# Patient Record
Sex: Female | Born: 1945 | Race: Black or African American | Hispanic: No | Marital: Married | Smoking: Never smoker
Health system: Southern US, Community
[De-identification: ages and names within clinical notes are randomized; demographics above are authoritative.]

## PROBLEM LIST (undated history)

## (undated) DIAGNOSIS — B0229 Other postherpetic nervous system involvement: Secondary | ICD-10-CM

## (undated) DIAGNOSIS — E785 Hyperlipidemia, unspecified: Secondary | ICD-10-CM

## (undated) DIAGNOSIS — Z7902 Long term (current) use of antithrombotics/antiplatelets: Secondary | ICD-10-CM

## (undated) DIAGNOSIS — N183 Chronic kidney disease, stage 3 unspecified: Secondary | ICD-10-CM

## (undated) DIAGNOSIS — M1990 Unspecified osteoarthritis, unspecified site: Secondary | ICD-10-CM

## (undated) DIAGNOSIS — K449 Diaphragmatic hernia without obstruction or gangrene: Secondary | ICD-10-CM

## (undated) DIAGNOSIS — Z972 Presence of dental prosthetic device (complete) (partial): Secondary | ICD-10-CM

## (undated) DIAGNOSIS — I5030 Unspecified diastolic (congestive) heart failure: Secondary | ICD-10-CM

## (undated) DIAGNOSIS — E133399 Other specified diabetes mellitus with moderate nonproliferative diabetic retinopathy without macular edema, unspecified eye: Secondary | ICD-10-CM

## (undated) DIAGNOSIS — I429 Cardiomyopathy, unspecified: Secondary | ICD-10-CM

## (undated) DIAGNOSIS — K5792 Diverticulitis of intestine, part unspecified, without perforation or abscess without bleeding: Secondary | ICD-10-CM

## (undated) DIAGNOSIS — E1140 Type 2 diabetes mellitus with diabetic neuropathy, unspecified: Secondary | ICD-10-CM

## (undated) DIAGNOSIS — B029 Zoster without complications: Secondary | ICD-10-CM

## (undated) DIAGNOSIS — Z794 Long term (current) use of insulin: Secondary | ICD-10-CM

## (undated) DIAGNOSIS — D869 Sarcoidosis, unspecified: Secondary | ICD-10-CM

## (undated) DIAGNOSIS — K219 Gastro-esophageal reflux disease without esophagitis: Secondary | ICD-10-CM

## (undated) DIAGNOSIS — I10 Essential (primary) hypertension: Secondary | ICD-10-CM

## (undated) DIAGNOSIS — E119 Type 2 diabetes mellitus without complications: Secondary | ICD-10-CM

## (undated) DIAGNOSIS — D631 Anemia in chronic kidney disease: Secondary | ICD-10-CM

## (undated) DIAGNOSIS — N184 Chronic kidney disease, stage 4 (severe): Secondary | ICD-10-CM

## (undated) DIAGNOSIS — Z8719 Personal history of other diseases of the digestive system: Secondary | ICD-10-CM

## (undated) DIAGNOSIS — I639 Cerebral infarction, unspecified: Secondary | ICD-10-CM

## (undated) HISTORY — DX: Essential (primary) hypertension: I10

## (undated) HISTORY — DX: Chronic kidney disease, stage 3 (moderate): N18.3

## (undated) HISTORY — DX: Unspecified diastolic (congestive) heart failure: I50.30

## (undated) HISTORY — DX: Chronic kidney disease, stage 3 unspecified: N18.30

## (undated) HISTORY — DX: Cerebral infarction, unspecified: I63.9

## (undated) HISTORY — DX: Hyperlipidemia, unspecified: E78.5

## (undated) HISTORY — PX: EYE SURGERY: SHX253

## (undated) HISTORY — PX: TUBAL LIGATION: SHX77

## (undated) HISTORY — DX: Type 2 diabetes mellitus without complications: E11.9

---

## 2006-12-15 HISTORY — PX: COLONOSCOPY: SHX174

## 2011-05-21 ENCOUNTER — Ambulatory Visit: Payer: Self-pay | Admitting: Family Medicine

## 2011-05-28 ENCOUNTER — Ambulatory Visit: Payer: Self-pay | Admitting: Family Medicine

## 2011-12-24 ENCOUNTER — Ambulatory Visit: Payer: Self-pay | Admitting: Family Medicine

## 2012-01-19 ENCOUNTER — Ambulatory Visit: Payer: Self-pay | Admitting: Family Medicine

## 2012-10-31 ENCOUNTER — Ambulatory Visit: Payer: Self-pay | Admitting: Physician Assistant

## 2012-10-31 LAB — URINALYSIS, COMPLETE
Bilirubin,UR: NEGATIVE
Blood: NEGATIVE
Ketone: NEGATIVE
Leukocyte Esterase: NEGATIVE
Nitrite: NEGATIVE
Ph: 5
RBC,UR: NONE SEEN /HPF
Specific Gravity: 1.015

## 2012-11-01 LAB — URINE CULTURE

## 2014-03-13 ENCOUNTER — Ambulatory Visit: Payer: Self-pay

## 2014-03-13 DIAGNOSIS — R0602 Shortness of breath: Secondary | ICD-10-CM | POA: Diagnosis not present

## 2014-03-17 ENCOUNTER — Ambulatory Visit: Payer: Self-pay

## 2014-06-05 DIAGNOSIS — N183 Chronic kidney disease, stage 3 unspecified: Secondary | ICD-10-CM | POA: Diagnosis not present

## 2014-06-05 DIAGNOSIS — R319 Hematuria, unspecified: Secondary | ICD-10-CM | POA: Diagnosis not present

## 2014-06-05 DIAGNOSIS — E119 Type 2 diabetes mellitus without complications: Secondary | ICD-10-CM | POA: Diagnosis not present

## 2014-06-05 DIAGNOSIS — N039 Chronic nephritic syndrome with unspecified morphologic changes: Secondary | ICD-10-CM | POA: Diagnosis not present

## 2014-06-05 DIAGNOSIS — D631 Anemia in chronic kidney disease: Secondary | ICD-10-CM | POA: Diagnosis not present

## 2014-06-05 DIAGNOSIS — I10 Essential (primary) hypertension: Secondary | ICD-10-CM | POA: Diagnosis not present

## 2014-06-05 DIAGNOSIS — R809 Proteinuria, unspecified: Secondary | ICD-10-CM | POA: Diagnosis not present

## 2014-06-15 DIAGNOSIS — N183 Chronic kidney disease, stage 3 unspecified: Secondary | ICD-10-CM | POA: Diagnosis not present

## 2014-06-22 DIAGNOSIS — E1139 Type 2 diabetes mellitus with other diabetic ophthalmic complication: Secondary | ICD-10-CM | POA: Diagnosis not present

## 2014-07-20 DIAGNOSIS — IMO0001 Reserved for inherently not codable concepts without codable children: Secondary | ICD-10-CM | POA: Diagnosis not present

## 2014-07-20 DIAGNOSIS — N289 Disorder of kidney and ureter, unspecified: Secondary | ICD-10-CM | POA: Diagnosis not present

## 2014-08-04 DIAGNOSIS — N058 Unspecified nephritic syndrome with other morphologic changes: Secondary | ICD-10-CM | POA: Diagnosis not present

## 2014-08-04 DIAGNOSIS — E1129 Type 2 diabetes mellitus with other diabetic kidney complication: Secondary | ICD-10-CM | POA: Diagnosis not present

## 2014-08-04 DIAGNOSIS — R809 Proteinuria, unspecified: Secondary | ICD-10-CM | POA: Diagnosis not present

## 2014-08-04 DIAGNOSIS — I10 Essential (primary) hypertension: Secondary | ICD-10-CM | POA: Diagnosis not present

## 2014-08-04 DIAGNOSIS — E1165 Type 2 diabetes mellitus with hyperglycemia: Secondary | ICD-10-CM | POA: Diagnosis not present

## 2015-05-01 DIAGNOSIS — N184 Chronic kidney disease, stage 4 (severe): Secondary | ICD-10-CM | POA: Insufficient documentation

## 2015-05-01 DIAGNOSIS — E1169 Type 2 diabetes mellitus with other specified complication: Secondary | ICD-10-CM | POA: Insufficient documentation

## 2015-05-01 DIAGNOSIS — E785 Hyperlipidemia, unspecified: Secondary | ICD-10-CM

## 2015-05-01 DIAGNOSIS — E1122 Type 2 diabetes mellitus with diabetic chronic kidney disease: Secondary | ICD-10-CM | POA: Diagnosis not present

## 2015-05-01 DIAGNOSIS — Z794 Long term (current) use of insulin: Secondary | ICD-10-CM | POA: Insufficient documentation

## 2015-05-01 DIAGNOSIS — I129 Hypertensive chronic kidney disease with stage 1 through stage 4 chronic kidney disease, or unspecified chronic kidney disease: Secondary | ICD-10-CM | POA: Diagnosis not present

## 2015-05-01 DIAGNOSIS — E1165 Type 2 diabetes mellitus with hyperglycemia: Principal | ICD-10-CM

## 2015-05-01 DIAGNOSIS — E1140 Type 2 diabetes mellitus with diabetic neuropathy, unspecified: Secondary | ICD-10-CM

## 2015-05-07 DIAGNOSIS — E1122 Type 2 diabetes mellitus with diabetic chronic kidney disease: Secondary | ICD-10-CM | POA: Diagnosis not present

## 2015-05-07 DIAGNOSIS — R809 Proteinuria, unspecified: Secondary | ICD-10-CM | POA: Diagnosis not present

## 2015-05-07 DIAGNOSIS — I129 Hypertensive chronic kidney disease with stage 1 through stage 4 chronic kidney disease, or unspecified chronic kidney disease: Secondary | ICD-10-CM | POA: Diagnosis not present

## 2015-05-07 DIAGNOSIS — N183 Chronic kidney disease, stage 3 (moderate): Secondary | ICD-10-CM | POA: Diagnosis not present

## 2015-05-15 DIAGNOSIS — N183 Chronic kidney disease, stage 3 (moderate): Secondary | ICD-10-CM | POA: Diagnosis not present

## 2015-05-15 DIAGNOSIS — E1129 Type 2 diabetes mellitus with other diabetic kidney complication: Secondary | ICD-10-CM | POA: Diagnosis not present

## 2015-05-15 DIAGNOSIS — R809 Proteinuria, unspecified: Secondary | ICD-10-CM | POA: Diagnosis not present

## 2015-05-15 DIAGNOSIS — I10 Essential (primary) hypertension: Secondary | ICD-10-CM | POA: Diagnosis not present

## 2015-07-16 DIAGNOSIS — R809 Proteinuria, unspecified: Secondary | ICD-10-CM | POA: Diagnosis not present

## 2015-07-16 DIAGNOSIS — I129 Hypertensive chronic kidney disease with stage 1 through stage 4 chronic kidney disease, or unspecified chronic kidney disease: Secondary | ICD-10-CM | POA: Diagnosis not present

## 2015-07-16 DIAGNOSIS — N183 Chronic kidney disease, stage 3 (moderate): Secondary | ICD-10-CM | POA: Diagnosis not present

## 2015-08-07 DIAGNOSIS — D631 Anemia in chronic kidney disease: Secondary | ICD-10-CM | POA: Diagnosis not present

## 2015-08-07 DIAGNOSIS — R809 Proteinuria, unspecified: Secondary | ICD-10-CM | POA: Diagnosis not present

## 2015-08-07 DIAGNOSIS — N2581 Secondary hyperparathyroidism of renal origin: Secondary | ICD-10-CM | POA: Diagnosis not present

## 2015-08-07 DIAGNOSIS — I10 Essential (primary) hypertension: Secondary | ICD-10-CM | POA: Diagnosis not present

## 2015-08-07 DIAGNOSIS — N183 Chronic kidney disease, stage 3 (moderate): Secondary | ICD-10-CM | POA: Diagnosis not present

## 2015-08-07 DIAGNOSIS — E1122 Type 2 diabetes mellitus with diabetic chronic kidney disease: Secondary | ICD-10-CM | POA: Diagnosis not present

## 2015-08-15 DIAGNOSIS — Z9114 Patient's other noncompliance with medication regimen: Secondary | ICD-10-CM | POA: Diagnosis not present

## 2015-08-15 DIAGNOSIS — N183 Chronic kidney disease, stage 3 (moderate): Secondary | ICD-10-CM | POA: Diagnosis not present

## 2015-08-15 DIAGNOSIS — E1129 Type 2 diabetes mellitus with other diabetic kidney complication: Secondary | ICD-10-CM | POA: Diagnosis not present

## 2015-08-15 DIAGNOSIS — E1165 Type 2 diabetes mellitus with hyperglycemia: Secondary | ICD-10-CM | POA: Diagnosis not present

## 2015-08-21 ENCOUNTER — Encounter: Payer: Self-pay | Admitting: Unknown Physician Specialty

## 2015-08-21 ENCOUNTER — Ambulatory Visit (INDEPENDENT_AMBULATORY_CARE_PROVIDER_SITE_OTHER): Payer: Medicare Other | Admitting: Unknown Physician Specialty

## 2015-08-21 VITALS — BP 139/85 | HR 76 | Temp 98.1°F | Ht 65.5 in | Wt 153.4 lb

## 2015-08-21 DIAGNOSIS — N183 Chronic kidney disease, stage 3 (moderate): Secondary | ICD-10-CM

## 2015-08-21 DIAGNOSIS — N189 Chronic kidney disease, unspecified: Secondary | ICD-10-CM | POA: Diagnosis not present

## 2015-08-21 DIAGNOSIS — E1165 Type 2 diabetes mellitus with hyperglycemia: Secondary | ICD-10-CM

## 2015-08-21 DIAGNOSIS — N184 Chronic kidney disease, stage 4 (severe): Secondary | ICD-10-CM | POA: Diagnosis not present

## 2015-08-21 DIAGNOSIS — N185 Chronic kidney disease, stage 5: Secondary | ICD-10-CM

## 2015-08-21 DIAGNOSIS — F419 Anxiety disorder, unspecified: Principal | ICD-10-CM

## 2015-08-21 DIAGNOSIS — F418 Other specified anxiety disorders: Secondary | ICD-10-CM

## 2015-08-21 DIAGNOSIS — IMO0002 Reserved for concepts with insufficient information to code with codable children: Secondary | ICD-10-CM

## 2015-08-21 DIAGNOSIS — N181 Chronic kidney disease, stage 1: Secondary | ICD-10-CM | POA: Diagnosis not present

## 2015-08-21 DIAGNOSIS — M545 Low back pain, unspecified: Secondary | ICD-10-CM

## 2015-08-21 DIAGNOSIS — F329 Major depressive disorder, single episode, unspecified: Secondary | ICD-10-CM

## 2015-08-21 DIAGNOSIS — F32A Depression, unspecified: Secondary | ICD-10-CM | POA: Insufficient documentation

## 2015-08-21 DIAGNOSIS — N182 Chronic kidney disease, stage 2 (mild): Secondary | ICD-10-CM | POA: Diagnosis not present

## 2015-08-21 DIAGNOSIS — I129 Hypertensive chronic kidney disease with stage 1 through stage 4 chronic kidney disease, or unspecified chronic kidney disease: Secondary | ICD-10-CM | POA: Insufficient documentation

## 2015-08-21 NOTE — Assessment & Plan Note (Signed)
Has returned to endocrinology for management.

## 2015-08-21 NOTE — Assessment & Plan Note (Signed)
Stable at present.   

## 2015-08-21 NOTE — Assessment & Plan Note (Signed)
Followed by Nephrology. Postive proteinuria. Labs pending.

## 2015-08-21 NOTE — Progress Notes (Signed)
BP 139/85 mmHg  Pulse 76  Temp(Src) 98.1 F (36.7 C)  Ht 5' 5.5" (1.664 m)  Wt 153 lb 6.4 oz (69.582 kg)  BMI 25.13 kg/m2  SpO2 97%  LMP  (LMP Unknown)   Subjective:    Patient ID: Tonya [name], female    DOB: Jan 01, 1946, 69 y.o.   MRN: [id number]  HPI: Tonya [name] is a 69 y.o. female  Chief Complaint  Patient presents with  . Diabetes  . Hyperlipidemia  . Hypertension  . Chronic Kidney Disease   Diabetes: Patient has returned to her endocrinologist for management. She was out of insulin prior to seeing Endocrinology therefore a A1C was not drawn at that time. She returns to them in one month. She does not monitor blood sugars at home.   Hypertension/Hyperlipidemia: Blood pressure well controlled. Taking medication as prescribed. Does not monitor blood pressure at home. Denies chest pain, headaches or shortness of breath.  Chronic Kidney Disease: She was seen by nephrology about 3-4 weeks ago. Per patient labs are improved, GFR at 57.   Relevant past medical, surgical, family and social history reviewed and updated as indicated. Interim medical history since our last visit reviewed. Allergies and medications reviewed and updated.  Review of Systems  Constitutional: Negative.  Negative for activity change and appetite change.  HENT: Negative.   Respiratory: Negative.  Negative for cough, chest tightness, shortness of breath, wheezing and stridor.   Cardiovascular: Negative.  Negative for chest pain, palpitations and leg swelling.  Skin: Negative.  Negative for color change, pallor, rash and wound.  Psychiatric/Behavioral: Negative for confusion. The patient is not nervous/anxious.     Per HPI unless specifically indicated above     Objective:    BP 139/85 mmHg  Pulse 76  Temp(Src) 98.1 F (36.7 C)  Ht 5' 5.5" (1.664 m)  Wt 153 lb 6.4 oz (69.582 kg)  BMI 25.13 kg/m2  SpO2 97%  LMP  (LMP Unknown)  Wt Readings from Last 3 Encounters:  08/21/15 153 lb 6.4  oz (69.582 kg)  05/01/15 153 lb (69.4 kg)    Physical Exam  Constitutional: She is oriented to person, place, and time. She appears well-developed and well-nourished. No distress.  HENT:  Head: Normocephalic and atraumatic.  Neck: Normal range of motion.  Cardiovascular: Normal rate, regular rhythm and normal heart sounds.  Exam reveals no gallop and no friction rub.   No murmur heard. Pulmonary/Chest: Effort normal and breath sounds normal. No respiratory distress. She has no wheezes. She has no rales. She exhibits no tenderness.  Neurological: She is alert and oriented to person, place, and time.  Skin: Skin is warm and dry. No rash noted. She is not diaphoretic. No erythema. No pallor.  Psychiatric: She has a normal mood and affect. Her behavior is normal. Judgment and thought content normal.        Assessment & Plan:   Problem List Items Addressed This Visit      Unprioritized   Diabetes mellitus type 2, uncontrolled    Has returned to endocrinology for management.      Relevant Medications   valsartan (DIOVAN) 160 MG tablet   aspirin 81 MG tablet   pravastatin (PRAVACHOL) 40 MG tablet   insulin lispro (HUMALOG) 100 UNIT/ML KiwkPen   Insulin Glargine (TOUJEO SOLOSTAR) 300 UNIT/ML SOPN   Anxiety and depression - Primary    Stable at present.      Hypertensive CKD (chronic kidney disease)    Followed  by Nephrology. Postive proteinuria. Labs pending.       Other Visit Diagnoses    Left-sided low back pain without sciatica        Will send for x-ray and pain management    Relevant Medications    aspirin 81 MG tablet    Other Relevant Orders    DG Lumbar Spine Complete    Ambulatory referral to Pain Clinic        Follow up plan: Return in about 3 months (around 11/20/2015).

## 2015-08-23 ENCOUNTER — Ambulatory Visit
Admission: RE | Admit: 2015-08-23 | Discharge: 2015-08-23 | Disposition: A | Discharge status: Home / Self Care | Payer: Medicare Other | Source: Ambulatory Visit | Attending: Unknown Physician Specialty | Admitting: Unknown Physician Specialty

## 2015-08-23 DIAGNOSIS — M47816 Spondylosis without myelopathy or radiculopathy, lumbar region: Secondary | ICD-10-CM | POA: Diagnosis not present

## 2015-08-23 DIAGNOSIS — I700 Atherosclerosis of aorta: Secondary | ICD-10-CM | POA: Diagnosis not present

## 2015-08-23 DIAGNOSIS — M545 Low back pain, unspecified: Secondary | ICD-10-CM

## 2015-08-23 DIAGNOSIS — K5900 Constipation, unspecified: Secondary | ICD-10-CM | POA: Diagnosis not present

## 2015-08-24 ENCOUNTER — Encounter: Payer: Self-pay | Admitting: Unknown Physician Specialty

## 2015-09-18 ENCOUNTER — Encounter: Payer: Self-pay | Admitting: Unknown Physician Specialty

## 2015-09-18 ENCOUNTER — Ambulatory Visit (INDEPENDENT_AMBULATORY_CARE_PROVIDER_SITE_OTHER): Payer: Medicare Other | Admitting: Unknown Physician Specialty

## 2015-09-18 VITALS — BP 165/89 | HR 68 | Temp 98.2°F | Ht 65.2 in | Wt 150.6 lb

## 2015-09-18 DIAGNOSIS — Z23 Encounter for immunization: Secondary | ICD-10-CM | POA: Diagnosis not present

## 2015-09-18 DIAGNOSIS — M7072 Other bursitis of hip, left hip: Secondary | ICD-10-CM

## 2015-09-18 DIAGNOSIS — M7070 Other bursitis of hip, unspecified hip: Secondary | ICD-10-CM | POA: Insufficient documentation

## 2015-09-18 MED ORDER — LIDOCAINE 5 % EX OINT: 1.0000 "application " | TOPICAL_OINTMENT | CUTANEOUS | Status: DC | PRN

## 2015-09-18 NOTE — Patient Instructions (Addendum)
Hip Bursitis Bursitis is a puffiness (swelling) and soreness of a fluid-filled sac (bursa). This sac covers and protects the joint. HOME CARE  Put ice on the injured area.  Put ice in a plastic bag.  Place a towel between your skin and the bag.  Leave the ice on for 15-20 minutes, 03-04 times a day.  Rest the painful joint as much as possible. Move your joint at least 4 times a day. When pain lessens, start normal, slow movements and normal activities.  Only take medicine as told by your doctor.  Use crutches as told.  Raise (elevate) your painful joint. Use pillows for propping your legs and hips.  Get a massage to lessen pain. GET HELP RIGHT AWAY IF:  Your pain increases or does not improve during treatment.  You have a fever.  You feel heat coming from the affected area.  You see redness and puffiness around the affected area.  You have any questions or concerns. MAKE SURE YOU:  Understand these instructions.  Will watch your condition.  Will get help right away if you are not well or get worse. Document Released: 01/03/2011 Document Revised: 02/23/2012 Document Reviewed: 01/03/2011 [facility] Patient Information 2015 [place], [place]. This information is not intended to replace advice given to you by your health care provider. Make sure you discuss any questions you have with your health care provider.

## 2015-09-18 NOTE — Progress Notes (Signed)
BP 165/89 mmHg  Pulse 68  Temp(Src) 98.2 F (36.8 C)  Ht 5' 5.2" (1.656 m)  Wt 150 lb 9.6 oz (68.312 kg)  BMI 24.91 kg/m2  SpO2 100%  LMP  (LMP Unknown)   Subjective:    Patient ID: Tonya [name], female    DOB: 1945/12/17, 69 y.o.   MRN: [id number]  HPI: CIYA FRANCKOWIAK is a 69 y.o. female  Chief Complaint  Patient presents with  . Leg Pain    pt states she has had pain in left leg for about 3 weeks now, states pain is like a burning pain. states it is causing her to not be able to sleep at night.   Hip Bursitis: Pain began about three weeks ago to left hip and radiating down anterior thigh. She can not lay on left side at all. Has tried aspirin 81mg  about a week ago and has been using a topical over the counter pain relief cream with some relief. Pain is slightly improving but still significant discomfort is present. She has similar pain to the right side about one month ago and was sent for a lumbar xray which showed degenerative changes. The right side still hurts but is much better today.    Relevant past medical, surgical, family and social history reviewed and updated as indicated. Interim medical history since our last visit reviewed. Allergies and medications reviewed and updated.  Review of Systems  Constitutional: Negative.  Negative for fever, chills, activity change and appetite change.  HENT: Negative.  Negative for congestion, postnasal drip, rhinorrhea and sore throat.   Eyes: Negative.  Negative for discharge and redness.  Respiratory: Negative.  Negative for cough, chest tightness, shortness of breath and wheezing.   Cardiovascular: Negative.  Negative for chest pain, palpitations and leg swelling.  Gastrointestinal: Negative.  Negative for diarrhea and constipation.  Musculoskeletal: Positive for back pain and arthralgias. Negative for gait problem and neck pain.  Skin: Negative.  Negative for color change, pallor, rash and wound.  Neurological: Negative for  dizziness, seizures, weakness, light-headedness and headaches.  Psychiatric/Behavioral: Negative.  Negative for confusion, sleep disturbance and agitation. The patient is not nervous/anxious.     Per HPI unless specifically indicated above     Objective:    BP 165/89 mmHg  Pulse 68  Temp(Src) 98.2 F (36.8 C)  Ht 5' 5.2" (1.656 m)  Wt 150 lb 9.6 oz (68.312 kg)  BMI 24.91 kg/m2  SpO2 100%  LMP  (LMP Unknown)  Wt Readings from Last 3 Encounters:  09/18/15 150 lb 9.6 oz (68.312 kg)  08/21/15 153 lb 6.4 oz (69.582 kg)  05/01/15 153 lb (69.4 kg)    Physical Exam  Constitutional: She is oriented to person, place, and time. She appears well-developed and well-nourished. No distress.  HENT:  Head: Normocephalic and atraumatic.  Eyes: Conjunctivae are normal. Right eye exhibits no discharge. Left eye exhibits no discharge.  Neck: Normal range of motion.  Cardiovascular: Normal rate, regular rhythm and normal heart sounds.  Exam reveals no gallop and no friction rub.   No murmur heard. Pulmonary/Chest: Effort normal and breath sounds normal.  Musculoskeletal: Normal range of motion. She exhibits no edema or tenderness.  Neurological: She is alert and oriented to person, place, and time.  Skin: She is not diaphoretic.        Assessment & Plan:   Problem List Items Addressed This Visit      Unprioritized   Hip bursitis  Other Visit Diagnoses    Immunization due    -  Primary    Relevant Orders    Flu Vaccine QUAD 36+ mos PF IM (Fluarix & Fluzone Quad PF) (Completed)        Follow up plan: Return in about 4 weeks (around 10/16/2015) for Physical.

## 2015-10-26 ENCOUNTER — Encounter: Payer: Medicare Other | Admitting: Unknown Physician Specialty

## 2015-10-31 DIAGNOSIS — R809 Proteinuria, unspecified: Secondary | ICD-10-CM | POA: Diagnosis not present

## 2015-10-31 DIAGNOSIS — E1129 Type 2 diabetes mellitus with other diabetic kidney complication: Secondary | ICD-10-CM | POA: Diagnosis not present

## 2015-10-31 DIAGNOSIS — N183 Chronic kidney disease, stage 3 (moderate): Secondary | ICD-10-CM | POA: Diagnosis not present

## 2015-10-31 DIAGNOSIS — I10 Essential (primary) hypertension: Secondary | ICD-10-CM | POA: Diagnosis not present

## 2015-10-31 DIAGNOSIS — E1165 Type 2 diabetes mellitus with hyperglycemia: Secondary | ICD-10-CM | POA: Diagnosis not present

## 2015-11-06 DIAGNOSIS — I129 Hypertensive chronic kidney disease with stage 1 through stage 4 chronic kidney disease, or unspecified chronic kidney disease: Secondary | ICD-10-CM | POA: Diagnosis not present

## 2015-11-06 DIAGNOSIS — N183 Chronic kidney disease, stage 3 (moderate): Secondary | ICD-10-CM | POA: Diagnosis not present

## 2015-11-06 DIAGNOSIS — N2581 Secondary hyperparathyroidism of renal origin: Secondary | ICD-10-CM | POA: Diagnosis not present

## 2015-11-06 DIAGNOSIS — R809 Proteinuria, unspecified: Secondary | ICD-10-CM | POA: Diagnosis not present

## 2015-11-06 DIAGNOSIS — E1122 Type 2 diabetes mellitus with diabetic chronic kidney disease: Secondary | ICD-10-CM | POA: Diagnosis not present

## 2015-11-07 DIAGNOSIS — I10 Essential (primary) hypertension: Secondary | ICD-10-CM | POA: Diagnosis not present

## 2015-11-07 DIAGNOSIS — E1122 Type 2 diabetes mellitus with diabetic chronic kidney disease: Secondary | ICD-10-CM | POA: Diagnosis not present

## 2015-11-07 DIAGNOSIS — Z794 Long term (current) use of insulin: Secondary | ICD-10-CM | POA: Diagnosis not present

## 2015-11-07 DIAGNOSIS — E1165 Type 2 diabetes mellitus with hyperglycemia: Secondary | ICD-10-CM | POA: Diagnosis not present

## 2015-11-07 DIAGNOSIS — N183 Chronic kidney disease, stage 3 (moderate): Secondary | ICD-10-CM | POA: Diagnosis not present

## 2015-11-07 DIAGNOSIS — Z9114 Patient's other noncompliance with medication regimen: Secondary | ICD-10-CM | POA: Diagnosis not present

## 2015-11-21 ENCOUNTER — Ambulatory Visit (INDEPENDENT_AMBULATORY_CARE_PROVIDER_SITE_OTHER): Payer: Medicare Other | Admitting: Unknown Physician Specialty

## 2015-11-21 ENCOUNTER — Encounter: Payer: Self-pay | Admitting: Unknown Physician Specialty

## 2015-11-21 VITALS — BP 154/87 | HR 71 | Temp 97.5°F | Ht 66.2 in | Wt 151.4 lb

## 2015-11-21 DIAGNOSIS — M609 Myositis, unspecified: Secondary | ICD-10-CM | POA: Diagnosis not present

## 2015-11-21 DIAGNOSIS — M791 Myalgia: Secondary | ICD-10-CM

## 2015-11-21 DIAGNOSIS — Z794 Long term (current) use of insulin: Secondary | ICD-10-CM

## 2015-11-21 DIAGNOSIS — E1140 Type 2 diabetes mellitus with diabetic neuropathy, unspecified: Secondary | ICD-10-CM

## 2015-11-21 DIAGNOSIS — IMO0001 Reserved for inherently not codable concepts without codable children: Secondary | ICD-10-CM

## 2015-11-21 DIAGNOSIS — E1165 Type 2 diabetes mellitus with hyperglycemia: Secondary | ICD-10-CM | POA: Diagnosis not present

## 2015-11-21 DIAGNOSIS — I129 Hypertensive chronic kidney disease with stage 1 through stage 4 chronic kidney disease, or unspecified chronic kidney disease: Secondary | ICD-10-CM

## 2015-11-21 MED ORDER — LIDOCAINE 5 % EX OINT: 1.0000 "application " | TOPICAL_OINTMENT | CUTANEOUS | Status: DC | PRN

## 2015-11-21 MED ORDER — INSULIN PEN NEEDLE 30G X 8 MM MISC: 1.0000 | Freq: Three times a day (TID) | Status: DC

## 2015-11-21 NOTE — Progress Notes (Signed)
   BP 154/87 mmHg  Pulse 71  Temp(Src) 97.5 F (36.4 C)  Ht 5' 6.2" (1.681 m)  Wt 151 lb 6.4 oz (68.675 kg)  BMI 24.30 kg/m2  SpO2 95%  LMP  (LMP Unknown)   Subjective:    Patient ID: Tonya [name], female    DOB: 01/07/1946, 69 y.o.   MRN: [id number]  HPI: Tonya [name] is a 69 y.o. female  Chief Complaint  Patient presents with  . Diabetes  . Hyperlipidemia  . Hypertension  . Medication Refill    pt states she needs a refill on novofine needles and lidocaine ointment   Diabetes:  Seeing Endocrinology eye exam within last year  Hypertension:  Using medications without difficulty Average home BPs: Not checking   Using medication without problems or lightheadedness No chest pain with exertion or shortness of breath No Edema Seeing nephrology for associated CKD.  Plans on seeing them back for BP recheck.    Elevated Cholesterol: Using medications without problems: No Muscle aches:  Diet compliance: Exercise  Myalgias Persistant myalgias that she rubs Lidoderm ointment on.  She took one of her husband's pills which helped but unsure what it was.    Relevant past medical, surgical, family and social history reviewed and updated as indicated. Interim medical history since our last visit reviewed. Allergies and medications reviewed and updated.  Review of Systems  Per HPI unless specifically indicated above     Objective:    BP 154/87 mmHg  Pulse 71  Temp(Src) 97.5 F (36.4 C)  Ht 5' 6.2" (1.681 m)  Wt 151 lb 6.4 oz (68.675 kg)  BMI 24.30 kg/m2  SpO2 95%  LMP  (LMP Unknown)  Wt Readings from Last 3 Encounters:  11/21/15 151 lb 6.4 oz (68.675 kg)  09/18/15 150 lb 9.6 oz (68.312 kg)  08/21/15 153 lb 6.4 oz (69.582 kg)    Physical Exam  Constitutional: She is oriented to person, place, and time. She appears well-developed and well-nourished. No distress.  HENT:  Head: Normocephalic and atraumatic.  Eyes: Conjunctivae and lids are normal. Right eye  exhibits no discharge. Left eye exhibits no discharge. No scleral icterus.  Neck: Normal range of motion. Neck supple. No JVD present. Carotid bruit is not present.  Cardiovascular: Normal rate, regular rhythm and normal heart sounds.   Pulmonary/Chest: Effort normal and breath sounds normal.  Abdominal: Normal appearance. There is no splenomegaly or hepatomegaly.  Musculoskeletal: Normal range of motion.  Neurological: She is alert and oriented to person, place, and time.  Skin: Skin is warm, dry and intact. No rash noted. No pallor.  Psychiatric: She has a normal mood and affect. Her behavior is normal. Judgment and thought content normal.    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  Refill needles      Encounter for long-term (current) use of insulin ([place])    Per endocrine      Hypertensive CKD (chronic kidney disease)    Per nephrology      Myalgia and myositis    Pt with CKD.  Will continue with topical treatment          Follow up plan: Return in about 6 months (around 05/21/2016).

## 2015-11-21 NOTE — Assessment & Plan Note (Addendum)
Pt with CKD.  Will continue with topical treatment

## 2015-11-21 NOTE — Assessment & Plan Note (Signed)
Per endocrine.  Refill needles

## 2015-11-21 NOTE — Assessment & Plan Note (Signed)
Per endocrine  

## 2015-11-21 NOTE — Assessment & Plan Note (Signed)
Per nephrology 

## 2016-01-09 ENCOUNTER — Other Ambulatory Visit: Payer: Self-pay | Admitting: Unknown Physician Specialty

## 2016-02-08 DIAGNOSIS — Z794 Long term (current) use of insulin: Secondary | ICD-10-CM | POA: Diagnosis not present

## 2016-02-08 DIAGNOSIS — E1165 Type 2 diabetes mellitus with hyperglycemia: Secondary | ICD-10-CM | POA: Diagnosis not present

## 2016-02-11 DIAGNOSIS — E1165 Type 2 diabetes mellitus with hyperglycemia: Secondary | ICD-10-CM | POA: Diagnosis not present

## 2016-02-11 DIAGNOSIS — E782 Mixed hyperlipidemia: Secondary | ICD-10-CM | POA: Diagnosis not present

## 2016-02-11 DIAGNOSIS — Z794 Long term (current) use of insulin: Secondary | ICD-10-CM | POA: Diagnosis not present

## 2016-02-11 DIAGNOSIS — E1122 Type 2 diabetes mellitus with diabetic chronic kidney disease: Secondary | ICD-10-CM | POA: Diagnosis not present

## 2016-02-11 DIAGNOSIS — I10 Essential (primary) hypertension: Secondary | ICD-10-CM | POA: Diagnosis not present

## 2016-02-11 DIAGNOSIS — N183 Chronic kidney disease, stage 3 (moderate): Secondary | ICD-10-CM | POA: Diagnosis not present

## 2016-03-06 ENCOUNTER — Other Ambulatory Visit: Payer: Self-pay | Admitting: Unknown Physician Specialty

## 2016-03-13 DIAGNOSIS — L0231 Cutaneous abscess of buttock: Secondary | ICD-10-CM | POA: Diagnosis not present

## 2016-05-06 DIAGNOSIS — Z794 Long term (current) use of insulin: Secondary | ICD-10-CM | POA: Diagnosis not present

## 2016-05-06 DIAGNOSIS — E1165 Type 2 diabetes mellitus with hyperglycemia: Secondary | ICD-10-CM | POA: Diagnosis not present

## 2016-05-07 DIAGNOSIS — E113292 Type 2 diabetes mellitus with mild nonproliferative diabetic retinopathy without macular edema, left eye: Secondary | ICD-10-CM | POA: Diagnosis not present

## 2016-05-07 LAB — HM DIABETES EYE EXAM

## 2016-05-10 ENCOUNTER — Other Ambulatory Visit: Payer: Self-pay | Admitting: Unknown Physician Specialty

## 2016-05-13 DIAGNOSIS — Z9114 Patient's other noncompliance with medication regimen: Secondary | ICD-10-CM | POA: Diagnosis not present

## 2016-05-13 DIAGNOSIS — Z794 Long term (current) use of insulin: Secondary | ICD-10-CM | POA: Diagnosis not present

## 2016-05-13 DIAGNOSIS — I10 Essential (primary) hypertension: Secondary | ICD-10-CM | POA: Diagnosis not present

## 2016-05-13 DIAGNOSIS — E782 Mixed hyperlipidemia: Secondary | ICD-10-CM | POA: Diagnosis not present

## 2016-05-13 DIAGNOSIS — E1165 Type 2 diabetes mellitus with hyperglycemia: Secondary | ICD-10-CM | POA: Diagnosis not present

## 2016-05-21 ENCOUNTER — Ambulatory Visit: Payer: Medicare Other | Admitting: Unknown Physician Specialty

## 2016-07-02 ENCOUNTER — Other Ambulatory Visit: Payer: Self-pay | Admitting: Unknown Physician Specialty

## 2016-07-03 ENCOUNTER — Other Ambulatory Visit: Payer: Self-pay | Admitting: Unknown Physician Specialty

## 2016-08-06 DIAGNOSIS — I10 Essential (primary) hypertension: Secondary | ICD-10-CM | POA: Diagnosis not present

## 2016-08-06 DIAGNOSIS — E1165 Type 2 diabetes mellitus with hyperglycemia: Secondary | ICD-10-CM | POA: Diagnosis not present

## 2016-08-06 DIAGNOSIS — Z794 Long term (current) use of insulin: Secondary | ICD-10-CM | POA: Diagnosis not present

## 2016-08-06 LAB — HEMOGLOBIN A1C: HEMOGLOBIN A1C: 13.1

## 2016-08-19 ENCOUNTER — Telehealth: Payer: Self-pay | Admitting: Unknown Physician Specialty

## 2016-08-19 LAB — HM DIABETES EYE EXAM

## 2016-08-19 NOTE — Telephone Encounter (Signed)
Pt would like to know if she could get a sample for insulin lispro (HUMALOG) 100 UNIT/ML KiwkPen and Levemir

## 2016-08-19 NOTE — Telephone Encounter (Signed)
Called and spoke to patient because Levemir was not on current medication list. Patient stated that Dr. [name] put her on the levemir and that she was taking 20 units at bedtime. I told the patient that we did not have any samples of either of those medications. Patient was last seen in December of 2016 and was supposed to be seen in June 2017. So I got the patient an appointment scheduled for 09/10/16 and I told her that I would call her if we got any samples of the medications she was requesting. Will update patient's chart with levemir.

## 2016-08-20 DIAGNOSIS — E113311 Type 2 diabetes mellitus with moderate nonproliferative diabetic retinopathy with macular edema, right eye: Secondary | ICD-10-CM | POA: Diagnosis not present

## 2016-08-20 LAB — HM DIABETES EYE EXAM

## 2016-09-10 ENCOUNTER — Other Ambulatory Visit: Payer: Self-pay

## 2016-09-10 ENCOUNTER — Encounter: Payer: Self-pay | Admitting: Unknown Physician Specialty

## 2016-09-10 ENCOUNTER — Ambulatory Visit (INDEPENDENT_AMBULATORY_CARE_PROVIDER_SITE_OTHER): Payer: Medicare Other | Admitting: Unknown Physician Specialty

## 2016-09-10 VITALS — BP 189/95 | HR 65 | Temp 97.6°F | Ht 65.8 in | Wt 159.4 lb

## 2016-09-10 DIAGNOSIS — E1121 Type 2 diabetes mellitus with diabetic nephropathy: Secondary | ICD-10-CM | POA: Diagnosis not present

## 2016-09-10 DIAGNOSIS — Z794 Long term (current) use of insulin: Secondary | ICD-10-CM | POA: Diagnosis not present

## 2016-09-10 DIAGNOSIS — E1165 Type 2 diabetes mellitus with hyperglycemia: Secondary | ICD-10-CM | POA: Diagnosis not present

## 2016-09-10 DIAGNOSIS — N184 Chronic kidney disease, stage 4 (severe): Secondary | ICD-10-CM

## 2016-09-10 DIAGNOSIS — I129 Hypertensive chronic kidney disease with stage 1 through stage 4 chronic kidney disease, or unspecified chronic kidney disease: Secondary | ICD-10-CM

## 2016-09-10 DIAGNOSIS — Z23 Encounter for immunization: Secondary | ICD-10-CM

## 2016-09-10 DIAGNOSIS — E785 Hyperlipidemia, unspecified: Secondary | ICD-10-CM | POA: Diagnosis not present

## 2016-09-10 DIAGNOSIS — Z0000 Encounter for general adult medical examination without abnormal findings: Secondary | ICD-10-CM

## 2016-09-10 DIAGNOSIS — IMO0002 Reserved for concepts with insufficient information to code with codable children: Secondary | ICD-10-CM

## 2016-09-10 LAB — BAYER DCA HB A1C WAIVED: HB A1C: 13.3 % — AB (ref <7.0)

## 2016-09-10 LAB — HEMOGLOBIN A1C: Hemoglobin A1C: 13.3

## 2016-09-10 MED ORDER — VALSARTAN 160 MG PO TABS: 160.0000 mg | ORAL_TABLET | Freq: Every day | ORAL | 5 refills | Status: DC

## 2016-09-10 MED ORDER — PRAVASTATIN SODIUM 40 MG PO TABS: 40.0000 mg | ORAL_TABLET | Freq: Every day | ORAL | 5 refills | Status: DC

## 2016-09-10 MED ORDER — INSULIN DETEMIR 100 UNIT/ML FLEXPEN: 30.0000 [IU] | PEN_INJECTOR | Freq: Every day | SUBCUTANEOUS | 11 refills | Status: DC

## 2016-09-10 MED ORDER — OMEPRAZOLE 20 MG PO CPDR: 20.0000 mg | DELAYED_RELEASE_CAPSULE | Freq: Two times a day (BID) | ORAL | 5 refills | Status: DC

## 2016-09-10 MED ORDER — FUROSEMIDE 20 MG PO TABS: 20.0000 mg | ORAL_TABLET | Freq: Every day | ORAL | 5 refills | Status: DC

## 2016-09-10 NOTE — Progress Notes (Addendum)
BP (!) 189/95 (BP Location: Left Arm, Patient Position: Sitting, Cuff Size: Normal)   Pulse 65   Temp 97.6 F (36.4 C)   Ht 5' 5.8" (1.671 m)   Wt 159 lb 6.4 oz (72.3 kg)   LMP  (LMP Unknown)   SpO2 100%   BMI 25.88 kg/m    Subjective:    Patient ID: Tonya [name], female    DOB: 04-12-1946, 70 y.o.   MRN: [id number]  HPI: Tonya [name] is a 70 y.o. female  Chief Complaint  Patient presents with  . Diabetes    pt due for foot exam   . Hyperlipidemia  . Hypertension    pt states she has not been taking valsartan  . Gastroesophageal Reflux  . Neck Pain    pt states she has been having pain in her neck for a few weeks   . Labs Only    Hep C order entered    Diabetes Seeing Endocrine but missed last appointment.  Takes 20 u Toujeo or Levemir at night.  Takes 8-10 units of Novolog  Hypertension Not taking Valsartan.  Missed last appointment with Nephrology  GERD No complaints of symtoms  Neck pain States it used to crack and pop and was sore.  Improved with linament.     Relevant past medical, surgical, family and social history reviewed and updated as indicated. Interim medical history since our last visit reviewed. Allergies and medications reviewed and updated.  Review of Systems  Per HPI unless specifically indicated above     Objective:    BP (!) 189/95 (BP Location: Left Arm, Patient Position: Sitting, Cuff Size: Normal)   Pulse 65   Temp 97.6 F (36.4 C)   Ht 5' 5.8" (1.671 m)   Wt 159 lb 6.4 oz (72.3 kg)   LMP  (LMP Unknown)   SpO2 100%   BMI 25.88 kg/m   Wt Readings from Last 3 Encounters:  09/10/16 159 lb 6.4 oz (72.3 kg)  11/21/15 151 lb 6.4 oz (68.7 kg)  09/18/15 150 lb 9.6 oz (68.3 kg)    Physical Exam  Constitutional: She is oriented to person, place, and time. She appears well-developed and well-nourished. No distress.  HENT:  Head: Normocephalic and atraumatic.  Eyes: Conjunctivae and lids are normal. Right eye exhibits no  discharge. Left eye exhibits no discharge. No scleral icterus.  Neck: Normal range of motion. Neck supple. No JVD present. Carotid bruit is not present.  Cardiovascular: Normal rate, regular rhythm and normal heart sounds.   Pulmonary/Chest: Effort normal and breath sounds normal.  Abdominal: Normal appearance. There is no splenomegaly or hepatomegaly.  Musculoskeletal: Normal range of motion.  Neurological: She is alert and oriented to person, place, and time.  Skin: Skin is warm, dry and intact. No rash noted. No pallor.  Psychiatric: She has a normal mood and affect. Her behavior is normal. Judgment and thought content normal.    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  Will refill Toujeo and check Hgb A1C as lost to f/u      Relevant Medications   pravastatin (PRAVACHOL) 40 MG tablet   valsartan (DIOVAN) 160 MG tablet   Insulin Detemir (LEVEMIR) 100 UNIT/ML Pen   Other Relevant Orders   Comprehensive metabolic panel   Bayer DCA Hb A1c Waived   Ambulatory referral to Endocrinology   Hyperlipidemia   Relevant Medications   furosemide (LASIX) 20 MG tablet   pravastatin (PRAVACHOL) 40 MG tablet   valsartan (DIOVAN) 160 MG tablet   Other Relevant Orders   Lipid Panel w/o Chol/HDL Ratio   Hypertensive CKD (chronic kidney disease)    Very poor control.  Restart Valsartan.        Relevant Orders   Ambulatory referral to Nephrology    Other Visit Diagnoses    Health care maintenance    -   Primary   Relevant Orders   Hepatitis C antibody   Need for influenza vaccination       Relevant Orders   Flu vaccine HIGH DOSE PF (Completed)    Pt lost to f/u for Nephrology and Endocrine.  I will put in those referrals again  Follow up plan: Return in about 4 weeks (around 10/08/2016) for physical.

## 2016-09-10 NOTE — Assessment & Plan Note (Signed)
Very poor control.  Restart Valsartan.

## 2016-09-10 NOTE — Assessment & Plan Note (Addendum)
Encouraged close f/u with Endocrine.  Will refill Toujeo and check Hgb A1C as lost to f/u

## 2016-09-10 NOTE — Patient Instructions (Addendum)

## 2016-09-10 NOTE — Assessment & Plan Note (Signed)
Encouraged to f/u with Nephrology

## 2016-09-10 NOTE — Addendum Note (Signed)
Addended by: Kathrine Haddock on: 09/10/2016 11:26 AM   Modules accepted: Orders

## 2016-09-11 LAB — CBC WITH DIFFERENTIAL/PLATELET
BASOS ABS: 0 10*3/uL (ref 0.0–0.2)
Basos: 0 %
EOS (ABSOLUTE): 0.2 10*3/uL (ref 0.0–0.4)
Eos: 3 %
HEMOGLOBIN: 11.2 g/dL (ref 11.1–15.9)
Hematocrit: 33.3 % — ABNORMAL LOW (ref 34.0–46.6)
IMMATURE GRANULOCYTES: 0 %
Immature Grans (Abs): 0 10*3/uL (ref 0.0–0.1)
LYMPHS ABS: 3.1 10*3/uL (ref 0.7–3.1)
Lymphs: 34 %
MCH: 26.5 pg — ABNORMAL LOW (ref 26.6–33.0)
MCHC: 33.6 g/dL (ref 31.5–35.7)
MCV: 79 fL (ref 79–97)
MONOCYTES: 5 %
MONOS ABS: 0.5 10*3/uL (ref 0.1–0.9)
NEUTROS PCT: 58 %
Neutrophils Absolute: 5.2 10*3/uL (ref 1.4–7.0)
Platelets: 298 10*3/uL (ref 150–379)
RBC: 4.23 x10E6/uL (ref 3.77–5.28)
RDW: 15.1 % (ref 12.3–15.4)
WBC: 9 10*3/uL (ref 3.4–10.8)

## 2016-09-11 LAB — COMPREHENSIVE METABOLIC PANEL
ALK PHOS: 88 IU/L (ref 39–117)
ALT: 11 IU/L (ref 0–32)
AST: 32 IU/L (ref 0–40)
Albumin/Globulin Ratio: 1.4 (ref 1.2–2.2)
Albumin: 3.6 g/dL (ref 3.6–4.8)
BUN/Creatinine Ratio: 22 (ref 12–28)
BUN: 32 mg/dL — AB (ref 8–27)
Bilirubin Total: <0.2 mg/dL (ref 0.0–1.2)
CO2: 20 mmol/L (ref 18–29)
CREATININE: 1.47 mg/dL — AB (ref 0.57–1.00)
Calcium: 9.2 mg/dL (ref 8.7–10.3)
Chloride: 105 mmol/L (ref 96–106)
GFR calc Af Amer: 42 mL/min/{1.73_m2} — ABNORMAL LOW (ref >59)
GFR calc non Af Amer: 36 mL/min/{1.73_m2} — ABNORMAL LOW (ref >59)
GLUCOSE: 71 mg/dL (ref 65–99)
Globulin, Total: 2.6 g/dL (ref 1.5–4.5)
Potassium: 4.8 mmol/L (ref 3.5–5.2)
Sodium: 141 mmol/L (ref 134–144)
Total Protein: 6.2 g/dL (ref 6.0–8.5)

## 2016-09-11 LAB — LIPID PANEL W/O CHOL/HDL RATIO
CHOLESTEROL TOTAL: 239 mg/dL — AB (ref 100–199)
HDL: 58 mg/dL (ref >39)
LDL CALC: 134 mg/dL — AB (ref 0–99)
TRIGLYCERIDES: 236 mg/dL — AB (ref 0–149)
VLDL CHOLESTEROL CAL: 47 mg/dL — AB (ref 5–40)

## 2016-09-11 LAB — HEPATITIS C ANTIBODY

## 2016-09-12 ENCOUNTER — Encounter: Payer: Self-pay | Admitting: Unknown Physician Specialty

## 2016-10-10 ENCOUNTER — Encounter: Payer: Self-pay | Admitting: Unknown Physician Specialty

## 2016-10-10 ENCOUNTER — Ambulatory Visit (INDEPENDENT_AMBULATORY_CARE_PROVIDER_SITE_OTHER): Payer: Medicare Other | Admitting: Unknown Physician Specialty

## 2016-10-10 VITALS — BP 165/87 | HR 98 | Temp 97.7°F | Ht 65.0 in | Wt 162.2 lb

## 2016-10-10 DIAGNOSIS — B0229 Other postherpetic nervous system involvement: Secondary | ICD-10-CM | POA: Diagnosis not present

## 2016-10-10 DIAGNOSIS — Z1239 Encounter for other screening for malignant neoplasm of breast: Secondary | ICD-10-CM

## 2016-10-10 DIAGNOSIS — Z23 Encounter for immunization: Secondary | ICD-10-CM

## 2016-10-10 DIAGNOSIS — E1140 Type 2 diabetes mellitus with diabetic neuropathy, unspecified: Secondary | ICD-10-CM

## 2016-10-10 DIAGNOSIS — I129 Hypertensive chronic kidney disease with stage 1 through stage 4 chronic kidney disease, or unspecified chronic kidney disease: Secondary | ICD-10-CM | POA: Diagnosis not present

## 2016-10-10 DIAGNOSIS — E2839 Other primary ovarian failure: Secondary | ICD-10-CM

## 2016-10-10 DIAGNOSIS — E1165 Type 2 diabetes mellitus with hyperglycemia: Secondary | ICD-10-CM | POA: Diagnosis not present

## 2016-10-10 DIAGNOSIS — N184 Chronic kidney disease, stage 4 (severe): Secondary | ICD-10-CM

## 2016-10-10 MED ORDER — VALSARTAN 320 MG PO TABS: 320.0000 mg | ORAL_TABLET | Freq: Every day | ORAL | 3 refills | Status: DC

## 2016-10-10 NOTE — Assessment & Plan Note (Addendum)
Increase the Diovan.  BP is better

## 2016-10-10 NOTE — Assessment & Plan Note (Signed)
Appt with Dr. Abigail Butts.  Pt wants to start HCTZ and I willl defer that decision to him

## 2016-10-10 NOTE — Patient Instructions (Addendum)
Tdap Vaccine (Tetanus, Diphtheria and Pertussis): What You Need to Know 1. Why get vaccinated? Tetanus, diphtheria and pertussis are very serious diseases. Tdap vaccine can protect us from these diseases. And, Tdap vaccine given to pregnant women can protect newborn babies against pertussis. TETANUS (Lockjaw) is rare in the [place] today. It causes painful muscle tightening and stiffness, usually all over the body.  It can lead to tightening of muscles in the head and neck so you can't open your mouth, swallow, or sometimes even breathe. Tetanus kills about 1 out of 10 people who are infected even after receiving the best medical care. DIPHTHERIA is also rare in the [place] today. It can cause a thick coating to form in the back of the throat.  It can lead to breathing problems, heart failure, paralysis, and death. PERTUSSIS (Whooping Cough) causes severe coughing spells, which can cause difficulty breathing, vomiting and disturbed sleep.  It can also lead to weight loss, incontinence, and rib fractures. Up to 2 in 100 adolescents and 5 in 100 adults with pertussis are hospitalized or have complications, which could include pneumonia or death. These diseases are caused by bacteria. Diphtheria and pertussis are spread from person to person through secretions from coughing or sneezing. Tetanus enters the body through cuts, scratches, or wounds. Before vaccines, as many as 200,000 cases of diphtheria, 200,000 cases of pertussis, and hundreds of cases of tetanus, were reported in the [place] each year. Since vaccination began, reports of cases for tetanus and diphtheria have dropped by about 99% and for pertussis by about 80%. 2. Tdap vaccine Tdap vaccine can protect adolescents and adults from tetanus, diphtheria, and pertussis. One dose of Tdap is routinely given at age 11 or 12. People who did not get Tdap at that age should get it as soon as possible. Tdap is especially important  for healthcare professionals and anyone having close contact with a baby younger than 12 months. Pregnant women should get a dose of Tdap during every pregnancy, to protect the newborn from pertussis. Infants are most at risk for severe, life-threatening complications from pertussis. Another vaccine, called Td, protects against tetanus and diphtheria, but not pertussis. A Td booster should be given every 10 years. Tdap may be given as one of these boosters if you have never gotten Tdap before. Tdap may also be given after a severe cut or burn to prevent tetanus infection. Your doctor or the person giving you the vaccine can give you more information. Tdap may safely be given at the same time as other vaccines. 3. Some people should not get this vaccine  A person who has ever had a life-threatening allergic reaction after a previous dose of any diphtheria, tetanus or pertussis containing vaccine, OR has a severe allergy to any part of this vaccine, should not get Tdap vaccine. Tell the person giving the vaccine about any severe allergies.  Anyone who had coma or long repeated seizures within 7 days after a childhood dose of DTP or DTaP, or a previous dose of Tdap, should not get Tdap, unless a cause other than the vaccine was found. They can still get Td.  Talk to your doctor if you:  have seizures or another nervous system problem,  had severe pain or swelling after any vaccine containing diphtheria, tetanus or pertussis,  ever had a condition called Guillain-Barr Syndrome (GBS),  aren't feeling well on the day the shot is scheduled. 4. Risks With any medicine, including vaccines, there is   a chance of side effects. These are usually mild and go away on their own. Serious reactions are also possible but are rare. Most people who get Tdap vaccine do not have any problems with it. Mild problems following Tdap (Did not interfere with activities)  Pain where the shot was given (about 3 in 4  adolescents or 2 in 3 adults)  Redness or swelling where the shot was given (about 1 person in 5)  Mild fever of at least 100.4F (up to about 1 in 25 adolescents or 1 in 100 adults)  Headache (about 3 or 4 people in 10)  Tiredness (about 1 person in 3 or 4)  Nausea, vomiting, diarrhea, stomach ache (up to 1 in 4 adolescents or 1 in 10 adults)  Chills, sore joints (about 1 person in 10)  Body aches (about 1 person in 3 or 4)  Rash, swollen glands (uncommon) Moderate problems following Tdap (Interfered with activities, but did not require medical attention)  Pain where the shot was given (up to 1 in 5 or 6)  Redness or swelling where the shot was given (up to about 1 in 16 adolescents or 1 in 12 adults)  Fever over 102F (about 1 in 100 adolescents or 1 in 250 adults)  Headache (about 1 in 7 adolescents or 1 in 10 adults)  Nausea, vomiting, diarrhea, stomach ache (up to 1 or 3 people in 100)  Swelling of the entire arm where the shot was given (up to about 1 in 500). Severe problems following Tdap (Unable to perform usual activities; required medical attention)  Swelling, severe pain, bleeding and redness in the arm where the shot was given (rare). Problems that could happen after any vaccine:  People sometimes faint after a medical procedure, including vaccination. Sitting or lying down for about 15 minutes can help prevent fainting, and injuries caused by a fall. Tell your doctor if you feel dizzy, or have vision changes or ringing in the ears.  Some people get severe pain in the shoulder and have difficulty moving the arm where a shot was given. This happens very rarely.  Any medication can cause a severe allergic reaction. Such reactions from a vaccine are very rare, estimated at fewer than 1 in a million doses, and would happen within a few minutes to a few hours after the vaccination. As with any medicine, there is a very remote chance of a vaccine causing a serious  injury or death. The safety of vaccines is always being monitored. For more information, visit: www.cdc.gov/vaccinesafety/ 5. What if there is a serious problem? What should I look for?  Look for anything that concerns you, such as signs of a severe allergic reaction, very high fever, or unusual behavior.  Signs of a severe allergic reaction can include hives, swelling of the face and throat, difficulty breathing, a fast heartbeat, dizziness, and weakness. These would usually start a few minutes to a few hours after the vaccination. What should I do?  If you think it is a severe allergic reaction or other emergency that can't wait, call 9-1-1 or get the person to the nearest hospital. Otherwise, call your doctor.  Afterward, the reaction should be reported to the Vaccine Adverse Event Reporting System (VAERS). Your doctor might file this report, or you can do it yourself through the VAERS web site at www.vaers.hhs.gov, or by calling 1-800-822-7967. VAERS does not give medical advice.  6. The National Vaccine Injury Compensation Program The National Vaccine Injury Compensation Program (  VICP) is a federal program that was created to compensate people who may have been injured by certain vaccines. Persons who believe they may have been injured by a vaccine can learn about the program and about filing a claim by calling 289-746-6106 or visiting the [place] website at GoldCloset.com.ee. There is a time limit to file a claim for compensation. 7. How can I learn more?  Ask your doctor. He or she can give you the vaccine package insert or suggest other sources of information.  Call your local or state health department.  Contact the Centers for Disease Control and Prevention (CDC):  Call 763-165-8421 (1-800-CDC-INFO) or  Visit CDC's website at http://hunter.com/ CDC Tdap Vaccine VIS (02/07/14)   This information is not intended to replace advice given to you by your health care  provider. Make sure you discuss any questions you have with your health care provider.    Please do call to schedule your mammogram and bone density; the number to schedule one at either [facility] or [facility] is [phone]

## 2016-10-10 NOTE — Assessment & Plan Note (Signed)
Per Dr. [name]

## 2016-10-10 NOTE — Progress Notes (Signed)
BP (!) 165/87 (BP Location: Left Arm, Patient Position: Sitting, Cuff Size: Large)   Pulse 98   Temp 97.7 F (36.5 C)   Ht 5\' 5"  (1.651 m)   Wt 162 lb 3.2 oz (73.6 kg)   LMP  (LMP Unknown)   SpO2 98%   BMI 26.99 kg/m    Subjective:    Patient ID: Tonya [name], female    DOB: 11-22-1946, 70 y.o.   MRN: [id number]  HPI: Tonya [name] is a 70 y.o. female  Chief Complaint  Patient presents with  . Medicare Wellness   Functional Status Survey: Is the patient deaf or have difficulty hearing?: No Does the patient have difficulty seeing, even when wearing glasses/contacts?: Yes Does the patient have difficulty concentrating, remembering, or making decisions?: No Does the patient have difficulty walking or climbing stairs?: Yes Does the patient have difficulty dressing or bathing?: No Does the patient have difficulty doing errands alone such as visiting a doctor's office or shopping?: No  Depression screen [facility] 2/9 10/10/2016 09/10/2016  Decreased Interest 0 0  Down, Depressed, Hopeless 0 0  PHQ - 2 Score 0 0  Altered sleeping - 0  Tired, decreased energy - 1  Change in appetite - 0  Feeling bad or failure about yourself  - 1  Trouble concentrating - 0  Moving slowly or fidgety/restless - 0  Suicidal thoughts - 0  PHQ-9 Score - 2   Fall Risk  10/10/2016 09/10/2016  Falls in the past year? No No   Diabetes  Seeing Endocrine  Hypertension Not to goal.  Due to see Dr. Abigail Butts.  C/O ankle swelling since stopping HCTZ  Post herpetic neuralgia The itching is bothersome.  Comes and goes.  Lidoderm patches or cream did not help.  Neurontin caused inability to speak     Relevant past medical, surgical, family and social history reviewed and updated as indicated. Interim medical history since our last visit reviewed. Allergies and medications reviewed and updated.  Review of Systems  Per HPI unless specifically indicated above     Objective:    BP (!) 165/87 (BP  Location: Left Arm, Patient Position: Sitting, Cuff Size: Large)   Pulse 98   Temp 97.7 F (36.5 C)   Ht 5\' 5"  (1.651 m)   Wt 162 lb 3.2 oz (73.6 kg)   LMP  (LMP Unknown)   SpO2 98%   BMI 26.99 kg/m   Wt Readings from Last 3 Encounters:  10/10/16 162 lb 3.2 oz (73.6 kg)  09/10/16 159 lb 6.4 oz (72.3 kg)  11/21/15 151 lb 6.4 oz (68.7 kg)    Physical Exam  Constitutional: She is oriented to person, place, and time. She appears well-developed and well-nourished.  HENT:  Head: Normocephalic and atraumatic.  Eyes: Pupils are equal, round, and reactive to light. Right eye exhibits no discharge. Left eye exhibits no discharge. No scleral icterus.  Neck: Normal range of motion. Neck supple. Carotid bruit is not present. No thyromegaly present.  Cardiovascular: Normal rate, regular rhythm and normal heart sounds.  Exam reveals no gallop and no friction rub.   No murmur heard. Pulmonary/Chest: Effort normal and breath sounds normal. No respiratory distress. She has no wheezes. She has no rales.  Abdominal: Soft. Bowel sounds are normal. There is no tenderness. There is no rebound.  Genitourinary: No breast swelling, tenderness or discharge.  Musculoskeletal: Normal range of motion.  Lymphadenopathy:    She has no cervical adenopathy.  Neurological: She  is alert and oriented to person, place, and time.  Skin: Skin is warm, dry and intact. No rash noted.  Psychiatric: She has a normal mood and affect. Her speech is normal and behavior is normal. Judgment and thought content normal. Cognition and memory are normal.    Results for orders placed or performed in visit on 09/10/16  Hemoglobin A1c  Result Value Ref Range   Hemoglobin A1C 13.3%       Assessment & Plan:   Problem List Items Addressed This Visit      Unprioritized   Chronic kidney disease, stage 4, severely decreased GFR (HCC)    Appt with Dr. Abigail Butts.  Pt wants to start HCTZ and I willl defer that decision to him       Hypertensive CKD (chronic kidney disease)    Increase the Diovan.  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 [name]      Relevant Medications   valsartan (DIOVAN) 320 MG tablet   Post herpetic neuralgia    Lidoderm did not work.  Gabapentin caused a reaction.  Discussed with compounding pharmacist at [facility] and prescribed a topical cream of Gabapentin, Ketamine, clonidine, and Amitriptyline       Other Visit Diagnoses    Need for diphtheria-tetanus-pertussis (Tdap) vaccine, adult/adolescent    -  Primary   Relevant Orders   Tdap vaccine greater than or equal to 7yo IM (Completed)   Ovarian failure       Relevant Orders   DG Bone Density   Breast cancer screening       Relevant Orders   MM DIGITAL SCREENING BILATERAL       Follow up plan: Return in about 3 months (around 01/10/2017).

## 2016-10-10 NOTE — Assessment & Plan Note (Addendum)
Lidoderm did not work.  Gabapentin caused a reaction.  Discussed with compounding pharmacist at [facility] and prescribed a topical cream of Gabapentin, Ketamine, clonidine, and Amitriptyline

## 2016-10-13 DIAGNOSIS — R809 Proteinuria, unspecified: Secondary | ICD-10-CM | POA: Diagnosis not present

## 2016-10-13 DIAGNOSIS — I10 Essential (primary) hypertension: Secondary | ICD-10-CM | POA: Diagnosis not present

## 2016-10-13 DIAGNOSIS — N183 Chronic kidney disease, stage 3 (moderate): Secondary | ICD-10-CM | POA: Diagnosis not present

## 2016-10-13 DIAGNOSIS — E782 Mixed hyperlipidemia: Secondary | ICD-10-CM | POA: Diagnosis not present

## 2016-10-13 DIAGNOSIS — Z794 Long term (current) use of insulin: Secondary | ICD-10-CM | POA: Diagnosis not present

## 2016-10-13 DIAGNOSIS — E1165 Type 2 diabetes mellitus with hyperglycemia: Secondary | ICD-10-CM | POA: Diagnosis not present

## 2016-10-13 DIAGNOSIS — E1129 Type 2 diabetes mellitus with other diabetic kidney complication: Secondary | ICD-10-CM | POA: Diagnosis not present

## 2016-10-13 DIAGNOSIS — E1122 Type 2 diabetes mellitus with diabetic chronic kidney disease: Secondary | ICD-10-CM | POA: Diagnosis not present

## 2016-10-20 DIAGNOSIS — N2581 Secondary hyperparathyroidism of renal origin: Secondary | ICD-10-CM | POA: Diagnosis not present

## 2016-10-20 DIAGNOSIS — I129 Hypertensive chronic kidney disease with stage 1 through stage 4 chronic kidney disease, or unspecified chronic kidney disease: Secondary | ICD-10-CM | POA: Diagnosis not present

## 2016-10-20 DIAGNOSIS — N183 Chronic kidney disease, stage 3 (moderate): Secondary | ICD-10-CM | POA: Diagnosis not present

## 2016-10-20 DIAGNOSIS — E1122 Type 2 diabetes mellitus with diabetic chronic kidney disease: Secondary | ICD-10-CM | POA: Diagnosis not present

## 2016-10-20 DIAGNOSIS — R809 Proteinuria, unspecified: Secondary | ICD-10-CM | POA: Diagnosis not present

## 2016-10-28 DIAGNOSIS — E113211 Type 2 diabetes mellitus with mild nonproliferative diabetic retinopathy with macular edema, right eye: Secondary | ICD-10-CM | POA: Diagnosis not present

## 2016-10-31 ENCOUNTER — Telehealth: Payer: Self-pay | Admitting: Unknown Physician Specialty

## 2016-10-31 NOTE — Telephone Encounter (Signed)
RX faxed to pharmacy.

## 2016-10-31 NOTE — Telephone Encounter (Signed)
Pt called stated she needs a refill on her needles for her Humalog as she is using about 3 per day and the RX states one per day. Please correct RX and send to [facility] in [place]. Thanks.

## 2016-10-31 NOTE — Telephone Encounter (Signed)
RX form filled out. Will get Tonya [name] to sign and fax to pharmacy.

## 2016-12-12 DIAGNOSIS — I10 Essential (primary) hypertension: Secondary | ICD-10-CM | POA: Diagnosis not present

## 2016-12-12 DIAGNOSIS — Z794 Long term (current) use of insulin: Secondary | ICD-10-CM | POA: Diagnosis not present

## 2016-12-12 DIAGNOSIS — E1165 Type 2 diabetes mellitus with hyperglycemia: Secondary | ICD-10-CM | POA: Diagnosis not present

## 2016-12-12 DIAGNOSIS — E782 Mixed hyperlipidemia: Secondary | ICD-10-CM | POA: Diagnosis not present

## 2017-01-06 DIAGNOSIS — E113293 Type 2 diabetes mellitus with mild nonproliferative diabetic retinopathy without macular edema, bilateral: Secondary | ICD-10-CM | POA: Diagnosis not present

## 2017-01-06 LAB — HM DIABETES EYE EXAM

## 2017-01-09 ENCOUNTER — Encounter: Payer: Self-pay | Admitting: Unknown Physician Specialty

## 2017-01-09 ENCOUNTER — Ambulatory Visit (INDEPENDENT_AMBULATORY_CARE_PROVIDER_SITE_OTHER): Payer: Medicare Other | Admitting: Unknown Physician Specialty

## 2017-01-09 VITALS — BP 132/84 | HR 79 | Temp 97.9°F | Ht 66.0 in | Wt 161.6 lb

## 2017-01-09 DIAGNOSIS — E782 Mixed hyperlipidemia: Secondary | ICD-10-CM

## 2017-01-09 DIAGNOSIS — Z0000 Encounter for general adult medical examination without abnormal findings: Secondary | ICD-10-CM

## 2017-01-09 DIAGNOSIS — E1140 Type 2 diabetes mellitus with diabetic neuropathy, unspecified: Secondary | ICD-10-CM

## 2017-01-09 DIAGNOSIS — B0229 Other postherpetic nervous system involvement: Secondary | ICD-10-CM

## 2017-01-09 DIAGNOSIS — E1165 Type 2 diabetes mellitus with hyperglycemia: Secondary | ICD-10-CM | POA: Diagnosis not present

## 2017-01-09 DIAGNOSIS — Z1239 Encounter for other screening for malignant neoplasm of breast: Secondary | ICD-10-CM

## 2017-01-09 DIAGNOSIS — E2839 Other primary ovarian failure: Secondary | ICD-10-CM | POA: Diagnosis not present

## 2017-01-09 DIAGNOSIS — I129 Hypertensive chronic kidney disease with stage 1 through stage 4 chronic kidney disease, or unspecified chronic kidney disease: Secondary | ICD-10-CM

## 2017-01-09 DIAGNOSIS — Z1231 Encounter for screening mammogram for malignant neoplasm of breast: Secondary | ICD-10-CM | POA: Diagnosis not present

## 2017-01-09 MED ORDER — AMITRIPTYLINE HCL 25 MG PO TABS: 25.0000 mg | ORAL_TABLET | Freq: Every day | ORAL | 2 refills | Status: DC

## 2017-01-09 NOTE — Assessment & Plan Note (Signed)
F/U BP was better.  F/U with Dr. Abigail Butts

## 2017-01-09 NOTE — Patient Instructions (Signed)
Please do call to schedule your mammogram and bone density; the number to schedule one at either [facility] or [facility] is [phone]   

## 2017-01-09 NOTE — Assessment & Plan Note (Signed)
Intolerant to Gabapentin.  Try low dose Amitrytiline.

## 2017-01-09 NOTE — Assessment & Plan Note (Signed)
Per endocrine.  Has some trouble with affordibility

## 2017-01-09 NOTE — Progress Notes (Signed)
BP 132/84   Pulse 79   Temp 97.9 F (36.6 C)   Ht 5\' 6"  (1.676 m) Comment: pt had shoes on  Wt 161 lb 9.6 oz (73.3 kg) Comment: pt had shoes on  LMP  (LMP Unknown)   SpO2 98%   BMI 26.08 kg/m    Subjective:    Patient ID: Tonya [name], female    DOB: Jan 21, 1946, 71 y.o.   MRN: [id number]  HPI: Tonya [name] is a 71 y.o. female  Chief Complaint  Patient presents with  . Diabetes  . Hyperlipidemia  . Hypertension  . Leg Pain    pt states she has been having pain in right leg and back from shingles that she had 5 years ago    Diabetes: Through Endocrine at [facility].  Taking 16-20 units of Levimir and 8-10 Humalog with meals  Hypertension  States she is taking both Valsartan and Metoprolol daily Average home BPs Not checking  Using medication without problems or lightheadedness No chest pain with exertion or shortness of breath No swelling since starting on Lasix   Elevated Cholesterol Often does not take No Muscle aches  Diet/Exercise: None  Right leg pain For 2 weeks getting worse.  This is due to post herpetic neuralgia.  She does not tolerate Gabapentin.    Relevant past medical, surgical, family and social history reviewed and updated as indicated. Interim medical history since our last visit reviewed. Allergies and medications reviewed and updated.  Review of Systems  Per HPI unless specifically indicated above     Objective:    BP 132/84   Pulse 79   Temp 97.9 F (36.6 C)   Ht 5\' 6"  (1.676 m) Comment: pt had shoes on  Wt 161 lb 9.6 oz (73.3 kg) Comment: pt had shoes on  LMP  (LMP Unknown)   SpO2 98%   BMI 26.08 kg/m   Wt Readings from Last 3 Encounters:  01/09/17 161 lb 9.6 oz (73.3 kg)  10/10/16 162 lb 3.2 oz (73.6 kg)  09/10/16 159 lb 6.4 oz (72.3 kg)    Physical Exam  Constitutional: She is oriented to person, place, and time. She appears well-developed and well-nourished. No distress.  HENT:  Head: Normocephalic and atraumatic.    Eyes: Conjunctivae and lids are normal. Right eye exhibits no discharge. Left eye exhibits no discharge. No scleral icterus.  Neck: Normal range of motion. Neck supple. No JVD present. Carotid bruit is not present.  Cardiovascular: Normal rate, regular rhythm and normal heart sounds.   Pulmonary/Chest: Effort normal and breath sounds normal.  Abdominal: Normal appearance. There is no splenomegaly or hepatomegaly.  Musculoskeletal: Normal range of motion.  Neurological: She is alert and oriented to person, place, and time.  Skin: Skin is warm, dry and intact. No rash noted. No pallor.  Psychiatric: She has a normal mood and affect. Her behavior is normal. Judgment and thought content normal.    Results for orders placed or performed in visit on 09/10/16  Hemoglobin A1c  Result Value Ref Range   Hemoglobin A1C 13.3%       Assessment & Plan:   Problem List Items Addressed This Visit      Unprioritized   Hyperlipidemia    Encouraged compliance with statin      Hypertensive CKD (chronic kidney disease)    F/U BP was better.  F/U with Dr. Abigail Butts      Poorly controlled type 2 diabetes mellitus with neuropathy ([place])  Per endocrine.  Has some trouble with affordibility      Relevant Medications   Insulin Detemir (LEVEMIR FLEXPEN) 100 UNIT/ML Pen   insulin lispro (HUMALOG) 100 UNIT/ML KiwkPen   Post herpetic neuralgia    Intolerant to Gabapentin.  Try low dose Amitrytiline.        Relevant Medications   amitriptyline (ELAVIL) 25 MG tablet    Other Visit Diagnoses    Routine general medical examination at a health care facility    -  Primary   Relevant Orders   MM DIGITAL SCREENING BILATERAL   Ambulatory referral to Gastroenterology   Ovarian failure       Relevant Orders   DG Bone Density   Breast cancer screening       Relevant Orders   MM DIGITAL SCREENING BILATERAL       Follow up plan: Return in about 6 months (around 07/09/2017).

## 2017-01-09 NOTE — Assessment & Plan Note (Signed)
Encouraged compliance with statin. 

## 2017-01-14 DIAGNOSIS — E59 Dietary selenium deficiency: Secondary | ICD-10-CM | POA: Diagnosis not present

## 2017-01-14 DIAGNOSIS — E559 Vitamin D deficiency, unspecified: Secondary | ICD-10-CM | POA: Diagnosis not present

## 2017-01-14 DIAGNOSIS — E1122 Type 2 diabetes mellitus with diabetic chronic kidney disease: Secondary | ICD-10-CM | POA: Diagnosis not present

## 2017-01-14 DIAGNOSIS — I129 Hypertensive chronic kidney disease with stage 1 through stage 4 chronic kidney disease, or unspecified chronic kidney disease: Secondary | ICD-10-CM | POA: Diagnosis not present

## 2017-01-14 DIAGNOSIS — N2581 Secondary hyperparathyroidism of renal origin: Secondary | ICD-10-CM | POA: Diagnosis not present

## 2017-01-14 DIAGNOSIS — R809 Proteinuria, unspecified: Secondary | ICD-10-CM | POA: Diagnosis not present

## 2017-01-14 DIAGNOSIS — N183 Chronic kidney disease, stage 3 (moderate): Secondary | ICD-10-CM | POA: Diagnosis not present

## 2017-03-11 ENCOUNTER — Encounter: Payer: Self-pay | Admitting: Unknown Physician Specialty

## 2017-03-11 ENCOUNTER — Ambulatory Visit
Admission: RE | Admit: 2017-03-11 | Discharge: 2017-03-11 | Disposition: A | Discharge status: Home / Self Care | Payer: Medicare Other | Source: Ambulatory Visit | Attending: Unknown Physician Specialty | Admitting: Unknown Physician Specialty

## 2017-03-11 DIAGNOSIS — Z1239 Encounter for other screening for malignant neoplasm of breast: Secondary | ICD-10-CM

## 2017-03-11 DIAGNOSIS — E2839 Other primary ovarian failure: Secondary | ICD-10-CM

## 2017-03-11 DIAGNOSIS — Z780 Asymptomatic menopausal state: Secondary | ICD-10-CM | POA: Diagnosis not present

## 2017-03-11 DIAGNOSIS — Z0000 Encounter for general adult medical examination without abnormal findings: Secondary | ICD-10-CM

## 2017-03-11 DIAGNOSIS — Z1231 Encounter for screening mammogram for malignant neoplasm of breast: Secondary | ICD-10-CM | POA: Diagnosis not present

## 2017-03-19 DIAGNOSIS — Z794 Long term (current) use of insulin: Secondary | ICD-10-CM | POA: Diagnosis not present

## 2017-03-19 DIAGNOSIS — E782 Mixed hyperlipidemia: Secondary | ICD-10-CM | POA: Diagnosis not present

## 2017-03-19 DIAGNOSIS — I10 Essential (primary) hypertension: Secondary | ICD-10-CM | POA: Diagnosis not present

## 2017-03-19 DIAGNOSIS — E1165 Type 2 diabetes mellitus with hyperglycemia: Secondary | ICD-10-CM | POA: Diagnosis not present

## 2017-03-20 DIAGNOSIS — Z794 Long term (current) use of insulin: Secondary | ICD-10-CM | POA: Diagnosis not present

## 2017-03-20 DIAGNOSIS — N183 Chronic kidney disease, stage 3 (moderate): Secondary | ICD-10-CM | POA: Diagnosis not present

## 2017-03-20 DIAGNOSIS — E1165 Type 2 diabetes mellitus with hyperglycemia: Secondary | ICD-10-CM | POA: Diagnosis not present

## 2017-03-20 DIAGNOSIS — Z9114 Patient's other noncompliance with medication regimen: Secondary | ICD-10-CM | POA: Diagnosis not present

## 2017-03-20 DIAGNOSIS — I10 Essential (primary) hypertension: Secondary | ICD-10-CM | POA: Diagnosis not present

## 2017-03-20 DIAGNOSIS — E1122 Type 2 diabetes mellitus with diabetic chronic kidney disease: Secondary | ICD-10-CM | POA: Diagnosis not present

## 2017-03-25 ENCOUNTER — Ambulatory Visit (INDEPENDENT_AMBULATORY_CARE_PROVIDER_SITE_OTHER): Payer: Medicare Other | Admitting: Unknown Physician Specialty

## 2017-03-25 ENCOUNTER — Encounter: Payer: Self-pay | Admitting: Unknown Physician Specialty

## 2017-03-25 ENCOUNTER — Ambulatory Visit
Admission: RE | Admit: 2017-03-25 | Discharge: 2017-03-25 | Disposition: A | Discharge status: Home / Self Care | Payer: Medicare Other | Source: Ambulatory Visit | Attending: Unknown Physician Specialty | Admitting: Unknown Physician Specialty

## 2017-03-25 VITALS — BP 175/85 | HR 65 | Temp 97.5°F | Wt 168.2 lb

## 2017-03-25 DIAGNOSIS — R0602 Shortness of breath: Secondary | ICD-10-CM | POA: Diagnosis not present

## 2017-03-25 DIAGNOSIS — M25473 Effusion, unspecified ankle: Secondary | ICD-10-CM

## 2017-03-25 DIAGNOSIS — I517 Cardiomegaly: Secondary | ICD-10-CM | POA: Insufficient documentation

## 2017-03-25 DIAGNOSIS — I129 Hypertensive chronic kidney disease with stage 1 through stage 4 chronic kidney disease, or unspecified chronic kidney disease: Secondary | ICD-10-CM

## 2017-03-25 DIAGNOSIS — R0989 Other specified symptoms and signs involving the circulatory and respiratory systems: Secondary | ICD-10-CM | POA: Diagnosis not present

## 2017-03-25 MED ORDER — FUROSEMIDE 40 MG PO TABS: 40.0000 mg | ORAL_TABLET | Freq: Every day | ORAL | 0 refills | Status: DC

## 2017-03-25 NOTE — Assessment & Plan Note (Signed)
High and uncontrolled today.  Planning on going home to take BP meds as skipped this AM

## 2017-03-25 NOTE — Progress Notes (Signed)
BP (!) 175/85 (BP Location: Left Arm, Cuff Size: Normal)   Pulse 65   Temp 97.5 F (36.4 C)   Wt 168 lb 3.2 oz (76.3 kg)   LMP  (LMP Unknown)   SpO2 99%   BMI 27.15 kg/m    Subjective:    Patient ID: Tonya [name], female    DOB: 08-31-1946, 72 y.o.   MRN: [id number]  HPI: Tonya [name] is a 71 y.o. female  Chief Complaint  Patient presents with  . Joint Swelling    pt states that both of her ankles have been swelling since [name]   Ankle swelling Pt states she is having trouble with ankle swelling for the last 3 days.  This is ongoing since I took her off the HCTZ at her last visit. Reviewed last Nephrology note (10/2017) also discouraging use of HCTZ.  Pt states the swelling gets worse throughout the day but a little better in the AM.  States it still "hurts."  She does have some SOB with activity.   Due to see Nephrology soon. States she takes her BP meds every day but did not bring them in.   She does have a strong family history of kidney failure.  Last GFR in my office was 42.    Going to Endocrine for poorly controlled DM.  That note was reviewed and last Hgb A1C was 11.8.  Compliance seems to be an issue.    BP not under good control but did not take BP meds this AM and normally takes it by this time.  Does not check at home.    Family History  Problem Relation Age of Onset  . Diabetes Mother   . Stroke Mother   . Hyperlipidemia Father   . Hypertension Father   . Diabetes Sister   . Diabetes Brother   . Gout Son   . Diabetes Brother   . Kidney disease Son   . Breast cancer Neg Hx    Past Medical History:  Diagnosis Date  . Chronic kidney disease   . Diabetes mellitus without complication ([place])   . Hyperlipidemia   . Hypertension      Relevant past medical, surgical, family and social history reviewed and updated as indicated. Interim medical history since our last visit reviewed. Allergies and medications reviewed and updated.  Review of  Systems  Per HPI unless specifically indicated above     Objective:    BP (!) 175/85 (BP Location: Left Arm, Cuff Size: Normal)   Pulse 65   Temp 97.5 F (36.4 C)   Wt 168 lb 3.2 oz (76.3 kg)   LMP  (LMP Unknown)   SpO2 99%   BMI 27.15 kg/m   Wt Readings from Last 3 Encounters:  03/25/17 168 lb 3.2 oz (76.3 kg)  01/09/17 161 lb 9.6 oz (73.3 kg)  10/10/16 162 lb 3.2 oz (73.6 kg)    Physical Exam  Constitutional: She is oriented to person, place, and time. She appears well-developed and well-nourished. No distress.  HENT:  Head: Normocephalic and atraumatic.  Eyes: Conjunctivae and lids are normal. Right eye exhibits no discharge. Left eye exhibits no discharge. No scleral icterus.  Neck: Normal range of motion. Neck supple. No JVD present. Carotid bruit is not present.  Cardiovascular: Normal rate, regular rhythm and normal heart sounds.   Pulmonary/Chest: Effort normal and breath sounds normal.  Abdominal: Normal appearance. There is no splenomegaly or hepatomegaly.  Musculoskeletal: Normal range of motion.  Neurological: She is alert and oriented to person, place, and time.  Skin: Skin is warm, dry and intact. No rash noted. No pallor.  Psychiatric: She has a normal mood and affect. Her behavior is normal. Judgment and thought content normal.    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  Planning on going home to take BP meds as skipped this AM       Other Visit Diagnoses    Ankle edema    -  Primary   Labs done by Dr. [name] reviewed.  Electrolytes are normal with GFR 42.  Increase Lasix to 40 mg until f/u with Nephrology.  Chest x-ray for cardiac evaluatio   Relevant Orders   DG Chest  2 View   SOB (shortness of breath)       Evaluate for CHF on chest x-ray   Relevant Orders   DG Chest 2 View      No labs today as just done with Dr [name] and will be done next week at Nephrology visit.  Recommended compression stockings.    Greater than 50% of this  25 minute visit was spent in counseling/coordination of care regarding evaluation of multiple specialist notes.     Follow up plan: Return for with nephrology and chest x-ray results.   Addendum: Chest x-ray showing mild CHF.  Called pt and discussed increasing Furosemide is apppropriate

## 2017-04-01 DIAGNOSIS — R809 Proteinuria, unspecified: Secondary | ICD-10-CM | POA: Diagnosis not present

## 2017-04-01 DIAGNOSIS — N183 Chronic kidney disease, stage 3 (moderate): Secondary | ICD-10-CM | POA: Diagnosis not present

## 2017-04-01 DIAGNOSIS — E1122 Type 2 diabetes mellitus with diabetic chronic kidney disease: Secondary | ICD-10-CM | POA: Diagnosis not present

## 2017-04-01 DIAGNOSIS — R609 Edema, unspecified: Secondary | ICD-10-CM | POA: Diagnosis not present

## 2017-04-01 DIAGNOSIS — I129 Hypertensive chronic kidney disease with stage 1 through stage 4 chronic kidney disease, or unspecified chronic kidney disease: Secondary | ICD-10-CM | POA: Diagnosis not present

## 2017-04-07 DIAGNOSIS — E113393 Type 2 diabetes mellitus with moderate nonproliferative diabetic retinopathy without macular edema, bilateral: Secondary | ICD-10-CM | POA: Diagnosis not present

## 2017-04-13 ENCOUNTER — Ambulatory Visit (INDEPENDENT_AMBULATORY_CARE_PROVIDER_SITE_OTHER): Payer: Medicare Other | Admitting: Unknown Physician Specialty

## 2017-04-13 ENCOUNTER — Encounter: Payer: Self-pay | Admitting: Unknown Physician Specialty

## 2017-04-13 VITALS — BP 205/108 | HR 81 | Temp 98.1°F | Wt 163.4 lb

## 2017-04-13 DIAGNOSIS — R600 Localized edema: Secondary | ICD-10-CM | POA: Diagnosis not present

## 2017-04-13 DIAGNOSIS — H35033 Hypertensive retinopathy, bilateral: Secondary | ICD-10-CM | POA: Diagnosis not present

## 2017-04-13 DIAGNOSIS — I129 Hypertensive chronic kidney disease with stage 1 through stage 4 chronic kidney disease, or unspecified chronic kidney disease: Secondary | ICD-10-CM | POA: Diagnosis not present

## 2017-04-13 DIAGNOSIS — Z7189 Other specified counseling: Secondary | ICD-10-CM

## 2017-04-13 DIAGNOSIS — N183 Chronic kidney disease, stage 3 (moderate): Secondary | ICD-10-CM | POA: Diagnosis not present

## 2017-04-13 DIAGNOSIS — I509 Heart failure, unspecified: Secondary | ICD-10-CM

## 2017-04-13 DIAGNOSIS — I517 Cardiomegaly: Secondary | ICD-10-CM | POA: Diagnosis not present

## 2017-04-13 MED ORDER — AMLODIPINE BESYLATE 5 MG PO TABS: 5.0000 mg | ORAL_TABLET | Freq: Every day | ORAL | 3 refills | Status: DC

## 2017-04-13 NOTE — Assessment & Plan Note (Signed)
A voluntary discussion about advance care planning including the explanation and discussion of advance directives was extensively discussed  with the patient.  Explanation about the health care proxy and Living will was reviewed and packet with forms with explanation of how to fill them out was given.  During this discussion, the patient was able to identify a health care proxy as her daughter Glayds [place]  and plans/does not plan to fill out the paperwork required.  Patient was offered a separate [place] visit for further assistance with forms.

## 2017-04-13 NOTE — Progress Notes (Signed)
BP (!) 205/108 (BP Location: Left Arm, Cuff Size: Normal)   Pulse 81   Temp 98.1 F (36.7 C)   Wt 163 lb 6.4 oz (74.1 kg)   LMP  (LMP Unknown)   SpO2 97%   BMI 26.37 kg/m    Subjective:    Patient ID: Tonya [name], female    DOB: [birth year], 71 y.o.   MRN: [id number]  HPI: Tonya [name] is a 71 y.o. female  Chief Complaint  Patient presents with  . Hypertension   Pt is here with her daughter who gives part of the history.  She was sent to [place] from opthamology with a BP of 200/110.  I rechecked it at 180/90.    Her daughter is particularly concerned with her swelling that has been going on for months.  This is worse in the PM and pretty much resolved in the AM when she gets up.    States she took her medicine this AM.  Named all her BP meds that she took Not checking her BP at home but on chart review, 140s at Nephrology Lightheaded when she looks at the sunlight. No chest pain with exertion or shortness of breath   Reviewed last chest x-ray.  Cardiomegally with mild CHF.  Started Furosemide last visit at 20 mg and inreased to 40 mg with Dr. Abigail Butts.     Relevant past medical, surgical, family and social history reviewed and updated as indicated. Interim medical history since our last visit reviewed. Allergies and medications reviewed and updated.  Review of Systems  Per HPI unless specifically indicated above     Objective:    BP (!) 205/108 (BP Location: Left Arm, Cuff Size: Normal)   Pulse 81   Temp 98.1 F (36.7 C)   Wt 163 lb 6.4 oz (74.1 kg)   LMP  (LMP Unknown)   SpO2 97%   BMI 26.37 kg/m   Wt Readings from Last 3 Encounters:  04/13/17 163 lb 6.4 oz (74.1 kg)  03/25/17 168 lb 3.2 oz (76.3 kg)  01/09/17 161 lb 9.6 oz (73.3 kg)    Physical Exam  Constitutional: She is oriented to person, place, and time. She appears well-developed and well-nourished. No distress.  HENT:  Head: Normocephalic and atraumatic.  Eyes: Conjunctivae and lids are normal.  Right eye exhibits no discharge. Left eye exhibits no discharge. No scleral icterus.  Neck: Normal range of motion. Neck supple. No JVD present. Carotid bruit is not present.  Cardiovascular: Normal rate, regular rhythm and normal heart sounds.   Pulmonary/Chest: Effort normal and breath sounds normal.  Abdominal: Normal appearance. There is no splenomegaly or hepatomegaly.  Musculoskeletal: Normal range of motion.  Neurological: She is alert and oriented to person, place, and time.  Skin: Skin is warm, dry and intact. No rash noted. No pallor.  Psychiatric: She has a normal mood and affect. Her behavior is normal. Judgment and thought content normal.    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  Explanation about the health care proxy and Living  will was reviewed and packet with forms with explanation of how to fill them out was given.  During this discussion, the patient was able to identify a health care proxy as her daughter Tonya [name]  and plans/does not plan to fill out the paperwork required.  Patient was offered a separate [facility] visit for further assistance with forms.          Bilateral leg edema   Relevant Orders   Ambulatory referral to Vascular Surgery   Hypertensive CKD (chronic kidney disease)    Discussed pt with Dr. Wynetta Emery.  I will start Amlodipine.  I do know that she was stopped this previously due to leg swelling but has swelling despite this so will restart this       Other Visit Diagnoses    Cardiomegaly    -  Primary   Relevant  Medications   amLODipine (NORVASC) 5 MG tablet   Other Relevant Orders   Ambulatory referral to Cardiology   Chronic heart failure, unspecified heart failure type ([place])       Relevant Medications   amLODipine (NORVASC) 5 MG tablet   Other Relevant Orders   Ambulatory referral to Cardiology      Due to see Nephrology this week.   Refer to cardiology ASAP.    Follow up plan: Return in about 2 days (around 04/15/2017) for in the afternoon.

## 2017-04-13 NOTE — Assessment & Plan Note (Addendum)
Discussed pt with Dr. Wynetta Emery.  I will start Amlodipine.  I do know that she was stopped this previously due to leg swelling but has swelling despite this so will restart this

## 2017-04-14 DIAGNOSIS — E1122 Type 2 diabetes mellitus with diabetic chronic kidney disease: Secondary | ICD-10-CM | POA: Diagnosis not present

## 2017-04-14 DIAGNOSIS — N183 Chronic kidney disease, stage 3 (moderate): Secondary | ICD-10-CM | POA: Diagnosis not present

## 2017-04-14 DIAGNOSIS — D631 Anemia in chronic kidney disease: Secondary | ICD-10-CM | POA: Diagnosis not present

## 2017-04-14 DIAGNOSIS — R809 Proteinuria, unspecified: Secondary | ICD-10-CM | POA: Diagnosis not present

## 2017-04-14 DIAGNOSIS — N2581 Secondary hyperparathyroidism of renal origin: Secondary | ICD-10-CM | POA: Diagnosis not present

## 2017-04-14 DIAGNOSIS — I10 Essential (primary) hypertension: Secondary | ICD-10-CM | POA: Diagnosis not present

## 2017-04-15 ENCOUNTER — Encounter: Payer: Self-pay | Admitting: Unknown Physician Specialty

## 2017-04-15 ENCOUNTER — Ambulatory Visit (INDEPENDENT_AMBULATORY_CARE_PROVIDER_SITE_OTHER): Payer: Medicare Other | Admitting: Unknown Physician Specialty

## 2017-04-15 DIAGNOSIS — R600 Localized edema: Secondary | ICD-10-CM | POA: Diagnosis not present

## 2017-04-15 DIAGNOSIS — I129 Hypertensive chronic kidney disease with stage 1 through stage 4 chronic kidney disease, or unspecified chronic kidney disease: Secondary | ICD-10-CM | POA: Diagnosis not present

## 2017-04-15 DIAGNOSIS — N183 Chronic kidney disease, stage 3 (moderate): Secondary | ICD-10-CM | POA: Diagnosis not present

## 2017-04-15 NOTE — Progress Notes (Signed)
   BP (!) 152/70   Pulse 73   Temp 98.2 F (36.8 C)   Wt 161 lb 9.6 oz (73.3 kg)   LMP  (LMP Unknown)   SpO2 97%   BMI 26.08 kg/m    Subjective:    Patient ID: Tonya [name], female    DOB: [birth year], 71 y.o.   MRN: [id number]  HPI: Tonya [name] is a 71 y.o. female  Chief Complaint  Patient presents with  . Hypertension    2 day f/up   Hypertension Added Amlodipine last visit.  Using medications without difficulty Average home BP noted SBP of 123.  150 at the Nephrologist  No problems or lightheadedness No chest pain with exertion or shortness of breath Swelling is improved as she is wearing support hose.    Relevant past medical, surgical, family and social history reviewed and updated as indicated. Interim medical history since our last visit reviewed. Allergies and medications reviewed and updated.  Review of Systems  Per HPI unless specifically indicated above     Objective:    BP (!) 152/70   Pulse 73   Temp 98.2 F (36.8 C)   Wt 161 lb 9.6 oz (73.3 kg)   LMP  (LMP Unknown)   SpO2 97%   BMI 26.08 kg/m   Wt Readings from Last 3 Encounters:  04/15/17 161 lb 9.6 oz (73.3 kg)  04/13/17 163 lb 6.4 oz (74.1 kg)  03/25/17 168 lb 3.2 oz (76.3 kg)    Physical Exam  Constitutional: She is oriented to person, place, and time. She appears well-developed and well-nourished. No distress.  HENT:  Head: Normocephalic and atraumatic.  Eyes: Conjunctivae and lids are normal. Right eye exhibits no discharge. Left eye exhibits no discharge. No scleral icterus.  Neck: Normal range of motion. Neck supple. No JVD present. Carotid bruit is not present.  Cardiovascular: Normal rate, regular rhythm and normal heart sounds.   Pulmonary/Chest: Effort normal and breath sounds normal.  Abdominal: Normal appearance. There is no splenomegaly or hepatomegaly.  Musculoskeletal: Normal range of motion.  Neurological: She is alert and oriented to person, place, and time.  Skin:  Skin is warm, dry and intact. No rash noted. No pallor.  Psychiatric: She has a normal mood and affect. Her behavior is normal. Judgment and thought content normal.    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        Hypertensive CKD (chronic kidney disease)    BP improved.  152/72 on recheck.  Even better numbers at home and SBP in the 120;s         [facility] unable to get hold of patient.  Gave card to daughter.    Follow up plan: Return in about 3 months (around 07/16/2017).

## 2017-04-15 NOTE — Assessment & Plan Note (Signed)
Improved with support hose.

## 2017-04-15 NOTE — Assessment & Plan Note (Addendum)
BP improved.  152/72 on recheck.  Even better numbers at home and SBP in the 120;s

## 2017-04-21 ENCOUNTER — Other Ambulatory Visit: Payer: Self-pay | Admitting: Unknown Physician Specialty

## 2017-04-27 DIAGNOSIS — H35033 Hypertensive retinopathy, bilateral: Secondary | ICD-10-CM | POA: Diagnosis not present

## 2017-04-27 DIAGNOSIS — E113493 Type 2 diabetes mellitus with severe nonproliferative diabetic retinopathy without macular edema, bilateral: Secondary | ICD-10-CM | POA: Diagnosis not present

## 2017-05-02 NOTE — Progress Notes (Signed)
Cardiology Office Note  Date:  05/04/2017   ID:  Tonya [name], DOB 24-Nov-1946, MRN [id number]  PCP:  Kathrine Haddock, NP   Chief Complaint  Patient presents with  . other    Ref Dr. Kathrine Haddock for evaluation of CAD; pt. has a history of uncontrolled diabetes, HTN & hyperlipidemia. Pt. c/o shortness of breath with little exertion. Meds reviewed by the pt. verbally.     HPI:  71 yo woman with past medical hx of  DM, HBA1C 13 down to 11.8 Chronic Leg edema HTN Hyperlipidemia Chronic kidney disease, followed by nephrology chest x-ray suggesting  Cardiomegally with mild CHF, on lasix Presenting by referral from Kathrine Haddock for consultation of her cardiomyopathy, leg swelling  She denies any significant shortness of breath on exertion Reports having chronic leg swelling Was sent for chest x-ray by primary care, this showed cardiomegaly possible urinary edema Consistently taking  Her Lasix 40 mg daily Possible worsening of her leg swelling on amlodipine  denies any chest pain concerning for angina    she is trying to watch her diet Recent increase in metformin, diabetes managed by endocrinology  Previous total cholesterol 239, LDL 134 with hemoglobin A1c at that time of 13 Creatinine 1.47 with BUN 32  EKG personally reviewed by myself on todays visit Shows normal sinus rhythm with rate 73 bpm T wave abnormality V3 through V6,  1 and aVL, left axis deviation. No previous EKGs available for review    PMH:   has a past medical history of Chronic kidney disease; Diabetes mellitus without complication ([place]); Hyperlipidemia; and Hypertension.  PSH:    Past Surgical History:  Procedure Laterality Date  . COLONOSCOPY  12/15/2006  . TUBAL LIGATION      Current Outpatient Prescriptions  Medication Sig Dispense Refill  . amitriptyline (ELAVIL) 25 MG tablet Take 1 tablet (25 mg total) by mouth at bedtime. 30 tablet 2  . amLODipine (NORVASC) 5 MG tablet Take 1 tablet (5 mg total)  by mouth daily as needed. 30 tablet 3  . aspirin 81 MG tablet Take 81 mg by mouth daily.    . furosemide (LASIX) 40 MG tablet Take 1 tablet (40 mg total) by mouth daily. 30 tablet 0  . Insulin Detemir (LEVEMIR FLEXPEN) 100 UNIT/ML Pen Inject 15-20 Units into the skin Nightly.    . Insulin Pen Needle (NOVOFINE) 30G X 8 MM MISC Inject 10 each into the skin 3 (three) times daily. E11.40 100 each 12  . lidocaine (XYLOCAINE) 5 % ointment APPLY EXTERNALLY TO THE AFFECTED AREA AS NEEDED 30 g 0  . metFORMIN (GLUCOPHAGE-XR) 500 MG 24 hr tablet Take 1,000 mg by mouth daily.    . metoprolol succinate (TOPROL-XL) 50 MG 24 hr tablet TAKE 1 TABLET BY MOUTH EVERY DAY. 30 tablet 0  . omeprazole (PRILOSEC) 20 MG capsule Take 1 capsule (20 mg total) by mouth 2 (two) times daily. 60 capsule 5  . ONE TOUCH ULTRA TEST test strip USE TO TEST BLOOD SUGAR THREE TIMES DAILY. 100 each 12  . pravastatin (PRAVACHOL) 40 MG tablet Take 1 tablet (40 mg total) by mouth daily. 30 tablet 5  . valsartan (DIOVAN) 320 MG tablet TAKE 1 TABLET(320 MG) BY MOUTH DAILY 30 tablet 0  . cloNIDine (CATAPRES) 0.1 MG tablet Take 1 tablet (0.1 mg total) by mouth 2 (two) times daily. 60 tablet 11   No current facility-administered medications for this visit.      Allergies:   Trulicity [dulaglutide]  Social History:  The patient  reports that she has never smoked. She has never used smokeless tobacco. She reports that she does not drink alcohol or use drugs.   Family History:   family history includes Diabetes in her brother, brother, mother, and sister; Gout in her son; Hyperlipidemia in her father; Hypertension in her father; Kidney disease in her son; Stroke in her mother.    Review of Systems: Review of Systems  Constitutional: Negative.   Respiratory: Negative.   Cardiovascular: Positive for leg swelling.  Gastrointestinal: Negative.   Musculoskeletal: Negative.   Neurological: Negative.   Psychiatric/Behavioral: Negative.    All other systems reviewed and are negative.    PHYSICAL EXAM: VS:  BP (!) 158/88 (BP Location: Left Arm, Patient Position: Sitting, Cuff Size: Normal)   Pulse 74   Ht 5\' 7"  (1.702 m)   Wt 164 lb (74.4 kg)   LMP  (LMP Unknown)   BMI 25.69 kg/m  , BMI Body mass index is 25.69 kg/m. GEN: Well nourished, well developed, in no acute distress  HEENT: normal  Neck: no JVD, carotid bruits, or masses Cardiac: RRR; no murmurs, rubs, or gallops,1+ pitting edema to the mid shins edema  Unable to appreciate lower extremity pulses through her edema Respiratory:  clear to auscultation bilaterally, normal work of breathing GI: soft, nontender, nondistended, + BS MS: no deformity or atrophy  Skin: warm and dry, no rash Neuro:  Strength and sensation are intact Psych: euthymic mood, full affect    Recent Labs: 09/10/2016: ALT 11; BUN 32; Creatinine, Ser 1.47; Platelets 298; Potassium 4.8; Sodium 141    Lipid Panel Lab Results  Component Value Date   CHOL 239 (H) 09/10/2016   HDL 58 09/10/2016   LDLCALC 134 (H) 09/10/2016   TRIG 236 (H) 09/10/2016      Wt Readings from Last 3 Encounters:  05/04/17 164 lb (74.4 kg)  04/15/17 161 lb 9.6 oz (73.3 kg)  04/13/17 163 lb 6.4 oz (74.1 kg)       ASSESSMENT AND PLAN:  Cardiomegaly - Plan: EKG 12-Lead, ECHOCARDIOGRAM COMPLETE Seen on chest x-ray Given her lower extremity edema and abnormal EKG we have ordered echocardiogram  Leg swelling - Plan: ECHOCARDIOGRAM COMPLETE Etiology unclear, component of pitting edema concerning for CHF though unable to exclude component of venous insufficiency/dependent edema Recommended echocardiogram to estimate right heart pressures, ejection fraction    Poorly controlled type 2 diabetes mellitus with neuropathy (HCC)  dietary guide provided  We have stressed importance of low carbohydrate diet   Chronic kidney disease, stage 4, severely decreased GFR (HCC)  kidney disease likely secondary to  long-standing diabetes, hypertension  Already on ARB   Mixed hyperlipidemia  currently on a statin. This may need to be strengthened in follow-up visits to achieve goal LDL less than 70  Hypertension Possibly worsening leg swelling on amlodipine Discussed with her in detail, perhaps we could try alternatives We will start clonidine 0.1 mg twice a day Other options include isosorbide, hydralazine, Cardura   Total encounter time more than 45 minutes  Greater than 50% was spent in counseling and coordination of care with the patient   Disposition:   F/U  1 month   Orders Placed This Encounter  Procedures  . EKG 12-Lead  . ECHOCARDIOGRAM COMPLETE     Signed, Esmond Plants, M.D., Ph.D. 05/04/2017  [place], [place]

## 2017-05-04 ENCOUNTER — Ambulatory Visit (INDEPENDENT_AMBULATORY_CARE_PROVIDER_SITE_OTHER): Payer: Medicare Other | Admitting: Cardiovascular Disease

## 2017-05-04 ENCOUNTER — Encounter: Payer: Self-pay | Admitting: Cardiovascular Disease

## 2017-05-04 VITALS — BP 158/88 | HR 74 | Ht 67.0 in | Wt 164.0 lb

## 2017-05-04 DIAGNOSIS — E113393 Type 2 diabetes mellitus with moderate nonproliferative diabetic retinopathy without macular edema, bilateral: Secondary | ICD-10-CM | POA: Diagnosis not present

## 2017-05-04 DIAGNOSIS — I10 Essential (primary) hypertension: Secondary | ICD-10-CM | POA: Diagnosis not present

## 2017-05-04 DIAGNOSIS — I517 Cardiomegaly: Secondary | ICD-10-CM

## 2017-05-04 DIAGNOSIS — M7989 Other specified soft tissue disorders: Secondary | ICD-10-CM

## 2017-05-04 DIAGNOSIS — E782 Mixed hyperlipidemia: Secondary | ICD-10-CM

## 2017-05-04 DIAGNOSIS — E1165 Type 2 diabetes mellitus with hyperglycemia: Secondary | ICD-10-CM

## 2017-05-04 DIAGNOSIS — N184 Chronic kidney disease, stage 4 (severe): Secondary | ICD-10-CM | POA: Diagnosis not present

## 2017-05-04 DIAGNOSIS — E1140 Type 2 diabetes mellitus with diabetic neuropathy, unspecified: Secondary | ICD-10-CM

## 2017-05-04 DIAGNOSIS — E1122 Type 2 diabetes mellitus with diabetic chronic kidney disease: Secondary | ICD-10-CM | POA: Diagnosis not present

## 2017-05-04 DIAGNOSIS — N183 Chronic kidney disease, stage 3 (moderate): Secondary | ICD-10-CM | POA: Diagnosis not present

## 2017-05-04 DIAGNOSIS — Z794 Long term (current) use of insulin: Secondary | ICD-10-CM | POA: Diagnosis not present

## 2017-05-04 MED ORDER — CLONIDINE HCL 0.1 MG PO TABS: 0.1000 mg | ORAL_TABLET | Freq: Two times a day (BID) | ORAL | 11 refills | Status: DC

## 2017-05-04 NOTE — Patient Instructions (Addendum)
Medication Instructions:   Please stop the amlodipine  Start clonidine one pill twice a day   Labwork:  No new labs needed  Testing/Procedures:  We will order an echocardiogram for leg swelling, cardiomegaly Echocardiography is a painless test that uses sound waves to create images of your heart. It provides your doctor with information about the size and shape of your heart and how well your heart's chambers and valves are working. This procedure takes approximately one hour. There are no restrictions for this procedure.  Follow-Up: 1 month  If you need a refill on your cardiac medications before your next appointment, please call your pharmacy.    Echocardiogram An echocardiogram, or echocardiography, uses sound waves (ultrasound) to produce an image of your heart. The echocardiogram is simple, painless, obtained within a short period of time, and offers valuable information to your health care provider. The images from an echocardiogram can provide information such as:  Evidence of coronary artery disease (CAD).  Heart size.  Heart muscle function.  Heart valve function.  Aneurysm detection.  Evidence of a past heart attack.  Fluid buildup around the heart.  Heart muscle thickening.  Assess heart valve function. Tell a health care provider about:  Any allergies you have.  All medicines you are taking, including vitamins, herbs, eye drops, creams, and over-the-counter medicines.  Any problems you or family members have had with anesthetic medicines.  Any blood disorders you have.  Any surgeries you have had.  Any medical conditions you have.  Whether you are pregnant or may be pregnant. What happens before the procedure? No special preparation is needed. Eat and drink normally. What happens during the procedure?  In order to produce an image of your heart, gel will be applied to your chest and a wand-like tool (transducer) will be moved over your chest.  The gel will help transmit the sound waves from the transducer. The sound waves will harmlessly bounce off your heart to allow the heart images to be captured in real-time motion. These images will then be recorded.  You may need an IV to receive a medicine that improves the quality of the pictures. What happens after the procedure? You may return to your normal schedule including diet, activities, and medicines, unless your health care provider tells you otherwise. This information is not intended to replace advice given to you by your health care provider. Make sure you discuss any questions you have with your health care provider. Document Released: 11/28/2000 Document Revised: 07/19/2016 Document Reviewed: 08/08/2013 Elsevier Interactive Patient Education  2017 [employer].

## 2017-05-15 ENCOUNTER — Encounter (INDEPENDENT_AMBULATORY_CARE_PROVIDER_SITE_OTHER): Payer: Medicare Other | Admitting: Vascular Surgery

## 2017-05-29 ENCOUNTER — Ambulatory Visit (INDEPENDENT_AMBULATORY_CARE_PROVIDER_SITE_OTHER): Payer: Medicare Other

## 2017-05-29 ENCOUNTER — Other Ambulatory Visit: Payer: Self-pay

## 2017-05-29 ENCOUNTER — Telehealth: Payer: Self-pay | Admitting: Unknown Physician Specialty

## 2017-05-29 DIAGNOSIS — I517 Cardiomegaly: Secondary | ICD-10-CM | POA: Diagnosis not present

## 2017-05-29 DIAGNOSIS — M7989 Other specified soft tissue disorders: Secondary | ICD-10-CM | POA: Diagnosis not present

## 2017-05-29 NOTE — Telephone Encounter (Signed)
Called pt to schedule Annual Wellness Visit with [facility]  - knb  °

## 2017-06-15 ENCOUNTER — Other Ambulatory Visit: Payer: Self-pay | Admitting: Unknown Physician Specialty

## 2017-06-16 ENCOUNTER — Encounter (INDEPENDENT_AMBULATORY_CARE_PROVIDER_SITE_OTHER): Payer: Self-pay | Admitting: Vascular Surgery

## 2017-06-16 ENCOUNTER — Ambulatory Visit (INDEPENDENT_AMBULATORY_CARE_PROVIDER_SITE_OTHER): Payer: Medicare Other | Admitting: Vascular Surgery

## 2017-06-16 VITALS — BP 159/91 | HR 87 | Resp 15 | Ht 67.0 in | Wt 160.0 lb

## 2017-06-16 DIAGNOSIS — I10 Essential (primary) hypertension: Secondary | ICD-10-CM | POA: Diagnosis not present

## 2017-06-16 DIAGNOSIS — E1140 Type 2 diabetes mellitus with diabetic neuropathy, unspecified: Secondary | ICD-10-CM

## 2017-06-16 DIAGNOSIS — E1165 Type 2 diabetes mellitus with hyperglycemia: Secondary | ICD-10-CM | POA: Diagnosis not present

## 2017-06-16 DIAGNOSIS — R600 Localized edema: Secondary | ICD-10-CM | POA: Diagnosis not present

## 2017-06-16 DIAGNOSIS — N184 Chronic kidney disease, stage 4 (severe): Secondary | ICD-10-CM | POA: Diagnosis not present

## 2017-06-16 NOTE — Patient Instructions (Signed)

## 2017-06-16 NOTE — Assessment & Plan Note (Signed)
This could certainly contribute to her lower extremity swelling

## 2017-06-16 NOTE — Assessment & Plan Note (Signed)
The patient has lower extremity swelling that is likely multifactorial in its cause. She has pretty significant kidney disease as well as what sounds like heart issues. She does have some prominent varicosities and venous insufficiency could certainly be contributing

## 2017-06-16 NOTE — Assessment & Plan Note (Signed)
blood glucose control important in reducing the progression of atherosclerotic disease. Also, involved in wound healing. On appropriate medications.  

## 2017-06-16 NOTE — Assessment & Plan Note (Signed)
blood pressure control important in reducing the progression of atherosclerotic disease. On appropriate oral medications.  

## 2017-06-16 NOTE — Progress Notes (Signed)
Patient ID: Tonya [name], female   DOB: Apr 02, 1946, 71 y.o.   MRN: [id number]  Chief Complaint  Patient presents with  . New Evaluation    Bilateral leg swelling    HPI Tonya NUSSER is a 71 y.o. female.  I am asked to see the patient by Kathrine Haddock for evaluation of lower extremity swelling. The patient reports a long standing history of varicosities and they have become painful over time. There was no clear inciting event or causative factor that started the symptoms.  The right leg is more severly affected generally, but this has improved with diuretics and the left leg is actually worse today. The patient elevates the legs for relief. The pain is described as heaviness and aching. The symptoms are generally most severe in the evening, particularly when they have been on their feet for long periods of time. Diuretics and stockings have been used to try to improve the symptoms with limited success. The patient has no previous history of deep venous thrombosis or superficial thrombophlebitis to their knowledge.     Past Medical History:  Diagnosis Date  . Chronic kidney disease   . Diabetes mellitus without complication ([place])   . Hyperlipidemia   . Hypertension     Past Surgical History:  Procedure Laterality Date  . COLONOSCOPY  12/15/2006  . TUBAL LIGATION      Family History  Problem Relation Age of Onset  . Diabetes Mother   . Stroke Mother   . Hyperlipidemia Father   . Hypertension Father   . Diabetes Sister   . Diabetes Brother   . Gout Son   . Diabetes Brother   . Kidney disease Son   . Breast cancer Neg Hx     Social History Social History  Substance Use Topics  . Smoking status: Never Smoker  . Smokeless tobacco: Never Used  . Alcohol use No  No IVDU  Allergies  Allergen Reactions  . Trulicity [Dulaglutide] Hives    Current Outpatient Prescriptions  Medication Sig Dispense Refill  . amitriptyline (ELAVIL) 25 MG tablet Take 1 tablet (25 mg  total) by mouth at bedtime. 30 tablet 2  . amLODipine (NORVASC) 5 MG tablet Take 1 tablet (5 mg total) by mouth daily as needed. 30 tablet 3  . aspirin 81 MG tablet Take 81 mg by mouth daily.    . cloNIDine (CATAPRES) 0.1 MG tablet Take 1 tablet (0.1 mg total) by mouth 2 (two) times daily. 60 tablet 11  . furosemide (LASIX) 40 MG tablet Take 1 tablet (40 mg total) by mouth daily. 30 tablet 0  . Insulin Detemir (LEVEMIR FLEXPEN) 100 UNIT/ML Pen Inject 15-20 Units into the skin Nightly.    . Insulin Pen Needle (NOVOFINE) 30G X 8 MM MISC Inject 10 each into the skin 3 (three) times daily. E11.40 100 each 12  . lidocaine (XYLOCAINE) 5 % ointment APPLY EXTERNALLY TO THE AFFECTED AREA AS NEEDED 30 g 0  . metFORMIN (GLUCOPHAGE-XR) 500 MG 24 hr tablet Take 1,000 mg by mouth daily.    . metoprolol succinate (TOPROL-XL) 50 MG 24 hr tablet TAKE 1 TABLET BY MOUTH EVERY DAY. 30 tablet 0  . omeprazole (PRILOSEC) 20 MG capsule TAKE 1 CAPSULE(20 MG) BY MOUTH TWICE DAILY 60 capsule 0  . ONE TOUCH ULTRA TEST test strip USE TO TEST BLOOD SUGAR THREE TIMES DAILY. 100 each 12  . pravastatin (PRAVACHOL) 40 MG tablet Take 1 tablet (40 mg total) by mouth  daily. 30 tablet 5  . valsartan (DIOVAN) 320 MG tablet TAKE 1 TABLET(320 MG) BY MOUTH DAILY 30 tablet 0   No current facility-administered medications for this visit.       REVIEW OF SYSTEMS (Negative unless checked)  Constitutional: [] Weight loss  [] Fever  [] Chills Cardiac: [] Chest pain   [] Chest pressure   [] Palpitations   [] Shortness of breath when laying flat   [] Shortness of breath at rest   [] Shortness of breath with exertion. Vascular:  [] Pain in legs with walking   [] Pain in legs at rest   [] Pain in legs when laying flat   [] Claudication   [] Pain in feet when walking  [] Pain in feet at rest  [] Pain in feet when laying flat   [] History of DVT   [] Phlebitis   [x] Swelling in legs   [x] Varicose veins   [] Non-healing ulcers Pulmonary:   [] Uses home oxygen    [] Productive cough   [] Hemoptysis   [] Wheeze  [] COPD   [] Asthma Neurologic:  [] Dizziness  [] Blackouts   [] Seizures   [] History of stroke   [] History of TIA  [] Aphasia   [] Temporary blindness   [] Dysphagia   [] Weakness or numbness in arms   [] Weakness or numbness in legs Musculoskeletal:  [x] Arthritis   [] Joint swelling   [] Joint pain   [] Low back pain Hematologic:  [] Easy bruising  [] Easy bleeding   [] Hypercoagulable state   [] Anemic  [] Hepatitis Gastrointestinal:  [] Blood in stool   [] Vomiting blood  [] Gastroesophageal reflux/heartburn   [] Abdominal pain Genitourinary:  [x] Chronic kidney disease   [] Difficult urination  [] Frequent urination  [] Burning with urination   [] Hematuria Skin:  [] Rashes   [] Ulcers   [] Wounds Psychological:  [] History of anxiety   []  History of major depression.    Physical Exam BP (!) 159/91 (BP Location: Right Arm)   Pulse 87   Resp 15   Ht 5\' 7"  (1.702 m)   Wt 72.6 kg (160 lb)   LMP  (LMP Unknown)   BMI 25.06 kg/m  Gen:  WD/WN, NAD Head: [place]/AT, No temporalis wasting.  Ear/Nose/Throat: Hearing grossly intact, dentition good Eyes: Sclera non-icteric. Conjunctiva clear Neck: Supple, no nuchal rigidity. Trachea midline Pulmonary:  Good air movement, no use of accessory muscles, respirations not labored.  Cardiac: RRR, No JVD Vascular: Varicosities scattered and measuring up to 1-2 mm in the right lower extremity        Varicosities diffuse and measuring up to 2-3 mm in the left lower extremity Vessel Right Left  Radial Palpable Palpable  Ulnar Palpable Palpable  Brachial Palpable Palpable  Carotid Palpable, without bruit Palpable, without bruit  Aorta Not palpable N/A  Femoral Palpable Palpable  Popliteal Palpable Palpable  PT 1+ Palpable 1+ Palpable  DP Palpable Palpable   Gastrointestinal: soft, non-tender/non-distended. Musculoskeletal: M/S 5/5 throughout.   Trace RLE edema.  1 + LLE edema Neurologic: Sensation grossly intact in extremities.   Symmetrical.  Speech is fluent.  Psychiatric: Judgment intact, Mood & affect appropriate for pt's clinical situation. Dermatologic: No rashes or ulcers noted.  No cellulitis or open wounds.    Radiology No results found.  Labs No results found for this or any previous visit (from the past 2160 hour(s)).  Assessment/Plan:  Essential hypertension, benign blood pressure control important in reducing the progression of atherosclerotic disease. On appropriate oral medications.   Poorly controlled type 2 diabetes mellitus with neuropathy blood glucose control important in reducing the progression of atherosclerotic disease. Also, involved in wound healing. On appropriate medications.  Chronic kidney disease, stage 4, severely decreased GFR This could certainly contribute to her lower extremity swelling  Bilateral leg edema The patient has lower extremity swelling that is likely multifactorial in its cause. She has pretty significant kidney disease as well as what sounds like heart issues. She does have some prominent varicosities and venous insufficiency could certainly be contributing    The patient has symptoms consistent with chronic venous insufficiency. We discussed the natural history and treatment options for venous disease. I recommended the regular use of 20 - 30 mm Hg compression stockings, and prescribed these today. I recommended leg elevation and anti-inflammatories as needed for pain. I have also recommended a complete venous duplex to assess the venous system for reflux or thrombotic issues. This can be done at the patient's convenience. I will see the patient back in 3 months to assess the response to conservative management, and determine further treatment options.     Leotis Pain 06/16/2017, 10:43 AM   This note was created with Dragon medical transcription system.  Any errors from dictation are unintentional.

## 2017-06-18 ENCOUNTER — Ambulatory Visit: Payer: No Typology Code available for payment source | Admitting: Cardiovascular Disease

## 2017-06-24 ENCOUNTER — Ambulatory Visit (INDEPENDENT_AMBULATORY_CARE_PROVIDER_SITE_OTHER): Payer: Medicare Other

## 2017-06-24 VITALS — BP 128/74 | HR 76 | Temp 97.8°F | Resp 16 | Ht 67.0 in | Wt 163.8 lb

## 2017-06-24 DIAGNOSIS — Z0000 Encounter for general adult medical examination without abnormal findings: Secondary | ICD-10-CM | POA: Diagnosis not present

## 2017-06-24 DIAGNOSIS — Z1211 Encounter for screening for malignant neoplasm of colon: Secondary | ICD-10-CM

## 2017-06-24 NOTE — Progress Notes (Signed)
Subjective:   Tonya [name] is a 71 y.o. female who presents for Medicare Annual (Subsequent) preventive examination.  Review of Systems:   Cardiac Risk Factors include: advanced age (>57men, >63 women);diabetes mellitus;hypertension;dyslipidemia     Objective:     Vitals: BP 128/74 (BP Location: Left Arm, Patient Position: Sitting)   Pulse 76   Temp 97.8 F (36.6 C)   Resp 16   Ht 5\' 7"  (1.702 m)   Wt 163 lb 12.8 oz (74.3 kg)   LMP  (LMP Unknown)   BMI 25.65 kg/m   Body mass index is 25.65 kg/m.   Tobacco History  Smoking Status  . Never Smoker  Smokeless Tobacco  . Never Used     Counseling given: Not Answered   Past Medical History:  Diagnosis Date  . Chronic kidney disease   . Diabetes mellitus without complication ([place])   . Hyperlipidemia   . Hypertension    Past Surgical History:  Procedure Laterality Date  . COLONOSCOPY  12/15/2006  . TUBAL LIGATION     Family History  Problem Relation Age of Onset  . Diabetes Mother   . Stroke Mother   . Hyperlipidemia Father   . Hypertension Father   . Diabetes Sister   . Diabetes Brother   . Gout Son   . Diabetes Brother   . Kidney disease Son   . Breast cancer Neg Hx    History  Sexual Activity  . Sexual activity: Yes    Outpatient Encounter Prescriptions as of 06/24/2017  Medication Sig  . amLODipine (NORVASC) 5 MG tablet Take 1 tablet (5 mg total) by mouth daily as needed.  [name] aspirin 81 MG tablet Take 81 mg by mouth daily.  . cloNIDine (CATAPRES) 0.1 MG tablet Take 1 tablet (0.1 mg total) by mouth 2 (two) times daily.  . furosemide (LASIX) 40 MG tablet Take 1 tablet (40 mg total) by mouth daily.  . Insulin Detemir (LEVEMIR FLEXPEN) 100 UNIT/ML Pen Inject 15-20 Units into the skin Nightly.  . Insulin Pen Needle (NOVOFINE) 30G X 8 MM MISC Inject 10 each into the skin 3 (three) times daily. E11.40  . lidocaine (XYLOCAINE) 5 % ointment APPLY EXTERNALLY TO THE AFFECTED AREA AS NEEDED  . metFORMIN  (GLUCOPHAGE-XR) 500 MG 24 hr tablet Take 1,000 mg by mouth daily.  . metoprolol succinate (TOPROL-XL) 50 MG 24 hr tablet TAKE 1 TABLET BY MOUTH EVERY DAY.  [name] omeprazole (PRILOSEC) 20 MG capsule TAKE 1 CAPSULE(20 MG) BY MOUTH TWICE DAILY  . ONE TOUCH ULTRA TEST test strip USE TO TEST BLOOD SUGAR THREE TIMES DAILY.  . pravastatin (PRAVACHOL) 40 MG tablet Take 1 tablet (40 mg total) by mouth daily.  . valsartan (DIOVAN) 320 MG tablet TAKE 1 TABLET(320 MG) BY MOUTH DAILY  . amitriptyline (ELAVIL) 25 MG tablet Take 1 tablet (25 mg total) by mouth at bedtime. (Patient not taking: Reported on 06/24/2017)   No facility-administered encounter medications on file as of 06/24/2017.     Activities of Daily Living In your present state of health, do you have any difficulty performing the following activities: 06/24/2017 10/10/2016  Hearing? N N  Vision? Y Y  Difficulty concentrating or making decisions? Y N  Walking or climbing stairs? Y Y  Dressing or bathing? N N  Doing errands, shopping? N N  Preparing Food and eating ? N -  Using the Toilet? N -  In the past six months, have you accidently leaked urine? N -  Do you have problems with loss of bowel control? N -  Managing your Medications? N -  Managing your Finances? N -  Housekeeping or managing your Housekeeping? N -  Some recent data might be hidden    Patient Care Team: Kathrine Haddock, NP as PCP - General (Nurse Practitioner) Judi Cong, MD as Physician Assistant (Endocrinology) Lavonia Dana, MD as Consulting Physician (Nephrology) [name], MD as Consulting Physician (Cardiology)    Assessment:     Exercise Activities and Dietary recommendations Current Exercise Habits: The patient does not participate in regular exercise at present, Exercise limited by: None identified  Goals    . Increase water intake          Recommend drinking at least 4-5 glasses of water a day      Fall Risk Fall Risk  06/24/2017  10/10/2016 09/10/2016  Falls in the past year? No No No   Depression Screen PHQ 2/9 Scores 06/24/2017 10/10/2016 09/10/2016  PHQ - 2 Score 0 0 0  PHQ- 9 Score - - 2     Cognitive Function        Immunization History  Administered Date(s) Administered  . Influenza, High Dose Seasonal PF 09/10/2016  . Influenza,inj,Quad PF,36+ Mos 09/18/2015  . PPD Test 07/15/2016, 07/29/2016  . Pneumococcal Conjugate-13 06/07/2014  . Pneumococcal Polysaccharide-23 09/07/2013  . Td 06/12/2005  . Tdap 10/10/2016  . Zoster 09/13/2013   Screening Tests Health Maintenance  Topic Date Due  . COLONOSCOPY  12/15/2016  . HEMOGLOBIN A1C  03/10/2017  . INFLUENZA VACCINE  07/15/2017  . FOOT EXAM  09/10/2017  . OPHTHALMOLOGY EXAM  01/06/2018  . MAMMOGRAM  03/12/2019  . TETANUS/TDAP  10/10/2026  . DEXA SCAN  Completed  . Hepatitis C Screening  Completed  . PNA vac Low Risk Adult  Completed      Plan:    I have personally reviewed and addressed the Medicare Annual Wellness questionnaire and have noted the following in the patient's chart:  A. Medical and social history B. Use of alcohol, tobacco or illicit drugs  C. Current medications and supplements D. Functional ability and status E.  Nutritional status F.  Physical activity G. Advance directives H. List of other physicians I.  Hospitalizations, surgeries, and ER visits in previous 12 months J.  [place] such as hearing and vision if needed, cognitive and depression L. Referrals and appointments  In addition, I have reviewed and discussed with patient certain preventive protocols, quality metrics, and best practice recommendations. A written personalized care plan for preventive services as well as general preventive health recommendations were provided to patient.   Signed,  Tyler Aas, LPN Nurse Health Advisor   MD Recommendations: none

## 2017-06-24 NOTE — Patient Instructions (Addendum)
Tonya [name] , Thank you for taking time to come for your Medicare Wellness Visit. I appreciate your ongoing commitment to your health goals. Please review the following plan we discussed and let me know if I can assist you in the future.   Screening recommendations/referrals: Colonoscopy: completed 12/15/2006, cologuard ordered today Mammogram: completed 03/11/2017 Bone Density: completed 03/11/2017 Recommended yearly ophthalmology/optometry visit for glaucoma screening and checkup Recommended yearly dental visit for hygiene and checkup  Vaccinations: Influenza vaccine: up to date, due 08/2017 Pneumococcal vaccine: up to date Tdap vaccine: up to date Shingles vaccine: up to date  Advanced directives: Advance directive discussed with you today. I have provided a copy for you to complete at home and have notarized. Once this is complete please bring a copy in to our office so we can scan it into your chart.  Conditions/risks identified: Recommend drinking at least 4-5 glasses of water a day  Next appointment: Follow up on 07/14/2017 at 10:00am with Regino Schultze. Follow up in one year for your annual wellness exam.   Preventive Care 65 Years and Older, Female Preventive care refers to lifestyle choices and visits with your health care provider that can promote health and wellness. What does preventive care include?  A yearly physical exam. This is also called an annual well check.  Dental exams once or twice a year.  Routine eye exams. Ask your health care provider how often you should have your eyes checked.  Personal lifestyle choices, including:  Daily care of your teeth and gums.  Regular physical activity.  Eating a healthy diet.  Avoiding tobacco and drug use.  Limiting alcohol use.  Practicing safe sex.  Taking low-dose aspirin every day.  Taking vitamin and mineral supplements as recommended by your health care provider. What happens during an annual well  check? The services and screenings done by your health care provider during your annual well check will depend on your age, overall health, lifestyle risk factors, and family history of disease. Counseling  Your health care provider may ask you questions about your:  Alcohol use.  Tobacco use.  Drug use.  Emotional well-being.  Home and relationship well-being.  Sexual activity.  Eating habits.  History of falls.  Memory and ability to understand (cognition).  Work and work Statistician.  Reproductive health. Screening  You may have the following tests or measurements:  Height, weight, and BMI.  Blood pressure.  Lipid and cholesterol levels. These may be checked every 5 years, or more frequently if you are over 56 years old.  Skin check.  Lung cancer screening. You may have this screening every year starting at age 69 if you have a 30-pack-year history of smoking and currently smoke or have quit within the past 15 years.  Fecal occult blood test (FOBT) of the stool. You may have this test every year starting at age 44.  Flexible sigmoidoscopy or colonoscopy. You may have a sigmoidoscopy every 5 years or a colonoscopy every 10 years starting at age 24.  Hepatitis C blood test.  Hepatitis B blood test.  Sexually transmitted disease (STD) testing.  Diabetes screening. This is done by checking your blood sugar (glucose) after you have not eaten for a while (fasting). You may have this done every 1-3 years.  Bone density scan. This is done to screen for osteoporosis. You may have this done starting at age 1.  Mammogram. This may be done every 1-2 years. Talk to your health care provider about how often  you should have regular mammograms. Talk with your health care provider about your test results, treatment options, and if necessary, the need for more tests. Vaccines  Your health care provider may recommend certain vaccines, such as:  Influenza vaccine. This is  recommended every year.  Tetanus, diphtheria, and acellular pertussis (Tdap, Td) vaccine. You may need a Td booster every 10 years.  Zoster vaccine. You may need this after age 61.  Pneumococcal 13-valent conjugate (PCV13) vaccine. One dose is recommended after age 62.  Pneumococcal polysaccharide (PPSV23) vaccine. One dose is recommended after age 23. Talk to your health care provider about which screenings and vaccines you need and how often you need them. This information is not intended to replace advice given to you by your health care provider. Make sure you discuss any questions you have with your health care provider. Document Released: 12/28/2015 Document Revised: 08/20/2016 Document Reviewed: 10/02/2015 Elsevier Interactive Patient Education  2017 [place] Prevention in the Home Falls can cause injuries. They can happen to people of all ages. There are many things you can do to make your home safe and to help prevent falls. What can I do on the outside of my home?  Regularly fix the edges of walkways and driveways and fix any cracks.  Remove anything that might make you trip as you walk through a door, such as a raised step or threshold.  Trim any bushes or trees on the path to your home.  Use bright outdoor lighting.  Clear any walking paths of anything that might make someone trip, such as rocks or tools.  Regularly check to see if handrails are loose or broken. Make sure that both sides of any steps have handrails.  Any raised decks and porches should have guardrails on the edges.  Have any leaves, snow, or ice cleared regularly.  Use sand or salt on walking paths during winter.  Clean up any spills in your garage right away. This includes oil or grease spills. What can I do in the bathroom?  Use night lights.  Install grab bars by the toilet and in the tub and shower. Do not use towel bars as grab bars.  Use non-skid mats or decals in the tub or  shower.  If you need to sit down in the shower, use a plastic, non-slip stool.  Keep the floor dry. Clean up any water that spills on the floor as soon as it happens.  Remove soap buildup in the tub or shower regularly.  Attach bath mats securely with double-sided non-slip rug tape.  Do not have throw rugs and other things on the floor that can make you trip. What can I do in the bedroom?  Use night lights.  Make sure that you have a light by your bed that is easy to reach.  Do not use any sheets or blankets that are too big for your bed. They should not hang down onto the floor.  Have a firm chair that has side arms. You can use this for support while you get dressed.  Do not have throw rugs and other things on the floor that can make you trip. What can I do in the kitchen?  Clean up any spills right away.  Avoid walking on wet floors.  Keep items that you use a lot in easy-to-reach places.  If you need to reach something above you, use a strong step stool that has a grab bar.  Keep electrical cords  out of the way.  Do not use floor polish or wax that makes floors slippery. If you must use wax, use non-skid floor wax.  Do not have throw rugs and other things on the floor that can make you trip. What can I do with my stairs?  Do not leave any items on the stairs.  Make sure that there are handrails on both sides of the stairs and use them. Fix handrails that are broken or loose. Make sure that handrails are as long as the stairways.  Check any carpeting to make sure that it is firmly attached to the stairs. Fix any carpet that is loose or worn.  Avoid having throw rugs at the top or bottom of the stairs. If you do have throw rugs, attach them to the floor with carpet tape.  Make sure that you have a light switch at the top of the stairs and the bottom of the stairs. If you do not have them, ask someone to add them for you. What else can I do to help prevent  falls?  Wear shoes that:  Do not have high heels.  Have rubber bottoms.  Are comfortable and fit you well.  Are closed at the toe. Do not wear sandals.  If you use a stepladder:  Make sure that it is fully opened. Do not climb a closed stepladder.  Make sure that both sides of the stepladder are locked into place.  Ask someone to hold it for you, if possible.  Clearly mark and make sure that you can see:  Any grab bars or handrails.  First and last steps.  Where the edge of each step is.  Use tools that help you move around (mobility aids) if they are needed. These include:  Canes.  Walkers.  Scooters.  Crutches.  Turn on the lights when you go into a dark area. Replace any light bulbs as soon as they burn out.  Set up your furniture so you have a clear path. Avoid moving your furniture around.  If any of your floors are uneven, fix them.  If there are any pets around you, be aware of where they are.  Review your medicines with your doctor. Some medicines can make you feel dizzy. This can increase your chance of falling. Ask your doctor what other things that you can do to help prevent falls. This information is not intended to replace advice given to you by your health care provider. Make sure you discuss any questions you have with your health care provider. Document Released: 09/27/2009 Document Revised: 05/08/2016 Document Reviewed: 01/05/2015 Elsevier Interactive Patient Education  2017 [employer].

## 2017-07-08 DIAGNOSIS — E1165 Type 2 diabetes mellitus with hyperglycemia: Secondary | ICD-10-CM | POA: Diagnosis not present

## 2017-07-08 DIAGNOSIS — Z794 Long term (current) use of insulin: Secondary | ICD-10-CM | POA: Diagnosis not present

## 2017-07-14 ENCOUNTER — Ambulatory Visit (INDEPENDENT_AMBULATORY_CARE_PROVIDER_SITE_OTHER): Payer: Medicare Other | Admitting: Unknown Physician Specialty

## 2017-07-14 ENCOUNTER — Other Ambulatory Visit: Payer: Self-pay

## 2017-07-14 ENCOUNTER — Encounter: Payer: Self-pay | Admitting: Unknown Physician Specialty

## 2017-07-14 VITALS — BP 158/80 | HR 94 | Temp 97.3°F | Ht 67.0 in | Wt 160.6 lb

## 2017-07-14 DIAGNOSIS — E782 Mixed hyperlipidemia: Secondary | ICD-10-CM | POA: Diagnosis not present

## 2017-07-14 DIAGNOSIS — E1140 Type 2 diabetes mellitus with diabetic neuropathy, unspecified: Secondary | ICD-10-CM

## 2017-07-14 DIAGNOSIS — I129 Hypertensive chronic kidney disease with stage 1 through stage 4 chronic kidney disease, or unspecified chronic kidney disease: Secondary | ICD-10-CM

## 2017-07-14 DIAGNOSIS — R5383 Other fatigue: Secondary | ICD-10-CM

## 2017-07-14 DIAGNOSIS — E559 Vitamin D deficiency, unspecified: Secondary | ICD-10-CM

## 2017-07-14 DIAGNOSIS — Z0000 Encounter for general adult medical examination without abnormal findings: Secondary | ICD-10-CM

## 2017-07-14 DIAGNOSIS — E1165 Type 2 diabetes mellitus with hyperglycemia: Secondary | ICD-10-CM

## 2017-07-14 MED ORDER — ATORVASTATIN CALCIUM 20 MG PO TABS: 20.0000 mg | ORAL_TABLET | Freq: Every day | ORAL | 3 refills | Status: DC

## 2017-07-14 NOTE — Progress Notes (Signed)
BP (!) 158/80   Pulse 94   Temp (!) 97.3 F (36.3 C)   Ht 5\' 7"  (1.702 m)   Wt 160 lb 9.6 oz (72.8 kg)   LMP  (LMP Unknown)   SpO2 97%   BMI 25.15 kg/m    Subjective:    Patient ID: Tonya [name], female    DOB: September 22, 1946, 71 y.o.   MRN: [id number]  HPI: Tonya [name] is a 71 y.o. female  Chief Complaint  Patient presents with  . Annual Exam    pt had Wellness visit with [facility] 06/24/17   Diabetes: Per endocrine.  Taking 20 units of Levimir.  Last Hgb A1C was 10.9  CKD GFR is 34.  Seeing nephrology  Hypertension  Using medications without difficulty Average home BPs took it there other day and SBP was 127   Using medication without problems or lightheadedness No chest pain with exertion or shortness of breath No Edema  Elevated Cholesterol Not taking Pravastatin as she states it gives her heartburn.   No Muscle aches  Diet: Exercise:  States she feels weak and tired all the time.  Concerned as both brother and sister had Lymphoma  Family History  Problem Relation Age of Onset  . Diabetes Mother   . Stroke Mother   . Hyperlipidemia Father   . Hypertension Father   . Diabetes Sister   . Diabetes Brother   . Gout Son   . Diabetes Brother   . Kidney disease Son   . Breast cancer Neg Hx    Social History   Social History  . Marital status: Single    Spouse name: N/A  . Number of children: N/A  . Years of education: N/A   Occupational History  . Not on file.   Social History Main Topics  . Smoking status: Never Smoker  . Smokeless tobacco: Never Used  . Alcohol use No  . Drug use: No  . Sexual activity: Yes   Other Topics Concern  . Not on file   Social History Narrative  . No narrative on file   Past Medical History:  Diagnosis Date  . Chronic kidney disease   . Diabetes mellitus without complication ([place])   . Hyperlipidemia   . Hypertension    Past Surgical History:  Procedure Laterality Date  . COLONOSCOPY  12/15/2006  . TUBAL  LIGATION      Relevant past medical, surgical, family and social history reviewed and updated as indicated. Interim medical history since our last visit reviewed. Allergies and medications reviewed and updated.  Review of Systems  Genitourinary: Positive for frequency.    Per HPI unless specifically indicated above     Objective:    BP (!) 158/80   Pulse 94   Temp (!) 97.3 F (36.3 C)   Ht 5\' 7"  (1.702 m)   Wt 160 lb 9.6 oz (72.8 kg)   LMP  (LMP Unknown)   SpO2 97%   BMI 25.15 kg/m   Wt Readings from Last 3 Encounters:  07/14/17 160 lb 9.6 oz (72.8 kg)  06/24/17 163 lb 12.8 oz (74.3 kg)  06/16/17 160 lb (72.6 kg)    Physical Exam  Constitutional: She is oriented to person, place, and time. She appears well-developed and well-nourished.  HENT:  Head: Normocephalic and atraumatic.  Eyes: Pupils are equal, round, and reactive to light. Right eye exhibits no discharge. Left eye exhibits no discharge. No scleral icterus.  Neck: Normal range of motion.  Neck supple. Carotid bruit is not present. No thyromegaly present.  Cardiovascular: Normal rate, regular rhythm and normal heart sounds.  Exam reveals no gallop and no friction rub.   No murmur heard. Pulmonary/Chest: Effort normal and breath sounds normal. No respiratory distress. She has no wheezes. She has no rales.  Abdominal: Soft. Bowel sounds are normal. There is no tenderness. There is no rebound.  Genitourinary: No breast swelling, tenderness or discharge.  Musculoskeletal: Normal range of motion.  Lymphadenopathy:    She has no cervical adenopathy.  Neurological: She is alert and oriented to person, place, and time.  Skin: Skin is warm, dry and intact. No rash noted.  Psychiatric: She has a normal mood and affect. Her speech is normal and behavior is normal. Judgment and thought content normal. Cognition and memory are normal.    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  Improved BP of 157/70 on recheck.  Stressed compliance.  Also follow with Nephrology      Mixed hyperlipidemia    Reviewed labs from [facility] and noted LDL was 160.  Not taking statin as it gives her heartburn.  Change to Atorvastatin.        Relevant Medications   atorvastatin (LIPITOR) 20 MG tablet   Poorly controlled type 2 diabetes mellitus with neuropathy (HCC)    Poor control with review of notes.  Pt seems confused with insulin dosing and on 20 units though not working.  She will discuss with Dr. [name]      Relevant Medications   atorvastatin (LIPITOR) 20 MG tablet    Other Visit Diagnoses    Fatigue, unspecified type    -  Primary   Relevant Orders   TSH   CBC with Differential/Platelet   Annual physical exam       Vitamin D deficiency           Follow up plan: Return in about 3 months (around 10/14/2017).

## 2017-07-14 NOTE — Assessment & Plan Note (Signed)
Seeing nephrology.  Improved BP of 157/70 on recheck.  Stressed compliance.  Also follow with Nephrology

## 2017-07-14 NOTE — Assessment & Plan Note (Signed)
Reviewed labs from [facility] and noted LDL was 160.  Not taking statin as it gives her heartburn.  Change to Atorvastatin.

## 2017-07-14 NOTE — Assessment & Plan Note (Signed)
Poor control with review of notes.  Pt seems confused with insulin dosing and on 20 units though not working.  She will discuss with Dr. [name]

## 2017-07-15 ENCOUNTER — Encounter: Payer: Self-pay | Admitting: Unknown Physician Specialty

## 2017-07-15 DIAGNOSIS — E1122 Type 2 diabetes mellitus with diabetic chronic kidney disease: Secondary | ICD-10-CM | POA: Diagnosis not present

## 2017-07-15 DIAGNOSIS — E1121 Type 2 diabetes mellitus with diabetic nephropathy: Secondary | ICD-10-CM | POA: Diagnosis not present

## 2017-07-15 DIAGNOSIS — I10 Essential (primary) hypertension: Secondary | ICD-10-CM | POA: Diagnosis not present

## 2017-07-15 DIAGNOSIS — E1165 Type 2 diabetes mellitus with hyperglycemia: Secondary | ICD-10-CM | POA: Diagnosis not present

## 2017-07-15 DIAGNOSIS — E113393 Type 2 diabetes mellitus with moderate nonproliferative diabetic retinopathy without macular edema, bilateral: Secondary | ICD-10-CM | POA: Diagnosis not present

## 2017-07-15 DIAGNOSIS — N183 Chronic kidney disease, stage 3 (moderate): Secondary | ICD-10-CM | POA: Diagnosis not present

## 2017-07-15 DIAGNOSIS — Z794 Long term (current) use of insulin: Secondary | ICD-10-CM | POA: Diagnosis not present

## 2017-07-15 LAB — CBC WITH DIFFERENTIAL/PLATELET
Basophils Absolute: 0 10*3/uL (ref 0.0–0.2)
Basos: 0 %
EOS (ABSOLUTE): 0.1 10*3/uL (ref 0.0–0.4)
Eos: 1 %
HEMATOCRIT: 34.4 % (ref 34.0–46.6)
Hemoglobin: 10.4 g/dL — ABNORMAL LOW (ref 11.1–15.9)
IMMATURE GRANULOCYTES: 0 %
Immature Grans (Abs): 0 10*3/uL (ref 0.0–0.1)
LYMPHS ABS: 2 10*3/uL (ref 0.7–3.1)
Lymphs: 24 %
MCH: 26.3 pg — AB (ref 26.6–33.0)
MCHC: 30.2 g/dL — AB (ref 31.5–35.7)
MCV: 87 fL (ref 79–97)
MONOS ABS: 0.4 10*3/uL (ref 0.1–0.9)
Monocytes: 5 %
NEUTROS PCT: 70 %
Neutrophils Absolute: 5.7 10*3/uL (ref 1.4–7.0)
Platelets: 307 10*3/uL (ref 150–379)
RBC: 3.95 x10E6/uL (ref 3.77–5.28)
RDW: 14.9 % (ref 12.3–15.4)
WBC: 8.2 10*3/uL (ref 3.4–10.8)

## 2017-07-15 LAB — TSH: TSH: 2.18 u[IU]/mL (ref 0.450–4.500)

## 2017-07-16 NOTE — Progress Notes (Deleted)
Cardiology Office Note  Date:  07/16/2017   ID:  Tonya [name], DOB 27-Nov-1946, MRN [id number]  PCP:  Kathrine Haddock, NP   No chief complaint on file.   HPI:  71 yo woman with past medical hx of  DM, HBA1C 13 down to 11.8 Chronic Leg edema HTN Hyperlipidemia Chronic kidney disease, followed by nephrology chest x-ray suggesting   mild CHF, on lasix Presenting for f/u of her  leg swelling  She denies any significant shortness of breath on exertion Reports having chronic leg swelling Was sent for chest x-ray by primary care, this showed cardiomegaly possible urinary edema Consistently taking  Her Lasix 40 mg daily Possible worsening of her leg swelling on amlodipine  denies any chest pain concerning for angina    she is trying to watch her diet Recent increase in metformin, diabetes managed by endocrinology  Previous total cholesterol 239, LDL 134 with hemoglobin A1c at that time of 13 Creatinine 1.47 with BUN 32  EKG personally reviewed by myself on todays visit Shows normal sinus rhythm with rate 73 bpm T wave abnormality V3 through V6,  1 and aVL, left axis deviation. No previous EKGs available for review    PMH:   has a past medical history of Chronic kidney disease; Diabetes mellitus without complication ([place]); Hyperlipidemia; and Hypertension.  PSH:    Past Surgical History:  Procedure Laterality Date  . COLONOSCOPY  12/15/2006  . TUBAL LIGATION      Current Outpatient Prescriptions  Medication Sig Dispense Refill  . amLODipine (NORVASC) 5 MG tablet Take 1 tablet (5 mg total) by mouth daily as needed. 30 tablet 3  . aspirin 81 MG tablet Take 81 mg by mouth daily.    [name] atorvastatin (LIPITOR) 20 MG tablet Take 1 tablet (20 mg total) by mouth daily. 90 tablet 3  . cloNIDine (CATAPRES) 0.1 MG tablet Take 1 tablet (0.1 mg total) by mouth 2 (two) times daily. 60 tablet 11  . furosemide (LASIX) 40 MG tablet Take 1 tablet (40 mg total) by mouth daily. 30 tablet 0  .  Insulin Detemir (LEVEMIR FLEXPEN) 100 UNIT/ML Pen Inject 15-20 Units into the skin Nightly.    . Insulin Pen Needle (NOVOFINE) 30G X 8 MM MISC Inject 10 each into the skin 3 (three) times daily. E11.40 100 each 12  . lidocaine (XYLOCAINE) 5 % ointment APPLY EXTERNALLY TO THE AFFECTED AREA AS NEEDED 30 g 0  . metFORMIN (GLUCOPHAGE-XR) 500 MG 24 hr tablet Take 1,000 mg by mouth daily.    . metoprolol succinate (TOPROL-XL) 50 MG 24 hr tablet TAKE 1 TABLET BY MOUTH EVERY DAY. 30 tablet 0  . NOVOFINE 32G X 6 MM MISC     . omeprazole (PRILOSEC) 20 MG capsule TAKE 1 CAPSULE(20 MG) BY MOUTH TWICE DAILY 60 capsule 0  . ONE TOUCH ULTRA TEST test strip USE TO TEST BLOOD SUGAR THREE TIMES DAILY. 100 each 12  . valsartan (DIOVAN) 320 MG tablet TAKE 1 TABLET(320 MG) BY MOUTH DAILY 30 tablet 0   No current facility-administered medications for this visit.      Allergies:   Trulicity [dulaglutide]   Social History:  The patient  reports that she has never smoked. She has never used smokeless tobacco. She reports that she does not drink alcohol or use drugs.   Family History:   family history includes Diabetes in her brother, brother, mother, and sister; Gout in her son; Hyperlipidemia in her father; Hypertension in her father;  Kidney disease in her son; Stroke in her mother.    Review of Systems: Review of Systems  Constitutional: Negative.   Respiratory: Negative.   Cardiovascular: Positive for leg swelling.  Gastrointestinal: Negative.   Musculoskeletal: Negative.   Neurological: Negative.   Psychiatric/Behavioral: Negative.   All other systems reviewed and are negative.    PHYSICAL EXAM: VS:  LMP  (LMP Unknown)  , BMI There is no height or weight on file to calculate BMI. GEN: Well nourished, well developed, in no acute distress  HEENT: normal  Neck: no JVD, carotid bruits, or masses Cardiac: RRR; no murmurs, rubs, or gallops,1+ pitting edema to the mid shins edema  Unable to appreciate  lower extremity pulses through her edema Respiratory:  clear to auscultation bilaterally, normal work of breathing GI: soft, nontender, nondistended, + BS MS: no deformity or atrophy  Skin: warm and dry, no rash Neuro:  Strength and sensation are intact Psych: euthymic mood, full affect    Recent Labs: 09/10/2016: ALT 11; BUN 32; Creatinine, Ser 1.47; Potassium 4.8; Sodium 141 07/14/2017: Hemoglobin 10.4; Platelets 307; TSH 2.180    Lipid Panel Lab Results  Component Value Date   CHOL 239 (H) 09/10/2016   HDL 58 09/10/2016   LDLCALC 134 (H) 09/10/2016   TRIG 236 (H) 09/10/2016      Wt Readings from Last 3 Encounters:  07/14/17 160 lb 9.6 oz (72.8 kg)  06/24/17 163 lb 12.8 oz (74.3 kg)  06/16/17 160 lb (72.6 kg)       ASSESSMENT AND PLAN:  Cardiomegaly - Plan: EKG 12-Lead, ECHOCARDIOGRAM COMPLETE Seen on chest x-ray Given her lower extremity edema and abnormal EKG we have ordered echocardiogram  Leg swelling - Plan: ECHOCARDIOGRAM COMPLETE Etiology unclear, component of pitting edema concerning for CHF though unable to exclude component of venous insufficiency/dependent edema Recommended echocardiogram to estimate right heart pressures, ejection fraction    Poorly controlled type 2 diabetes mellitus with neuropathy (HCC)  dietary guide provided  We have stressed importance of low carbohydrate diet   Chronic kidney disease, stage 4, severely decreased GFR (HCC)  kidney disease likely secondary to long-standing diabetes, hypertension  Already on ARB   Mixed hyperlipidemia  currently on a statin. This may need to be strengthened in follow-up visits to achieve goal LDL less than 70  Hypertension Possibly worsening leg swelling on amlodipine Discussed with her in detail, perhaps we could try alternatives We will start clonidine 0.1 mg twice a day Other options include isosorbide, hydralazine, Cardura   Total encounter time more than 45 minutes  Greater than 50%  was spent in counseling and coordination of care with the patient   Disposition:   F/U  1 month   No orders of the defined types were placed in this encounter.    Signed, Esmond Plants, M.D., Ph.D. 07/16/2017  [place], [place]

## 2017-07-17 ENCOUNTER — Ambulatory Visit: Payer: No Typology Code available for payment source | Admitting: Cardiovascular Disease

## 2017-07-20 ENCOUNTER — Other Ambulatory Visit: Payer: Self-pay | Admitting: Unknown Physician Specialty

## 2017-07-27 DIAGNOSIS — E113493 Type 2 diabetes mellitus with severe nonproliferative diabetic retinopathy without macular edema, bilateral: Secondary | ICD-10-CM | POA: Diagnosis not present

## 2017-07-27 DIAGNOSIS — H35033 Hypertensive retinopathy, bilateral: Secondary | ICD-10-CM | POA: Diagnosis not present

## 2017-07-28 ENCOUNTER — Telehealth: Payer: Self-pay

## 2017-07-28 ENCOUNTER — Telehealth: Payer: Self-pay | Admitting: Unknown Physician Specialty

## 2017-07-28 MED ORDER — OLMESARTAN MEDOXOMIL 40 MG PO TABS: 40.0000 mg | ORAL_TABLET | Freq: Every day | ORAL | 1 refills | Status: DC

## 2017-07-28 NOTE — Telephone Encounter (Signed)
Received a fax from [place] regarding the valsartan recall. Can we please send in a different prescription for the patient?

## 2017-07-28 NOTE — Telephone Encounter (Signed)
Patient called due to receiving a message about it being time for a colonoscopy. Patient was informed in message to call [facility] office. Patient has color guard at home but has not used it and is unsure of what she would be coming in for if appointment is made at office..   Please Advise.  Thank you

## 2017-07-28 NOTE — Telephone Encounter (Signed)
Tonya [name], the patient does not need to come in for anything correct? She should just complete her cologuard correct?

## 2017-07-28 NOTE — Telephone Encounter (Signed)
Yes

## 2017-07-29 NOTE — Telephone Encounter (Signed)
Called and let the patient know that she did not have to see [place] for anything and that she should just complete her cologuard. Patient verbalized understanding and I asked for her to give [place] a call if she had any questions.

## 2017-07-29 NOTE — Telephone Encounter (Signed)
Called and let the patient know about the medication change.

## 2017-08-13 ENCOUNTER — Other Ambulatory Visit: Payer: Self-pay | Admitting: Unknown Physician Specialty

## 2017-08-15 DIAGNOSIS — I639 Cerebral infarction, unspecified: Secondary | ICD-10-CM

## 2017-08-15 HISTORY — DX: Cerebral infarction, unspecified: I63.9

## 2017-08-20 ENCOUNTER — Ambulatory Visit: Payer: No Typology Code available for payment source | Admitting: Cardiovascular Disease

## 2017-09-03 ENCOUNTER — Ambulatory Visit (INDEPENDENT_AMBULATORY_CARE_PROVIDER_SITE_OTHER): Payer: Medicare Other | Admitting: Nurse Practitioner

## 2017-09-03 ENCOUNTER — Encounter: Payer: Self-pay | Admitting: Nurse Practitioner

## 2017-09-03 VITALS — BP 158/90 | HR 70 | Ht 67.0 in | Wt 160.2 lb

## 2017-09-03 DIAGNOSIS — E782 Mixed hyperlipidemia: Secondary | ICD-10-CM

## 2017-09-03 DIAGNOSIS — I129 Hypertensive chronic kidney disease with stage 1 through stage 4 chronic kidney disease, or unspecified chronic kidney disease: Secondary | ICD-10-CM | POA: Diagnosis not present

## 2017-09-03 DIAGNOSIS — N183 Chronic kidney disease, stage 3 (moderate): Secondary | ICD-10-CM | POA: Diagnosis not present

## 2017-09-03 DIAGNOSIS — Z794 Long term (current) use of insulin: Secondary | ICD-10-CM | POA: Diagnosis not present

## 2017-09-03 DIAGNOSIS — I10 Essential (primary) hypertension: Secondary | ICD-10-CM

## 2017-09-03 DIAGNOSIS — E1122 Type 2 diabetes mellitus with diabetic chronic kidney disease: Secondary | ICD-10-CM

## 2017-09-03 MED ORDER — CLONIDINE HCL 0.2 MG PO TABS: 0.2000 mg | ORAL_TABLET | Freq: Two times a day (BID) | ORAL | 5 refills | Status: DC

## 2017-09-03 MED ORDER — ROSUVASTATIN CALCIUM 5 MG PO TABS: 5.0000 mg | ORAL_TABLET | Freq: Every day | ORAL | 3 refills | Status: DC

## 2017-09-03 NOTE — Patient Instructions (Addendum)
Medication Instructions:  Your physician has recommended you make the following change in your medication:  STOP taking atorvastatin One week after stopping atorvastatin, start taking rosuvastatin 5mg  once daily INCREASE clonidine to 0.2mg  twice a day   Labwork: none  Testing/Procedures: none  Follow-Up: Your physician wants you to follow-up in: 6 months with Dr. Cathie Hoops will receive a reminder letter in the mail two months in advance. If you don't receive a letter, please call our office to schedule the follow-up appointment.   Any Other Special Instructions Will Be Listed Below (If Applicable). Please check your blood pressure daily and call our office in one week with the daily readings.      If you need a refill on your cardiac medications before your next appointment, please call your pharmacy.  Rosuvastatin Tablets What is this medicine? ROSUVASTATIN (roe SOO va sta tin) is known as a HMG-CoA reductase inhibitor or 'statin'. It lowers cholesterol and triglycerides in the blood. This drug may also reduce the risk of heart attack, stroke, or other health problems in patients with risk factors for heart disease. Diet and lifestyle changes are often used with this drug. This medicine may be used for other purposes; ask your health care provider or pharmacist if you have questions. COMMON BRAND NAME(S): Crestor What should I tell my health care provider before I take this medicine? They need to know if you have any of these conditions: -frequently drink alcoholic beverages -kidney disease -liver disease -muscle aches or weakness -other medical condition -an unusual or allergic reaction to rosuvastatin, other medicines, foods, dyes, or preservatives -pregnant or trying to get pregnant -breast-feeding How should I use this medicine? Take this medicine by mouth with a glass of water. Follow the directions on the prescription label. Do not cut, crush or chew this medicine.  You can take this medicine with or without food. Take your doses at regular intervals. Do not take your medicine more often than directed. Talk to your pediatrician regarding the use of this medicine in children. While this drug may be prescribed for children as young as 55 years old for selected conditions, precautions do apply. Overdosage: If you think you have taken too much of this medicine contact a poison control center or emergency room at once. NOTE: This medicine is only for you. Do not share this medicine with others. What if I miss a dose? If you miss a dose, take it as soon as you can. Do not take 2 doses within 12 hours of each other. If there are less than 12 hours until your next dose, take only that dose. Do not take double or extra doses. What may interact with this medicine? Do not take this medicine with any of the following medications: -herbal medicines like red yeast rice This medicine may also interact with the following medications: -alcohol -antacids containing aluminum hydroxide or magnesium hydroxide -cyclosporine -other medicines for high cholesterol -some medicines for HIV infection -warfarin This list may not describe all possible interactions. Give your health care provider a list of all the medicines, herbs, non-prescription drugs, or dietary supplements you use. Also tell them if you smoke, drink alcohol, or use illegal drugs. Some items may interact with your medicine. What should I watch for while using this medicine? Visit your doctor or health care professional for regular check-ups. You may need regular tests to make sure your liver is working properly. Tell your doctor or health care professional right away if you get any  unexplained muscle pain, tenderness, or weakness, especially if you also have a fever and tiredness. Your doctor or health care professional may tell you to stop taking this medicine if you develop muscle problems. If your muscle problems do  not go away after stopping this medicine, contact your health care professional. This medicine may affect blood sugar levels. If you have diabetes, check with your doctor or health care professional before you change your diet or the dose of your diabetic medicine. Avoid taking antacids containing aluminum, calcium or magnesium within 2 hours of taking this medicine. This drug is only part of a total heart-health program. Your doctor or a dietician can suggest a low-cholesterol and low-fat diet to help. Avoid alcohol and smoking, and keep a proper exercise schedule. Do not use this drug if you are pregnant or breast-feeding. Serious side effects to an unborn child or to an infant are possible. Talk to your doctor or pharmacist for more information. What side effects may I notice from receiving this medicine? Side effects that you should report to your doctor or health care professional as soon as possible: -allergic reactions like skin rash, itching or hives, swelling of the face, lips, or tongue -dark urine -fever -joint pain -muscle cramps, pain -redness, blistering, peeling or loosening of the skin, including inside the mouth -trouble passing urine or change in the amount of urine -unusually weak or tired -yellowing of the eyes or skin Side effects that usually do not require medical attention (report to your doctor or health care professional if they continue or are bothersome): -constipation -heartburn -nausea -stomach gas, pain, upset This list may not describe all possible side effects. Call your doctor for medical advice about side effects. You may report side effects to FDA at 1-800-FDA-1088. Where should I keep my medicine? Keep out of the reach of children. Store at room temperature between 20 and 25 degrees C (68 and 77 degrees F). Keep container tightly closed (protect from moisture). Throw away any unused medicine after the expiration date. NOTE: This sheet is a summary. It may  not cover all possible information. If you have questions about this medicine, talk to your doctor, pharmacist, or health care provider.  2018 Elsevier/Gold Standard (2015-05-17 13:33:08)

## 2017-09-03 NOTE — Progress Notes (Signed)
Office Visit    Patient Name: Tonya [name] Date of Encounter: 09/03/2017  Primary Care Provider:  Kathrine Haddock, NP Primary Cardiologist:  Johnny Bridge, MD   Chief Complaint    71 year old female with a history of diabetes, stage 3-4 chronic kidney disease, diastolic dysfunction, hypertension, and hyperlipidemia, who presents for follow-up.  Past Medical History    Past Medical History:  Diagnosis Date  . CKD (chronic kidney disease), stage III   . Diabetes mellitus without complication ([place])   . Diastolic dysfunction    a. 05/2017 Echo: EF 55-60%, Gr1 DD, mildly dil LA.  [name] Hyperlipidemia   . Hypertension    Past Surgical History:  Procedure Laterality Date  . COLONOSCOPY  12/15/2006  . TUBAL LIGATION      Allergies  Allergies  Allergen Reactions  . Atorvastatin     Myalgias   . Pravastatin     Myalgias   . Trulicity [Dulaglutide] Hives    History of Present Illness    71 year old female with a history of diabetes, stage 3-4 chronic kidney disease, hypertension, hyperlipidemia, and diastolic dysfunction, who presents for follow-up. She was last seen in clinic in May of this year, when she was evaluated as a new patient for history of hypertension and cardiomegaly. Clonidine was added that day in the setting of elevated pressures. An echo was performed which showed normal LV function and grade 1 diastolic dysfunction. Over the past few months, she has done reasonably well. Blood pressures tend to run high and are 158/90 today. She denies chest pain, palpitations, PND, dyspnea, orthopnea, dizziness, syncope, edema, or early satiety. She is on atorvastatin therapy and has recently noted that she is having myalgias. She did not realize that atorvastatin is the generic for Lipitor, which caused myalgias in the past.  Home Medications    Prior to Admission medications   Medication Sig Start Date End Date Taking? Authorizing Provider  amLODipine (NORVASC) 5 MG tablet  Take 1 tablet (5 mg total) by mouth daily as needed. 05/04/17  Yes [name], MD  aspirin 81 MG tablet Take 81 mg by mouth daily.   Yes [provider]  furosemide (LASIX) 40 MG tablet Take 1 tablet (40 mg total) by mouth daily. 03/25/17  Yes Kathrine Haddock, NP  Insulin Detemir (LEVEMIR FLEXPEN) 100 UNIT/ML Pen Inject 15-20 Units into the skin Nightly. 12/12/16 12/12/17 Yes [provider]  Insulin Pen Needle (NOVOFINE) 30G X 8 MM MISC Inject 10 each into the skin 3 (three) times daily. E11.40 11/21/15  Yes Kathrine Haddock, NP  lidocaine (XYLOCAINE) 5 % ointment APPLY EXTERNALLY TO THE AFFECTED AREA AS NEEDED 07/03/16  Yes Kathrine Haddock, NP  metFORMIN (GLUCOPHAGE-XR) 500 MG 24 hr tablet Take 1,000 mg by mouth daily. 03/20/17 03/20/18 Yes [provider]  metoprolol succinate (TOPROL-XL) 50 MG 24 hr tablet TAKE 1 TABLET BY MOUTH EVERY DAY. 07/03/16  Yes Kathrine Haddock, NP  NOVOFINE 32G X 6 MM MISC  06/16/17  Yes [provider]  olmesartan (BENICAR) 40 MG tablet Take 1 tablet (40 mg total) by mouth daily. 07/28/17  Yes Kathrine Haddock, NP  omeprazole (PRILOSEC) 20 MG capsule TAKE 1 CAPSULE(20 MG) BY MOUTH TWICE DAILY 06/15/17  Yes Johnson, Megan P, DO  ONE TOUCH ULTRA TEST test strip USE TO TEST BLOOD SUGAR THREE TIMES DAILY 07/21/17  Yes Kathrine Haddock, NP  cloNIDine (CATAPRES) 0.2 MG tablet Take 1 tablet (0.2 mg total) by mouth 2 (two) times daily.  09/03/17   Rogelia Mire, NP  rosuvastatin (CRESTOR) 5 MG tablet Take 1 tablet (5 mg total) by mouth daily. 09/03/17 12/02/17  Rogelia Mire, NP    Review of Systems    Myalgias affecting her thighs, which she attributes to Lipitor therapy. She denies chest pain, palpitations, dyspnea, PND, orthopnea, dizziness, syncope, edema, or early satiety.  All other systems reviewed and are otherwise negative except as noted above.  Physical Exam    VS:  BP (!) 158/90 (BP Location: Left Arm, Patient Position: Sitting,  Cuff Size: Normal)   Pulse 70   Ht 5\' 7"  (1.702 m)   Wt 160 lb 4 oz (72.7 kg)   LMP  (LMP Unknown)   BMI 25.10 kg/m  , BMI Body mass index is 25.1 kg/m. GEN: Well nourished, well developed, in no acute distress.  HEENT: normal.  Neck: Supple, no JVD, carotid bruits, or masses. Cardiac: RRR, no murmurs, rubs, or gallops. No clubbing, cyanosis, edema.  Radials/DP/PT 2+ and equal bilaterally.  Respiratory:  Respirations regular and unlabored, clear to auscultation bilaterally. GI: Soft, nontender, nondistended, BS + x 4. MS: no deformity or atrophy. Skin: warm and dry, no rash. Neuro:  Strength and sensation are intact. Psych: Normal affect.  Accessory Clinical Findings    Echocardiogram from June 2018 was reviewed with patient and details added to the past medical history above.  Assessment & Plan    1.  Essential hypertension: Blood pressure continues to run high at home and is 150/90 today. I'm going to increase her clonidine to 0.2 mg twice a day. She otherwise remains on metoprolol, Benicar, and amlodipine. We've been reluctant to titrate amlodipine secondary to concerns for development of possible lower extremity swelling.  2. Hyperlipidemia: Patient is currently on atorvastatin. She has been experiencing thigh aching and she had similar symptoms when on Lipitor in the past. She did not realize that atorvastatin was the generic for Lipitor. I asked her to take a Lipitor holiday over the next week and then I will add rosuvastatin 5 mg daily to start in 1 week.    3. Type 2 diabetes mellitus: This is followed closely by primary care and she does use insulin. Changing statin as outlined above in the setting of myalgias. Continue ARB.  4. Stage 3-4 chronic kidney disease: Followed by nephrology. She remains on ARB therapy.  5. Disposition: Patient will check blood pressures daily over the next week and contact us within the next week with results so that we can consider further  adjustments of medications if necessary. Otherwise, plan to follow-up in 6 months or sooner if necessary.  Murray Hodgkins, NP 09/03/2017, 5:23 PM

## 2017-09-05 DIAGNOSIS — R4781 Slurred speech: Secondary | ICD-10-CM | POA: Diagnosis not present

## 2017-09-05 DIAGNOSIS — E785 Hyperlipidemia, unspecified: Secondary | ICD-10-CM | POA: Diagnosis not present

## 2017-09-05 DIAGNOSIS — I7090 Unspecified atherosclerosis: Secondary | ICD-10-CM | POA: Diagnosis not present

## 2017-09-05 DIAGNOSIS — Z4682 Encounter for fitting and adjustment of non-vascular catheter: Secondary | ICD-10-CM | POA: Diagnosis not present

## 2017-09-05 DIAGNOSIS — E119 Type 2 diabetes mellitus without complications: Secondary | ICD-10-CM | POA: Diagnosis not present

## 2017-09-05 DIAGNOSIS — R4701 Aphasia: Secondary | ICD-10-CM | POA: Diagnosis not present

## 2017-09-05 DIAGNOSIS — G8194 Hemiplegia, unspecified affecting left nondominant side: Secondary | ICD-10-CM | POA: Diagnosis not present

## 2017-09-05 DIAGNOSIS — N179 Acute kidney failure, unspecified: Secondary | ICD-10-CM | POA: Diagnosis present

## 2017-09-05 DIAGNOSIS — E7800 Pure hypercholesterolemia, unspecified: Secondary | ICD-10-CM | POA: Diagnosis present

## 2017-09-05 DIAGNOSIS — I639 Cerebral infarction, unspecified: Secondary | ICD-10-CM | POA: Diagnosis not present

## 2017-09-05 DIAGNOSIS — I10 Essential (primary) hypertension: Secondary | ICD-10-CM | POA: Diagnosis not present

## 2017-09-05 DIAGNOSIS — I6523 Occlusion and stenosis of bilateral carotid arteries: Secondary | ICD-10-CM | POA: Diagnosis not present

## 2017-09-05 DIAGNOSIS — R471 Dysarthria and anarthria: Secondary | ICD-10-CM | POA: Diagnosis present

## 2017-09-05 DIAGNOSIS — B0229 Other postherpetic nervous system involvement: Secondary | ICD-10-CM | POA: Diagnosis not present

## 2017-09-05 DIAGNOSIS — R29810 Facial weakness: Secondary | ICD-10-CM | POA: Diagnosis not present

## 2017-09-05 DIAGNOSIS — R29706 NIHSS score 6: Secondary | ICD-10-CM | POA: Diagnosis present

## 2017-09-07 ENCOUNTER — Telehealth: Payer: Self-pay | Admitting: Unknown Physician Specialty

## 2017-09-07 NOTE — Telephone Encounter (Signed)
Patients daughter wanted to let Malachy Mood know patient is in [place] with a mild stroke.  I scheduled her for hospital fu on 10/5 @1   Thanks

## 2017-09-07 NOTE — Telephone Encounter (Signed)
Routing to provider, FYI.  

## 2017-09-09 ENCOUNTER — Other Ambulatory Visit: Payer: Self-pay | Admitting: Family Medicine

## 2017-09-09 NOTE — Telephone Encounter (Signed)
Your patient 

## 2017-09-10 DIAGNOSIS — E113393 Type 2 diabetes mellitus with moderate nonproliferative diabetic retinopathy without macular edema, bilateral: Secondary | ICD-10-CM | POA: Diagnosis not present

## 2017-09-10 DIAGNOSIS — Z9289 Personal history of other medical treatment: Secondary | ICD-10-CM | POA: Diagnosis not present

## 2017-09-10 DIAGNOSIS — E1165 Type 2 diabetes mellitus with hyperglycemia: Secondary | ICD-10-CM | POA: Diagnosis not present

## 2017-09-10 DIAGNOSIS — E1122 Type 2 diabetes mellitus with diabetic chronic kidney disease: Secondary | ICD-10-CM | POA: Diagnosis not present

## 2017-09-10 DIAGNOSIS — N183 Chronic kidney disease, stage 3 (moderate): Secondary | ICD-10-CM | POA: Diagnosis not present

## 2017-09-10 DIAGNOSIS — E1121 Type 2 diabetes mellitus with diabetic nephropathy: Secondary | ICD-10-CM | POA: Diagnosis not present

## 2017-09-10 DIAGNOSIS — Z794 Long term (current) use of insulin: Secondary | ICD-10-CM | POA: Diagnosis not present

## 2017-09-10 DIAGNOSIS — Z79899 Other long term (current) drug therapy: Secondary | ICD-10-CM | POA: Diagnosis not present

## 2017-09-10 DIAGNOSIS — I10 Essential (primary) hypertension: Secondary | ICD-10-CM | POA: Diagnosis not present

## 2017-09-12 DIAGNOSIS — I69392 Facial weakness following cerebral infarction: Secondary | ICD-10-CM | POA: Diagnosis not present

## 2017-09-12 DIAGNOSIS — B0229 Other postherpetic nervous system involvement: Secondary | ICD-10-CM | POA: Diagnosis not present

## 2017-09-12 DIAGNOSIS — I129 Hypertensive chronic kidney disease with stage 1 through stage 4 chronic kidney disease, or unspecified chronic kidney disease: Secondary | ICD-10-CM | POA: Diagnosis not present

## 2017-09-12 DIAGNOSIS — Z7982 Long term (current) use of aspirin: Secondary | ICD-10-CM | POA: Diagnosis not present

## 2017-09-12 DIAGNOSIS — N189 Chronic kidney disease, unspecified: Secondary | ICD-10-CM | POA: Diagnosis not present

## 2017-09-12 DIAGNOSIS — E1122 Type 2 diabetes mellitus with diabetic chronic kidney disease: Secondary | ICD-10-CM | POA: Diagnosis not present

## 2017-09-12 DIAGNOSIS — Z7902 Long term (current) use of antithrombotics/antiplatelets: Secondary | ICD-10-CM | POA: Diagnosis not present

## 2017-09-12 DIAGNOSIS — I69322 Dysarthria following cerebral infarction: Secondary | ICD-10-CM | POA: Diagnosis not present

## 2017-09-12 DIAGNOSIS — Z7984 Long term (current) use of oral hypoglycemic drugs: Secondary | ICD-10-CM | POA: Diagnosis not present

## 2017-09-12 DIAGNOSIS — E785 Hyperlipidemia, unspecified: Secondary | ICD-10-CM | POA: Diagnosis not present

## 2017-09-12 DIAGNOSIS — I69354 Hemiplegia and hemiparesis following cerebral infarction affecting left non-dominant side: Secondary | ICD-10-CM | POA: Diagnosis not present

## 2017-09-14 ENCOUNTER — Telehealth: Payer: Self-pay | Admitting: Unknown Physician Specialty

## 2017-09-14 DIAGNOSIS — I69392 Facial weakness following cerebral infarction: Secondary | ICD-10-CM | POA: Diagnosis not present

## 2017-09-14 DIAGNOSIS — I129 Hypertensive chronic kidney disease with stage 1 through stage 4 chronic kidney disease, or unspecified chronic kidney disease: Secondary | ICD-10-CM | POA: Diagnosis not present

## 2017-09-14 DIAGNOSIS — I69354 Hemiplegia and hemiparesis following cerebral infarction affecting left non-dominant side: Secondary | ICD-10-CM | POA: Diagnosis not present

## 2017-09-14 DIAGNOSIS — N189 Chronic kidney disease, unspecified: Secondary | ICD-10-CM | POA: Diagnosis not present

## 2017-09-14 DIAGNOSIS — E1122 Type 2 diabetes mellitus with diabetic chronic kidney disease: Secondary | ICD-10-CM | POA: Diagnosis not present

## 2017-09-14 DIAGNOSIS — I69322 Dysarthria following cerebral infarction: Secondary | ICD-10-CM | POA: Diagnosis not present

## 2017-09-14 NOTE — Telephone Encounter (Signed)
Routing to provider  

## 2017-09-14 NOTE — Telephone Encounter (Signed)
Colletta Maryland, Warden/ranger, calling to get verbal orders to give patient OT in her home two times a week for three weeks.   Please Advise.  Thank you

## 2017-09-14 NOTE — Telephone Encounter (Signed)
OK for OT

## 2017-09-15 ENCOUNTER — Telehealth: Payer: Self-pay | Admitting: Unknown Physician Specialty

## 2017-09-15 NOTE — Telephone Encounter (Addendum)
Monica from [facility] home[facility] called requesting a verbal order for patient on the following  Speech, voice and swallowing  1st week 2 xs week 2nd week 4 xs week  Thank [employer] [phone]

## 2017-09-15 NOTE — Telephone Encounter (Signed)
Approval given

## 2017-09-15 NOTE — Telephone Encounter (Signed)
Routing to provider  

## 2017-09-15 NOTE — Telephone Encounter (Signed)
Called and let Tonya [name] know that [place] gave the OK for verbal orders.

## 2017-09-15 NOTE — Telephone Encounter (Signed)
Called and let Colletta Maryland know that [place] gave the verbal OK for orders.

## 2017-09-15 NOTE — Telephone Encounter (Signed)
Called and left [place] a VM asking for her to please return my call.

## 2017-09-16 DIAGNOSIS — I69392 Facial weakness following cerebral infarction: Secondary | ICD-10-CM | POA: Diagnosis not present

## 2017-09-16 DIAGNOSIS — N189 Chronic kidney disease, unspecified: Secondary | ICD-10-CM | POA: Diagnosis not present

## 2017-09-16 DIAGNOSIS — I69354 Hemiplegia and hemiparesis following cerebral infarction affecting left non-dominant side: Secondary | ICD-10-CM | POA: Diagnosis not present

## 2017-09-16 DIAGNOSIS — E1122 Type 2 diabetes mellitus with diabetic chronic kidney disease: Secondary | ICD-10-CM | POA: Diagnosis not present

## 2017-09-16 DIAGNOSIS — I69322 Dysarthria following cerebral infarction: Secondary | ICD-10-CM | POA: Diagnosis not present

## 2017-09-16 DIAGNOSIS — I129 Hypertensive chronic kidney disease with stage 1 through stage 4 chronic kidney disease, or unspecified chronic kidney disease: Secondary | ICD-10-CM | POA: Diagnosis not present

## 2017-09-17 DIAGNOSIS — N189 Chronic kidney disease, unspecified: Secondary | ICD-10-CM | POA: Diagnosis not present

## 2017-09-17 DIAGNOSIS — I69392 Facial weakness following cerebral infarction: Secondary | ICD-10-CM | POA: Diagnosis not present

## 2017-09-17 DIAGNOSIS — I69322 Dysarthria following cerebral infarction: Secondary | ICD-10-CM | POA: Diagnosis not present

## 2017-09-17 DIAGNOSIS — I129 Hypertensive chronic kidney disease with stage 1 through stage 4 chronic kidney disease, or unspecified chronic kidney disease: Secondary | ICD-10-CM | POA: Diagnosis not present

## 2017-09-17 DIAGNOSIS — I69354 Hemiplegia and hemiparesis following cerebral infarction affecting left non-dominant side: Secondary | ICD-10-CM | POA: Diagnosis not present

## 2017-09-17 DIAGNOSIS — E1122 Type 2 diabetes mellitus with diabetic chronic kidney disease: Secondary | ICD-10-CM | POA: Diagnosis not present

## 2017-09-18 ENCOUNTER — Inpatient Hospital Stay: Payer: Medicare Other | Admitting: Unknown Physician Specialty

## 2017-09-18 DIAGNOSIS — I129 Hypertensive chronic kidney disease with stage 1 through stage 4 chronic kidney disease, or unspecified chronic kidney disease: Secondary | ICD-10-CM | POA: Diagnosis not present

## 2017-09-18 DIAGNOSIS — I69322 Dysarthria following cerebral infarction: Secondary | ICD-10-CM | POA: Diagnosis not present

## 2017-09-18 DIAGNOSIS — I69392 Facial weakness following cerebral infarction: Secondary | ICD-10-CM | POA: Diagnosis not present

## 2017-09-18 DIAGNOSIS — E1122 Type 2 diabetes mellitus with diabetic chronic kidney disease: Secondary | ICD-10-CM | POA: Diagnosis not present

## 2017-09-18 DIAGNOSIS — N189 Chronic kidney disease, unspecified: Secondary | ICD-10-CM | POA: Diagnosis not present

## 2017-09-18 DIAGNOSIS — I69354 Hemiplegia and hemiparesis following cerebral infarction affecting left non-dominant side: Secondary | ICD-10-CM | POA: Diagnosis not present

## 2017-09-21 ENCOUNTER — Ambulatory Visit (INDEPENDENT_AMBULATORY_CARE_PROVIDER_SITE_OTHER): Payer: Medicare Other | Admitting: Unknown Physician Specialty

## 2017-09-21 ENCOUNTER — Encounter: Payer: Self-pay | Admitting: Unknown Physician Specialty

## 2017-09-21 ENCOUNTER — Other Ambulatory Visit: Payer: Self-pay | Admitting: Unknown Physician Specialty

## 2017-09-21 VITALS — BP 131/77 | HR 82 | Temp 98.6°F | Wt 155.6 lb

## 2017-09-21 DIAGNOSIS — N184 Chronic kidney disease, stage 4 (severe): Secondary | ICD-10-CM | POA: Diagnosis not present

## 2017-09-21 DIAGNOSIS — Z09 Encounter for follow-up examination after completed treatment for conditions other than malignant neoplasm: Secondary | ICD-10-CM | POA: Diagnosis not present

## 2017-09-21 DIAGNOSIS — E1140 Type 2 diabetes mellitus with diabetic neuropathy, unspecified: Secondary | ICD-10-CM

## 2017-09-21 DIAGNOSIS — I129 Hypertensive chronic kidney disease with stage 1 through stage 4 chronic kidney disease, or unspecified chronic kidney disease: Secondary | ICD-10-CM | POA: Diagnosis not present

## 2017-09-21 DIAGNOSIS — M25562 Pain in left knee: Secondary | ICD-10-CM | POA: Diagnosis not present

## 2017-09-21 DIAGNOSIS — G8929 Other chronic pain: Secondary | ICD-10-CM | POA: Insufficient documentation

## 2017-09-21 DIAGNOSIS — E1165 Type 2 diabetes mellitus with hyperglycemia: Secondary | ICD-10-CM | POA: Diagnosis not present

## 2017-09-21 NOTE — Assessment & Plan Note (Signed)
Left knee pain.  Consider cortisone shot.  Get knee x-ray

## 2017-09-21 NOTE — Progress Notes (Signed)
BP 131/77   Pulse 82   Temp 98.6 F (37 C)   Wt 155 lb 9.6 oz (70.6 kg)   LMP  (LMP Unknown)   SpO2 97%   BMI 24.37 kg/m    Subjective:    Patient ID: Tonya [name], female    DOB: [birth year], 71 y.o.   MRN: [id number]  HPI: Tonya [name] is a 71 y.o. female  Chief Complaint  Patient presents with  . Hospitalization Follow-up    pt states she was at [facility] for a small stroke    Pt is here with her husband and daughter Pt was at [facility] due to a small stroke.  She is having trouble with her voice and her eyes.  She is getting close f/u with Dr. [name] for her Diabetes. Her BS is good in the AM.  Taking 20 units of long acting insulin.  Reviewed Dr. Joycie Peek note and much of that time was spent reconciling medications which is appreciated.  Duplicate medications were discontinued.  She is now monitored closely by her family.    Hypertension Using medications without difficulty Average home BPs Monitored by husband and are below 140/80   No problems or lightheadedness No chest pain with exertion or shortness of breath No Edema  Hyperlipidemia Using medications without problems: No Muscle aches  Diet compliance: Husband is cooking for her.  Family is keeping her away from fast food.  Exercise: working with physical therapy.    Relevant past medical, surgical, family and social history reviewed and updated as indicated. Interim medical history since our last visit reviewed. Allergies and medications reviewed and updated.  Review of Systems  Per HPI unless specifically indicated above     Objective:    BP 131/77   Pulse 82   Temp 98.6 F (37 C)   Wt 155 lb 9.6 oz (70.6 kg)   LMP  (LMP Unknown)   SpO2 97%   BMI 24.37 kg/m   Wt Readings from Last 3 Encounters:  09/21/17 155 lb 9.6 oz (70.6 kg)  09/03/17 160 lb 4 oz (72.7 kg)  07/14/17 160 lb 9.6 oz (72.8 kg)    Physical Exam  Constitutional: She is oriented to person, place, and time. She appears well-developed and  well-nourished. No distress.  HENT:  Head: Normocephalic and atraumatic.  Eyes: Conjunctivae and lids are normal. Right eye exhibits no discharge. Left eye exhibits no discharge. No scleral icterus.  Neck: Normal range of motion. Neck supple. No JVD present. Carotid bruit is not present.  Cardiovascular: Normal rate, regular rhythm and normal heart sounds.   Pulmonary/Chest: Effort normal and breath sounds normal.  Abdominal: Normal appearance. There is no splenomegaly or hepatomegaly.  Musculoskeletal: Normal range of motion.  Neurological: She is alert and oriented to person, place, and time.  Skin: Skin is warm, dry and intact. No rash noted. No pallor.  Psychiatric: She has a normal mood and affect. Her behavior is normal. Judgment and thought content normal.    Results for orders placed or performed in visit on 07/14/17  TSH  Result Value Ref Range   TSH 2.180 0.450 - 4.500 uIU/mL  CBC with Differential/Platelet  Result Value Ref Range   WBC 8.2 3.4 - 10.8 x10E3/uL   RBC 3.95 3.77 - 5.28 x10E6/uL   Hemoglobin 10.4 (L) 11.1 - 15.9 g/dL   Hematocrit 34.4 34.0 - 46.6 %   MCV 87 79 - 97 fL   MCH 26.3 (L) 26.6 - 33.0  pg   MCHC 30.2 (L) 31.5 - 35.7 g/dL   RDW 14.9 12.3 - 15.4 %   Platelets 307 150 - 379 x10E3/uL   Neutrophils 70 Not Estab. %   Lymphs 24 Not Estab. %   Monocytes 5 Not Estab. %   Eos 1 Not Estab. %   Basos 0 Not Estab. %   Neutrophils Absolute 5.7 1.4 - 7.0 x10E3/uL   Lymphocytes Absolute 2.0 0.7 - 3.1 x10E3/uL   Monocytes Absolute 0.4 0.1 - 0.9 x10E3/uL   EOS (ABSOLUTE) 0.1 0.0 - 0.4 x10E3/uL   Basophils Absolute 0.0 0.0 - 0.2 x10E3/uL   Immature Granulocytes 0 Not Estab. %   Immature Grans (Abs) 0.0 0.0 - 0.1 x10E3/uL      Assessment & Plan:   Problem List Items Addressed This Visit      Unprioritized   Chronic kidney disease, stage 4, severely decreased GFR ([facility])    Seeing Nephrology      Chronic pain of left knee - Primary    Left knee pain.   Consider cortisone shot.  Get knee x-ray      Relevant Orders   DG Knee Complete 4 Views Left   Hypertensive chronic kidney disease with stage 1 through stage 4 chronic kidney disease, or unspecified chronic kidney disease    This is good today.  Compliance is improved under the guidance of family      Poorly controlled type 2 diabetes mellitus with neuropathy ([place])    Per Dr. [name].  Note reviewed      Relevant Medications   Insulin Detemir (LEVEMIR FLEXPEN) 100 UNIT/ML Pen   pioglitazone (ACTOS) 15 MG tablet    Other Visit Diagnoses    Hospital discharge follow-up       Recovering well with home PT, OT and speech therapy.         Follow up plan: Return in about 2 weeks (around 10/05/2017).

## 2017-09-21 NOTE — Assessment & Plan Note (Signed)
Seeing Nephrology

## 2017-09-21 NOTE — Assessment & Plan Note (Signed)
Per Dr. [name].  Note reviewed

## 2017-09-21 NOTE — Assessment & Plan Note (Signed)
This is good today.  Compliance is improved under the guidance of family

## 2017-09-22 ENCOUNTER — Encounter (INDEPENDENT_AMBULATORY_CARE_PROVIDER_SITE_OTHER): Payer: Self-pay | Admitting: Vascular Surgery

## 2017-09-22 ENCOUNTER — Ambulatory Visit
Admission: RE | Admit: 2017-09-22 | Discharge: 2017-09-22 | Disposition: A | Discharge status: Home / Self Care | Payer: Medicare Other | Source: Ambulatory Visit | Attending: Unknown Physician Specialty | Admitting: Unknown Physician Specialty

## 2017-09-22 ENCOUNTER — Ambulatory Visit (INDEPENDENT_AMBULATORY_CARE_PROVIDER_SITE_OTHER): Payer: Medicare Other | Admitting: Vascular Surgery

## 2017-09-22 ENCOUNTER — Ambulatory Visit (INDEPENDENT_AMBULATORY_CARE_PROVIDER_SITE_OTHER): Payer: Medicare Other

## 2017-09-22 VITALS — BP 130/84 | HR 78 | Resp 17 | Ht 67.0 in | Wt 154.0 lb

## 2017-09-22 DIAGNOSIS — R600 Localized edema: Secondary | ICD-10-CM | POA: Diagnosis not present

## 2017-09-22 DIAGNOSIS — M25562 Pain in left knee: Secondary | ICD-10-CM

## 2017-09-22 DIAGNOSIS — E1165 Type 2 diabetes mellitus with hyperglycemia: Secondary | ICD-10-CM

## 2017-09-22 DIAGNOSIS — I129 Hypertensive chronic kidney disease with stage 1 through stage 4 chronic kidney disease, or unspecified chronic kidney disease: Secondary | ICD-10-CM | POA: Diagnosis not present

## 2017-09-22 DIAGNOSIS — M1712 Unilateral primary osteoarthritis, left knee: Secondary | ICD-10-CM | POA: Diagnosis not present

## 2017-09-22 DIAGNOSIS — I130 Hypertensive heart and chronic kidney disease with heart failure and stage 1 through stage 4 chronic kidney disease, or unspecified chronic kidney disease: Secondary | ICD-10-CM | POA: Insufficient documentation

## 2017-09-22 DIAGNOSIS — N189 Chronic kidney disease, unspecified: Secondary | ICD-10-CM | POA: Diagnosis not present

## 2017-09-22 DIAGNOSIS — M8580 Other specified disorders of bone density and structure, unspecified site: Secondary | ICD-10-CM | POA: Diagnosis not present

## 2017-09-22 DIAGNOSIS — I10 Essential (primary) hypertension: Secondary | ICD-10-CM

## 2017-09-22 DIAGNOSIS — I69322 Dysarthria following cerebral infarction: Secondary | ICD-10-CM | POA: Diagnosis not present

## 2017-09-22 DIAGNOSIS — I69354 Hemiplegia and hemiparesis following cerebral infarction affecting left non-dominant side: Secondary | ICD-10-CM | POA: Diagnosis not present

## 2017-09-22 DIAGNOSIS — I69392 Facial weakness following cerebral infarction: Secondary | ICD-10-CM | POA: Diagnosis not present

## 2017-09-22 DIAGNOSIS — E1122 Type 2 diabetes mellitus with diabetic chronic kidney disease: Secondary | ICD-10-CM | POA: Diagnosis not present

## 2017-09-22 DIAGNOSIS — G8929 Other chronic pain: Secondary | ICD-10-CM | POA: Diagnosis present

## 2017-09-22 DIAGNOSIS — M7989 Other specified soft tissue disorders: Secondary | ICD-10-CM | POA: Diagnosis not present

## 2017-09-22 DIAGNOSIS — I1310 Hypertensive heart and chronic kidney disease without heart failure, with stage 1 through stage 4 chronic kidney disease, or unspecified chronic kidney disease: Secondary | ICD-10-CM | POA: Insufficient documentation

## 2017-09-22 DIAGNOSIS — N184 Chronic kidney disease, stage 4 (severe): Secondary | ICD-10-CM | POA: Diagnosis not present

## 2017-09-22 DIAGNOSIS — E1140 Type 2 diabetes mellitus with diabetic neuropathy, unspecified: Secondary | ICD-10-CM

## 2017-09-22 DIAGNOSIS — M179 Osteoarthritis of knee, unspecified: Secondary | ICD-10-CM | POA: Diagnosis not present

## 2017-09-22 NOTE — Progress Notes (Signed)
Patient ID: Tonya [name], female   DOB: 03/03/1946, 71 y.o.   MRN: [id number]  Chief Complaint  Patient presents with  . Follow-up    57month ble ven reflux    HPI Tonya [name] is a 71 y.o. female.  Patient returns in follow up of their leg swelling.  Her symptoms have been about the same as they were at her initial visit. Stockings, elevation, and increasing activity really have not made much difference. No new ulceration or infection. No fevers or chills. She does think the addition of Lasix may have created a mild improvement. Her venous reflux study today demonstrates no evidence of deep venous thrombosis or superficial thrombophlebitis. The patient has only a short segment of venous reflux in the common femoral vein with no other significant reflux identified in either lower extremity.         Past Medical History:  Diagnosis Date  . Chronic kidney disease   . Diabetes mellitus without complication ([place])   . Hyperlipidemia   . Hypertension          Past Surgical History:  Procedure Laterality Date  . COLONOSCOPY  12/15/2006  . TUBAL LIGATION           Family History  Problem Relation Age of Onset  . Diabetes Mother   . Stroke Mother   . Hyperlipidemia Father   . Hypertension Father   . Diabetes Sister   . Diabetes Brother   . Gout Son   . Diabetes Brother   . Kidney disease Son   . Breast cancer Neg Hx     Social History     Social History  Substance Use Topics  . Smoking status: Never Smoker  . Smokeless tobacco: Never Used  . Alcohol use No  No IVDU      Allergies  Allergen Reactions  . Trulicity [Dulaglutide] Hives          Current Outpatient Prescriptions  Medication Sig Dispense Refill  . amitriptyline (ELAVIL) 25 MG tablet Take 1 tablet (25 mg total) by mouth at bedtime. 30 tablet 2  . amLODipine (NORVASC) 5 MG tablet Take 1 tablet (5 mg total) by mouth daily as needed. 30 tablet 3  . aspirin 81 MG tablet  Take 81 mg by mouth daily.    . cloNIDine (CATAPRES) 0.1 MG tablet Take 1 tablet (0.1 mg total) by mouth 2 (two) times daily. 60 tablet 11  . furosemide (LASIX) 40 MG tablet Take 1 tablet (40 mg total) by mouth daily. 30 tablet 0  . Insulin Detemir (LEVEMIR FLEXPEN) 100 UNIT/ML Pen Inject 15-20 Units into the skin Nightly.    . Insulin Pen Needle (NOVOFINE) 30G X 8 MM MISC Inject 10 each into the skin 3 (three) times daily. E11.40 100 each 12  . lidocaine (XYLOCAINE) 5 % ointment APPLY EXTERNALLY TO THE AFFECTED AREA AS NEEDED 30 g 0  . metFORMIN (GLUCOPHAGE-XR) 500 MG 24 hr tablet Take 1,000 mg by mouth daily.    . metoprolol succinate (TOPROL-XL) 50 MG 24 hr tablet TAKE 1 TABLET BY MOUTH EVERY DAY. 30 tablet 0  . omeprazole (PRILOSEC) 20 MG capsule TAKE 1 CAPSULE(20 MG) BY MOUTH TWICE DAILY 60 capsule 0  . ONE TOUCH ULTRA TEST test strip USE TO TEST BLOOD SUGAR THREE TIMES DAILY. 100 each 12  . pravastatin (PRAVACHOL) 40 MG tablet Take 1 tablet (40 mg total) by mouth daily. 30 tablet 5  . valsartan (DIOVAN) 320 MG tablet  TAKE 1 TABLET(320 MG) BY MOUTH DAILY 30 tablet 0   No current facility-administered medications for this visit.       REVIEW OF SYSTEMS (Negative unless checked)  Constitutional: [] Weight loss  [] Fever  [] Chills Cardiac: [] Chest pain   [] Chest pressure   [] Palpitations   [] Shortness of breath when laying flat   [] Shortness of breath at rest   [] Shortness of breath with exertion. Vascular:  [] Pain in legs with walking   [] Pain in legs at rest   [] Pain in legs when laying flat   [] Claudication   [] Pain in feet when walking  [] Pain in feet at rest  [] Pain in feet when laying flat   [] History of DVT   [] Phlebitis   [x] Swelling in legs   [x] Varicose veins   [] Non-healing ulcers Pulmonary:   [] Uses home oxygen   [] Productive cough   [] Hemoptysis   [] Wheeze  [] COPD   [] Asthma Neurologic:  [] Dizziness  [] Blackouts   [] Seizures   [] History of stroke   [] History of TIA   [] Aphasia   [] Temporary blindness   [] Dysphagia   [] Weakness or numbness in arms   [] Weakness or numbness in legs Musculoskeletal:  [x] Arthritis   [] Joint swelling   [] Joint pain   [] Low back pain Hematologic:  [] Easy bruising  [] Easy bleeding   [] Hypercoagulable state   [] Anemic  [] Hepatitis Gastrointestinal:  [] Blood in stool   [] Vomiting blood  [] Gastroesophageal reflux/heartburn   [] Abdominal pain Genitourinary:  [x] Chronic kidney disease   [] Difficult urination  [] Frequent urination  [] Burning with urination   [] Hematuria Skin:  [] Rashes   [] Ulcers   [] Wounds Psychological:  [] History of anxiety   []  History of major depression.     Physical Exam BP 130/84 (BP Location: Right Arm)   Pulse 78   Resp 17   Ht 5\' 7"  (1.702 m)   Wt 69.9 kg (154 lb)   LMP  (LMP Unknown)   BMI 24.12 kg/m  Gen:  WD/WN, NAD Head: [place]/AT, No temporalis wasting.  Ear/Nose/Throat: Hearing grossly intact, dentition Good Eyes: Sclera non-icteric. Conjunctiva clear Neck: Supple. Trachea midline Pulmonary:  Good air movement, no use of accessory muscles, respirations not labored.  Cardiac: RRR, No JVD Vascular: Varicosities scattered and measuring up to 2 mm in the right lower extremity        Varicosities scattered and measuring up to 2 mm in the left lower extremity Vessel Right Left  Radial Palpable Palpable                          PT Palpable Palpable  DP Palpable Palpable    Musculoskeletal: M/S 5/5 throughout.  Trace bilateral LE edema Neurologic: Sensation grossly intact in extremities.  Symmetrical.  Speech is fluent.  Psychiatric: Judgment intact, Mood & affect appropriate for pt's clinical situation. Dermatologic: No rashes or ulcers noted.  No cellulitis or open wounds.    Radiology No results found.  Labs Recent Results (from the past 2160 hour(s))  TSH     Status: None   Collection Time: 07/14/17 11:13 AM  Result Value Ref Range   TSH 2.180 0.450 - 4.500 uIU/mL  CBC with  Differential/Platelet     Status: Abnormal   Collection Time: 07/14/17 11:13 AM  Result Value Ref Range   WBC 8.2 3.4 - 10.8 x10E3/uL   RBC 3.95 3.77 - 5.28 x10E6/uL   Hemoglobin 10.4 (L) 11.1 - 15.9 g/dL   Hematocrit 34.4 34.0 - 46.6 %  MCV 87 79 - 97 fL   MCH 26.3 (L) 26.6 - 33.0 pg   MCHC 30.2 (L) 31.5 - 35.7 g/dL   RDW 14.9 12.3 - 15.4 %   Platelets 307 150 - 379 x10E3/uL   Neutrophils 70 Not Estab. %   Lymphs 24 Not Estab. %   Monocytes 5 Not Estab. %   Eos 1 Not Estab. %   Basos 0 Not Estab. %   Neutrophils Absolute 5.7 1.4 - 7.0 x10E3/uL   Lymphocytes Absolute 2.0 0.7 - 3.1 x10E3/uL   Monocytes Absolute 0.4 0.1 - 0.9 x10E3/uL   EOS (ABSOLUTE) 0.1 0.0 - 0.4 x10E3/uL   Basophils Absolute 0.0 0.0 - 0.2 x10E3/uL   Immature Granulocytes 0 Not Estab. %   Immature Grans (Abs) 0.0 0.0 - 0.1 x10E3/uL    Assessment/Plan: Essential hypertension, benign blood pressure control important in reducing the progression of atherosclerotic disease. On appropriate oral medications.   Poorly controlled type 2 diabetes mellitus with neuropathy blood glucose control important in reducing the progression of atherosclerotic disease. Also, involved in wound healing. On appropriate medications.   Chronic kidney disease, stage 4, severely decreased GFR This could certainly contribute to her lower extremity swelling  Swelling of limb Her venous reflux study today demonstrates no evidence of deep venous thrombosis or superficial thrombophlebitis. The patient has only a short segment of venous reflux in the common femoral vein with no other significant reflux identified in either lower extremity.  Given these findings, I think swelling is likely largely due to medical issues such as renal disease. No intervention would be of benefit. Compression stockings and elevation as tolerated. If her symptoms worsen in the future, consideration for lymphedema pump would be given. Otherwise, return on as  needed basis     Leotis Pain 09/22/2017, 2:40 PM

## 2017-09-22 NOTE — Assessment & Plan Note (Signed)
Her venous reflux study today demonstrates no evidence of deep venous thrombosis or superficial thrombophlebitis. The patient has only a short segment of venous reflux in the common femoral vein with no other significant reflux identified in either lower extremity.  Given these findings, I think swelling is likely largely due to medical issues such as renal disease. No intervention would be of benefit. Compression stockings and elevation as tolerated. If her symptoms worsen in the future, consideration for lymphedema pump would be given. Otherwise, return on as needed basis

## 2017-09-22 NOTE — Patient Instructions (Signed)
Edema Edema is when you have too much fluid in your body or under your skin. Edema may make your legs, feet, and ankles swell up. Swelling is also common in looser tissues, like around your eyes. This is a common condition. It gets more common as you get older. There are many possible causes of edema. Eating too much salt (sodium) and being on your feet or sitting for a long time can cause edema in your legs, feet, and ankles. Hot weather may make edema worse. Edema is usually painless. Your skin may look swollen or shiny. Follow these instructions at home:  Keep the swollen body part raised (elevated) above the level of your heart when you are sitting or lying down.  Do not sit still or stand for a long time.  Do not wear tight clothes. Do not wear garters on your upper legs.  Exercise your legs. This can help the swelling go down.  Wear elastic bandages or support stockings as told by your doctor.  Eat a low-salt (low-sodium) diet to reduce fluid as told by your doctor.  Depending on the cause of your swelling, you may need to limit how much fluid you drink (fluid restriction).  Take over-the-counter and prescription medicines only as told by your doctor. Contact a doctor if:  Treatment is not working.  You have heart, liver, or kidney disease and have symptoms of edema.  You have sudden and unexplained weight gain. Get help right away if:  You have shortness of breath or chest pain.  You cannot breathe when you lie down.  You have pain, redness, or warmth in the swollen areas.  You have heart, liver, or kidney disease and get edema all of a sudden.  You have a fever and your symptoms get worse all of a sudden. Summary  Edema is when you have too much fluid in your body or under your skin.  Edema may make your legs, feet, and ankles swell up. Swelling is also common in looser tissues, like around your eyes.  Raise (elevate) the swollen body part above the level of your  heart when you are sitting or lying down.  Follow your doctor's instructions about diet and how much fluid you can drink (fluid restriction). This information is not intended to replace advice given to you by your health care provider. Make sure you discuss any questions you have with your health care provider. Document Released: 05/19/2008 Document Revised: 12/19/2016 Document Reviewed: 12/19/2016 Elsevier Interactive Patient Education  2017 [employer]  

## 2017-09-23 DIAGNOSIS — I129 Hypertensive chronic kidney disease with stage 1 through stage 4 chronic kidney disease, or unspecified chronic kidney disease: Secondary | ICD-10-CM | POA: Diagnosis not present

## 2017-09-23 DIAGNOSIS — I69322 Dysarthria following cerebral infarction: Secondary | ICD-10-CM | POA: Diagnosis not present

## 2017-09-23 DIAGNOSIS — I69392 Facial weakness following cerebral infarction: Secondary | ICD-10-CM | POA: Diagnosis not present

## 2017-09-23 DIAGNOSIS — I69354 Hemiplegia and hemiparesis following cerebral infarction affecting left non-dominant side: Secondary | ICD-10-CM | POA: Diagnosis not present

## 2017-09-23 DIAGNOSIS — N189 Chronic kidney disease, unspecified: Secondary | ICD-10-CM | POA: Diagnosis not present

## 2017-09-23 DIAGNOSIS — E1122 Type 2 diabetes mellitus with diabetic chronic kidney disease: Secondary | ICD-10-CM | POA: Diagnosis not present

## 2017-09-24 DIAGNOSIS — I69322 Dysarthria following cerebral infarction: Secondary | ICD-10-CM | POA: Diagnosis not present

## 2017-09-24 DIAGNOSIS — N189 Chronic kidney disease, unspecified: Secondary | ICD-10-CM | POA: Diagnosis not present

## 2017-09-24 DIAGNOSIS — I69354 Hemiplegia and hemiparesis following cerebral infarction affecting left non-dominant side: Secondary | ICD-10-CM | POA: Diagnosis not present

## 2017-09-24 DIAGNOSIS — I69392 Facial weakness following cerebral infarction: Secondary | ICD-10-CM | POA: Diagnosis not present

## 2017-09-24 DIAGNOSIS — E1122 Type 2 diabetes mellitus with diabetic chronic kidney disease: Secondary | ICD-10-CM | POA: Diagnosis not present

## 2017-09-24 DIAGNOSIS — I129 Hypertensive chronic kidney disease with stage 1 through stage 4 chronic kidney disease, or unspecified chronic kidney disease: Secondary | ICD-10-CM | POA: Diagnosis not present

## 2017-09-25 DIAGNOSIS — E1122 Type 2 diabetes mellitus with diabetic chronic kidney disease: Secondary | ICD-10-CM | POA: Diagnosis not present

## 2017-09-25 DIAGNOSIS — I69322 Dysarthria following cerebral infarction: Secondary | ICD-10-CM | POA: Diagnosis not present

## 2017-09-25 DIAGNOSIS — I69354 Hemiplegia and hemiparesis following cerebral infarction affecting left non-dominant side: Secondary | ICD-10-CM | POA: Diagnosis not present

## 2017-09-25 DIAGNOSIS — N189 Chronic kidney disease, unspecified: Secondary | ICD-10-CM | POA: Diagnosis not present

## 2017-09-25 DIAGNOSIS — I69392 Facial weakness following cerebral infarction: Secondary | ICD-10-CM | POA: Diagnosis not present

## 2017-09-25 DIAGNOSIS — I129 Hypertensive chronic kidney disease with stage 1 through stage 4 chronic kidney disease, or unspecified chronic kidney disease: Secondary | ICD-10-CM | POA: Diagnosis not present

## 2017-09-26 DIAGNOSIS — E1122 Type 2 diabetes mellitus with diabetic chronic kidney disease: Secondary | ICD-10-CM | POA: Diagnosis not present

## 2017-09-26 DIAGNOSIS — I129 Hypertensive chronic kidney disease with stage 1 through stage 4 chronic kidney disease, or unspecified chronic kidney disease: Secondary | ICD-10-CM | POA: Diagnosis not present

## 2017-09-26 DIAGNOSIS — I69354 Hemiplegia and hemiparesis following cerebral infarction affecting left non-dominant side: Secondary | ICD-10-CM | POA: Diagnosis not present

## 2017-09-26 DIAGNOSIS — I69322 Dysarthria following cerebral infarction: Secondary | ICD-10-CM | POA: Diagnosis not present

## 2017-09-26 DIAGNOSIS — N189 Chronic kidney disease, unspecified: Secondary | ICD-10-CM | POA: Diagnosis not present

## 2017-09-26 DIAGNOSIS — I69392 Facial weakness following cerebral infarction: Secondary | ICD-10-CM | POA: Diagnosis not present

## 2017-09-28 DIAGNOSIS — N189 Chronic kidney disease, unspecified: Secondary | ICD-10-CM | POA: Diagnosis not present

## 2017-09-28 DIAGNOSIS — I129 Hypertensive chronic kidney disease with stage 1 through stage 4 chronic kidney disease, or unspecified chronic kidney disease: Secondary | ICD-10-CM | POA: Diagnosis not present

## 2017-09-28 DIAGNOSIS — I69354 Hemiplegia and hemiparesis following cerebral infarction affecting left non-dominant side: Secondary | ICD-10-CM | POA: Diagnosis not present

## 2017-09-28 DIAGNOSIS — I69392 Facial weakness following cerebral infarction: Secondary | ICD-10-CM | POA: Diagnosis not present

## 2017-09-28 DIAGNOSIS — I69322 Dysarthria following cerebral infarction: Secondary | ICD-10-CM | POA: Diagnosis not present

## 2017-09-28 DIAGNOSIS — E1122 Type 2 diabetes mellitus with diabetic chronic kidney disease: Secondary | ICD-10-CM | POA: Diagnosis not present

## 2017-09-29 DIAGNOSIS — I69392 Facial weakness following cerebral infarction: Secondary | ICD-10-CM | POA: Diagnosis not present

## 2017-09-29 DIAGNOSIS — E1122 Type 2 diabetes mellitus with diabetic chronic kidney disease: Secondary | ICD-10-CM | POA: Diagnosis not present

## 2017-09-29 DIAGNOSIS — I69354 Hemiplegia and hemiparesis following cerebral infarction affecting left non-dominant side: Secondary | ICD-10-CM | POA: Diagnosis not present

## 2017-09-29 DIAGNOSIS — I69322 Dysarthria following cerebral infarction: Secondary | ICD-10-CM | POA: Diagnosis not present

## 2017-09-29 DIAGNOSIS — I129 Hypertensive chronic kidney disease with stage 1 through stage 4 chronic kidney disease, or unspecified chronic kidney disease: Secondary | ICD-10-CM | POA: Diagnosis not present

## 2017-09-29 DIAGNOSIS — N189 Chronic kidney disease, unspecified: Secondary | ICD-10-CM | POA: Diagnosis not present

## 2017-09-30 DIAGNOSIS — E1122 Type 2 diabetes mellitus with diabetic chronic kidney disease: Secondary | ICD-10-CM | POA: Diagnosis not present

## 2017-09-30 DIAGNOSIS — I129 Hypertensive chronic kidney disease with stage 1 through stage 4 chronic kidney disease, or unspecified chronic kidney disease: Secondary | ICD-10-CM | POA: Diagnosis not present

## 2017-09-30 DIAGNOSIS — I69322 Dysarthria following cerebral infarction: Secondary | ICD-10-CM | POA: Diagnosis not present

## 2017-09-30 DIAGNOSIS — I69392 Facial weakness following cerebral infarction: Secondary | ICD-10-CM | POA: Diagnosis not present

## 2017-09-30 DIAGNOSIS — N189 Chronic kidney disease, unspecified: Secondary | ICD-10-CM | POA: Diagnosis not present

## 2017-09-30 DIAGNOSIS — I69354 Hemiplegia and hemiparesis following cerebral infarction affecting left non-dominant side: Secondary | ICD-10-CM | POA: Diagnosis not present

## 2017-10-02 DIAGNOSIS — E1122 Type 2 diabetes mellitus with diabetic chronic kidney disease: Secondary | ICD-10-CM | POA: Diagnosis not present

## 2017-10-02 DIAGNOSIS — I69392 Facial weakness following cerebral infarction: Secondary | ICD-10-CM | POA: Diagnosis not present

## 2017-10-02 DIAGNOSIS — I69354 Hemiplegia and hemiparesis following cerebral infarction affecting left non-dominant side: Secondary | ICD-10-CM | POA: Diagnosis not present

## 2017-10-02 DIAGNOSIS — I69322 Dysarthria following cerebral infarction: Secondary | ICD-10-CM | POA: Diagnosis not present

## 2017-10-02 DIAGNOSIS — N189 Chronic kidney disease, unspecified: Secondary | ICD-10-CM | POA: Diagnosis not present

## 2017-10-02 DIAGNOSIS — I129 Hypertensive chronic kidney disease with stage 1 through stage 4 chronic kidney disease, or unspecified chronic kidney disease: Secondary | ICD-10-CM | POA: Diagnosis not present

## 2017-10-04 DIAGNOSIS — N189 Chronic kidney disease, unspecified: Secondary | ICD-10-CM | POA: Diagnosis not present

## 2017-10-04 DIAGNOSIS — E1122 Type 2 diabetes mellitus with diabetic chronic kidney disease: Secondary | ICD-10-CM | POA: Diagnosis not present

## 2017-10-04 DIAGNOSIS — I69354 Hemiplegia and hemiparesis following cerebral infarction affecting left non-dominant side: Secondary | ICD-10-CM | POA: Diagnosis not present

## 2017-10-04 DIAGNOSIS — I69392 Facial weakness following cerebral infarction: Secondary | ICD-10-CM | POA: Diagnosis not present

## 2017-10-04 DIAGNOSIS — I129 Hypertensive chronic kidney disease with stage 1 through stage 4 chronic kidney disease, or unspecified chronic kidney disease: Secondary | ICD-10-CM | POA: Diagnosis not present

## 2017-10-04 DIAGNOSIS — I69322 Dysarthria following cerebral infarction: Secondary | ICD-10-CM | POA: Diagnosis not present

## 2017-10-05 DIAGNOSIS — N183 Chronic kidney disease, stage 3 (moderate): Secondary | ICD-10-CM | POA: Diagnosis not present

## 2017-10-05 DIAGNOSIS — D631 Anemia in chronic kidney disease: Secondary | ICD-10-CM | POA: Diagnosis not present

## 2017-10-05 DIAGNOSIS — N2581 Secondary hyperparathyroidism of renal origin: Secondary | ICD-10-CM | POA: Diagnosis not present

## 2017-10-05 DIAGNOSIS — R809 Proteinuria, unspecified: Secondary | ICD-10-CM | POA: Diagnosis not present

## 2017-10-05 DIAGNOSIS — E1122 Type 2 diabetes mellitus with diabetic chronic kidney disease: Secondary | ICD-10-CM | POA: Diagnosis not present

## 2017-10-05 DIAGNOSIS — I129 Hypertensive chronic kidney disease with stage 1 through stage 4 chronic kidney disease, or unspecified chronic kidney disease: Secondary | ICD-10-CM | POA: Diagnosis not present

## 2017-10-06 ENCOUNTER — Other Ambulatory Visit: Payer: Self-pay | Admitting: Nephrology

## 2017-10-06 DIAGNOSIS — R1032 Left lower quadrant pain: Secondary | ICD-10-CM

## 2017-10-07 ENCOUNTER — Other Ambulatory Visit: Payer: Self-pay | Admitting: Nephrology

## 2017-10-07 ENCOUNTER — Ambulatory Visit
Admission: RE | Admit: 2017-10-07 | Discharge: 2017-10-07 | Disposition: A | Discharge status: Home / Self Care | Payer: Medicare Other | Source: Ambulatory Visit | Attending: Nephrology | Admitting: Nephrology

## 2017-10-07 DIAGNOSIS — N281 Cyst of kidney, acquired: Secondary | ICD-10-CM | POA: Diagnosis not present

## 2017-10-07 DIAGNOSIS — D259 Leiomyoma of uterus, unspecified: Secondary | ICD-10-CM | POA: Insufficient documentation

## 2017-10-07 DIAGNOSIS — R1032 Left lower quadrant pain: Secondary | ICD-10-CM | POA: Diagnosis not present

## 2017-10-07 DIAGNOSIS — N83291 Other ovarian cyst, right side: Secondary | ICD-10-CM | POA: Diagnosis not present

## 2017-10-09 ENCOUNTER — Other Ambulatory Visit: Payer: Self-pay | Admitting: Unknown Physician Specialty

## 2017-10-09 MED ORDER — CARVEDILOL 6.25 MG PO TABS: 6.2500 mg | ORAL_TABLET | Freq: Two times a day (BID) | ORAL | 11 refills | Status: DC

## 2017-10-09 MED ORDER — CLOPIDOGREL BISULFATE 75 MG PO TABS: 75.0000 mg | ORAL_TABLET | Freq: Every day | ORAL | 11 refills | Status: DC

## 2017-10-09 NOTE — Telephone Encounter (Signed)
Routing to provider for refill

## 2017-10-09 NOTE — Telephone Encounter (Signed)
Prescriptions sent in by Malachy Mood, patient's daughter notified.

## 2017-10-09 NOTE — Telephone Encounter (Signed)
Daughter called again, asking about prescription. Tonya [name] is out and needs it. Please call daughter Tonya [name] regarding this.

## 2017-10-09 NOTE — Telephone Encounter (Signed)
Pt originally prescribed medication by Dr. Perrin Smack. Pt states she is out of medication. [facility] triage unsure if pt is able to get medication refilled at this location.

## 2017-10-09 NOTE — Telephone Encounter (Signed)
Copied from [place] #[id number]. Topic: Quick Communication - See Telephone Encounter >> Oct 09, 2017  3:54 PM Neva Seat wrote: Reason for CRM:   Called the pharmacy Tues. To get the medicine refilled  Pharmacy said that Dr. Julian Hy needs to approve the refill  Dr. Perrin Smack prescribed the meds below.  Pt out of Clopidogrel and Carvedilol  She has had a stroke and the medications help pt to not have a stroke

## 2017-10-12 DIAGNOSIS — E1165 Type 2 diabetes mellitus with hyperglycemia: Secondary | ICD-10-CM | POA: Diagnosis not present

## 2017-10-12 DIAGNOSIS — Z794 Long term (current) use of insulin: Secondary | ICD-10-CM | POA: Diagnosis not present

## 2017-10-13 DIAGNOSIS — I69322 Dysarthria following cerebral infarction: Secondary | ICD-10-CM | POA: Diagnosis not present

## 2017-10-13 DIAGNOSIS — N189 Chronic kidney disease, unspecified: Secondary | ICD-10-CM | POA: Diagnosis not present

## 2017-10-13 DIAGNOSIS — I69354 Hemiplegia and hemiparesis following cerebral infarction affecting left non-dominant side: Secondary | ICD-10-CM | POA: Diagnosis not present

## 2017-10-13 DIAGNOSIS — I129 Hypertensive chronic kidney disease with stage 1 through stage 4 chronic kidney disease, or unspecified chronic kidney disease: Secondary | ICD-10-CM | POA: Diagnosis not present

## 2017-10-13 DIAGNOSIS — I69392 Facial weakness following cerebral infarction: Secondary | ICD-10-CM | POA: Diagnosis not present

## 2017-10-13 DIAGNOSIS — E1122 Type 2 diabetes mellitus with diabetic chronic kidney disease: Secondary | ICD-10-CM | POA: Diagnosis not present

## 2017-10-14 ENCOUNTER — Encounter: Payer: Self-pay | Admitting: Unknown Physician Specialty

## 2017-10-14 ENCOUNTER — Ambulatory Visit (INDEPENDENT_AMBULATORY_CARE_PROVIDER_SITE_OTHER): Payer: Medicare Other | Admitting: Unknown Physician Specialty

## 2017-10-14 ENCOUNTER — Ambulatory Visit: Payer: Medicare Other | Admitting: Unknown Physician Specialty

## 2017-10-14 DIAGNOSIS — M25562 Pain in left knee: Secondary | ICD-10-CM

## 2017-10-14 DIAGNOSIS — G8929 Other chronic pain: Secondary | ICD-10-CM

## 2017-10-14 NOTE — Assessment & Plan Note (Addendum)
Reviewed benefits and risks of cortisone.  Discussed that cortisone does not slow down or cure arthritis.  It may increase her blood sugar.  Encouraged to wait for a steroid injection until pain limits activity.  Encouraged to be as active as possible

## 2017-10-14 NOTE — Progress Notes (Signed)
BP (!) 150/84   Pulse 77   Temp 98.2 F (36.8 C)   Wt 160 lb (72.6 kg)   LMP  (LMP Unknown)   SpO2 98%   BMI 25.06 kg/m    Subjective:    Patient ID: Tonya [name], female    DOB: February 17, 1946, 71 y.o.   MRN: [id number]  HPI: Tonya [name] is a 71 y.o. female  Chief Complaint  Patient presents with  . Knee Pain    knee injection, left knee   Pt is here for right knee injection based on complaints of left knee cracking, popping and pain from last visit.  However, today pt states problem is more the cracking and popping and not so much pain.    Relevant past medical, surgical, family and social history reviewed and updated as indicated. Interim medical history since our last visit reviewed. Allergies and medications reviewed and updated.  Review of Systems  Per HPI unless specifically indicated above     Objective:    BP (!) 150/84   Pulse 77   Temp 98.2 F (36.8 C)   Wt 160 lb (72.6 kg)   LMP  (LMP Unknown)   SpO2 98%   BMI 25.06 kg/m   Wt Readings from Last 3 Encounters:  10/14/17 160 lb (72.6 kg)  09/22/17 154 lb (69.9 kg)  09/21/17 155 lb 9.6 oz (70.6 kg)    Physical Exam  Constitutional: She is oriented to person, place, and time. She appears well-developed and well-nourished. No distress.  HENT:  Head: Normocephalic and atraumatic.  Eyes: Conjunctivae and lids are normal. Right eye exhibits no discharge. Left eye exhibits no discharge. No scleral icterus.  Cardiovascular: Normal rate.   Pulmonary/Chest: Effort normal.  Abdominal: Normal appearance. There is no splenomegaly or hepatomegaly.  Musculoskeletal: Normal range of motion.       Left knee: She exhibits normal range of motion and no swelling.  Neurological: She is alert and oriented to person, place, and time.  Skin: Skin is intact. No rash noted. No pallor.  Psychiatric: She has a normal mood and affect. Her behavior is normal. Judgment and thought content normal.    Results for orders  placed or performed in visit on 07/14/17  TSH  Result Value Ref Range   TSH 2.180 0.450 - 4.500 uIU/mL  CBC with Differential/Platelet  Result Value Ref Range   WBC 8.2 3.4 - 10.8 x10E3/uL   RBC 3.95 3.77 - 5.28 x10E6/uL   Hemoglobin 10.4 (L) 11.1 - 15.9 g/dL   Hematocrit 34.4 34.0 - 46.6 %   MCV 87 79 - 97 fL   MCH 26.3 (L) 26.6 - 33.0 pg   MCHC 30.2 (L) 31.5 - 35.7 g/dL   RDW 14.9 12.3 - 15.4 %   Platelets 307 150 - 379 x10E3/uL   Neutrophils 70 Not Estab. %   Lymphs 24 Not Estab. %   Monocytes 5 Not Estab. %   Eos 1 Not Estab. %   Basos 0 Not Estab. %   Neutrophils Absolute 5.7 1.4 - 7.0 x10E3/uL   Lymphocytes Absolute 2.0 0.7 - 3.1 x10E3/uL   Monocytes Absolute 0.4 0.1 - 0.9 x10E3/uL   EOS (ABSOLUTE) 0.1 0.0 - 0.4 x10E3/uL   Basophils Absolute 0.0 0.0 - 0.2 x10E3/uL   Immature Granulocytes 0 Not Estab. %   Immature Grans (Abs) 0.0 0.0 - 0.1 x10E3/uL      Assessment & Plan:   Problem List Items Addressed This Visit  Unprioritized   Chronic pain of left knee    Reviewed benefits and risks of cortisone.  Discussed that cortisone does not slow down or cure arthritis.  It may increase her blood sugar.  Encouraged to wait for a steroid injection until pain limits activity.  Encouraged to be as active as possible                Follow up plan: Return in about 3 months (around 01/14/2018).

## 2017-10-16 DIAGNOSIS — I10 Essential (primary) hypertension: Secondary | ICD-10-CM | POA: Diagnosis not present

## 2017-10-16 DIAGNOSIS — E1165 Type 2 diabetes mellitus with hyperglycemia: Secondary | ICD-10-CM | POA: Diagnosis not present

## 2017-10-16 DIAGNOSIS — E1122 Type 2 diabetes mellitus with diabetic chronic kidney disease: Secondary | ICD-10-CM | POA: Diagnosis not present

## 2017-10-16 DIAGNOSIS — E1121 Type 2 diabetes mellitus with diabetic nephropathy: Secondary | ICD-10-CM | POA: Diagnosis not present

## 2017-10-16 DIAGNOSIS — N183 Chronic kidney disease, stage 3 (moderate): Secondary | ICD-10-CM | POA: Diagnosis not present

## 2017-10-16 DIAGNOSIS — E113393 Type 2 diabetes mellitus with moderate nonproliferative diabetic retinopathy without macular edema, bilateral: Secondary | ICD-10-CM | POA: Diagnosis not present

## 2017-10-16 DIAGNOSIS — Z794 Long term (current) use of insulin: Secondary | ICD-10-CM | POA: Diagnosis not present

## 2017-10-22 DIAGNOSIS — I69354 Hemiplegia and hemiparesis following cerebral infarction affecting left non-dominant side: Secondary | ICD-10-CM | POA: Diagnosis not present

## 2017-10-22 DIAGNOSIS — I69322 Dysarthria following cerebral infarction: Secondary | ICD-10-CM | POA: Diagnosis not present

## 2017-10-22 DIAGNOSIS — E1122 Type 2 diabetes mellitus with diabetic chronic kidney disease: Secondary | ICD-10-CM | POA: Diagnosis not present

## 2017-10-22 DIAGNOSIS — I129 Hypertensive chronic kidney disease with stage 1 through stage 4 chronic kidney disease, or unspecified chronic kidney disease: Secondary | ICD-10-CM | POA: Diagnosis not present

## 2017-10-22 DIAGNOSIS — N189 Chronic kidney disease, unspecified: Secondary | ICD-10-CM | POA: Diagnosis not present

## 2017-10-22 DIAGNOSIS — I69392 Facial weakness following cerebral infarction: Secondary | ICD-10-CM | POA: Diagnosis not present

## 2017-10-23 ENCOUNTER — Telehealth: Payer: Self-pay

## 2017-10-23 NOTE — Telephone Encounter (Signed)
I don't prescribe these medications.  Endocrine does

## 2017-10-23 NOTE — Telephone Encounter (Signed)
Called and let Tonya [name] know what Tonya [name] said. Tonya [name] asked for the endocrinologist phone number and I provided it to her.

## 2017-10-23 NOTE — Telephone Encounter (Signed)
Copied from [place] [id number]. Topic: Inquiry >> Oct 23, 2017 10:46 AM Pricilla Handler wrote: Reason for CRM: Cyril Mourning from [facility] # [phone]) called. Cyril Mourning stated that they are seeing the patient because her blood pressure medication is interacting with her insulin. Cyril Mourning needs a call today.  >> Oct 23, 2017  2:42 PM Georgina Peer, CMA wrote: Called and left [place] a VM asking for her to please return my call. Left my direct line number on her VM. >> Oct 23, 2017  4:36 PM Georgina Peer, [place] wrote: Called and left another VM asking for a returned call on my direct line number.    Cyril Mourning returned my call. She states that there was a interaction listed on the patient's chart for actos and levemir and the patient was having some low readings. She states that the patient's fasting BS yesterday was 90.

## 2017-11-03 DIAGNOSIS — I69322 Dysarthria following cerebral infarction: Secondary | ICD-10-CM | POA: Diagnosis not present

## 2017-11-03 DIAGNOSIS — E1122 Type 2 diabetes mellitus with diabetic chronic kidney disease: Secondary | ICD-10-CM | POA: Diagnosis not present

## 2017-11-03 DIAGNOSIS — I69354 Hemiplegia and hemiparesis following cerebral infarction affecting left non-dominant side: Secondary | ICD-10-CM | POA: Diagnosis not present

## 2017-11-03 DIAGNOSIS — N189 Chronic kidney disease, unspecified: Secondary | ICD-10-CM | POA: Diagnosis not present

## 2017-11-03 DIAGNOSIS — I129 Hypertensive chronic kidney disease with stage 1 through stage 4 chronic kidney disease, or unspecified chronic kidney disease: Secondary | ICD-10-CM | POA: Diagnosis not present

## 2017-11-03 DIAGNOSIS — I69392 Facial weakness following cerebral infarction: Secondary | ICD-10-CM | POA: Diagnosis not present

## 2017-11-09 DIAGNOSIS — I69354 Hemiplegia and hemiparesis following cerebral infarction affecting left non-dominant side: Secondary | ICD-10-CM | POA: Diagnosis not present

## 2017-11-09 DIAGNOSIS — E1122 Type 2 diabetes mellitus with diabetic chronic kidney disease: Secondary | ICD-10-CM | POA: Diagnosis not present

## 2017-11-09 DIAGNOSIS — I69392 Facial weakness following cerebral infarction: Secondary | ICD-10-CM | POA: Diagnosis not present

## 2017-11-09 DIAGNOSIS — I69322 Dysarthria following cerebral infarction: Secondary | ICD-10-CM | POA: Diagnosis not present

## 2017-11-09 DIAGNOSIS — N189 Chronic kidney disease, unspecified: Secondary | ICD-10-CM | POA: Diagnosis not present

## 2017-11-09 DIAGNOSIS — I129 Hypertensive chronic kidney disease with stage 1 through stage 4 chronic kidney disease, or unspecified chronic kidney disease: Secondary | ICD-10-CM | POA: Diagnosis not present

## 2017-11-10 DIAGNOSIS — E1165 Type 2 diabetes mellitus with hyperglycemia: Secondary | ICD-10-CM | POA: Diagnosis not present

## 2017-11-10 DIAGNOSIS — E1121 Type 2 diabetes mellitus with diabetic nephropathy: Secondary | ICD-10-CM | POA: Diagnosis not present

## 2017-11-10 DIAGNOSIS — Z794 Long term (current) use of insulin: Secondary | ICD-10-CM | POA: Diagnosis not present

## 2017-11-10 DIAGNOSIS — E1122 Type 2 diabetes mellitus with diabetic chronic kidney disease: Secondary | ICD-10-CM | POA: Diagnosis not present

## 2017-11-10 DIAGNOSIS — E113393 Type 2 diabetes mellitus with moderate nonproliferative diabetic retinopathy without macular edema, bilateral: Secondary | ICD-10-CM | POA: Diagnosis not present

## 2017-11-10 DIAGNOSIS — N183 Chronic kidney disease, stage 3 (moderate): Secondary | ICD-10-CM | POA: Diagnosis not present

## 2017-11-15 ENCOUNTER — Other Ambulatory Visit: Payer: Self-pay | Admitting: Unknown Physician Specialty

## 2017-11-30 ENCOUNTER — Other Ambulatory Visit: Payer: Self-pay | Admitting: Unknown Physician Specialty

## 2017-12-10 DIAGNOSIS — I63511 Cerebral infarction due to unspecified occlusion or stenosis of right middle cerebral artery: Secondary | ICD-10-CM | POA: Diagnosis not present

## 2017-12-18 ENCOUNTER — Ambulatory Visit (INDEPENDENT_AMBULATORY_CARE_PROVIDER_SITE_OTHER): Payer: Medicare Other | Admitting: Family Medicine

## 2017-12-18 ENCOUNTER — Encounter: Payer: Self-pay | Admitting: Family Medicine

## 2017-12-18 VITALS — BP 98/65 | HR 88 | Temp 98.2°F | Wt 162.2 lb

## 2017-12-18 DIAGNOSIS — J069 Acute upper respiratory infection, unspecified: Secondary | ICD-10-CM

## 2017-12-18 MED ORDER — AZITHROMYCIN 250 MG PO TABS: ORAL_TABLET | ORAL | 0 refills | Status: DC

## 2017-12-18 MED ORDER — BENZONATATE 200 MG PO CAPS: 200.0000 mg | ORAL_CAPSULE | Freq: Two times a day (BID) | ORAL | 0 refills | Status: DC | PRN

## 2017-12-18 MED ORDER — ALBUTEROL SULFATE HFA 108 (90 BASE) MCG/ACT IN AERS: 2.0000 | INHALATION_SPRAY | Freq: Four times a day (QID) | RESPIRATORY_TRACT | 0 refills | Status: DC | PRN

## 2017-12-18 MED ORDER — PREDNISONE 10 MG PO TABS: ORAL_TABLET | ORAL | 0 refills | Status: DC

## 2017-12-18 NOTE — Progress Notes (Signed)
BP 98/65 (BP Location: Right Arm, Patient Position: Sitting, Cuff Size: Normal)   Pulse 88   Temp 98.2 F (36.8 C) (Temporal)   Wt 162 lb 3.2 oz (73.6 kg)   LMP  (LMP Unknown)   SpO2 95%   BMI 25.40 kg/m    Subjective:    Patient ID: Tonya [name], female    DOB: 05-06-1946, 72 y.o.   MRN: [id number]  HPI: Tonya [name] is a 72 y.o. female  Chief Complaint  Patient presents with  . Cough    x's 1 week. Severe.   Significant productive cough, chest tightness, SOB, fatigue as she's coughing through the night x 1 week. Trying honey and hot water as well as OTC cough medication with no relief. Denies fever, chills, body aches, ear pain, sinus pain and pressure. Several sick contacts over the holidays.   Relevant past medical, surgical, family and social history reviewed and updated as indicated. Interim medical history since our last visit reviewed. Allergies and medications reviewed and updated.  Review of Systems  Constitutional: Positive for fatigue.  HENT: Negative.   Eyes: Negative.   Respiratory: Positive for cough and chest tightness.   Cardiovascular: Negative.   Gastrointestinal: Negative.   Skin: Negative.   Neurological: Negative.   Psychiatric/Behavioral: Negative.    Per HPI unless specifically indicated above     Objective:    BP 98/65 (BP Location: Right Arm, Patient Position: Sitting, Cuff Size: Normal)   Pulse 88   Temp 98.2 F (36.8 C) (Temporal)   Wt 162 lb 3.2 oz (73.6 kg)   LMP  (LMP Unknown)   SpO2 95%   BMI 25.40 kg/m   Wt Readings from Last 3 Encounters:  12/18/17 162 lb 3.2 oz (73.6 kg)  10/14/17 160 lb (72.6 kg)  09/22/17 154 lb (69.9 kg)    Physical Exam  Constitutional: She is oriented to person, place, and time. She appears well-developed and well-nourished. No distress.  HENT:  Head: Atraumatic.  Oropharynx injected and edematous Nasal mucosa injected  Eyes: Conjunctivae are normal. Pupils are equal, round, and reactive to  light. No scleral icterus.  Neck: Normal range of motion. Neck supple.  Cardiovascular: Normal rate, regular rhythm and normal heart sounds.  Pulmonary/Chest: Effort normal. No respiratory distress. She has wheezes. She has no rales.  Hacking cough with even a mildly deep breath, difficult to assess full extent of wheezing and constriction  Musculoskeletal: Normal range of motion.  Lymphadenopathy:    She has no cervical adenopathy.  Neurological: She is alert and oriented to person, place, and time.  Skin: Skin is warm and dry.  Psychiatric: She has a normal mood and affect. Her behavior is normal.  Nursing note and vitals reviewed.     Assessment & Plan:   Problem List Items Addressed This Visit    None    Visit Diagnoses    Upper respiratory tract infection, unspecified type    -  Primary   Relevant Medications   azithromycin (ZITHROMAX) 250 MG tablet    Mild improvement with in office nebulizer tx. Discussed CXR as breath sounds were difficult to examine given extent of coughing. Pt declines at this time, wanting to try medication first. Will tx with azithromycin, prednisone taper, tessalon, albuterol, and tussionex. Risks and side effects reviewed, sedation precautions discussed. F/u if no better over the next few days.   Follow up plan: Return if symptoms worsen or fail to improve.

## 2017-12-18 NOTE — Patient Instructions (Signed)
Start the azithromycin (antibiotic) when you get home tonight as directed  Start the prednisone taper pack tomorrow morning with your breakfast Take the tessalon perles and inhaler as needed

## 2017-12-28 ENCOUNTER — Other Ambulatory Visit: Payer: Self-pay | Admitting: Unknown Physician Specialty

## 2018-01-07 DIAGNOSIS — D631 Anemia in chronic kidney disease: Secondary | ICD-10-CM | POA: Diagnosis not present

## 2018-01-07 DIAGNOSIS — N2581 Secondary hyperparathyroidism of renal origin: Secondary | ICD-10-CM | POA: Diagnosis not present

## 2018-01-07 DIAGNOSIS — R809 Proteinuria, unspecified: Secondary | ICD-10-CM | POA: Diagnosis not present

## 2018-01-07 DIAGNOSIS — E1122 Type 2 diabetes mellitus with diabetic chronic kidney disease: Secondary | ICD-10-CM | POA: Diagnosis not present

## 2018-01-07 DIAGNOSIS — N183 Chronic kidney disease, stage 3 (moderate): Secondary | ICD-10-CM | POA: Diagnosis not present

## 2018-01-07 DIAGNOSIS — I129 Hypertensive chronic kidney disease with stage 1 through stage 4 chronic kidney disease, or unspecified chronic kidney disease: Secondary | ICD-10-CM | POA: Diagnosis not present

## 2018-01-15 ENCOUNTER — Ambulatory Visit (INDEPENDENT_AMBULATORY_CARE_PROVIDER_SITE_OTHER): Payer: Medicare Other | Admitting: Unknown Physician Specialty

## 2018-01-15 ENCOUNTER — Encounter: Payer: Self-pay | Admitting: Unknown Physician Specialty

## 2018-01-15 DIAGNOSIS — E1140 Type 2 diabetes mellitus with diabetic neuropathy, unspecified: Secondary | ICD-10-CM

## 2018-01-15 DIAGNOSIS — M542 Cervicalgia: Secondary | ICD-10-CM | POA: Insufficient documentation

## 2018-01-15 DIAGNOSIS — M67911 Unspecified disorder of synovium and tendon, right shoulder: Secondary | ICD-10-CM

## 2018-01-15 DIAGNOSIS — E1165 Type 2 diabetes mellitus with hyperglycemia: Secondary | ICD-10-CM

## 2018-01-15 DIAGNOSIS — I10 Essential (primary) hypertension: Secondary | ICD-10-CM | POA: Diagnosis not present

## 2018-01-15 DIAGNOSIS — N184 Chronic kidney disease, stage 4 (severe): Secondary | ICD-10-CM

## 2018-01-15 DIAGNOSIS — M67912 Unspecified disorder of synovium and tendon, left shoulder: Secondary | ICD-10-CM

## 2018-01-15 DIAGNOSIS — M67919 Unspecified disorder of synovium and tendon, unspecified shoulder: Secondary | ICD-10-CM | POA: Insufficient documentation

## 2018-01-15 MED ORDER — TRAMADOL HCL 50 MG PO TABS: 50.0000 mg | ORAL_TABLET | Freq: Three times a day (TID) | ORAL | 0 refills | Status: DC | PRN

## 2018-01-15 NOTE — Assessment & Plan Note (Signed)
With neck pain.  Dr. [name] doing labs soon.

## 2018-01-15 NOTE — Assessment & Plan Note (Signed)
Stable.  Stage 3 last visit with Dr. Abigail Butts

## 2018-01-15 NOTE — Assessment & Plan Note (Signed)
With Dr. [name].  Last visit poor control.  Suspect contributing to muscle pain

## 2018-01-15 NOTE — Assessment & Plan Note (Addendum)
Tender around neck area.  Discussed heat and gentle stretching.  ? Related to high blood sugar with recent prednisone

## 2018-01-15 NOTE — Assessment & Plan Note (Addendum)
With neck myocitis.  Unable to prescribe Prednisone due to sugar or NSAIDs due kidney function.  Will rx Tramadol 50 mg TID prn.  Take with Tylenol.  Consider steroid injection

## 2018-01-15 NOTE — Assessment & Plan Note (Signed)
Stable, continue present medications.   

## 2018-01-15 NOTE — Progress Notes (Signed)
BP 121/82   Pulse 96   Temp 97.9 F (36.6 C) (Oral)   Wt 168 lb 3.2 oz (76.3 kg)   LMP  (LMP Unknown)   SpO2 100%   BMI 26.34 kg/m    Subjective:    Patient ID: Tonya [name], female    DOB: Jul 06, 1946, 72 y.o.   MRN: [id number]  HPI: Tonya [name] is a 72 y.o. female  Chief Complaint  Patient presents with  . Diabetes    pt states last eye exam in chart is correct  . Hypertension  . Hypothyroidism   Hypertension Using medications without difficulty Average home BPs are 120's/70's               No problems or lightheadedness No chest pain with exertion or shortness of breath No Edema  Hyperlipidemia Using medications without problems: Diet compliance: Decreasing fast food Exercise: Just leg lifts.    Diabetes Through Dr. [name].  Last Hgb A1C with 10.1.  Due to recheck this month.  Taking 15 u of Levemir and 12-18 u of Humalog.  Blood sugar in the morning is "better" and sometimes hypolycemic  CKD Seeing [facility].  Note reviewed and doing well.  No changes made   Arm pain Bilateral arm pain for 2 weeks, left arm more than right.  Hurts to raise arms.  Not doing PT which she was getting post CVA.  Was given Prednisone last visit due to significant RAD  Relevant past medical, surgical, family and social history reviewed and updated as indicated. Interim medical history since our last visit reviewed. Allergies and medications reviewed and updated.  Review of Systems  Constitutional: Positive for fatigue.  HENT: Negative.   Respiratory: Negative.   Cardiovascular: Negative.   Psychiatric/Behavioral: Negative.     Per HPI unless specifically indicated above     Objective:    BP 121/82   Pulse 96   Temp 97.9 F (36.6 C) (Oral)   Wt 168 lb 3.2 oz (76.3 kg)   LMP  (LMP Unknown)   SpO2 100%   BMI 26.34 kg/m   Wt Readings from Last 3 Encounters:  01/15/18 168 lb 3.2 oz (76.3 kg)  12/18/17 162 lb 3.2 oz (73.6 kg)  10/14/17 160 lb (72.6 kg)    Physical Exam  Constitutional: She is oriented to person, place, and time. She appears well-developed and well-nourished. No distress.  HENT:  Head: Normocephalic and atraumatic.  Eyes: Conjunctivae and lids are normal. Right eye exhibits no discharge. Left eye exhibits no discharge. No scleral icterus.  Neck: Normal range of motion. Neck supple. No JVD present. Carotid bruit is not present.  Cardiovascular: Normal rate, regular rhythm and normal heart sounds.  Pulmonary/Chest: Effort normal and breath sounds normal.  Abdominal: Normal appearance. There is no splenomegaly or hepatomegaly.  Musculoskeletal:       Right shoulder: She exhibits decreased range of motion, tenderness and decreased strength. She exhibits no swelling.       Left shoulder: She exhibits decreased range of motion, tenderness and decreased strength. She exhibits no bony tenderness.  Weakness.  Decreased ROM.  Tender myofacial area  Neurological: She is alert and oriented to person, place, and time.  Skin: Skin is warm, dry and intact. No rash noted. No pallor.  Psychiatric: She has a normal mood and affect. Her behavior is normal. Judgment and thought content normal.    Results for orders placed or performed in visit on 07/14/17  TSH  Result  Value Ref Range   TSH 2.180 0.450 - 4.500 uIU/mL  CBC with Differential/Platelet  Result Value Ref Range   WBC 8.2 3.4 - 10.8 x10E3/uL   RBC 3.95 3.77 - 5.28 x10E6/uL   Hemoglobin 10.4 (L) 11.1 - 15.9 g/dL   Hematocrit 34.4 34.0 - 46.6 %   MCV 87 79 - 97 fL   MCH 26.3 (L) 26.6 - 33.0 pg   MCHC 30.2 (L) 31.5 - 35.7 g/dL   RDW 14.9 12.3 - 15.4 %   Platelets 307 150 - 379 x10E3/uL   Neutrophils 70 Not Estab. %   Lymphs 24 Not Estab. %   Monocytes 5 Not Estab. %   Eos 1 Not Estab. %   Basos 0 Not Estab. %   Neutrophils Absolute 5.7 1.4 - 7.0 x10E3/uL   Lymphocytes Absolute 2.0 0.7 - 3.1 x10E3/uL   Monocytes Absolute 0.4 0.1 - 0.9 x10E3/uL   EOS (ABSOLUTE) 0.1 0.0 - 0.4  x10E3/uL   Basophils Absolute 0.0 0.0 - 0.2 x10E3/uL   Immature Granulocytes 0 Not Estab. %   Immature Grans (Abs) 0.0 0.0 - 0.1 x10E3/uL      Assessment & Plan:   Problem List Items Addressed This Visit      Unprioritized   Chronic kidney disease, stage 4, severely decreased GFR (HCC)    Stable.  Stage 3 last visit with Dr. Abigail Butts      Hypertension    Stable, continue present medications.        Musculoskeletal neck pain    Tender around neck area.  Discussed heat and gentle stretching.  ? 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 [name].  Last visit poor control.  Suspect contributing to muscle pain      Rotator cuff disorder    With neck myocitis.  Unable to prescribe Prednisone due to sugar or NSAIDs due kidney function.  Will rx Tramadol 50 mg TID prn.  Take with Tylenol.  Consider steroid injection          Follow up plan: Return in about 3 months (around 04/14/2018) for or for rotator cuff inj prn.

## 2018-01-16 LAB — CK: Total CK: 58 U/L (ref 24–173)

## 2018-02-04 DIAGNOSIS — Z794 Long term (current) use of insulin: Secondary | ICD-10-CM | POA: Diagnosis not present

## 2018-02-04 DIAGNOSIS — E1165 Type 2 diabetes mellitus with hyperglycemia: Secondary | ICD-10-CM | POA: Diagnosis not present

## 2018-02-09 ENCOUNTER — Other Ambulatory Visit: Payer: Self-pay | Admitting: Unknown Physician Specialty

## 2018-02-10 DIAGNOSIS — E1122 Type 2 diabetes mellitus with diabetic chronic kidney disease: Secondary | ICD-10-CM | POA: Diagnosis not present

## 2018-02-10 DIAGNOSIS — E1165 Type 2 diabetes mellitus with hyperglycemia: Secondary | ICD-10-CM | POA: Diagnosis not present

## 2018-02-10 DIAGNOSIS — N183 Chronic kidney disease, stage 3 (moderate): Secondary | ICD-10-CM | POA: Diagnosis not present

## 2018-02-10 DIAGNOSIS — E113393 Type 2 diabetes mellitus with moderate nonproliferative diabetic retinopathy without macular edema, bilateral: Secondary | ICD-10-CM | POA: Diagnosis not present

## 2018-02-10 DIAGNOSIS — Z794 Long term (current) use of insulin: Secondary | ICD-10-CM | POA: Diagnosis not present

## 2018-02-10 DIAGNOSIS — E1121 Type 2 diabetes mellitus with diabetic nephropathy: Secondary | ICD-10-CM | POA: Diagnosis not present

## 2018-03-08 ENCOUNTER — Other Ambulatory Visit: Payer: Self-pay | Admitting: Unknown Physician Specialty

## 2018-04-14 ENCOUNTER — Encounter: Payer: Self-pay | Admitting: Unknown Physician Specialty

## 2018-04-14 ENCOUNTER — Ambulatory Visit (INDEPENDENT_AMBULATORY_CARE_PROVIDER_SITE_OTHER): Payer: Medicare Other | Admitting: Unknown Physician Specialty

## 2018-04-14 DIAGNOSIS — I10 Essential (primary) hypertension: Secondary | ICD-10-CM | POA: Diagnosis not present

## 2018-04-14 DIAGNOSIS — R42 Dizziness and giddiness: Secondary | ICD-10-CM | POA: Insufficient documentation

## 2018-04-14 DIAGNOSIS — M791 Myalgia, unspecified site: Secondary | ICD-10-CM | POA: Insufficient documentation

## 2018-04-14 DIAGNOSIS — E1140 Type 2 diabetes mellitus with diabetic neuropathy, unspecified: Secondary | ICD-10-CM

## 2018-04-14 DIAGNOSIS — R21 Rash and other nonspecific skin eruption: Secondary | ICD-10-CM | POA: Diagnosis not present

## 2018-04-14 DIAGNOSIS — E1165 Type 2 diabetes mellitus with hyperglycemia: Secondary | ICD-10-CM

## 2018-04-14 DIAGNOSIS — R55 Syncope and collapse: Secondary | ICD-10-CM

## 2018-04-14 LAB — CBC WITH DIFFERENTIAL/PLATELET
HEMATOCRIT: 31.2 % — AB (ref 34.0–46.6)
HEMOGLOBIN: 10.4 g/dL — AB (ref 11.1–15.9)
LYMPHS ABS: 1.4 10*3/uL (ref 0.7–3.1)
Lymphs: 21 %
MCH: 28 pg (ref 26.6–33.0)
MCHC: 33.3 g/dL (ref 31.5–35.7)
MCV: 84 fL (ref 79–97)
MID (Absolute): 0.4 10*3/uL (ref 0.1–1.6)
MID: 6 %
NEUTROS PCT: 73 %
Neutrophils Absolute: 4.9 10*3/uL (ref 1.4–7.0)
Platelets: 258 10*3/uL (ref 150–379)
RBC: 3.71 x10E6/uL — ABNORMAL LOW (ref 3.77–5.28)
RDW: 13.7 % (ref 12.3–15.4)
WBC: 6.7 10*3/uL (ref 3.4–10.8)

## 2018-04-14 NOTE — Assessment & Plan Note (Signed)
Last Hgb A1C is 8.2% showing some improvement.  Continue f/u with Dr. [name]

## 2018-04-14 NOTE — Progress Notes (Signed)
BP 120/75   Pulse 98   Temp 97.9 F (36.6 C) (Oral)   Ht 5' 7"  (1.702 m)   Wt 166 lb 12.8 oz (75.7 kg)   LMP  (LMP Unknown)   SpO2 98%   BMI 26.12 kg/m    Subjective:    Patient ID: Tonya [name], female    DOB: [birth year], 72 y.o.   MRN: [id number]  HPI: Tonya [name] is a 72 y.o. female  Chief Complaint  Patient presents with  . Diabetes    pt states she has not had an eye exam this year  . Hyperlipidemia  . Hypertension  . Rash    pt states she has been noticing that she gets a bump on her somehwere and then a little rash comes up, states she has noticied this for the past 2 weeks and happens in various places   Diabetes Reviewed notes from Dr. [name].  Hgb A1C is improved to 8.2.  Takes Levimir 20 units and Humalog prn.    CKD Follows with Dr. Abigail Butts. Not reviewed.  Last labs done in October  Shoulder pain Pain radiates down from neck to shoulders.    Pre-syncope Often when in the kitchen cooking she feels like "I'm going to fall out."  Denies SOB or palpitations.  Skin changes Pt with various areas on her body, comes and goes, of small bums that transform into dry and scaley patches.  No itching  Relevant past medical, surgical, family and social history reviewed and updated as indicated. Interim medical history since our last visit reviewed. Allergies and medications reviewed and updated.  Review of Systems  Per HPI unless specifically indicated above     Objective:    BP 120/75   Pulse 98   Temp 97.9 F (36.6 C) (Oral)   Ht 5' 7"  (1.702 m)   Wt 166 lb 12.8 oz (75.7 kg)   LMP  (LMP Unknown)   SpO2 98%   BMI 26.12 kg/m   Wt Readings from Last 3 Encounters:  04/14/18 166 lb 12.8 oz (75.7 kg)  01/15/18 168 lb 3.2 oz (76.3 kg)  12/18/17 162 lb 3.2 oz (73.6 kg)    Note that SBP 88 upon standing  Physical Exam  Constitutional: She is oriented to person, place, and time. She appears well-developed and well-nourished. No distress.  HENT:    Head: Normocephalic and atraumatic.  Eyes: Conjunctivae and lids are normal. Right eye exhibits no discharge. Left eye exhibits no discharge. No scleral icterus.  Neck: Normal range of motion. Neck supple. No JVD present. Carotid bruit is not present.  Cardiovascular: Normal rate, regular rhythm and normal heart sounds.  Pulmonary/Chest: Effort normal and breath sounds normal.  Abdominal: Normal appearance. There is no splenomegaly or hepatomegaly.  Musculoskeletal: Normal range of motion.  Significant myofacial tenderness neck and shoulders.    Neurological: She is alert and oriented to person, place, and time.  Skin: Skin is warm, dry and intact. No rash noted. No pallor.  Psychiatric: She has a normal mood and affect. Her behavior is normal. Judgment and thought content normal.    Results for orders placed or performed in visit on 01/15/18  CK (Creatine Kinase)  Result Value Ref Range   Total CK 58 24 - 173 U/L      Assessment & Plan:   Problem List Items Addressed This Visit      Unprioritized   Hypertension    Stop Amlodipine due to orthostatic changes.  Recheck 1 month      Myalgia    Significant tenderness neck and shoulders.  Getting worse.  Check CRP, ANA, Rheumatoid panel..        Relevant Orders   Comprehensive metabolic panel   Lipid Panel w/o Chol/HDL Ratio   Rheumatoid Arthritis Profile   ANA w/Reflex   C-reactive protein   Sed Rate (ESR)   Poorly controlled type 2 diabetes mellitus with neuropathy (HCC)    Last Hgb A1C is 8.2% showing some improvement.  Continue f/u with Dr. [name]      Postural dizziness with presyncope    Suspect due to hypotension upon standing.  DC Amlodipine      Relevant Orders   Comprehensive metabolic panel   Lipid Panel w/o Chol/HDL Ratio   CBC With Differential/Platelet   Rash    Non-specific eczemous changes.  Will continue to monitor           Follow up plan: Return in about 1 month (around 05/12/2018).

## 2018-04-14 NOTE — Assessment & Plan Note (Signed)
Suspect due to hypotension upon standing.  DC Amlodipine

## 2018-04-14 NOTE — Assessment & Plan Note (Signed)
Stop Amlodipine due to orthostatic changes.  Recheck 1 month

## 2018-04-14 NOTE — Patient Instructions (Signed)
Discontinue Amlodipine.

## 2018-04-14 NOTE — Assessment & Plan Note (Signed)
Non-specific eczemous changes.  Will continue to monitor

## 2018-04-14 NOTE — Assessment & Plan Note (Signed)
Significant tenderness neck and shoulders.  Getting worse.  Check CRP, ANA, Rheumatoid panel.[name]

## 2018-04-16 LAB — COMPREHENSIVE METABOLIC PANEL WITH GFR
ALT: 11 IU/L (ref 0–32)
AST: 17 IU/L (ref 0–40)
Albumin/Globulin Ratio: 1.7 (ref 1.2–2.2)
Albumin: 3.8 g/dL (ref 3.5–4.8)
Alkaline Phosphatase: 100 IU/L (ref 39–117)
BUN/Creatinine Ratio: 19 (ref 12–28)
BUN: 45 mg/dL — ABNORMAL HIGH (ref 8–27)
Bilirubin Total: <0.2 mg/dL (ref 0.0–1.2)
CO2: 23 mmol/L (ref 20–29)
Calcium: 9.1 mg/dL (ref 8.7–10.3)
Chloride: 104 mmol/L (ref 96–106)
Creatinine, Ser: 2.35 mg/dL — ABNORMAL HIGH (ref 0.57–1.00)
GFR calc Af Amer: 23 mL/min/1.73 — ABNORMAL LOW (ref >59)
GFR calc non Af Amer: 20 mL/min/1.73 — ABNORMAL LOW (ref >59)
Globulin, Total: 2.2 g/dL (ref 1.5–4.5)
Glucose: 306 mg/dL — ABNORMAL HIGH (ref 65–99)
Potassium: 4.9 mmol/L (ref 3.5–5.2)
Sodium: 142 mmol/L (ref 134–144)
Total Protein: 6 g/dL (ref 6.0–8.5)

## 2018-04-16 LAB — C-REACTIVE PROTEIN: CRP: 1.1 mg/L (ref 0.0–4.9)

## 2018-04-16 LAB — ANA W/REFLEX: Anti Nuclear Antibody(ANA): NEGATIVE

## 2018-04-16 LAB — RHEUMATOID ARTHRITIS PROFILE: Cyclic Citrullin Peptide Ab: 5 units (ref 0–19)

## 2018-04-16 LAB — SEDIMENTATION RATE: Sed Rate: 14 mm/h (ref 0–40)

## 2018-05-03 DIAGNOSIS — R809 Proteinuria, unspecified: Secondary | ICD-10-CM | POA: Diagnosis not present

## 2018-05-03 DIAGNOSIS — I129 Hypertensive chronic kidney disease with stage 1 through stage 4 chronic kidney disease, or unspecified chronic kidney disease: Secondary | ICD-10-CM | POA: Diagnosis not present

## 2018-05-03 DIAGNOSIS — E1122 Type 2 diabetes mellitus with diabetic chronic kidney disease: Secondary | ICD-10-CM | POA: Diagnosis not present

## 2018-05-03 DIAGNOSIS — N184 Chronic kidney disease, stage 4 (severe): Secondary | ICD-10-CM | POA: Diagnosis not present

## 2018-05-10 ENCOUNTER — Emergency Department: Payer: Medicare Other

## 2018-05-10 ENCOUNTER — Emergency Department
Admission: EM | Admit: 2018-05-10 | Discharge: 2018-05-10 | Disposition: A | Discharge status: Home / Self Care | Payer: Medicare Other | Attending: Emergency Medicine | Admitting: Emergency Medicine

## 2018-05-10 ENCOUNTER — Other Ambulatory Visit: Payer: Self-pay

## 2018-05-10 DIAGNOSIS — Z794 Long term (current) use of insulin: Secondary | ICD-10-CM | POA: Diagnosis not present

## 2018-05-10 DIAGNOSIS — I129 Hypertensive chronic kidney disease with stage 1 through stage 4 chronic kidney disease, or unspecified chronic kidney disease: Secondary | ICD-10-CM | POA: Diagnosis not present

## 2018-05-10 DIAGNOSIS — R4781 Slurred speech: Secondary | ICD-10-CM | POA: Insufficient documentation

## 2018-05-10 DIAGNOSIS — N183 Chronic kidney disease, stage 3 (moderate): Secondary | ICD-10-CM | POA: Diagnosis not present

## 2018-05-10 DIAGNOSIS — F419 Anxiety disorder, unspecified: Secondary | ICD-10-CM | POA: Diagnosis not present

## 2018-05-10 DIAGNOSIS — M25551 Pain in right hip: Secondary | ICD-10-CM | POA: Diagnosis not present

## 2018-05-10 DIAGNOSIS — F329 Major depressive disorder, single episode, unspecified: Secondary | ICD-10-CM | POA: Insufficient documentation

## 2018-05-10 DIAGNOSIS — Z7982 Long term (current) use of aspirin: Secondary | ICD-10-CM | POA: Diagnosis not present

## 2018-05-10 DIAGNOSIS — Z79899 Other long term (current) drug therapy: Secondary | ICD-10-CM | POA: Insufficient documentation

## 2018-05-10 DIAGNOSIS — E1122 Type 2 diabetes mellitus with diabetic chronic kidney disease: Secondary | ICD-10-CM | POA: Diagnosis not present

## 2018-05-10 MED ORDER — KETOROLAC TROMETHAMINE 30 MG/ML IJ SOLN
INTRAMUSCULAR | Status: AC
  Filled 2018-05-10: qty 1

## 2018-05-10 MED ORDER — PREDNISONE 20 MG PO TABS
20.0000 mg | ORAL_TABLET | Freq: Once | ORAL | Status: AC
  Administered 2018-05-10: 20 mg via ORAL
  Filled 2018-05-10: qty 1

## 2018-05-10 MED ORDER — KETOROLAC TROMETHAMINE 30 MG/ML IJ SOLN
30.0000 mg | Freq: Once | INTRAMUSCULAR | Status: AC
  Administered 2018-05-10: 30 mg via INTRAMUSCULAR

## 2018-05-10 MED ORDER — PREDNISONE 10 MG PO TABS: 10.0000 mg | ORAL_TABLET | Freq: Every day | ORAL | 0 refills | Status: AC

## 2018-05-10 NOTE — ED Notes (Signed)
ED Provider at bedside. 

## 2018-05-10 NOTE — ED Provider Notes (Signed)
[facility] Emergency Department Provider Note   ____________________________________________    I have reviewed the triage vital signs and the nursing notes.   HISTORY  Chief Complaint Hip Pain     HPI Tonya [name] is a 72 y.o. female with a history of diabetes, CVA in the past who presents with complaints of right hip pain.  Patient reports over the last 2 days she has had moderate to severe sharp burning pain in her right hip, occasionally it seems to radiate into her right leg.  Today it is somewhat better.  The pain is in a similar location to where she had shingles in the past although this was many years ago.  Denies fevers or chills.  No falls or trauma.  Daughter is concerned because the patient is slurring her speech although she admits that she has done this intermittently since having her stroke in September   Past Medical History:  Diagnosis Date  . CKD (chronic kidney disease), stage III ([place])   . Diabetes mellitus without complication ([place])   . Diastolic dysfunction    a. 05/2017 Echo: EF 55-60%, Gr1 DD, mildly dil LA.  [name] Hyperlipidemia   . Hypertension     Patient Active Problem List   Diagnosis Date Noted  . Myalgia 04/14/2018  . Postural dizziness with presyncope 04/14/2018  . Rash 04/14/2018  . Rotator cuff disorder 01/15/2018  . Musculoskeletal neck pain 01/15/2018  . Hypertension 09/22/2017  . Swelling of limb 09/22/2017  . Chronic pain of left knee 09/21/2017  . Advanced care planning/counseling discussion 04/13/2017  . Post herpetic neuralgia 10/10/2016  . Hip bursitis 09/18/2015  . Anxiety and depression 08/21/2015  . Hypertensive chronic kidney disease with stage 1 through stage 4 chronic kidney disease, or unspecified chronic kidney disease 08/21/2015  . Poorly controlled type 2 diabetes mellitus with neuropathy ([place]) 05/01/2015  . Mixed hyperlipidemia 05/01/2015  . Chronic kidney disease, stage 4, severely  decreased GFR ([place]) 05/01/2015  . Encounter for long-term (current) use of insulin ([place]) 05/01/2015    Past Surgical History:  Procedure Laterality Date  . COLONOSCOPY  12/15/2006  . TUBAL LIGATION      Prior to Admission medications   Medication Sig Start Date End Date Taking? Authorizing Provider  albuterol (PROVENTIL HFA;VENTOLIN HFA) 108 (90 Base) MCG/ACT inhaler Inhale 2 puffs into the lungs every 6 (six) hours as needed for wheezing or shortness of breath. 12/18/17   Volney American, PA-C  aspirin 81 MG tablet Take 81 mg by mouth daily.    [provider]  carvedilol (COREG) 6.25 MG tablet Take 1 tablet (6.25 mg total) by mouth 2 (two) times daily. 10/09/17 10/09/18  Kathrine Haddock, NP  furosemide (LASIX) 40 MG tablet Take 1 tablet (40 mg total) by mouth daily. 03/25/17   Kathrine Haddock, NP  Insulin Detemir (LEVEMIR FLEXPEN) 100 UNIT/ML Pen Inject 20 Units into the skin at bedtime. 09/10/17 09/10/18  [provider]  NOVOFINE 32G X 6 MM MISC  06/16/17   [provider]  olmesartan (BENICAR) 40 MG tablet TAKE 1 TABLET(40 MG) BY MOUTH DAILY 02/10/18   Kathrine Haddock, NP  omeprazole (PRILOSEC) 20 MG capsule TAKE 1 CAPSULE(20 MG) BY MOUTH TWICE DAILY 12/29/17   Kathrine Haddock, NP  ONE TOUCH ULTRA TEST test strip USE TO TEST BLOOD SUGAR THREE TIMES DAILY 07/21/17   Kathrine Haddock, NP  pioglitazone (ACTOS) 15 MG tablet Take 15 mg by mouth daily. 08/25/17  [provider]  predniSONE (DELTASONE) 10 MG tablet Take 1 tablet (10 mg total) by mouth daily for 5 days. 05/10/18 05/15/18  Lavonia Drafts, MD  rosuvastatin (CRESTOR) 5 MG tablet Take 1 tablet (5 mg total) by mouth daily. 09/03/17 12/02/17  Theora Gianotti, NP     Allergies Atorvastatin; Pravastatin; and Trulicity [dulaglutide]  Family History  Problem Relation Age of Onset  . Diabetes Mother   . Stroke Mother   . Hyperlipidemia Father   . Hypertension Father   . Diabetes Sister   . Diabetes  Brother   . Gout Son   . Diabetes Brother   . Kidney disease Son   . Breast cancer Neg Hx     Social History Social History   Tobacco Use  . Smoking status: Never Smoker  . Smokeless tobacco: Never Used  Substance Use Topics  . Alcohol use: No    Alcohol/week: 0.0 oz  . Drug use: No    Review of Systems  Constitutional: No fever/chills Eyes: No visual changes.  ENT: No neck pain Cardiovascular: Denies chest pain. Respiratory: Denies shortness of breath. Gastrointestinal: No abdominal pain.  No nausea, no vomiting.   Genitourinary: Negative for dysuria. Musculoskeletal: As above Skin: Negative for rash. Neurological: Negative for headaches   ____________________________________________   PHYSICAL EXAM:  VITAL SIGNS: ED Triage Vitals  Enc Vitals Group     BP 05/10/18 1831 122/77     Pulse Rate 05/10/18 1831 100     Resp 05/10/18 1831 18     Temp 05/10/18 1831 98.2 F (36.8 C)     Temp src --      SpO2 05/10/18 1831 99 %     Weight 05/10/18 1832 75.8 kg (167 lb)     Height 05/10/18 1832 1.702 m (5\' 7" )     Head Circumference --      Peak Flow --      Pain Score 05/10/18 1832 4     Pain Loc --      Pain Edu? --      Excl. in [place]? --     Constitutional: Alert and oriented. No acute distress. Pleasant and interactive Eyes: Conjunctivae are normal.  PERRLA, EOMI  Nose: No congestion/rhinnorhea. Mouth/Throat: Mucous membranes are moist.    Cardiovascular: Normal rate, regular rhythm. Grossly normal heart sounds.  Good peripheral circulation. Respiratory: Normal respiratory effort.  No retractions. Lungs CTAB. Gastrointestinal: Soft and nontender. No distention.  Genitourinary: deferred Musculoskeletal: Hip exam is normal, no pain with axial load, no rash, no swelling or erythema Neurologic:  Normal speech and language. No gross focal neurologic deficits are appreciated.  Skin:  Skin is warm, dry and intact.  Psychiatric: Mood and affect are normal. Speech  and behavior are normal.  ____________________________________________   LABS (all labs ordered are listed, but only abnormal results are displayed)  Labs Reviewed - No data to display ____________________________________________  EKG  None ____________________________________________  RADIOLOGY  X-ray negative for fracture CT head no acute stroke ____________________________________________   PROCEDURES  Procedure(s) performed: No  Procedures   Critical Care performed: No ____________________________________________   INITIAL IMPRESSION / ASSESSMENT AND PLAN / ED COURSE  Pertinent labs & imaging results that were available during my care of the patient were reviewed by me and considered in my medical decision making (see chart for details).  Overall well-appearing in no acute distress.  Right hip pain, possibly sciatica versus postherpetic neuralgia.  Will treat with analgesics, short course of low-dose  steroids.  We will have the patient follow-up with PCP for further evaluation    ____________________________________________   FINAL CLINICAL IMPRESSION(S) / ED DIAGNOSES  Final diagnoses:  Right hip pain        Note:  This document was prepared using Dragon voice recognition software and may include unintentional dictation errors.    Lavonia Drafts, MD 05/10/18 2001

## 2018-05-10 NOTE — ED Triage Notes (Signed)
Family also states they feel like the patient's speech is slurred today. Pt A&O x4. Answers all questions appropriately. Equal grips and strengths.

## 2018-05-10 NOTE — ED Notes (Signed)
Pt is out of room in ct

## 2018-05-10 NOTE — ED Triage Notes (Signed)
Pt c/o right hip pain since Saturday. Denies any injury.

## 2018-05-11 ENCOUNTER — Other Ambulatory Visit: Payer: Self-pay | Admitting: Unknown Physician Specialty

## 2018-05-12 ENCOUNTER — Encounter: Payer: Self-pay | Admitting: Unknown Physician Specialty

## 2018-05-12 ENCOUNTER — Ambulatory Visit (INDEPENDENT_AMBULATORY_CARE_PROVIDER_SITE_OTHER): Payer: Medicare Other | Admitting: Unknown Physician Specialty

## 2018-05-12 DIAGNOSIS — R42 Dizziness and giddiness: Secondary | ICD-10-CM

## 2018-05-12 DIAGNOSIS — R55 Syncope and collapse: Secondary | ICD-10-CM

## 2018-05-12 DIAGNOSIS — M545 Low back pain, unspecified: Secondary | ICD-10-CM | POA: Insufficient documentation

## 2018-05-12 DIAGNOSIS — I10 Essential (primary) hypertension: Secondary | ICD-10-CM | POA: Diagnosis not present

## 2018-05-12 DIAGNOSIS — G8929 Other chronic pain: Secondary | ICD-10-CM

## 2018-05-12 MED ORDER — BACLOFEN 10 MG PO TABS: 10.0000 mg | ORAL_TABLET | Freq: Three times a day (TID) | ORAL | 1 refills | Status: DC | PRN

## 2018-05-12 NOTE — Progress Notes (Signed)
BP 121/78   Pulse (!) 102   Temp 98.3 F (36.8 C) (Oral)   Ht 5' 7" (1.702 m)   Wt 169 lb 3.2 oz (76.7 kg)   LMP  (LMP Unknown)   SpO2 97%   BMI 26.50 kg/m    Subjective:    Patient ID: Tonya [name], female    DOB: Aug 25, 1946, 72 y.o.   MRN: [id number]  HPI: Tonya [name] is a 72 y.o. female  Chief Complaint  Patient presents with  . Hypertension    1 month f/up- amlodipine D/C at last visit   Hypertension Stopped Amlodipine last visit due to orthostatic changes.  She feels better.   Average home BPs Not checking  No problems or lightheadedness No chest pain with exertion or shortness of breath No Edema  Hip/back pain Presented to the ER for hip pain on 5/27.  It was determined ti was related to sciatica vs post-herpetic neuralgia.  Note reviewed.  X-rays reviewed and normal.  Today she states it is better and able to move.  She was given a shot and prednisone.  No leg pain  Relevant past medical, surgical, family and social history reviewed and updated as indicated. Interim medical history since our last visit reviewed. Allergies and medications reviewed and updated.  Review of Systems  Per HPI unless specifically indicated above     Objective:    BP 121/78   Pulse (!) 102   Temp 98.3 F (36.8 C) (Oral)   Ht 5' 7" (1.702 m)   Wt 169 lb 3.2 oz (76.7 kg)   LMP  (LMP Unknown)   SpO2 97%   BMI 26.50 kg/m   Wt Readings from Last 3 Encounters:  05/12/18 169 lb 3.2 oz (76.7 kg)  05/10/18 167 lb (75.8 kg)  04/14/18 166 lb 12.8 oz (75.7 kg)    Physical Exam  Constitutional: She is oriented to person, place, and time. She appears well-developed and well-nourished. No distress.  HENT:  Head: Normocephalic and atraumatic.  Eyes: Conjunctivae and lids are normal. Right eye exhibits no discharge. Left eye exhibits no discharge. No scleral icterus.  Neck: Normal range of motion. Neck supple. No JVD present. Carotid bruit is not present.  Cardiovascular: Normal  rate, regular rhythm and normal heart sounds.  Pulmonary/Chest: Effort normal and breath sounds normal.  Abdominal: Normal appearance. There is no splenomegaly or hepatomegaly.  Musculoskeletal:       Lumbar back: She exhibits decreased range of motion, swelling, edema and pain. She exhibits no deformity, no spasm and normal pulse.  Neurological: She is alert and oriented to person, place, and time.  Skin: Skin is warm, dry and intact. No rash noted. No pallor.  Psychiatric: She has a normal mood and affect. Her behavior is normal. Judgment and thought content normal.    Results for orders placed or performed in visit on 04/14/18  Comprehensive metabolic panel  Result Value Ref Range   Glucose 306 (H) 65 - 99 mg/dL   BUN 45 (H) 8 - 27 mg/dL   Creatinine, Ser 2.35 (H) 0.57 - 1.00 mg/dL   GFR calc non Af Amer 20 (L) >59 mL/min/1.73   GFR calc Af Amer 23 (L) >59 mL/min/1.73   BUN/Creatinine Ratio 19 12 - 28   Sodium 142 134 - 144 mmol/L   Potassium 4.9 3.5 - 5.2 mmol/L   Chloride 104 96 - 106 mmol/L   CO2 23 20 - 29 mmol/L   Calcium 9.1 8.7 -  10.3 mg/dL   Total Protein 6.0 6.0 - 8.5 g/dL   Albumin 3.8 3.5 - 4.8 g/dL   Globulin, Total 2.2 1.5 - 4.5 g/dL   Albumin/Globulin Ratio 1.7 1.2 - 2.2   Bilirubin Total <0.2 0.0 - 1.2 mg/dL   Alkaline Phosphatase 100 39 - 117 IU/L   AST 17 0 - 40 IU/L   ALT 11 0 - 32 IU/L  Rheumatoid Arthritis Profile  Result Value Ref Range   Rhuematoid fact SerPl-aCnc <10.0 0.0 - 48.2 IU/mL   Cyclic Citrullin Peptide Ab 5 0 - 19 units  ANA w/Reflex  Result Value Ref Range   Anit Nuclear Antibody(ANA) Negative Negative  C-reactive protein  Result Value Ref Range   CRP 1.1 0.0 - 4.9 mg/L  Sed Rate (ESR)  Result Value Ref Range   Sed Rate 14 0 - 40 mm/hr  CBC With Differential/Platelet  Result Value Ref Range   WBC 6.7 3.4 - 10.8 x10E3/uL   RBC 3.71 (L) 3.77 - 5.28 x10E6/uL   Hemoglobin 10.4 (L) 11.1 - 15.9 g/dL   Hematocrit 31.2 (L) 34.0 - 46.6 %     MCV 84 79 - 97 fL   MCH 28.0 26.6 - 33.0 pg   MCHC 33.3 31.5 - 35.7 g/dL   RDW 13.7 12.3 - 15.4 %   Platelets 258 150 - 379 x10E3/uL   Neutrophils 73 Not Estab. %   Lymphs 21 Not Estab. %   MID 6 Not Estab. %   Neutrophils Absolute 4.9 1.4 - 7.0 x10E3/uL   Lymphocytes Absolute 1.4 0.7 - 3.1 x10E3/uL   MID (Absolute) 0.4 0.1 - 1.6 X10E3/uL      Assessment & Plan:   Problem List Items Addressed This Visit      Unprioritized   Hypertension    Stable.  Hypotension resolved      Low back pain    Low back pain with radiation to right buttocks.  Recommended PT but pt not interested.  Will give exercises to help.  Rx for Baclofen to take prn      Relevant Medications   baclofen (LIORESAL) 10 MG tablet   RESOLVED: Postural dizziness with presyncope    Resolved with stopping Amlodipine         Seeing nephrology and Dr. [name].  Recheck in 6 months  Follow up plan: Return in about 6 months (around 11/12/2018).

## 2018-05-12 NOTE — Patient Instructions (Signed)

## 2018-05-12 NOTE — Assessment & Plan Note (Signed)
Stable.  Hypotension resolved

## 2018-05-12 NOTE — Assessment & Plan Note (Signed)
Resolved with stopping Amlodipine

## 2018-05-12 NOTE — Assessment & Plan Note (Addendum)
Low back pain with radiation to right buttocks.  Recommended PT but pt not interested.  Will give exercises to help.  Rx for Baclofen to take prn

## 2018-05-13 DIAGNOSIS — E1165 Type 2 diabetes mellitus with hyperglycemia: Secondary | ICD-10-CM | POA: Diagnosis not present

## 2018-05-13 DIAGNOSIS — Z794 Long term (current) use of insulin: Secondary | ICD-10-CM | POA: Diagnosis not present

## 2018-05-13 LAB — HEMOGLOBIN A1C: Hemoglobin A1C: 11

## 2018-05-13 NOTE — Telephone Encounter (Signed)
LOV  05/12/18 Kathrine Haddock Last refill 06/16/17 No provider  On medication

## 2018-05-20 DIAGNOSIS — E1122 Type 2 diabetes mellitus with diabetic chronic kidney disease: Secondary | ICD-10-CM | POA: Diagnosis not present

## 2018-05-20 DIAGNOSIS — E1121 Type 2 diabetes mellitus with diabetic nephropathy: Secondary | ICD-10-CM | POA: Diagnosis not present

## 2018-05-20 DIAGNOSIS — N183 Chronic kidney disease, stage 3 (moderate): Secondary | ICD-10-CM | POA: Diagnosis not present

## 2018-05-20 DIAGNOSIS — I10 Essential (primary) hypertension: Secondary | ICD-10-CM | POA: Diagnosis not present

## 2018-05-20 DIAGNOSIS — Z9114 Patient's other noncompliance with medication regimen: Secondary | ICD-10-CM | POA: Diagnosis not present

## 2018-05-20 DIAGNOSIS — E1165 Type 2 diabetes mellitus with hyperglycemia: Secondary | ICD-10-CM | POA: Diagnosis not present

## 2018-05-20 DIAGNOSIS — Z794 Long term (current) use of insulin: Secondary | ICD-10-CM | POA: Diagnosis not present

## 2018-05-20 DIAGNOSIS — E113393 Type 2 diabetes mellitus with moderate nonproliferative diabetic retinopathy without macular edema, bilateral: Secondary | ICD-10-CM | POA: Diagnosis not present

## 2018-05-23 ENCOUNTER — Other Ambulatory Visit: Payer: Self-pay | Admitting: Unknown Physician Specialty

## 2018-05-25 ENCOUNTER — Other Ambulatory Visit: Payer: Self-pay | Admitting: Unknown Physician Specialty

## 2018-05-31 ENCOUNTER — Ambulatory Visit: Payer: Medicare Other | Admitting: Dietician

## 2018-06-30 ENCOUNTER — Telehealth: Payer: Self-pay

## 2018-06-30 ENCOUNTER — Ambulatory Visit (INDEPENDENT_AMBULATORY_CARE_PROVIDER_SITE_OTHER): Payer: Medicare Other

## 2018-06-30 VITALS — BP 120/76 | HR 86 | Temp 98.4°F | Resp 16 | Ht 67.0 in | Wt 171.2 lb

## 2018-06-30 DIAGNOSIS — Z0000 Encounter for general adult medical examination without abnormal findings: Secondary | ICD-10-CM | POA: Diagnosis not present

## 2018-06-30 NOTE — Telephone Encounter (Signed)
Called patient to inform her she needed a follow up appt with cheryl wicker, np to discuss her swelling. Unable to leave message on either phone number provided

## 2018-06-30 NOTE — Telephone Encounter (Signed)
-----   Message from Kathrine Haddock, NP sent at 06/30/2018 12:37 PM EDT ----- Regarding: RE: swelling/ lasix refill? Yes, I think we need an appt to discuss.  Thanks ----- Message ----- From: Bevelyn Ngo, LPN Sent: 03/31/5300  10:54 AM To: Kathrine Haddock, NP, Lesle Chris, CMA Subject: swelling/ lasix refill?                        Saw patient for her AWV today, patient complained of swelling in bilateral ankles for the last month,  states she is not taking lasix currently- was told to stop it at last visit. She does not currently have this at home. She is not scheduled to follow up until December. Do you want patient to come in for follow up or refill lasix?

## 2018-06-30 NOTE — Patient Instructions (Addendum)
Tonya [name] , Thank you for taking time to come for your Medicare Wellness Visit. I appreciate your ongoing commitment to your health goals. Please review the following plan we discussed and let me know if I can assist you in the future.   Screening recommendations/referrals: Colonoscopy: cologuard due - declined  Mammogram: completed 03/11/2017  Bone Density: completed 03/11/2017 Recommended yearly ophthalmology/optometry visit for glaucoma screening and checkup Recommended yearly dental visit for hygiene and checkup  Vaccinations: Influenza vaccine: due 08/2018 Pneumococcal vaccine: completed  Tdap vaccine: up to date Shingles vaccine: shingrix eligible, check with your insurance company for coverage     Advanced directives: Advance directive discussed with you today. I have provided a copy for you to complete at home and have notarized. Once this is complete please bring a copy in to our office so we can scan it into your chart.  Conditions/risks identified: Recommend drinking at least 6-8 glasses of water a day   Next appointment: Follow up in one year for your annual wellness exam.    Preventive Care 70 Years and Older, Female Preventive care refers to lifestyle choices and visits with your health care provider that can promote health and wellness. What does preventive care include?  A yearly physical exam. This is also called an annual well check.  Dental exams once or twice a year.  Routine eye exams. Ask your health care provider how often you should have your eyes checked.  Personal lifestyle choices, including:  Daily care of your teeth and gums.  Regular physical activity.  Eating a healthy diet.  Avoiding tobacco and drug use.  Limiting alcohol use.  Practicing safe sex.  Taking low-dose aspirin every day.  Taking vitamin and mineral supplements as recommended by your health care provider. What happens during an annual well check? The services and screenings  done by your health care provider during your annual well check will depend on your age, overall health, lifestyle risk factors, and family history of disease. Counseling  Your health care provider may ask you questions about your:  Alcohol use.  Tobacco use.  Drug use.  Emotional well-being.  Home and relationship well-being.  Sexual activity.  Eating habits.  History of falls.  Memory and ability to understand (cognition).  Work and work Statistician.  Reproductive health. Screening  You may have the following tests or measurements:  Height, weight, and BMI.  Blood pressure.  Lipid and cholesterol levels. These may be checked every 5 years, or more frequently if you are over 70 years old.  Skin check.  Lung cancer screening. You may have this screening every year starting at age 70 if you have a 30-pack-year history of smoking and currently smoke or have quit within the past 15 years.  Fecal occult blood test (FOBT) of the stool. You may have this test every year starting at age 70.  Flexible sigmoidoscopy or colonoscopy. You may have a sigmoidoscopy every 5 years or a colonoscopy every 10 years starting at age 70.  Hepatitis C blood test.  Hepatitis B blood test.  Sexually transmitted disease (STD) testing.  Diabetes screening. This is done by checking your blood sugar (glucose) after you have not eaten for a while (fasting). You may have this done every 1-3 years.  Bone density scan. This is done to screen for osteoporosis. You may have this done starting at age 70.  Mammogram. This may be done every 1-2 years. Talk to your health care provider about how often you  should have regular mammograms. Talk with your health care provider about your test results, treatment options, and if necessary, the need for more tests. Vaccines  Your health care provider may recommend certain vaccines, such as:  Influenza vaccine. This is recommended every year.  Tetanus,  diphtheria, and acellular pertussis (Tdap, Td) vaccine. You may need a Td booster every 10 years.  Zoster vaccine. You may need this after age 70.  Pneumococcal 13-valent conjugate (PCV13) vaccine. One dose is recommended after age 70.  Pneumococcal polysaccharide (PPSV23) vaccine. One dose is recommended after age 65. Talk to your health care provider about which screenings and vaccines you need and how often you need them. This information is not intended to replace advice given to you by your health care provider. Make sure you discuss any questions you have with your health care provider. Document Released: 12/28/2015 Document Revised: 08/20/2016 Document Reviewed: 10/02/2015 Elsevier Interactive Patient Education  2017 [place] Prevention in the Home Falls can cause injuries. They can happen to people of all ages. There are many things you can do to make your home safe and to help prevent falls. What can I do on the outside of my home?  Regularly fix the edges of walkways and driveways and fix any cracks.  Remove anything that might make you trip as you walk through a door, such as a raised step or threshold.  Trim any bushes or trees on the path to your home.  Use bright outdoor lighting.  Clear any walking paths of anything that might make someone trip, such as rocks or tools.  Regularly check to see if handrails are loose or broken. Make sure that both sides of any steps have handrails.  Any raised decks and porches should have guardrails on the edges.  Have any leaves, snow, or ice cleared regularly.  Use sand or salt on walking paths during winter.  Clean up any spills in your garage right away. This includes oil or grease spills. What can I do in the bathroom?  Use night lights.  Install grab bars by the toilet and in the tub and shower. Do not use towel bars as grab bars.  Use non-skid mats or decals in the tub or shower.  If you need to sit down in  the shower, use a plastic, non-slip stool.  Keep the floor dry. Clean up any water that spills on the floor as soon as it happens.  Remove soap buildup in the tub or shower regularly.  Attach bath mats securely with double-sided non-slip rug tape.  Do not have throw rugs and other things on the floor that can make you trip. What can I do in the bedroom?  Use night lights.  Make sure that you have a light by your bed that is easy to reach.  Do not use any sheets or blankets that are too big for your bed. They should not hang down onto the floor.  Have a firm chair that has side arms. You can use this for support while you get dressed.  Do not have throw rugs and other things on the floor that can make you trip. What can I do in the kitchen?  Clean up any spills right away.  Avoid walking on wet floors.  Keep items that you use a lot in easy-to-reach places.  If you need to reach something above you, use a strong step stool that has a grab bar.  Keep electrical cords out  of the way.  Do not use floor polish or wax that makes floors slippery. If you must use wax, use non-skid floor wax.  Do not have throw rugs and other things on the floor that can make you trip. What can I do with my stairs?  Do not leave any items on the stairs.  Make sure that there are handrails on both sides of the stairs and use them. Fix handrails that are broken or loose. Make sure that handrails are as long as the stairways.  Check any carpeting to make sure that it is firmly attached to the stairs. Fix any carpet that is loose or worn.  Avoid having throw rugs at the top or bottom of the stairs. If you do have throw rugs, attach them to the floor with carpet tape.  Make sure that you have a light switch at the top of the stairs and the bottom of the stairs. If you do not have them, ask someone to add them for you. What else can I do to help prevent falls?  Wear shoes that:  Do not have high  heels.  Have rubber bottoms.  Are comfortable and fit you well.  Are closed at the toe. Do not wear sandals.  If you use a stepladder:  Make sure that it is fully opened. Do not climb a closed stepladder.  Make sure that both sides of the stepladder are locked into place.  Ask someone to hold it for you, if possible.  Clearly mark and make sure that you can see:  Any grab bars or handrails.  First and last steps.  Where the edge of each step is.  Use tools that help you move around (mobility aids) if they are needed. These include:  Canes.  Walkers.  Scooters.  Crutches.  Turn on the lights when you go into a dark area. Replace any light bulbs as soon as they burn out.  Set up your furniture so you have a clear path. Avoid moving your furniture around.  If any of your floors are uneven, fix them.  If there are any pets around you, be aware of where they are.  Review your medicines with your doctor. Some medicines can make you feel dizzy. This can increase your chance of falling. Ask your doctor what other things that you can do to help prevent falls. This information is not intended to replace advice given to you by your health care provider. Make sure you discuss any questions you have with your health care provider. Document Released: 09/27/2009 Document Revised: 05/08/2016 Document Reviewed: 01/05/2015 Elsevier Interactive Patient Education  2017 [employer].

## 2018-06-30 NOTE — Progress Notes (Signed)
Subjective:   Tonya [name] is a 72 y.o. female who presents for Medicare Annual (Subsequent) preventive examination.  Review of Systems:   Cardiac Risk Factors include: advanced age (>41men, >51 women);diabetes mellitus;dyslipidemia;hypertension     Objective:     Vitals: BP 120/76 (BP Location: Left Arm, Patient Position: Sitting)   Pulse 86   Temp 98.4 F (36.9 C) (Temporal)   Resp 16   Ht 5\' 7"  (1.702 m)   Wt 171 lb 3.2 oz (77.7 kg)   LMP  (LMP Unknown)   BMI 26.81 kg/m   Body mass index is 26.81 kg/m.  Advanced Directives 06/30/2018 09/22/2017 06/24/2017 06/16/2017 10/10/2016  Does Patient Have a Medical Advance Directive? No No No Yes No  Would patient like information on creating a medical advance directive? Yes (MAU/Ambulatory/Procedural Areas - Information given) - Yes (MAU/Ambulatory/Procedural Areas - Information given) - -    Tobacco Social History   Tobacco Use  Smoking Status Never Smoker  Smokeless Tobacco Never Used     Counseling given: Not Answered   Clinical Intake:  Pre-visit preparation completed: Yes  Pain : No/denies pain     Nutritional Status: BMI 25 -29 Overweight Nutritional Risks: None Diabetes: Yes CBG done?: No Did pt. bring in CBG monitor from home?: No  How often do you need to have someone help you when you read instructions, pamphlets, or other written materials from your doctor or pharmacy?: 1 - Never What is the last grade level you completed in school?: GED   Interpreter Needed?: No  Information entered by :: Tiffany HIll,LPN   Past Medical History:  Diagnosis Date  . CKD (chronic kidney disease), stage III ([place])   . Diabetes mellitus without complication ([place])   . Diastolic dysfunction    a. 05/2017 Echo: EF 55-60%, Gr1 DD, mildly dil LA.  [name] Hyperlipidemia   . Hypertension    Past Surgical History:  Procedure Laterality Date  . COLONOSCOPY  12/15/2006  . TUBAL LIGATION     Family History  Problem Relation  Age of Onset  . Diabetes Mother   . Stroke Mother   . Hyperlipidemia Father   . Hypertension Father   . Diabetes Sister   . Diabetes Brother   . Gout Son   . Diabetes Brother   . Kidney disease Son   . Breast cancer Neg Hx    Social History   Socioeconomic History  . Marital status: Married    Spouse name: Not on file  . Number of children: Not on file  . Years of education: Not on file  . Highest education level: GED or equivalent  Occupational History  . Not on file  Social Needs  . Financial resource strain: Not hard at all  . Food insecurity:    Worry: Never true    Inability: Never true  . Transportation needs:    Medical: No    Non-medical: No  Tobacco Use  . Smoking status: Never Smoker  . Smokeless tobacco: Never Used  Substance and Sexual Activity  . Alcohol use: No    Alcohol/week: 0.0 oz  . Drug use: No  . Sexual activity: Yes  Lifestyle  . Physical activity:    Days per week: 0 days    Minutes per session: 0 min  . Stress: Not at all  Relationships  . Social connections:    Talks on phone: More than three times a week    Gets together: More than three times a  week    Attends religious service: More than 4 times per year    Active member of club or organization: Yes    Attends meetings of clubs or organizations: More than 4 times per year    Relationship status: Married  Other Topics Concern  . Not on file  Social History Narrative  . Not on file    Outpatient Encounter Medications as of 06/30/2018  Medication Sig  . aspirin 81 MG tablet Take 81 mg by mouth daily.  . carvedilol (COREG) 6.25 MG tablet Take 1 tablet (6.25 mg total) by mouth 2 (two) times daily.  . Insulin Detemir (LEVEMIR FLEXPEN) 100 UNIT/ML Pen Inject 15 Units into the skin at bedtime.   [name] NOVOFINE 32G X 6 MM MISC USE AS DIRECTED THREE TIMES DAILY WITH HUMALOG  . olmesartan (BENICAR) 40 MG tablet TAKE 1 TABLET(40 MG) BY MOUTH DAILY  . omeprazole (PRILOSEC) 20 MG capsule TAKE 1  CAPSULE(20 MG) BY MOUTH TWICE DAILY  . ONE TOUCH ULTRA TEST test strip USE TO TEST BLOOD SUGAR THREE TIMES DAILY  . pioglitazone (ACTOS) 15 MG tablet Take 15 mg by mouth daily.  . rosuvastatin (CRESTOR) 5 MG tablet Take 1 tablet (5 mg total) by mouth daily.  [name] albuterol (PROVENTIL HFA;VENTOLIN HFA) 108 (90 Base) MCG/ACT inhaler Inhale 2 puffs into the lungs every 6 (six) hours as needed for wheezing or shortness of breath. (Patient not taking: Reported on 06/30/2018)  . baclofen (LIORESAL) 10 MG tablet Take 1 tablet (10 mg total) by mouth 3 (three) times daily as needed for muscle spasms. (Patient not taking: Reported on 06/30/2018)  . clopidogrel (PLAVIX) 75 MG tablet TK 1 T PO D  . furosemide (LASIX) 40 MG tablet Take 1 tablet (40 mg total) by mouth daily. (Patient not taking: Reported on 06/30/2018)  . HUMALOG KWIKPEN 100 UNIT/ML KiwkPen TK 12 UNITS BEFORE LUNCH AND 8 UNITS BEFORE SUPPER.   No facility-administered encounter medications on file as of 06/30/2018.     Activities of Daily Living In your present state of health, do you have any difficulty performing the following activities: 06/30/2018  Hearing? N  Vision? Y  Difficulty concentrating or making decisions? Y  Walking or climbing stairs? Y  Dressing or bathing? N  Doing errands, shopping? N  Preparing Food and eating ? N  Using the Toilet? N  In the past six months, have you accidently leaked urine? N  Do you have problems with loss of bowel control? N  Managing your Medications? N  Managing your Finances? N  Housekeeping or managing your Housekeeping? N  Some recent data might be hidden    Patient Care Team: Kathrine Haddock, NP as PCP - General (Nurse Practitioner) [name] Betsey Holiday, MD as Physician Assistant (Endocrinology) Lavonia Dana, MD as Consulting Physician (Nephrology) [name], MD as Consulting Physician (Cardiology)    Assessment:   This is a routine wellness examination for [place].  Exercise  Activities and Dietary recommendations Current Exercise Habits: The patient does not participate in regular exercise at present, Exercise limited by: None identified  Goals    . DIET - INCREASE WATER INTAKE     Recommend drinking at least 6-8 glasses of water a day        Fall Risk Fall Risk  06/30/2018 06/24/2017 10/10/2016 09/10/2016  Falls in the past year? No No No No   Is the patient's home free of loose throw rugs in walkways, pet beds, electrical cords, etc?  yes      Grab bars in the bathroom? yes      Handrails on the stairs?   no      Adequate lighting?   yes  Timed Get Up and Go performed: Completed in 8 seconds with no use of assistive devices, steady gait. No intervention needed at this time.   Depression Screen PHQ 2/9 Scores 06/30/2018 06/24/2017 10/10/2016 09/10/2016  PHQ - 2 Score 0 0 0 0  PHQ- 9 Score - - - 2     Cognitive Function     6CIT Screen 06/30/2018 06/24/2017  What Year? 0 points 0 points  What month? 0 points 0 points  What time? 0 points 0 points  Count back from 20 0 points 0 points  Months in reverse 0 points 0 points  Repeat phrase 2 points 0 points  Total Score 2 0    Immunization History  Administered Date(s) Administered  . Influenza, High Dose Seasonal PF 09/10/2016  . Influenza,inj,Quad PF,6+ Mos 09/18/2015  . Influenza-Unspecified 09/07/2017  . PPD Test 07/15/2016, 07/29/2016  . Pneumococcal Conjugate-13 06/07/2014  . Pneumococcal Polysaccharide-23 09/07/2013  . Td 06/12/2005  . Tdap 10/10/2016  . Zoster 09/13/2013    Qualifies for Shingles Vaccine? Yes, discussed shingrix vaccine   Screening Tests Health Maintenance  Topic Date Due  . Fecal DNA (Cologuard)  02/09/1998  . HEMOGLOBIN A1C  03/10/2017  . FOOT EXAM  09/10/2017  . OPHTHALMOLOGY EXAM  01/06/2018  . INFLUENZA VACCINE  07/15/2018  . MAMMOGRAM  03/12/2019  . TETANUS/TDAP  10/10/2026  . DEXA SCAN  Completed  . Hepatitis C Screening  Completed  . PNA vac Low Risk  Adult  Completed    Cancer Screenings: Lung: Low Dose CT Chest recommended if Age 61-80 years, 30 pack-year currently smoking OR have quit w/in 15years. Patient does not qualify. Breast:  Up to date on Mammogram? Yes  03/11/2017 Up to date of Bone Density/Dexa? Yes 03/11/2017 Colorectal: cologuard, declined   Additional Screenings:  Hepatitis C Screening: not indicated      Plan:    I have personally reviewed and addressed the Medicare Annual Wellness questionnaire and have noted the following in the patient's chart:  A. Medical and social history B. Use of alcohol, tobacco or illicit drugs  C. Current medications and supplements D. Functional ability and status E.  Nutritional status F.  Physical activity G. Advance directives H. List of other physicians I.  Hospitalizations, surgeries, and ER visits in previous 12 months J.  [place] such as hearing and vision if needed, cognitive and depression L. Referrals and appointments   In addition, I have reviewed and discussed with patient certain preventive protocols, quality metrics, and best practice recommendations. A written personalized care plan for preventive services as well as general preventive health recommendations were provided to patient.   Signed,  Tyler Aas, LPN Nurse Health Advisor   Nurse Notes: swelling in bilateral ankles for the last month,  states she is not taking lasix currently- was told to stop it at last visit.. She does not currently have a follow up until December 2019.   Discussed diabetic eye exam- she will make an appt with [facility] and have results faxed when completed   Due for diabetic foot exam- appt in December for 6 mo follow up

## 2018-08-19 DIAGNOSIS — N183 Chronic kidney disease, stage 3 (moderate): Secondary | ICD-10-CM | POA: Diagnosis not present

## 2018-08-19 DIAGNOSIS — E1122 Type 2 diabetes mellitus with diabetic chronic kidney disease: Secondary | ICD-10-CM | POA: Diagnosis not present

## 2018-08-19 DIAGNOSIS — Z794 Long term (current) use of insulin: Secondary | ICD-10-CM | POA: Diagnosis not present

## 2018-08-24 DIAGNOSIS — I10 Essential (primary) hypertension: Secondary | ICD-10-CM | POA: Diagnosis not present

## 2018-08-24 DIAGNOSIS — Z9114 Patient's other noncompliance with medication regimen: Secondary | ICD-10-CM | POA: Diagnosis not present

## 2018-08-24 DIAGNOSIS — E1122 Type 2 diabetes mellitus with diabetic chronic kidney disease: Secondary | ICD-10-CM | POA: Diagnosis not present

## 2018-08-24 DIAGNOSIS — E1121 Type 2 diabetes mellitus with diabetic nephropathy: Secondary | ICD-10-CM | POA: Diagnosis not present

## 2018-08-24 DIAGNOSIS — Z794 Long term (current) use of insulin: Secondary | ICD-10-CM | POA: Diagnosis not present

## 2018-08-24 DIAGNOSIS — N183 Chronic kidney disease, stage 3 (moderate): Secondary | ICD-10-CM | POA: Diagnosis not present

## 2018-08-24 DIAGNOSIS — E113393 Type 2 diabetes mellitus with moderate nonproliferative diabetic retinopathy without macular edema, bilateral: Secondary | ICD-10-CM | POA: Diagnosis not present

## 2018-08-24 DIAGNOSIS — E1165 Type 2 diabetes mellitus with hyperglycemia: Secondary | ICD-10-CM | POA: Diagnosis not present

## 2018-08-25 DIAGNOSIS — D631 Anemia in chronic kidney disease: Secondary | ICD-10-CM | POA: Diagnosis not present

## 2018-08-25 DIAGNOSIS — R809 Proteinuria, unspecified: Secondary | ICD-10-CM | POA: Diagnosis not present

## 2018-08-25 DIAGNOSIS — R609 Edema, unspecified: Secondary | ICD-10-CM | POA: Diagnosis not present

## 2018-08-25 DIAGNOSIS — N184 Chronic kidney disease, stage 4 (severe): Secondary | ICD-10-CM | POA: Diagnosis not present

## 2018-08-25 DIAGNOSIS — E1122 Type 2 diabetes mellitus with diabetic chronic kidney disease: Secondary | ICD-10-CM | POA: Diagnosis not present

## 2018-09-01 DIAGNOSIS — E119 Type 2 diabetes mellitus without complications: Secondary | ICD-10-CM | POA: Diagnosis not present

## 2018-09-01 LAB — HM DIABETES EYE EXAM

## 2018-09-07 DIAGNOSIS — E1165 Type 2 diabetes mellitus with hyperglycemia: Secondary | ICD-10-CM | POA: Diagnosis not present

## 2018-09-07 DIAGNOSIS — E1122 Type 2 diabetes mellitus with diabetic chronic kidney disease: Secondary | ICD-10-CM | POA: Diagnosis not present

## 2018-09-07 DIAGNOSIS — N183 Chronic kidney disease, stage 3 (moderate): Secondary | ICD-10-CM | POA: Diagnosis not present

## 2018-09-07 DIAGNOSIS — Z794 Long term (current) use of insulin: Secondary | ICD-10-CM | POA: Diagnosis not present

## 2018-09-07 DIAGNOSIS — E113393 Type 2 diabetes mellitus with moderate nonproliferative diabetic retinopathy without macular edema, bilateral: Secondary | ICD-10-CM | POA: Diagnosis not present

## 2018-09-07 DIAGNOSIS — E1121 Type 2 diabetes mellitus with diabetic nephropathy: Secondary | ICD-10-CM | POA: Diagnosis not present

## 2018-09-08 ENCOUNTER — Other Ambulatory Visit: Payer: Self-pay | Admitting: Unknown Physician Specialty

## 2018-09-25 ENCOUNTER — Other Ambulatory Visit: Payer: Self-pay | Admitting: Unknown Physician Specialty

## 2018-09-27 NOTE — Telephone Encounter (Signed)
Requested Prescriptions  Pending Prescriptions Disp Refills  . 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

## 2018-10-08 ENCOUNTER — Other Ambulatory Visit: Payer: Self-pay | Admitting: Cardiovascular Disease

## 2018-10-08 ENCOUNTER — Other Ambulatory Visit: Payer: Self-pay | Admitting: Unknown Physician Specialty

## 2018-10-08 ENCOUNTER — Other Ambulatory Visit: Payer: Self-pay | Admitting: Family Medicine

## 2018-10-08 NOTE — Telephone Encounter (Signed)
90 courtesy refill given, 0 refills Requested Prescriptions  Pending Prescriptions Disp Refills  . amitriptyline (ELAVIL) 25 MG tablet [Pharmacy Med Name: AMITRIPTYLINE 25MG  TABLETS] 90 tablet 0    Sig: TAKE 1 TABLET(25 MG) BY MOUTH AT BEDTIME     Psychiatry:  Antidepressants - Heterocyclics (TCAs) Passed - 10/08/2018  3:11 AM      Passed - Completed PHQ-2 or PHQ-9 in the last 360 days.      Passed - Valid encounter within last 6 months    Recent Outpatient Visits          4 months ago Postural dizziness with presyncope   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

## 2018-10-08 NOTE — Telephone Encounter (Signed)
2 month courtesy refill given until appt with Cannady.  0 refills Requested Prescriptions  Pending Prescriptions Disp Refills  . 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

## 2018-10-13 ENCOUNTER — Other Ambulatory Visit: Payer: Self-pay | Admitting: Unknown Physician Specialty

## 2018-10-13 NOTE — Telephone Encounter (Signed)
Requested medication (s) are due for refill today: Yes  Requested medication (s) are on the active medication list: Yes  Last refill:  10/09/17  Future visit scheduled: No  Notes to clinic:  Rx has expired and Emeterio Reeve is not listed on med list.    Requested Prescriptions  Pending Prescriptions Disp Refills   clopidogrel (PLAVIX) 75 MG tablet [Pharmacy Med Name: CLOPIDOGREL 75MG  TABLETS] 30 tablet 0    Sig: TAKE 1 TABLET BY MOUTH DAILY     Hematology: Antiplatelets - clopidogrel Failed - 10/13/2018  3:12 AM      Failed - Evaluate AST, ALT within 2 months of therapy initiation.      Failed - HCT in normal range and within 180 days    Hematocrit  Date Value Ref Range Status  04/14/2018 31.2 (L) 34.0 - 46.6 % Final         Failed - HGB in normal range and within 180 days    Hemoglobin  Date Value Ref Range Status  04/14/2018 10.4 (L) 11.1 - 15.9 g/dL Final         Failed - PLT in normal range and within 180 days    Platelets  Date Value Ref Range Status  04/14/2018 258 150 - 379 x10E3/uL Final         Passed - ALT in normal range and within 360 days    ALT  Date Value Ref Range Status  04/14/2018 11 0 - 32 IU/L Final         Passed - AST in normal range and within 360 days    AST  Date Value Ref Range Status  04/14/2018 17 0 - 40 IU/L Final         Passed - Valid encounter within last 6 months    Recent Outpatient Visits          5 months ago Postural dizziness with presyncope   [facility] Kathrine Haddock, NP   6 months ago [place] Kathrine Haddock, NP   9 months ago 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

## 2018-10-13 NOTE — Telephone Encounter (Signed)
Refill request approved at this time.

## 2018-10-14 ENCOUNTER — Telehealth: Payer: Self-pay | Admitting: Cardiovascular Disease

## 2018-10-14 ENCOUNTER — Other Ambulatory Visit: Payer: Self-pay | Admitting: *Deleted

## 2018-10-14 NOTE — Telephone Encounter (Signed)
Patient is scheduled for 11/05/18

## 2018-10-14 NOTE — Telephone Encounter (Signed)
-----   Message from [name], [place] sent at 10/11/2018  2:47 PM EDT ----- Please contact patient for a follow up appointment.  The patient was to follow up March 2019. Patient requesting refills.  Thank you, Tonya [name]

## 2018-10-19 IMAGING — DX DG CHEST 2V
2 series · 2 of 2 positions shown · non-contrast
Comparison: 03/13/2014.

CLINICAL DATA: Ankle edema.  Shortness of breath.

EXAM:
CHEST  2 VIEW

[chest lat]
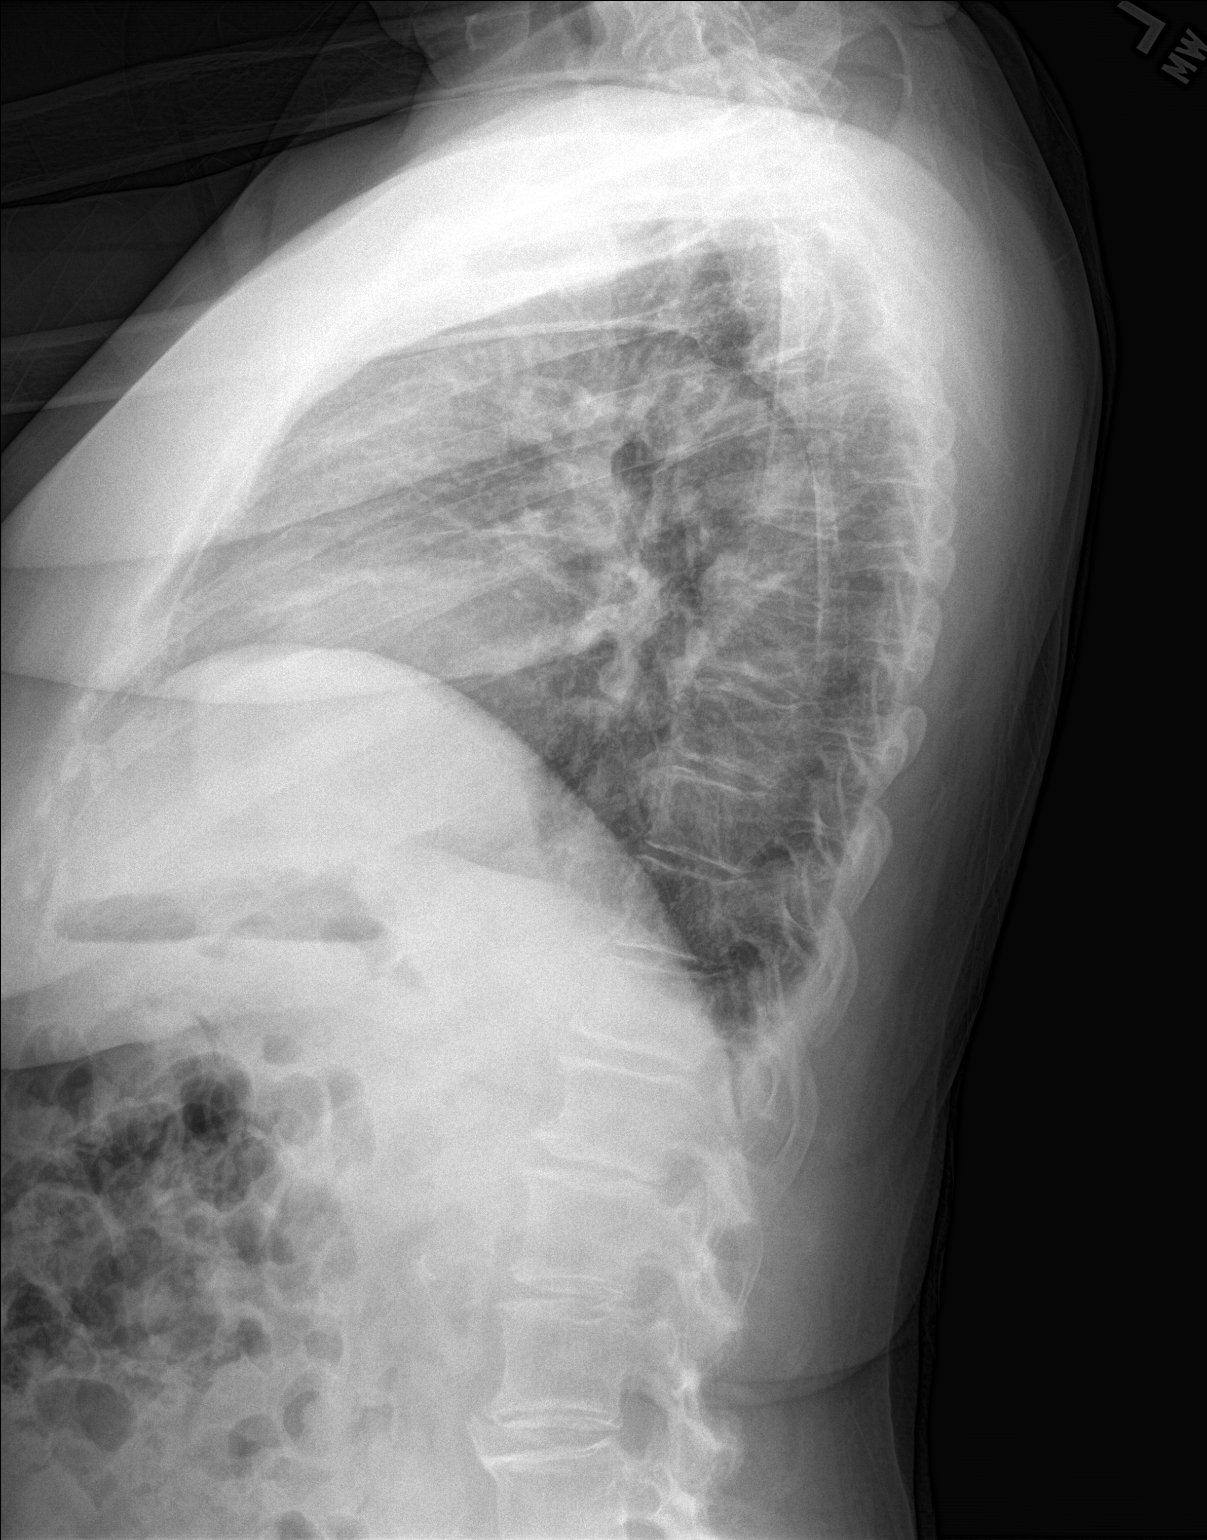

[chest pa]
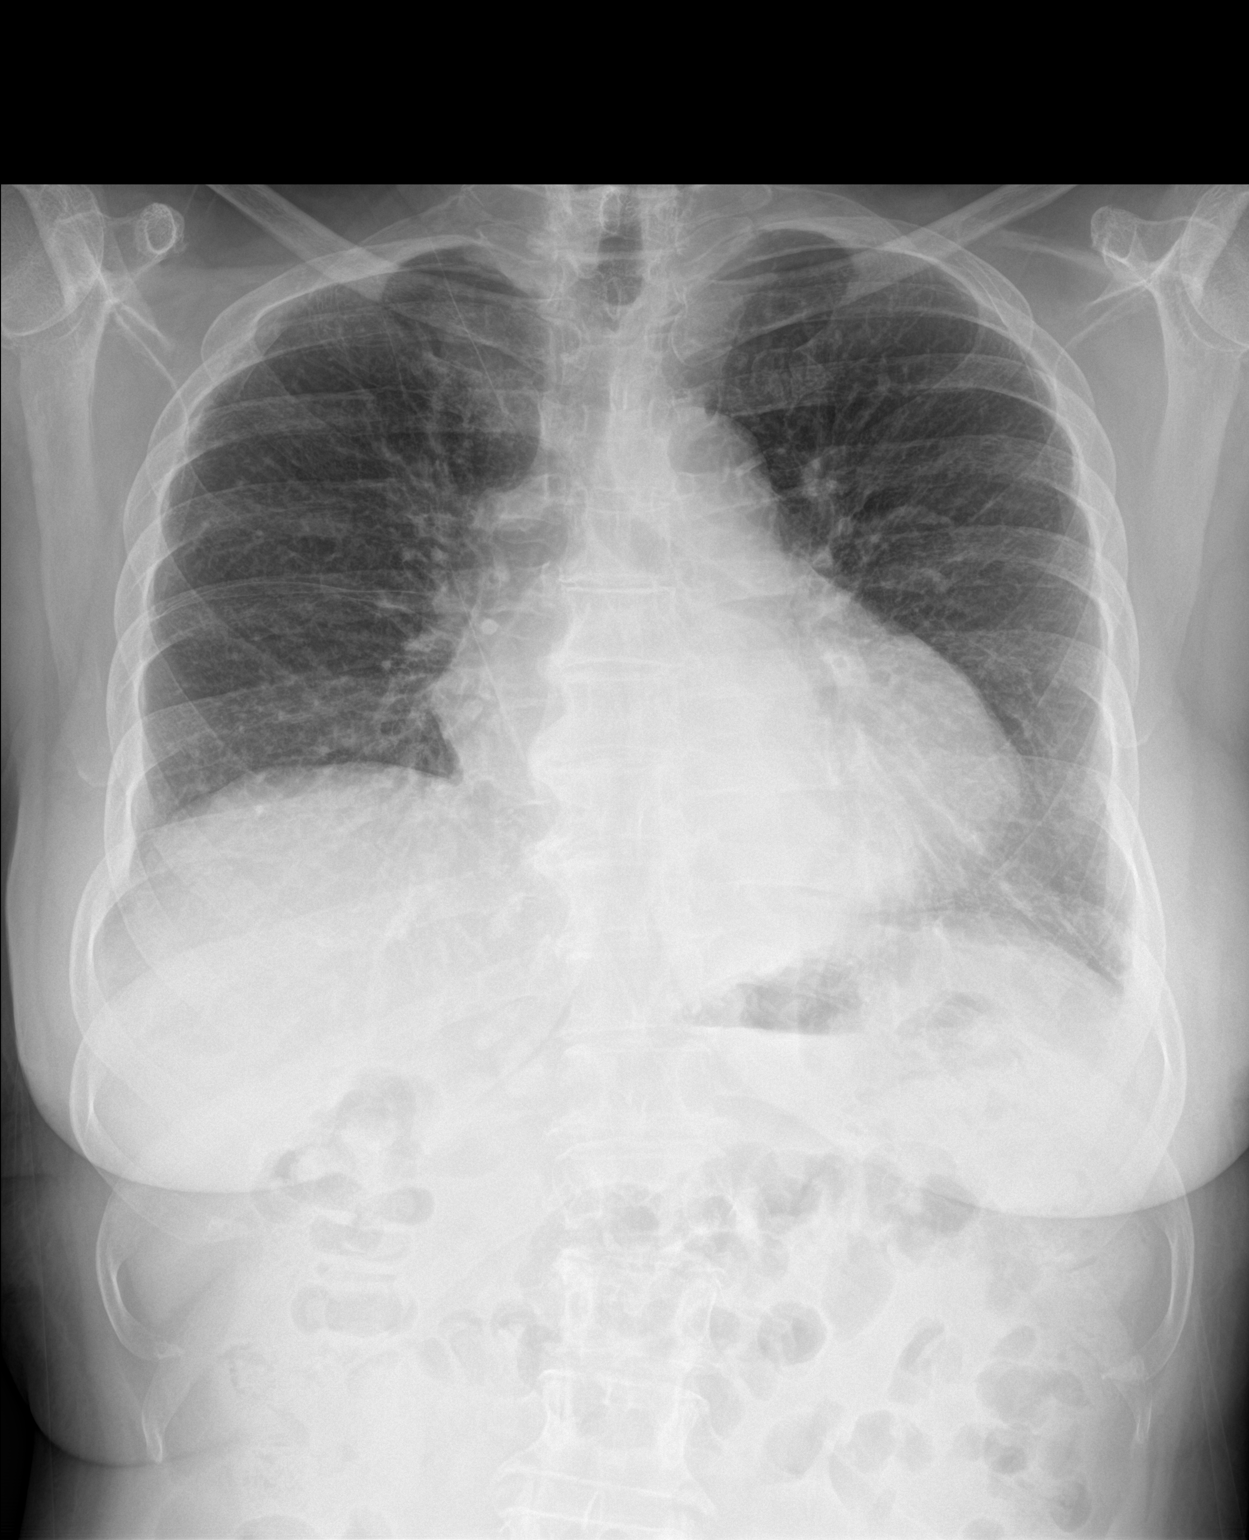

[2 of 2 positions shown; findings below may reference images not displayed]

FINDINGS: Cardiomegaly. Mild vascular congestion. No overt infiltrates or
failure. Chronic streaky LEFT lower lobe densities are stable. Trace
BILATERAL pleural effusions without pneumothorax. No osseous
findings.
IMPRESSION: Cardiomegaly. Mild vascular congestion. No overt infiltrates or
failure. Worsening aeration from priors.

## 2018-10-28 ENCOUNTER — Other Ambulatory Visit: Payer: Self-pay

## 2018-10-28 MED ORDER — ROSUVASTATIN CALCIUM 5 MG PO TABS: 5.0000 mg | ORAL_TABLET | Freq: Every day | ORAL | 0 refills | Status: DC

## 2018-11-05 ENCOUNTER — Ambulatory Visit (INDEPENDENT_AMBULATORY_CARE_PROVIDER_SITE_OTHER): Payer: Medicare Other | Admitting: Nurse Practitioner

## 2018-11-05 ENCOUNTER — Encounter: Payer: Self-pay | Admitting: Nurse Practitioner

## 2018-11-05 VITALS — BP 158/90 | HR 102 | Ht 67.0 in | Wt 169.0 lb

## 2018-11-05 DIAGNOSIS — E782 Mixed hyperlipidemia: Secondary | ICD-10-CM | POA: Diagnosis not present

## 2018-11-05 DIAGNOSIS — I10 Essential (primary) hypertension: Secondary | ICD-10-CM | POA: Diagnosis not present

## 2018-11-05 DIAGNOSIS — R0602 Shortness of breath: Secondary | ICD-10-CM | POA: Diagnosis not present

## 2018-11-05 MED ORDER — CARVEDILOL 3.125 MG PO TABS: 3.1250 mg | ORAL_TABLET | Freq: Two times a day (BID) | ORAL | 6 refills | Status: DC

## 2018-11-05 NOTE — Patient Instructions (Signed)
Medication Instructions:  - Your physician has recommended you make the following change in your medication:   1) START coreg (carvedilol) 3.125 mg- take 1 tablet by mouth twice daily  If you need a refill on your cardiac medications before your next appointment, please call your pharmacy.   Lab work: - none ordered  If you have labs (blood work) drawn today and your tests are completely normal, you will receive your results only by: [name] MyChart Message (if you have MyChart) OR . A paper copy in the mail If you have any lab test that is abnormal or we need to change your treatment, we will call you to review the results.  Testing/Procedures: - Your physician has requested that you have an echocardiogram. Echocardiography is a painless test that uses sound waves to create images of your heart. It provides your doctor with information about the size and shape of your heart and how well your heart's chambers and valves are working. This procedure takes approximately one hour. There are no restrictions for this procedure.  Follow-Up: At [facility], you and your health needs are our priority.  As part of our continuing mission to provide you with exceptional heart care, we have created designated Provider Care Teams.  These Care Teams include your primary Cardiologist (physician) and Advanced Practice Providers (APPs -  Physician Assistants and Nurse Practitioners) who all work together to provide you with the care you need, when you need it. . 2 months with Dr. [name] Situ  Any Other Special Instructions Will Be Listed Below (If Applicable). - N/A  Echocardiogram An echocardiogram, or echocardiography, uses sound waves (ultrasound) to produce an image of your heart. The echocardiogram is simple, painless, obtained within a short period of time, and offers valuable information to your health care provider. The images from an echocardiogram can provide information such as:  Evidence of coronary  artery disease (CAD).  Heart size.  Heart muscle function.  Heart valve function.  Aneurysm detection.  Evidence of a past heart attack.  Fluid buildup around the heart.  Heart muscle thickening.  Assess heart valve function.  Tell a health care provider about:  Any allergies you have.  All medicines you are taking, including vitamins, herbs, eye drops, creams, and over-the-counter medicines.  Any problems you or family members have had with anesthetic medicines.  Any blood disorders you have.  Any surgeries you have had.  Any medical conditions you have.  Whether you are pregnant or may be pregnant. What happens before the procedure? No special preparation is needed. Eat and drink normally. What happens during the procedure?  In order to produce an image of your heart, gel will be applied to your chest and a wand-like tool (transducer) will be moved over your chest. The gel will help transmit the sound waves from the transducer. The sound waves will harmlessly bounce off your heart to allow the heart images to be captured in real-time motion. These images will then be recorded.  You may need an IV to receive a medicine that improves the quality of the pictures. What happens after the procedure? You may return to your normal schedule including diet, activities, and medicines, unless your health care provider tells you otherwise. This information is not intended to replace advice given to you by your health care provider. Make sure you discuss any questions you have with your health care provider. Document Released: 11/28/2000 Document Revised: 07/19/2016 Document Reviewed: 08/08/2013 Elsevier Interactive Patient Education  2017 [employer]

## 2018-11-05 NOTE — Progress Notes (Signed)
Office Visit    Patient Name: Tonya [name] Date of Encounter: 11/05/2018  Primary Care Provider:  Kathrine Haddock, NP Primary Cardiologist:  Ida Rogue, MD  Chief Complaint    72 year old female with a history of diabetes, stage 3-4 chronic kidney disease, stroke, diastolic dysfunction, hypertension, and hyperlipidemia, who presents for follow-up of HFpEF.   Past Medical History    Past Medical History:  Diagnosis Date  . (HFpEF) heart failure with preserved ejection fraction ([place])    a. 05/2017 Echo: EF 55-60%, Gr1 DD, mildly dil LA.  . CKD (chronic kidney disease), stage III ([place])   . Diabetes mellitus without complication ([place])   . Hyperlipidemia   . Hypertension   . Stroke [facility])    a. 08/2017.   Past Surgical History:  Procedure Laterality Date  . COLONOSCOPY  12/15/2006  . TUBAL LIGATION      Allergies  Allergies  Allergen Reactions  . Atorvastatin     Myalgias   . Gabapentin Other (See Comments)    Speech impairment   . Pravastatin     Myalgias   . Trulicity [Dulaglutide] Hives    History of Present Illness    72 year old female with a history of diabetes, stage 3-4 chronic kidney disease, diastolic dysfunction, hypertension, and hyperlipidemia. She was last evaluated in our office in September of 2018 for HTN and cardiomegaly. At that time she was doing fairly well, but was having problems with hypertension. At that time her Clonidine was increased to 0.2mg  BID and she remained on Benicar and amlodipine. Her statin was also changed from atorvastatin to rosuvastatin due to myalgia in her thighs. She was instructed to follow up in 6 months but has not been evaluated in our office since. Unfortunately, 2 days after that visit, she was admitted to [facility] w/ slurred speech and CVA.  She says that she has not had any residual deficit but has had DOE ever since.  She was recently seen by her PCP in May, who stopped her Amlodipine due to orthostatic hypotension.  Per PCP notes orthostatic changes had resolved at follow up and BP remained stable off CCB.   Since out last visit she reports doing well but has noticed an increase in SOB and decreased activity tolerance. She is unsure of when it began to get worse but states it was not an issue last fall. She reports feeling SOB even with daily activities such as cooking or making her bed. She estimates that she can walk about 20-30 feet at a time before having to stop and catch her breath. She denies any noticeable increase in BLE edema or abdominal fullness. She does try to moderate her salt intake and primarily cooks at home. She doe admit to going out to eat about twice a week. She does not weigh her self daily but is compliant with her daily furosemide. On exam she is also noted to be tachycardic and hypertensive. She reports that her BP at home usually runs in the 417'E systolic. She could not recollect if she had been taking her Coreg and believed that she had refills waiting at the pharmacy for her. She did bring her medication bottles from home that did include her Benicar and furosemide. She states that her daughter helps her to organize her pill container each week.     Home Medications    Prior to Admission medications   Medication Sig Start Date End Date Taking? Authorizing Provider  albuterol (PROVENTIL HFA;VENTOLIN HFA)  108 (90 Base) MCG/ACT inhaler Inhale 2 puffs into the lungs every 6 (six) hours as needed for wheezing or shortness of breath. Patient not taking: Reported on 06/30/2018 12/18/17   Volney American, PA-C  amitriptyline (ELAVIL) 25 MG tablet TAKE 1 TABLET(25 MG) BY MOUTH AT BEDTIME 10/08/18   Kathrine Haddock, NP  aspirin 81 MG tablet Take 81 mg by mouth daily.    [provider]  baclofen (LIORESAL) 10 MG tablet Take 1 tablet (10 mg total) by mouth 3 (three) times daily as needed for muscle spasms. Patient not taking: Reported on 06/30/2018 05/12/18   Kathrine Haddock, NP    carvedilol (COREG) 6.25 MG tablet Take 1 tablet (6.25 mg total) by mouth 2 (two) times daily. 10/09/17 10/09/18  Kathrine Haddock, NP  clopidogrel (PLAVIX) 75 MG tablet TAKE 1 TABLET BY MOUTH DAILY 10/13/18   Cannady, Henrine Screws T, NP  furosemide (LASIX) 40 MG tablet Take 1 tablet (40 mg total) by mouth daily. Patient not taking: Reported on 06/30/2018 03/25/17   Kathrine Haddock, NP  HUMALOG KWIKPEN 100 UNIT/ML KiwkPen TK 12 UNITS BEFORE LUNCH AND 8 UNITS BEFORE SUPPER. 05/20/18   [provider]  NOVOFINE 32G X 6 MM MISC USE AS DIRECTED THREE TIMES DAILY WITH HUMALOG 09/08/18   Kathrine Haddock, NP  olmesartan (BENICAR) 40 MG tablet TAKE 1 TABLET(40 MG) BY MOUTH DAILY 09/08/18   Kathrine Haddock, NP  omeprazole (PRILOSEC) 20 MG capsule TAKE 1 CAPSULE(20 MG) BY MOUTH TWICE DAILY 09/27/18   Kathrine Haddock, NP  omeprazole (PRILOSEC) 20 MG capsule TAKE 1 CAPSULE(20 MG) BY MOUTH TWICE DAILY 10/08/18   [place], Megan P, DO  ONE TOUCH ULTRA TEST test strip USE TO TEST BLOOD SUGAR THREE TIMES DAILY 07/21/17   Kathrine Haddock, NP  pioglitazone (ACTOS) 15 MG tablet Take 15 mg by mouth daily. 08/25/17   [provider]  rosuvastatin (CRESTOR) 5 MG tablet Take 1 tablet (5 mg total) by mouth daily. 10/28/18 01/26/19  Theora Gianotti, NP    Review of Systems    She denies chest pain, palpitations, pnd, orthopnea, n, v, dizziness, syncope, edema, weight gain, or early satiety.  All other systems reviewed and are otherwise negative except as noted above.  Physical Exam    VS:  BP (!) 158/90 (BP Location: Left Arm, Patient Position: Sitting, Cuff Size: Normal)   Pulse (!) 102   Ht 5\' 7"  (1.702 m)   Wt 169 lb (76.7 kg)   LMP  (LMP Unknown)   BMI 26.47 kg/m  , BMI Body mass index is 26.47 kg/m. GEN: Well nourished, well developed, in no acute distress. HEENT: normal. Neck: Supple, no JVD, carotid bruits, or masses. Cardiac: RRR, no murmurs, rubs, or gallops. No clubbing, cyanosis. Trace BLE  edema.  Radials/PT 2+ and equal bilaterally.  Respiratory:  Respirations regular and unlabored, clear to auscultation bilaterally. GI: Soft, nontender, nondistended, BS + x 4. MS: no deformity or atrophy. Skin: warm and dry, no rash. Neuro:  Strength and sensation are intact. Psych: Normal affect.  Accessory Clinical Findings    ECG personally reviewed by me today - Sinus tachycardia, 101, LAD, possible LAE, lat TWI - no acute changes.  Assessment & Plan    1.  HFpEF/Dyspnea: Presents with new complaint of SOB on exertion. She has noticed a decrease in activity tolerance over last several months. She was unclear of when symptoms started, however in review of her records no mention of SOB can be found in recent  notes and was not an issue at her last OV here. She continues to take furosemide 40mg  daily. Has had intermittent edema in her BLE, and trace edema present on exam today but wt has been steady over the past 6 mos.  Last Echo in 05/2017 showed an EF 55-60%, Gr1 DD, mildly dil LA. Given new DOE will order repeat echo.  May need to consider ischemic eval, though no h/o chest pain.  2. Hypertension: BP elevated in office today along with tachycardia.  Per patient, BP at home normally runs in the 785'Y systolic. She says that she has a refill on her BP medication waiting for her at the pharmacy.  We called her pharmacy and she has not filled her coreg since last December.  The meds waiting for her @ the pharmacy are crestor and actos.  New orders were place and patient instructed to resume Coreg 3.125 mg BID. Cont ARB.  I feel that poor BP and HR control could also be contributing to her SOB.   3. Hyperlipidemia: Last LDL record was from 08/2017, LDL at that time was 146. Currently on Crestor 5mg . Was intolerant of Lipitor due to myalgias. Records indicated rx expired last week, confirmed with her pharmacy that she does have refills waiting to be picked up. Will check lipid panel at next Ov.    Follow up in 2 mos. LFT's in May 2019 wnl.   4. Diabetes II: Followed by endocrine at [facility]. Currently on Actos and insulin. Remains on ARB. Last A1c was 10.8.   5. CKD III: Remains on ARB. Diabetes followed by endocrine as above. Will continue BB and ARB for BP control. Per PCP patient is followed by Dr. Abigail Butts. BUN and Creatinine were 37/2.0 in September 2019.   6. Dispo: Will schedule echo and follow up in two mos. Will recheck Lipids at that time as well.    Murray Hodgkins, NP 11/05/2018, 4:50 PM

## 2018-11-16 ENCOUNTER — Ambulatory Visit: Payer: Medicare Other | Admitting: Unknown Physician Specialty

## 2018-11-16 ENCOUNTER — Ambulatory Visit (INDEPENDENT_AMBULATORY_CARE_PROVIDER_SITE_OTHER): Payer: Medicare Other | Admitting: Nurse Practitioner

## 2018-11-16 ENCOUNTER — Encounter: Payer: Self-pay | Admitting: Nurse Practitioner

## 2018-11-16 ENCOUNTER — Other Ambulatory Visit: Payer: Self-pay

## 2018-11-16 VITALS — BP 129/80 | HR 93 | Temp 97.4°F | Ht 67.0 in | Wt 169.0 lb

## 2018-11-16 DIAGNOSIS — E1165 Type 2 diabetes mellitus with hyperglycemia: Secondary | ICD-10-CM | POA: Diagnosis not present

## 2018-11-16 DIAGNOSIS — B0229 Other postherpetic nervous system involvement: Secondary | ICD-10-CM

## 2018-11-16 DIAGNOSIS — I10 Essential (primary) hypertension: Secondary | ICD-10-CM

## 2018-11-16 DIAGNOSIS — E1140 Type 2 diabetes mellitus with diabetic neuropathy, unspecified: Secondary | ICD-10-CM | POA: Diagnosis not present

## 2018-11-16 DIAGNOSIS — Z23 Encounter for immunization: Secondary | ICD-10-CM

## 2018-11-16 NOTE — Assessment & Plan Note (Signed)
Chronic, ongoing.  Continue current regimen at home.

## 2018-11-16 NOTE — Assessment & Plan Note (Signed)
BP 129/80 today.  Improved.  Continue to monitor and continue current med regimen.  Collaborate with [facility].

## 2018-11-16 NOTE — Assessment & Plan Note (Addendum)
Chronic, ongoing.  Most recent A1C 10.8.  Continue to collaborate with Dr. [name].  Encouraged her to carry snacks or glucose tablets with her when she is running errands.

## 2018-11-16 NOTE — Patient Instructions (Signed)
Hypoglycemia Hypoglycemia is when the sugar (glucose) level in the blood is too low. Symptoms of low blood sugar may include:  Feeling: ? Hungry. ? Worried or nervous (anxious). ? Sweaty and clammy. ? Confused. ? Dizzy. ? Sleepy. ? Sick to your stomach (nauseous).  Having: ? A fast heartbeat. ? A headache. ? A change in your vision. ? Jerky movements that you cannot control (seizure). ? Nightmares. ? Tingling or no feeling (numbness) around the mouth, lips, or tongue.  Having trouble with: ? Talking. ? Paying attention (concentrating). ? Moving (coordination). ? Sleeping.  Shaking.  Passing out (fainting).  Getting upset easily (irritability).  Low blood sugar can happen to people who have diabetes and people who do not have diabetes. Low blood sugar can happen quickly, and it can be an emergency. Treating Low Blood Sugar Low blood sugar is often treated by eating or drinking something sugary right away. If you can think clearly and swallow safely, follow the 15:15 rule:  Take 15 grams of a fast-acting carb (carbohydrate). Some fast-acting carbs are: ? 1 tube of glucose gel. ? 3 sugar tablets (glucose pills). ? 6-8 pieces of hard candy. ? 4 oz (120 mL) of fruit juice. ? 4 oz (120 mL) of regular (not diet) soda.  Check your blood sugar 15 minutes after you take the carb.  If your blood sugar is still at or below 70 mg/dL (3.9 mmol/L), take 15 grams of a carb again.  If your blood sugar does not go above 70 mg/dL (3.9 mmol/L) after 3 tries, get help right away.  After your blood sugar goes back to normal, eat a meal or a snack within 1 hour.  Treating Very Low Blood Sugar If your blood sugar is at or below 54 mg/dL (3 mmol/L), you have very low blood sugar (severe hypoglycemia). This is an emergency. Do not wait to see if the symptoms will go away. Get medical help right away. Call your local emergency services (911 in the [place] Do not drive yourself to the  hospital. If you have very low blood sugar and you cannot eat or drink, you may need a glucagon shot (injection). A family member or friend should learn how to check your blood sugar and how to give you a glucagon shot. Ask your doctor if you need to have a glucagon shot kit at home. Follow these instructions at home: General instructions  Avoid any diets that cause you to not eat enough food. Talk with your doctor before you start any new diet.  Take over-the-counter and prescription medicines only as told by your doctor.  Limit alcohol to no more than 1 drink per day for nonpregnant women and 2 drinks per day for men. One drink equals 12 oz of beer, 5 oz of wine, or 1 oz of hard liquor.  Keep all follow-up visits as told by your doctor. This is important. If You Have Diabetes:   Make sure you know the symptoms of low blood sugar.  Always keep a source of sugar with you, such as: ? Sugar. ? Sugar tablets. ? Glucose gel. ? Fruit juice. ? Regular soda (not diet soda). ? Milk. ? Hard candy. ? Honey.  Take your medicines as told.  Follow your exercise and meal plan. ? Eat on time. Do not skip meals. ? Follow your sick day plan when you cannot eat or drink normally. Make this plan ahead of time with your doctor.  Check your blood sugar as often   as told by your doctor. Always check before and after exercise.  Share your diabetes care plan with: ? Your work or school. ? People you live with.  Check your pee (urine) for ketones: ? When you are sick. ? As told by your doctor.  Carry a card or wear jewelry that says you have diabetes. If You Have Low Blood Sugar From Other Causes:   Check your blood sugar as often as told by your doctor.  Follow instructions from your doctor about what you cannot eat or drink. Contact a doctor if:  You have trouble keeping your blood sugar in your target range.  You have low blood sugar often. Get help right away if:  You still have  symptoms after you eat or drink something sugary.  Your blood sugar is at or below 54 mg/dL (3 mmol/L).  You have jerky movements that you cannot control.  You pass out. These symptoms may be an emergency. Do not wait to see if the symptoms will go away. Get medical help right away. Call your local emergency services (911 in the [place] Do not drive yourself to the hospital. This information is not intended to replace advice given to you by your health care provider. Make sure you discuss any questions you have with your health care provider. Document Released: 02/25/2010 Document Revised: 05/08/2016 Document Reviewed: 01/04/2016 Elsevier Interactive Patient Education  [employer].

## 2018-11-16 NOTE — Progress Notes (Signed)
BP 129/80   Pulse 93   Temp (!) 97.4 F (36.3 C) (Oral)   Ht 5\' 7"  (1.702 m)   Wt 169 lb (76.7 kg)   LMP  (LMP Unknown)   SpO2 100%   BMI 26.47 kg/m    Subjective:    Patient ID: Tonya [name], female    DOB: 10-13-1946, 72 y.o.   MRN: [id number]  HPI: Tonya [name] is a 72 y.o. female presents for 6 months follow-up  Chief Complaint  Patient presents with  . Hypertension    f/u  . Back Pain   HYPERTENSION She reports she picked up Coreg from pharmacy and has been taking.  Was concern at recent [facility] visit she had not been taking Coreg, which she states she had not been doing.  During visit she pulled medications out of purse and Coreg was in purse, she reports she has been taking.  Has h/o stroke in September 2018 and is followed by [facility] since this time.  They are performing a repeat echo on the 16th of this month.  Sees Dr. Abigail Butts this month for CKD follow-up.   Hypertension status: controlled  Satisfied with current treatment? yes Duration of hypertension: months BP monitoring frequency:  a few times a week BP range: 124/80 to 140/80 varies throughout day BP medication side effects:  no Medication compliance: fair compliance Aspirin: yes Recurrent headaches: no Visual changes: no Palpitations: no Dyspnea: no Chest pain: no Lower extremity edema: no Dizzy/lightheaded: no   DIABETES Sees Dr. [name], last seen 09/07/18.  She has upcoming appointment this December.  Recent A1C at the visit was 10.8.  Taking Levemir 12 units and Humalog 8 units before meals.  Hypoglycemic episodes: yes, occasional episodes at home reported where she requires snacks.  Encouraged her to carry snacks with her when she is out running errands as well. Polydipsia/polyuria: no Visual disturbance: no Chest pain: no Paresthesias: no Glucose Monitoring: yes  Accucheck frequency: Daily  Fasting glucose: 139 (yesterday morning was 244) -- it varies  Post  prandial:  Evening:  Before meals: Taking Insulin?: yes  Long acting insulin: Levemir  Short acting insulin: Humalog Blood Pressure Monitoring: a few times a week Retinal Examination: Up to Date Foot Exam: Up to Date Pneumovax: Up to Date Influenza: Up to Date Aspirin: yes   BACK PAIN:  Has been present for years for post herpetic neuralgia.  Currently reports she takes medication at night and has no issue with pain.    Relevant past medical, surgical, family and social history reviewed and updated as indicated. Interim medical history since our last visit reviewed. Allergies and medications reviewed and updated.  Review of Systems  Constitutional: Negative for activity change, appetite change, fatigue, fever and unexpected weight change.  Respiratory: Negative for cough, chest tightness and shortness of breath.   Cardiovascular: Negative for chest pain, palpitations and leg swelling.  Gastrointestinal: Negative for abdominal distention, abdominal pain, constipation, diarrhea, nausea and vomiting.  Endocrine: Negative.   Musculoskeletal: Negative.   Skin: Negative.   Neurological: Negative for dizziness, numbness and headaches.  Psychiatric/Behavioral: Negative.     Per HPI unless specifically indicated above     Objective:    BP 129/80   Pulse 93   Temp (!) 97.4 F (36.3 C) (Oral)   Ht 5\' 7"  (1.702 m)   Wt 169 lb (76.7 kg)   LMP  (LMP Unknown)   SpO2 100%   BMI 26.47 kg/m   Wt Readings  from Last 3 Encounters:  11/16/18 169 lb (76.7 kg)  11/05/18 169 lb (76.7 kg)  06/30/18 171 lb 3.2 oz (77.7 kg)    Physical Exam  Constitutional: She is oriented to person, place, and time. She appears well-developed and well-nourished.  HENT:  Head: Normocephalic.  Eyes: Pupils are equal, round, and reactive to light. Conjunctivae and EOM are normal. Right eye exhibits no discharge. Left eye exhibits no discharge.  Neck: Normal range of motion. Neck supple. No JVD present.  Carotid bruit is not present. No thyromegaly present.  Cardiovascular: Normal rate, regular rhythm and normal heart sounds.  Pulmonary/Chest: Effort normal and breath sounds normal.  Abdominal: Soft. Bowel sounds are normal.  Musculoskeletal: She exhibits edema.  Trace edema BLE  Lymphadenopathy:    She has no cervical adenopathy.  Neurological: She is alert and oriented to person, place, and time.  Skin: Skin is warm and dry.  Psychiatric: She has a normal mood and affect. Her behavior is normal. Judgment and thought content normal.  Nursing note and vitals reviewed.  Diabetic Foot Exam - Simple   Simple Foot Form Visual Inspection No deformities, no ulcerations, no other skin breakdown bilaterally:  Yes Sensation Testing See comments:  Yes Pulse Check See comments:  Yes Comments 1+ pulses bilateral feet.  Slight decreased sensation both feet R 5/10 and L 6/10     Results for orders placed or performed in visit on 07/16/18  Hemoglobin A1c  Result Value Ref Range   Hemoglobin A1C 11.0       Assessment & Plan:   Problem List Items Addressed This Visit      Cardiovascular and Mediastinum   Hypertension    BP 129/80 today.  Improved.  Continue to monitor and continue current med regimen.  Collaborate with [facility].      Relevant Medications   rosuvastatin (CRESTOR) 5 MG tablet     Endocrine   Poorly controlled type 2 diabetes mellitus with neuropathy (HCC)    Chronic, ongoing.  Most recent A1C 10.8.  Continue to collaborate with Dr. [name].  Encouraged her to carry snacks or glucose tablets with her when she is running errands.      Relevant Medications   insulin detemir (LEVEMIR) 100 UNIT/ML injection   rosuvastatin (CRESTOR) 5 MG tablet     Nervous and Auditory   Post herpetic neuralgia    Chronic, ongoing.  Continue current regimen at home.       Other Visit Diagnoses    Flu vaccine need    -  Primary   Relevant Orders   Flu vaccine HIGH DOSE PF  (Completed)       Follow up plan: Return in about 3 months (around 02/15/2019) for HTN, HLD, T2DM.

## 2018-11-19 ENCOUNTER — Other Ambulatory Visit: Payer: Self-pay | Admitting: Nurse Practitioner

## 2018-11-19 NOTE — Telephone Encounter (Signed)
Pt has f/u appointment 02/15/19 filled for 90 days

## 2018-11-24 DIAGNOSIS — N2581 Secondary hyperparathyroidism of renal origin: Secondary | ICD-10-CM | POA: Diagnosis not present

## 2018-11-24 DIAGNOSIS — R809 Proteinuria, unspecified: Secondary | ICD-10-CM | POA: Diagnosis not present

## 2018-11-24 DIAGNOSIS — E1122 Type 2 diabetes mellitus with diabetic chronic kidney disease: Secondary | ICD-10-CM | POA: Diagnosis not present

## 2018-11-24 DIAGNOSIS — D631 Anemia in chronic kidney disease: Secondary | ICD-10-CM | POA: Diagnosis not present

## 2018-11-24 DIAGNOSIS — N183 Chronic kidney disease, stage 3 (moderate): Secondary | ICD-10-CM | POA: Diagnosis not present

## 2018-11-24 DIAGNOSIS — I10 Essential (primary) hypertension: Secondary | ICD-10-CM | POA: Diagnosis not present

## 2018-11-30 ENCOUNTER — Ambulatory Visit (INDEPENDENT_AMBULATORY_CARE_PROVIDER_SITE_OTHER): Payer: Medicare Other

## 2018-11-30 ENCOUNTER — Other Ambulatory Visit: Payer: Self-pay

## 2018-11-30 DIAGNOSIS — R0602 Shortness of breath: Secondary | ICD-10-CM

## 2018-12-01 ENCOUNTER — Telehealth: Payer: Self-pay

## 2018-12-01 NOTE — Telephone Encounter (Signed)
-----   Message from Theora Gianotti, NP sent at 12/01/2018  8:41 AM EST ----- Normal heart squeezing fxn.  Heart mildly stiff - likely 2/2 HTN, so good BP control remains important.  No significant valvular dzs.  She should be weighing daily and watching NaCl intake closely.  F/u w/ Dr. [name] Situ in Jan as planned.

## 2018-12-01 NOTE — Telephone Encounter (Signed)
Attempted to call patient. LMTCB 12/18

## 2018-12-02 ENCOUNTER — Other Ambulatory Visit: Payer: Self-pay | Admitting: Unknown Physician Specialty

## 2018-12-02 ENCOUNTER — Other Ambulatory Visit: Payer: Self-pay | Admitting: *Deleted

## 2018-12-02 MED ORDER — ROSUVASTATIN CALCIUM 5 MG PO TABS: 5.0000 mg | ORAL_TABLET | Freq: Every day | ORAL | 2 refills | Status: DC

## 2018-12-02 NOTE — Telephone Encounter (Signed)
Results called to pt. Pt verbalized understanding of results and plan of care.  

## 2018-12-02 NOTE — Telephone Encounter (Signed)
Refill sent.

## 2018-12-02 NOTE — Telephone Encounter (Signed)
*  STAT* If patient is at the pharmacy, call can be transferred to refill team.   1. Which medications need to be refilled? (please list name of each medication and dose if known) Rosuvastatin 5 mg, Olmesartan 40 mg  2. Which pharmacy/location (including street and city if local pharmacy) is medication to be sent to?[facility]  3. Do they need a 30 day or 90 day supply? 90 day supply

## 2018-12-10 DIAGNOSIS — I10 Essential (primary) hypertension: Secondary | ICD-10-CM | POA: Diagnosis not present

## 2018-12-10 DIAGNOSIS — E1121 Type 2 diabetes mellitus with diabetic nephropathy: Secondary | ICD-10-CM | POA: Diagnosis not present

## 2018-12-10 DIAGNOSIS — N183 Chronic kidney disease, stage 3 (moderate): Secondary | ICD-10-CM | POA: Diagnosis not present

## 2018-12-10 DIAGNOSIS — E1169 Type 2 diabetes mellitus with other specified complication: Secondary | ICD-10-CM | POA: Diagnosis not present

## 2018-12-10 DIAGNOSIS — Z794 Long term (current) use of insulin: Secondary | ICD-10-CM | POA: Diagnosis not present

## 2018-12-10 DIAGNOSIS — E785 Hyperlipidemia, unspecified: Secondary | ICD-10-CM | POA: Diagnosis not present

## 2018-12-10 DIAGNOSIS — E1165 Type 2 diabetes mellitus with hyperglycemia: Secondary | ICD-10-CM | POA: Diagnosis not present

## 2018-12-10 DIAGNOSIS — E113393 Type 2 diabetes mellitus with moderate nonproliferative diabetic retinopathy without macular edema, bilateral: Secondary | ICD-10-CM | POA: Diagnosis not present

## 2018-12-10 DIAGNOSIS — E1122 Type 2 diabetes mellitus with diabetic chronic kidney disease: Secondary | ICD-10-CM | POA: Diagnosis not present

## 2018-12-13 ENCOUNTER — Other Ambulatory Visit: Payer: Self-pay | Admitting: Nurse Practitioner

## 2018-12-13 NOTE — Telephone Encounter (Signed)
Copied from [place] [id number]. Topic: General - Other >> Dec 13, 2018  1:56 PM Lennox Solders wrote: Reason for CRM: pt needs new rxs send to  [facility] mail order. Pt needs pioglitazone, furosemide, rosuvastatin, clopidogrel , omeprazole and carvedilol #90 w/refills send to [facility] mail order pharm

## 2018-12-14 MED ORDER — PIOGLITAZONE HCL 15 MG PO TABS: 15.0000 mg | ORAL_TABLET | Freq: Every day | ORAL | 11 refills | Status: DC

## 2018-12-14 MED ORDER — CLOPIDOGREL BISULFATE 75 MG PO TABS: 75.0000 mg | ORAL_TABLET | Freq: Every day | ORAL | 0 refills | Status: DC

## 2018-12-14 MED ORDER — FUROSEMIDE 40 MG PO TABS: 40.0000 mg | ORAL_TABLET | Freq: Every day | ORAL | 0 refills | Status: DC

## 2018-12-14 MED ORDER — OMEPRAZOLE 20 MG PO CPDR: DELAYED_RELEASE_CAPSULE | ORAL | 0 refills | Status: DC

## 2018-12-14 NOTE — Telephone Encounter (Signed)
Patient called and advised to call the cardiology office who prescribed Carvedilol and Rosuvastatin to have them send to [facility], she verbalized understanding.

## 2018-12-14 NOTE — Telephone Encounter (Signed)
Requested medication (s) are due for refill today: Yes  Requested medication (s) are on the active medication list: Yes  Last refill:  Furosemide 03/25/17; Actos 08/25/17 historical provider  Future visit scheduled: Yes  Notes to clinic:  Expired, unable to refill per protocol     Requested Prescriptions  Pending Prescriptions Disp Refills   pioglitazone (ACTOS) 15 MG tablet 30 tablet 11    Sig: Take 1 tablet (15 mg total) by mouth daily.     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     Cardiovascular:  Diuretics - Loop Failed - 12/14/2018  9:43 AM      Failed - Cr in normal range and within 360 days    Creatinine, Ser  Date Value Ref Range Status  04/14/2018 2.35 (H) 0.57 - 1.00 mg/dL Final         Passed - K in normal range and within 360 days    Potassium  Date Value Ref Range Status  04/14/2018 4.9 3.5 - 5.2 mmol/L Final         Passed - Ca in normal range and within 360 days   Calcium  Date Value Ref Range Status  04/14/2018 9.1 8.7 - 10.3 mg/dL Final         Passed - Na in normal range and within 360 days    Sodium  Date Value Ref Range Status  04/14/2018 142 134 - 144 mmol/L Final         Passed - Last BP in normal range    BP Readings from Last 1 Encounters:  11/16/18 129/80         Passed - Valid encounter within last 6 months    Recent Outpatient Visits          4 weeks ago Flu vaccine need   [facility] [place], [place] T, NP   7 months ago Postural dizziness with presyncope   [facility] Kathrine Haddock, NP   8 months ago [place], NP   11 months ago 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     Hematology: Antiplatelets - clopidogrel Failed - 12/14/2018  9:43 AM      Failed - Evaluate AST, ALT within 2 months of therapy initiation.      Failed - HCT in normal range and within 180 days    Hematocrit  Date Value Ref Range Status  04/14/2018 31.2 (L) 34.0 - 46.6 % Final         Failed - HGB in normal range and within 180 days    Hemoglobin  Date Value Ref Range Status  04/14/2018 10.4 (L) 11.1 - 15.9 g/dL Final         Failed - PLT in normal range and within 180 days    Platelets  Date Value Ref Range Status  04/14/2018 258 150 - 379 x10E3/uL Final         Passed - ALT in normal range and within 360 days    ALT  Date Value Ref Range Status  04/14/2018 11 0 - 32 IU/L Final         Passed - AST in  normal range and within 360 days    AST  Date Value Ref Range Status  04/14/2018 17 0 - 40 IU/L Final         Passed - Valid encounter within last 6 months    Recent Outpatient Visits          4 weeks ago Flu vaccine need   [employer], [name] T, NP   7 months ago Postural dizziness with presyncope   [facility] Kathrine Haddock, NP   8 months ago [place] Kathrine Haddock, NP   11 months ago 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   [facility] Kathrine Haddock, NP   12 months ago Upper respiratory tract infection, unspecified type   [facility], Lilia Argue, PA-C      Future Appointments            In 2 months Cannady, Henrine Screws T, NP [employer]   In 6 months  [place], [facility]          omeprazole (PRILOSEC) 20 MG capsule 180 capsule 0    Sig: TAKE 1 CAPSULE(20 MG) BY MOUTH TWICE DAILY     Gastroenterology: Proton Pump Inhibitors Passed - 12/14/2018  9:43 AM      Passed - Valid encounter within last 12 months    Recent Outpatient Visits          4 weeks ago Flu vaccine need   [employer], [name] T, NP   7 months ago Postural dizziness with presyncope   [facility] Kathrine Haddock, NP   8 months ago [place], Cheryl, NP   11 months ago 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

## 2018-12-26 NOTE — Progress Notes (Signed)
Cardiology Office Note  Date:  12/27/2018   ID:  Tonya [name], DOB 28-Aug-1946, MRN [id number]  PCP:  [name], NP   Chief Complaint  Patient presents with  . other    2 month follow up and discuss Echo results. Meds reviewed by the pt. verbally. Pt. c/o shortness of breath at times.     HPI:  73 yo woman with past medical hx of  DM, HBA1C 13 down to 11.8 Chronic Leg edema HTN Hyperlipidemia Chronic kidney disease, followed by nephrology Presenting for f/u of her cardiomyopathy, leg swelling  Most recent lab work reviewed HBA1C 11.7 Followed by endocrinology  Total cholesterol 2 years ago 254 with LDL 160 Reports that she is compliant with her Crestor 5 mg  Increased SOB on last visit Had echocardiogram that showed normal LV function  Echo  12./17/2019 results reviewed with her The cavity size was normal. Wall thickness was   increased in a pattern of mild LVH. Systolic function was normal.   The estimated ejection fraction was in the range of 55% to 60%.   Doppler parameters are consistent with abnormal left ventricular   relaxation (grade 1 diastolic dysfunction).  Reports that she is tired  EKG personally reviewed by myself on todays visit Shows normal sinus rhythm with rate 73 bpm T wave abnormality 1 and aVL Previously noted T waves anterolateral leads have resolved   PMH:   has a past medical history of (HFpEF) heart failure with preserved ejection fraction ([place]), CKD (chronic kidney disease), stage III ([place]), Diabetes mellitus without complication ([place]), Hyperlipidemia, Hypertension, and Stroke ([place]).  PSH:    Past Surgical History:  Procedure Laterality Date  . COLONOSCOPY  12/15/2006  . TUBAL LIGATION      Current Outpatient Medications  Medication Sig Dispense Refill  . aspirin 81 MG tablet Take 81 mg by mouth daily.    . carvedilol (COREG) 3.125 MG tablet Take 1 tablet (3.125 mg total) by mouth 2 (two) times daily with a meal. 60 tablet 6   . clopidogrel (PLAVIX) 75 MG tablet Take 1 tablet (75 mg total) by mouth daily. 90 tablet 0  . furosemide (LASIX) 40 MG tablet Take 1 tablet (40 mg total) by mouth daily. 30 tablet 0  . HUMALOG KWIKPEN 100 UNIT/ML KiwkPen TK 12 UNITS BEFORE LUNCH AND 8 UNITS BEFORE SUPPER.  11  . insulin detemir (LEVEMIR) 100 UNIT/ML injection Inject into the skin daily.    [name] NOVOFINE 32G X 6 MM MISC USE AS DIRECTED THREE TIMES DAILY WITH HUMALOG 100 each 0  . olmesartan (BENICAR) 40 MG tablet TAKE 1 TABLET(40 MG) BY MOUTH DAILY 90 tablet 1  . omeprazole (PRILOSEC) 20 MG capsule TAKE 1 CAPSULE(20 MG) BY MOUTH TWICE DAILY 180 capsule 0  . ONE TOUCH ULTRA TEST test strip USE TO TEST BLOOD SUGAR THREE TIMES DAILY 100 each 12  . pioglitazone (ACTOS) 15 MG tablet Take 1 tablet (15 mg total) by mouth daily. 30 tablet 11  . rosuvastatin (CRESTOR) 5 MG tablet Take 1 tablet (5 mg total) by mouth daily. 90 tablet 2   No current facility-administered medications for this visit.      Allergies:   Atorvastatin; Gabapentin; Pravastatin; and Trulicity [dulaglutide]   Social History:  The patient  reports that she has never smoked. She has never used smokeless tobacco. She reports that she does not drink alcohol or use drugs.   Family History:   family history includes Diabetes in her  brother, brother, mother, and sister; Gout in her son; Hyperlipidemia in her father; Hypertension in her father; Kidney disease in her son; Stroke in her mother.    Review of Systems: Review of Systems  Constitutional: Positive for malaise/fatigue.  Respiratory: Negative.   Cardiovascular: Negative.   Gastrointestinal: Negative.   Musculoskeletal: Negative.   Neurological: Negative.   Psychiatric/Behavioral: Negative.   All other systems reviewed and are negative.    PHYSICAL EXAM: VS:  BP 140/88 (BP Location: Left Arm, Patient Position: Sitting, Cuff Size: Normal)   Pulse 79   Ht 5\' 7"  (1.702 m)   Wt 174 lb 8 oz (79.2 kg)    LMP  (LMP Unknown)   BMI 27.33 kg/m  , BMI Body mass index is 27.33 kg/m. Constitutional:  oriented to person, place, and time. No distress.  HENT:  Head: Grossly normal Eyes:  no discharge. No scleral icterus.  Neck: No JVD, no carotid bruits  Cardiovascular: Regular rate and rhythm, no murmurs appreciated Pulmonary/Chest: Clear to auscultation bilaterally, no wheezes or rails Abdominal: Soft.  no distension.  no tenderness.  Musculoskeletal: Normal range of motion Neurological:  normal muscle tone. Coordination normal. No atrophy Skin: Skin warm and dry Psychiatric: normal affect, pleasant   Recent Labs: 04/14/2018: ALT 11; BUN 45; Creatinine, Ser 2.35; Hemoglobin 10.4; Platelets 258; Potassium 4.9; Sodium 142    Lipid Panel Lab Results  Component Value Date   CHOL 239 (H) 09/10/2016   HDL 58 09/10/2016   LDLCALC 134 (H) 09/10/2016   TRIG 236 (H) 09/10/2016      Wt Readings from Last 3 Encounters:  12/27/18 174 lb 8 oz (79.2 kg)  11/16/18 169 lb (76.7 kg)  11/05/18 169 lb (76.7 kg)     ASSESSMENT AND PLAN:  Fatigue Likely a benign finding, Discussed anginal symptoms with her She otherwise has relatively good exercise tolerance Recommended regular walking program If symptoms get worse we would order stress test.  Details of stress test discussed with her  Shortness of breath She reported having shortness of breath on last clinic visit, echocardiogram reviewed this was normal We did discuss stress testing if symptoms get worse, she has declined at this time    Poorly controlled type 2 diabetes mellitus with neuropathy ([place]) Discussed diet with her, medication compliance A1c markedly elevated placing her at higher risk also with high cholesterol Recommended close follow-up with endocrinology  Chronic kidney disease, stage 4, severely decreased GFR (HCC)  kidney disease likely secondary to long-standing diabetes, hypertension  Rising creatinine over the past year  now 2.0  Mixed hyperlipidemia  On low-dose Crestor no recent lipid panel available Order placed for liver and lipids She prefers to have this done in the hospital  Hypertension Blood pressure borderline elevated, recommend she monitor pressures at home   Total encounter time more than 25 minutes  Greater than 50% was spent in counseling and coordination of care with the patient   Disposition:   F/U 12 mo   Orders Placed This Encounter  Procedures  . EKG 12-Lead     Signed, Esmond Plants, M.D., Ph.D. 12/27/2018  [place], [place]

## 2018-12-27 ENCOUNTER — Ambulatory Visit (INDEPENDENT_AMBULATORY_CARE_PROVIDER_SITE_OTHER): Payer: Medicare Other | Admitting: Cardiovascular Disease

## 2018-12-27 ENCOUNTER — Encounter: Payer: Self-pay | Admitting: Cardiovascular Disease

## 2018-12-27 VITALS — BP 140/88 | HR 79 | Ht 67.0 in | Wt 174.5 lb

## 2018-12-27 DIAGNOSIS — E782 Mixed hyperlipidemia: Secondary | ICD-10-CM

## 2018-12-27 DIAGNOSIS — I129 Hypertensive chronic kidney disease with stage 1 through stage 4 chronic kidney disease, or unspecified chronic kidney disease: Secondary | ICD-10-CM

## 2018-12-27 DIAGNOSIS — Z794 Long term (current) use of insulin: Secondary | ICD-10-CM | POA: Diagnosis not present

## 2018-12-27 DIAGNOSIS — M7989 Other specified soft tissue disorders: Secondary | ICD-10-CM

## 2018-12-27 DIAGNOSIS — N184 Chronic kidney disease, stage 4 (severe): Secondary | ICD-10-CM | POA: Diagnosis not present

## 2018-12-27 DIAGNOSIS — R0602 Shortness of breath: Secondary | ICD-10-CM

## 2018-12-27 DIAGNOSIS — E1122 Type 2 diabetes mellitus with diabetic chronic kidney disease: Secondary | ICD-10-CM | POA: Diagnosis not present

## 2018-12-27 DIAGNOSIS — N183 Chronic kidney disease, stage 3 (moderate): Secondary | ICD-10-CM | POA: Diagnosis not present

## 2018-12-27 NOTE — Patient Instructions (Signed)
Medication Instructions:  No changes  If you need a refill on your cardiac medications before your next appointment, please call your pharmacy.    Lab work: We will check lipids, LFTS, HBA1C today   If you have labs (blood work) drawn today and your tests are completely normal, you will receive your results only by: [name] MyChart Message (if you have MyChart) OR . A paper copy in the mail If you have any lab test that is abnormal or we need to change your treatment, we will call you to review the results.   Testing/Procedures: No new testing needed   Follow-Up: At [facility], you and your health needs are our priority.  As part of our continuing mission to provide you with exceptional heart care, we have created designated Provider Care Teams.  These Care Teams include your primary Cardiologist (physician) and Advanced Practice Providers (APPs -  Physician Assistants and Nurse Practitioners) who all work together to provide you with the care you need, when you need it.  . You will need a follow up appointment in 12 months .   Please call our office 2 months in advance to schedule this appointment.    . Providers on your designated Care Team:   . Murray Hodgkins, NP . Christell Faith, PA-C . Marrianne Mood, PA-C  Any Other Special Instructions Will Be Listed Below (If Applicable).  For educational health videos Log in to : [email or web address] Or : [email or web address], password : [email or web address]

## 2018-12-30 ENCOUNTER — Other Ambulatory Visit
Admission: RE | Admit: 2018-12-30 | Discharge: 2018-12-30 | Disposition: A | Discharge status: Home / Self Care | Payer: Medicare Other | Source: Ambulatory Visit | Attending: Cardiovascular Disease | Admitting: Cardiovascular Disease

## 2018-12-30 DIAGNOSIS — E782 Mixed hyperlipidemia: Secondary | ICD-10-CM | POA: Insufficient documentation

## 2018-12-30 LAB — HEPATIC FUNCTION PANEL
ALBUMIN: 3.3 g/dL — AB (ref 3.5–5.0)
ALT: 14 U/L (ref 0–44)
AST: 22 U/L (ref 15–41)
Alkaline Phosphatase: 93 U/L (ref 38–126)
Bilirubin, Direct: <0.1 mg/dL (ref 0.0–0.2)
Total Bilirubin: 0.7 mg/dL (ref 0.3–1.2)
Total Protein: 6.2 g/dL — ABNORMAL LOW (ref 6.5–8.1)

## 2018-12-30 LAB — LIPID PANEL
CHOL/HDL RATIO: 2 ratio
Cholesterol: 159 mg/dL (ref 0–200)
HDL: 79 mg/dL (ref >40)
LDL Cholesterol: 68 mg/dL (ref 0–99)
Triglycerides: 62 mg/dL (ref <150)
VLDL: 12 mg/dL (ref 0–40)

## 2018-12-31 ENCOUNTER — Telehealth: Payer: Self-pay

## 2018-12-31 ENCOUNTER — Other Ambulatory Visit: Payer: Self-pay | Admitting: Nurse Practitioner

## 2018-12-31 LAB — LDL CHOLESTEROL, DIRECT: Direct LDL: 67 mg/dL (ref 0–99)

## 2018-12-31 MED ORDER — FUROSEMIDE 40 MG PO TABS: 40.0000 mg | ORAL_TABLET | Freq: Every day | ORAL | 3 refills | Status: DC

## 2018-12-31 NOTE — Telephone Encounter (Signed)
furosemide

## 2019-01-03 ENCOUNTER — Other Ambulatory Visit: Payer: Self-pay | Admitting: Unknown Physician Specialty

## 2019-01-03 ENCOUNTER — Telehealth: Payer: Self-pay

## 2019-01-03 NOTE — Telephone Encounter (Signed)
Call to patient to give lab results. Pt verbalized understanding and has no further questions at this time.  Advised pt to call for any further questions or concerns

## 2019-01-03 NOTE — Telephone Encounter (Signed)
-----   Message from [name], MD sent at 01/02/2019 12:58 PM EST ----- cholesterol excellent Continue current meds

## 2019-01-12 ENCOUNTER — Other Ambulatory Visit: Payer: Self-pay | Admitting: Unknown Physician Specialty

## 2019-01-12 NOTE — Telephone Encounter (Signed)
Requested medication (s) are due for refill today -yes  Requested medication (s) are on the active medication list -yes  Future visit scheduled -yes  Last refill: 09/08/18  Notes to clinic: Patient is requesting a refill of supplies listed in [facility] name. Please refill in new PCP name  Requested Prescriptions  Pending Prescriptions Disp Refills   NOVOFINE 32G X 6 MM MISC [Pharmacy Med Name: NOVOFINE 32GX6MM PEN NEEDLES 100'S] 100 each 0    Sig: USE AS DIRECTED THREE TIMES DAILY WITH HUMALOG     Endocrinology: Diabetes - Testing Supplies Passed - 01/12/2019  2:06 PM      Passed - Valid encounter within last 12 months    Recent Outpatient Visits          1 month ago Flu vaccine need   [facility] [place], [place] T, NP   8 months ago Postural dizziness with presyncope   [facility] Kathrine Haddock, NP   9 months ago [place] Kathrine Haddock, NP   12 months ago 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   [facility] Kathrine Haddock, NP   1 year ago Upper respiratory tract infection, unspecified type   [facility], Lilia Argue, [place]      Future Appointments            In 1 month Cannady, [name], NP [employer], [place]   In 5 months  [employer], [place]            Requested Prescriptions  Pending Prescriptions Disp Refills   NOVOFINE 32G X 6 MM [place] [Pharmacy Med Name: NOVOFINE 32GX6MM PEN NEEDLES 100'S] 100 each 0    Sig: USE AS DIRECTED [place]     Endocrinology: Diabetes - Testing Supplies Passed - 01/12/2019  2:06 PM      Passed - Valid encounter within last 12 months    Recent Outpatient Visits          1 month ago Flu vaccine need   [facility] [place], [place] T, NP   8 months ago Postural dizziness with presyncope   [facility] Kathrine Haddock, NP   9 months ago [place] Kathrine Haddock, NP   12 months ago 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

## 2019-01-18 ENCOUNTER — Other Ambulatory Visit: Payer: Self-pay | Admitting: Unknown Physician Specialty

## 2019-02-10 ENCOUNTER — Encounter: Payer: Self-pay | Admitting: Nurse Practitioner

## 2019-02-15 ENCOUNTER — Ambulatory Visit (INDEPENDENT_AMBULATORY_CARE_PROVIDER_SITE_OTHER): Payer: Medicare Other | Admitting: Nurse Practitioner

## 2019-02-15 ENCOUNTER — Encounter: Payer: Self-pay | Admitting: Nurse Practitioner

## 2019-02-15 VITALS — BP 138/78 | HR 77 | Temp 97.5°F | Ht 67.0 in | Wt 171.4 lb

## 2019-02-15 DIAGNOSIS — E785 Hyperlipidemia, unspecified: Secondary | ICD-10-CM

## 2019-02-15 DIAGNOSIS — E1140 Type 2 diabetes mellitus with diabetic neuropathy, unspecified: Secondary | ICD-10-CM

## 2019-02-15 DIAGNOSIS — E1165 Type 2 diabetes mellitus with hyperglycemia: Secondary | ICD-10-CM

## 2019-02-15 DIAGNOSIS — I10 Essential (primary) hypertension: Secondary | ICD-10-CM

## 2019-02-15 DIAGNOSIS — E1169 Type 2 diabetes mellitus with other specified complication: Secondary | ICD-10-CM | POA: Diagnosis not present

## 2019-02-15 NOTE — Assessment & Plan Note (Addendum)
Chronic, stable. Continue on current medication regimen. CBC and anemia panel today d/t increase fatigue/SOB. Continue to collaborate with [facility].

## 2019-02-15 NOTE — Progress Notes (Addendum)
BP 138/78 (BP Location: Left Arm, Patient Position: Sitting, Cuff Size: Normal)   Pulse 77   Temp (!) 97.5 F (36.4 C)   Ht 5\' 7"  (1.702 m)   Wt 171 lb 6 oz (77.7 kg)   LMP  (LMP Unknown)   SpO2 99%   BMI 26.84 kg/m    Subjective:    Patient ID: Tonya [name], female    DOB: 23-Jun-1946, 73 y.o.   MRN: [id number]  HPI: Tonya [name] is a 73 y.o. female  Chief Complaint  Patient presents with  . Hyperlipidemia  . Hypertension  . Diabetes   DIABETES Reports taking Humalog 8u BID before meals (breakfast and dinner), insulin detemir (Levemir) 12u qHS "most nights" & pioglitazone (Actos) daily. Reports occasionally taking a third dose of Humalog midday if CBG is elevated, however reports "just snacking" with no full meal at this time. Followed by Dr. [name] with endocrinology- most recent note reviewed. A1C 11.7% (12/10/2018). Endorses episodes of hypoglycemia nearly every day. Reports a fall (without injury) last week during an episode of hypoglycemia. She did not take her blood sugar at that time, but reports eating resolved her symptoms. Encouraged to eat small frequent meals throughout the day and to eat a full meal when taking Humalog. Chronic care team involved in care.  Hypoglycemic episodes:Yes-  Polydipsia/polyuria: no Visual disturbance: no Chest pain: no Paresthesias: yes- intermittently in her feet bilaterally Glucose Monitoring: yes  Accucheck frequency: "mostly twice a day in the morning and when I feel I need to check it"  Fasting glucose: with no levemir HS, 180's-200 upon awakening; with levemir HS, 70's-80's upon awakening.  Taking Insulin?: yes  Long acting insulin: Levemir - 12u qHS  Short acting insulin: Humalog 8u BID with breakfast and supper.  Retinal Examination: Up to Date Foot Exam: done today   HYPERTENSION / HYPERLIPIDEMIA Pt currently taking carvedilol (Coreg) and olmesartan (Benicar) for HTN and rosuvastatin (Crestor) for HLD. Followed by Dr.  [name] Situ with cardiology- most recent note reviewed.  Satisfied with current treatment? yes Duration of hypertension: chronic BP monitoring frequency: weekly BP range: 120's-140/70-80's BP medication side effects: no Past BP meds:unknown Duration of hyperlipidemia: chronic Cholesterol medication side effects: no Cholesterol supplements: none Past cholesterol medications:unknown Medication compliance: good compliance Aspirin: yes Recent stressors: no Recurrent headaches: no Visual changes: no Palpitations: yes - pt reports increased fatigue, SOB and palpitations with limited activity (ie: making the bed). She denies worsening of symptoms Dyspnea: yes Chest pain: no Lower extremity edema: yes- minimal in the evening Dizzy/lightheaded: yes- associated with low blood sugar.   Relevant past medical, surgical, family and social history reviewed and updated as indicated. Interim medical history since our last visit reviewed. Allergies and medications reviewed and updated.  Review of Systems  Per HPI unless specifically indicated above     Objective:    BP 138/78 (BP Location: Left Arm, Patient Position: Sitting, Cuff Size: Normal)   Pulse 77   Temp (!) 97.5 F (36.4 C)   Ht 5\' 7"  (1.702 m)   Wt 171 lb 6 oz (77.7 kg)   LMP  (LMP Unknown)   SpO2 99%   BMI 26.84 kg/m   Wt Readings from Last 3 Encounters:  02/15/19 171 lb 6 oz (77.7 kg)  12/27/18 174 lb 8 oz (79.2 kg)  11/16/18 169 lb (76.7 kg)    Physical Exam Vitals signs and nursing note reviewed.  Constitutional:      Appearance: She is well-developed.  HENT:     Head: Normocephalic.  Eyes:     General:        Right eye: No discharge.        Left eye: No discharge.     Conjunctiva/sclera: Conjunctivae normal.     Pupils: Pupils are equal, round, and reactive to light.  Neck:     Musculoskeletal: Normal range of motion and neck supple.     Thyroid: No thyromegaly.     Vascular: No carotid bruit or JVD.    Cardiovascular:     Rate and Rhythm: Normal rate and regular rhythm.     Heart sounds: Normal heart sounds. No murmur. No gallop.   Pulmonary:     Effort: Pulmonary effort is normal.     Breath sounds: Normal breath sounds.  Abdominal:     General: Bowel sounds are normal.     Palpations: Abdomen is soft.  Musculoskeletal:     Right lower leg: No edema.     Left lower leg: No edema.  Lymphadenopathy:     Cervical: No cervical adenopathy.  Skin:    General: Skin is warm and dry.  Neurological:     Mental Status: She is alert and oriented to person, place, and time.  Psychiatric:        Mood and Affect: Mood normal.        Behavior: Behavior normal.        Thought Content: Thought content normal.        Judgment: Judgment normal.    Diabetic Foot Exam - Simple   Simple Foot Form Diabetic Foot exam was performed with the following findings:  Yes 02/15/2019 11:31 AM  Visual Inspection No deformities, no ulcerations, no other skin breakdown bilaterally:  Yes Sensation Testing See comments:  Yes Pulse Check Posterior Tibialis and Dorsalis pulse intact bilaterally:  Yes Comments Right foot sensation intact. Left foot sensation intact with the exception of the lateral surface of the ball of the foot.      Results for orders placed or performed during the hospital encounter of 12/30/18  Lipid panel  Result Value Ref Range   Cholesterol 159 0 - 200 mg/dL   Triglycerides 62 <150 mg/dL   HDL 79 >40 mg/dL   Total CHOL/HDL Ratio 2.0 RATIO   VLDL 12 0 - 40 mg/dL   LDL Cholesterol 68 0 - 99 mg/dL  Hepatic function panel  Result Value Ref Range   Total Protein 6.2 (L) 6.5 - 8.1 g/dL   Albumin 3.3 (L) 3.5 - 5.0 g/dL   AST 22 15 - 41 U/L   ALT 14 0 - 44 U/L   Alkaline Phosphatase 93 38 - 126 U/L   Total Bilirubin 0.7 0.3 - 1.2 mg/dL   Bilirubin, Direct <0.1 0.0 - 0.2 mg/dL   Indirect Bilirubin NOT CALCULATED 0.3 - 0.9 mg/dL  LDL cholesterol, direct  Result Value Ref Range    Direct LDL 67 0 - 99 mg/dL      Assessment & Plan:   Problem List Items Addressed This Visit      Cardiovascular and Mediastinum   Hypertension    Chronic, stable. Continue on current medication regimen. CBC and anemia panel today d/t increase fatigue/SOB. Continue to collaborate with [facility].       Relevant Orders   CBC with Differential/Platelet   Anemia panel     Endocrine   Poorly controlled type 2 diabetes mellitus with neuropathy ([place]) - Primary    Chronic,  ongoing. Most recent A1C 11.7%. Referral to chronic care management and alerted pharmacist to patient needs. Collaborate with Dr. [name] to prevent further episodes of hypoglycemia. Recommended avoiding missed meals or frequent snacks throughout the day. CMP today.        Relevant Orders   Comprehensive metabolic panel   Ambulatory referral to Chronic Care Management Services   Hyperlipidemia associated with type 2 diabetes mellitus ([place])    Labs reviewed from 12/30/2018. LDL 68. Continue rosuvastatin. [facility] to [facility] with [facility].            Follow up plan: Return in about 3 months (around 05/18/2019) for DM/HTN/HLD.   NOTE WRITTEN BY [employer] DNP STUDENT.  ASSESSMENT AND PLAN OF CARE REVIEWED WITH STUDENT, AGREE WITH ABOVE FINDINGS AND PLAN.

## 2019-02-15 NOTE — Assessment & Plan Note (Signed)
Labs reviewed from 12/30/2018. LDL 68. Continue rosuvastatin. Continue to collaborate with [facility].

## 2019-02-15 NOTE — Patient Instructions (Addendum)
Hypoglycemia Hypoglycemia is when the sugar (glucose) level in your blood is too low. Signs of low blood sugar may include:  Feeling: ? Hungry. ? Worried or nervous (anxious). ? Sweaty and clammy. ? Confused. ? Dizzy. ? Sleepy. ? Sick to your stomach (nauseous).  Having: ? A fast heartbeat. ? A headache. ? A change in your vision. ? Tingling or no feeling (numbness) around your mouth, lips, or tongue. ? Jerky movements that you cannot control (seizure).  Having trouble with: ? Moving (coordination). ? Sleeping. ? Passing out (fainting). ? Getting upset easily (irritability). Low blood sugar can happen to people who have diabetes and people who do not have diabetes. Low blood sugar can happen quickly, and it can be an emergency. Treating low blood sugar Low blood sugar is often treated by eating or drinking something sugary right away, such as:  Fruit juice, 4-6 oz (120-150 mL).  Regular soda (not diet soda), 4-6 oz (120-150 mL).  Low-fat milk, 4 oz (120 mL).  Several pieces of hard candy.  Sugar or honey, 1 Tbsp (15 mL). Treating low blood sugar if you have diabetes If you can think clearly and swallow safely, follow the 15:15 rule:  Take 15 grams of a fast-acting carb (carbohydrate). Talk with your doctor about how much you should take.  Always keep a source of fast-acting carb with you, such as: ? Sugar tablets (glucose pills). Take 3-4 pills. ? 6-8 pieces of hard candy. ? 4-6 oz (120-150 mL) of fruit juice. ? 4-6 oz (120-150 mL) of regular (not diet) soda. ? 1 Tbsp (15 mL) honey or sugar.  Check your blood sugar 15 minutes after you take the carb.  If your blood sugar is still at or below 70 mg/dL (3.9 mmol/L), take 15 grams of a carb again.  If your blood sugar does not go above 70 mg/dL (3.9 mmol/L) after 3 tries, get help right away.  After your blood sugar goes back to normal, eat a meal or a snack within 1 hour.  Treating very low blood sugar If your  blood sugar is at or below 54 mg/dL (3 mmol/L), you have very low blood sugar (severe hypoglycemia). This may also cause:  Passing out.  Jerky movements you cannot control (seizure).  Losing consciousness (coma). This is an emergency. Do not wait to see if the symptoms will go away. Get medical help right away. Call your local emergency services (911 in the [place] Do not drive yourself to the hospital. If you have very low blood sugar and you cannot eat or drink, you may need a glucagon shot (injection). A family member or friend should learn how to check your blood sugar and how to give you a glucagon shot. Ask your doctor if you need to have a glucagon shot kit at home. Follow these instructions at home: General instructions  Take over-the-counter and prescription medicines only as told by your doctor.  Stay aware of your blood sugar as told by your doctor.  Limit alcohol intake to no more than 1 drink a day for nonpregnant women and 2 drinks a day for men. One drink equals 12 oz of beer (355 mL), 5 oz of wine (148 mL), or 1 oz of hard liquor (44 mL).  Keep all follow-up visits as told by your doctor. This is important. If you have diabetes:   Follow your diabetes care plan as told by your doctor. Make sure you: ? Know the signs of low blood sugar. ?  Take your medicines as told. ? Follow your exercise and meal plan. ? Eat on time. Do not skip meals. ? Check your blood sugar as often as told by your doctor. Always check it before and after exercise. ? Follow your sick day plan when you cannot eat or drink normally. Make this plan ahead of time with your doctor.  Share your diabetes care plan with: ? Your work or school. ? People you live with.  Check your pee (urine) for ketones: ? When you are sick. ? As told by your doctor.  Carry a card or wear jewelry that says you have diabetes. Contact a doctor if:  You have trouble keeping your blood sugar in your target  range.  You have low blood sugar often. Get help right away if:  You still have symptoms after you eat or drink something sugary.  Your blood sugar is at or below 54 mg/dL (3 mmol/L).  You have jerky movements that you cannot control.  You pass out. These symptoms may be an emergency. Do not wait to see if the symptoms will go away. Get medical help right away. Call your local emergency services (911 in the [place] Do not drive yourself to the hospital. Summary  Hypoglycemia happens when the level of sugar (glucose) in your blood is too low.  Low blood sugar can happen to people who have diabetes and people who do not have diabetes. Low blood sugar can happen quickly, and it can be an emergency.  Make sure you know the signs of low blood sugar and know how to treat it.  Always keep a source of sugar (fast-acting carb) with you to treat low blood sugar. This information is not intended to replace advice given to you by your health care provider. Make sure you discuss any questions you have with your health care provider. Document Released: 02/25/2010 Document Revised: 05/25/2018 Document Reviewed: 01/04/2016 Elsevier Interactive Patient Education  2019 [place] DASH stands for "Dietary Approaches to Stop Hypertension." The DASH eating plan is a healthy eating plan that has been shown to reduce high blood pressure (hypertension). It may also reduce your risk for type 2 diabetes, heart disease, and stroke. The DASH eating plan may also help with weight loss. What are tips for following this plan?  General guidelines  Avoid eating more than 2,300 mg (milligrams) of salt (sodium) a day. If you have hypertension, you may need to reduce your sodium intake to 1,500 mg a day.  Limit alcohol intake to no more than 1 drink a day for nonpregnant women and 2 drinks a day for men. One drink equals 12 oz of beer, 5 oz of wine, or 1 oz of hard liquor.  Work with your  health care provider to maintain a healthy body weight or to lose weight. Ask what an ideal weight is for you.  Get at least 30 minutes of exercise that causes your heart to beat faster (aerobic exercise) most days of the week. Activities may include walking, swimming, or biking.  Work with your health care provider or diet and nutrition specialist (dietitian) to adjust your eating plan to your individual calorie needs. Reading food labels   Check food labels for the amount of sodium per serving. Choose foods with less than 5 percent of the Daily Value of sodium. Generally, foods with less than 300 mg of sodium per serving fit into this eating plan.  To find whole grains, look  for the word "whole" as the first word in the ingredient list. Shopping  Buy products labeled as "low-sodium" or "no salt added."  Buy fresh foods. Avoid canned foods and premade or frozen meals. Cooking  Avoid adding salt when cooking. Use salt-free seasonings or herbs instead of table salt or sea salt. Check with your health care provider or pharmacist before using salt substitutes.  Do not fry foods. Cook foods using healthy methods such as baking, boiling, grilling, and broiling instead.  Cook with heart-healthy oils, such as olive, canola, soybean, or sunflower oil. Meal planning  Eat a balanced diet that includes: ? 5 or more servings of fruits and vegetables each day. At each meal, try to fill half of your plate with fruits and vegetables. ? Up to 6-8 servings of whole grains each day. ? Less than 6 oz of lean meat, poultry, or fish each day. A 3-oz serving of meat is about the same size as a deck of cards. One egg equals 1 oz. ? 2 servings of low-fat dairy each day. ? A serving of nuts, seeds, or beans 5 times each week. ? Heart-healthy fats. Healthy fats called Omega-3 fatty acids are found in foods such as flaxseeds and coldwater fish, like sardines, salmon, and mackerel.  Limit how much you eat of  the following: ? Canned or prepackaged foods. ? Food that is high in trans fat, such as fried foods. ? Food that is high in saturated fat, such as fatty meat. ? Sweets, desserts, sugary drinks, and other foods with added sugar. ? Full-fat dairy products.  Do not salt foods before eating.  Try to eat at least 2 vegetarian meals each week.  Eat more home-cooked food and less restaurant, buffet, and fast food.  When eating at a restaurant, ask that your food be prepared with less salt or no salt, if possible. What foods are recommended? The items listed may not be a complete list. Talk with your dietitian about what dietary choices are best for you. Grains Whole-grain or whole-wheat bread. Whole-grain or whole-wheat pasta. Brown rice. [place]. Bulgur. Whole-grain and low-sodium cereals. Pita bread. Low-fat, low-sodium crackers. Whole-wheat flour tortillas. Vegetables Fresh or frozen vegetables (raw, steamed, roasted, or grilled). Low-sodium or reduced-sodium tomato and vegetable juice. Low-sodium or reduced-sodium tomato sauce and tomato paste. Low-sodium or reduced-sodium canned vegetables. Fruits All fresh, dried, or frozen fruit. Canned fruit in natural juice (without added sugar). Meat and other protein foods Skinless chicken or [place]. Ground chicken or [place]. Pork with fat trimmed off. Fish and seafood. Egg whites. Dried beans, peas, or lentils. Unsalted nuts, nut butters, and seeds. Unsalted canned beans. Lean cuts of beef with fat trimmed off. Low-sodium, lean deli meat. Dairy Low-fat (1%) or fat-free (skim) milk. Fat-free, low-fat, or reduced-fat cheeses. Nonfat, low-sodium ricotta or cottage cheese. Low-fat or nonfat yogurt. Low-fat, low-sodium cheese. Fats and oils Soft margarine without trans fats. Vegetable oil. Low-fat, reduced-fat, or light mayonnaise and salad dressings (reduced-sodium). Canola, safflower, olive, soybean, and sunflower oils. Avocado. Seasoning and  other foods Herbs. Spices. Seasoning mixes without salt. Unsalted popcorn and pretzels. Fat-free sweets. What foods are not recommended? The items listed may not be a complete list. Talk with your dietitian about what dietary choices are best for you. Grains Baked goods made with fat, such as croissants, muffins, or some breads. Dry pasta or rice meal packs. Vegetables Creamed or fried vegetables. Vegetables in a cheese sauce. Regular canned vegetables (not low-sodium or reduced-sodium). Regular canned  tomato sauce and paste (not low-sodium or reduced-sodium). Regular tomato and vegetable juice (not low-sodium or reduced-sodium). Pickles. Olives. Fruits Canned fruit in a light or heavy syrup. Fried fruit. Fruit in cream or butter sauce. Meat and other protein foods Fatty cuts of meat. Ribs. Fried meat. Bacon. Sausage. Bologna and other processed lunch meats. Salami. Fatback. Hotdogs. Bratwurst. Salted nuts and seeds. Canned beans with added salt. Canned or smoked fish. Whole eggs or egg yolks. Chicken or turkey with skin. Dairy Whole or 2% milk, cream, and half-and-half. Whole or full-fat cream cheese. Whole-fat or sweetened yogurt. Full-fat cheese. Nondairy creamers. Whipped toppings. Processed cheese and cheese spreads. Fats and oils Butter. Stick margarine. Lard. Shortening. Ghee. Bacon fat. Tropical oils, such as coconut, palm kernel, or palm oil. Seasoning and other foods Salted popcorn and pretzels. Onion salt, garlic salt, seasoned salt, table salt, and sea salt. Worcestershire sauce. Tartar sauce. Barbecue sauce. Teriyaki sauce. Soy sauce, including reduced-sodium. Steak sauce. Canned and packaged gravies. Fish sauce. Oyster sauce. Cocktail sauce. Horseradish that you find on the shelf. Ketchup. Mustard. Meat flavorings and tenderizers. Bouillon cubes. Hot sauce and Tabasco sauce. Premade or packaged marinades. Premade or packaged taco seasonings. Relishes. Regular salad dressings. Where to  find more information:  National Heart, Lung, and Blood Institute: www.nhlbi.nih.gov  American Heart Association: www.heart.org Summary  The DASH eating plan is a healthy eating plan that has been shown to reduce high blood pressure (hypertension). It may also reduce your risk for type 2 diabetes, heart disease, and stroke.  With the DASH eating plan, you should limit salt (sodium) intake to 2,300 mg a day. If you have hypertension, you may need to reduce your sodium intake to 1,500 mg a day.  When on the DASH eating plan, aim to eat more fresh fruits and vegetables, whole grains, lean proteins, low-fat dairy, and heart-healthy fats.  Work with your health care provider or diet and nutrition specialist (dietitian) to adjust your eating plan to your individual calorie needs. This information is not intended to replace advice given to you by your health care provider. Make sure you discuss any questions you have with your health care provider. Document Released: 11/20/2011 Document Revised: 11/24/2016 Document Reviewed: 11/24/2016 Elsevier Interactive Patient Education  2019 [employer]  

## 2019-02-15 NOTE — Assessment & Plan Note (Addendum)
Chronic, ongoing. Most recent A1C 11.7%. Referral to chronic care management and alerted pharmacist to patient needs. Collaborate with Dr. [name] to prevent further episodes of hypoglycemia. Recommended avoiding missed meals or frequent snacks throughout the day. CMP today.

## 2019-02-16 ENCOUNTER — Telehealth: Payer: Self-pay | Admitting: Nurse Practitioner

## 2019-02-16 ENCOUNTER — Telehealth: Payer: Self-pay

## 2019-02-16 DIAGNOSIS — N184 Chronic kidney disease, stage 4 (severe): Secondary | ICD-10-CM

## 2019-02-16 DIAGNOSIS — D638 Anemia in other chronic diseases classified elsewhere: Secondary | ICD-10-CM

## 2019-02-16 LAB — CBC WITH DIFFERENTIAL/PLATELET
BASOS: 0 %
Basophils Absolute: 0 10*3/uL (ref 0.0–0.2)
EOS (ABSOLUTE): 0.1 10*3/uL (ref 0.0–0.4)
Eos: 2 %
Hemoglobin: 8.9 g/dL — ABNORMAL LOW (ref 11.1–15.9)
IMMATURE GRANS (ABS): 0 10*3/uL (ref 0.0–0.1)
Immature Granulocytes: 1 %
Lymphocytes Absolute: 1.6 10*3/uL (ref 0.7–3.1)
Lymphs: 28 %
MCH: 26.3 pg — ABNORMAL LOW (ref 26.6–33.0)
MCHC: 32 g/dL (ref 31.5–35.7)
MCV: 82 fL (ref 79–97)
Monocytes Absolute: 0.4 10*3/uL (ref 0.1–0.9)
Monocytes: 6 %
NEUTROS PCT: 63 %
Neutrophils Absolute: 3.7 10*3/uL (ref 1.4–7.0)
Platelets: 294 10*3/uL (ref 150–450)
RBC: 3.38 x10E6/uL — ABNORMAL LOW (ref 3.77–5.28)
RDW: 14 % (ref 11.7–15.4)
WBC: 5.8 10*3/uL (ref 3.4–10.8)

## 2019-02-16 LAB — COMPREHENSIVE METABOLIC PANEL
ALT: 11 IU/L (ref 0–32)
AST: 14 IU/L (ref 0–40)
Albumin/Globulin Ratio: 1.5 (ref 1.2–2.2)
Albumin: 3.7 g/dL (ref 3.7–4.7)
Alkaline Phosphatase: 92 IU/L (ref 39–117)
BUN/Creatinine Ratio: 19 (ref 12–28)
BUN: 43 mg/dL — ABNORMAL HIGH (ref 8–27)
Bilirubin Total: <0.2 mg/dL (ref 0.0–1.2)
CO2: 21 mmol/L (ref 20–29)
CREATININE: 2.23 mg/dL — AB (ref 0.57–1.00)
Calcium: 9.4 mg/dL (ref 8.7–10.3)
Chloride: 108 mmol/L — ABNORMAL HIGH (ref 96–106)
GFR calc Af Amer: 25 mL/min/{1.73_m2} — ABNORMAL LOW (ref >59)
GFR calc non Af Amer: 22 mL/min/{1.73_m2} — ABNORMAL LOW (ref >59)
GLOBULIN, TOTAL: 2.4 g/dL (ref 1.5–4.5)
Glucose: 233 mg/dL — ABNORMAL HIGH (ref 65–99)
Potassium: 5.3 mmol/L — ABNORMAL HIGH (ref 3.5–5.2)
Sodium: 143 mmol/L (ref 134–144)
Total Protein: 6.1 g/dL (ref 6.0–8.5)

## 2019-02-16 LAB — ANEMIA PANEL
FOLATE, HEMOLYSATE: 350 ng/mL
Ferritin: 15 ng/mL (ref 15–150)
Folate, RBC: 1259 ng/mL (ref >498)
Hematocrit: 27.8 % — ABNORMAL LOW (ref 34.0–46.6)
Iron Saturation: 11 % — ABNORMAL LOW (ref 15–55)
Iron: 48 ug/dL (ref 27–139)
Retic Ct Pct: 1.4 % (ref 0.6–2.6)
Total Iron Binding Capacity: 438 ug/dL (ref 250–450)
UIBC: 390 ug/dL — ABNORMAL HIGH (ref 118–369)
Vitamin B-12: 305 pg/mL (ref 232–1245)

## 2019-02-16 NOTE — Telephone Encounter (Signed)
Spoke to Tonya [name] via telephone about recent labs.  Discussed with her that kidney function is remaining at baseline, but H/H decreased from previous labs available in chart.  H/H 8.9/27.8, MCV 82, MCH 26.3, RBC 3.38, PLT 294, iron 48.  Discussed case via secure chat with Dr. Mackey Birchwood, her nephrologist.  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  She denies blood in stool or abdominal pain, heart burn.  Did report yesterday slight increase in SOB, fatigue, and cold intolerance.  Discussed this with patient, she agrees with plan of care.  Will call her daughter to update her at end of day, she is a Pharmacist, hospital and in class at this time.

## 2019-02-16 NOTE — Telephone Encounter (Signed)
-----   Message from [name], NP sent at 02/16/2019  8:10 AM EST ----- Regarding: Nephrology Can we find out who patients nephrology provider is and contact info?  I need to chat with them today about her labs from yesterday.  Thanks.

## 2019-02-16 NOTE — Telephone Encounter (Signed)
Called and spoke to patient. Patient stated her nephrology dr is Lavonia Dana, MD phone # [phone]

## 2019-02-16 NOTE — Telephone Encounter (Signed)
Spoke to patient's daughter via telephone and updated her + provided education.  She agrees with plan of care.

## 2019-02-22 ENCOUNTER — Inpatient Hospital Stay: Payer: Medicare Other | Attending: Oncology | Admitting: Oncology

## 2019-02-22 ENCOUNTER — Inpatient Hospital Stay: Payer: Medicare Other

## 2019-02-22 ENCOUNTER — Other Ambulatory Visit: Payer: Self-pay

## 2019-02-22 ENCOUNTER — Encounter: Payer: Self-pay | Admitting: Oncology

## 2019-02-22 VITALS — BP 135/85 | HR 90 | Temp 96.9°F | Ht 66.0 in | Wt 169.9 lb

## 2019-02-22 DIAGNOSIS — N184 Chronic kidney disease, stage 4 (severe): Secondary | ICD-10-CM

## 2019-02-22 DIAGNOSIS — D509 Iron deficiency anemia, unspecified: Secondary | ICD-10-CM | POA: Diagnosis not present

## 2019-02-22 DIAGNOSIS — Z807 Family history of other malignant neoplasms of lymphoid, hematopoietic and related tissues: Secondary | ICD-10-CM | POA: Diagnosis not present

## 2019-02-22 DIAGNOSIS — D631 Anemia in chronic kidney disease: Secondary | ICD-10-CM | POA: Insufficient documentation

## 2019-02-22 DIAGNOSIS — I129 Hypertensive chronic kidney disease with stage 1 through stage 4 chronic kidney disease, or unspecified chronic kidney disease: Secondary | ICD-10-CM

## 2019-02-22 NOTE — Progress Notes (Signed)
Hematology/Oncology Consult note [facility] Telephone:([phone] [phone]   Patient Care Team: [name], NP as PCP - General (Nurse Practitioner) [name], MD as PCP - Cardiology (Cardiology) Solum, Betsey Holiday, MD as Physician Assistant (Endocrinology) Lavonia Dana, MD as Consulting Physician (Nephrology) [name], MD as Consulting Physician (Cardiology)  REFERRING PROVIDER: Dr.Cannady CHIEF COMPLAINTS/REASON FOR VISIT:  Evaluation of anemia  HISTORY OF PRESENTING ILLNESS:  Tonya [name] is a  73 y.o.  female with PMH listed below who was referred to me for evaluation of anemia Reviewed patient's recent labs that was done at [facility] office. Labs revealed anemia with hemoglobin of 8.9, MCV 82, normal platelet and wbc  Reviewed patient's previous labs ordered by primary care physician's office, anemia is chronic onset , duration is since 2017 No aggravating or improving factors.  Associated signs and symptoms: Patient reports fatigue.  SOB with exertion.  Denies weight loss, easy bruising, hematochezia, hemoptysis, hematuria. Context: History of GI bleeding: denies               History of Chronic kidney disease: CKD, follows up with Dr.Kolluru.                History of autoimmune disease: denies               History of hemolytic anemia. denies               Last colonoscopy: never had colonoscopy done.     Review of Systems  Constitutional: Positive for fatigue. Negative for appetite change, chills and fever.  HENT:   Negative for hearing loss and voice change.   Eyes: Negative for eye problems.  Respiratory: Negative for chest tightness and cough.   Cardiovascular: Negative for chest pain.  Gastrointestinal: Negative for abdominal distention, abdominal pain and blood in stool.  Endocrine: Negative for hot flashes.  Genitourinary: Negative for difficulty urinating and frequency.   Musculoskeletal: Negative for  arthralgias.  Skin: Negative for itching and rash.  Neurological: Negative for extremity weakness.  Hematological: Negative for adenopathy.  Psychiatric/Behavioral: Negative for confusion.    MEDICAL HISTORY:  Past Medical History:  Diagnosis Date  . (HFpEF) heart failure with preserved ejection fraction ([place])    a. 05/2017 Echo: EF 55-60%, Gr1 DD, mildly dil LA.  . CKD (chronic kidney disease), stage III ([place])   . Diabetes mellitus without complication ([place])   . Hyperlipidemia   . Hypertension   . Stroke [facility])    a. 08/2017.    SURGICAL HISTORY: Past Surgical History:  Procedure Laterality Date  . COLONOSCOPY  12/15/2006  . TUBAL LIGATION      SOCIAL HISTORY: Social History   Socioeconomic History  . Marital status: Married    Spouse name: Not on file  . Number of children: Not on file  . Years of education: Not on file  . Highest education level: GED or equivalent  Occupational History  . Occupation: retired  Scientific laboratory technician  . Financial resource strain: Not hard at all  . Food insecurity:    Worry: Never true    Inability: Never true  . Transportation needs:    Medical: No    Non-medical: No  Tobacco Use  . Smoking status: Never Smoker  . Smokeless tobacco: Never Used  Substance and Sexual Activity  . Alcohol use: No    Alcohol/week: 0.0 standard drinks  . Drug use: No  . Sexual activity: Yes  Lifestyle  .  Physical activity:    Days per week: 0 days    Minutes per session: 0 min  . Stress: Not at all  Relationships  . Social connections:    Talks on phone: More than three times a week    Gets together: More than three times a week    Attends religious service: More than 4 times per year    Active member of club or organization: Yes    Attends meetings of clubs or organizations: More than 4 times per year    Relationship status: Married  . Intimate partner violence:    Fear of current or ex partner: No    Emotionally abused: No    Physically abused: No     Forced sexual activity: No  Other Topics Concern  . Not on file  Social History Narrative  . Not on file    FAMILY HISTORY: Family History  Problem Relation Age of Onset  . Diabetes Mother   . Stroke Mother   . Hyperlipidemia Father   . Hypertension Father   . Diabetes Sister   . Diabetes Brother   . Gout Son   . Diabetes Brother   . Kidney disease Son   . Lymphoma Sister   . Heart attack Sister   . Breast cancer Neg Hx     ALLERGIES:  is allergic to atorvastatin; gabapentin; pravastatin; and trulicity [dulaglutide].  MEDICATIONS:  Current Outpatient Medications  Medication Sig Dispense Refill  . aspirin 81 MG tablet Take 81 mg by mouth daily.    . carvedilol (COREG) 3.125 MG tablet Take 1 tablet (3.125 mg total) by mouth 2 (two) times daily with a meal. 60 tablet 6  . Cholecalciferol (VITAMIN D-3 PO) Take 125 mcg by mouth 2 (two) times a week.    . clopidogrel (PLAVIX) 75 MG tablet Take 1 tablet (75 mg total) by mouth daily. 90 tablet 0  . furosemide (LASIX) 40 MG tablet Take 1 tablet (40 mg total) by mouth daily. 90 tablet 3  . HUMALOG KWIKPEN 100 UNIT/ML KiwkPen TK 12 UNITS BEFORE LUNCH AND 8 UNITS BEFORE SUPPER.  11  . insulin detemir (LEVEMIR) 100 UNIT/ML injection Inject 12 Units into the skin at bedtime.     [name] NOVOFINE 32G X 6 MM MISC USE AS DIRECTED THREE TIMES DAILY WITH HUMALOG 100 each 0  . olmesartan (BENICAR) 40 MG tablet TAKE 1 TABLET(40 MG) BY MOUTH DAILY 90 tablet 1  . omeprazole (PRILOSEC) 20 MG capsule TAKE 1 CAPSULE(20 MG) BY MOUTH TWICE DAILY 180 capsule 0  . ONE TOUCH ULTRA TEST test strip USE TO TEST BLOOD SUGAR THREE TIMES DAILY 100 each 12  . pioglitazone (ACTOS) 15 MG tablet Take 1 tablet (15 mg total) by mouth daily. 30 tablet 11  . rosuvastatin (CRESTOR) 5 MG tablet Take 1 tablet (5 mg total) by mouth daily. 90 tablet 2   No current facility-administered medications for this visit.      PHYSICAL EXAMINATION: ECOG PERFORMANCE STATUS: 1 -  Symptomatic but completely ambulatory Vitals:   02/22/19 1104  BP: 135/85  Pulse: 90  Temp: (!) 96.9 F (36.1 C)   Filed Weights   02/22/19 1104  Weight: 169 lb 14.4 oz (77.1 kg)    Physical Exam Constitutional:      General: She is not in acute distress. HENT:     Head: Normocephalic and atraumatic.  Eyes:     General: No scleral icterus.    Pupils: Pupils are equal, round,  and reactive to light.  Neck:     Musculoskeletal: Normal range of motion and neck supple.  Cardiovascular:     Rate and Rhythm: Normal rate and regular rhythm.     Heart sounds: Normal heart sounds.  Pulmonary:     Effort: Pulmonary effort is normal. No respiratory distress.     Breath sounds: No wheezing.  Abdominal:     General: Bowel sounds are normal. There is no distension.     Palpations: Abdomen is soft. There is no mass.     Tenderness: There is no abdominal tenderness.  Musculoskeletal: Normal range of motion.        General: No deformity.  Skin:    General: Skin is warm and dry.     Findings: No erythema or rash.  Neurological:     Mental Status: She is alert and oriented to person, place, and time.     Cranial Nerves: No cranial nerve deficit.     Coordination: Coordination normal.  Psychiatric:        Behavior: Behavior normal.        Thought Content: Thought content normal.      LABORATORY DATA:  I have reviewed the data as listed Lab Results  Component Value Date   WBC 5.8 02/15/2019   HGB 8.9 (L) 02/15/2019   HCT 27.8 (L) 02/15/2019   MCV 82 02/15/2019   PLT 294 02/15/2019   Recent Labs    04/14/18 1129 12/30/18 1012 02/15/19 1405  NA 142  --  143  K 4.9  --  5.3*  CL 104  --  108*  CO2 23  --  21  GLUCOSE 306*  --  233*  BUN 45*  --  43*  CREATININE 2.35*  --  2.23*  CALCIUM 9.1  --  9.4  GFRNONAA 20*  --  22*  GFRAA 23*  --  25*  PROT 6.0 6.2* 6.1  ALBUMIN 3.8 3.3* 3.7  AST 17 22 14   ALT 11 14 11   ALKPHOS 100 93 92  BILITOT <0.2 0.7 <0.2  BILIDIR   --  <0.1  --   IBILI  --  NOT CALCULATED  --    Iron/TIBC/Ferritin/ %Sat    Component Value Date/Time   IRON 48 02/15/2019 1405   TIBC 438 02/15/2019 1405   FERRITIN 15 02/15/2019 1405   IRONPCTSAT 11 (L) 02/15/2019 1405        ASSESSMENT & PLAN:  1. Chronic kidney disease, stage 4, severely decreased GFR (HCC)   2. Hypertensive chronic kidney disease with stage 1 through stage 4 chronic kidney disease, or unspecified chronic kidney disease   3. Iron deficiency anemia, unspecified iron deficiency anemia type     Anemia: multifactorial with possible causes iron deficiency anemia as well anemia of CKD.  Recommend checking multiple myeloma panel, light chain ratio.   Iron deficiency anemia, Plan IV iron with Venofer 256m weekly x 4 doses. Allergy reactions/infusion reaction including anaphylactic reaction discussed with patient. Other side effects include but not limited to high blood pressure, skin rash, weight gain, leg swelling, etc. Patient voices understanding and willing to proceed.  Will see patient in 8 weeks with repeat cbc, iron, ferritin, TIBC, +/- Venofer. Once she has adquate iron store, will consider Erythropoietin level.   Refer to gastroenterology for work up of [place]   Orders Placed This Encounter  Procedures  . Multiple Myeloma Panel (SPEP&IFE w/QIG)    Standing Status:   Future  Number of Occurrences:   1    Standing Expiration Date:   02/22/2020  . Kappa/lambda light chains    Standing Status:   Future    Number of Occurrences:   1    Standing Expiration Date:   02/22/2020  . Ambulatory referral to Gastroenterology    Referral Priority:   Routine    Referral Type:   Consultation    Referral Reason:   Specialty Services Required    Referred to Provider:   [name], MD    Number of Visits Requested:   1    All questions were answered. The patient knows to call the clinic with any problems questions or concerns.  Return of visit: 8 weeks. Thank you  for this kind referral and the opportunity to participate in the care of this patient. A copy of today's note is routed to referring provider  Total face to face encounter time for this patient visit was 31mn. >50% of the time was  spent in counseling and coordination of care.    ZEarlie Server MD, PhD Hematology Oncology [facility] Pain And SpinePager- [phone]/09/2019

## 2019-02-22 NOTE — Progress Notes (Signed)
Patient here for follow up. Pt states she gets tired easily.

## 2019-02-23 ENCOUNTER — Ambulatory Visit: Payer: Medicare Other | Admitting: Pharmacist

## 2019-02-23 DIAGNOSIS — I10 Essential (primary) hypertension: Secondary | ICD-10-CM | POA: Diagnosis not present

## 2019-02-23 DIAGNOSIS — E1122 Type 2 diabetes mellitus with diabetic chronic kidney disease: Secondary | ICD-10-CM | POA: Diagnosis not present

## 2019-02-23 DIAGNOSIS — D631 Anemia in chronic kidney disease: Secondary | ICD-10-CM | POA: Diagnosis not present

## 2019-02-23 DIAGNOSIS — N184 Chronic kidney disease, stage 4 (severe): Secondary | ICD-10-CM | POA: Diagnosis not present

## 2019-02-23 DIAGNOSIS — R809 Proteinuria, unspecified: Secondary | ICD-10-CM | POA: Diagnosis not present

## 2019-02-23 LAB — KAPPA/LAMBDA LIGHT CHAINS
KAPPA, LAMDA LIGHT CHAIN RATIO: 1.39 (ref 0.26–1.65)
Kappa free light chain: 36.8 mg/L — ABNORMAL HIGH (ref 3.3–19.4)
LAMDA FREE LIGHT CHAINS: 26.4 mg/L — AB (ref 5.7–26.3)

## 2019-02-23 LAB — MULTIPLE MYELOMA PANEL, SERUM
Albumin SerPl Elph-Mcnc: 3.3 g/dL (ref 2.9–4.4)
Albumin/Glob SerPl: 1.3 (ref 0.7–1.7)
Alpha 1: 0.3 g/dL (ref 0.0–0.4)
Alpha2 Glob SerPl Elph-Mcnc: 0.8 g/dL (ref 0.4–1.0)
B-Globulin SerPl Elph-Mcnc: 1.1 g/dL (ref 0.7–1.3)
Gamma Glob SerPl Elph-Mcnc: 0.5 g/dL (ref 0.4–1.8)
Globulin, Total: 2.7 g/dL (ref 2.2–3.9)
IgA: 189 mg/dL (ref 64–422)
IgG (Immunoglobin G), Serum: 664 mg/dL — ABNORMAL LOW (ref 700–1600)
IgM (Immunoglobulin M), Srm: 6 mg/dL — ABNORMAL LOW (ref 26–217)
Total Protein ELP: 6 g/dL (ref 6.0–8.5)

## 2019-02-23 NOTE — Chronic Care Management (AMB) (Signed)
Chronic Care Management   Note  02/23/2019 Name: Tonya [name] MRN: [id number] DOB: 12/19/1945   Subjective:  Patient is a 73 year old female followed by [name], NP, for primary care services. Referred for Chronic Care Management services related to diabetes, CKD, HTN.   Contacted Ms. Fountain telephonically today  Ms. Charlie was given information about Chronic Care Management services today including:  1. CCM service includes personalized support from designated clinical staff supervised by her physician, including individualized plan of care and coordination with other care providers 2. 24/7 contact phone numbers for assistance for urgent and routine care needs. 3. Service will only be billed when office clinical staff spend 20 minutes or more in a month to coordinate care. 4. Only one practitioner may furnish and bill the service in a calendar month. 5. The patient may stop CCM services at any time (effective at the end of the month) by phone call to the office staff. 6. The patient will be responsible for cost sharing (co-pay) of up to 20% of the service fee (after annual deductible is met).  Patient agreed to services and verbal consent obtained.   Review of patient status, including review of consultants reports, laboratory and other test data, was performed as part of comprehensive evaluation and provision of chronic care management services.   Objective: Lab Results  Component Value Date   CREATININE 2.23 (H) 02/15/2019   CREATININE 2.35 (H) 04/14/2018   CREATININE 1.47 (H) 09/10/2016    Lab Results  Component Value Date   HGBA1C 11.0 05/13/2018    Lipid Panel     Component Value Date/Time   CHOL 159 12/30/2018 1012   CHOL 239 (H) 09/10/2016 1112   TRIG 62 12/30/2018 1012   HDL 79 12/30/2018 1012   HDL 58 09/10/2016 1112   CHOLHDL 2.0 12/30/2018 1012   VLDL 12 12/30/2018 1012   LDLCALC 68 12/30/2018 1012   LDLCALC 134 (H) 09/10/2016 1112   LDLDIRECT 67  12/30/2018 1012    BP Readings from Last 3 Encounters:  02/22/19 135/85  02/15/19 138/78  12/27/18 140/88    Allergies  Allergen Reactions  . Atorvastatin     Myalgias   . Gabapentin Other (See Comments)    Speech impairment   . Pravastatin     Myalgias   . Trulicity [Dulaglutide] Hives    Medications Reviewed Today    Reviewed by Evelina Dun, RN (Registered Nurse) on 02/22/19 at 1059  Med List Status: <None>  Medication Order Taking? Sig Documenting Provider Last Dose Status Informant  aspirin 81 MG tablet [id number]  Take 81 mg by mouth daily. [provider]  Active   carvedilol (COREG) 3.125 MG tablet [id number]  Take 1 tablet (3.125 mg total) by mouth 2 (two) times daily with a meal. Theora Gianotti, NP  Active   Cholecalciferol (VITAMIN D-3 PO) [id number]  Take 125 mcg by mouth 2 (two) times a week. [provider]  Active   clopidogrel (PLAVIX) 75 MG tablet [id number]  Take 1 tablet (75 mg total) by mouth daily. [name] T, NP  Active   furosemide (LASIX) 40 MG tablet [id number]  Take 1 tablet (40 mg total) by mouth daily. [name] T, NP  Active   HUMALOG KWIKPEN 100 UNIT/ML KiwkPen [id number]  TK 12 UNITS BEFORE LUNCH AND 8 UNITS BEFORE SUPPER. [provider]  Active   insulin detemir (LEVEMIR) 100 UNIT/ML injection [id number]  Inject into the skin daily. [provider]  Active   NOVOFINE 32G X 6 MM MISC [id number]  USE AS DIRECTED THREE TIMES DAILY WITH HUMALOG Cannady, [name] T, NP  Active   olmesartan (BENICAR) 40 MG tablet [id number]  TAKE 1 TABLET(40 MG) BY MOUTH DAILY Cannady, [name] T, NP  Active   omeprazole (PRILOSEC) 20 MG capsule [id number]  TAKE 1 CAPSULE(20 MG) BY MOUTH TWICE DAILY Cannady, [name] T, NP  Active   ONE TOUCH ULTRA TEST test strip [id number]  USE TO TEST BLOOD SUGAR THREE TIMES DAILY Kathrine Haddock, NP  Active   pioglitazone (ACTOS) 15 MG tablet [id number]  Take 1 tablet (15 mg total) by  mouth daily. [name] T, NP  Active   rosuvastatin (CRESTOR) 5 MG tablet [id number]  Take 1 tablet (5 mg total) by mouth daily. [name], MD  Active            Assessment:    Goals Addressed            This Visit's Progress     Patient Stated   . "I need help managing my blood sugars" (pt-stated)       Current Barriers:  [name] Knowledge Deficits related to management of diabetes  . Hypoglycemia with current regimen . Pioglitazone therapy with concurrent HFpEF  Pharmacist Clinical Goal(s):  [name] Over the next 30 days, patient will demonstrate improved understanding of prescribed medications and rationale for usage as evidenced by conversations with PharmD  Interventions: . Comprehensive medication review performed. . Collaboration with provider re: medication management- contacted Dr. Joycie Peek office regarding previous patient reports of hypoglycemia, use of pioglitazone in heart failure; Dr. [name] plans to discuss at upcoming visit 03/15/2019  Patient Self Care Activities:  . Self administers medications as prescribed  Plan: . Patient will Continue to check blood sugars and report to Dr. [name] at upcoming visit . PharmD willoutreach patient after endocrinology visit to discuss any medication regimen changes  Initial goal documentation        Pharmacist telephone follow up scheduled 03/22/2019.    [name], PharmD Clinical Pharmacist [place] [phone]

## 2019-02-23 NOTE — Patient Instructions (Signed)
Visit Information  Goals Addressed            This Visit's Progress     Patient Stated   . "I need help managing my blood sugars" (pt-stated)       Current Barriers:  . Knowledge Deficits related to management of diabetes  . Hypoglycemia with current regimen . Pioglitazone therapy with concurrent HFpEF  Pharmacist Clinical Goal(s):  . Over the next 30 days, patient will demonstrate improved understanding of prescribed medications and rationale for usage as evidenced by conversations with PharmD  Interventions: . Comprehensive medication review performed. . Collaboration with provider re: medication management- contacted Dr. Solum's office regarding previous patient reports of hypoglycemia, use of pioglitazone in heart failure; Dr. Solum plans to discuss at upcoming visit 03/15/2019  Patient Self Care Activities:  . Self administers medications as prescribed  Plan: . Patient will Continue to check blood sugars and report to Dr. Solum at upcoming visit . PharmD willoutreach patient after endocrinology visit to discuss any medication regimen changes  Initial goal documentation        Ms. Blatter was given information about Chronic Care Management services today including:  1. CCM service includes personalized support from designated clinical staff supervised by her physician, including individualized plan of care and coordination with other care providers 2. 24/7 contact phone numbers for assistance for urgent and routine care needs. 3. Service will only be billed when office clinical staff spend 20 minutes or more in a month to coordinate care. 4. Only one practitioner may furnish and bill the service in a calendar month. 5. The patient may stop CCM services at any time (effective at the end of the month) by phone call to the office staff. 6. The patient will be responsible for cost sharing (co-pay) of up to 20% of the service fee (after annual deductible is met).  Patient  agreed to services and verbal consent obtained.   Print copy of patient instructions provided.     Pharmacist telephone follow up scheduled 03/22/2019.  Catie Travis, PharmD Clinical Pharmacist [facility] [phone]   

## 2019-03-01 ENCOUNTER — Other Ambulatory Visit: Payer: Self-pay | Admitting: Nurse Practitioner

## 2019-03-01 NOTE — Telephone Encounter (Signed)
Requested Prescriptions  Pending Prescriptions Disp Refills  . clopidogrel (PLAVIX) 75 MG tablet [Pharmacy Med Name: CLOPIDOGREL 75MG  TABLETS] 90 tablet 0    Sig: TAKE 1 TABLET BY MOUTH DAILY     Hematology: Antiplatelets - clopidogrel Failed - 03/01/2019  3:10 AM      Failed - Evaluate AST, ALT within 2 months of therapy initiation.      Failed - HCT in normal range and within 180 days    Hematocrit  Date Value Ref Range Status  02/15/2019 27.8 (L) 34.0 - 46.6 % Final         Failed - HGB in normal range and within 180 days    Hemoglobin  Date Value Ref Range Status  02/15/2019 8.9 (L) 11.1 - 15.9 g/dL Final         Passed - ALT in normal range and within 360 days    ALT  Date Value Ref Range Status  02/15/2019 11 0 - 32 IU/L Final         Passed - AST in normal range and within 360 days    AST  Date Value Ref Range Status  02/15/2019 14 0 - 40 IU/L Final         Passed - PLT in normal range and within 180 days    Platelets  Date Value Ref Range Status  02/15/2019 294 150 - 450 x10E3/uL Final         Passed - Valid encounter within last 6 months    Recent Outpatient Visits          2 weeks ago Poorly controlled type 2 diabetes mellitus with neuropathy ([place])   [place], [name], NP   3 months ago Flu vaccine need   [employer], [place] T, NP   9 months ago Postural dizziness with presyncope   [facility] Kathrine Haddock, NP   10 months ago [place] Kathrine Haddock, NP   1 year ago 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   [facility] Kathrine Haddock, NP      Future Appointments            In 2 months Cannady, [name], NP [employer]   In 4 months  [employer] [facility]

## 2019-03-03 ENCOUNTER — Other Ambulatory Visit: Payer: Self-pay

## 2019-03-04 ENCOUNTER — Other Ambulatory Visit: Payer: Self-pay

## 2019-03-04 ENCOUNTER — Inpatient Hospital Stay: Payer: Medicare Other | Attending: Oncology

## 2019-03-04 VITALS — BP 155/88 | HR 88 | Temp 96.9°F | Resp 18

## 2019-03-04 DIAGNOSIS — D509 Iron deficiency anemia, unspecified: Secondary | ICD-10-CM | POA: Insufficient documentation

## 2019-03-04 DIAGNOSIS — D500 Iron deficiency anemia secondary to blood loss (chronic): Secondary | ICD-10-CM

## 2019-03-04 MED ORDER — IRON SUCROSE 20 MG/ML IV SOLN
200.0000 mg | Freq: Once | INTRAVENOUS | Status: AC
  Administered 2019-03-04: 200 mg via INTRAVENOUS
  Filled 2019-03-04: qty 10

## 2019-03-04 MED ORDER — SODIUM CHLORIDE 0.9 % IV SOLN
Freq: Once | INTRAVENOUS | Status: AC
  Administered 2019-03-04: 14:00:00 via INTRAVENOUS
  Filled 2019-03-04: qty 250

## 2019-03-09 DIAGNOSIS — R809 Proteinuria, unspecified: Secondary | ICD-10-CM | POA: Diagnosis not present

## 2019-03-09 DIAGNOSIS — N2581 Secondary hyperparathyroidism of renal origin: Secondary | ICD-10-CM | POA: Diagnosis not present

## 2019-03-09 DIAGNOSIS — D631 Anemia in chronic kidney disease: Secondary | ICD-10-CM | POA: Diagnosis not present

## 2019-03-09 DIAGNOSIS — N184 Chronic kidney disease, stage 4 (severe): Secondary | ICD-10-CM | POA: Diagnosis not present

## 2019-03-09 DIAGNOSIS — E1122 Type 2 diabetes mellitus with diabetic chronic kidney disease: Secondary | ICD-10-CM | POA: Diagnosis not present

## 2019-03-09 DIAGNOSIS — I10 Essential (primary) hypertension: Secondary | ICD-10-CM | POA: Diagnosis not present

## 2019-03-10 ENCOUNTER — Other Ambulatory Visit: Payer: Self-pay

## 2019-03-11 ENCOUNTER — Inpatient Hospital Stay: Payer: Medicare Other

## 2019-03-11 ENCOUNTER — Other Ambulatory Visit: Payer: Self-pay

## 2019-03-11 VITALS — BP 116/77 | HR 97 | Temp 98.7°F | Resp 20

## 2019-03-11 DIAGNOSIS — Z807 Family history of other malignant neoplasms of lymphoid, hematopoietic and related tissues: Secondary | ICD-10-CM | POA: Diagnosis not present

## 2019-03-11 DIAGNOSIS — D509 Iron deficiency anemia, unspecified: Secondary | ICD-10-CM | POA: Diagnosis not present

## 2019-03-11 DIAGNOSIS — I129 Hypertensive chronic kidney disease with stage 1 through stage 4 chronic kidney disease, or unspecified chronic kidney disease: Secondary | ICD-10-CM | POA: Diagnosis not present

## 2019-03-11 DIAGNOSIS — N184 Chronic kidney disease, stage 4 (severe): Secondary | ICD-10-CM | POA: Diagnosis not present

## 2019-03-11 DIAGNOSIS — D500 Iron deficiency anemia secondary to blood loss (chronic): Secondary | ICD-10-CM

## 2019-03-11 MED ORDER — SODIUM CHLORIDE 0.9 % IV SOLN
Freq: Once | INTRAVENOUS | Status: AC
  Administered 2019-03-11: 14:00:00 via INTRAVENOUS
  Filled 2019-03-11: qty 250

## 2019-03-11 MED ORDER — IRON SUCROSE 20 MG/ML IV SOLN
200.0000 mg | Freq: Once | INTRAVENOUS | Status: AC
  Administered 2019-03-11: 200 mg via INTRAVENOUS
  Filled 2019-03-11: qty 10

## 2019-03-15 ENCOUNTER — Ambulatory Visit (INDEPENDENT_AMBULATORY_CARE_PROVIDER_SITE_OTHER): Payer: Medicare Other | Admitting: Pharmacist

## 2019-03-15 DIAGNOSIS — Z9114 Patient's other noncompliance with medication regimen: Secondary | ICD-10-CM | POA: Insufficient documentation

## 2019-03-15 DIAGNOSIS — E1122 Type 2 diabetes mellitus with diabetic chronic kidney disease: Secondary | ICD-10-CM | POA: Diagnosis not present

## 2019-03-15 DIAGNOSIS — Z794 Long term (current) use of insulin: Secondary | ICD-10-CM | POA: Diagnosis not present

## 2019-03-15 DIAGNOSIS — I10 Essential (primary) hypertension: Secondary | ICD-10-CM | POA: Insufficient documentation

## 2019-03-15 DIAGNOSIS — E1165 Type 2 diabetes mellitus with hyperglycemia: Secondary | ICD-10-CM | POA: Insufficient documentation

## 2019-03-15 DIAGNOSIS — E1140 Type 2 diabetes mellitus with diabetic neuropathy, unspecified: Secondary | ICD-10-CM

## 2019-03-15 DIAGNOSIS — E113393 Type 2 diabetes mellitus with moderate nonproliferative diabetic retinopathy without macular edema, bilateral: Secondary | ICD-10-CM | POA: Diagnosis not present

## 2019-03-15 DIAGNOSIS — E1121 Type 2 diabetes mellitus with diabetic nephropathy: Secondary | ICD-10-CM | POA: Diagnosis not present

## 2019-03-15 NOTE — Patient Instructions (Signed)
Visit Information  Goals Addressed            This Visit's Progress     Patient Stated   . "I need help managing my blood sugars" (pt-stated)       Current Barriers:  [name] Knowledge Deficits related to management of diabetes  . Hypoglycemia with current regimen o Patient reports she saw endocrinology today - they recommended she change Levemir administration to QAM to prevent overnight/early morning hypoglycemia . Pioglitazone therapy with concurrent HFpEF o Patient notes that pioglitazone therapy with heart failure was not discussed at her visit  Pharmacist Clinical Goal(s):  [name] Over the next 30 days, patient will demonstrate improved understanding of prescribed medications and rationale for usage as evidenced by conversations with PharmD  Interventions: . [facility] - spoke with Janett Billow, RN for Dr. [name]. Explained my concern for pioglitazone with concurrent HF, asked if [facility] had patient's HFpEF diagnosis on file and she stated that they didn't. She asked that we have cardiology records faxed over to the office.  . I contacted Dr Donivan Scull office; asked that his most recent visit note discussing HFpEF and the results of her recent ECHO be faxed to Dr. Joycie Peek office   Patient Self Care Activities:  . Self administers medications as prescribed   Please see past updates related to this goal by clicking on the "Past Updates" button in the selected goal         The patient verbalized understanding of instructions provided today and declined a print copy of patient instruction materials.   Plan: - Will f/u with Dr. Joycie Peek office later this week to ensure they received the visit notes from cardiology.   [name], PharmD Clinical Pharmacist [place] [phone]

## 2019-03-15 NOTE — Chronic Care Management (AMB) (Signed)
  Chronic Care Management   Follow Up Note   03/15/2019 Name: Tonya [name] MRN: [id number] DOB: 14-Aug-1946  Referred by: Tonya Lick, NP Reason for referral : Chronic Care Management (Diabetes)   Tonya [name] is a 73 y.o. year old female who is a primary care patient of Cannady, Tonya Faster, NP. The CCM team was consulted for assistance with chronic disease management and care coordination needs.    Contacted patient to f/u on results/changed at today's visit with her endocrinologist, Tonya. Gabriel [name].   Review of patient status, including review of consultants reports, relevant laboratory and other test results, and collaboration with appropriate care team members and the patient's provider was performed as part of comprehensive patient evaluation and provision of chronic care management services.    Goals Addressed            This Visit's Progress     Patient Stated   . "I need help managing my blood sugars" (pt-stated)       Current Barriers:  Tonya [name] Knowledge Deficits related to management of diabetes  . Hypoglycemia with current regimen o Patient reports she saw endocrinology today - they recommended she change Levemir administration to QAM to prevent overnight/early morning hypoglycemia . Pioglitazone therapy with concurrent HFpEF o Patient notes that pioglitazone therapy with heart failure was not discussed at her visit  Pharmacist Clinical Goal(s):  Tonya [name] Over the next 30 days, patient will demonstrate improved understanding of prescribed medications and rationale for usage as evidenced by conversations with PharmD  Interventions: . [facility] End[facility] - spoke with Tonya Billow, RN for Tonya. Gabriel [name]. Explained my concern for pioglitazone with concurrent HF, asked if [facility] had patient's HFpEF diagnosis on file and she stated that they didn't. She asked that we have cardiology records faxed over to the office.  . I contacted Tonya [name] office; asked that his  most recent visit note discussing HFpEF and the results of her recent ECHO be faxed to Tonya. Joycie [name] office   Patient Self Care Activities:  . Self administers medications as prescribed   Please see past updates related to this goal by clicking on the "Past Updates" button in the selected goal         Plan: - Will f/u with Tonya. Joycie [name] office later this week to ensure they received the visit notes from cardiology.   Tonya [name], PharmD Clinical Pharmacist Tonya [name] [phone]

## 2019-03-17 ENCOUNTER — Other Ambulatory Visit: Payer: Self-pay

## 2019-03-18 ENCOUNTER — Telehealth: Payer: Self-pay

## 2019-03-18 ENCOUNTER — Inpatient Hospital Stay: Payer: Medicare Other | Attending: Oncology

## 2019-03-18 ENCOUNTER — Other Ambulatory Visit: Payer: Self-pay

## 2019-03-18 VITALS — BP 125/81 | HR 89 | Temp 97.2°F | Resp 18

## 2019-03-18 DIAGNOSIS — D500 Iron deficiency anemia secondary to blood loss (chronic): Secondary | ICD-10-CM

## 2019-03-18 DIAGNOSIS — D509 Iron deficiency anemia, unspecified: Secondary | ICD-10-CM | POA: Diagnosis not present

## 2019-03-18 MED ORDER — SODIUM CHLORIDE 0.9 % IV SOLN
Freq: Once | INTRAVENOUS | Status: AC
  Administered 2019-03-18: 14:00:00 via INTRAVENOUS
  Filled 2019-03-18: qty 250

## 2019-03-18 MED ORDER — IRON SUCROSE 20 MG/ML IV SOLN
200.0000 mg | Freq: Once | INTRAVENOUS | Status: AC
  Administered 2019-03-18: 200 mg via INTRAVENOUS

## 2019-03-21 ENCOUNTER — Ambulatory Visit: Payer: Self-pay | Admitting: Pharmacist

## 2019-03-21 DIAGNOSIS — E1140 Type 2 diabetes mellitus with diabetic neuropathy, unspecified: Secondary | ICD-10-CM

## 2019-03-21 DIAGNOSIS — E1165 Type 2 diabetes mellitus with hyperglycemia: Principal | ICD-10-CM

## 2019-03-21 NOTE — Chronic Care Management (AMB) (Signed)
  Chronic Care Management   Follow Up Note   03/21/2019 Name: Tonya [name] MRN: [id number] DOB: 03/25/1946  Referred by: [name], NP Reason for referral : Chronic Care Management (Medication Management)   Tonya [name] is a 73 y.o. year old female who is a primary care patient of Cannady, [name], NP. The CCM team was consulted for assistance with chronic disease management and care coordination needs.    Review of patient status, including review of consultants reports, relevant laboratory and other test results, and collaboration with appropriate care team members and the patient's provider was performed as part of comprehensive patient evaluation and provision of chronic care management services.    Goals Addressed            This Visit's Progress     Patient Stated   . "I need help managing my blood sugars" (pt-stated)       Current Barriers:  [name] Knowledge Deficits related to management of diabetes  . Hypoglycemia with current regimen . Pioglitazone therapy with concurrent HFpEF  Pharmacist Clinical Goal(s):  [name] Over the next 30 days, patient will demonstrate improved understanding of prescribed medications and rationale for usage as evidenced by conversations with PharmD  Interventions: . Received call back from [place], [place] with Dr. [name]. She notes that Dr. [name] received the copy of the visit note from cardiology Dr. [name] Situ, noting that patient has HFpEF; Dr. [name] acknowledges this and recommended that the patient continue pioglitazone therapy  Patient Self Care Activities:  . Self administers medications as prescribed   Please see past updates related to this goal by clicking on the "Past Updates" button in the selected goal          Plan:  - Will communicate this with PCP [name], NP - PharmD will follow up with patient in 3 weeks as previously scheduled   [name], PharmD Clinical Pharmacist [place] [phone]

## 2019-03-21 NOTE — Patient Instructions (Signed)
Visit Information  Goals Addressed            This Visit's Progress     Patient Stated   . "I need help managing my blood sugars" (pt-stated)       Current Barriers:  [name] Knowledge Deficits related to management of diabetes  . Hypoglycemia with current regimen . Pioglitazone therapy with concurrent HFpEF  Pharmacist Clinical Goal(s):  [name] Over the next 30 days, patient will demonstrate improved understanding of prescribed medications and rationale for usage as evidenced by conversations with PharmD  Interventions: . Received call back from [place], [place] with Dr. [name]. She notes that Dr. [name] received the copy of the visit note from cardiology Dr. [name] Situ, noting that patient has HFpEF; Dr. [name] acknowledges this and recommended that the patient continue pioglitazone therapy  Patient Self Care Activities:  . Self administers medications as prescribed   Please see past updates related to this goal by clicking on the "Past Updates" button in the selected goal         The patient verbalized understanding of instructions provided today and declined a print copy of patient instruction materials.   Plan:  - Will communicate this with PCP [name], NP - PharmD will follow up with patient in 3 weeks as previously scheduled  [name], PharmD Clinical Pharmacist [place] [phone]

## 2019-03-22 ENCOUNTER — Telehealth: Payer: Self-pay

## 2019-03-24 ENCOUNTER — Other Ambulatory Visit: Payer: Self-pay

## 2019-03-25 ENCOUNTER — Other Ambulatory Visit: Payer: Self-pay

## 2019-03-25 ENCOUNTER — Inpatient Hospital Stay: Payer: Medicare Other

## 2019-03-25 VITALS — BP 136/85 | HR 86 | Resp 18

## 2019-03-25 DIAGNOSIS — D509 Iron deficiency anemia, unspecified: Secondary | ICD-10-CM | POA: Diagnosis not present

## 2019-03-25 DIAGNOSIS — D500 Iron deficiency anemia secondary to blood loss (chronic): Secondary | ICD-10-CM

## 2019-03-25 MED ORDER — IRON SUCROSE 20 MG/ML IV SOLN
200.0000 mg | Freq: Once | INTRAVENOUS | Status: AC
  Administered 2019-03-25: 200 mg via INTRAVENOUS
  Filled 2019-03-25: qty 10

## 2019-03-25 MED ORDER — SODIUM CHLORIDE 0.9 % IV SOLN
Freq: Once | INTRAVENOUS | Status: AC
  Administered 2019-03-25: 14:00:00 via INTRAVENOUS
  Filled 2019-03-25: qty 250

## 2019-03-29 ENCOUNTER — Other Ambulatory Visit: Payer: Self-pay

## 2019-03-29 MED ORDER — ROSUVASTATIN CALCIUM 5 MG PO TABS: 5.0000 mg | ORAL_TABLET | Freq: Every day | ORAL | 3 refills | Status: DC

## 2019-03-29 MED ORDER — CARVEDILOL 3.125 MG PO TABS: 3.1250 mg | ORAL_TABLET | Freq: Two times a day (BID) | ORAL | 3 refills | Status: DC

## 2019-03-30 ENCOUNTER — Ambulatory Visit (INDEPENDENT_AMBULATORY_CARE_PROVIDER_SITE_OTHER): Payer: Medicare Other | Admitting: Gastroenterology

## 2019-03-30 ENCOUNTER — Other Ambulatory Visit: Payer: Self-pay

## 2019-03-30 DIAGNOSIS — D509 Iron deficiency anemia, unspecified: Secondary | ICD-10-CM

## 2019-03-30 MED ORDER — OMEPRAZOLE 20 MG PO CPDR: DELAYED_RELEASE_CAPSULE | ORAL | 0 refills | Status: DC

## 2019-03-30 NOTE — Telephone Encounter (Signed)
Patient last seen 02/15/19, has appt 05/18/19.

## 2019-03-30 NOTE — Progress Notes (Signed)
No show

## 2019-04-02 ENCOUNTER — Other Ambulatory Visit: Payer: Self-pay | Admitting: Nurse Practitioner

## 2019-04-05 ENCOUNTER — Ambulatory Visit (INDEPENDENT_AMBULATORY_CARE_PROVIDER_SITE_OTHER): Payer: Medicare Other | Admitting: Pharmacist

## 2019-04-05 DIAGNOSIS — E1140 Type 2 diabetes mellitus with diabetic neuropathy, unspecified: Secondary | ICD-10-CM

## 2019-04-05 DIAGNOSIS — E1165 Type 2 diabetes mellitus with hyperglycemia: Secondary | ICD-10-CM

## 2019-04-05 NOTE — Chronic Care Management (AMB) (Signed)
  Chronic Care Management   Follow Up Note   04/05/2019 Name: Tonya [name] MRN: [id number] DOB: January 13, 1946  Referred by: [name], NP Reason for referral : Chronic Care Management (Diabetes)   Tonya [name] is a 73 y.o. year old female who is a primary care patient of Cannady, [name], NP. The CCM team was consulted for assistance with chronic disease management and care coordination needs.    Review of patient status, including review of consultants reports, relevant laboratory and other test results, and collaboration with appropriate care team members and the patient's provider was performed as part of comprehensive patient evaluation and provision of chronic care management services.    Goals Addressed            This Visit's Progress     Patient Stated   . "I need help managing my blood sugars" (pt-stated)       Current Barriers:  [name] Knowledge Deficits related to management of diabetes and appropriate goals o Reports fasting BG 69-130; pre lunch 180-200s, and pre supper 200-300s. She notes that these are "doing really well", but cannot verbalize goal BG or A1c o Taking Levemir 12 units QAM and Humalog 8-12 units before breakfast and before supper, as she typically just eats "snacks" throughout the day, including fruits (peach cups, apples, bananas); reports breakfast as 2 pieces bacon, 1 poached egg, 1 slice toast and coffee; supper often baked chicken, vegetables (rinses if canned, but often frozen) . Reports continued LEE; notes that she takes furosemide daily.   Pharmacist Clinical Goal(s):  [name] Over the next 30 days, patient will work with PharmD and PCP to improve monitoring of blood glucose and understanding of appropriate dietary choices  Interventions: . Reviewed blood sugar results. Re-educated on goal A1c definitely <8%, possibly <7% if can be achieved without hypoglycemia. Reviewed goal FBG <130 and goal 2 hour PPG <180. [name] Explained to patient that higher  doses of mealtime insulin may be required, as fasting sugars are at goal, but later readings increase throughout the day. Encouraged to occasionally check 2 hour post prandial to evaluate impact of current prandial insulin dosing on blood sugar control . Encouraged patient to continue to evaluate carbohydrate content of meals   Patient Self Care Activities:  . Self administers medications as prescribed   Please see past updates related to this goal by clicking on the "Past Updates" button in the selected goal          Plan: - PharmD will outreach patient in 3-4 weeks to follow up on blood sugars, LEE, and general health management  [name], PharmD Clinical Pharmacist [place] [phone]

## 2019-04-05 NOTE — Patient Instructions (Signed)
Visit Information  Goals Addressed            This Visit's Progress     Patient Stated   . "I need help managing my blood sugars" (pt-stated)       Current Barriers:  [name] Knowledge Deficits related to management of diabetes and appropriate goals o Reports fasting BG 69-130; pre lunch 180-200s, and pre supper 200-300s. She notes that these are "doing really well", but cannot verbalize goal BG or A1c o Taking Levemir 12 units QAM and Humalog 8-12 units before breakfast and before supper, as she typically just eats "snacks" throughout the day, including fruits (peach cups, apples, bananas); reports breakfast as 2 pieces bacon, 1 poached egg, 1 slice toast and coffee; supper often baked chicken, vegetables (rinses if canned, but often frozen) . Reports continued LEE; notes that she takes furosemide daily.   Pharmacist Clinical Goal(s):  [name] Over the next 30 days, patient will work with PharmD and PCP to improve monitoring of blood glucose and understanding of appropriate dietary choices  Interventions: . Reviewed blood sugar results. Re-educated on goal A1c definitely <8%, possibly <7% if can be achieved without hypoglycemia. Reviewed goal FBG <130 and goal 2 hour PPG <180. [name] Explained to patient that higher doses of mealtime insulin may be required, as fasting sugars are at goal, but later readings increase throughout the day. Encouraged to occasionally check 2 hour post prandial to evaluate impact of current prandial insulin dosing on blood sugar control . Encouraged patient to continue to evaluate carbohydrate content of meals   Patient Self Care Activities:  . Self administers medications as prescribed   Please see past updates related to this goal by clicking on the "Past Updates" button in the selected goal         The patient verbalized understanding of instructions provided today and declined a print copy of patient instruction materials.   Plan: - PharmD will outreach patient  in 3-4 weeks to follow up on blood sugars, LEE, and general health management  [name], PharmD Clinical Pharmacist [place] [phone]

## 2019-04-15 ENCOUNTER — Other Ambulatory Visit: Payer: Self-pay

## 2019-04-15 ENCOUNTER — Telehealth: Payer: Self-pay | Admitting: Oncology

## 2019-04-15 ENCOUNTER — Other Ambulatory Visit: Payer: Self-pay | Admitting: *Deleted

## 2019-04-15 ENCOUNTER — Inpatient Hospital Stay: Payer: Medicare Other | Attending: Oncology

## 2019-04-15 DIAGNOSIS — D500 Iron deficiency anemia secondary to blood loss (chronic): Secondary | ICD-10-CM

## 2019-04-15 LAB — IRON AND TIBC
Iron: 66 ug/dL (ref 28–170)
Saturation Ratios: 18 % (ref 10.4–31.8)
TIBC: 362 ug/dL (ref 250–450)
UIBC: 296 ug/dL

## 2019-04-15 LAB — CBC WITH DIFFERENTIAL/PLATELET
Abs Immature Granulocytes: 0.04 10*3/uL (ref 0.00–0.07)
Basophils Absolute: 0 10*3/uL (ref 0.0–0.1)
Basophils Relative: 1 %
Eosinophils Absolute: 0.2 10*3/uL (ref 0.0–0.5)
Eosinophils Relative: 3 %
HCT: 33.7 % — ABNORMAL LOW (ref 36.0–46.0)
Hemoglobin: 10.4 g/dL — ABNORMAL LOW (ref 12.0–15.0)
Immature Granulocytes: 1 %
Lymphocytes Relative: 24 %
Lymphs Abs: 1.5 10*3/uL (ref 0.7–4.0)
MCH: 26.4 pg (ref 26.0–34.0)
MCHC: 30.9 g/dL (ref 30.0–36.0)
MCV: 85.5 fL (ref 80.0–100.0)
Monocytes Absolute: 0.4 10*3/uL (ref 0.1–1.0)
Monocytes Relative: 6 %
Neutro Abs: 4.1 10*3/uL (ref 1.7–7.7)
Neutrophils Relative %: 65 %
Platelets: 260 10*3/uL (ref 150–400)
RBC: 3.94 MIL/uL (ref 3.87–5.11)
RDW: 16.4 % — ABNORMAL HIGH (ref 11.5–15.5)
WBC: 6.2 10*3/uL (ref 4.0–10.5)
nRBC: 0 % (ref 0.0–0.2)

## 2019-04-15 LAB — FERRITIN: Ferritin: 144 ng/mL (ref 11–307)

## 2019-04-17 ENCOUNTER — Other Ambulatory Visit: Payer: Self-pay

## 2019-04-18 ENCOUNTER — Encounter: Payer: Self-pay | Admitting: Oncology

## 2019-04-18 ENCOUNTER — Inpatient Hospital Stay (HOSPITAL_BASED_OUTPATIENT_CLINIC_OR_DEPARTMENT_OTHER): Payer: Medicare Other | Admitting: Oncology

## 2019-04-18 ENCOUNTER — Inpatient Hospital Stay: Payer: Medicare Other

## 2019-04-18 DIAGNOSIS — Z7982 Long term (current) use of aspirin: Secondary | ICD-10-CM | POA: Diagnosis not present

## 2019-04-18 DIAGNOSIS — E119 Type 2 diabetes mellitus without complications: Secondary | ICD-10-CM

## 2019-04-18 DIAGNOSIS — D631 Anemia in chronic kidney disease: Secondary | ICD-10-CM | POA: Diagnosis not present

## 2019-04-18 DIAGNOSIS — Z79899 Other long term (current) drug therapy: Secondary | ICD-10-CM

## 2019-04-18 DIAGNOSIS — D500 Iron deficiency anemia secondary to blood loss (chronic): Secondary | ICD-10-CM

## 2019-04-18 DIAGNOSIS — Z794 Long term (current) use of insulin: Secondary | ICD-10-CM

## 2019-04-18 DIAGNOSIS — I10 Essential (primary) hypertension: Secondary | ICD-10-CM

## 2019-04-18 DIAGNOSIS — N184 Chronic kidney disease, stage 4 (severe): Secondary | ICD-10-CM

## 2019-04-18 MED ORDER — FERROUS SULFATE 325 (65 FE) MG PO TBEC: 325.0000 mg | DELAYED_RELEASE_TABLET | Freq: Three times a day (TID) | ORAL | 1 refills | Status: DC

## 2019-04-18 MED ORDER — DOCUSATE SODIUM 100 MG PO CAPS: 100.0000 mg | ORAL_CAPSULE | Freq: Every day | ORAL | 0 refills | Status: DC

## 2019-04-18 NOTE — Progress Notes (Signed)
HEMATOLOGY-ONCOLOGY TeleHEALTH VISIT PROGRESS NOTE  I connected with Tonya [name] on 04/18/19 at 10:00 AM EDT by video enabled telemedicine visit and verified that I am speaking with the correct person using two identifiers.  Patient was Video visit using Doximity. However,  We had tried the video enabled face-to-face encounter via Doximety but due to patient's poor Internet connection and limitation of her cell phone, I cannot proceed with video enabled face-to-face encounter and has proceed with telephone visit. I discussed the limitations, risks, security and privacy concerns of performing an evaluation and management service by telemedicine and the availability of in-person appointments. I also discussed with the patient that there may be a patient responsible charge related to this service. The patient expressed understanding and agreed to proceed.   Other persons participating in the visit and their role in the encounter:  Janeann Merl, RN, check in patient.   Patient's location: Home  Provider's location: Work  Risk analyst Complaint: Follow-up for management of anemia   INTERVAL HISTORY Tonya [name] is a 73 y.o. female who has above history reviewed by me today presents for follow up visit for management of iron deficiency anemia and anemia of chronic kidney disease.    Problems and complaints are listed below:  Patient was last seen by me on 02/22/2019.  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  Patient received IV Venofer 200 mg weekly x4.  She was also referred to gastroenterology for further evaluation of iron deficiency anemia. Due to COVID-19, her appointment with Dr. Vicente Males was rescheduled to June.  Patient reports feeling chronic fatigue level is a lot better after IV iron infusion. She had chronic swelling of both legs, decreased at night. No calf tenderness. Review of Systems  Constitutional:  Positive for fatigue. Negative for appetite change, chills and fever.  HENT:   Negative for hearing loss and voice change.   Eyes: Negative for eye problems.  Respiratory: Negative for chest tightness and cough.   Cardiovascular: Positive for leg swelling. Negative for chest pain.  Gastrointestinal: Negative for abdominal distention, abdominal pain and blood in stool.  Endocrine: Negative for hot flashes.  Genitourinary: Negative for difficulty urinating and frequency.   Musculoskeletal: Negative for arthralgias.  Skin: Negative for itching and rash.  Neurological: Negative for extremity weakness.  Hematological: Negative for adenopathy.  Psychiatric/Behavioral: Negative for confusion.    Past Medical History:  Diagnosis Date  . (HFpEF) heart failure with preserved ejection fraction ([place])    a. 05/2017 Echo: EF 55-60%, Gr1 DD, mildly dil LA.  . CKD (chronic kidney disease), stage III ([place])   . Diabetes mellitus without complication ([place])   . Hyperlipidemia   . Hypertension   . Stroke [facility])    a. 08/2017.   Past Surgical History:  Procedure Laterality Date  . COLONOSCOPY  12/15/2006  . TUBAL LIGATION      Family History  Problem Relation Age of Onset  . Diabetes Mother   . Stroke Mother   . Hyperlipidemia Father   . Hypertension Father   . Diabetes Sister   . Diabetes Brother   . Gout Son   . Diabetes Brother   . Kidney disease Son   . Lymphoma Sister   . Heart attack Sister   . Breast cancer Neg Hx     Social History   Socioeconomic History  . Marital status: Married    Spouse name: Not on  file  . Number of children: Not on file  . Years of education: Not on file  . Highest education level: GED or equivalent  Occupational History  . Occupation: retired  Scientific laboratory technician  . Financial resource strain: Not hard at all  . Food insecurity:    Worry: Never true    Inability: Never true  . Transportation needs:    Medical: No    Non-medical: No  Tobacco Use  . Smoking  status: Never Smoker  . Smokeless tobacco: Never Used  Substance and Sexual Activity  . Alcohol use: No    Alcohol/week: 0.0 standard drinks  . Drug use: No  . Sexual activity: Yes  Lifestyle  . Physical activity:    Days per week: 0 days    Minutes per session: 0 min  . Stress: Not at all  Relationships  . Social connections:    Talks on phone: More than three times a week    Gets together: More than three times a week    Attends religious service: More than 4 times per year    Active member of club or organization: Yes    Attends meetings of clubs or organizations: More than 4 times per year    Relationship status: Married  . Intimate partner violence:    Fear of current or ex partner: No    Emotionally abused: No    Physically abused: No    Forced sexual activity: No  Other Topics Concern  . Not on file  Social History Narrative  . Not on file    Current Outpatient Medications on File Prior to Visit  Medication Sig Dispense Refill  . aspirin 81 MG tablet Take 81 mg by mouth daily.    . carvedilol (COREG) 3.125 MG tablet Take 1 tablet (3.125 mg total) by mouth 2 (two) times daily with a meal. 180 tablet 3  . Cholecalciferol (VITAMIN D-3 PO) Take 125 mcg by mouth once a week.     . clopidogrel (PLAVIX) 75 MG tablet TAKE 1 TABLET BY MOUTH DAILY 90 tablet 0  . furosemide (LASIX) 40 MG tablet Take 1 tablet (40 mg total) by mouth daily. 90 tablet 3  . HUMALOG KWIKPEN 100 UNIT/ML KiwkPen TK 12 UNITS BEFORE LUNCH AND 8 UNITS BEFORE SUPPER.  11  . insulin detemir (LEVEMIR) 100 UNIT/ML injection Inject 12 Units into the skin every morning.     [name] NOVOFINE 32G X 6 MM MISC USE AS DIRECTED THREE TIMES DAILY WITH HUMALOG. 100 each 0  . olmesartan (BENICAR) 40 MG tablet TAKE 1 TABLET(40 MG) BY MOUTH DAILY 90 tablet 1  . omeprazole (PRILOSEC) 20 MG capsule TAKE 1 CAPSULE(20 MG) BY MOUTH TWICE DAILY 180 capsule 0  . ONE TOUCH ULTRA TEST test strip USE TO TEST BLOOD SUGAR THREE TIMES DAILY  100 each 12  . pioglitazone (ACTOS) 15 MG tablet Take 1 tablet (15 mg total) by mouth daily. 30 tablet 11  . rosuvastatin (CRESTOR) 5 MG tablet Take 1 tablet (5 mg total) by mouth daily. 90 tablet 3   No current facility-administered medications on file prior to visit.     Allergies  Allergen Reactions  . Atorvastatin     Myalgias   . Gabapentin Other (See Comments)    Speech impairment   . Pravastatin     Myalgias   . Trulicity [Dulaglutide] Hives       Observations/Objective: There were no vitals filed for this visit. There is no height or  weight on file to calculate BMI.  Pain level 0 Physical Exam  Constitutional: She is oriented to person, place, and time. She appears distressed.  HENT:  Head: Normocephalic and atraumatic.  Eyes: Pupils are equal, round, and reactive to light.  Pulmonary/Chest: Effort normal.  Neurological: She is alert and oriented to person, place, and time.  Psychiatric: Affect normal.    CBC    Component Value Date/Time   WBC 6.2 04/15/2019 1129   RBC 3.94 04/15/2019 1129   HGB 10.4 (L) 04/15/2019 1129   HGB 8.9 (L) 02/15/2019 1405   HCT 33.7 (L) 04/15/2019 1129   HCT 27.8 (L) 02/15/2019 1405   PLT 260 04/15/2019 1129   PLT 294 02/15/2019 1405   MCV 85.5 04/15/2019 1129   MCV 82 02/15/2019 1405   MCH 26.4 04/15/2019 1129   MCHC 30.9 04/15/2019 1129   RDW 16.4 (H) 04/15/2019 1129   RDW 14.0 02/15/2019 1405   LYMPHSABS 1.5 04/15/2019 1129   LYMPHSABS 1.6 02/15/2019 1405   MONOABS 0.4 04/15/2019 1129   EOSABS 0.2 04/15/2019 1129   EOSABS 0.1 02/15/2019 1405   BASOSABS 0.0 04/15/2019 1129   BASOSABS 0.0 02/15/2019 1405    CMP     Component Value Date/Time   NA 143 02/15/2019 1405   K 5.3 (H) 02/15/2019 1405   CL 108 (H) 02/15/2019 1405   CO2 21 02/15/2019 1405   GLUCOSE 233 (H) 02/15/2019 1405   BUN 43 (H) 02/15/2019 1405   CREATININE 2.23 (H) 02/15/2019 1405   CALCIUM 9.4 02/15/2019 1405   PROT 6.1 02/15/2019 1405   ALBUMIN  3.7 02/15/2019 1405   AST 14 02/15/2019 1405   ALT 11 02/15/2019 1405   ALKPHOS 92 02/15/2019 1405   BILITOT <0.2 02/15/2019 1405   GFRNONAA 22 (L) 02/15/2019 1405   GFRAA 25 (L) 02/15/2019 1405     Assessment and Plan: 1. Iron deficiency anemia due to chronic blood loss   2. Chronic kidney disease, stage 4, severely decreased GFR (HCC)   3. Anemia due to stage 4 chronic kidney disease (HCC)    #Iron deficiency anemia Labs are reviewed and discussed with patient. Her hemoglobin has improved to 10.4 after IV Venofer infusions.  Ferritin has improved to 144.  Would like to further improve ferritin level to be above 250-300 in the setting of chronic kidney disease. Given the COVID 19 pandemic, I will hold additional IV Venofer at this point. Advised patient to start taking ferrous sulfate 325 mg twice daily with stool softener Colace 100 mg daily for maintenance treatment. Patient will follow-up with gastroenterology in June 2020 for further evaluation.  Anemia in CKD stage 4, currently her hemoglobin is above 10 and erythropoietin replacement treatment is not indicated. Recommend patient to follow-up in the clinic in 10 weeks for lab and MD assessment.  Follow Up Instructions: Lab MD 10 weeks.  Labs prior. +/- Venofer   I discussed the assessment and treatment plan with the patient. The patient was provided an opportunity to ask questions and all were answered. The patient agreed with the plan and demonstrated an understanding of the instructions.  The patient was advised to call back or seek an in-person evaluation if the symptoms worsen or if the condition fails to improve as anticipated.   Earlie Server, MD 04/18/2019 10:58 PM

## 2019-04-18 NOTE — Progress Notes (Signed)
Patient contacted for virtual visit. Pt complains of swelling to both legs, that decreases at night.

## 2019-04-26 ENCOUNTER — Telehealth: Payer: Self-pay

## 2019-04-29 ENCOUNTER — Telehealth: Payer: Self-pay

## 2019-04-29 ENCOUNTER — Ambulatory Visit: Payer: Self-pay | Admitting: Pharmacist

## 2019-04-29 NOTE — Chronic Care Management (AMB) (Signed)
  Chronic Care Management   Note  04/29/2019 Name: SIGNE TACKITT MRN: [id number] DOB: 02/25/1946  Patient is a 73 year old female followed by [name], NP for primary care, referred to chronic care management team for support in managing DM, HF, CKD, chronic pain.   Attempted to contact patient to discuss diabetic self-monitoring, medication management. Was unable to leave a voicemail on either listed number.    Follow up plan: - Will follow up in the next 3-4 weeks for continued chronic care management support  [name], PharmD Clinical Pharmacist [place] [phone]

## 2019-05-18 ENCOUNTER — Ambulatory Visit: Payer: Self-pay | Admitting: Pharmacist

## 2019-05-18 ENCOUNTER — Ambulatory Visit (INDEPENDENT_AMBULATORY_CARE_PROVIDER_SITE_OTHER): Payer: Medicare Other | Admitting: Nurse Practitioner

## 2019-05-18 ENCOUNTER — Other Ambulatory Visit: Payer: Self-pay

## 2019-05-18 ENCOUNTER — Encounter: Payer: Self-pay | Admitting: Nurse Practitioner

## 2019-05-18 VITALS — BP 140/88 | HR 87 | Temp 98.2°F | Wt 172.6 lb

## 2019-05-18 DIAGNOSIS — E559 Vitamin D deficiency, unspecified: Secondary | ICD-10-CM | POA: Diagnosis not present

## 2019-05-18 DIAGNOSIS — Z1239 Encounter for other screening for malignant neoplasm of breast: Secondary | ICD-10-CM

## 2019-05-18 DIAGNOSIS — E1140 Type 2 diabetes mellitus with diabetic neuropathy, unspecified: Secondary | ICD-10-CM

## 2019-05-18 DIAGNOSIS — E785 Hyperlipidemia, unspecified: Secondary | ICD-10-CM

## 2019-05-18 DIAGNOSIS — E1165 Type 2 diabetes mellitus with hyperglycemia: Secondary | ICD-10-CM

## 2019-05-18 DIAGNOSIS — I1310 Hypertensive heart and chronic kidney disease without heart failure, with stage 1 through stage 4 chronic kidney disease, or unspecified chronic kidney disease: Secondary | ICD-10-CM | POA: Diagnosis not present

## 2019-05-18 DIAGNOSIS — N184 Chronic kidney disease, stage 4 (severe): Secondary | ICD-10-CM | POA: Diagnosis not present

## 2019-05-18 DIAGNOSIS — B0229 Other postherpetic nervous system involvement: Secondary | ICD-10-CM

## 2019-05-18 DIAGNOSIS — Z1211 Encounter for screening for malignant neoplasm of colon: Secondary | ICD-10-CM

## 2019-05-18 DIAGNOSIS — E1169 Type 2 diabetes mellitus with other specified complication: Secondary | ICD-10-CM

## 2019-05-18 LAB — MICROALBUMIN, URINE WAIVED
Creatinine, Urine Waived: 100 mg/dL (ref 10–300)
Microalb, Ur Waived: 150 mg/L — ABNORMAL HIGH (ref 0–19)
Microalb/Creat Ratio: >300 mg/g — ABNORMAL HIGH (ref <30)

## 2019-05-18 LAB — LIPID PANEL PICCOLO, WAIVED
Chol/HDL Ratio Piccolo,Waive: 2.1 mg/dL
Cholesterol Piccolo, Waived: 165 mg/dL (ref <200)
HDL Chol Piccolo, Waived: 77 mg/dL (ref >59)
LDL Chol Calc Piccolo Waived: 73 mg/dL (ref <100)
Triglycerides Piccolo,Waived: 78 mg/dL (ref <150)
VLDL Chol Calc Piccolo,Waive: 16 mg/dL (ref <30)

## 2019-05-18 LAB — BAYER DCA HB A1C WAIVED: HB A1C (BAYER DCA - WAIVED): 7.4 % — ABNORMAL HIGH (ref <7.0)

## 2019-05-18 MED ORDER — PREGABALIN 25 MG PO CAPS: 25.0000 mg | ORAL_CAPSULE | Freq: Every day | ORAL | 2 refills | Status: DC

## 2019-05-18 MED ORDER — OLMESARTAN MEDOXOMIL 40 MG PO TABS: ORAL_TABLET | ORAL | 1 refills | Status: DC

## 2019-05-18 NOTE — Patient Instructions (Addendum)
It was great to see you today!  Use your new pill box to help organize your medications:   Morning: carvedilol, aspirin*, clopidogrel*, furosemide, olmesartan*, omeprazole, ferrous sulfate (iron) pioglitazone*, rosuvastatin* Evening: carvedilol, ferrous sulfate (iron)  Remember to take the third dose of the ferrous sulfate.   *means you can take this in the morning OR the evening, so if you rather put some of these in the evening box so that you aren't taking so many medications in the morning, you can do that!   Please check your blood pressure daily for the next week, at different times of the day, and write these results down for me.    I'll call next Wednesday to check in on you!  Visit Information  Goals Addressed            This Visit's Progress     Patient Stated   . "I need help managing my blood sugars" (pt-stated)       Current Barriers:  [name] Knowledge Deficits related to management of diabetes and appropriate goals . T2DM, uncontrolled but improved, A1c checked today in clinic of 7.4%; managed on Levemir 12 units QAM and Novolog 8-12 units with meals (typically twice daily); pioglitazone 15 mg daily o 1+ pitting edema today, reports LEE  . Patient reports increased focus on medication adherence and dietary interventions   Pharmacist Clinical Goal(s):  [name] Over the next 30 days, patient will work with PharmD and PCP to improve monitoring of blood glucose and understanding of appropriate dietary choices  Interventions: . Congratulated patient on improved A1c. Encouraged continued medication adherence and focus on diet . Follow up scheduled with endocrinology on 06/15/2019. Will plan to call endocrinology prior to then to see if they would evaluate d/c pioglitazone at that appointment, given significant improvement in A1c and LEE . Encouraged patient to continue to evaluate carbohydrate content of meals   Patient Self Care Activities:  . Self administers medications as  prescribed   Please see past updates related to this goal by clicking on the "Past Updates" button in the selected goal      . "I need to watch my blood pressure" (pt-stated)       Current Barriers:  . HTN w/ CKD and HFpEF; followed by cardiology Dr. [name] Situ . Upon medication review, it was noted that patient did not have olmesartan in her bag, and has only been taking carvedilol once daily. Per Epic Refill History, olmesartan has not been filled since 11/2018 o Patient reports that her daughter fills a weekly, once daily pill box for her . Patient reports checking BP regularly and recording results; reports SBP typically 140s, but reports reading of 170 the other afternoon.  Pharmacist Clinical Goal(s):  [name] Over the next 30 days, patient will work with CCM team and cardiology to address needs related to optimized cardiovascular medication management  Interventions: . Comprehensive medication review performed.  . Collaborated with [name], NP to have refill of olmesartan sent to local pharmacy. I plan to call patient next week to review medication management and BP results . Provided patient with a BID weekly pill box.  . Provided patient with a list of which medications to put in AM slot and which to put in PM slot  Patient Self Care Activities:  . Patient has not been administering all medications as prescribed, but will utilize the twice daily pill box to improve adherence . Patient will check BP daily x1 week and record results  Initial  goal documentation        Print copy of patient instructions provided.   Plan:  - Will follow up with patient next week regarding medication changes made today  [name], PharmD Clinical Pharmacist [place] [phone]

## 2019-05-18 NOTE — Assessment & Plan Note (Signed)
Chronic, ongoing.  Continue current medication regimen.  Lipid panel today and CMP.

## 2019-05-18 NOTE — Assessment & Plan Note (Signed)
Chronic, ongoing.  Will trial low dose, 25 MG of Lyrica.  Return in 2 weeks for f/u.

## 2019-05-18 NOTE — Chronic Care Management (AMB) (Signed)
Chronic Care Management   Follow Up Note   05/18/2019 Name: Tonya [name] MRN: [id number] DOB: [birth year]  Referred by: [name], NP Reason for referral : Chronic Care Management   Tonya [name] is a 73 y.o. year old female who is a primary care patient of Cannady, [name], NP. The CCM team was consulted for assistance with chronic disease management and care coordination needs.    Met with patient face to face during clinic appointment with [name], NP  Review of patient status, including review of consultants reports, relevant laboratory and other test results, and collaboration with appropriate care team members and the patient's provider was performed as part of comprehensive patient evaluation and provision of chronic care management services.    Goals Addressed            This Visit's Progress     Patient Stated   . "I need help managing my blood sugars" (pt-stated)       Current Barriers:  [name] Knowledge Deficits related to management of diabetes and appropriate goals . T2DM, uncontrolled but improved, A1c checked today in clinic of 7.4%; managed on Levemir 12 units QAM and Novolog 8-12 units with meals (typically twice daily); pioglitazone 15 mg daily o 1+ pitting edema today, reports LEE  . Patient reports increased focus on medication adherence and dietary interventions   Pharmacist Clinical Goal(s):  [name] Over the next 30 days, patient will work with PharmD and PCP to improve monitoring of blood glucose and understanding of appropriate dietary choices  Interventions: . Congratulated patient on improved A1c. Encouraged continued medication adherence and focus on diet . Follow up scheduled with endocrinology on 06/15/2019. Will plan to call endocrinology prior to then to see if they would evaluate d/c pioglitazone at that appointment, given significant improvement in A1c and LEE . Encouraged patient to continue to evaluate carbohydrate content of meals    Patient Self Care Activities:  . Self administers medications as prescribed   Please see past updates related to this goal by clicking on the "Past Updates" button in the selected goal      . "I need to watch my blood pressure" (pt-stated)       Current Barriers:  . HTN w/ CKD and HFpEF; followed by cardiology Dr. [name] Situ . Upon medication review, it was noted that patient did not have olmesartan in her bag, and has only been taking carvedilol once daily. Per Epic Refill History, olmesartan has not been filled since 11/2018 o Patient reports that her daughter fills a weekly, once daily pill box for her . Patient reports checking BP regularly and recording results; reports SBP typically 140s, but reports reading of 170 the other afternoon.  Pharmacist Clinical Goal(s):  [name] Over the next 30 days, patient will work with CCM team and cardiology to address needs related to optimized cardiovascular medication management  Interventions: . Comprehensive medication review performed.  . Collaborated with [name], NP to have refill of olmesartan sent to local pharmacy. I plan to call patient next week to review medication management and BP results . Provided patient with a BID weekly pill box.  . Provided patient with a list of which medications to put in AM slot and which to put in PM slot  Patient Self Care Activities:  . Patient has not been administering all medications as prescribed, but will utilize the twice daily pill box to improve adherence . Patient will check BP daily x1 week and record  results  Initial goal documentation         Plan:  - PharmD will follow up with patient in 1 week regarding today's medication changes  [name], PharmD Clinical Pharmacist [place] [phone]

## 2019-05-18 NOTE — Patient Instructions (Signed)
Pregabalin capsules What is this medicine? PREGABALIN (pre GAB a lin) is used to treat nerve pain from diabetes, shingles, spinal cord injury, and fibromyalgia. It is also used to control seizures in epilepsy. This medicine may be used for other purposes; ask your health care provider or pharmacist if you have questions. COMMON BRAND NAME(S): Lyrica What should I tell my health care provider before I take this medicine? They need to know if you have any of these conditions: -bleeding problems -heart disease, including heart failure -history of alcohol or drug abuse -kidney disease -suicidal thoughts, plans, or attempt; a previous suicide attempt by you or a family member -an unusual or allergic reaction to pregabalin, gabapentin, other medicines, foods, dyes, or preservatives -pregnant or trying to get pregnant or trying to conceive with your partner -breast-feeding How should I use this medicine? Take this medicine by mouth with a glass of water. Follow the directions on the prescription label. You can take it with or without food. If it upsets your stomach, take it with food. Take your medicine at regular intervals. Do not take it more often than directed. Do not stop taking except on your doctor's advice. A special MedGuide will be given to you by the pharmacist with each prescription and refill. Be sure to read this information carefully each time. Talk to your pediatrician regarding the use of this medicine in children. While this drug may be prescribed for children as young as 1 month for selected conditions, precautions do apply. Overdosage: If you think you have taken too much of this medicine contact a poison control center or emergency room at once. NOTE: This medicine is only for you. Do not share this medicine with others. What if I miss a dose? If you miss a dose, take it as soon as you can. If it is almost time for your next dose, take only that dose. Do not take double or extra  doses. What may interact with this medicine? -alcohol -certain medicines for blood pressure like captopril, enalapril, or lisinopril -certain medicines for diabetes, like pioglitazone or rosiglitazone -certain medicines for anxiety or sleep -narcotic medicines for pain This list may not describe all possible interactions. Give your health care provider a list of all the medicines, herbs, non-prescription drugs, or dietary supplements you use. Also tell them if you smoke, drink alcohol, or use illegal drugs. Some items may interact with your medicine. What should I watch for while using this medicine? Tell your doctor or healthcare professional if your symptoms do not start to get better or if they get worse. Visit your doctor or health care professional for regular checks on your progress. Do not stop taking except on your doctor's advice. You may develop a severe reaction. Your doctor will tell you how much medicine to take. Wear a medical identification bracelet or chain if you are taking this medicine for seizures, and carry a card that describes your disease and details of your medicine and dosage times. You may get drowsy or dizzy. Do not drive, use machinery, or do anything that needs mental alertness until you know how this medicine affects you. Do not stand or sit up quickly, especially if you are an older patient. This reduces the risk of dizzy or fainting spells. Alcohol may interfere with the effect of this medicine. Avoid alcoholic drinks. If you have a heart condition, like congestive heart failure, and notice that you are retaining water and have swelling in your hands or feet, contact your   health care provider immediately. The use of this medicine may increase the chance of suicidal thoughts or actions. Pay special attention to how you are responding while on this medicine. Any worsening of mood, or thoughts of suicide or dying should be reported to your health care professional right  away. This medicine has caused reduced sperm counts in some men. This may interfere with the ability to father a child. You should talk to your doctor or health care professional if you are concerned about your fertility. Women who become pregnant while using this medicine for seizures may enroll in the [place] Antiepileptic Drug Pregnancy Registry by calling 1-888-233-2334. This registry collects information about the safety of antiepileptic drug use during pregnancy. What side effects may I notice from receiving this medicine? Side effects that you should report to your doctor or health care professional as soon as possible: -allergic reactions like skin rash, itching or hives, swelling of the face, lips, or tongue -breathing problems -changes in vision -chest pain -confusion -jerking or unusual movements of any part of your body -loss of memory -muscle pain, tenderness, or weakness -suicidal thoughts or other mood changes -swelling of the ankles, feet, hands -unusual bruising or bleeding Side effects that usually do not require medical attention (report to your doctor or health care professional if they continue or are bothersome): -dizziness -drowsiness -dry mouth -headache -nausea -tremors -trouble sleeping -weight gain This list may not describe all possible side effects. Call your doctor for medical advice about side effects. You may report side effects to FDA at 1-800-FDA-1088. Where should I keep my medicine? Keep out of the reach of children. This medicine can be abused. Keep your medicine in a safe place to protect it from theft. Do not share this medicine with anyone. Selling or giving away this medicine is dangerous and against the law. This medicine may cause accidental overdose and death if it taken by other adults, children, or pets. Mix any unused medicine with a substance like cat litter or coffee grounds. Then throw the medicine away in a sealed container like a  sealed bag or a coffee can with a lid. Do not use the medicine after the expiration date. Store at room temperature between 15 and 30 degrees C (59 and 86 degrees F). NOTE: This sheet is a summary. It may not cover all possible information. If you have questions about this medicine, talk to your doctor, pharmacist, or health care provider.  2019 Elsevier/Gold Standard (2018-05-12 10:23:36)  

## 2019-05-18 NOTE — Progress Notes (Signed)
BP 140/88   Pulse 87   Temp 98.2 F (36.8 C) (Oral)   Wt 172 lb 9.6 oz (78.3 kg)   LMP  (LMP Unknown)   SpO2 100%   BMI 27.86 kg/m    Subjective:    Patient ID: Tonya [name], female    DOB: Nov 20, 1946, 73 y.o.   MRN: [id number]  HPI: Tonya [name] is a 73 y.o. female  Chief Complaint  Patient presents with  . Follow-up  . Hypertension  . Diabetes  . Pain    Post shingle pain. Would like to see neurology   DIABETES Last saw Dr. [name] 03/15/2019, at this time wishes to keep patient on Actos 15 MG daily. Last A1C in March 2020 was 10.2 and today is 7.4%.  Does endorse she has been eating differently and focused more on diet. Hypoglycemic episodes:she states not lately, but has in past Polydipsia/polyuria: no Visual disturbance: no Chest pain: no Paresthesias: no Glucose Monitoring: yes  Accucheck frequency: TID  Fasting glucose: this morning 136, she reports on average since March she has been lower than 136 in morning  Post prandial:  Evening: last night 80, often is in 210-220 range  Before meals: often in the 200's, sometimes a little lower in 80 range Taking Insulin?: yes  Long acting insulin: Levemir 12 units in morning  Short acting insulin: Humalog 8 units Blood Pressure Monitoring: a few times a week Retinal Examination: Up to Date Foot Exam: Up to Date Pneumovax: Up to Date Influenza: Up to Date Aspirin: yes   HYPERTENSION / HYPERLIPIDEMIA Followed by cardiology, Dr. [name] Situ.  Last saw him in January 2020.  Reviewed medications along with CCM pharmacist.  Patient has not filled Olemsartan since December and no pill bottle in medication bag she brought with her. Will refill today, discussed with patient.  She also has been taking Carvedilol once a day, was provided with morning/evening pill box at this appointment and educated on it.   Satisfied with current treatment? yes Duration of hypertension: chronic BP monitoring frequency: a few times a week BP  range: 130-140/80's on average and she reports occasional SBP 160 BP medication side effects: no Duration of hyperlipidemia: chronic Cholesterol medication side effects: no Cholesterol supplements: none Medication compliance: good compliance Aspirin: yes Recent stressors: no Recurrent headaches: no Visual changes: no Palpitations: no Dyspnea: no Chest pain: no Lower extremity edema: no Dizzy/lightheaded: no   CHRONIC KIDNEY DISEASE Sees nephrology, Dr. [name], in March last. CKD status: stable Medications renally dose: yes Previous renal evaluation: yes Pneumovax:  Up to Date Influenza Vaccine:  Up to Date   CRCL based on March labs was 28.50  ANEMIA Followed by Dr. [name] at [facility] and last seen 04/18/2019.  Currently on oral supplement, but plan on review of note is to continue IV iron once Covid restrictions lifted.  Has only been taking iron supplement once a day, did not realize she was to take it 3 times a day.  Does endorse some fatigue and SOB on occasion with activity, educated on taking supplement three times a day until IV iron transfusions resume.   Anemia status: stable Etiology of anemia: Duration of anemia treatment:  Compliance with treatment: good compliance Iron supplementation side effects: no Severity of anemia: mild Fatigue: no Decreased exercise tolerance: no  Dyspnea on exertion: no Palpitations: no Bleeding: no Pica: no   POST HERPETIC NEURALGIA: With current CrCl can do Gabapentin 600 mg/day in 1 to 2 divided doses.  Had shingles about 6 years ago, states it was in the abdominal area and back.  Has had ongoing back pain since this time and when things touch it there is burning and irritation feeling . Feels discomfort when walking too  On the right lower back.  Reports the pain as constant, ranges from 2/10 while sitting but with walking 7-8/10. Has tried amitriptyline, gabapentin, and lidocaine patches without success.  Gabapentin caused slurred  speech and fatigue.  States the pain can make it difficult to function. Itching:  no Burning:  yes Oozing:  no Blisters:  no Fevers:  no Alleviating factors:  nothing Status: fluctuating Treatments attempted: Amitriptyline, Gabapentin, and Lidocaine patches  Relevant past medical, surgical, family and social history reviewed and updated as indicated. Interim medical history since our last visit reviewed. Allergies and medications reviewed and updated.  Review of Systems  Constitutional: Negative for activity change, appetite change, diaphoresis, fatigue and fever.  Respiratory: Negative for cough, chest tightness and shortness of breath.   Cardiovascular: Negative for chest pain, palpitations and leg swelling.  Gastrointestinal: Negative for abdominal distention, abdominal pain, constipation, diarrhea, nausea and vomiting.  Endocrine: Negative for cold intolerance, heat intolerance, polydipsia, polyphagia and polyuria.  Musculoskeletal: Positive for back pain (neuralgia).  Neurological: Negative for dizziness, syncope, weakness, light-headedness, numbness and headaches.  Psychiatric/Behavioral: Negative.     Per HPI unless specifically indicated above     Objective:    BP 140/88   Pulse 87   Temp 98.2 F (36.8 C) (Oral)   Wt 172 lb 9.6 oz (78.3 kg)   LMP  (LMP Unknown)   SpO2 100%   BMI 27.86 kg/m   Wt Readings from Last 3 Encounters:  05/18/19 172 lb 9.6 oz (78.3 kg)  02/22/19 169 lb 14.4 oz (77.1 kg)  02/15/19 171 lb 6 oz (77.7 kg)    Physical Exam Vitals signs and nursing note reviewed.  Constitutional:      General: She is awake.     Appearance: She is well-developed.  HENT:     Head: Normocephalic.     Right Ear: Hearing normal.     Left Ear: Hearing normal.     Nose: Nose normal.     Mouth/Throat:     Mouth: Mucous membranes are moist.  Eyes:     General: Lids are normal.        Right eye: No discharge.        Left eye: No discharge.      Conjunctiva/sclera: Conjunctivae normal.     Pupils: Pupils are equal, round, and reactive to light.  Neck:     Musculoskeletal: Normal range of motion and neck supple.     Thyroid: No thyromegaly.     Vascular: No carotid bruit or JVD.  Cardiovascular:     Rate and Rhythm: Normal rate and regular rhythm.     Heart sounds: Normal heart sounds. No murmur. No gallop.   Pulmonary:     Effort: Pulmonary effort is normal.     Breath sounds: Normal breath sounds.  Abdominal:     General: Bowel sounds are normal.     Palpations: Abdomen is soft. There is no hepatomegaly or splenomegaly.  Musculoskeletal:     Lumbar back: She exhibits normal range of motion, no tenderness, no edema and no pain.     Right lower leg: Edema (1+) present.     Left lower leg: Edema (1+) present.  Lymphadenopathy:     Cervical: No cervical adenopathy.  Skin:    General: Skin is warm and dry.     Comments: No rash noted to back or abdomen.  Neurological:     Mental Status: She is alert and oriented to person, place, and time.  Psychiatric:        Attention and Perception: Attention normal.        Mood and Affect: Mood normal.        Behavior: Behavior normal. Behavior is cooperative.        Thought Content: Thought content normal.        Judgment: Judgment normal.     Results for orders placed or performed in visit on 04/15/19  Iron and TIBC  Result Value Ref Range   Iron 66 28 - 170 ug/dL   TIBC 362 250 - 450 ug/dL   Saturation Ratios 18 10.4 - 31.8 %   UIBC 296 ug/dL  Ferritin  Result Value Ref Range   Ferritin 144 11 - 307 ng/mL  CBC with Differential/Platelet  Result Value Ref Range   WBC 6.2 4.0 - 10.5 K/uL   RBC 3.94 3.87 - 5.11 MIL/uL   Hemoglobin 10.4 (L) 12.0 - 15.0 g/dL   HCT 33.7 (L) 36.0 - 46.0 %   MCV 85.5 80.0 - 100.0 fL   MCH 26.4 26.0 - 34.0 pg   MCHC 30.9 30.0 - 36.0 g/dL   RDW 16.4 (H) 11.5 - 15.5 %   Platelets 260 150 - 400 K/uL   nRBC 0.0 0.0 - 0.2 %   Neutrophils  Relative % 65 %   Neutro Abs 4.1 1.7 - 7.7 K/uL   Lymphocytes Relative 24 %   Lymphs Abs 1.5 0.7 - 4.0 K/uL   Monocytes Relative 6 %   Monocytes Absolute 0.4 0.1 - 1.0 K/uL   Eosinophils Relative 3 %   Eosinophils Absolute 0.2 0.0 - 0.5 K/uL   Basophils Relative 1 %   Basophils Absolute 0.0 0.0 - 0.1 K/uL   Immature Granulocytes 1 %   Abs Immature Granulocytes 0.04 0.00 - 0.07 K/uL      Assessment & Plan:   Problem List Items Addressed This Visit      Cardiovascular and Mediastinum   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  BP slightly above goal today, but has not been taking Olmesartan.  Refill sent and education provided.  Continue current medication regimen.  CMP today.  Continue to collaborate with nephrology and cardiology.      Relevant Medications   olmesartan (BENICAR) 40 MG tablet     Endocrine   Poorly controlled type 2 diabetes mellitus with neuropathy ([place]) - Primary    Chronic, ongoing.  A1C today 7.4%.  Praised for success with diet changes.  Continue current medication regimen and consider reduction if ongoing success.  Continue to collaborate with Dr. [name] and CCM team.  Urine ALB 150 and A:C >300.        Relevant Medications   olmesartan (BENICAR) 40 MG tablet   Other Relevant Orders   Bayer DCA Hb A1c Waived   Microalbumin, Urine Waived   Hyperlipidemia associated with type 2 diabetes mellitus (HCC)    Chronic, ongoing.  Continue current medication regimen.  Lipid panel today and CMP.      Relevant Medications   olmesartan (BENICAR) 40 MG tablet   Other Relevant Orders   Comprehensive metabolic panel   Lipid Panel Piccolo, Waived  Nervous and Auditory   Post herpetic neuralgia    Chronic, ongoing.  Will trial low dose, 25 MG of Lyrica.  Return in 2 weeks for f/u.         Genitourinary   Chronic kidney disease, stage 4, severely decreased GFR (HCC)    Chronic, ongoing.  Continue  current medication regimen and collaboration with nephrology.  CMP today. Urine ALB 150 and A:C > 300.         Other Visit Diagnoses    Vitamin D deficiency       Check Vitamin D level today.   Relevant Orders   VITAMIN D 25 Hydroxy (Vit-D Deficiency, Fractures)   Breast cancer screening       Relevant Orders   MM DIGITAL SCREENING BILATERAL   Colon cancer screening       Relevant Orders   Ambulatory referral to Gastroenterology       Follow up plan: Return in about 2 weeks (around 06/01/2019) for Pain follow-up.

## 2019-05-18 NOTE — Assessment & Plan Note (Addendum)
Chronic, ongoing.  A1C today 7.4%.  Praised for success with diet changes.  Continue current medication regimen and consider reduction if ongoing success.  Continue to collaborate with Dr. [name] and CCM team.  Urine ALB 150 and A:C >300.

## 2019-05-18 NOTE — Assessment & Plan Note (Addendum)
Chronic, ongoing.  BP slightly above goal today, but has not been taking Olmesartan.  Refill sent and education provided.  Continue current medication regimen.  CMP today.  Continue to collaborate with nephrology and cardiology.

## 2019-05-18 NOTE — Assessment & Plan Note (Signed)
Chronic, ongoing.  Continue current medication regimen and collaboration with nephrology.  CMP today. Urine ALB 150 and A:C > 300.

## 2019-05-19 LAB — COMPREHENSIVE METABOLIC PANEL
ALT: 20 IU/L (ref 0–32)
AST: 21 IU/L (ref 0–40)
Albumin/Globulin Ratio: 1.8 (ref 1.2–2.2)
Albumin: 3.6 g/dL — ABNORMAL LOW (ref 3.7–4.7)
Alkaline Phosphatase: 114 IU/L (ref 39–117)
BUN/Creatinine Ratio: 19 (ref 12–28)
BUN: 37 mg/dL — ABNORMAL HIGH (ref 8–27)
Bilirubin Total: <0.2 mg/dL (ref 0.0–1.2)
CO2: 21 mmol/L (ref 20–29)
Calcium: 8.9 mg/dL (ref 8.7–10.3)
Chloride: 109 mmol/L — ABNORMAL HIGH (ref 96–106)
Creatinine, Ser: 1.99 mg/dL — ABNORMAL HIGH (ref 0.57–1.00)
GFR calc Af Amer: 29 mL/min/{1.73_m2} — ABNORMAL LOW (ref >59)
GFR calc non Af Amer: 25 mL/min/{1.73_m2} — ABNORMAL LOW (ref >59)
Globulin, Total: 2 g/dL (ref 1.5–4.5)
Glucose: 180 mg/dL — ABNORMAL HIGH (ref 65–99)
Potassium: 4.8 mmol/L (ref 3.5–5.2)
Sodium: 145 mmol/L — ABNORMAL HIGH (ref 134–144)
Total Protein: 5.6 g/dL — ABNORMAL LOW (ref 6.0–8.5)

## 2019-05-19 LAB — VITAMIN D 25 HYDROXY (VIT D DEFICIENCY, FRACTURES): Vit D, 25-Hydroxy: 26.9 ng/mL — ABNORMAL LOW (ref 30.0–100.0)

## 2019-05-24 ENCOUNTER — Telehealth: Payer: Self-pay

## 2019-05-24 ENCOUNTER — Other Ambulatory Visit: Payer: Self-pay

## 2019-05-24 DIAGNOSIS — Z1211 Encounter for screening for malignant neoplasm of colon: Secondary | ICD-10-CM

## 2019-05-24 NOTE — Telephone Encounter (Signed)
Gastroenterology Pre-Procedure Review  Request Date: 06/06/19 Requesting Physician: Dr. Vicente Males  PATIENT REVIEW QUESTIONS: The patient responded to the following health history questions as indicated:    1. Are you having any GI issues? no 2. Do you have a personal history of Polyps? no 3. Do you have a family history of Colon Cancer or Polyps? no 4. Diabetes Mellitus? yes (type 2) 5. Joint replacements in the past 12 months?no 6. Major health problems in the past 3 months?no 7. Any artificial heart valves, MVP, or defibrillator?no    MEDICATIONS & ALLERGIES:    Patient reports the following regarding taking any anticoagulation/antiplatelet therapy:   Plavix, Coumadin, Eliquis, Xarelto, Lovenox, Pradaxa, Brilinta, or Effient? yes (Plavix Blood Thinner Request Sent to [name] Ca nnady) Aspirin? yes (81 mg)  Patient confirms/reports the following medications:  Current Outpatient Medications  Medication Sig Dispense Refill  . aspirin 81 MG tablet Take 81 mg by mouth daily.    . carvedilol (COREG) 3.125 MG tablet Take 1 tablet (3.125 mg total) by mouth 2 (two) times daily with a meal. 180 tablet 3  . Cholecalciferol (VITAMIN D-3 PO) Take 125 mcg by mouth once a week.     . clopidogrel (PLAVIX) 75 MG tablet TAKE 1 TABLET BY MOUTH DAILY 90 tablet 0  . docusate sodium (COLACE) 100 MG capsule Take 1 capsule (100 mg total) by mouth daily. Do not take if you have loose stools 90 capsule 0  . ferrous sulfate 325 (65 FE) MG EC tablet Take 1 tablet (325 mg total) by mouth 3 (three) times daily with meals. 120 tablet 1  . furosemide (LASIX) 40 MG tablet Take 1 tablet (40 mg total) by mouth daily. 90 tablet 3  . HUMALOG KWIKPEN 100 UNIT/ML KiwkPen TK 12 UNITS BEFORE LUNCH AND 8 UNITS BEFORE SUPPER.  11  . insulin detemir (LEVEMIR) 100 UNIT/ML injection Inject 12 Units into the skin every morning.     [name] NOVOFINE 32G X 6 MM MISC USE AS DIRECTED THREE TIMES DAILY WITH HUMALOG. 100 each 0  . olmesartan  (BENICAR) 40 MG tablet TAKE 1 TABLET(40 MG) BY MOUTH DAILY 90 tablet 1  . omeprazole (PRILOSEC) 20 MG capsule TAKE 1 CAPSULE(20 MG) BY MOUTH TWICE DAILY 180 capsule 0  . ONE TOUCH ULTRA TEST test strip USE TO TEST BLOOD SUGAR THREE TIMES DAILY 100 each 12  . pioglitazone (ACTOS) 15 MG tablet Take 1 tablet (15 mg total) by mouth daily. 30 tablet 11  . pregabalin (LYRICA) 25 MG capsule Take 1 capsule (25 mg total) by mouth daily. 30 capsule 2  . rosuvastatin (CRESTOR) 5 MG tablet Take 1 tablet (5 mg total) by mouth daily. 90 tablet 3   No current facility-administered medications for this visit.     Patient confirms/reports the following allergies:  Allergies  Allergen Reactions  . Atorvastatin     Myalgias   . Gabapentin Other (See Comments)    Speech impairment   . Pravastatin     Myalgias   . Trulicity [Dulaglutide] Hives    No orders of the defined types were placed in this encounter.   AUTHORIZATION INFORMATION Primary Insurance: 1D#: Group #:  Secondary Insurance: 1D#: Group #:  SCHEDULE INFORMATION: Date: 06/22/20Time: Location:[facility]

## 2019-05-25 ENCOUNTER — Ambulatory Visit (INDEPENDENT_AMBULATORY_CARE_PROVIDER_SITE_OTHER): Payer: Medicare Other | Admitting: Pharmacist

## 2019-05-25 ENCOUNTER — Telehealth: Payer: Self-pay

## 2019-05-25 DIAGNOSIS — N184 Chronic kidney disease, stage 4 (severe): Secondary | ICD-10-CM | POA: Diagnosis not present

## 2019-05-25 DIAGNOSIS — I1310 Hypertensive heart and chronic kidney disease without heart failure, with stage 1 through stage 4 chronic kidney disease, or unspecified chronic kidney disease: Secondary | ICD-10-CM | POA: Diagnosis not present

## 2019-05-25 NOTE — Chronic Care Management (AMB) (Signed)
  Chronic Care Management   Follow Up Note   05/25/2019 Name: Tonya [name] MRN: [id number] DOB: 1946/10/19  Referred by: [name], NP Reason for referral : Chronic Care Management (Medication Management)   Tonya [name] is a 73 y.o. year old female who is a primary care patient of Cannady, [name], NP. The CCM team was consulted for assistance with chronic disease management and care coordination needs.    Contacted patient to f/u on picking up olmesartan and pregabalin. She did pick up both medications. Denies any tolerability concerns with pregabalin at this time.   She also asks if we can fax her most recent lab results to Dr. Marcy Salvo office, to avoid having labs drawn again tomorrow.   Review of patient status, including review of consultants reports, relevant laboratory and other test results, and collaboration with appropriate care team members and the patient's provider was performed as part of comprehensive patient evaluation and provision of chronic care management services.    Goals Addressed            This Visit's Progress     Patient Stated   . "I need to watch my blood pressure" (pt-stated)       Current Barriers:  . HTN w/ CKD and HFpEF; followed by cardiology Dr. [name] Situ . Restarted olmesartan 40 mg daily at last office visit, as patient had accidentally stopped taking it. She was also taking carvedilol once daily instead of BID . Patient confirms she started olmesartan QAM and started taking the evening dose of carvedilol . Reports BP readings 105/72, 133/81 156/88, 132/76 . Denies s/sx hypotension - dizziness, shakiness, lightheadedness  Pharmacist Clinical Goal(s):  [name] Over the next 30 days, patient will work with CCM team and cardiology to address needs related to optimized cardiovascular medication management  Interventions: . Comprehensive medication review performed.  . Encouraged to start using BID pill box to help organize medications .  Contacted [facility] pharmacy; asked to transfer olmesartan from [facility] to their pharmacy. Will hold off on transferring Lyrica until after f/u with [name] Cannady and dose is stabilized   Patient Self Care Activities:  . Patient has not been administering all medications as prescribed, but will utilize the twice daily pill box to improve adherence . Patient will continue to check blood pressure periodically to evaluate   Please see past updates related to this goal by clicking on the "Past Updates" button in the selected goal          Plan:  - Will plan to outreach patient in 2-3 weeks to follow up on medication management - Will collaborate with clinical staff to investigate process for faxing labs to Dr. Marcy Salvo.   [name], PharmD Clinical Pharmacist [place] [phone]

## 2019-05-25 NOTE — Patient Instructions (Signed)
Visit Information  Goals Addressed            This Visit's Progress     Patient Stated   . "I need to watch my blood pressure" (pt-stated)       Current Barriers:  . HTN w/ CKD and HFpEF; followed by cardiology Dr. [name] Situ . Restarted olmesartan 40 mg daily at last office visit, as patient had accidentally stopped taking it. She was also taking carvedilol once daily instead of BID . Patient confirms she started olmesartan QAM and started taking the evening dose of carvedilol . Reports BP readings 105/72, 133/81 156/88, 132/76 . Denies s/sx hypotension - dizziness, shakiness, lightheadedness  Pharmacist Clinical Goal(s):  [name] Over the next 30 days, patient will work with CCM team and cardiology to address needs related to optimized cardiovascular medication management  Interventions: . Comprehensive medication review performed.  . Encouraged to start using BID pill box to help organize medications . Contacted [facility] pharmacy; asked to transfer ol[facility]artan from [facility] to [facility] pharmacy. Will hold off on transferring Lyrica until after f/u with [name] Cannady and dose is stabilized   Patient Self Care Activities:  . Patient has not been administering all medications as prescribed, but will utilize the twice daily pill box to improve adherence . Patient will continue to check blood pressure periodically to evaluate   Please see past updates related to this goal by clicking on the "Past Updates" button in the selected goal         The patient verbalized understanding of instructions provided today and declined a print copy of patient instruction materials.   Plan:  - Will plan to outreach patient in 2-3 weeks to follow up on medication management - Will collaborate with clinical staff to investigate process for faxing labs to Dr. Marcy Salvo.   [name], PharmD Clinical Pharmacist [place] [phone]

## 2019-05-26 ENCOUNTER — Telehealth: Payer: Self-pay

## 2019-05-26 NOTE — Telephone Encounter (Signed)
Patient has been advised per Dr. Suezanne Jacquet blood thinner request received to stop Plavix 5 days prior to her 06/06/19 Colonoscopy and to restart 2 days after her procedure.  A copy of this fax has been mailed to her as a reminder.  Thanks [employer]

## 2019-05-30 ENCOUNTER — Other Ambulatory Visit: Payer: Self-pay | Admitting: Nurse Practitioner

## 2019-05-31 ENCOUNTER — Other Ambulatory Visit: Payer: Self-pay | Admitting: Nurse Practitioner

## 2019-06-01 ENCOUNTER — Ambulatory Visit (INDEPENDENT_AMBULATORY_CARE_PROVIDER_SITE_OTHER): Payer: Medicare Other | Admitting: Nurse Practitioner

## 2019-06-01 ENCOUNTER — Encounter: Payer: Self-pay | Admitting: Nurse Practitioner

## 2019-06-01 ENCOUNTER — Other Ambulatory Visit: Payer: Self-pay

## 2019-06-01 DIAGNOSIS — N2581 Secondary hyperparathyroidism of renal origin: Secondary | ICD-10-CM | POA: Diagnosis not present

## 2019-06-01 DIAGNOSIS — D631 Anemia in chronic kidney disease: Secondary | ICD-10-CM | POA: Diagnosis not present

## 2019-06-01 DIAGNOSIS — R8290 Unspecified abnormal findings in urine: Secondary | ICD-10-CM | POA: Diagnosis not present

## 2019-06-01 DIAGNOSIS — B0229 Other postherpetic nervous system involvement: Secondary | ICD-10-CM | POA: Diagnosis not present

## 2019-06-01 DIAGNOSIS — N184 Chronic kidney disease, stage 4 (severe): Secondary | ICD-10-CM | POA: Diagnosis not present

## 2019-06-01 DIAGNOSIS — E1122 Type 2 diabetes mellitus with diabetic chronic kidney disease: Secondary | ICD-10-CM | POA: Diagnosis not present

## 2019-06-01 DIAGNOSIS — I129 Hypertensive chronic kidney disease with stage 1 through stage 4 chronic kidney disease, or unspecified chronic kidney disease: Secondary | ICD-10-CM | POA: Diagnosis not present

## 2019-06-01 NOTE — Patient Instructions (Signed)
Carbohydrate Counting for Diabetes Mellitus, Adult  Carbohydrate counting is a method of keeping track of how many carbohydrates you eat. Eating carbohydrates naturally increases the amount of sugar (glucose) in the blood. Counting how many carbohydrates you eat helps keep your blood glucose within normal limits, which helps you manage your diabetes (diabetes mellitus). It is important to know how many carbohydrates you can safely have in each meal. This is different for every person. A diet and nutrition specialist (registered dietitian) can help you make a meal plan and calculate how many carbohydrates you should have at each meal and snack. Carbohydrates are found in the following foods:  Grains, such as breads and cereals.  Dried beans and soy products.  Starchy vegetables, such as potatoes, peas, and corn.  Fruit and fruit juices.  Milk and yogurt.  Sweets and snack foods, such as cake, cookies, candy, chips, and soft drinks. How do I count carbohydrates? There are two ways to count carbohydrates in food. You can use either of the methods or a combination of both. Reading "Nutrition Facts" on packaged food The "Nutrition Facts" list is included on the labels of almost all packaged foods and beverages in the [place] It includes:  The serving size.  Information about nutrients in each serving, including the grams (g) of carbohydrate per serving. To use the "Nutrition Facts":  Decide how many servings you will have.  Multiply the number of servings by the number of carbohydrates per serving.  The resulting number is the total amount of carbohydrates that you will be having. Learning standard serving sizes of other foods When you eat carbohydrate foods that are not packaged or do not include "Nutrition Facts" on the label, you need to measure the servings in order to count the amount of carbohydrates:  Measure the foods that you will eat with a food scale or measuring cup, if needed.   Decide how many standard-size servings you will eat.  Multiply the number of servings by 15. Most carbohydrate-rich foods have about 15 g of carbohydrates per serving. ? For example, if you eat 8 oz (170 g) of strawberries, you will have eaten 2 servings and 30 g of carbohydrates (2 servings x 15 g = 30 g).  For foods that have more than one food mixed, such as soups and casseroles, you must count the carbohydrates in each food that is included. The following list contains standard serving sizes of common carbohydrate-rich foods. Each of these servings has about 15 g of carbohydrates:   hamburger bun or  English muffin.   oz (15 mL) syrup.   oz (14 g) jelly.  1 slice of bread.  1 six-inch tortilla.  3 oz (85 g) cooked rice or pasta.  4 oz (113 g) cooked dried beans.  4 oz (113 g) starchy vegetable, such as peas, corn, or potatoes.  4 oz (113 g) hot cereal.  4 oz (113 g) mashed potatoes or  of a large baked potato.  4 oz (113 g) canned or frozen fruit.  4 oz (120 mL) fruit juice.  4-6 crackers.  6 chicken nuggets.  6 oz (170 g) unsweetened dry cereal.  6 oz (170 g) plain fat-free yogurt or yogurt sweetened with artificial sweeteners.  8 oz (240 mL) milk.  8 oz (170 g) fresh fruit or one small piece of fruit.  24 oz (680 g) popped popcorn. Example of carbohydrate counting Sample meal  3 oz (85 g) chicken breast.  6 oz (170 g)   brown rice.  4 oz (113 g) corn.  8 oz (240 mL) milk.  8 oz (170 g) strawberries with sugar-free whipped topping. Carbohydrate calculation 1. Identify the foods that contain carbohydrates: ? Rice. ? Corn. ? Milk. ? Strawberries. 2. Calculate how many servings you have of each food: ? 2 servings rice. ? 1 serving corn. ? 1 serving milk. ? 1 serving strawberries. 3. Multiply each number of servings by 15 g: ? 2 servings rice x 15 g = 30 g. ? 1 serving corn x 15 g = 15 g. ? 1 serving milk x 15 g = 15 g. ? 1 serving  strawberries x 15 g = 15 g. 4. Add together all of the amounts to find the total grams of carbohydrates eaten: ? 30 g + 15 g + 15 g + 15 g = 75 g of carbohydrates total. Summary  Carbohydrate counting is a method of keeping track of how many carbohydrates you eat.  Eating carbohydrates naturally increases the amount of sugar (glucose) in the blood.  Counting how many carbohydrates you eat helps keep your blood glucose within normal limits, which helps you manage your diabetes.  A diet and nutrition specialist (registered dietitian) can help you make a meal plan and calculate how many carbohydrates you should have at each meal and snack. This information is not intended to replace advice given to you by your health care provider. Make sure you discuss any questions you have with your health care provider. Document Released: 12/01/2005 Document Revised: 06/10/2017 Document Reviewed: 05/14/2016 Elsevier Interactive Patient Education  2019 Elsevier Inc.  

## 2019-06-01 NOTE — Assessment & Plan Note (Signed)
Chronic, improved with low dose Pregabalin.  Will continue this at night.  Return in 3 months for chronic illness visit or sooner if worsening symptoms.

## 2019-06-01 NOTE — Progress Notes (Signed)
BP (!) 160/99   Pulse 84   Temp 98.4 F (36.9 C) (Oral)   LMP  (LMP Unknown)   SpO2 99%    Subjective:    Patient ID: Tonya [name], female    DOB: 12-Jan-1946, 73 y.o.   MRN: [id number]  HPI: Tonya [name] is a 73 y.o. female  Chief Complaint  Patient presents with  . Pain    2 week f/up   POSTHERPETIC NEURALGIA: Started on Pregabalin 25 MG and she reports she feels she walks "a little better" and sleeps better at night.  Her CrCl recently 28.50.  She sees nephrology.  States her husband stated to her "wow, you are walking a whole lot better with this medication".  Overall she states a decrease in neuralgia pain.  Does report medication makes her tired, so she is taking at night.  Had shingles years ago and since that time has had ongoing issues with pain to lower right back.  She is intolerant to Gabapentin.  Relevant past medical, surgical, family and social history reviewed and updated as indicated. Interim medical history since our last visit reviewed. Allergies and medications reviewed and updated.  Review of Systems  Constitutional: Negative for activity change, appetite change, diaphoresis, fatigue and fever.  Respiratory: Negative for cough, chest tightness and shortness of breath.   Cardiovascular: Negative for chest pain, palpitations and leg swelling.  Gastrointestinal: Negative for abdominal distention, abdominal pain, constipation, diarrhea, nausea and vomiting.  Neurological: Negative for dizziness, syncope, weakness, light-headedness, numbness and headaches.  Psychiatric/Behavioral: Negative.     Per HPI unless specifically indicated above     Objective:    BP (!) 160/99   Pulse 84   Temp 98.4 F (36.9 C) (Oral)   LMP  (LMP Unknown)   SpO2 99%   Wt Readings from Last 3 Encounters:  05/18/19 172 lb 9.6 oz (78.3 kg)  02/22/19 169 lb 14.4 oz (77.1 kg)  02/15/19 171 lb 6 oz (77.7 kg)    Physical Exam Vitals signs and nursing note reviewed.   Constitutional:      General: She is awake. She is not in acute distress.    Appearance: She is well-developed. She is not ill-appearing.  HENT:     Head: Normocephalic.     Right Ear: Hearing normal.     Left Ear: Hearing normal.     Nose: Nose normal.     Mouth/Throat:     Mouth: Mucous membranes are moist.  Eyes:     General: Lids are normal.        Right eye: No discharge.        Left eye: No discharge.     Conjunctiva/sclera: Conjunctivae normal.     Pupils: Pupils are equal, round, and reactive to light.  Neck:     Musculoskeletal: Normal range of motion and neck supple.  Cardiovascular:     Rate and Rhythm: Normal rate and regular rhythm.     Heart sounds: Normal heart sounds. No murmur. No gallop.   Pulmonary:     Effort: Pulmonary effort is normal. No accessory muscle usage or respiratory distress.     Breath sounds: Normal breath sounds.  Abdominal:     General: Bowel sounds are normal.     Palpations: Abdomen is soft. There is no hepatomegaly or splenomegaly.  Musculoskeletal:     Right lower leg: No edema.     Left lower leg: No edema.  Skin:    General: Skin  is warm and dry.     Findings: No rash.  Neurological:     Mental Status: She is alert and oriented to person, place, and time.  Psychiatric:        Attention and Perception: Attention normal.        Mood and Affect: Mood normal.        Behavior: Behavior normal. Behavior is cooperative.        Thought Content: Thought content normal.        Judgment: Judgment normal.     Results for orders placed or performed in visit on 05/18/19  Bayer DCA Hb A1c Waived  Result Value Ref Range   HB A1C (BAYER DCA - WAIVED) 7.4 (H) <7.0 %  Comprehensive metabolic panel  Result Value Ref Range   Glucose 180 (H) 65 - 99 mg/dL   BUN 37 (H) 8 - 27 mg/dL   Creatinine, Ser 1.99 (H) 0.57 - 1.00 mg/dL   GFR calc non Af Amer 25 (L) >59 mL/min/1.73   GFR calc Af Amer 29 (L) >59 mL/min/1.73   BUN/Creatinine Ratio 19 12 -  28   Sodium 145 (H) 134 - 144 mmol/L   Potassium 4.8 3.5 - 5.2 mmol/L   Chloride 109 (H) 96 - 106 mmol/L   CO2 21 20 - 29 mmol/L   Calcium 8.9 8.7 - 10.3 mg/dL   Total Protein 5.6 (L) 6.0 - 8.5 g/dL   Albumin 3.6 (L) 3.7 - 4.7 g/dL   Globulin, Total 2.0 1.5 - 4.5 g/dL   Albumin/Globulin Ratio 1.8 1.2 - 2.2   Bilirubin Total <0.2 0.0 - 1.2 mg/dL   Alkaline Phosphatase 114 39 - 117 IU/L   AST 21 0 - 40 IU/L   ALT 20 0 - 32 IU/L  Lipid Panel Piccolo, Waived  Result Value Ref Range   Cholesterol Piccolo, Waived 165 <200 mg/dL   HDL Chol Piccolo, Waived 77 >59 mg/dL   Triglycerides Piccolo,Waived 78 <150 mg/dL   Chol/HDL Ratio Piccolo,Waive 2.1 mg/dL   LDL Chol Calc Piccolo Waived 73 <100 mg/dL   VLDL Chol Calc Piccolo,Waive 16 <30 mg/dL  Microalbumin, Urine Waived  Result Value Ref Range   Microalb, Ur Waived 150 (H) 0 - 19 mg/L   Creatinine, Urine Waived 100 10 - 300 mg/dL   Microalb/Creat Ratio >300 (H) <30 mg/g  VITAMIN D 25 Hydroxy (Vit-D Deficiency, Fractures)  Result Value Ref Range   Vit D, 25-Hydroxy 26.9 (L) 30.0 - 100.0 ng/mL      Assessment & Plan:   Problem List Items Addressed This Visit      Nervous and Auditory   Post herpetic neuralgia    Chronic, improved with low dose Pregabalin.  Will continue this at night.  Return in 3 months for chronic illness visit or sooner if worsening symptoms.          Follow up plan: Return in about 3 months (around 09/01/2019) for T2DM, HTN/HLD.

## 2019-06-02 ENCOUNTER — Other Ambulatory Visit: Payer: Self-pay

## 2019-06-02 ENCOUNTER — Other Ambulatory Visit
Admission: RE | Admit: 2019-06-02 | Discharge: 2019-06-02 | Disposition: A | Discharge status: Home / Self Care | Payer: Medicare Other | Source: Ambulatory Visit | Attending: Gastroenterology | Admitting: Gastroenterology

## 2019-06-02 ENCOUNTER — Other Ambulatory Visit: Payer: Self-pay | Admitting: Nurse Practitioner

## 2019-06-02 DIAGNOSIS — Z1159 Encounter for screening for other viral diseases: Secondary | ICD-10-CM | POA: Insufficient documentation

## 2019-06-03 ENCOUNTER — Encounter: Payer: Self-pay | Admitting: *Deleted

## 2019-06-03 LAB — NOVEL CORONAVIRUS, NAA (HOSP ORDER, SEND-OUT TO REF LAB; TAT 18-24 HRS): SARS-CoV-2, NAA: NOT DETECTED

## 2019-06-04 ENCOUNTER — Encounter: Payer: Self-pay | Admitting: Anesthesiology

## 2019-06-06 ENCOUNTER — Ambulatory Visit: Payer: Medicare Other | Admitting: Anesthesiology

## 2019-06-06 ENCOUNTER — Ambulatory Visit
Admission: RE | Admit: 2019-06-06 | Discharge: 2019-06-06 | Disposition: A | Discharge status: Home / Self Care | Payer: Medicare Other | Attending: Gastroenterology | Admitting: Gastroenterology

## 2019-06-06 ENCOUNTER — Encounter: Payer: Self-pay | Admitting: *Deleted

## 2019-06-06 ENCOUNTER — Other Ambulatory Visit: Payer: Self-pay

## 2019-06-06 ENCOUNTER — Encounter
Admission: RE | Disposition: A | Discharge status: Home / Self Care | Payer: Self-pay | Source: Home / Self Care | Attending: Gastroenterology

## 2019-06-06 DIAGNOSIS — N184 Chronic kidney disease, stage 4 (severe): Secondary | ICD-10-CM | POA: Diagnosis not present

## 2019-06-06 DIAGNOSIS — Z7902 Long term (current) use of antithrombotics/antiplatelets: Secondary | ICD-10-CM | POA: Diagnosis not present

## 2019-06-06 DIAGNOSIS — Z79899 Other long term (current) drug therapy: Secondary | ICD-10-CM | POA: Insufficient documentation

## 2019-06-06 DIAGNOSIS — I129 Hypertensive chronic kidney disease with stage 1 through stage 4 chronic kidney disease, or unspecified chronic kidney disease: Secondary | ICD-10-CM | POA: Diagnosis not present

## 2019-06-06 DIAGNOSIS — Z7982 Long term (current) use of aspirin: Secondary | ICD-10-CM | POA: Insufficient documentation

## 2019-06-06 DIAGNOSIS — E1122 Type 2 diabetes mellitus with diabetic chronic kidney disease: Secondary | ICD-10-CM | POA: Diagnosis not present

## 2019-06-06 DIAGNOSIS — I130 Hypertensive heart and chronic kidney disease with heart failure and stage 1 through stage 4 chronic kidney disease, or unspecified chronic kidney disease: Secondary | ICD-10-CM | POA: Insufficient documentation

## 2019-06-06 DIAGNOSIS — Z794 Long term (current) use of insulin: Secondary | ICD-10-CM | POA: Insufficient documentation

## 2019-06-06 DIAGNOSIS — Z1211 Encounter for screening for malignant neoplasm of colon: Secondary | ICD-10-CM

## 2019-06-06 DIAGNOSIS — Z8673 Personal history of transient ischemic attack (TIA), and cerebral infarction without residual deficits: Secondary | ICD-10-CM | POA: Diagnosis not present

## 2019-06-06 DIAGNOSIS — I5032 Chronic diastolic (congestive) heart failure: Secondary | ICD-10-CM | POA: Diagnosis not present

## 2019-06-06 DIAGNOSIS — E785 Hyperlipidemia, unspecified: Secondary | ICD-10-CM | POA: Diagnosis not present

## 2019-06-06 DIAGNOSIS — N183 Chronic kidney disease, stage 3 (moderate): Secondary | ICD-10-CM | POA: Insufficient documentation

## 2019-06-06 HISTORY — PX: COLONOSCOPY WITH PROPOFOL: SHX5780

## 2019-06-06 LAB — GLUCOSE, CAPILLARY: Glucose-Capillary: 112 mg/dL — ABNORMAL HIGH (ref 70–99)

## 2019-06-06 SURGERY — COLONOSCOPY WITH PROPOFOL
Anesthesia: General

## 2019-06-06 MED ORDER — SODIUM CHLORIDE 0.9 % IV SOLN
INTRAVENOUS | Status: DC
  Administered 2019-06-06: 08:00:00 via INTRAVENOUS

## 2019-06-06 MED ORDER — LIDOCAINE HCL (CARDIAC) PF 100 MG/5ML IV SOSY
PREFILLED_SYRINGE | INTRAVENOUS | Status: DC | PRN
  Administered 2019-06-06: 60 mg via INTRAVENOUS

## 2019-06-06 MED ORDER — PROPOFOL 10 MG/ML IV BOLUS
INTRAVENOUS | Status: DC | PRN
  Administered 2019-06-06: 60 mg via INTRAVENOUS

## 2019-06-06 MED ORDER — PROPOFOL 500 MG/50ML IV EMUL
INTRAVENOUS | Status: DC | PRN
  Administered 2019-06-06: 120 ug/kg/min via INTRAVENOUS

## 2019-06-06 NOTE — H&P (Signed)
[name], MD [address], [place], [place], [place], [address] [address], [place], [place], [place], [address] Phone: [phone]  Fax: [phone]  Primary Care Physician:  [name], NP   Pre-Procedure History & Physical: HPI:  Tonya [name] is a 73 y.o. female is here for an colonoscopy.   Past Medical History:  Diagnosis Date  . (HFpEF) heart failure with preserved ejection fraction ([place])    a. 05/2017 Echo: EF 55-60%, Gr1 DD, mildly dil LA.  . CKD (chronic kidney disease), stage III ([place])   . Diabetes mellitus without complication ([place])   . Hyperlipidemia   . Hypertension   . Stroke [facility])    a. 08/2017.    Past Surgical History:  Procedure Laterality Date  . COLONOSCOPY  12/15/2006  . TUBAL LIGATION      Prior to Admission medications   Medication Sig Start Date End Date Taking? Authorizing Provider  carvedilol (COREG) 3.125 MG tablet Take 1 tablet (3.125 mg total) by mouth 2 (two) times daily with a meal. 03/29/19  Yes Gollan, Kathlene November, MD  furosemide (LASIX) 40 MG tablet Take 1 tablet (40 mg total) by mouth daily. 12/31/18  Yes Cannady, [name] T, NP  insulin detemir (LEVEMIR) 100 UNIT/ML injection Inject 12 Units into the skin every morning.    Yes [provider]  omeprazole (PRILOSEC) 20 MG capsule TAKE 1 CAPSULE(20 MG) BY MOUTH TWICE DAILY 03/30/19  Yes Cannady, [name] T, NP  pregabalin (LYRICA) 25 MG capsule Take 1 capsule (25 mg total) by mouth daily. 05/18/19  Yes Cannady, [name] T, NP  rosuvastatin (CRESTOR) 5 MG tablet Take 1 tablet (5 mg total) by mouth daily. 03/29/19  Yes [name], MD  aspirin 81 MG tablet Take 81 mg by mouth daily.    [provider]  Cholecalciferol (VITAMIN D-3 PO) Take 125 mcg by mouth once a week.     [provider]  clopidogrel (PLAVIX) 75 MG tablet TAKE 1 TABLET BY MOUTH  DAILY 05/30/19   Cannady, Henrine Screws T, NP  docusate sodium (COLACE) 100 MG capsule Take 1 capsule (100 mg total)  by mouth daily. Do not take if you have loose stools 04/18/19   Earlie Server, MD  ferrous sulfate 325 (65 FE) MG EC tablet Take 1 tablet (325 mg total) by mouth 3 (three) times daily with meals. Patient not taking: Reported on 06/06/2019 04/18/19   Earlie Server, MD  HUMALOG KWIKPEN 100 UNIT/ML KiwkPen TK 12 UNITS BEFORE LUNCH AND 8 UNITS BEFORE SUPPER. 05/20/18   [provider]  NOVOFINE 32G X 6 MM MISC USE AS DIRECTED THREE TIMES DAILY WITH HUMALOG 06/02/19   Cannady, [name] T, NP  olmesartan (BENICAR) 40 MG tablet TAKE 1 TABLET(40 MG) BY MOUTH DAILY 05/18/19   Cannady, Henrine Screws T, NP  ONE TOUCH ULTRA TEST test strip USE TO TEST BLOOD SUGAR THREE TIMES DAILY 07/21/17   Kathrine Haddock, NP  pioglitazone (ACTOS) 15 MG tablet Take 1 tablet (15 mg total) by mouth daily. Patient not taking: Reported on 06/06/2019 12/14/18   [name] T, NP    Allergies as of 05/24/2019 - Review Complete 05/18/2019  Allergen Reaction Noted  . Atorvastatin  09/03/2017  . Gabapentin Other (See Comments) 06/30/2018  . Pravastatin  09/03/2017  . Trulicity [dulaglutide] Hives 05/01/2015    Family History  Problem Relation Age of Onset  . Diabetes Mother   . Stroke Mother   . Hyperlipidemia Father   .  Hypertension Father   . Diabetes Sister   . Diabetes Brother   . Gout Son   . Diabetes Brother   . Kidney disease Son   . Lymphoma Sister   . Heart attack Sister   . Breast cancer Neg Hx     Social History   Socioeconomic History  . Marital status: Married    Spouse name: Not on file  . Number of children: Not on file  . Years of education: Not on file  . Highest education level: GED or equivalent  Occupational History  . Occupation: retired  Scientific laboratory technician  . Financial resource strain: Not hard at all  . Food insecurity    Worry: Never true    Inability: Never true  . Transportation needs    Medical: No    Non-medical: No  Tobacco Use  . Smoking status: Never Smoker  . Smokeless tobacco: Never Used   Substance and Sexual Activity  . Alcohol use: No    Alcohol/week: 0.0 standard drinks  . Drug use: No  . Sexual activity: Yes  Lifestyle  . Physical activity    Days per week: 0 days    Minutes per session: 0 min  . Stress: Not at all  Relationships  . Social connections    Talks on phone: More than three times a week    Gets together: More than three times a week    Attends religious service: More than 4 times per year    Active member of club or organization: Yes    Attends meetings of clubs or organizations: More than 4 times per year    Relationship status: Married  . Intimate partner violence    Fear of current or ex partner: No    Emotionally abused: No    Physically abused: No    Forced sexual activity: No  Other Topics Concern  . Not on file  Social History Narrative  . Not on file    Review of Systems: See HPI, otherwise negative ROS  Physical Exam: BP (!) 149/79   Pulse 73   Temp (!) 96 F (35.6 C) (Tympanic)   Resp 16   Ht 5\' 7"  (1.702 m)   Wt 78.9 kg   LMP  (LMP Unknown)   SpO2 100%   BMI 27.25 kg/m  General:   Alert,  pleasant and cooperative in NAD Head:  Normocephalic and atraumatic. Neck:  Supple; no masses or thyromegaly. Lungs:  Clear throughout to auscultation, normal respiratory effort.    Heart:  +S1, +S2, Regular rate and rhythm, No edema. Abdomen:  Soft, nontender and nondistended. Normal bowel sounds, without guarding, and without rebound.   Neurologic:  Alert and  oriented x4;  grossly normal neurologically.  Impression/Plan: Tonya [name] is here for an colonoscopy to be performed for Screening colonoscopy average risk   Risks, benefits, limitations, and alternatives regarding  colonoscopy have been reviewed with the patient.  Questions have been answered.  All parties agreeable.   [name], MD  06/06/2019, 8:37 AM

## 2019-06-06 NOTE — Anesthesia Post-op Follow-up Note (Signed)
Anesthesia QCDR form completed.        

## 2019-06-06 NOTE — Anesthesia Postprocedure Evaluation (Signed)
Anesthesia Post Note  Patient: Tonya [name]  Procedure(s) Performed: COLONOSCOPY WITH PROPOFOL (N/A )  Patient location during evaluation: Endoscopy Anesthesia Type: General Level of consciousness: awake and alert Pain management: pain level controlled Vital Signs Assessment: post-procedure vital signs reviewed and stable Respiratory status: spontaneous breathing, nonlabored ventilation, respiratory function stable and patient connected to nasal cannula oxygen Cardiovascular status: blood pressure returned to baseline and stable Postop Assessment: no apparent nausea or vomiting Anesthetic complications: no     Last Vitals:  Vitals:   06/06/19 0854 06/06/19 0855  BP: (!) 121/57 (!) 121/57  Pulse:  64  Resp:  14  Temp: (!) 36.3 C (!) 36.3 C  SpO2:  100%    Last Pain:  Vitals:   06/06/19 0904  TempSrc:   PainSc: 0-No pain                 Laqueshia Cihlar S

## 2019-06-06 NOTE — Anesthesia Postprocedure Evaluation (Signed)
Anesthesia Post Note  Patient: Tonya [name]  Procedure(s) Performed: COLONOSCOPY WITH PROPOFOL (N/A )  Patient location during evaluation: Endoscopy Anesthesia Type: General Level of consciousness: awake and alert Pain management: pain level controlled Vital Signs Assessment: post-procedure vital signs reviewed and stable Respiratory status: spontaneous breathing, nonlabored ventilation, respiratory function stable and patient connected to nasal cannula oxygen Cardiovascular status: blood pressure returned to baseline and stable Postop Assessment: no apparent nausea or vomiting Anesthetic complications: no     Last Vitals:  Vitals:   06/06/19 0854 06/06/19 0855  BP: (!) 121/57 (!) 121/57  Pulse:  64  Resp:  14  Temp: (!) 36.3 C (!) 36.3 C  SpO2:  100%    Last Pain:  Vitals:   06/06/19 0904  TempSrc:   PainSc: 0-No pain                 Elizzie Westergard S

## 2019-06-06 NOTE — Progress Notes (Signed)
Cancelled due to poor prep.

## 2019-06-06 NOTE — Anesthesia Preprocedure Evaluation (Addendum)
Anesthesia Evaluation  Patient identified by MRN, date of birth, ID band Patient awake    Reviewed: Allergy & Precautions, NPO status , Patient's Chart, lab work & pertinent test results, reviewed documented beta blocker date and time   Airway Mallampati: III  TM Distance: >3 FB     Dental  (+) Chipped   Pulmonary           Cardiovascular hypertension, Pt. on medications and Pt. on home beta blockers      Neuro/Psych PSYCHIATRIC DISORDERS Anxiety Depression CVA    GI/Hepatic   Endo/Other  diabetes, Type 2  Renal/GU Renal disease     Musculoskeletal   Abdominal   Peds  Hematology  (+) anemia ,   Anesthesia Other Findings Echo shows 55-60 2 yr ago.  Reproductive/Obstetrics                             Anesthesia Physical Anesthesia Plan  ASA: III  Anesthesia Plan: General   Post-op Pain Management:    Induction: Intravenous  PONV Risk Score and Plan:   Airway Management Planned:   Additional Equipment:   Intra-op Plan:   Post-operative Plan:   Informed Consent: I have reviewed the patients History and Physical, chart, labs and discussed the procedure including the risks, benefits and alternatives for the proposed anesthesia with the patient or authorized representative who has indicated his/her understanding and acceptance.       Plan Discussed with: CRNA  Anesthesia Plan Comments:         Anesthesia Quick Evaluation

## 2019-06-06 NOTE — Transfer of Care (Signed)
Immediate Anesthesia Transfer of Care Note  Patient: [name]  Procedure(s) Performed: COLONOSCOPY WITH PROPOFOL (N/A )  Patient Location: PACU  Anesthesia Type:General  Level of Consciousness: sedated  Airway & Oxygen Therapy: Patient Spontanous Breathing and Patient connected to nasal cannula oxygen  Post-op Assessment: Report given to RN and Post -op Vital signs reviewed and stable  Post vital signs: Reviewed and stable  Last Vitals:  Vitals Value Taken Time  BP 121/57 06/06/19 0855  Temp 36.3 C 06/06/19 0855  Pulse 64 06/06/19 0855  Resp 14 06/06/19 0855  SpO2 100 % 06/06/19 0855    Last Pain:  Vitals:   06/06/19 0854  TempSrc: Tympanic         Complications: No apparent anesthesia complications

## 2019-06-06 NOTE — Op Note (Signed)
[facility] Gastroenterology Patient Name: Tonya [name] Procedure Date: 06/06/2019 8:42 AM MRN: [id number] Account #: [id number] Date of Birth: 11-11-1946 Admit Type: Outpatient Age: 73 Room: [facility] ENDO ROOM 4 Gender: Female Note Status: Finalized Procedure:            Colonoscopy Indications:          Screening for colorectal malignant neoplasm Providers:            [name] MD, MD Medicines:            Monitored Anesthesia Care Complications:        No immediate complications. Procedure:            Pre-Anesthesia Assessment:                       - Prior to the procedure, a History and Physical was                        performed, and patient medications, allergies and                        sensitivities were reviewed. The patient's tolerance of                        previous anesthesia was reviewed.                       - The risks and benefits of the procedure and the                        sedation options and risks were discussed with the                        patient. All questions were answered and informed                        consent was obtained.                       - The risks and benefits of the procedure and the                        sedation options and risks were discussed with the                        patient. All questions were answered and informed                        consent was obtained.                       - ASA Grade Assessment: II - A patient with mild                        systemic disease.                       After obtaining informed consent, the colonoscope was                        passed under direct vision. Throughout the procedure,  the patient's blood pressure, pulse, and oxygen                        saturations were monitored continuously. The                        Colonoscope was introduced through the anus with the                        intention of advancing to the cecum. The  scope was                        advanced to the sigmoid colon before the procedure was                        aborted. Medications were given. The colonoscopy was                        performed without difficulty. The patient tolerated the                        procedure well. The quality of the bowel preparation                        was inadequate. Findings:      The perianal and digital rectal examinations were normal.      A moderate amount of semi-solid stool was found in the rectum and in the       sigmoid colon, interfering with visualization. Impression:           - Preparation of the colon was inadequate.                       - Stool in the rectum and in the sigmoid colon.                       - No specimens collected. Recommendation:       - Discharge patient to home (with escort).                       - Resume previous diet.                       - Continue present medications.                       - Repeat colonoscopy in 2 weeks because the bowel                        preparation was suboptimal. Procedure Code(s):    --- Professional ---                       [phone], 53, Colonoscopy, flexible; diagnostic, including                        collection of specimen(s) by brushing or washing, when                        performed (separate procedure) Diagnosis Code(s):    --- Professional ---  Z12.11, Encounter for screening for malignant neoplasm                        of colon CPT copyright 2019 American Medical Association. All rights reserved. The codes documented in this report are preliminary and upon coder review may  be revised to meet current compliance requirements. [name], MD [name] MD, MD 06/06/2019 8:51:31 AM This report has been signed electronically. Number of Addenda: 0 Note Initiated On: 06/06/2019 8:42 AM Total Procedure Duration: 0 hours 1 minute 51 seconds  Estimated Blood Loss: Estimated blood loss: none.      [facility]

## 2019-06-07 ENCOUNTER — Encounter: Payer: Self-pay | Admitting: Gastroenterology

## 2019-06-08 ENCOUNTER — Ambulatory Visit: Payer: Self-pay | Admitting: Pharmacist

## 2019-06-08 ENCOUNTER — Telehealth: Payer: Self-pay | Admitting: Gastroenterology

## 2019-06-08 ENCOUNTER — Telehealth: Payer: Self-pay

## 2019-06-08 ENCOUNTER — Ambulatory Visit: Payer: Medicare Other | Admitting: Gastroenterology

## 2019-06-08 DIAGNOSIS — E1165 Type 2 diabetes mellitus with hyperglycemia: Secondary | ICD-10-CM | POA: Diagnosis not present

## 2019-06-08 DIAGNOSIS — Z794 Long term (current) use of insulin: Secondary | ICD-10-CM | POA: Diagnosis not present

## 2019-06-08 NOTE — Telephone Encounter (Signed)
Patient called & needs another colonoscopy. Please call & schedule.

## 2019-06-08 NOTE — Chronic Care Management (AMB) (Signed)
  Chronic Care Management   Note  06/08/2019 Name: CLYDEAN POSAS MRN: [id number] DOB: 05-Feb-1946  SANAIA JASSO is a 73 y.o. year old female who is a primary care patient of Cannady, [name], NP. The CCM team was consulted for assistance with chronic disease management and care coordination needs.    Attempted to contact patient to discuss blood sugar control, medication management prior to endocrinology appointment on 06/15/2019. Was unable to leave a message on either listed phone.   Follow up plan: -If I do not hear back from patient, will follow up in the next 3-4 weeks to discuss/support any medication management changes made at endocrinologist  [name], PharmD Clinical Pharmacist [place] [phone]

## 2019-06-13 NOTE — Telephone Encounter (Signed)
Spoke with pt to schedule her repeat colonoscopy. Pt has now decided to hold off on scheduling until she speaks with her insurance company about the coverage of a second colonoscopy. Pt plans to contact our office with her decision.

## 2019-06-15 DIAGNOSIS — N183 Chronic kidney disease, stage 3 (moderate): Secondary | ICD-10-CM | POA: Diagnosis not present

## 2019-06-15 DIAGNOSIS — E1121 Type 2 diabetes mellitus with diabetic nephropathy: Secondary | ICD-10-CM | POA: Diagnosis not present

## 2019-06-15 DIAGNOSIS — I10 Essential (primary) hypertension: Secondary | ICD-10-CM | POA: Diagnosis not present

## 2019-06-15 DIAGNOSIS — E113393 Type 2 diabetes mellitus with moderate nonproliferative diabetic retinopathy without macular edema, bilateral: Secondary | ICD-10-CM | POA: Diagnosis not present

## 2019-06-15 DIAGNOSIS — E1122 Type 2 diabetes mellitus with diabetic chronic kidney disease: Secondary | ICD-10-CM | POA: Diagnosis not present

## 2019-06-24 ENCOUNTER — Inpatient Hospital Stay: Payer: Medicare Other | Attending: Oncology

## 2019-06-24 ENCOUNTER — Other Ambulatory Visit: Payer: Self-pay

## 2019-06-24 DIAGNOSIS — M25559 Pain in unspecified hip: Secondary | ICD-10-CM | POA: Diagnosis not present

## 2019-06-24 DIAGNOSIS — R0609 Other forms of dyspnea: Secondary | ICD-10-CM | POA: Diagnosis not present

## 2019-06-24 DIAGNOSIS — D631 Anemia in chronic kidney disease: Secondary | ICD-10-CM | POA: Diagnosis not present

## 2019-06-24 DIAGNOSIS — N184 Chronic kidney disease, stage 4 (severe): Secondary | ICD-10-CM | POA: Diagnosis not present

## 2019-06-24 DIAGNOSIS — R5383 Other fatigue: Secondary | ICD-10-CM | POA: Insufficient documentation

## 2019-06-24 DIAGNOSIS — D500 Iron deficiency anemia secondary to blood loss (chronic): Secondary | ICD-10-CM

## 2019-06-24 DIAGNOSIS — D509 Iron deficiency anemia, unspecified: Secondary | ICD-10-CM | POA: Diagnosis not present

## 2019-06-24 LAB — CBC WITH DIFFERENTIAL/PLATELET
Abs Immature Granulocytes: 0.03 10*3/uL (ref 0.00–0.07)
Basophils Absolute: 0 10*3/uL (ref 0.0–0.1)
Basophils Relative: 1 %
Eosinophils Absolute: 0.2 10*3/uL (ref 0.0–0.5)
Eosinophils Relative: 3 %
HCT: 31.3 % — ABNORMAL LOW (ref 36.0–46.0)
Hemoglobin: 9.7 g/dL — ABNORMAL LOW (ref 12.0–15.0)
Immature Granulocytes: 1 %
Lymphocytes Relative: 27 %
Lymphs Abs: 1.7 10*3/uL (ref 0.7–4.0)
MCH: 27 pg (ref 26.0–34.0)
MCHC: 31 g/dL (ref 30.0–36.0)
MCV: 87.2 fL (ref 80.0–100.0)
Monocytes Absolute: 0.4 10*3/uL (ref 0.1–1.0)
Monocytes Relative: 6 %
Neutro Abs: 3.9 10*3/uL (ref 1.7–7.7)
Neutrophils Relative %: 62 %
Platelets: 275 10*3/uL (ref 150–400)
RBC: 3.59 MIL/uL — ABNORMAL LOW (ref 3.87–5.11)
RDW: 15.7 % — ABNORMAL HIGH (ref 11.5–15.5)
WBC: 6.2 10*3/uL (ref 4.0–10.5)
nRBC: 0 % (ref 0.0–0.2)

## 2019-06-24 LAB — COMPREHENSIVE METABOLIC PANEL
ALT: 12 U/L (ref 0–44)
AST: 16 U/L (ref 15–41)
Albumin: 3.4 g/dL — ABNORMAL LOW (ref 3.5–5.0)
Alkaline Phosphatase: 96 U/L (ref 38–126)
Anion gap: 7 (ref 5–15)
BUN: 51 mg/dL — ABNORMAL HIGH (ref 8–23)
CO2: 23 mmol/L (ref 22–32)
Calcium: 8.8 mg/dL — ABNORMAL LOW (ref 8.9–10.3)
Chloride: 112 mmol/L — ABNORMAL HIGH (ref 98–111)
Creatinine, Ser: 2.24 mg/dL — ABNORMAL HIGH (ref 0.44–1.00)
GFR calc Af Amer: 25 mL/min — ABNORMAL LOW (ref >60)
GFR calc non Af Amer: 21 mL/min — ABNORMAL LOW (ref >60)
Glucose, Bld: 306 mg/dL — ABNORMAL HIGH (ref 70–99)
Potassium: 4.3 mmol/L (ref 3.5–5.1)
Sodium: 142 mmol/L (ref 135–145)
Total Bilirubin: 0.4 mg/dL (ref 0.3–1.2)
Total Protein: 6.3 g/dL — ABNORMAL LOW (ref 6.5–8.1)

## 2019-06-24 LAB — IRON AND TIBC
Iron: 52 ug/dL (ref 28–170)
Saturation Ratios: 17 % (ref 10.4–31.8)
TIBC: 312 ug/dL (ref 250–450)
UIBC: 260 ug/dL

## 2019-06-24 LAB — FERRITIN: Ferritin: 93 ng/mL (ref 11–307)

## 2019-06-27 ENCOUNTER — Inpatient Hospital Stay (HOSPITAL_BASED_OUTPATIENT_CLINIC_OR_DEPARTMENT_OTHER): Payer: Medicare Other | Admitting: Oncology

## 2019-06-27 ENCOUNTER — Other Ambulatory Visit: Payer: Self-pay

## 2019-06-27 ENCOUNTER — Encounter: Payer: Self-pay | Admitting: Oncology

## 2019-06-27 ENCOUNTER — Inpatient Hospital Stay: Payer: Medicare Other

## 2019-06-27 VITALS — BP 138/85 | HR 95 | Temp 97.2°F | Wt 174.2 lb

## 2019-06-27 DIAGNOSIS — R0609 Other forms of dyspnea: Secondary | ICD-10-CM

## 2019-06-27 DIAGNOSIS — D509 Iron deficiency anemia, unspecified: Secondary | ICD-10-CM

## 2019-06-27 DIAGNOSIS — R5383 Other fatigue: Secondary | ICD-10-CM

## 2019-06-27 DIAGNOSIS — M25559 Pain in unspecified hip: Secondary | ICD-10-CM | POA: Diagnosis not present

## 2019-06-27 DIAGNOSIS — N184 Chronic kidney disease, stage 4 (severe): Secondary | ICD-10-CM

## 2019-06-27 DIAGNOSIS — D500 Iron deficiency anemia secondary to blood loss (chronic): Secondary | ICD-10-CM

## 2019-06-27 DIAGNOSIS — D631 Anemia in chronic kidney disease: Secondary | ICD-10-CM | POA: Diagnosis not present

## 2019-06-27 NOTE — Progress Notes (Signed)
Hematology/Oncology Consult note [facility] Telephone:([phone] [phone]   Patient Care Team: [name], NP as PCP - General (Nurse Practitioner) [name], MD as PCP - Cardiology (Cardiology) [name] Betsey Holiday, MD as Physician Assistant (Endocrinology) Lavonia Dana, MD as Consulting Physician (Nephrology) [name], MD as Consulting Physician (Cardiology) De Hollingshead, [facility] as Pharmacist  REFERRING PROVIDER: Arlice Colt CHIEF COMPLAINTS/REASON FOR VISIT:  Follow-up for anemia  HISTORY OF PRESENTING ILLNESS:  Tonya [name] is a  73 y.o.  female with PMH listed below who was referred to me for evaluation of anemia Reviewed patient's recent labs that was done at [facility] office. Labs revealed anemia with hemoglobin of 8.9, MCV 82, normal platelet and wbc  Reviewed patient's previous labs ordered by primary care physician's office, anemia is chronic onset , duration is since 2017 No aggravating or improving factors.  Associated signs and symptoms: Patient reports fatigue.  SOB with exertion.  Denies weight loss, easy bruising, hematochezia, hemoptysis, hematuria. Context: History of GI bleeding: denies               History of Chronic kidney disease: CKD, follows up with Dr.Kolluru.                History of autoimmune disease: denies               History of hemolytic anemia. denies               Last colonoscopy: never had colonoscopy done.   INTERVAL HISTORY Tonya [name] is a 73 y.o. female who has above history reviewed by me today presents for follow up visit for management of anemia Problems and complaints are listed below: During the interval, patient has establish care with gastroenterology. He underwent colonoscopy on 06/06/2019.  Due to poor preparation, patient was scheduled to repeat colonoscopy due to suboptimal bowel preparation.  Patient told me that she is waiting for insurance approval for repeat  procedure.  Overall she feels well at baseline.  Fatigue is at baseline. She feels that her hip pain is her biggest issue for now.   Review of Systems  Constitutional: Positive for fatigue. Negative for appetite change, chills and fever.  HENT:   Negative for hearing loss and voice change.   Eyes: Negative for eye problems.  Respiratory: Negative for chest tightness and cough.   Cardiovascular: Negative for chest pain.  Gastrointestinal: Negative for abdominal distention, abdominal pain and blood in stool.  Endocrine: Negative for hot flashes.  Genitourinary: Negative for difficulty urinating and frequency.   Musculoskeletal: Negative for arthralgias.  Skin: Negative for itching and rash.  Neurological: Negative for extremity weakness.  Hematological: Negative for adenopathy.  Psychiatric/Behavioral: Negative for confusion.    MEDICAL HISTORY:  Past Medical History:  Diagnosis Date   (HFpEF) heart failure with preserved ejection fraction ([place])    a. 05/2017 Echo: EF 55-60%, Gr1 DD, mildly dil LA.   CKD (chronic kidney disease), stage III ([place])    Diabetes mellitus without complication ([place])    Hyperlipidemia    Hypertension    Stroke ([place])    a. 08/2017.    SURGICAL HISTORY: Past Surgical History:  Procedure Laterality Date   COLONOSCOPY  12/15/2006   COLONOSCOPY WITH PROPOFOL N/A 06/06/2019   Procedure: COLONOSCOPY WITH PROPOFOL;  Surgeon: [name], MD;  Location: [facility] ENDOSCOPY;  Service: Gastroenterology;  Laterality: N/A;   TUBAL LIGATION      SOCIAL HISTORY: Social History  Socioeconomic History   Marital status: Married    Spouse name: Not on file   Number of children: Not on file   Years of education: Not on file   Highest education level: GED or equivalent  Occupational History   Occupation: retired  Scientist, product/process development strain: Not hard at [employer]    Worry: Never true    Inability: Never true    Transportation needs    Medical: No    Non-medical: No  Tobacco Use   Smoking status: Never Smoker   Smokeless tobacco: Never Used  Substance and Sexual Activity   Alcohol use: No    Alcohol/week: 0.0 standard drinks   Drug use: No   Sexual activity: Yes  Lifestyle   Physical activity    Days per week: 0 days    Minutes per session: 0 min   Stress: Not at all  Relationships   Social connections    Talks on phone: More than three times a week    Gets together: More than three times a week    Attends religious service: More than 4 times per year    Active member of club or organization: Yes    Attends meetings of clubs or organizations: More than 4 times per year    Relationship status: Married   Intimate partner violence    Fear of current or ex partner: No    Emotionally abused: No    Physically abused: No    Forced sexual activity: No  Other Topics Concern   Not on file  Social History Narrative   Not on file    FAMILY HISTORY: Family History  Problem Relation Age of Onset   Diabetes Mother    Stroke Mother    Hyperlipidemia Father    Hypertension Father    Diabetes Sister    Diabetes Brother    Gout Son    Diabetes Brother    Kidney disease Son    Lymphoma Sister    Heart attack Sister    Breast cancer Neg Hx     ALLERGIES:  is allergic to atorvastatin; gabapentin; pravastatin; and trulicity [dulaglutide].  MEDICATIONS:  Current Outpatient Medications  Medication Sig Dispense Refill   aspirin 81 MG tablet Take 81 mg by mouth daily.     carvedilol (COREG) 3.125 MG tablet Take 1 tablet (3.125 mg total) by mouth 2 (two) times daily with a meal. 180 tablet 3   Cholecalciferol (VITAMIN D-3 PO) Take 125 mcg by mouth once a week.      clopidogrel (PLAVIX) 75 MG tablet TAKE 1 TABLET BY MOUTH  DAILY 90 tablet 0   docusate sodium (COLACE) 100 MG capsule Take 1 capsule (100 mg total) by mouth daily. Do not take if you have loose stools  90 capsule 0   ferrous sulfate 325 (65 FE) MG EC tablet Take 1 tablet (325 mg total) by mouth 3 (three) times daily with meals. 120 tablet 1   furosemide (LASIX) 40 MG tablet Take 1 tablet (40 mg total) by mouth daily. 90 tablet 3   HUMALOG KWIKPEN 100 UNIT/ML KiwkPen TK 12 UNITS BEFORE LUNCH AND 8 UNITS BEFORE SUPPER.  11   insulin detemir (LEVEMIR) 100 UNIT/ML injection Inject 12 Units into the skin every morning.      NOVOFINE 32G X 6 MM MISC USE AS DIRECTED THREE TIMES DAILY WITH HUMALOG 100 each 3   olmesartan (BENICAR) 40 MG tablet TAKE 1 TABLET(40  MG) BY MOUTH DAILY 90 tablet 1   omeprazole (PRILOSEC) 20 MG capsule TAKE 1 CAPSULE(20 MG) BY MOUTH TWICE DAILY 180 capsule 0   ONE TOUCH ULTRA TEST test strip USE TO TEST BLOOD SUGAR THREE TIMES DAILY 100 each 12   pioglitazone (ACTOS) 15 MG tablet Take 1 tablet (15 mg total) by mouth daily. 30 tablet 11   pregabalin (LYRICA) 25 MG capsule Take 1 capsule (25 mg total) by mouth daily. 30 capsule 2   rosuvastatin (CRESTOR) 5 MG tablet Take 1 tablet (5 mg total) by mouth daily. 90 tablet 3   No current facility-administered medications for this visit.      PHYSICAL EXAMINATION: ECOG PERFORMANCE STATUS: 1 - Symptomatic but completely ambulatory Vitals:   06/27/19 1350  BP: 138/85  Pulse: 95  Temp: (!) 97.2 F (36.2 C)   Filed Weights   06/27/19 1350  Weight: 174 lb 4 oz (79 kg)    Physical Exam Constitutional:      General: She is not in acute distress. HENT:     Head: Normocephalic and atraumatic.  Eyes:     General: No scleral icterus.    Pupils: Pupils are equal, round, and reactive to light.  Neck:     Musculoskeletal: Normal range of motion and neck supple.  Cardiovascular:     Rate and Rhythm: Normal rate and regular rhythm.     Heart sounds: Normal heart sounds.  Pulmonary:     Effort: Pulmonary effort is normal. No respiratory distress.     Breath sounds: No wheezing.  Abdominal:     General: Bowel  sounds are normal. There is no distension.     Palpations: Abdomen is soft. There is no mass.     Tenderness: There is no abdominal tenderness.  Musculoskeletal: Normal range of motion.        General: No deformity.  Skin:    General: Skin is warm and dry.     Findings: No erythema or rash.  Neurological:     Mental Status: She is alert and oriented to person, place, and time.     Cranial Nerves: No cranial nerve deficit.     Coordination: Coordination normal.  Psychiatric:        Behavior: Behavior normal.        Thought Content: Thought content normal.      LABORATORY DATA:  I have reviewed the data as listed Lab Results  Component Value Date   WBC 6.2 06/24/2019   HGB 9.7 (L) 06/24/2019   HCT 31.3 (L) 06/24/2019   MCV 87.2 06/24/2019   PLT 275 06/24/2019   Recent Labs    12/30/18 1012 02/15/19 1405 05/18/19 1105 06/24/19 1331  NA  --  143 145* 142  K  --  5.3* 4.8 4.3  CL  --  108* 109* 112*  CO2  --  21 21 23   GLUCOSE  --  233* 180* 306*  BUN  --  43* 37* 51*  CREATININE  --  2.23* 1.99* 2.24*  CALCIUM  --  9.4 8.9 8.8*  GFRNONAA  --  22* 25* 21*  GFRAA  --  25* 29* 25*  PROT 6.2* 6.1 5.6* 6.3*  ALBUMIN 3.3* 3.7 3.6* 3.4*  AST 22 14 21 16   ALT 14 11 20 12   ALKPHOS 93 92 114 96  BILITOT 0.7 <0.2 <0.2 0.4  BILIDIR <0.1  --   --   --   IBILI NOT CALCULATED  --   --   --  Iron/TIBC/Ferritin/ %Sat    Component Value Date/Time   IRON 52 06/24/2019 1331   IRON 48 02/15/2019 1405   TIBC 312 06/24/2019 1331   TIBC 438 02/15/2019 1405   FERRITIN 93 06/24/2019 1331   FERRITIN 15 02/15/2019 1405   IRONPCTSAT 17 06/24/2019 1331   IRONPCTSAT 11 (L) 02/15/2019 1405        ASSESSMENT & PLAN:  1. Iron deficiency anemia due to chronic blood loss   2. Anemia due to stage 4 chronic kidney disease ([place])   3. Chronic kidney disease, stage 4, severely decreased GFR ([facility])    #Labs are reviewed and discussed with patient. Iron deficiency anemia, hemoglobin  9.7, MCV 87 Iron panel shows ferritin of 93 slightly decreased compared to 8-month ago.  Iron saturation borderline at 17. We discussed about additional IV iron treatments. Patient is worried about financial toxicities. She declines IV iron treatment for now. She agrees to continue oral iron supplementation ferrous sulfate 325 mg twice daily. She has some constipation secondary to iron tablets. Recommend patient to use Colace daily for constipation  .#Anemia secondary to CKD. CKD also contributes to her anemia.  I explained to patient that I recommend her iron store to be further optimized before starting erythropoietin treatments.  Patient is not interested in starting IV iron treatments.  We will continue oral iron supplementation twice daily.  Plan starting Retacrit at next visit. Discussed with patient about the rationale of erythropoietin therapy and side effects including not limited to thrombosis events, stroke, heart attack, progression of underlying cancer, etc. Patient understands and willing to proceed.  I would wait till she has colonoscopy to rule out any colon mass prior to starting erythropoietin replacement. Patient was advised to call and update [place] after she has her schedule for colonoscopy.   Orders Placed This Encounter  Procedures   CBC    Standing Status:   Future    Standing Expiration Date:   06/26/2020   Retic Panel    Standing Status:   Future    Standing Expiration Date:   06/26/2020   Iron and TIBC    Standing Status:   Future    Standing Expiration Date:   06/26/2020   Ferritin    Standing Status:   Future    Standing Expiration Date:   06/26/2020    All questions were answered. The patient knows to call the clinic with any problems questions or concerns.  Return of visit: 3 months    Earlie Server, MD, PhD Hematology Oncology [facility] Pager- [phone] 06/27/2019

## 2019-07-01 ENCOUNTER — Other Ambulatory Visit: Payer: Self-pay

## 2019-07-01 NOTE — Patient Outreach (Signed)
[facility]) Care Management  07/01/2019  Tonya [name] [birth year] [id number]   Medication Adherence call to Tonya [name] [place] Identifiers Verify spoke with patient she is past due on Rosuvastatin 5 mg patient explain she takes 1 tablet daily her daughter set her pill box, patient has medication at this time. Tonya [name] is showing past due under [place].   [place] Management Direct Dial [phone]  Fax [phone] [email or web address]

## 2019-07-06 ENCOUNTER — Ambulatory Visit: Payer: Self-pay | Admitting: Pharmacist

## 2019-07-06 ENCOUNTER — Ambulatory Visit (INDEPENDENT_AMBULATORY_CARE_PROVIDER_SITE_OTHER): Payer: Medicare Other

## 2019-07-06 ENCOUNTER — Telehealth: Payer: Self-pay

## 2019-07-06 VITALS — BP 147/97 | Ht 67.0 in | Wt 174.0 lb

## 2019-07-06 DIAGNOSIS — Z0000 Encounter for general adult medical examination without abnormal findings: Secondary | ICD-10-CM

## 2019-07-06 NOTE — Progress Notes (Signed)
Subjective:   Tonya [name] is a 73 y.o. female who presents for Medicare Annual (Subsequent) preventive examination.  This visit is being conducted via phone call  - after an attmept to do on video chat - due to the COVID-19 pandemic. This patient has given me verbal consent via phone to conduct this visit, patient states they are participating from their home address. Some vital signs may be absent or patient reported.   Patient identification: identified by name, DOB, and current address.     Review of Systems:   Cardiac Risk Factors include: advanced age (>33men, >70 women);hypertension;diabetes mellitus;dyslipidemia     Objective:     Vitals: BP (!) 147/97 Comment: patient reported  Ht 5\' 7"  (1.702 m) Comment: patient reported  Wt 174 lb (78.9 kg) Comment: patient reported  LMP  (LMP Unknown)   BMI 27.25 kg/m   Body mass index is 27.25 kg/m.  Advanced Directives 07/06/2019 06/27/2019 06/06/2019 04/18/2019 02/22/2019 06/30/2018 09/22/2017  Does Patient Have a Medical Advance Directive? No No No No No No No  Would patient like information on creating a medical advance directive? - - No - Patient declined No - Patient declined No - Patient declined Yes (MAU/Ambulatory/Procedural Areas - Information given) -    Tobacco Social History   Tobacco Use  Smoking Status Never Smoker  Smokeless Tobacco Never Used     Counseling given: Not Answered   Clinical Intake:  Pre-visit preparation completed: Yes  Pain : No/denies pain Pain Score: 2  Pain Type: Chronic pain Pain Location: Back Pain Orientation: Lower, Mid Pain Descriptors / Indicators: Aching Pain Onset: More than a month ago Pain Frequency: Constant     Nutritional Status: BMI 25 -29 Overweight Nutritional Risks: None Diabetes: No  How often do you need to have someone help you when you read instructions, pamphlets, or other written materials from your doctor or pharmacy?: 1 - Never  Nutrition Risk  Assessment:  Has the patient had any N/V/D within the last 2 months?  No  Does the patient have any non-healing wounds?  No  Has the patient had any unintentional weight loss or weight gain?  No   Diabetes:  Is the patient diabetic?  Yes  If diabetic, was a CBG obtained today?  Yes  checked at home - 234 (forgot to take insulin last night) Did the patient bring in their glucometer from home?  No  How often do you monitor your CBG's? daily  Financial Strains and Diabetes Management:  Are you having any financial strains with the device, your supplies or your medication? No .  Does the patient want to be seen by Chronic Care Management for management of their diabetes?  No  Would the patient like to be referred to a Nutritionist or for Diabetic Management?  No   Diabetic Exams:  Diabetic Eye Exam: Completed 09/01/2018.   Diabetic Foot Exam: Completed 02/15/2019   Interpreter Needed?: No  Information entered by :: Tiffany Hill,LPN  Past Medical History:  Diagnosis Date  . (HFpEF) heart failure with preserved ejection fraction ([place])    a. 05/2017 Echo: EF 55-60%, Gr1 DD, mildly dil LA.  . CKD (chronic kidney disease), stage III ([place])   . Diabetes mellitus without complication ([place])   . Hyperlipidemia   . Hypertension   . Stroke [facility])    a. 08/2017.   Past Surgical History:  Procedure Laterality Date  . COLONOSCOPY  12/15/2006  . COLONOSCOPY WITH PROPOFOL N/A 06/06/2019  Procedure: COLONOSCOPY WITH PROPOFOL;  Surgeon: [name], MD;  Location: [facility] ENDOSCOPY;  Service: Gastroenterology;  Laterality: N/A;  . TUBAL LIGATION     Family History  Problem Relation Age of Onset  . Diabetes Mother   . Stroke Mother   . Hyperlipidemia Father   . Hypertension Father   . Diabetes Sister   . Diabetes Brother   . Gout Son   . Diabetes Brother   . Kidney disease Son   . Lymphoma Sister   . Heart attack Sister   . Breast cancer Neg Hx    Social History   Socioeconomic History   . Marital status: Married    Spouse name: Not on file  . Number of children: Not on file  . Years of education: Not on file  . Highest education level: GED or equivalent  Occupational History  . Occupation: retired  Scientific laboratory technician  . Financial resource strain: Not hard at all  . Food insecurity    Worry: Never true    Inability: Never true  . Transportation needs    Medical: No    Non-medical: No  Tobacco Use  . Smoking status: Never Smoker  . Smokeless tobacco: Never Used  Substance and Sexual Activity  . Alcohol use: No    Alcohol/week: 0.0 standard drinks  . Drug use: No  . Sexual activity: Yes  Lifestyle  . Physical activity    Days per week: 0 days    Minutes per session: 0 min  . Stress: Not at all  Relationships  . Social connections    Talks on phone: More than three times a week    Gets together: More than three times a week    Attends religious service: More than 4 times per year    Active member of club or organization: Yes    Attends meetings of clubs or organizations: More than 4 times per year    Relationship status: Married  Other Topics Concern  . Not on file  Social History Narrative  . Not on file    Outpatient Encounter Medications as of 07/06/2019  Medication Sig  . aspirin 81 MG tablet Take 81 mg by mouth daily.  . carvedilol (COREG) 3.125 MG tablet Take 1 tablet (3.125 mg total) by mouth 2 (two) times daily with a meal.  . Cholecalciferol (VITAMIN D-3 PO) Take 125 mcg by mouth once a week.   . clopidogrel (PLAVIX) 75 MG tablet TAKE 1 TABLET BY MOUTH  DAILY  . docusate sodium (COLACE) 100 MG capsule Take 1 capsule (100 mg total) by mouth daily. Do not take if you have loose stools  . ferrous sulfate 325 (65 FE) MG EC tablet Take 1 tablet (325 mg total) by mouth 3 (three) times daily with meals.  . furosemide (LASIX) 40 MG tablet Take 1 tablet (40 mg total) by mouth daily.  [name] HUMALOG KWIKPEN 100 UNIT/ML KiwkPen TK 12 UNITS BEFORE LUNCH AND 8 UNITS  BEFORE SUPPER.  [name] insulin detemir (LEVEMIR) 100 UNIT/ML injection Inject 12 Units into the skin every morning.   [name] NOVOFINE 32G X 6 MM MISC USE AS DIRECTED THREE TIMES DAILY WITH HUMALOG  . olmesartan (BENICAR) 40 MG tablet TAKE 1 TABLET(40 MG) BY MOUTH DAILY  . omeprazole (PRILOSEC) 20 MG capsule TAKE 1 CAPSULE(20 MG) BY MOUTH TWICE DAILY  . ONE TOUCH ULTRA TEST test strip USE TO TEST BLOOD SUGAR THREE TIMES DAILY  . pioglitazone (ACTOS) 15 MG tablet Take 1 tablet (  15 mg total) by mouth daily.  . pregabalin (LYRICA) 25 MG capsule Take 1 capsule (25 mg total) by mouth daily.  . rosuvastatin (CRESTOR) 5 MG tablet Take 1 tablet (5 mg total) by mouth daily.   No facility-administered encounter medications on file as of 07/06/2019.     Activities of Daily Living In your present state of health, do you have any difficulty performing the following activities: 07/06/2019 02/15/2019  Hearing? N N  Comment no hearing aids -  Vision? N Y  Comment eyeglasses -  Difficulty concentrating or making decisions? N Y  Walking or climbing stairs? N Y  Comment gets fatigued , no SOB -  Dressing or bathing? N N  Doing errands, shopping? N N  Preparing Food and eating ? N -  Using the Toilet? N -  In the past six months, have you accidently leaked urine? N -  Do you have problems with loss of bowel control? N -  Managing your Medications? N -  Managing your Finances? N -  Housekeeping or managing your Housekeeping? N -  Some recent data might be hidden    Patient Care Team: [name], NP as PCP - General (Nurse Practitioner) [name], MD as PCP - Cardiology (Cardiology) Solum, Betsey Holiday, MD as Physician Assistant (Endocrinology) Lavonia Dana, MD as Consulting Physician (Nephrology) [name] Situ Kathlene November, MD as Consulting Physician (Cardiology) De Hollingshead, [facility] as Pharmacist    Assessment:   This is a routine wellness examination for [place].  Exercise Activities and Dietary  recommendations Current Exercise Habits: The patient does not participate in regular exercise at present, Exercise limited by: None identified  Goals    . "I need help managing my blood sugars" (pt-stated)     Current Barriers:  [name] Knowledge Deficits related to management of diabetes and appropriate goals . T2DM, uncontrolled but improved, A1c checked today in clinic of 7.4%; managed on Levemir 12 units QAM and Novolog 8-12 units with meals (typically twice daily); pioglitazone 15 mg daily o 1+ pitting edema today, reports LEE  . Patient reports increased focus on medication adherence and dietary interventions   Pharmacist Clinical Goal(s):  [name] Over the next 30 days, patient will work with PharmD and PCP to improve monitoring of blood glucose and understanding of appropriate dietary choices  Interventions: . Congratulated patient on improved A1c. Encouraged continued medication adherence and focus on diet . Follow up scheduled with endocrinology on 06/15/2019. Will plan to call endocrinology prior to then to see if they would evaluate d/c pioglitazone at that appointment, given significant improvement in A1c and LEE . Encouraged patient to continue to evaluate carbohydrate content of meals   Patient Self Care Activities:  . Self administers medications as prescribed   Please see past updates related to this goal by clicking on the "Past Updates" button in the selected goal      . "I need to watch my blood pressure" (pt-stated)     Current Barriers:  . HTN w/ CKD and HFpEF; followed by cardiology Dr. [name] Situ . Restarted olmesartan 40 mg daily at last office visit, as patient had accidentally stopped taking it. She was also taking carvedilol once daily instead of BID . Patient confirms she started olmesartan QAM and started taking the evening dose of carvedilol . Reports BP readings 105/72, 133/81 156/88, 132/76 . Denies s/sx hypotension - dizziness, shakiness, lightheadedness  Pharmacist  Clinical Goal(s):  [name] Over the next 30 days, patient will work with  CCM team and cardiology to address needs related to optimized cardiovascular medication management  Interventions: . Comprehensive medication review performed.  . Encouraged to start using BID pill box to help organize medications . Contacted [facility] pharmacy; asked to transfer olmesartan from [facility] to their pharmacy. Will hold off on transferring Lyrica until after f/u with [name] Cannady and dose is stabilized   Patient Self Care Activities:  . Patient has not been administering all medications as prescribed, but will utilize the twice daily pill box to improve adherence . Patient will continue to check blood pressure periodically to evaluate   Please see past updates related to this goal by clicking on the "Past Updates" button in the selected goal      . DIET - INCREASE WATER INTAKE     Recommend drinking at least 6-8 glasses of water a day     . Increase water intake     Recommend drinking at least 4-5 glasses of water a day       Fall Risk: Fall Risk  07/06/2019 05/18/2019 02/15/2019 06/30/2018 06/24/2017  Falls in the past year? 0 0 1 No No  Number falls in past yr: - - 1 - -  Injury with Fall? - - 0 - -  Follow up - Falls evaluation completed - - -    FALL RISK PREVENTION PERTAINING TO THE HOME:  Any stairs in or around the home? No  If so, are there any without handrails? No   Home free of loose throw rugs in walkways, pet beds, electrical cords, etc? Yes  Adequate lighting in your home to reduce risk of falls? Yes   ASSISTIVE DEVICES UTILIZED TO PREVENT FALLS:  Life alert? No  Use of a cane, walker or w/c? No  Grab bars in the bathroom? Yes  Shower chair or bench in shower? No  Elevated toilet seat or a handicapped toilet? No   TIMED UP AND GO:  Unable to perform   Depression Screen PHQ 2/9 Scores 07/06/2019 02/15/2019 06/30/2018 06/24/2017  PHQ - 2 Score 0 0 0 0  PHQ- 9 Score - - - -      Cognitive Function     6CIT Screen 07/06/2019 06/30/2018 06/24/2017  What Year? 0 points 0 points 0 points  What month? 0 points 0 points 0 points  What time? 0 points 0 points 0 points  Count back from 20 0 points 0 points 0 points  Months in reverse 0 points 0 points 0 points  Repeat phrase 0 points 2 points 0 points  Total Score 0 2 0    Immunization History  Administered Date(s) Administered  . Influenza, High Dose Seasonal PF 09/10/2016, 11/16/2018  . Influenza,inj,Quad PF,6+ Mos 09/18/2015  . Influenza-Unspecified 09/18/2015, 09/08/2017  . PPD Test 07/15/2016, 07/29/2016  . Pneumococcal Conjugate-13 06/07/2014  . Pneumococcal Polysaccharide-23 09/07/2013  . Td 06/12/2005  . Tdap 10/10/2016  . Zoster 09/13/2013    Qualifies for Shingles Vaccine? Yes  Zostavax completed 09/13/2013. Due for Shingrix. Education has been provided regarding the importance of this vaccine. Pt has been advised to call insurance company to determine out of pocket expense. Advised may also receive vaccine at local pharmacy or Health Dept. Verbalized acceptance and understanding.  Tdap: up to date   Flu Vaccine: up to date   Pneumococcal Vaccine: up to date   Screening Tests Health Maintenance  Topic Date Due  . Fecal DNA (Cologuard)  02/09/1998  . MAMMOGRAM  03/12/2019  . INFLUENZA VACCINE  07/16/2019  . OPHTHALMOLOGY EXAM  09/02/2019  . HEMOGLOBIN A1C  11/17/2019  . FOOT EXAM  02/15/2020  . TETANUS/TDAP  10/10/2026  . DEXA SCAN  Completed  . Hepatitis C Screening  Completed  . PNA vac Low Risk Adult  Completed    Cancer Screenings:  Colorectal Screening: has to be rescheduled - waiting on insurance   Mammogram: ordered   Bone Density: 03/11/2017  Lung Cancer Screening: (Low Dose CT Chest recommended if Age 20-80 years, 30 pack-year currently smoking OR have quit w/in 15years.) does not qualify.   Additional Screening:  Hepatitis C Screening: does qualify; Completed 09/10/2016   Dental Screening: Recommended annual dental exams for proper oral hygiene   Community Resource Referral:  CRR required this visit?  No       Plan:  I have personally reviewed and addressed the Medicare Annual Wellness questionnaire and have noted the following in the patient's chart:  A. Medical and social history B. Use of alcohol, tobacco or illicit drugs  C. Current medications and supplements D. Functional ability and status E.  Nutritional status F.  Physical activity G. Advance directives H. List of other physicians I.  Hospitalizations, surgeries, and ER visits in previous 12 months J.  [place] such as hearing and vision if needed, cognitive and depression L. Referrals and appointments   In addition, I have reviewed and discussed with patient certain preventive protocols, quality metrics, and best practice recommendations. A written personalized care plan for preventive services as well as general preventive health recommendations were provided to patient.   Signed,    Bevelyn Ngo, LPN  [id number] Nurse Health Advisor   Nurse Notes: none

## 2019-07-06 NOTE — Chronic Care Management (AMB) (Signed)
  Chronic Care Management   Note  07/06/2019 Name: Tonya [name] MRN: [id number] DOB: [birth year]  Tonya [name] is a 73 y.o. year old female who is a primary care patient of Cannady, [name], NP. The CCM team was consulted for assistance with chronic disease management and care coordination needs.    Attempted to contact patient to discuss medication management, including blood sugars, blood pressures, and statin adherence. Unable to leave a message on either listed number.   Follow up plan: - Will outreach again in 3-5 weeks for continued medication management support  [name], PharmD Clinical Pharmacist [place] [phone]

## 2019-07-06 NOTE — Patient Instructions (Signed)
Tonya [name] , Thank you for taking time to come for your Medicare Wellness Visit. I appreciate your ongoing commitment to your health goals. Please review the following plan we discussed and let me know if I can assist you in the future.   Screening recommendations/referrals: Colonoscopy: waiting in insurance for rescreening  Mammogram: Please call [phone] to schedule your mammogram.  Bone Density: completed 03/11/2017 Recommended yearly ophthalmology/optometry visit for glaucoma screening and checkup Recommended yearly dental visit for hygiene and checkup  Vaccinations: Influenza vaccine: up to date Pneumococcal vaccine: up to date Tdap vaccine: up to date Shingles vaccine: shingrix eligible, check with your insurance company for coverage     Advanced directives: please let us know if you have any questions or concerns regarding this paperwork.   Conditions/risks identified: informed of chronic care management team. Please let us know if you are interested in speaking with one of our team members regarding your diabetes   Next appointment: Follow up in one year for your annual wellness visit.    Preventive Care 17 Years and Older, Female Preventive care refers to lifestyle choices and visits with your health care provider that can promote health and wellness. What does preventive care include?  A yearly physical exam. This is also called an annual well check.  Dental exams once or twice a year.  Routine eye exams. Ask your health care provider how often you should have your eyes checked.  Personal lifestyle choices, including:  Daily care of your teeth and gums.  Regular physical activity.  Eating a healthy diet.  Avoiding tobacco and drug use.  Limiting alcohol use.  Practicing safe sex.  Taking low-dose aspirin every day.  Taking vitamin and mineral supplements as recommended by your health care provider. What happens during an annual well check? The services  and screenings done by your health care provider during your annual well check will depend on your age, overall health, lifestyle risk factors, and family history of disease. Counseling  Your health care provider may ask you questions about your:  Alcohol use.  Tobacco use.  Drug use.  Emotional well-being.  Home and relationship well-being.  Sexual activity.  Eating habits.  History of falls.  Memory and ability to understand (cognition).  Work and work Statistician.  Reproductive health. Screening  You may have the following tests or measurements:  Height, weight, and BMI.  Blood pressure.  Lipid and cholesterol levels. These may be checked every 5 years, or more frequently if you are over 9 years old.  Skin check.  Lung cancer screening. You may have this screening every year starting at age 56 if you have a 30-pack-year history of smoking and currently smoke or have quit within the past 15 years.  Fecal occult blood test (FOBT) of the stool. You may have this test every year starting at age 9.  Flexible sigmoidoscopy or colonoscopy. You may have a sigmoidoscopy every 5 years or a colonoscopy every 10 years starting at age 53.  Hepatitis C blood test.  Hepatitis B blood test.  Sexually transmitted disease (STD) testing.  Diabetes screening. This is done by checking your blood sugar (glucose) after you have not eaten for a while (fasting). You may have this done every 1-3 years.  Bone density scan. This is done to screen for osteoporosis. You may have this done starting at age 3.  Mammogram. This may be done every 1-2 years. Talk to your health care provider about how often you should have  regular mammograms. Talk with your health care provider about your test results, treatment options, and if necessary, the need for more tests. Vaccines  Your health care provider may recommend certain vaccines, such as:  Influenza vaccine. This is recommended every year.   Tetanus, diphtheria, and acellular pertussis (Tdap, Td) vaccine. You may need a Td booster every 10 years.  Zoster vaccine. You may need this after age 30.  Pneumococcal 13-valent conjugate (PCV13) vaccine. One dose is recommended after age 45.  Pneumococcal polysaccharide (PPSV23) vaccine. One dose is recommended after age 80. Talk to your health care provider about which screenings and vaccines you need and how often you need them. This information is not intended to replace advice given to you by your health care provider. Make sure you discuss any questions you have with your health care provider. Document Released: 12/28/2015 Document Revised: 08/20/2016 Document Reviewed: 10/02/2015 Elsevier Interactive Patient Education  2017 [employer] Prevention in the Home Falls can cause injuries. They can happen to people of all ages. There are many things you can do to make your home safe and to help prevent falls. What can I do on the outside of my home?  Regularly fix the edges of walkways and driveways and fix any cracks.  Remove anything that might make you trip as you walk through a door, such as a raised step or threshold.  Trim any bushes or trees on the path to your home.  Use bright outdoor lighting.  Clear any walking paths of anything that might make someone trip, such as rocks or tools.  Regularly check to see if handrails are loose or broken. Make sure that both sides of any steps have handrails.  Any raised decks and porches should have guardrails on the edges.  Have any leaves, snow, or ice cleared regularly.  Use sand or salt on walking paths during winter.  Clean up any spills in your garage right away. This includes oil or grease spills. What can I do in the bathroom?  Use night lights.  Install grab bars by the toilet and in the tub and shower. Do not use towel bars as grab bars.  Use non-skid mats or decals in the tub or shower.  If you need to  sit down in the shower, use a plastic, non-slip stool.  Keep the floor dry. Clean up any water that spills on the floor as soon as it happens.  Remove soap buildup in the tub or shower regularly.  Attach bath mats securely with double-sided non-slip rug tape.  Do not have throw rugs and other things on the floor that can make you trip. What can I do in the bedroom?  Use night lights.  Make sure that you have a light by your bed that is easy to reach.  Do not use any sheets or blankets that are too big for your bed. They should not hang down onto the floor.  Have a firm chair that has side arms. You can use this for support while you get dressed.  Do not have throw rugs and other things on the floor that can make you trip. What can I do in the kitchen?  Clean up any spills right away.  Avoid walking on wet floors.  Keep items that you use a lot in easy-to-reach places.  If you need to reach something above you, use a strong step stool that has a grab bar.  Keep electrical cords out of the  way.  Do not use floor polish or wax that makes floors slippery. If you must use wax, use non-skid floor wax.  Do not have throw rugs and other things on the floor that can make you trip. What can I do with my stairs?  Do not leave any items on the stairs.  Make sure that there are handrails on both sides of the stairs and use them. Fix handrails that are broken or loose. Make sure that handrails are as long as the stairways.  Check any carpeting to make sure that it is firmly attached to the stairs. Fix any carpet that is loose or worn.  Avoid having throw rugs at the top or bottom of the stairs. If you do have throw rugs, attach them to the floor with carpet tape.  Make sure that you have a light switch at the top of the stairs and the bottom of the stairs. If you do not have them, ask someone to add them for you. What else can I do to help prevent falls?  Wear shoes that:  Do not  have high heels.  Have rubber bottoms.  Are comfortable and fit you well.  Are closed at the toe. Do not wear sandals.  If you use a stepladder:  Make sure that it is fully opened. Do not climb a closed stepladder.  Make sure that both sides of the stepladder are locked into place.  Ask someone to hold it for you, if possible.  Clearly mark and make sure that you can see:  Any grab bars or handrails.  First and last steps.  Where the edge of each step is.  Use tools that help you move around (mobility aids) if they are needed. These include:  Canes.  Walkers.  Scooters.  Crutches.  Turn on the lights when you go into a dark area. Replace any light bulbs as soon as they burn out.  Set up your furniture so you have a clear path. Avoid moving your furniture around.  If any of your floors are uneven, fix them.  If there are any pets around you, be aware of where they are.  Review your medicines with your doctor. Some medicines can make you feel dizzy. This can increase your chance of falling. Ask your doctor what other things that you can do to help prevent falls. This information is not intended to replace advice given to you by your health care provider. Make sure you discuss any questions you have with your health care provider. Document Released: 09/27/2009 Document Revised: 05/08/2016 Document Reviewed: 01/05/2015 Elsevier Interactive Patient Education  2017 [employer].

## 2019-07-15 ENCOUNTER — Telehealth: Payer: Self-pay

## 2019-07-18 ENCOUNTER — Telehealth: Payer: Self-pay | Admitting: Nurse Practitioner

## 2019-07-18 NOTE — Telephone Encounter (Signed)
Spoke with pt and let her know that it is being placed in the mail.

## 2019-07-18 NOTE — Telephone Encounter (Signed)
Pt want to know the status of her handicapp form she brought in,

## 2019-07-19 ENCOUNTER — Other Ambulatory Visit: Payer: Self-pay

## 2019-07-19 NOTE — Patient Outreach (Signed)
[facility]) Care Management  07/19/2019  Tonya [name] Feb 13, 1946 [id number]  Medication Adherence call to Tonya [name] [facility] Identifiers Verify spoke with patient she is past due on Rosuvastatin 5 mg and Pioglitazone 15 mg patient explain she is still taking both medication and has a pill box fill up for 7 more days her daughter will place and order thru [facility] in a couple of days. Tonya [name] is showing past due under Tonya [name].   [facility] Management Direct Dial [phone]  Fax [phone] [name]

## 2019-07-25 ENCOUNTER — Other Ambulatory Visit: Payer: Self-pay | Admitting: Oncology

## 2019-07-29 ENCOUNTER — Telehealth: Payer: Self-pay

## 2019-08-02 ENCOUNTER — Ambulatory Visit (INDEPENDENT_AMBULATORY_CARE_PROVIDER_SITE_OTHER): Payer: Medicare Other | Admitting: Pharmacist

## 2019-08-02 DIAGNOSIS — E1165 Type 2 diabetes mellitus with hyperglycemia: Secondary | ICD-10-CM

## 2019-08-02 DIAGNOSIS — B0229 Other postherpetic nervous system involvement: Secondary | ICD-10-CM

## 2019-08-02 DIAGNOSIS — E1140 Type 2 diabetes mellitus with diabetic neuropathy, unspecified: Secondary | ICD-10-CM | POA: Diagnosis not present

## 2019-08-02 NOTE — Patient Instructions (Signed)
Visit Information  Goals Addressed            This Visit's Progress     Patient Stated   . "I need help managing my blood sugars" (pt-stated)       Current Barriers:  . Diabetes: uncontrolled but improved, most recent A1c 7.4%  o Follows with Dr. [name] at [facility], last visit 06/15/2019, A1c checked on 06/06/2019 was 6.8% on their lab o Refill history for multiple medications reflects non-adherence; review pill bottles with patient today.  . Current antihyperglycemic regimen: pioglitazone 15 mg daily, Levemir 12 units daily, Humalog 12 units with lunch, 8 units with supper  . Denies episodes of hypoglycemia; notes one day with BG 91 that she felt "funny". . Current blood glucose readings: Fasting generally in 130s . Cardiovascular risk reduction: o Current hypertensive regimen: olmesartan 40 mg daily, carvedilol 3.125 mg BID;  o Current hyperlipidemia regimen: rosuvastatin 5 mg daily; last LDL 73   Pharmacist Clinical Goal(s):  [name] Over the next 90 days, patient with work with PharmD and primary care provider to address optimized medication management  Interventions: . Comprehensive medication review performed, medication list updated in electronic medical record . Refill history for multiple medications suggests nonadherence. Reviewed medications; patient has all of her medications and notes that her daughter fills a pill box weekly. Will continue to review with her.   Patient Self Care Activities:  . Patient will check blood glucose daily, document, and provide at future appointments . Patient will focus on medication adherence by using her pill box . Patient will contact provider with any episodes of hypoglycemia . Patient will report any questions or concerns to provider   Please see past updates related to this goal by clicking on the "Past Updates" button in the selected goal         The patient verbalized understanding of instructions provided today and declined a  print copy of patient instruction materials.   Plan:  - Will alert [name] Cannady to patient's medication concerns above - Will outreach patient in 4-5 weeks for continued medication management  [name], PharmD Clinical Pharmacist [place] [phone]

## 2019-08-02 NOTE — Chronic Care Management (AMB) (Signed)
Chronic Care Management   Follow Up Note   08/02/2019 Name: Tonya [name] MRN: [id number] DOB: November 07, 1946  Referred by: Tonya Lick, NP Reason for referral : Chronic Care Management (Medication Management)   Tonya [name] is a 73 y.o. year old female who is a primary care patient of [name], Tonya Faster, NP. The CCM team was consulted for assistance with chronic disease management and care coordination needs.    Contacted patient for medication management today. 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 She wonders if the pregabalin dose can be increased. She has follow up scheduled with Tonya [name] on 09/05/2019.   Review of patient status, including review of consultants reports, relevant laboratory and other test results, and collaboration with appropriate care team members and the patient's provider was performed as part of comprehensive patient evaluation and provision of chronic care management services.    Outpatient Encounter Medications as of 08/02/2019  Medication Sig Note  . aspirin 81 MG tablet Take 81 mg by mouth daily.   . carvedilol (COREG) 3.125 MG tablet Take 1 tablet (3.125 mg total) by mouth 2 (two) times daily with a meal.   . clopidogrel (PLAVIX) 75 MG tablet TAKE 1 TABLET BY MOUTH  DAILY   . DOK 100 MG capsule TAKE ONE CAPSULE BY MOUTH EVERY DAY. DO NOT TAKE IF YOU HAVE LOOSE STOOLS   . ferrous sulfate 325 (65 FE) MG EC tablet Take 1 tablet (325 mg total) by mouth 3 (three) times daily with meals. 08/02/2019: Taking BID  . furosemide (LASIX) 40 MG tablet Take 1 tablet (40 mg total) by mouth daily.   [name] HUMALOG KWIKPEN 100 UNIT/ML KiwkPen TK 12 UNITS BEFORE LUNCH AND 8 UNITS BEFORE SUPPER.   [name] insulin detemir (LEVEMIR) 100 UNIT/ML injection Inject 12 Units into the skin every morning.    [name] NOVOFINE 32G X 6 MM MISC USE AS DIRECTED THREE TIMES DAILY WITH HUMALOG   . olmesartan (BENICAR) 40 MG tablet TAKE 1 TABLET(40 MG) BY  MOUTH DAILY   . omeprazole (PRILOSEC) 20 MG capsule TAKE 1 CAPSULE(20 MG) BY MOUTH TWICE DAILY   . ONE TOUCH ULTRA TEST test strip USE TO TEST BLOOD SUGAR THREE TIMES DAILY   . pioglitazone (ACTOS) 15 MG tablet Take 1 tablet (15 mg total) by mouth daily.   . pregabalin (LYRICA) 25 MG capsule Take 1 capsule (25 mg total) by mouth daily.   . rosuvastatin (CRESTOR) 5 MG tablet Take 1 tablet (5 mg total) by mouth daily.   . [DISCONTINUED] Cholecalciferol (VITAMIN D-3 PO) Take 125 mcg by mouth once a week.     No facility-administered encounter medications on file as of 08/02/2019.      Goals Addressed            This Visit's Progress     Patient Stated   . "I need help managing my blood sugars" (pt-stated)       Current Barriers:  . Diabetes: uncontrolled but improved, most recent A1c 7.4%  o Follows with Dr. Gabriel [name] at [facility], last visit 06/15/2019, A1c checked on 06/06/2019 was 6.8% on their lab o Refill history for multiple medications reflects non-adherence; review pill bottles with patient today.  . Current antihyperglycemic regimen: pioglitazone 15 mg daily, Levemir 12 units daily, Humalog 12 units with lunch, 8 units with supper  . Denies episodes of hypoglycemia; notes one day with BG 91 that she felt "funny". . Current blood glucose readings:  Fasting generally in 130s . Cardiovascular risk reduction: o Current hypertensive regimen: olmesartan 40 mg daily, carvedilol 3.125 mg BID;  o Current hyperlipidemia regimen: rosuvastatin 5 mg daily; last LDL 73   Pharmacist Clinical Goal(s):  [name] Over the next 90 days, patient with work with PharmD and primary care provider to address optimized medication management  Interventions: . Comprehensive medication review performed, medication list updated in electronic medical record . Refill history for multiple medications suggests nonadherence. Reviewed medications; patient has all of her medications and notes that her daughter fills a  pill box weekly. Will continue to review with her.   Patient Self Care Activities:  . Patient will check blood glucose daily, document, and provide at future appointments . Patient will focus on medication adherence by using her pill box . Patient will contact provider with any episodes of hypoglycemia . Patient will report any questions or concerns to provider   Please see past updates related to this goal by clicking on the "Past Updates" button in the selected goal         Plan:  - Will alert [name] [name] to patient's medication concerns above - Will outreach patient in 4-5 weeks for continued medication management  [name], PharmD Clinical Pharmacist [place] [phone]

## 2019-08-09 ENCOUNTER — Other Ambulatory Visit: Payer: Self-pay | Admitting: Nurse Practitioner

## 2019-08-09 NOTE — Telephone Encounter (Signed)
Requested medication (s) are due for refill today: yes  Requested medication (s) are on the active medication list: yes  Last refill:  03/29/2019  Future visit scheduled: yes  Notes to clinic: Requesting 1 year supply   Requested Prescriptions  Pending Prescriptions Disp Refills   omeprazole (PRILOSEC) 20 MG capsule [Pharmacy Med Name: OMEPRAZOLE  20MG   CAP] 180 capsule 3    Sig: TAKE 1 CAPSULE BY MOUTH  TWICE DAILY     Gastroenterology: Proton Pump Inhibitors Passed - 08/09/2019 12:54 PM      Passed - Valid encounter within last 12 months    Recent Outpatient Visits          2 months ago Post herpetic neuralgia   [place], [name] T, NP   2 months ago Poorly controlled type 2 diabetes mellitus with neuropathy ([place])   [place], [name] T, NP   5 months ago Poorly controlled type 2 diabetes mellitus with neuropathy ([place])   [place] Cannady, [name], NP   8 months ago Flu vaccine need   [employer], Henrine Screws T, NP   1 year ago Postural dizziness with presyncope   [facility] Kathrine Haddock, NP      Future Appointments            In 3 weeks Cannady, [name], NP [employer]            clopidogrel (PLAVIX) 75 MG tablet [Pharmacy Med Name: CLOPIDOGREL  75MG   TAB] 90 tablet 3    Sig: TAKE 1 TABLET BY MOUTH  DAILY     Hematology: Antiplatelets - clopidogrel Failed - 08/09/2019 12:54 PM      Failed - Evaluate AST, ALT within 2 months of therapy initiation.      Failed - HCT in normal range and within 180 days    HCT  Date Value Ref Range Status  06/24/2019 31.3 (L) 36.0 - 46.0 % Final   Hematocrit  Date Value Ref Range Status  02/15/2019 27.8 (L) 34.0 - 46.6 % Final         Failed - HGB in normal range and within 180 days    Hemoglobin  Date Value Ref Range Status  06/24/2019 9.7 (L) 12.0 - 15.0 g/dL Final  02/15/2019 8.9 (L) 11.1 - 15.9 g/dL Final         Passed - ALT in normal range and within 360 days    ALT  Date Value Ref Range Status  06/24/2019 12 0 - 44 U/L Final         Passed - AST in normal range and within 360 days    AST  Date Value Ref Range Status  06/24/2019 16 15 - 41 U/L Final         Passed - PLT in normal range and within 180 days    Platelets  Date Value Ref Range Status  06/24/2019 275 150 - 400 K/uL Final  02/15/2019 294 150 - 450 x10E3/uL Final         Passed - Valid encounter within last 6 months    Recent Outpatient Visits          2 months ago Post herpetic neuralgia   [place], [place] T, NP   2 months ago Poorly controlled type 2 diabetes mellitus with neuropathy ([place])   [place], [name] T, NP   5 months ago Poorly controlled type  2 diabetes mellitus with neuropathy ([place])   [place] Cannady, [name], NP   8 months ago Flu vaccine need   [place], [place] T, NP   1 year ago Postural dizziness with presyncope   [facility] Kathrine Haddock, NP      Future Appointments            In 3 weeks Cannady, [name], NP [employer]

## 2019-08-18 ENCOUNTER — Ambulatory Visit: Payer: Medicare Other | Admitting: Nurse Practitioner

## 2019-08-31 DIAGNOSIS — D631 Anemia in chronic kidney disease: Secondary | ICD-10-CM | POA: Diagnosis not present

## 2019-08-31 DIAGNOSIS — I129 Hypertensive chronic kidney disease with stage 1 through stage 4 chronic kidney disease, or unspecified chronic kidney disease: Secondary | ICD-10-CM | POA: Diagnosis not present

## 2019-08-31 DIAGNOSIS — N2581 Secondary hyperparathyroidism of renal origin: Secondary | ICD-10-CM | POA: Diagnosis not present

## 2019-08-31 DIAGNOSIS — N189 Chronic kidney disease, unspecified: Secondary | ICD-10-CM | POA: Insufficient documentation

## 2019-08-31 DIAGNOSIS — N184 Chronic kidney disease, stage 4 (severe): Secondary | ICD-10-CM | POA: Diagnosis not present

## 2019-08-31 DIAGNOSIS — E1129 Type 2 diabetes mellitus with other diabetic kidney complication: Secondary | ICD-10-CM | POA: Diagnosis not present

## 2019-09-05 ENCOUNTER — Ambulatory Visit: Payer: Medicare Other | Admitting: Nurse Practitioner

## 2019-09-06 ENCOUNTER — Ambulatory Visit (INDEPENDENT_AMBULATORY_CARE_PROVIDER_SITE_OTHER): Payer: Medicare Other | Admitting: Pharmacist

## 2019-09-06 DIAGNOSIS — E1165 Type 2 diabetes mellitus with hyperglycemia: Secondary | ICD-10-CM | POA: Diagnosis not present

## 2019-09-06 DIAGNOSIS — E1140 Type 2 diabetes mellitus with diabetic neuropathy, unspecified: Secondary | ICD-10-CM | POA: Diagnosis not present

## 2019-09-06 NOTE — Chronic Care Management (AMB) (Signed)
Chronic Care Management   Follow Up Note   09/06/2019 Name: Tonya [name] MRN: [id number] DOB: [birth year]  Referred by: [name], NP Reason for referral : Chronic Care Management (Medication Management)   Tonya [name] is a 73 y.o. year old female who is a primary care patient of Cannady, [name], NP. The CCM team was consulted for assistance with chronic disease management and care coordination needs.    Contacted patient telephonically for medication management review.  Review of patient status, including review of consultants reports, relevant laboratory and other test results, and collaboration with appropriate care team members and the patient's provider was performed as part of comprehensive patient evaluation and provision of chronic care management services.    SDOH (Social Determinants of Health) screening performed today: None. See Care Plan for related entries.   Advanced Directives Status: N See Care Plan and Vynca application for related entries.  Outpatient Encounter Medications as of 09/06/2019  Medication Sig Note  . aspirin 81 MG tablet Take 81 mg by mouth daily.   . carvedilol (COREG) 3.125 MG tablet Take 1 tablet (3.125 mg total) by mouth 2 (two) times daily with a meal.   . clopidogrel (PLAVIX) 75 MG tablet TAKE 1 TABLET BY MOUTH  DAILY   . DOK 100 MG capsule TAKE ONE CAPSULE BY MOUTH EVERY DAY. DO NOT TAKE IF YOU HAVE LOOSE STOOLS   . ferrous sulfate 325 (65 FE) MG EC tablet Take 1 tablet (325 mg total) by mouth 3 (three) times daily with meals. 08/02/2019: Taking BID  . furosemide (LASIX) 40 MG tablet Take 1 tablet (40 mg total) by mouth daily.   [name] HUMALOG KWIKPEN 100 UNIT/ML KiwkPen TK 12 UNITS BEFORE LUNCH AND 8 UNITS BEFORE SUPPER.   [name] insulin detemir (LEVEMIR) 100 UNIT/ML injection Inject 12 Units into the skin every morning.    [name] NOVOFINE 32G X 6 MM MISC USE AS DIRECTED THREE TIMES DAILY WITH HUMALOG   . olmesartan (BENICAR) 40 MG tablet TAKE 1  TABLET(40 MG) BY MOUTH DAILY   . omeprazole (PRILOSEC) 20 MG capsule TAKE 1 CAPSULE BY MOUTH  TWICE DAILY   . ONE TOUCH ULTRA TEST test strip USE TO TEST BLOOD SUGAR THREE TIMES DAILY   . pioglitazone (ACTOS) 15 MG tablet Take 1 tablet (15 mg total) by mouth daily.   . pregabalin (LYRICA) 25 MG capsule Take 1 capsule (25 mg total) by mouth daily.   . rosuvastatin (CRESTOR) 5 MG tablet Take 1 tablet (5 mg total) by mouth daily.    No facility-administered encounter medications on file as of 09/06/2019.      Goals Addressed            This Visit's Progress     Patient Stated   . "I need help managing my blood sugars" (pt-stated)       Current Barriers:  . Diabetes: uncontrolled but improved, most recent A1c 7.4%  o Follows with Dr. [name] at [facility] o Patient missed appointment yesterday with [name] . Current antihyperglycemic regimen: pioglitazone 15 mg daily, Levemir 12 units daily, Humalog 12 units with lunch, 8 units with supper  . Notes her meter was broken for a little while, but actually just required a new battery which her daughter replaced yesterday. Daughter fills pillbox for he rmother.  . Current blood glucose readings: Unsure since her meter was broken yesterday . Cardiovascular risk reduction: o Current hypertensive regimen: olmesartan 40 mg daily, carvedilol 3.125  mg BID;  o Current hyperlipidemia regimen: rosuvastatin 5 mg daily; last LDL 73   Pharmacist Clinical Goal(s):  [name] Over the next 90 days, patient with work with PharmD and primary care provider to address optimized medication management  Interventions: . Comprehensive medication review performed, medication list updated in electronic medical record . Assisted patient in rescheduling appointment with primary care provider later in October . Reviewed that patient has labwork/appointment upcoming with Dr. [name]. Encouraged to continue to check BG regularly and bring meter to the appointment .  Will mail patient an updated list of medications and appropriate administration times; requested that she let her daughter see to ensure appropriate pill box fills.   Patient Self Care Activities:  . Patient will check blood glucose daily, document, and provide at future appointments . Patient will focus on medication adherence by using her pill box . Patient will contact provider with any episodes of hypoglycemia . Patient will report any questions or concerns to provider   Please see past updates related to this goal by clicking on the "Past Updates" button in the selected goal      . "I need to watch my blood pressure" (pt-stated)       Current Barriers:  . HTN w/ CKD and HFpEF; followed by cardiology Dr. [name] Situ . Restarted olmesartan 40 mg daily at last office visit, as patient had accidentally stopped taking it. She was also taking carvedilol once daily instead of BID . Patient confirms she started olmesartan QAM and started taking the evening dose of carvedilol . Reports BP readings: yesterday 154/80 w/ [facility]; 130-140s/70-80s . Denies s/sx hypotension - dizziness, shakiness, lightheadedness  Pharmacist Clinical Goal(s):  [name] Over the next 30 days, patient will work with CCM team and cardiology to address needs related to optimized cardiovascular medication management  Interventions: . Comprehensive medication review performed.  . Encouraged to start using BID pill box to help organize medications . Contacted [facility] pharmacy; asked to transfer olmesartan from [facility] to their pharmacy. Will hold off on transferring Lyrica until after f/u with [name] Cannady and dose is stabilized   Patient Self Care Activities:  . Patient has not been administering all medications as prescribed, but will utilize the twice daily pill box to improve adherence . Patient will continue to check blood pressure periodically to evaluate   Please see past updates related to this goal by clicking on the  "Past Updates" button in the selected goal         Plan:  - Will mail updated medication list to patient - Will outreach patient in the next 6-8 weeks for continued medication management support  [name], PharmD Clinical Pharmacist [place] [phone]

## 2019-09-06 NOTE — Patient Instructions (Signed)
Visit Information  Goals Addressed            This Visit's Progress     Patient Stated   . "I need help managing my blood sugars" (pt-stated)       Current Barriers:  . Diabetes: uncontrolled but improved, most recent A1c 7.4%  o Follows with Dr. [name] at [facility] o Patient missed appointment yesterday with [name] . Current antihyperglycemic regimen: pioglitazone 15 mg daily, Levemir 12 units daily, Humalog 12 units with lunch, 8 units with supper  . Notes her meter was broken for a little while, but actually just required a new battery which her daughter replaced yesterday. Daughter fills pillbox for he rmother.  . Current blood glucose readings: Unsure since her meter was broken yesterday . Cardiovascular risk reduction: o Current hypertensive regimen: olmesartan 40 mg daily, carvedilol 3.125 mg BID;  o Current hyperlipidemia regimen: rosuvastatin 5 mg daily; last LDL 73   Pharmacist Clinical Goal(s):  [name] Over the next 90 days, patient with work with PharmD and primary care provider to address optimized medication management  Interventions: . Comprehensive medication review performed, medication list updated in electronic medical record . Assisted patient in rescheduling appointment with primary care provider later in October . Reviewed that patient has labwork/appointment upcoming with Dr. [name]. Encouraged to continue to check BG regularly and bring meter to the appointment . Will mail patient an updated list of medications and appropriate administration times; requested that she let her daughter see to ensure appropriate pill box fills.   Patient Self Care Activities:  . Patient will check blood glucose daily, document, and provide at future appointments . Patient will focus on medication adherence by using her pill box . Patient will contact provider with any episodes of hypoglycemia . Patient will report any questions or concerns to provider   Please see past  updates related to this goal by clicking on the "Past Updates" button in the selected goal      . "I need to watch my blood pressure" (pt-stated)       Current Barriers:  . HTN w/ CKD and HFpEF; followed by cardiology Dr. [name] Situ . Restarted olmesartan 40 mg daily at last office visit, as patient had accidentally stopped taking it. She was also taking carvedilol once daily instead of BID . Patient confirms she started olmesartan QAM and started taking the evening dose of carvedilol . Reports BP readings: yesterday 154/80 w/ [facility]; 130-140s/70-80s . Denies s/sx hypotension - dizziness, shakiness, lightheadedness  Pharmacist Clinical Goal(s):  [name] Over the next 30 days, patient will work with CCM team and cardiology to address needs related to optimized cardiovascular medication management  Interventions: . Comprehensive medication review performed.  . Encouraged to start using BID pill box to help organize medications . Contacted [facility] pharmacy; asked to transfer [facility] from [facility] to [facility] pharmacy. Will hold off on transferring Lyrica until after f/u with [name] Cannady and dose is stabilized   Patient Self Care Activities:  . Patient has not been administering all medications as prescribed, but will utilize the twice daily pill box to improve adherence . Patient will continue to check blood pressure periodically to evaluate   Please see past updates related to this goal by clicking on the "Past Updates" button in the selected goal         The patient verbalized understanding of instructions provided today and declined a print copy of patient instruction materials.   Plan:  - Will mail  updated medication list to patient - Will outreach patient in the next 6-8 weeks for continued medication management support  [name], PharmD Clinical Pharmacist [place] [phone]

## 2019-09-13 LAB — FECAL OCCULT BLOOD, IMMUNOCHEMICAL: IFOBT: NEGATIVE

## 2019-09-14 DIAGNOSIS — Z794 Long term (current) use of insulin: Secondary | ICD-10-CM | POA: Diagnosis not present

## 2019-09-14 DIAGNOSIS — E113393 Type 2 diabetes mellitus with moderate nonproliferative diabetic retinopathy without macular edema, bilateral: Secondary | ICD-10-CM | POA: Diagnosis not present

## 2019-09-21 DIAGNOSIS — Z794 Long term (current) use of insulin: Secondary | ICD-10-CM | POA: Diagnosis not present

## 2019-09-21 DIAGNOSIS — E1165 Type 2 diabetes mellitus with hyperglycemia: Secondary | ICD-10-CM | POA: Diagnosis not present

## 2019-09-21 DIAGNOSIS — E113393 Type 2 diabetes mellitus with moderate nonproliferative diabetic retinopathy without macular edema, bilateral: Secondary | ICD-10-CM | POA: Diagnosis not present

## 2019-09-21 DIAGNOSIS — E1121 Type 2 diabetes mellitus with diabetic nephropathy: Secondary | ICD-10-CM | POA: Diagnosis not present

## 2019-09-26 ENCOUNTER — Encounter: Payer: Self-pay | Admitting: Oncology

## 2019-09-26 ENCOUNTER — Other Ambulatory Visit: Payer: Self-pay

## 2019-09-26 NOTE — Progress Notes (Signed)
Patient prescreened for appointment. No concerns voiced.  

## 2019-09-27 ENCOUNTER — Inpatient Hospital Stay: Payer: Medicare Other | Attending: Oncology

## 2019-09-27 ENCOUNTER — Inpatient Hospital Stay: Payer: Medicare Other

## 2019-09-27 ENCOUNTER — Inpatient Hospital Stay (HOSPITAL_BASED_OUTPATIENT_CLINIC_OR_DEPARTMENT_OTHER): Payer: Medicare Other | Admitting: Oncology

## 2019-09-27 ENCOUNTER — Other Ambulatory Visit: Payer: Self-pay

## 2019-09-27 VITALS — BP 147/85 | HR 92 | Temp 96.8°F | Resp 18 | Wt 173.6 lb

## 2019-09-27 DIAGNOSIS — N184 Chronic kidney disease, stage 4 (severe): Secondary | ICD-10-CM | POA: Diagnosis not present

## 2019-09-27 DIAGNOSIS — D500 Iron deficiency anemia secondary to blood loss (chronic): Secondary | ICD-10-CM | POA: Diagnosis not present

## 2019-09-27 DIAGNOSIS — D631 Anemia in chronic kidney disease: Secondary | ICD-10-CM | POA: Diagnosis not present

## 2019-09-27 DIAGNOSIS — D509 Iron deficiency anemia, unspecified: Secondary | ICD-10-CM | POA: Insufficient documentation

## 2019-09-27 LAB — RETIC PANEL
Immature Retic Fract: 12.8 % (ref 2.3–15.9)
RBC.: 3.86 MIL/uL — ABNORMAL LOW (ref 3.87–5.11)
Retic Count, Absolute: 68.3 10*3/uL (ref 19.0–186.0)
Retic Ct Pct: 1.8 % (ref 0.4–3.1)
Reticulocyte Hemoglobin: 31.6 pg (ref >27.9)

## 2019-09-27 LAB — CBC
HCT: 33.7 % — ABNORMAL LOW (ref 36.0–46.0)
Hemoglobin: 10.4 g/dL — ABNORMAL LOW (ref 12.0–15.0)
MCH: 26.9 pg (ref 26.0–34.0)
MCHC: 30.9 g/dL (ref 30.0–36.0)
MCV: 87.3 fL (ref 80.0–100.0)
Platelets: 246 10*3/uL (ref 150–400)
RBC: 3.86 MIL/uL — ABNORMAL LOW (ref 3.87–5.11)
RDW: 14 % (ref 11.5–15.5)
WBC: 6.4 10*3/uL (ref 4.0–10.5)
nRBC: 0 % (ref 0.0–0.2)

## 2019-09-27 LAB — IRON AND TIBC
Iron: 62 ug/dL (ref 28–170)
Saturation Ratios: 20 % (ref 10.4–31.8)
TIBC: 305 ug/dL (ref 250–450)
UIBC: 243 ug/dL

## 2019-09-27 LAB — FERRITIN: Ferritin: 96 ng/mL (ref 11–307)

## 2019-09-27 MED ORDER — FERROUS SULFATE 325 (65 FE) MG PO TBEC: 325.0000 mg | DELAYED_RELEASE_TABLET | Freq: Three times a day (TID) | ORAL | 3 refills | Status: DC

## 2019-09-27 NOTE — Progress Notes (Signed)
Hematology/Oncology  Follow up note [facility] [phone] [phone]   Patient Care Team: [name], NP as PCP - General (Nurse Practitioner) [name], MD as PCP - Cardiology (Cardiology) Solum, Betsey Holiday, MD as Physician Assistant (Endocrinology) Lavonia Dana, MD as Consulting Physician (Nephrology) [name], MD as Consulting Physician (Cardiology) De Hollingshead, [facility] as Pharmacist  REFERRING PROVIDER: Arlice Colt CHIEF COMPLAINTS/REASON FOR VISIT:  Follow-up for anemia  HISTORY OF PRESENTING ILLNESS:  Tonya [name] is a  73 y.o.  female with PMH listed below who was referred to me for evaluation of anemia Reviewed patient's recent labs that was done at [facility] office. Labs revealed anemia with hemoglobin of 8.9, MCV 82, normal platelet and wbc  Reviewed patient's previous labs ordered by primary care physician's office, anemia is chronic onset , duration is since 2017 No aggravating or improving factors.  Associated signs and symptoms: Patient reports fatigue.  SOB with exertion.  Denies weight loss, easy bruising, hematochezia, hemoptysis, hematuria. Context: History of GI bleeding: denies               History of Chronic kidney disease: CKD, follows up with Dr.Kolluru.                History of autoimmune disease: denies               History of hemolytic anemia. denies               Last colonoscopy: underwent colonoscopy on 06/06/2019.  Due to poor preparation, patient was scheduled to repeat colonoscopy due to suboptimal bowel preparation.  Patient told me that she is waiting for insurance approval for repeat procedure.  INTERVAL HISTORY Tonya [name] is a 73 y.o. female who has above history reviewed by me today presents for follow up visit for management of anemia Problems and complaints are listed below: Patient reports feeling well today.  No new complaints. Chronic fatigue is at baseline.    Review of Systems  Constitutional: Positive for fatigue. Negative for appetite change, chills and fever.  HENT:   Negative for hearing loss and voice change.   Eyes: Negative for eye problems.  Respiratory: Negative for chest tightness and cough.   Cardiovascular: Negative for chest pain.  Gastrointestinal: Negative for abdominal distention, abdominal pain and blood in stool.  Endocrine: Negative for hot flashes.  Genitourinary: Negative for difficulty urinating and frequency.   Musculoskeletal: Negative for arthralgias.  Skin: Negative for itching and rash.  Neurological: Negative for extremity weakness.  Hematological: Negative for adenopathy.  Psychiatric/Behavioral: Negative for confusion.    MEDICAL HISTORY:  Past Medical History:  Diagnosis Date  . (HFpEF) heart failure with preserved ejection fraction ([place])    a. 05/2017 Echo: EF 55-60%, Gr1 DD, mildly dil LA.  . CKD (chronic kidney disease), stage III   . Diabetes mellitus without complication ([place])   . Hyperlipidemia   . Hypertension   . Stroke [facility])    a. 08/2017.    SURGICAL HISTORY: Past Surgical History:  Procedure Laterality Date  . COLONOSCOPY  12/15/2006  . COLONOSCOPY WITH PROPOFOL N/A 06/06/2019   Procedure: COLONOSCOPY WITH PROPOFOL;  Surgeon: [name], MD;  Location: [facility] ENDOSCOPY;  Service: Gastroenterology;  Laterality: N/A;  . TUBAL LIGATION      SOCIAL HISTORY: Social History   Socioeconomic History  . Marital status: Married    Spouse name: Not on file  . Number of children: Not on  file  . Years of education: Not on file  . Highest education level: GED or equivalent  Occupational History  . Occupation: retired  Scientific laboratory technician  . Financial resource strain: Not hard at all  . Food insecurity    Worry: Never true    Inability: Never true  . Transportation needs    Medical: No    Non-medical: No  Tobacco Use  . Smoking status: Never Smoker  . Smokeless tobacco: Never Used  Substance and  Sexual Activity  . Alcohol use: No    Alcohol/week: 0.0 standard drinks  . Drug use: No  . Sexual activity: Yes  Lifestyle  . Physical activity    Days per week: 0 days    Minutes per session: 0 min  . Stress: Not at all  Relationships  . Social connections    Talks on phone: More than three times a week    Gets together: More than three times a week    Attends religious service: More than 4 times per year    Active member of club or organization: Yes    Attends meetings of clubs or organizations: More than 4 times per year    Relationship status: Married  . Intimate partner violence    Fear of current or ex partner: No    Emotionally abused: No    Physically abused: No    Forced sexual activity: No  Other Topics Concern  . Not on file  Social History Narrative  . Not on file    FAMILY HISTORY: Family History  Problem Relation Age of Onset  . Diabetes Mother   . Stroke Mother   . Hyperlipidemia Father   . Hypertension Father   . Diabetes Sister   . Diabetes Brother   . Gout Son   . Diabetes Brother   . Kidney disease Son   . Lymphoma Sister   . Heart attack Sister   . Breast cancer Neg Hx     ALLERGIES:  is allergic to atorvastatin; gabapentin; pravastatin; and trulicity [dulaglutide].  MEDICATIONS:  Current Outpatient Medications  Medication Sig Dispense Refill  . aspirin 81 MG tablet Take 81 mg by mouth daily.    . carvedilol (COREG) 3.125 MG tablet Take 1 tablet (3.125 mg total) by mouth 2 (two) times daily with a meal. 180 tablet 3  . clopidogrel (PLAVIX) 75 MG tablet TAKE 1 TABLET BY MOUTH  DAILY 90 tablet 3  . DOK 100 MG capsule TAKE ONE CAPSULE BY MOUTH EVERY DAY. DO NOT TAKE IF YOU HAVE LOOSE STOOLS 90 capsule 0  . ferrous sulfate 325 (65 FE) MG EC tablet Take 1 tablet (325 mg total) by mouth 3 (three) times daily with meals. 120 tablet 1  . furosemide (LASIX) 40 MG tablet Take 1 tablet (40 mg total) by mouth daily. 90 tablet 3  . HUMALOG KWIKPEN 100  UNIT/ML KiwkPen TK 12 UNITS BEFORE LUNCH AND 8 UNITS BEFORE SUPPER.  11  . insulin detemir (LEVEMIR) 100 UNIT/ML injection Inject 12 Units into the skin every morning.     [name] NOVOFINE 32G X 6 MM MISC USE AS DIRECTED THREE TIMES DAILY WITH HUMALOG 100 each 3  . olmesartan (BENICAR) 40 MG tablet TAKE 1 TABLET(40 MG) BY MOUTH DAILY 90 tablet 1  . omeprazole (PRILOSEC) 20 MG capsule TAKE 1 CAPSULE BY MOUTH  TWICE DAILY 180 capsule 3  . ONE TOUCH ULTRA TEST test strip USE TO TEST BLOOD SUGAR THREE TIMES DAILY 100 each  12  . pioglitazone (ACTOS) 15 MG tablet Take 1 tablet (15 mg total) by mouth daily. 30 tablet 11  . pregabalin (LYRICA) 25 MG capsule Take 1 capsule (25 mg total) by mouth daily. 30 capsule 2  . rosuvastatin (CRESTOR) 5 MG tablet Take 1 tablet (5 mg total) by mouth daily. 90 tablet 3   No current facility-administered medications for this visit.      PHYSICAL EXAMINATION: ECOG PERFORMANCE STATUS: 1 - Symptomatic but completely ambulatory Vitals:   09/27/19 0949  BP: (!) 147/85  Pulse: 92  Resp: 18  Temp: (!) 96.8 F (36 C)   Filed Weights   09/27/19 0949  Weight: 173 lb 9.6 oz (78.7 kg)    Physical Exam Constitutional:      General: She is not in acute distress. HENT:     Head: Normocephalic and atraumatic.  Eyes:     General: No scleral icterus.    Pupils: Pupils are equal, round, and reactive to light.  Neck:     Musculoskeletal: Normal range of motion and neck supple.  Cardiovascular:     Rate and Rhythm: Normal rate and regular rhythm.     Heart sounds: Normal heart sounds.  Pulmonary:     Effort: Pulmonary effort is normal. No respiratory distress.     Breath sounds: No wheezing.  Abdominal:     General: Bowel sounds are normal. There is no distension.     Palpations: Abdomen is soft. There is no mass.     Tenderness: There is no abdominal tenderness.  Musculoskeletal: Normal range of motion.        General: No deformity.  Skin:    General: Skin is  warm and dry.     Findings: No erythema or rash.  Neurological:     Mental Status: She is alert and oriented to person, place, and time.     Cranial Nerves: No cranial nerve deficit.     Coordination: Coordination normal.  Psychiatric:        Behavior: Behavior normal.        Thought Content: Thought content normal.      LABORATORY DATA:  I have reviewed the data as listed Lab Results  Component Value Date   WBC 6.4 09/27/2019   HGB 10.4 (L) 09/27/2019   HCT 33.7 (L) 09/27/2019   MCV 87.3 09/27/2019   PLT 246 09/27/2019   Recent Labs    12/30/18 1012 02/15/19 1405 05/18/19 1105 06/24/19 1331  NA  --  143 145* 142  K  --  5.3* 4.8 4.3  CL  --  108* 109* 112*  CO2  --  21 21 23   GLUCOSE  --  233* 180* 306*  BUN  --  43* 37* 51*  CREATININE  --  2.23* 1.99* 2.24*  CALCIUM  --  9.4 8.9 8.8*  GFRNONAA  --  22* 25* 21*  GFRAA  --  25* 29* 25*  PROT 6.2* 6.1 5.6* 6.3*  ALBUMIN 3.3* 3.7 3.6* 3.4*  AST 22 14 21 16   ALT 14 11 20 12   ALKPHOS 93 92 114 96  BILITOT 0.7 <0.2 <0.2 0.4  BILIDIR <0.1  --   --   --   IBILI NOT CALCULATED  --   --   --    Iron/TIBC/Ferritin/ %Sat    Component Value Date/Time   IRON 62 09/27/2019 0936   IRON 48 02/15/2019 1405   TIBC 305 09/27/2019 0936   TIBC 438 02/15/2019 1405  FERRITIN 96 09/27/2019 0936   FERRITIN 15 02/15/2019 1405   IRONPCTSAT 20 09/27/2019 0936   IRONPCTSAT 11 (L) 02/15/2019 1405        ASSESSMENT & PLAN:  1. Iron deficiency anemia due to chronic blood loss   2. Anemia due to stage 4 chronic kidney disease ([place])   Labs are reviewed and discussed with patient. Hemoglobin has improved to 10.4, iron panel shows stable ferritin at 96, iron saturation of 20.  I had a discussion with patient that her anemia is multifactorial, secondary to iron deficiency as well as anemia due to stage IV chronic kidney disease. For now hemoglobin is above 10 with stable iron panel. I recommend patient to continue take oral iron  supplementation ferrous sulfate 325 mg 2-3 times a day. Take Colace daily for constipation.  Chronic kidney disease.  Continue avoid nephrotoxic.  Follow-up with nephrology.  #Pending colonoscopy work-up.  I would wait till she has colonoscopy to rule out any colon mass prior to starting erythropoietin replacement. Patient was advised to call and update [place] after she has her schedule for colonoscopy.   Orders Placed This Encounter  Procedures  . CBC with Differential/Platelet    Standing Status:   Future    Standing Expiration Date:   09/26/2020  . Iron and TIBC    Standing Status:   Future    Standing Expiration Date:   09/26/2020  . Ferritin    Standing Status:   Future    Standing Expiration Date:   09/26/2020  . Retic Panel    Standing Status:   Future    Standing Expiration Date:   09/26/2020    All questions were answered. The patient knows to call the clinic with any problems questions or concerns.  Return of visit: 3 months    Earlie Server, MD, PhD Hematology Oncology [facility] Pager- [phone] 09/27/2019

## 2019-09-30 ENCOUNTER — Other Ambulatory Visit: Payer: Self-pay

## 2019-09-30 ENCOUNTER — Encounter: Payer: Self-pay | Admitting: Nurse Practitioner

## 2019-09-30 ENCOUNTER — Ambulatory Visit (INDEPENDENT_AMBULATORY_CARE_PROVIDER_SITE_OTHER): Payer: Medicare Other | Admitting: Nurse Practitioner

## 2019-09-30 VITALS — BP 138/82 | HR 82 | Temp 99.5°F

## 2019-09-30 DIAGNOSIS — E1140 Type 2 diabetes mellitus with diabetic neuropathy, unspecified: Secondary | ICD-10-CM | POA: Diagnosis not present

## 2019-09-30 DIAGNOSIS — E1165 Type 2 diabetes mellitus with hyperglycemia: Secondary | ICD-10-CM | POA: Diagnosis not present

## 2019-09-30 DIAGNOSIS — E785 Hyperlipidemia, unspecified: Secondary | ICD-10-CM

## 2019-09-30 DIAGNOSIS — Z794 Long term (current) use of insulin: Secondary | ICD-10-CM

## 2019-09-30 DIAGNOSIS — I1310 Hypertensive heart and chronic kidney disease without heart failure, with stage 1 through stage 4 chronic kidney disease, or unspecified chronic kidney disease: Secondary | ICD-10-CM | POA: Diagnosis not present

## 2019-09-30 DIAGNOSIS — Z23 Encounter for immunization: Secondary | ICD-10-CM | POA: Diagnosis not present

## 2019-09-30 DIAGNOSIS — B0229 Other postherpetic nervous system involvement: Secondary | ICD-10-CM

## 2019-09-30 DIAGNOSIS — E1169 Type 2 diabetes mellitus with other specified complication: Secondary | ICD-10-CM

## 2019-09-30 DIAGNOSIS — N184 Chronic kidney disease, stage 4 (severe): Secondary | ICD-10-CM

## 2019-09-30 DIAGNOSIS — D509 Iron deficiency anemia, unspecified: Secondary | ICD-10-CM

## 2019-09-30 MED ORDER — PREGABALIN 50 MG PO CAPS: 50.0000 mg | ORAL_CAPSULE | Freq: Every day | ORAL | 1 refills | Status: DC

## 2019-09-30 NOTE — Assessment & Plan Note (Signed)
Chronic, ongoing.  Continue current medication regimen and collaboration with nephrology.  CMP next visit.

## 2019-09-30 NOTE — Assessment & Plan Note (Signed)
Chronic, ongoing.  Recent A1C with endo 8.6% and changes made. Continue current medication regimen and consider reduction if increased hypoglycemic episodes.  Continue to collaborate with Dr. [name] and CCM team.  Labs next visit.

## 2019-09-30 NOTE — Assessment & Plan Note (Signed)
Chronic, ongoing.  June LDL 73. Continue rosuvastatin. Continue to collaborate with [facility].

## 2019-09-30 NOTE — Progress Notes (Signed)
BP 138/82 (BP Location: Left Arm, Patient Position: Sitting)   Pulse 82   Temp 99.5 F (37.5 C) (Oral)   LMP  (LMP Unknown)   SpO2 100%    Subjective:    Patient ID: Tonya [name], female    DOB: 04/04/1946, 73 y.o.   MRN: [id number]  HPI: Tonya [name] is a 73 y.o. female  Chief Complaint  Patient presents with  . Diabetes  . Hyperlipidemia  . Hypertension   DIABETES Last saw Dr. [name] 09/21/2019 with A1C 8.6%, her Actos was increased to 30 mg. Does endorse she has been eating differently and focused more on diet. Hypoglycemic episodes:she states not lately, but has in past Polydipsia/polyuria: no Visual disturbance: no Chest pain: no Paresthesias: no Glucose Monitoring: yes             Accucheck frequency: TID             Fasting glucose: at times 70 and average 90's, sometimes a little higher (190)             Post prandial:             Evening: often in the 200 range in evening             Before meals: 150 range often Taking Insulin?: yes             Long acting insulin: Levemir 12 units in morning             Short acting insulin: Humalog 8 units Blood Pressure Monitoring: a few times a week Retinal Examination: Up to Date Foot Exam: Up to Date Pneumovax: Up to Date Influenza: Up to Date Aspirin: yes   HYPERTENSION / HYPERLIPIDEMIA Followed by cardiology, Dr. [name] Situ.  Last saw him in January 2020.  At last visit had not been taking Olmesartan and was only taking Carvedilol once a day, a pill box was provided to patient at that time.  Reports her daughter is putting pills in pill box.  Has been using day and night box.  Has not taken medication this morning. Satisfied with current treatment? yes Duration of hypertension: chronic BP monitoring frequency: a few times a week BP range: 130-140/80's on average and she reports occasional SBP 160 BP medication side effects: no Duration of hyperlipidemia: chronic Cholesterol medication side effects: no  Cholesterol supplements: none Medication compliance: good compliance Aspirin: yes Recent stressors: no Recurrent headaches: no Visual changes: no Palpitations: no Dyspnea: no Chest pain: no Lower extremity edema: no Dizzy/lightheaded: no   CHRONIC KIDNEY DISEASE Followed by nephrology and last seen 08/31/2019 with CRT 1.92 and GFR 30 in June, ordered that an additional 1/2 tab of Lasix could be taken if increased edema.  Is also followed by hematology for chronic disease anemia and last seen 09/27/19. CKD status: stable Medications renally dose: yes Previous renal evaluation: yes Pneumovax:  Up to Date Influenza Vaccine:  Up to Date   POSTHERPETIC NEURALGIA: Started on Pregabalin 25 MG in June and she reports she feels she walks "a little better" and sleeps better at night, but still not 100%.  Continues to have occasional pain and inquires into increased Lyrica dose.  Her pain is intermittent, sharp, burning to lower back.  8/10 at worst and 2/10 at best.  Her CrCl recently 28.50.  Does report medication makes her tired, so she is taking at night.  Had shingles years ago and since that time has had ongoing issues with pain  to lower right back.  She is intolerant to Gabapentin.  Has history of imaging to lumbar spine in 2016, where her pain is, and it showed "diffuse multilevel degenerative changes lumbar spine"  Relevant past medical, surgical, family and social history reviewed and updated as indicated. Interim medical history since our last visit reviewed. Allergies and medications reviewed and updated.  Review of Systems  Constitutional: Negative for activity change, appetite change, diaphoresis, fatigue and fever.  Respiratory: Negative for cough, chest tightness and shortness of breath.   Cardiovascular: Negative for chest pain, palpitations and leg swelling.  Gastrointestinal: Negative for abdominal distention, abdominal pain, constipation, diarrhea, nausea and vomiting.   Endocrine: Negative for cold intolerance, heat intolerance, polydipsia, polyphagia and polyuria.  Musculoskeletal: Positive for back pain.  Neurological: Negative for dizziness, syncope, weakness, light-headedness, numbness and headaches.  Psychiatric/Behavioral: Negative.     Per HPI unless specifically indicated above     Objective:    BP 138/82 (BP Location: Left Arm, Patient Position: Sitting)   Pulse 82   Temp 99.5 F (37.5 C) (Oral)   LMP  (LMP Unknown)   SpO2 100%   Wt Readings from Last 3 Encounters:  09/27/19 173 lb 9.6 oz (78.7 kg)  07/06/19 174 lb (78.9 kg)  06/27/19 174 lb 4 oz (79 kg)    Physical Exam Vitals signs and nursing note reviewed.  Constitutional:      General: She is awake. She is not in acute distress.    Appearance: She is well-developed. She is not ill-appearing.  HENT:     Head: Normocephalic.     Right Ear: Hearing normal.     Left Ear: Hearing normal.  Eyes:     General: Lids are normal.        Right eye: No discharge.        Left eye: No discharge.     Conjunctiva/sclera: Conjunctivae normal.     Pupils: Pupils are equal, round, and reactive to light.  Neck:     Musculoskeletal: Normal range of motion and neck supple.     Thyroid: No thyromegaly.     Vascular: No carotid bruit.  Cardiovascular:     Rate and Rhythm: Normal rate and regular rhythm.     Heart sounds: Normal heart sounds. No murmur. No gallop.   Pulmonary:     Effort: Pulmonary effort is normal. No accessory muscle usage or respiratory distress.     Breath sounds: Normal breath sounds.  Abdominal:     General: Bowel sounds are normal.     Palpations: Abdomen is soft.  Musculoskeletal:     Lumbar back: She exhibits tenderness. She exhibits normal range of motion, no swelling, no edema, no laceration and no pain.     Right lower leg: No edema.     Left lower leg: No edema.  Skin:    General: Skin is warm and dry.     Findings: No rash.  Neurological:     Mental  Status: She is alert and oriented to person, place, and time.  Psychiatric:        Attention and Perception: Attention normal.        Mood and Affect: Mood normal.        Behavior: Behavior normal. Behavior is cooperative.        Thought Content: Thought content normal.        Judgment: Judgment normal.     Results for orders placed or performed in visit on 09/27/19  Iron  and TIBC  Result Value Ref Range   Iron 62 28 - 170 ug/dL   TIBC 305 250 - 450 ug/dL   Saturation Ratios 20 10.4 - 31.8 %   UIBC 243 ug/dL  Retic Panel  Result Value Ref Range   Retic Ct Pct 1.8 0.4 - 3.1 %   RBC. 3.86 (L) 3.87 - 5.11 MIL/uL   Retic Count, Absolute 68.3 19.0 - 186.0 K/uL   Immature Retic Fract 12.8 2.3 - 15.9 %   Reticulocyte Hemoglobin 31.6 >27.9 pg  CBC  Result Value Ref Range   WBC 6.4 4.0 - 10.5 K/uL   RBC 3.86 (L) 3.87 - 5.11 MIL/uL   Hemoglobin 10.4 (L) 12.0 - 15.0 g/dL   HCT 33.7 (L) 36.0 - 46.0 %   MCV 87.3 80.0 - 100.0 fL   MCH 26.9 26.0 - 34.0 pg   MCHC 30.9 30.0 - 36.0 g/dL   RDW 14.0 11.5 - 15.5 %   Platelets 246 150 - 400 K/uL   nRBC 0.0 0.0 - 0.2 %  Ferritin  Result Value Ref Range   Ferritin 96 11 - 307 ng/mL      Assessment & Plan:   Problem List Items Addressed This Visit      Cardiovascular and Mediastinum   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  BP slightly above goal today initially (has not taken morning medication) but repeat at goal.  Reports she is taking medication as ordered.  Continue current medication regimen.  CMP next visit.  Continue to collaborate with nephrology and cardiology.        Endocrine   Poorly controlled type 2 diabetes mellitus with neuropathy (HCC)    Chronic, ongoing.  Recent A1C with endo 8.6% and changes made. Continue current medication regimen and adjust as needed.  Continue to collaborate with Dr. [name] and CCM team.  Labs next visit.      Hyperlipidemia associated  with type 2 diabetes mellitus (HCC)    Chronic, ongoing.  June LDL 73. Continue rosuvastatin. Continue to collaborate with [facility].        Uncontrolled type 2 diabetes mellitus with hyperglycemia, with long-term current use of insulin (HCC) - Primary    Chronic, ongoing.  Recent A1C with endo 8.6% and changes made. Continue current medication regimen and consider reduction if increased hypoglycemic episodes.  Continue to collaborate with Dr. [name] and CCM team.  Labs next visit.        Nervous and Auditory   Post herpetic neuralgia    Chronic, ongoing.  Will trial increase in Lyrica to 50 MG every night.  Script sent.  Monitor kidney function and adjust dose as needed.        Genitourinary   Chronic kidney disease, stage 4, severely decreased GFR (HCC)    Chronic, ongoing.  Continue current medication regimen and collaboration with nephrology.  CMP next visit.        Other   Iron deficiency anemia    Chronic with CKD.  Continue iron supplement daily and collaboration with hematology.       Other Visit Diagnoses    Need for influenza vaccination       Relevant Orders   Flu Vaccine QUAD High Dose(Fluad) (Completed)      Time: 25 minutes, >50% spent counseling/ on medication regimen and changes  Follow up plan: Return in about 3 months (around 12/31/2019) for T2DM, HTN/HLD, Pain,  CKD.

## 2019-09-30 NOTE — Assessment & Plan Note (Signed)
Chronic, ongoing.  Will trial increase in Lyrica to 50 MG every night.  Script sent.  Monitor kidney function and adjust dose as needed.

## 2019-09-30 NOTE — Assessment & Plan Note (Signed)
Chronic, ongoing.  BP slightly above goal today initially (has not taken morning medication) but repeat at goal.  Reports she is taking medication as ordered.  Continue current medication regimen.  CMP next visit.  Continue to collaborate with nephrology and cardiology.

## 2019-09-30 NOTE — Assessment & Plan Note (Signed)
Chronic with CKD.  Continue iron supplement daily and collaboration with hematology.

## 2019-09-30 NOTE — Patient Instructions (Addendum)
Start taking two Lyrica tablets (50 MG total) at night.  I will send in refill for this dose, but you will need to call to have this filled once out of 25 MG tablets.  Influenza (Flu) Vaccine (Inactivated or Recombinant): What You Need to Know 1. Why get vaccinated? Influenza vaccine can prevent influenza (flu). Flu is a contagious disease that spreads around the [place] every year, usually between October and May. Anyone can get the flu, but it is more dangerous for some people. Infants and young children, people 71 years of age and older, pregnant women, and people with certain health conditions or a weakened immune system are at greatest risk of flu complications. Pneumonia, bronchitis, sinus infections and ear infections are examples of flu-related complications. If you have a medical condition, such as heart disease, cancer or diabetes, flu can make it worse. Flu can cause fever and chills, sore throat, muscle aches, fatigue, cough, headache, and runny or stuffy nose. Some people may have vomiting and diarrhea, though this is more common in children than adults. Each year thousands of people in the [place] die from flu, and many more are hospitalized. Flu vaccine prevents millions of illnesses and flu-related visits to the doctor each year. 2. Influenza vaccine CDC recommends everyone 71 months of age and older get vaccinated every flu season. Children 6 months through 46 years of age may need 2 doses during a single flu season. Everyone else needs only 1 dose each flu season. It takes about 2 weeks for protection to develop after vaccination. There are many flu viruses, and they are always changing. Each year a new flu vaccine is made to protect against three or four viruses that are likely to cause disease in the upcoming flu season. Even when the vaccine doesn't exactly match these viruses, it may still provide some protection. Influenza vaccine does not cause flu. Influenza vaccine  may be given at the same time as other vaccines. 3. Talk with your health care provider Tell your vaccine provider if the person getting the vaccine:  Has had an allergic reaction after a previous dose of influenza vaccine, or has any severe, life-threatening allergies.  Has ever had Guillain-Barr Syndrome (also called GBS). In some cases, your health care provider may decide to postpone influenza vaccination to a future visit. People with minor illnesses, such as a cold, may be vaccinated. People who are moderately or severely ill should usually wait until they recover before getting influenza vaccine. Your health care provider can give you more information. 4. Risks of a vaccine reaction  Soreness, redness, and swelling where shot is given, fever, muscle aches, and headache can happen after influenza vaccine.  There may be a very small increased risk of Guillain-Barr Syndrome (GBS) after inactivated influenza vaccine (the flu shot). Young children who get the flu shot along with pneumococcal vaccine (PCV13), and/or DTaP vaccine at the same time might be slightly more likely to have a seizure caused by fever. Tell your health care provider if a child who is getting flu vaccine has ever had a seizure. People sometimes faint after medical procedures, including vaccination. Tell your provider if you feel dizzy or have vision changes or ringing in the ears. As with any medicine, there is a very remote chance of a vaccine causing a severe allergic reaction, other serious injury, or death. 5. What if there is a serious problem? An allergic reaction could occur after the vaccinated person leaves the clinic. If you  see signs of a severe allergic reaction (hives, swelling of the face and throat, difficulty breathing, a fast heartbeat, dizziness, or weakness), call 9-1-1 and get the person to the nearest hospital. For other signs that concern you, call your health care provider. Adverse reactions  should be reported to the Vaccine Adverse Event Reporting System (VAERS). Your health care provider will usually file this report, or you can do it yourself. Visit the VAERS website at www.vaers.SamedayNews.es or call (727)832-8830.VAERS is only for reporting reactions, and VAERS staff do not give medical advice. 6. The National Vaccine Injury Compensation Program The [employer] Vaccine Injury Compensation Program (VICP) is a federal program that was created to compensate people who may have been injured by certain vaccines. Visit the VICP website at GoldCloset.com.ee or call (657)315-7249 to learn about the program and about filing a claim. There is a time limit to file a claim for compensation. 7. How can I learn more?  Ask your healthcare provider.  Call your local or state health department.  Contact the Centers for Disease Control and Prevention (CDC): ? Call 782-738-8745 (1-800-CDC-INFO) or ? Visit CDC's https://gibson.com/ Vaccine Information Statement (Interim) Inactivated Influenza Vaccine (07/29/2018) This information is not intended to replace advice given to you by your health care provider. Make sure you discuss any questions you have with your health care provider. Document Released: 09/25/2006 Document Revised: 03/22/2019 Document Reviewed: 08/02/2018 Elsevier Patient Education  2020 [employer].

## 2019-09-30 NOTE — Assessment & Plan Note (Addendum)
Chronic, ongoing.  Recent A1C with endo 8.6% and changes made. Continue current medication regimen and adjust as needed.  Continue to collaborate with Dr. [name] and CCM team.  Labs next visit.

## 2019-10-17 ENCOUNTER — Other Ambulatory Visit: Payer: Self-pay | Admitting: Nurse Practitioner

## 2019-10-17 MED ORDER — PREGABALIN 50 MG PO CAPS: 50.0000 mg | ORAL_CAPSULE | Freq: Every day | ORAL | 1 refills | Status: DC

## 2019-10-17 NOTE — Telephone Encounter (Signed)
Routing to provider  

## 2019-10-17 NOTE — Telephone Encounter (Signed)
pregabalin (LYRICA) 50 MG capsule   Medication need to be sent to [facility] and not [facility]

## 2019-10-26 ENCOUNTER — Ambulatory Visit: Payer: Self-pay | Admitting: Pharmacist

## 2019-10-26 NOTE — Patient Instructions (Addendum)
Tonya [name],   It was great talking to you today!  Here is what I have for your medications right now- please share this with your daughter to make sure we are on the same page and call me if there are any questions:  Morning: - Aspirin 81 mg  - Carvedilol 3.125 mg  - Clopidogrel 75 mg  - Docusate 100 mg (constipation) - Ferrous sulfate 325 mg (iron supplement) - Furosemide 40 mg  - Olmesartan 40 mg  - Omeprazole 20 mg  - Pioglitazone 30 mg  - Rosuvastatin 5 mg   Evening: - Carvedilol 3.125 mg - Ferrous sulfate 325 mg (iron supplement) - Pregabalin 50 mg   I think it will help [place] better track your blood sugars and help you better control your diabetes if you start writing down your blood sugar readings. Please check your sugar first thing in the morning before breakfast, before supper, and 2 hours after supper, if you can. If you check at different times - that's OK! Write those down in the corresponding column.   I'll call you next month to touch base!  Visit Information  Goals Addressed            This Visit's Progress     Patient Stated   . "I need help managing my blood sugars" (pt-stated)       Current Barriers:  . Diabetes: uncontrolled but improved, most recent A1c 8.6%  o Follows with Dr. [name] at [facility]; at last visit, pioglitazone dose was increased  . Current antihyperglycemic regimen: pioglitazone 30 mg daily, Levemir 12 units daily, Humalog sliding scale - if BG <100, no insulin, if 100-200, 8 units, if >200, then 12 units . Current blood glucose readings: Notes readings have been going well, but then reports some readings in 200-300s   . Cardiovascular risk reduction: o Current hypertensive regimen: olmesartan 40 mg daily, carvedilol 3.125 mg BID; furosemide 40 mg QAM o Current hyperlipidemia regimen: rosuvastatin 5 mg daily; last LDL 73   Pharmacist Clinical Goal(s):  [name] Over the next 90 days, patient with work with PharmD and primary care provider  to address optimized medication management  Interventions: . Comprehensive medication review performed, medication list updated in electronic medical record . Refill histories per Epic may indicate nonadherence, though patient confirms that she has all of her pill bottles and that her daughter fills her pill box weekly. Mailing updated medication list to patient for her to pass along to daughter to ensure accuracy  o AM: carvedilol, clopidogrel, docusate, ferrous sulfate, furosemide, olmesartan, omeprazole, pioglitazone, rosuvastatin o PM: carvedilol, ferrous sulfate, pregabalin . Concerned for report of post-prandial reading >300. Encouraged patient to start writing down BG readings to review. Recommended she check fasting and 2 hour post prandial. Mailing BG logs to patient  Patient Self Care Activities:  . Patient will check blood glucose daily, document, and provide at future appointments . Patient will focus on medication adherence by using her pill box . Patient will contact provider with any episodes of hypoglycemia . Patient will report any questions or concerns to provider   Please see past updates related to this goal by clicking on the "Past Updates" button in the selected goal         The patient verbalized understanding of instructions provided today and declined a print copy of patient instruction materials.   Plan:  - Will follow up with patient in the next 3-5 weeks for medication management support  [name],  PharmD Clinical Pharmacist [place] [phone]

## 2019-10-26 NOTE — Chronic Care Management (AMB) (Signed)
Chronic Care Management   Follow Up Note   10/26/2019 Name: Tonya [name] MRN: [id number] DOB: 1946/10/27  Referred by: [name], NP Reason for referral : Chronic Care Management (Medication Management)   Tonya [name] is a 73 y.o. year old female who is a primary care patient of Cannady, [name], NP. The CCM team was consulted for assistance with chronic disease management and care coordination needs.    Contacted Review of patient status, including review of consultants reports, relevant laboratory and other test results, and collaboration with appropriate care team members and the patient's provider was performed as part of comprehensive patient evaluation and provision of chronic care management services.    SDOH (Social Determinants of Health) screening performed today: None. See Care Plan for related entries.   Outpatient Encounter Medications as of 10/26/2019  Medication Sig Note  . aspirin 81 MG tablet Take 81 mg by mouth daily.   . carvedilol (COREG) 3.125 MG tablet Take 1 tablet (3.125 mg total) by mouth 2 (two) times daily with a meal.   . clopidogrel (PLAVIX) 75 MG tablet TAKE 1 TABLET BY MOUTH  DAILY   . DOK 100 MG capsule TAKE ONE CAPSULE BY MOUTH EVERY DAY. DO NOT TAKE IF YOU HAVE LOOSE STOOLS   . ferrous sulfate 325 (65 FE) MG EC tablet Take 1 tablet (325 mg total) by mouth 3 (three) times daily with meals.   . furosemide (LASIX) 40 MG tablet Take 1 tablet (40 mg total) by mouth daily.   [name] HUMALOG KWIKPEN 100 UNIT/ML KiwkPen TK 12 UNITS BEFORE LUNCH AND 8 UNITS BEFORE SUPPER. 10/26/2019: 8-10 units with meals  . insulin detemir (LEVEMIR) 100 UNIT/ML injection Inject 12 Units into the skin every morning.    [name] NOVOFINE 32G X 6 MM MISC USE AS DIRECTED THREE TIMES DAILY WITH HUMALOG   . olmesartan (BENICAR) 40 MG tablet TAKE 1 TABLET(40 MG) BY MOUTH DAILY   . omeprazole (PRILOSEC) 20 MG capsule TAKE 1 CAPSULE BY MOUTH  TWICE DAILY   . ONE TOUCH ULTRA TEST  test strip USE TO TEST BLOOD SUGAR THREE TIMES DAILY   . pioglitazone (ACTOS) 30 MG tablet Take 30 mg by mouth daily.   . pregabalin (LYRICA) 50 MG capsule Take 1 capsule (50 mg total) by mouth at bedtime.   . rosuvastatin (CRESTOR) 5 MG tablet Take 1 tablet (5 mg total) by mouth daily.   . [DISCONTINUED] pioglitazone (ACTOS) 15 MG tablet Take 1 tablet (15 mg total) by mouth daily.    No facility-administered encounter medications on file as of 10/26/2019.      Goals Addressed            This Visit's Progress     Patient Stated   . "I need help managing my blood sugars" (pt-stated)       Current Barriers:  . Diabetes: uncontrolled but improved, most recent A1c 8.6%  o [name] at [facility]; at last visit, pioglitazone dose was increased  . Current antihyperglycemic regimen: pioglitazone 30 mg daily, Levemir 12 units daily, Humalog sliding scale - if BG <100, no insulin, if 100-200, 8 units, if >200, then 12 units . Current blood glucose readings: Notes readings have been going well, but then reports some readings in 200-300s   . Cardiovascular risk reduction: o Current hypertensive regimen: olmesartan 40 mg daily, carvedilol 3.125 mg BID; furosemide 40 mg QAM o Current hyperlipidemia regimen: rosuvastatin 5 mg daily; last LDL  73   Pharmacist Clinical Goal(s):  [name] Over the next 90 days, patient with work with PharmD and primary care provider to address optimized medication management  Interventions: . Comprehensive medication review performed, medication list updated in electronic medical record . Refill histories per Epic may indicate nonadherence, though patient confirms that she has all of her pill bottles and that her daughter fills her pill box weekly. Mailing updated medication list to patient for her to pass along to daughter to ensure accuracy  o AM: carvedilol, clopidogrel, docusate, ferrous sulfate, furosemide, olmesartan, omeprazole, pioglitazone,  rosuvastatin o PM: carvedilol, ferrous sulfate, pregabalin . Concerned for report of post-prandial reading >300. Encouraged patient to start writing down BG readings to review. Recommended she check fasting and 2 hour post prandial. Mailing BG logs to patient  Patient Self Care Activities:  . Patient will check blood glucose daily, document, and provide at future appointments . Patient will focus on medication adherence by using her pill box . Patient will contact provider with any episodes of hypoglycemia . Patient will report any questions or concerns to provider   Please see past updates related to this goal by clicking on the "Past Updates" button in the selected goal          Plan:  - Will follow up with patient in the next 3-5 weeks for medication management support  [name], PharmD Clinical Pharmacist [place] [phone]

## 2019-12-01 ENCOUNTER — Other Ambulatory Visit: Payer: Self-pay

## 2019-12-01 ENCOUNTER — Encounter: Payer: Self-pay | Admitting: Internal Medicine

## 2019-12-01 ENCOUNTER — Emergency Department: Payer: Medicare Other

## 2019-12-01 ENCOUNTER — Inpatient Hospital Stay
Admission: EM | Admit: 2019-12-01 | Discharge: 2019-12-03 | DRG: 092 | Disposition: A | Discharge status: Home Health | Payer: Medicare Other | Attending: Internal Medicine | Admitting: Internal Medicine

## 2019-12-01 DIAGNOSIS — R4182 Altered mental status, unspecified: Secondary | ICD-10-CM | POA: Diagnosis present

## 2019-12-01 DIAGNOSIS — R4789 Other speech disturbances: Secondary | ICD-10-CM | POA: Diagnosis present

## 2019-12-01 DIAGNOSIS — Z888 Allergy status to other drugs, medicaments and biological substances status: Secondary | ICD-10-CM | POA: Diagnosis not present

## 2019-12-01 DIAGNOSIS — R5381 Other malaise: Secondary | ICD-10-CM | POA: Diagnosis not present

## 2019-12-01 DIAGNOSIS — R4701 Aphasia: Secondary | ICD-10-CM | POA: Diagnosis present

## 2019-12-01 DIAGNOSIS — Z20828 Contact with and (suspected) exposure to other viral communicable diseases: Secondary | ICD-10-CM | POA: Diagnosis present

## 2019-12-01 DIAGNOSIS — E1165 Type 2 diabetes mellitus with hyperglycemia: Secondary | ICD-10-CM | POA: Diagnosis present

## 2019-12-01 DIAGNOSIS — Z7982 Long term (current) use of aspirin: Secondary | ICD-10-CM

## 2019-12-01 DIAGNOSIS — E1169 Type 2 diabetes mellitus with other specified complication: Secondary | ICD-10-CM | POA: Diagnosis present

## 2019-12-01 DIAGNOSIS — R2681 Unsteadiness on feet: Secondary | ICD-10-CM | POA: Diagnosis present

## 2019-12-01 DIAGNOSIS — I639 Cerebral infarction, unspecified: Secondary | ICD-10-CM | POA: Diagnosis not present

## 2019-12-01 DIAGNOSIS — N184 Chronic kidney disease, stage 4 (severe): Secondary | ICD-10-CM | POA: Diagnosis present

## 2019-12-01 DIAGNOSIS — Z794 Long term (current) use of insulin: Secondary | ICD-10-CM

## 2019-12-01 DIAGNOSIS — D509 Iron deficiency anemia, unspecified: Secondary | ICD-10-CM | POA: Diagnosis present

## 2019-12-01 DIAGNOSIS — Z823 Family history of stroke: Secondary | ICD-10-CM

## 2019-12-01 DIAGNOSIS — F419 Anxiety disorder, unspecified: Secondary | ICD-10-CM | POA: Diagnosis present

## 2019-12-01 DIAGNOSIS — I361 Nonrheumatic tricuspid (valve) insufficiency: Secondary | ICD-10-CM | POA: Diagnosis not present

## 2019-12-01 DIAGNOSIS — R471 Dysarthria and anarthria: Principal | ICD-10-CM | POA: Diagnosis present

## 2019-12-01 DIAGNOSIS — R262 Difficulty in walking, not elsewhere classified: Secondary | ICD-10-CM | POA: Diagnosis not present

## 2019-12-01 DIAGNOSIS — Z79899 Other long term (current) drug therapy: Secondary | ICD-10-CM

## 2019-12-01 DIAGNOSIS — Z743 Need for continuous supervision: Secondary | ICD-10-CM | POA: Diagnosis not present

## 2019-12-01 DIAGNOSIS — I130 Hypertensive heart and chronic kidney disease with heart failure and stage 1 through stage 4 chronic kidney disease, or unspecified chronic kidney disease: Secondary | ICD-10-CM | POA: Diagnosis present

## 2019-12-01 DIAGNOSIS — Z833 Family history of diabetes mellitus: Secondary | ICD-10-CM | POA: Diagnosis not present

## 2019-12-01 DIAGNOSIS — B0229 Other postherpetic nervous system involvement: Secondary | ICD-10-CM | POA: Diagnosis present

## 2019-12-01 DIAGNOSIS — E1122 Type 2 diabetes mellitus with diabetic chronic kidney disease: Secondary | ICD-10-CM | POA: Diagnosis present

## 2019-12-01 DIAGNOSIS — Z7902 Long term (current) use of antithrombotics/antiplatelets: Secondary | ICD-10-CM

## 2019-12-01 DIAGNOSIS — F32A Depression, unspecified: Secondary | ICD-10-CM | POA: Diagnosis present

## 2019-12-01 DIAGNOSIS — I63233 Cerebral infarction due to unspecified occlusion or stenosis of bilateral carotid arteries: Secondary | ICD-10-CM | POA: Diagnosis not present

## 2019-12-01 DIAGNOSIS — T426X5A Adverse effect of other antiepileptic and sedative-hypnotic drugs, initial encounter: Secondary | ICD-10-CM | POA: Diagnosis present

## 2019-12-01 DIAGNOSIS — F329 Major depressive disorder, single episode, unspecified: Secondary | ICD-10-CM | POA: Diagnosis present

## 2019-12-01 DIAGNOSIS — E785 Hyperlipidemia, unspecified: Secondary | ICD-10-CM | POA: Diagnosis present

## 2019-12-01 DIAGNOSIS — R4702 Dysphasia: Secondary | ICD-10-CM | POA: Diagnosis not present

## 2019-12-01 DIAGNOSIS — R269 Unspecified abnormalities of gait and mobility: Secondary | ICD-10-CM | POA: Diagnosis not present

## 2019-12-01 DIAGNOSIS — Z8673 Personal history of transient ischemic attack (TIA), and cerebral infarction without residual deficits: Secondary | ICD-10-CM | POA: Diagnosis not present

## 2019-12-01 DIAGNOSIS — I5032 Chronic diastolic (congestive) heart failure: Secondary | ICD-10-CM | POA: Diagnosis present

## 2019-12-01 DIAGNOSIS — Z8349 Family history of other endocrine, nutritional and metabolic diseases: Secondary | ICD-10-CM

## 2019-12-01 DIAGNOSIS — Z8249 Family history of ischemic heart disease and other diseases of the circulatory system: Secondary | ICD-10-CM

## 2019-12-01 DIAGNOSIS — Z03818 Encounter for observation for suspected exposure to other biological agents ruled out: Secondary | ICD-10-CM | POA: Diagnosis not present

## 2019-12-01 DIAGNOSIS — R29701 NIHSS score 1: Secondary | ICD-10-CM | POA: Diagnosis present

## 2019-12-01 DIAGNOSIS — I10 Essential (primary) hypertension: Secondary | ICD-10-CM | POA: Diagnosis not present

## 2019-12-01 LAB — CBC WITH DIFFERENTIAL/PLATELET
Abs Immature Granulocytes: 0.03 10*3/uL (ref 0.00–0.07)
Basophils Absolute: 0 10*3/uL (ref 0.0–0.1)
Basophils Relative: 0 %
Eosinophils Absolute: 0 10*3/uL (ref 0.0–0.5)
Eosinophils Relative: 1 %
HCT: 33.2 % — ABNORMAL LOW (ref 36.0–46.0)
Hemoglobin: 10.2 g/dL — ABNORMAL LOW (ref 12.0–15.0)
Immature Granulocytes: 0 %
Lymphocytes Relative: 12 %
Lymphs Abs: 1.1 10*3/uL (ref 0.7–4.0)
MCH: 26.9 pg (ref 26.0–34.0)
MCHC: 30.7 g/dL (ref 30.0–36.0)
MCV: 87.6 fL (ref 80.0–100.0)
Monocytes Absolute: 0.3 10*3/uL (ref 0.1–1.0)
Monocytes Relative: 3 %
Neutro Abs: 7.1 10*3/uL (ref 1.7–7.7)
Neutrophils Relative %: 84 %
Platelets: 232 10*3/uL (ref 150–400)
RBC: 3.79 MIL/uL — ABNORMAL LOW (ref 3.87–5.11)
RDW: 15.2 % (ref 11.5–15.5)
WBC: 8.6 10*3/uL (ref 4.0–10.5)
nRBC: 0 % (ref 0.0–0.2)

## 2019-12-01 LAB — COMPREHENSIVE METABOLIC PANEL
ALT: 17 U/L (ref 0–44)
AST: 25 U/L (ref 15–41)
Albumin: 3.5 g/dL (ref 3.5–5.0)
Alkaline Phosphatase: 85 U/L (ref 38–126)
Anion gap: 10 (ref 5–15)
BUN: 55 mg/dL — ABNORMAL HIGH (ref 8–23)
CO2: 21 mmol/L — ABNORMAL LOW (ref 22–32)
Calcium: 9.1 mg/dL (ref 8.9–10.3)
Chloride: 113 mmol/L — ABNORMAL HIGH (ref 98–111)
Creatinine, Ser: 2.15 mg/dL — ABNORMAL HIGH (ref 0.44–1.00)
GFR calc Af Amer: 26 mL/min — ABNORMAL LOW (ref >60)
GFR calc non Af Amer: 22 mL/min — ABNORMAL LOW (ref >60)
Glucose, Bld: 143 mg/dL — ABNORMAL HIGH (ref 70–99)
Potassium: 5.1 mmol/L (ref 3.5–5.1)
Sodium: 144 mmol/L (ref 135–145)
Total Bilirubin: 0.6 mg/dL (ref 0.3–1.2)
Total Protein: 6.9 g/dL (ref 6.5–8.1)

## 2019-12-01 LAB — GLUCOSE, CAPILLARY: Glucose-Capillary: 89 mg/dL (ref 70–99)

## 2019-12-01 MED ORDER — ACETAMINOPHEN 160 MG/5ML PO SOLN
650.0000 mg | ORAL | Status: DC | PRN
  Filled 2019-12-01: qty 20.3

## 2019-12-01 MED ORDER — ACETAMINOPHEN 650 MG RE SUPP: 650.0000 mg | RECTAL | Status: DC | PRN

## 2019-12-01 MED ORDER — INSULIN ASPART 100 UNIT/ML ~~LOC~~ SOLN
0.0000 [IU] | Freq: Three times a day (TID) | SUBCUTANEOUS | Status: DC
  Administered 2019-12-02: 14:00:00 5 [IU] via SUBCUTANEOUS
  Administered 2019-12-02 – 2019-12-03 (×2): 2 [IU] via SUBCUTANEOUS
  Filled 2019-12-01 (×3): qty 1

## 2019-12-01 MED ORDER — IRBESARTAN 150 MG PO TABS
300.0000 mg | ORAL_TABLET | Freq: Every day | ORAL | Status: DC
  Administered 2019-12-02: 09:00:00 300 mg via ORAL
  Filled 2019-12-01 (×2): qty 2

## 2019-12-01 MED ORDER — CARVEDILOL 3.125 MG PO TABS
3.1250 mg | ORAL_TABLET | Freq: Two times a day (BID) | ORAL | Status: DC
  Administered 2019-12-02 – 2019-12-03 (×3): 3.125 mg via ORAL
  Filled 2019-12-01 (×3): qty 1

## 2019-12-01 MED ORDER — SODIUM CHLORIDE 0.9 % IV SOLN: INTRAVENOUS | Status: DC

## 2019-12-01 MED ORDER — ACETAMINOPHEN 325 MG PO TABS: 650.0000 mg | ORAL_TABLET | ORAL | Status: DC | PRN

## 2019-12-01 MED ORDER — ENOXAPARIN SODIUM 30 MG/0.3ML ~~LOC~~ SOLN
30.0000 mg | SUBCUTANEOUS | Status: DC
  Administered 2019-12-01 – 2019-12-02 (×2): 30 mg via SUBCUTANEOUS
  Filled 2019-12-01 (×2): qty 0.3

## 2019-12-01 MED ORDER — SENNOSIDES-DOCUSATE SODIUM 8.6-50 MG PO TABS: 1.0000 | ORAL_TABLET | Freq: Every evening | ORAL | Status: DC | PRN

## 2019-12-01 MED ORDER — ROSUVASTATIN CALCIUM 10 MG PO TABS
5.0000 mg | ORAL_TABLET | Freq: Every day | ORAL | Status: DC
  Administered 2019-12-01 – 2019-12-03 (×3): 5 mg via ORAL
  Filled 2019-12-01 (×3): qty 1

## 2019-12-01 MED ORDER — PANTOPRAZOLE SODIUM 40 MG PO TBEC
40.0000 mg | DELAYED_RELEASE_TABLET | Freq: Every day | ORAL | Status: DC
  Administered 2019-12-01 – 2019-12-03 (×3): 40 mg via ORAL
  Filled 2019-12-01 (×3): qty 1

## 2019-12-01 MED ORDER — FUROSEMIDE 40 MG PO TABS
40.0000 mg | ORAL_TABLET | Freq: Every day | ORAL | Status: DC
  Administered 2019-12-01: 23:00:00 40 mg via ORAL
  Filled 2019-12-01: qty 1

## 2019-12-01 MED ORDER — STROKE: EARLY STAGES OF RECOVERY BOOK: Freq: Once | Status: AC

## 2019-12-01 MED ORDER — LORAZEPAM 2 MG/ML IJ SOLN
1.0000 mg | Freq: Once | INTRAMUSCULAR | Status: AC
  Administered 2019-12-01: 20:00:00 1 mg via INTRAVENOUS
  Filled 2019-12-01: qty 1

## 2019-12-01 MED ORDER — INSULIN DETEMIR 100 UNIT/ML ~~LOC~~ SOLN
12.0000 [IU] | Freq: Every morning | SUBCUTANEOUS | Status: DC
  Administered 2019-12-02 – 2019-12-03 (×2): 12 [IU] via SUBCUTANEOUS
  Filled 2019-12-01 (×3): qty 0.12

## 2019-12-01 MED ORDER — ASPIRIN EC 81 MG PO TBEC
81.0000 mg | DELAYED_RELEASE_TABLET | Freq: Every day | ORAL | Status: DC
  Administered 2019-12-01 – 2019-12-03 (×3): 81 mg via ORAL
  Filled 2019-12-01 (×3): qty 1

## 2019-12-01 MED ORDER — FERROUS SULFATE 325 (65 FE) MG PO TABS
325.0000 mg | ORAL_TABLET | Freq: Three times a day (TID) | ORAL | Status: DC
  Administered 2019-12-02 – 2019-12-03 (×5): 325 mg via ORAL
  Filled 2019-12-01 (×5): qty 1

## 2019-12-01 MED ORDER — DOCUSATE SODIUM 100 MG PO CAPS: 100.0000 mg | ORAL_CAPSULE | Freq: Two times a day (BID) | ORAL | Status: DC | PRN

## 2019-12-01 MED ORDER — INSULIN ASPART 100 UNIT/ML ~~LOC~~ SOLN
0.0000 [IU] | Freq: Every day | SUBCUTANEOUS | Status: DC
  Administered 2019-12-02: 2 [IU] via SUBCUTANEOUS
  Filled 2019-12-01: qty 1

## 2019-12-01 MED ORDER — PIOGLITAZONE HCL 30 MG PO TABS
30.0000 mg | ORAL_TABLET | Freq: Every day | ORAL | Status: DC
  Filled 2019-12-01 (×2): qty 1

## 2019-12-01 MED ORDER — CLOPIDOGREL BISULFATE 75 MG PO TABS
75.0000 mg | ORAL_TABLET | Freq: Every day | ORAL | Status: DC
  Administered 2019-12-01 – 2019-12-03 (×3): 75 mg via ORAL
  Filled 2019-12-01 (×3): qty 1

## 2019-12-01 NOTE — ED Notes (Signed)
Patient transported to MRI at this time. 

## 2019-12-01 NOTE — ED Notes (Signed)
This Probation officer responded to call light going off, pt requesting warm blanket, pt provided with request

## 2019-12-01 NOTE — Progress Notes (Signed)
Anticoagulation monitoring(Lovenox):  73 yo female ordered Lovenox 40 mg Q24h  Filed Weights   12/01/19 1725  Weight: 175 lb (79.4 kg)   BMI    Lab Results  Component Value Date   CREATININE 2.15 (H) 12/01/2019   CREATININE 2.24 (H) 06/24/2019   CREATININE 1.99 (H) 05/18/2019   Estimated Creatinine Clearance: 26 mL/min (A) (by C-G formula based on SCr of 2.15 mg/dL (H)). Hemoglobin & Hematocrit     Component Value Date/Time   HGB 10.2 (L) 12/01/2019 1732   HGB 8.9 (L) 02/15/2019 1405   HCT 33.2 (L) 12/01/2019 1732   HCT 27.8 (L) 02/15/2019 1405     Per Protocol for Patient with estCrcl < 30 ml/min and BMI < 40, will transition to Lovenox 30 mg Q24h.

## 2019-12-01 NOTE — ED Triage Notes (Signed)
Pt arrived via EMS from home d/t sudden onset of aphasia and unsteady gate. Pt is A&O x 4 at this time and able to answer time and place questions. Pt is still having difficulty speaking but denies any H/A at this time. EMS states pt daughter reported pt last known well time at 1100 today. Pt has hx of stroke 2 years ago.

## 2019-12-01 NOTE — ED Notes (Signed)
Patient's daughter reports patient was last seen well at 1200 this afternoon. Reports someone from church went to her house at 1430 and noticed she was having trouble getting words out and had some confusion. Daughter also reports that patient has had these symptoms before when taking gabapentin. Patient's daughter reports that she was recently started on a similar med and thinks she may be having the same reaction. MD made aware.

## 2019-12-01 NOTE — H&P (Signed)
History and Physical   Tonya [name] XKG:[id number] DOB: Apr 28, 1946 DOA: 12/01/2019  Referring MD/NP/PA: Dr. Joan Mayans  PCP: [name], NP   Outpatient Specialists: None  Patient coming from: Home  Chief Complaint: Altered mental status with speech changes  HPI: Tonya [name] is a 73 y.o. female with medical history significant of diabetes, chronic kidney disease stage IV, anxiety disorder, hypertension, hyperlipidemia, diastolic dysfunction CHF, previous history of CVA in 2018 who was brought in by daughter due to confusion and speech changes.  Patient's husband is at bedside.  Reported that patient had sudden onset of confusion and changes in her speech.  She had difficulty finding her words.  Patient is still having dysarthria.  No new focal weakness.  Patient apparently had similar episode before where she was taking gabapentin.  She is now taking Lyrica.  So far head CT without contrast is negative and patient is being admitted for stroke work-up..  ED Course: Temperature is 97 for blood pressure 120/105 pulse 78 respirate of 19 oxygen sat 98% room air.  Sodium 134 potassium 5.1 chloride 113 CO2 21 glucose 143.  BUN 55 creatinine 2.15.  CBC showed a hemoglobin 10.2.  COVID-19 screen is negative.  Head CT without contrast is negative.  She is being admitted for further work-up.  Review of Systems: As per HPI otherwise 10 point review of systems negative.    Past Medical History:  Diagnosis Date  . (HFpEF) heart failure with preserved ejection fraction ([place])    a. 05/2017 Echo: EF 55-60%, Gr1 DD, mildly dil LA.  . CKD (chronic kidney disease), stage III   . Diabetes mellitus without complication ([place])   . Hyperlipidemia   . Hypertension   . Stroke [facility])    a. 08/2017.    Past Surgical History:  Procedure Laterality Date  . COLONOSCOPY  12/15/2006  . COLONOSCOPY WITH PROPOFOL N/A 06/06/2019   Procedure: COLONOSCOPY WITH PROPOFOL;  Surgeon: [name], MD;  Location: [facility]  ENDOSCOPY;  Service: Gastroenterology;  Laterality: N/A;  . TUBAL LIGATION       reports that she has never smoked. She has never used smokeless tobacco. She reports that she does not drink alcohol or use drugs.  Allergies  Allergen Reactions  . Atorvastatin     Myalgias   . Gabapentin Other (See Comments)    Speech impairment   . Pravastatin     Myalgias   . Trulicity [Dulaglutide] Hives    Family History  Problem Relation Age of Onset  . Diabetes Mother   . Stroke Mother   . Hyperlipidemia Father   . Hypertension Father   . Diabetes Sister   . Diabetes Brother   . Gout Son   . Diabetes Brother   . Kidney disease Son   . Lymphoma Sister   . Heart attack Sister   . Breast cancer Neg Hx      Prior to Admission medications   Medication Sig Start Date End Date Taking? Authorizing Provider  aspirin 81 MG tablet Take 81 mg by mouth daily.   Yes [provider]  carvedilol (COREG) 3.125 MG tablet Take 1 tablet (3.125 mg total) by mouth 2 (two) times daily with a meal. 03/29/19  Yes Gollan, Kathlene November, MD  clopidogrel (PLAVIX) 75 MG tablet TAKE 1 TABLET BY MOUTH  DAILY 08/09/19  Yes Cannady, [name] T, NP  DOK 100 MG capsule TAKE ONE CAPSULE BY MOUTH EVERY DAY. DO NOT TAKE IF YOU HAVE LOOSE STOOLS  07/25/19  Yes Earlie Server, MD  ferrous sulfate 325 (65 FE) MG EC tablet Take 1 tablet (325 mg total) by mouth 3 (three) times daily with meals. 09/27/19  Yes Earlie Server, MD  furosemide (LASIX) 40 MG tablet Take 1 tablet (40 mg total) by mouth daily. 12/31/18  Yes Cannady, [name] T, NP  HUMALOG KWIKPEN 100 UNIT/ML KiwkPen TK 12 UNITS BEFORE LUNCH AND 8 UNITS BEFORE SUPPER. 05/20/18  Yes [provider]  insulin detemir (LEVEMIR) 100 UNIT/ML injection Inject 12 Units into the skin every morning.    Yes [provider]  olmesartan (BENICAR) 40 MG tablet TAKE 1 TABLET(40 MG) BY MOUTH DAILY 05/18/19  Yes Cannady, [name] T, NP  omeprazole (PRILOSEC) 20 MG capsule TAKE 1 CAPSULE  BY MOUTH  TWICE DAILY 08/09/19  Yes Cannady, [name] T, NP  pioglitazone (ACTOS) 30 MG tablet Take 30 mg by mouth daily. 10/11/19  Yes [provider]  pregabalin (LYRICA) 50 MG capsule Take 1 capsule (50 mg total) by mouth at bedtime. 10/17/19  Yes Cannady, [name] T, NP  rosuvastatin (CRESTOR) 5 MG tablet Take 1 tablet (5 mg total) by mouth daily. 03/29/19  Yes [name], MD    Physical Exam: Vitals:   12/01/19 1725 12/01/19 1930 12/01/19 2124 12/01/19 2156  BP:  (!) 153/141 (!) 159/85 (!) 168/97  Pulse:  75 78 74  Resp:  14 15 18   Temp:    (!) 97.4 F (36.3 C)  TempSrc:    Oral  SpO2:  100% 100% 100%  Weight: 79.4 kg     Height: 5\' 7"  (1.702 m)         Constitutional: NAD, very anxious Vitals:   12/01/19 1725 12/01/19 1930 12/01/19 2124 12/01/19 2156  BP:  (!) 153/141 (!) 159/85 (!) 168/97  Pulse:  75 78 74  Resp:  14 15 18   Temp:    (!) 97.4 F (36.3 C)  TempSrc:    Oral  SpO2:  100% 100% 100%  Weight: 79.4 kg     Height: 5\' 7"  (1.702 m)      Eyes: PERRL, lids and conjunctivae normal ENMT: Mucous membranes are moist. Posterior pharynx clear of any exudate or lesions.Normal dentition.  Neck: normal, supple, no masses, no thyromegaly Respiratory: clear to auscultation bilaterally, no wheezing, no crackles. Normal respiratory effort. No accessory muscle use.  Cardiovascular: Regular rate and rhythm, no murmurs / rubs / gallops. No extremity edema. 2+ pedal pulses. No carotid bruits.  Abdomen: no tenderness, no masses palpated. No hepatosplenomegaly. Bowel sounds positive.  Musculoskeletal: no clubbing / cyanosis. No joint deformity upper and lower extremities. Good ROM, no contractures. Normal muscle tone.  Skin: no rashes, lesions, ulcers. No induration Neurologic: CN 2-12 grossly intact. Sensation intact, DTR normal. Strength 5/5 in all 4.  Gross expression aphasia Psychiatric: Normal judgment and insight. Alert and oriented x 3. Normal mood.     Labs on  Admission: I have personally reviewed following labs and imaging studies  CBC: Recent Labs  Lab 12/01/19 1732  WBC 8.6  NEUTROABS 7.1  HGB 10.2*  HCT 33.2*  MCV 87.6  PLT 287   Basic Metabolic Panel: Recent Labs  Lab 12/01/19 1732  NA 144  K 5.1  CL 113*  CO2 21*  GLUCOSE 143*  BUN 55*  CREATININE 2.15*  CALCIUM 9.1   GFR: Estimated Creatinine Clearance: 26 mL/min (A) (by C-G formula based on SCr of 2.15 mg/dL (H)). Liver Function Tests: Recent Labs  Lab 12/01/19 1732  AST 25  ALT 17  ALKPHOS 85  BILITOT 0.6  PROT 6.9  ALBUMIN 3.5   No results for input(s): LIPASE, AMYLASE in the last 168 hours. No results for input(s): AMMONIA in the last 168 hours. Coagulation Profile: No results for input(s): INR, PROTIME in the last 168 hours. Cardiac Enzymes: No results for input(s): CKTOTAL, CKMB, CKMBINDEX, TROPONINI in the last 168 hours. BNP (last 3 results) No results for input(s): PROBNP in the last 8760 hours. HbA1C: No results for input(s): HGBA1C in the last 72 hours. CBG: Recent Labs  Lab 12/01/19 2217  GLUCAP 89   Lipid Profile: No results for input(s): CHOL, HDL, LDLCALC, TRIG, CHOLHDL, LDLDIRECT in the last 72 hours. Thyroid Function Tests: No results for input(s): TSH, T4TOTAL, FREET4, T3FREE, THYROIDAB in the last 72 hours. Anemia Panel: No results for input(s): VITAMINB12, FOLATE, FERRITIN, TIBC, IRON, RETICCTPCT in the last 72 hours. Urine analysis:    Component Value Date/Time   COLORURINE YELLOW 10/31/2012 1320   APPEARANCEUR CLEAR 10/31/2012 1320   LABSPEC 1.015 10/31/2012 1320   PHURINE 5.0 10/31/2012 1320   GLUCOSEU >/= 2000 mg/dL 10/31/2012 1320   HGBUR NEGATIVE 10/31/2012 1320   BILIRUBINUR NEGATIVE 10/31/2012 1320   KETONESUR NEGATIVE 10/31/2012 1320   PROTEINUR TRACE 10/31/2012 1320   NITRITE NEGATIVE 10/31/2012 1320   LEUKOCYTESUR NEGATIVE 10/31/2012 1320   Sepsis Labs: @LABRCNTIP (procalcitonin:4,lacticidven:4) )No results  found for this or any previous visit (from the past 240 hour(s)).   Radiological Exams on Admission: CT Head Wo Contrast  Result Date: 12/01/2019 CLINICAL DATA:  Word finding difficulties, sudden onset aphasia an unsteady gait EXAM: CT HEAD WITHOUT CONTRAST TECHNIQUE: Contiguous axial images were obtained from the base of the skull through the vertex without intravenous contrast. COMPARISON:  CT head 03/10/2018 FINDINGS: Brain: No evidence of acute infarction, hemorrhage, hydrocephalus, extra-axial collection or mass lesion/mass effect. Symmetric prominence of the ventricles, cisterns and sulci compatible with parenchymal volume loss. Patchy areas of white matter hypoattenuation are most compatible with chronic microvascular angiopathy. Stable mineralization in the left basal ganglia is likely senescent. Vascular: Atherosclerotic calcification of the carotid siphons and intradural vertebral arteries. No hyperdense vessel. Skull: No calvarial fracture or suspicious osseous lesion. No scalp swelling or hematoma. Sinuses/Orbits: Paranasal sinuses and mastoid air cells are predominantly clear. Included orbital structures are unremarkable. Other: None IMPRESSION: 1. No acute intracranial abnormality. If persisting clinical concern for infarction, MRI is more sensitive and specific for findings of ischemia. 2. Stable parenchymal volume loss and chronic microvascular angiopathy. Electronically Signed   By: Lovena Le M.D.   On: 12/01/2019 18:08   MR Brain Wo Contrast (neuro protocol)  Result Date: 12/01/2019 CLINICAL DATA:  Speech disturbance.  Gait disturbance. EXAM: MRI HEAD WITHOUT CONTRAST TECHNIQUE: Multiplanar, multiecho pulse sequences of the brain and surrounding structures were obtained without intravenous contrast. COMPARISON:  Head CT same day FINDINGS: Brain: Diffusion imaging does not show any acute or subacute infarction. Brainstem is normal. Incidental venous angioma in the right cerebellum  without hemorrhage. Cerebral hemispheres show moderate atrophy and chronic small-vessel ischemic change of the white matter, often seen at this age. No cortical or large vessel territory infarction. No mass, hemorrhage, hydrocephalus or extra-axial collection. Vascular: Major vessels at the base of the brain show flow. Skull and upper cervical spine: Negative Sinuses/Orbits: Clear/normal Other: Bilateral mastoid effusions. IMPRESSION: No acute finding. Moderate age related volume loss and chronic small-vessel change of the cerebral hemispheric white matter. Small bilateral mastoid  effusions. Electronically Signed   By: Nelson Chimes M.D.   On: 12/01/2019 21:10    EKG: Independently reviewed.  It showed normal sinus rhythm with no significant ST changes.  Assessment/Plan Principal Problem:   Acute cerebrovascular accident (CVA) ([place]) Active Problems:   Hyperlipidemia associated with type 2 diabetes mellitus (HCC)   Chronic kidney disease, stage 4, severely decreased GFR (HCC)   Anxiety and depression   Uncontrolled type 2 diabetes mellitus with hyperglycemia, with long-term current use of insulin ([place])     #1 dysarthria and possible CVA: Patient will be admitted for CVA work-up.  Get MRI of the brain.  Carotid Dopplers echocardiogram to be done.  If MRI is positive will proceed with stroke work-up.  She is already on high intensity statin was Plavix.  Continue with PT and OT.  Pregabalin.  We are holding that for now.  #2 diabetes: Continue with sliding scale insulin and home regimen.  #3 hypertension: Continue home regimen and monitor.  #4 hyperlipidemia: Continue with statin.  #5 chronic kidney disease stage IV: Appears to be at baseline.   DVT prophylaxis: Lovenox Code Status: Full code Family Communication: Husband at bedside Disposition Plan: Home Consults called: None Admission status: Inpatient  Severity of Illness: The appropriate patient status for this patient is INPATIENT.  Inpatient status is judged to be reasonable and necessary in order to provide the required intensity of service to ensure the patient's safety. The patient's presenting symptoms, physical exam findings, and initial radiographic and laboratory data in the context of their chronic comorbidities is felt to place them at high risk for further clinical deterioration. Furthermore, it is not anticipated that the patient will be medically stable for discharge from the hospital within 2 midnights of admission. The following factors support the patient status of inpatient.   " The patient's presenting symptoms include speech changes. " The worrisome physical exam findings include dysarthria. " The initial radiographic and laboratory data are worrisome because of no acute CVA. " The chronic co-morbidities include diabetes with hypertension.   * I certify that at the point of admission it is my clinical judgment that the patient will require inpatient hospital care spanning beyond 2 midnights from the point of admission due to high intensity of service, high risk for further deterioration and high frequency of surveillance required.Barbette Merino MD [facility] Pager [phone]  If 7PM-7AM, please contact night-coverage [email or web address] Password [email or web address]  12/01/2019, 11:52 PM

## 2019-12-01 NOTE — ED Provider Notes (Signed)
[facility] Emergency Department Provider Note  ____________________________________________   First MD Initiated Contact with Patient 12/01/19 1718     (approximate)  I have reviewed the triage vital signs and the nursing notes.  History  Chief Complaint Aphasia    HPI Tonya [name] is a 73 y.o. female with history of CVA, CKD, DM, HTN, HLD who presents emergency department for word finding difficulties.  Patient last seen normal at 11 AM.  She was seen later in the afternoon by her daughters, who noted she had word finding difficulties.  She is slow to speak, having difficulty finding the words that she wants, and is mildly stuttering.  EMS also noticed an unsteady gait.  No lateralizing weakness.  No facial droop.  No visual changes, no headache. Patient has a history of similar symptoms when on gabapentin.  Of note, she was recently started on Lyrica.   Past Medical Hx Past Medical History:  Diagnosis Date  . (HFpEF) heart failure with preserved ejection fraction ([place])    a. 05/2017 Echo: EF 55-60%, Gr1 DD, mildly dil LA.  . CKD (chronic kidney disease), stage III   . Diabetes mellitus without complication ([place])   . Hyperlipidemia   . Hypertension   . Stroke [facility])    a. 08/2017.    Problem List Patient Active Problem List   Diagnosis Date Noted  . Uncontrolled type 2 diabetes mellitus with hyperglycemia, with long-term current use of insulin ([place]) 03/15/2019  . Non compliance w medication regimen 03/15/2019  . Iron deficiency anemia 02/22/2019  . Low back pain 05/12/2018  . Rotator cuff disorder 01/15/2018  . 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 Chronic pain of left knee 09/21/2017  . Advanced care planning/counseling discussion 04/13/2017  . Post herpetic neuralgia 10/10/2016  . Hip bursitis 09/18/2015  . Anxiety and depression 08/21/2015  . Poorly controlled type 2 diabetes mellitus  with neuropathy ([place]) 05/01/2015  . Hyperlipidemia associated with type 2 diabetes mellitus ([place]) 05/01/2015  . Chronic kidney disease, stage 4, severely decreased GFR ([place]) 05/01/2015  . Encounter for long-term (current) use of insulin ([place]) 05/01/2015    Past Surgical Hx Past Surgical History:  Procedure Laterality Date  . COLONOSCOPY  12/15/2006  . COLONOSCOPY WITH PROPOFOL N/A 06/06/2019   Procedure: COLONOSCOPY WITH PROPOFOL;  Surgeon: [name], MD;  Location: [facility] ENDOSCOPY;  Service: Gastroenterology;  Laterality: N/A;  . TUBAL LIGATION      Medications Prior to Admission medications   Medication Sig Start Date End Date Taking? Authorizing Provider  aspirin 81 MG tablet Take 81 mg by mouth daily.    [provider]  carvedilol (COREG) 3.125 MG tablet Take 1 tablet (3.125 mg total) by mouth 2 (two) times daily with a meal. 03/29/19   Gollan, Kathlene November, MD  clopidogrel (PLAVIX) 75 MG tablet TAKE 1 TABLET BY MOUTH  DAILY 08/09/19   Cannady, [name] T, NP  DOK 100 MG capsule TAKE ONE CAPSULE BY MOUTH EVERY DAY. DO NOT TAKE IF YOU HAVE LOOSE STOOLS 07/25/19   Earlie Server, MD  ferrous sulfate 325 (65 FE) MG EC tablet Take 1 tablet (325 mg total) by mouth 3 (three) times daily with meals. 09/27/19   Earlie Server, MD  furosemide (LASIX) 40 MG tablet Take 1 tablet (40 mg total) by mouth daily. 12/31/18   Cannady, [name] T, NP  HUMALOG KWIKPEN 100 UNIT/ML KiwkPen TK 12 UNITS BEFORE LUNCH  AND 8 UNITS BEFORE SUPPER. 05/20/18   [provider]  insulin detemir (LEVEMIR) 100 UNIT/ML injection Inject 12 Units into the skin every morning.     [provider]  NOVOFINE 32G X 6 MM MISC USE AS DIRECTED THREE TIMES DAILY WITH HUMALOG 06/02/19   Cannady, [name] T, NP  olmesartan (BENICAR) 40 MG tablet TAKE 1 TABLET(40 MG) BY MOUTH DAILY 05/18/19   Cannady, [name] T, NP  omeprazole (PRILOSEC) 20 MG capsule TAKE 1 CAPSULE BY MOUTH  TWICE DAILY 08/09/19   Cannady, Henrine Screws T, NP  ONE TOUCH ULTRA  TEST test strip USE TO TEST BLOOD SUGAR THREE TIMES DAILY 07/21/17   Kathrine Haddock, NP  pioglitazone (ACTOS) 30 MG tablet Take 30 mg by mouth daily. 10/11/19   [provider]  pregabalin (LYRICA) 50 MG capsule Take 1 capsule (50 mg total) by mouth at bedtime. 10/17/19   Cannady, Henrine Screws T, NP  rosuvastatin (CRESTOR) 5 MG tablet Take 1 tablet (5 mg total) by mouth daily. 03/29/19   [name], MD    Allergies Atorvastatin, Gabapentin, Pravastatin, and Trulicity [dulaglutide]  Family Hx Family History  Problem Relation Age of Onset  . Diabetes Mother   . Stroke Mother   . Hyperlipidemia Father   . Hypertension Father   . Diabetes Sister   . Diabetes Brother   . Gout Son   . Diabetes Brother   . Kidney disease Son   . Lymphoma Sister   . Heart attack Sister   . Breast cancer Neg Hx     Social Hx Social History   Tobacco Use  . Smoking status: Never Smoker  . Smokeless tobacco: Never Used  Substance Use Topics  . Alcohol use: No    Alcohol/week: 0.0 standard drinks  . Drug use: No     Review of Systems  Constitutional: Negative for fever, chills. Eyes: Negative for visual changes. ENT: Negative for sore throat. Cardiovascular: Negative for chest pain. Respiratory: Negative for shortness of breath. Gastrointestinal: Negative for nausea, vomiting.  Genitourinary: Negative for dysuria. Musculoskeletal: Negative for leg swelling. Skin: Negative for rash. Neurological: Negative for for headaches. + word finding difficulties   Physical Exam  Vital Signs: ED Triage Vitals  Enc Vitals Group     BP 12/01/19 1723 (!) 121/105     Pulse Rate 12/01/19 1723 64     Resp 12/01/19 1723 19     Temp --      Temp Source 12/01/19 1723 Oral     SpO2 12/01/19 1723 98 %     Weight 12/01/19 1725 175 lb (79.4 kg)     Height 12/01/19 1725 5\' 7"  (1.702 m)     Head Circumference --      Peak Flow --      Pain Score 12/01/19 1725 0     Pain Loc --      Pain Edu? --       Excl. in [place]? --     Constitutional: Alert and oriented.  Head: Normocephalic. Atraumatic. Eyes: Conjunctivae clear. Sclera anicteric. Nose: No congestion. No rhinorrhea. Mouth/Throat: Wearing mask.  Neck: No stridor.   Cardiovascular: Normal rate, regular rhythm. Extremities well perfused. Respiratory: Normal respiratory effort.  Lungs CTAB. Gastrointestinal: Soft. Non-tender. Non-distended.  Musculoskeletal: No lower extremity edema. No deformities. Neurologic:  Word finding difficulties - able to eventually identify what she wants to say, but takes great amount of time and effort. stuttering over words. Able to name object. Alert and oriented.  Face symmetric.  Tongue midline.  Cranial nerves II through XII intact. UE and LE strength 5/5 and symmetric. UE and LE SILT.  Skin: Skin is warm, dry and intact. No rash noted. Psychiatric: Mood and affect are appropriate for situation.  EKG  Personally reviewed.   Rate: 65 Rhythm: sinus Axis: normal Intervals: WNL Non specific T wave/ST findings in III, aVF No STEMI    Radiology  CT head:  IMPRESSION:  1. No acute intracranial abnormality. If persisting clinical concern  for infarction, MRI is more sensitive and specific for findings of  ischemia.  2. Stable parenchymal volume loss and chronic microvascular  angiopathy.    Procedures  Procedure(s) performed (including critical care):  Procedures   Initial Impression / Assessment and Plan / ED Course  73 y.o. female who presents to the ED for word finding difficulties, as above  Ddx: CVA (code stroke note activated due to LKN > 4.5 hours), TIA, medication reaction (suspect Lyrica, given similar reaction when on gabapentin), infection/electrolyte abnormality  Will obtain labs, imaging, reassess  Labs without actionable derangements, creatinine near her baseline.  CT imaging is negative.  Daughter confirms that she was recently started on Lyrica.  She had a similar  reaction when she was on gabapentin, therefore the Lyrica could be the etiology of her symptoms.  However given her risk factors and prior stroke, will plan to admit for stroke rule out, especially since her symptoms have not yet resolved. MRI ordered.  Will discuss with hospitalist for admission.   Final Clinical Impression(s) / ED Diagnosis  Final diagnoses:  Word finding difficulty       Note:  This document was prepared using Dragon voice recognition software and may include unintentional dictation errors.   Lilia Pro., MD 12/02/19 [phone]

## 2019-12-01 NOTE — ED Notes (Signed)
Report given to Silvia, RN

## 2019-12-02 ENCOUNTER — Ambulatory Visit: Payer: Self-pay | Admitting: Pharmacist

## 2019-12-02 ENCOUNTER — Telehealth: Payer: Self-pay

## 2019-12-02 ENCOUNTER — Inpatient Hospital Stay: Payer: Medicare Other

## 2019-12-02 ENCOUNTER — Inpatient Hospital Stay (HOSPITAL_COMMUNITY)
Admit: 2019-12-02 | Discharge: 2019-12-02 | Disposition: A | Discharge status: Home / Self Care | Payer: Medicare Other | Attending: Internal Medicine | Admitting: Internal Medicine

## 2019-12-02 DIAGNOSIS — I361 Nonrheumatic tricuspid (valve) insufficiency: Secondary | ICD-10-CM

## 2019-12-02 LAB — LIPID PANEL
Cholesterol: 159 mg/dL (ref 0–200)
HDL: 64 mg/dL (ref >40)
LDL Cholesterol: 80 mg/dL (ref 0–99)
Total CHOL/HDL Ratio: 2.5 RATIO
Triglycerides: 75 mg/dL (ref <150)
VLDL: 15 mg/dL (ref 0–40)

## 2019-12-02 LAB — GLUCOSE, CAPILLARY
Glucose-Capillary: 165 mg/dL — ABNORMAL HIGH (ref 70–99)
Glucose-Capillary: 260 mg/dL — ABNORMAL HIGH (ref 70–99)
Glucose-Capillary: 77 mg/dL (ref 70–99)
Glucose-Capillary: 218 mg/dL — ABNORMAL HIGH (ref 70–99)

## 2019-12-02 LAB — COMPREHENSIVE METABOLIC PANEL
ALT: 14 U/L (ref 0–44)
AST: 21 U/L (ref 15–41)
Albumin: 3.1 g/dL — ABNORMAL LOW (ref 3.5–5.0)
Alkaline Phosphatase: 73 U/L (ref 38–126)
Anion gap: 9 (ref 5–15)
BUN: 50 mg/dL — ABNORMAL HIGH (ref 8–23)
CO2: 21 mmol/L — ABNORMAL LOW (ref 22–32)
Calcium: 8.8 mg/dL — ABNORMAL LOW (ref 8.9–10.3)
Chloride: 114 mmol/L — ABNORMAL HIGH (ref 98–111)
Creatinine, Ser: 1.93 mg/dL — ABNORMAL HIGH (ref 0.44–1.00)
GFR calc Af Amer: 30 mL/min — ABNORMAL LOW (ref >60)
GFR calc non Af Amer: 26 mL/min — ABNORMAL LOW (ref >60)
Glucose, Bld: 176 mg/dL — ABNORMAL HIGH (ref 70–99)
Potassium: 5.1 mmol/L (ref 3.5–5.1)
Sodium: 144 mmol/L (ref 135–145)
Total Bilirubin: 0.6 mg/dL (ref 0.3–1.2)
Total Protein: 5.8 g/dL — ABNORMAL LOW (ref 6.5–8.1)

## 2019-12-02 LAB — CBC
HCT: 29 % — ABNORMAL LOW (ref 36.0–46.0)
Hemoglobin: 9.1 g/dL — ABNORMAL LOW (ref 12.0–15.0)
MCH: 27.2 pg (ref 26.0–34.0)
MCHC: 31.4 g/dL (ref 30.0–36.0)
MCV: 86.6 fL (ref 80.0–100.0)
Platelets: 213 10*3/uL (ref 150–400)
RBC: 3.35 MIL/uL — ABNORMAL LOW (ref 3.87–5.11)
RDW: 15.1 % (ref 11.5–15.5)
WBC: 6 10*3/uL (ref 4.0–10.5)
nRBC: 0 % (ref 0.0–0.2)

## 2019-12-02 LAB — URINALYSIS, COMPLETE (UACMP) WITH MICROSCOPIC
Bilirubin Urine: NEGATIVE
Glucose, UA: NEGATIVE mg/dL
Hgb urine dipstick: NEGATIVE
Ketones, ur: NEGATIVE mg/dL
Leukocytes,Ua: NEGATIVE
Nitrite: NEGATIVE
Protein, ur: 30 mg/dL — AB
Specific Gravity, Urine: 1.011 (ref 1.005–1.030)
pH: 5 (ref 5.0–8.0)

## 2019-12-02 LAB — SARS CORONAVIRUS 2 (TAT 6-24 HRS): SARS Coronavirus 2: NEGATIVE

## 2019-12-02 LAB — HEMOGLOBIN A1C
Hgb A1c MFr Bld: 9.8 % — ABNORMAL HIGH (ref 4.8–5.6)
Mean Plasma Glucose: 234.56 mg/dL

## 2019-12-02 LAB — ECHOCARDIOGRAM COMPLETE
Height: 67 in
Weight: 2800 oz

## 2019-12-02 NOTE — Progress Notes (Signed)
OT Cancellation Note  Patient Details Name: Tonya [name] MRN: [id number] DOB: [birth year]   Cancelled Treatment:    Reason Eval/Treat Not Completed: Patient at procedure or test/ unavailable. Consult received, chart reviewed. Pt receiving an echo upon attempt. Will re-attempt OT evaluation at later date/time as pt is available.   Jeni Salles, MPH, MS, OTR/L ascom [phone] 12/02/19, 11:11 AM

## 2019-12-02 NOTE — Progress Notes (Signed)
*  PRELIMINARY RESULTS* Echocardiogram 2D Echocardiogram has been performed.  Wallie Char Jerime Arif 12/02/2019, 11:23 AM

## 2019-12-02 NOTE — Chronic Care Management (AMB) (Signed)
  Chronic Care Management   Note  12/02/2019 Name: Tonya [name] MRN: [id number] DOB: [birth year]  Tonya [name] is a 73 y.o. year old female who is a primary care patient of Cannady, [name], NP. The CCM team was consulted for assistance with chronic disease management and care coordination needs.    Scheduled to call patient today for follow up, however, admitted yesterday for change in speech patterns. Will follow; have scheduled outreach call in ~2 weeks to follow up on any medication changes.   [name], PharmD, [place] [phone]

## 2019-12-02 NOTE — Progress Notes (Signed)
[facility] Progress Note  Patient: Tonya [name] JKD:[id number]   PCP: [name], NP DOB: 12-30-1945   DOA: 12/01/2019   DOS: 12/02/2019   Date of Service: the patient was seen and examined on 12/02/2019  Chief Complaint  Patient presents with  . Aphasia   Brief hospital course: Tonya [name] is a 73 y.o. female with medical history significant of diabetes, chronic kidney disease stage IV, anxiety disorder, hypertension, hyperlipidemia, diastolic dysfunction CHF, previous history of CVA in 2018 who was brought in by daughter due to confusion and speech changes.  Patient's husband is at bedside.  Reported that patient had sudden onset of confusion and changes in her speech.  She had difficulty finding her words.  Patient is still having dysarthria.  No new focal weakness.  Patient apparently had similar episode before where she was taking gabapentin.  She is now taking Lyrica.  So far head CT without contrast is negative and patient is being admitted for stroke work-up.  Currently further plan is monitor for improvement in symptoms as well as blood pressure improvement.  Subjective: Continues to have a stutter as well as confusion.  No nausea or vomiting.  Patient and the family still feels that she is not her baseline.  No fever no chills.  Assessment and Plan: Scheduled Meds: . aspirin EC  81 mg Oral Daily  . carvedilol  3.125 mg Oral BID WC  . clopidogrel  75 mg Oral Daily  . enoxaparin (LOVENOX) injection  30 mg Subcutaneous Q24H  . ferrous sulfate  325 mg Oral TID WC  . insulin aspart  0-5 Units Subcutaneous QHS  . insulin aspart  0-9 Units Subcutaneous TID WC  . insulin detemir  12 Units Subcutaneous q morning - 10a  . pantoprazole  40 mg Oral Daily  . rosuvastatin  5 mg Oral Daily   Continuous Infusions: PRN Meds: acetaminophen **OR** acetaminophen (TYLENOL) oral liquid 160 mg/5 mL **OR** acetaminophen, docusate sodium, senna-docusate  1  dysarthria: Polypharmacy CT of the head as well as MRI brain negative for any acute stroke. Mild worsening of renal function. Suspect this is secondary to polypharmacy. Echocardiogram shows preserved EF. PT OT evaluated the patient recommends SNF. Speech therapy evaluation pending. Carotid Doppler negative for any significant stenosis. Holding Lyrica. Continue Plavix.  2 type 2 diabetes mellitus, controlled. Continue with sliding scale insulin and home regimen.  3 hypertension:  Continue home regimen and monitor.  4 hyperlipidemia:  Continue with statin.  5 chronic kidney disease stage IV: Appears to be at baseline.  Diet: Cardiac diet  DVT Prophylaxis: Subcutaneous Lovenox   Advance goals of care discussion: Full code  Family Communication: no family was present at bedside, at the time of interview.  Disposition:  Discharge to SNF versus home with home health PT will reevaluate the patient tomorrow.  Consultants: none Procedures: Echocardiogram  Antibiotics: Anti-infectives (From admission, onward)   None       Objective: Physical Exam: Vitals:   12/02/19 0617 12/02/19 0900 12/02/19 1300 12/02/19 1733  BP: 132/76  126/72 (!) 149/83  Pulse: 87  86 90  Resp: 17  18   Temp: 98.2 F (36.8 C) 97.9 F (36.6 C)  98.5 F (36.9 C)  TempSrc:  Oral  Oral  SpO2: 99% 100% 100% 100%  Weight:      Height:        Intake/Output Summary (Last 24 hours) at 12/02/2019 1815 Last data filed at 12/02/2019 0416 Gross per 24  hour  Intake 357.51 ml  Output --  Net 357.51 ml   Filed Weights   12/01/19 1725  Weight: 79.4 kg   General: alert and oriented to time, place, and person. Appear in moderate distress, affect anxious Eyes: PERRL, Conjunctiva normal ENT: Oral Mucosa Clear, moist  Neck: no JVD, no Abnormal Mass Or lumps Cardiovascular: S1 and S2 Present, no Murmur,  Respiratory: good respiratory effort, Bilateral Air entry equal and Decreased, no signs of  accessory muscle use, Clear to Auscultation, no Crackles, no wheezes Abdomen: Bowel Sound present, Soft and no tenderness, no hernia Skin: no rashes  Extremities: no Pedal edema, no calf tenderness Neurologic: mental status, alert and oriented x3, PERLA, Sensation grossly normal to light touch, Reflex difficult to assess, Finger to nose test normal bilaterally  and Combination of expressive aphasia and motor dysarthria Gait not checked due to patient safety concerns  Data Reviewed: I have personally reviewed and interpreted daily labs, tele strips, imagings as discussed above. I reviewed all nursing notes, pharmacy notes, vitals, pertinent old records I have discussed plan of care as described above with RN and patient/family.  CBC: Recent Labs  Lab 12/01/19 1732 12/02/19 0912  WBC 8.6 6.0  NEUTROABS 7.1  --   HGB 10.2* 9.1*  HCT 33.2* 29.0*  MCV 87.6 86.6  PLT 232 010   Basic Metabolic Panel: Recent Labs  Lab 12/01/19 1732 12/02/19 0912  NA 144 144  K 5.1 5.1  CL 113* 114*  CO2 21* 21*  GLUCOSE 143* 176*  BUN 55* 50*  CREATININE 2.15* 1.93*  CALCIUM 9.1 8.8*    Liver Function Tests: Recent Labs  Lab 12/01/19 1732 12/02/19 0912  AST 25 21  ALT 17 14  ALKPHOS 85 73  BILITOT 0.6 0.6  PROT 6.9 5.8*  ALBUMIN 3.5 3.1*   No results for input(s): LIPASE, AMYLASE in the last 168 hours. No results for input(s): AMMONIA in the last 168 hours. Coagulation Profile: No results for input(s): INR, PROTIME in the last 168 hours. Cardiac Enzymes: No results for input(s): CKTOTAL, CKMB, CKMBINDEX, TROPONINI in the last 168 hours. BNP (last 3 results) No results for input(s): PROBNP in the last 8760 hours. CBG: Recent Labs  Lab 12/01/19 2217 12/02/19 0807 12/02/19 1131 12/02/19 1659  GLUCAP 89 165* 260* 77   Studies: MR Brain Wo Contrast (neuro protocol)  Result Date: 12/01/2019 CLINICAL DATA:  Speech disturbance.  Gait disturbance. EXAM: MRI HEAD WITHOUT CONTRAST  TECHNIQUE: Multiplanar, multiecho pulse sequences of the brain and surrounding structures were obtained without intravenous contrast. COMPARISON:  Head CT same day FINDINGS: Brain: Diffusion imaging does not show any acute or subacute infarction. Brainstem is normal. Incidental venous angioma in the right cerebellum without hemorrhage. Cerebral hemispheres show moderate atrophy and chronic small-vessel ischemic change of the white matter, often seen at this age. No cortical or large vessel territory infarction. No mass, hemorrhage, hydrocephalus or extra-axial collection. Vascular: Major vessels at the base of the brain show flow. Skull and upper cervical spine: Negative Sinuses/Orbits: Clear/normal Other: Bilateral mastoid effusions. IMPRESSION: No acute finding. Moderate age related volume loss and chronic small-vessel change of the cerebral hemispheric white matter. Small bilateral mastoid effusions. Electronically Signed   By: Nelson Chimes M.D.   On: 12/01/2019 21:10   US Carotid Bilateral (at [facility] and [facility] only)  Result Date: 12/02/2019 CLINICAL DATA:  Acute CVA. History of hypertension, hyperlipidemia and diabetes. EXAM: BILATERAL CAROTID DUPLEX ULTRASOUND TECHNIQUE: Pearline Cables scale imaging, color Doppler and  duplex ultrasound were performed of bilateral carotid and vertebral arteries in the neck. COMPARISON:  None. FINDINGS: Criteria: Quantification of carotid stenosis is based on velocity parameters that correlate the residual internal carotid diameter with NASCET-based stenosis levels, using the diameter of the distal internal carotid lumen as the denominator for stenosis measurement. The following velocity measurements were obtained: RIGHT ICA: 102/38 cm/sec CCA: 32/95 cm/sec SYSTOLIC ICA/CCA RATIO:  1.5 ECA: 61 cm/sec LEFT ICA: 88/35 cm/sec CCA: 18/84 cm/sec SYSTOLIC ICA/CCA RATIO:  1.3 ECA: 93 cm/sec RIGHT CAROTID ARTERY: There is a minimal amount of eccentric echogenic plaque involving the proximal aspect  of the right internal carotid artery (image 22), not resulting in elevated peak systolic velocities within the interrogated course of the right internal carotid artery to suggest a hemodynamically significant stenosis. RIGHT VERTEBRAL ARTERY:  Antegrade Flow LEFT CAROTID ARTERY: There is a minimal amount of eccentric mixed echogenic plaque within the left carotid bulb (image 47), extending to involve the origin and proximal aspects of the left internal carotid artery (image 54), not resulting in elevated peak systolic velocities within the interrogated course of the left internal carotid artery to suggest a hemodynamically significant stenosis. LEFT VERTEBRAL ARTERY:  Antegrade flow IMPRESSION: Minimal amount of bilateral atherosclerotic plaque, not resulting in a hemodynamically significant stenosis within either internal carotid artery. Electronically Signed   By: Sandi Mariscal M.D.   On: 12/02/2019 09:03   ECHOCARDIOGRAM COMPLETE  Result Date: 12/02/2019   ECHOCARDIOGRAM REPORT   Patient Name:   NEMIAH BUBAR Date of Exam: 12/02/2019 Medical Rec #:  [id number]       Height: Accession #:    [id number]      Weight: Date of Birth:  [birth year]       BSA: Patient Age:    72 years        BP:           158/79 mmHg Patient Gender: F               HR:           68 bpm. Exam Location:  [facility] Procedure: 2D Echo, Color Doppler and Cardiac Doppler Indications:     I163.9 Stroke  History:         Patient has prior history of Echocardiogram examinations.                  HFeEF, CKD; Risk Factors:Hypertension, Diabetes and                  Dyslipidemia.  Sonographer:     Charmayne Sheer RDCS (AE) Referring Phys:  [place] Diagnosing Phys: Ida Rogue MD  Sonographer Comments: Suboptimal apical window and no subcostal window. IMPRESSIONS  1. Left ventricular ejection fraction, by visual estimation, is 60 to 65%. The left ventricle has normal function. There is moderately increased left ventricular hypertrophy.  2.  Left ventricular diastolic parameters are consistent with Grade I diastolic dysfunction (impaired relaxation).  3. The left ventricle has no regional wall motion abnormalities.  4. Global right ventricle has normal systolic function.The right ventricular size is normal. No increase in right ventricular wall thickness.  5. Left atrial size was normal.  6. TR signal is inadequate for assessing pulmonary artery systolic pressure. FINDINGS  Left Ventricle: Left ventricular ejection fraction, by visual estimation, is 60 to 65%. The left ventricle has normal function. The left ventricle has no regional wall motion abnormalities. There is moderately increased left ventricular hypertrophy. Left  ventricular diastolic parameters are consistent with Grade I diastolic dysfunction (impaired relaxation). Normal left atrial pressure. Right Ventricle: The right ventricular size is normal. No increase in right ventricular wall thickness. Global RV systolic function is has normal systolic function. Left Atrium: Left atrial size was normal in size. Right Atrium: Right atrial size was normal in size Pericardium: There is no evidence of pericardial effusion. Mitral Valve: The mitral valve is normal in structure. No evidence of mitral valve regurgitation. No evidence of mitral valve stenosis by observation. Tricuspid Valve: The tricuspid valve is normal in structure. Tricuspid valve regurgitation is mild. Aortic Valve: The aortic valve is tricuspid. Aortic valve regurgitation is not visualized. The aortic valve is structurally normal, with no evidence of sclerosis or stenosis. Pulmonic Valve: The pulmonic valve was normal in structure. Pulmonic valve regurgitation is trivial. Pulmonic regurgitation is trivial. Aorta: The aortic root, ascending aorta and aortic arch are all structurally normal, with no evidence of dilitation or obstruction. Venous: The inferior vena cava is normal in size with greater than 50% respiratory variability,  suggesting right atrial pressure of 3 mmHg. IAS/Shunts: No atrial level shunt detected by color flow Doppler. There is no evidence of a patent foramen ovale. No ventricular septal defect is seen or detected. There is no evidence of an atrial septal defect.  Ida Rogue MD Electronically signed by Ida Rogue MD Signature Date/Time: 12/02/2019/12:34:57 PM    Final      Time spent: 35 minutes  Author: Berle Mull, MD [facility] 12/02/2019 6:15 PM  To reach On-call, see care teams to locate the attending and reach out to them via [email or web address]. If 7PM-7AM, please contact night-coverage If you still have difficulty reaching the attending provider, please page the [facility] (Director on Call) for [facility] on amion for assistance.

## 2019-12-02 NOTE — TOC Initial Note (Signed)
Transition of Care [facility]) - Initial/Assessment Note    Patient Details  Name: Tonya [name] MRN: [id number] Date of Birth: 08/22/1946  [facility] CM/SW Contact:    Shelbie Hutching, RN Phone Number: 12/02/2019, 4:16 PM  Clinical Narrative:                 Patient is from home where she lives with her husband.  Patient admitted for stroke.  PT has recommended SNF but patient refuses and says she wants to go home.  Patient walks with a cane at home, at discharge she will need a rolling walker.  Patient will also need home health services.  Floydene Flock with [facility] given referral- he has not accepted referral yet but will get back with [facility] team if they can accept the patient.   RNCM will cont to follow for any additional discharge needs.   Expected Discharge Plan: [place] Barriers to Discharge: Continued Medical Work up   Patient Goals and CMS Choice Patient states their goals for this hospitalization and ongoing recovery are:: to get better and get out of the hospital CMS Medicare.gov Compare Post Acute Care list provided to:: Patient Choice offered to / list presented to : Patient  Expected Discharge Plan and Services Expected Discharge Plan: [place]   Discharge Planning Services: CM Consult Post Acute Care Choice: [place] arrangements for the past 2 months: Single Family Home                 DME Arranged: Walker rolling DME Agency: [employer]       HH Arranged: PT, OT [place] Agency: [place] ([place]) Date HH Agency Contacted: 12/02/19 Time [place]: 2440 Representative spoke with at [place]: Floydene Flock given referral- has not accepted referral yet  Prior Living Arrangements/Services Living arrangements for the past 2 months: Single Family Home Lives with:: Spouse Patient language and need for interpreter reviewed:: Yes Do you feel safe going back to the place where you live?:  Yes      Need for Family Participation in Patient Care: Yes (Comment)(stroke) Care giver support system in place?: Yes (comment)(husband)   Criminal Activity/Legal Involvement Pertinent to Current Situation/Hospitalization: No - Comment as needed  Activities of Daily Living Home Assistive Devices/Equipment: None ADL Screening (condition at time of admission) Patient's cognitive ability adequate to safely complete daily activities?: Yes Is the patient deaf or have difficulty hearing?: No Does the patient have difficulty seeing, even when wearing glasses/contacts?: Yes Does the patient have difficulty concentrating, remembering, or making decisions?: No Patient able to express need for assistance with ADLs?: Yes Does the patient have difficulty dressing or bathing?: No Independently performs ADLs?: Yes (appropriate for developmental age) Does the patient have difficulty walking or climbing stairs?: Yes Weakness of Legs: Both Weakness of Arms/Hands: None  Permission Sought/Granted Permission sought to share information with : Case Manager, Customer service manager Permission granted to share information with : Yes, Verbal Permission Granted     Permission granted to share info w AGENCY: [facility]        Emotional Assessment Appearance:: Appears stated age Attitude/Demeanor/Rapport: Engaged Affect (typically observed): Accepting Orientation: : Oriented to Self, Oriented to Place, Oriented to  Time, Oriented to Situation Alcohol / Substance Use: Not Applicable Psych Involvement: No (comment)  Admission diagnosis:  Word finding difficulty [R47.89] Acute cerebrovascular accident (CVA) [facility]) [I63.9] Patient Active Problem List   Diagnosis Date  Noted  . Acute cerebrovascular accident (CVA) ([place]) 12/01/2019  . Uncontrolled type 2 diabetes mellitus with hyperglycemia, with long-term current use of insulin ([place]) 03/15/2019  . Non compliance w medication regimen  03/15/2019  . Iron deficiency anemia 02/22/2019  . Low back pain 05/12/2018  . Rotator cuff disorder 01/15/2018  . 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 Chronic pain of left knee 09/21/2017  . Advanced care planning/counseling discussion 04/13/2017  . Post herpetic neuralgia 10/10/2016  . Hip bursitis 09/18/2015  . Anxiety and depression 08/21/2015  . Poorly controlled type 2 diabetes mellitus with neuropathy ([place]) 05/01/2015  . Hyperlipidemia associated with type 2 diabetes mellitus ([place]) 05/01/2015  . Chronic kidney disease, stage 4, severely decreased GFR ([place]) 05/01/2015  . Encounter for long-term (current) use of insulin ([place]) 05/01/2015   PCP:  [name], NP Pharmacy:   [facility] [phone] - [address] [place] [address] Phone: [phone] Fax: [phone]  [place], [address] [place] [address] Phone: [phone] Fax: [phone]     Social Determinants of Health ([place]) Interventions    Readmission Risk Interventions No flowsheet data found.

## 2019-12-02 NOTE — Evaluation (Signed)
Physical Therapy Evaluation Patient Details Name: Tonya [name] MRN: [id number] DOB: Aug 08, 1946 Today's Date: 12/02/2019   History of Present Illness  Pt is a 73 y.o. female presenting to hospital 12/01/19 with confusion and speech changes.  Pt admitted with dysarthria and stroke work-up.  CT of head and MRI of brain negative for acute intracranial abnormality.  PMH includes DM, CKD stage IV, anxiety disorder, htn, diastolic dysfunction CHF, h/o CVA 2018.  Clinical Impression  Prior to hospital admission, pt was independent (except used cane outside of home for ambulation) and lives with her husband.  Nurse cleared pt for participation in physical therapy (nurse reports pt positive for orthostatics earlier this morning but pt was asymptomatic; pt reporting being asymptomatic during physical therapy session).  Currently pt is modified independent semi-supine to sitting edge of bed; min assist to stand (no AD) but requiring max assist for balance d/t posterior lean when standing from bed and then upon getting to recliner.  Pt did better with transfers using walker and then able to ambulate 10 feet with walker in room with min assist to steady occasionally.  Limited distance ambulating d/t fatigue and SOB (HR WFL and O2 sats 94% or greater on room air during session's activities).  Pt would benefit from skilled PT to address noted impairments and functional limitations (see below for any additional details).  Upon hospital discharge, pt currently would benefit from STR but with continued improvement with medical condition and mobility, pt may improve to benefiting from HHPT services instead (CM notified).    Follow Up Recommendations SNF(pending progress)    Equipment Recommendations  Rolling walker with 5" wheels;3in1 (PT)    Recommendations for Other Services OT consult     Precautions / Restrictions Precautions Precautions: Fall Restrictions Weight Bearing Restrictions: No       Mobility  Bed Mobility Overal bed mobility: Modified Independent             General bed mobility comments: Semi-supine to sit with mild increased effort/time to perform on own  Transfers Overall transfer level: Needs assistance Equipment used: None;Rolling walker (2 wheeled) Transfers: Sit to/from [facility] Sit to Stand: Min assist;Min guard;Max assist Stand pivot transfers: Min assist       General transfer comment: pt requiring min assist to stand from bed and then max assist for initial standing balance d/t significant posterior lean requiring assist to correct; pt then able to perform stand step turn (with R hand hold assist) bed to recliner but pt with significant posterior lean after getting to recliner requiring max assist to prevent fall (pt assisted with sitting down safely in recliner).  Ambulation/Gait Ambulation/Gait assistance: Min assist Gait Distance (Feet): 10 Feet Assistive device: Rolling walker (2 wheeled)   Gait velocity: decreased   General Gait Details: occasional min assist to steady with ambulating using walker; limited distance d/t SOB and fatigue; partial step through gait pattern  Stairs            Wheelchair Mobility    Modified Rankin (Stroke Patients Only)       Balance Overall balance assessment: Needs assistance Sitting-balance support: No upper extremity supported;Feet supported Sitting balance-Leahy Scale: Normal Sitting balance - Comments: steady sitting reaching outside BOS   Standing balance support: No upper extremity supported Standing balance-Leahy Scale: Poor Standing balance comment: pt with significant posterior lean 2x's in standing requiring max assist to prevent falls  Pertinent Vitals/Pain Pain Assessment: No/denies pain except pt reporting brief chronic pain L knee with L knee extension (LAQ's)    Home Living Family/patient expects to be discharged  to:: Private residence Living Arrangements: Spouse/significant other Available Help at Discharge: Family Type of Home: House Home Access: Stairs to enter Entrance Stairs-Rails: Right Entrance Stairs-Number of Steps: 2 Home Layout: One level Home Equipment: Cane - single point;Grab bars - tub/shower      Prior Function Level of Independence: Independent with assistive device(s)         Comments: No AD use in home but uses SPC outside of home.  Per pt, no falls in past 6 months.     Hand Dominance        Extremity/Trunk Assessment   Upper Extremity Assessment Upper Extremity Assessment: Generalized weakness    Lower Extremity Assessment Lower Extremity Assessment: Generalized weakness(intact B LE light touch, proprioception, and tone; at least 3/5  B hip flexion, knee flexion/extension, and DF/PF)       Communication   Communication: (Occasional difficulty with words)  Cognition Arousal/Alertness: Awake/alert Behavior During Therapy: WFL for tasks assessed/performed Overall Cognitive Status: Within Functional Limits for tasks assessed                                        General Comments   Nursing cleared pt for participation in physical therapy.  Pt agreeable to PT session.    Exercises General Exercises - Lower Extremity Long Arc Quad: AROM;Strengthening;Both;10 reps;Seated Hip Flexion/Marching: AROM;Strengthening;Both;10 reps;Seated  Transfer and gait training with walker   Assessment/Plan    PT Assessment Patient needs continued PT services  PT Problem List Decreased strength;Decreased activity tolerance;Decreased balance;Decreased mobility;Decreased knowledge of use of DME;Decreased knowledge of precautions       PT Treatment Interventions DME instruction;Gait training;Stair training;Functional mobility training;Therapeutic activities;Therapeutic exercise;Balance training;Patient/family education    PT Goals (Current goals can be found  in the Care Plan section)  Acute Rehab PT Goals Patient Stated Goal: to go home PT Goal Formulation: With patient Time For Goal Achievement: 12/16/19 Potential to Achieve Goals: Good    Frequency Min 2X/week   Barriers to discharge        Co-evaluation               AM-PAC PT "6 Clicks" Mobility  Outcome Measure Help needed turning from your back to your side while in a flat bed without using bedrails?: A Little Help needed moving from lying on your back to sitting on the side of a flat bed without using bedrails?: A Little Help needed moving to and from a bed to a chair (including a wheelchair)?: A Lot Help needed standing up from a chair using your arms (e.g., wheelchair or bedside chair)?: A Lot Help needed to walk in hospital room?: A Little Help needed climbing 3-5 steps with a railing? : A Lot 6 Click Score: 15    End of Session Equipment Utilized During Treatment: Gait belt Activity Tolerance: Patient limited by fatigue Patient left: in chair;with call bell/phone within reach;with chair alarm set Nurse Communication: Mobility status;Precautions PT Visit Diagnosis: Unsteadiness on feet (R26.81);Other abnormalities of gait and mobility (R26.89);Muscle weakness (generalized) (M62.81);Difficulty in walking, not elsewhere classified (R26.2)    Time: 6789-3810 PT Time Calculation (min) (ACUTE ONLY): 26 min   Charges:   PT Evaluation $PT Eval Low Complexity: 1 Low PT Treatments $Therapeutic  Activity: 8-22 mins        Leitha Bleak, PT 12/02/19, 11:18 AM

## 2019-12-02 NOTE — Plan of Care (Signed)
  Problem: Education: Goal: Knowledge of secondary prevention will improve Outcome: Progressing Goal: Knowledge of patient specific risk factors addressed and post discharge goals established will improve Outcome: Progressing   Problem: Coping: Goal: Will verbalize positive feelings about self Outcome: Progressing Goal: Will identify appropriate support needs Outcome: Progressing   Problem: Health Behavior/Discharge Planning: Goal: Ability to manage health-related needs will improve Outcome: Progressing   Problem: Self-Care: Goal: Ability to participate in self-care as condition permits will improve Outcome: Progressing Goal: Verbalization of feelings and concerns over difficulty with self-care will improve Outcome: Progressing Goal: Ability to communicate needs accurately will improve Outcome: Progressing   Problem: Nutrition: Goal: Risk of aspiration will decrease Outcome: Progressing   Problem: Ischemic Stroke/TIA Tissue Perfusion: Goal: Complications of ischemic stroke/TIA will be minimized Outcome: Progressing   Problem: Health Behavior/Discharge Planning: Goal: Ability to manage health-related needs will improve Outcome: Progressing   Problem: Clinical Measurements: Goal: Ability to maintain clinical measurements within normal limits will improve Outcome: Progressing Goal: Will remain free from infection Outcome: Progressing Goal: Diagnostic test results will improve Outcome: Progressing Goal: Respiratory complications will improve Outcome: Progressing   Problem: Activity: Goal: Risk for activity intolerance will decrease Outcome: Progressing   Problem: Nutrition: Goal: Adequate nutrition will be maintained Outcome: Progressing   Problem: Coping: Goal: Level of anxiety will decrease Outcome: Progressing   Problem: Elimination: Goal: Will not experience complications related to bowel motility Outcome: Progressing Goal: Will not experience  complications related to urinary retention Outcome: Progressing   Problem: Pain Managment: Goal: General experience of comfort will improve Outcome: Progressing   Problem: Safety: Goal: Ability to remain free from injury will improve Outcome: Progressing   Problem: Skin Integrity: Goal: Risk for impaired skin integrity will decrease Outcome: Progressing   Problem: Education: Goal: Knowledge of General Education information will improve Description: Including pain rating scale, medication(s)/side effects and non-pharmacologic comfort measures Outcome: Progressing   Problem: Clinical Measurements: Goal: Ability to maintain clinical measurements within normal limits will improve Outcome: Progressing   Problem: Safety: Goal: Ability to remain free from injury will improve Outcome: Progressing

## 2019-12-02 NOTE — Progress Notes (Signed)
Chart reviewed. Pt visited. MRI is negative for acute stroke. MD notes indicate speech deficits may be related to medication. Today, pt reports speech improved but not back to baseline. "It comes and goes" Pt is able to communicate needs and wants bit does have some dysfluency. Will hold on on speech/ cognitive eval at this time to see if it returns to baseline. Pt reports no dysphagia and per Nsg tolerated meds well.

## 2019-12-02 NOTE — Evaluation (Signed)
Occupational Therapy Evaluation Patient Details Name: Tonya [name] MRN: [id number] DOB: [birth year] Today's Date: 12/02/2019    History of Present Illness Pt is a 73 y.o. female presenting to hospital 12/01/19 with confusion and speech changes.  Pt admitted with dysarthria and stroke work-up.  CT of head and MRI of brain negative for acute intracranial abnormality.  PMH includes DM, CKD stage IV, anxiety disorder, htn, diastolic dysfunction CHF, h/o CVA 2018.   Clinical Impression   Pt seen for OT evaluation this date. Prior to hospital admission, pt was independent with ADL. Currently pt demonstrates impairments in balance and strength for ADL requiring CGA to Min A. Pt able to ambulate to [facility] with RW with VC for RW mgt and CGA for pericare. Pt would benefit from skilled OT to address noted impairments and functional limitations (see below for any additional details) in order to maximize safety and independence while minimizing falls risk and caregiver burden.  Upon hospital discharge, recommend pt discharge to SNF pending progress.    Follow Up Recommendations  SNF(pending progress)    Equipment Recommendations  None recommended by OT    Recommendations for Other Services       Precautions / Restrictions Precautions Precautions: Fall Restrictions Weight Bearing Restrictions: No      Mobility Bed Mobility Overal bed mobility: Modified Independent             General bed mobility comments: Semi-supine to sit with mild increased effort/time to perform on own  Transfers Overall transfer level: Needs assistance Equipment used: Rolling walker (2 wheeled) Transfers: Sit to/from Stand Sit to Stand: Min guard;Min assist Stand pivot transfers: Min assist       General transfer comment: CGA to Min A for initial standing balance, no significant balance deficit noted    Balance Overall balance assessment: Needs assistance Sitting-balance support: No upper extremity  supported;Feet supported Sitting balance-Leahy Scale: Normal Sitting balance - Comments: steady sitting reaching outside BOS   Standing balance support: Bilateral upper extremity supported Standing balance-Leahy Scale: Fair Standing balance comment: pt with significant posterior lean 2x's in standing requiring max assist to prevent falls                           ADL either performed or assessed with clinical judgement   ADL Overall ADL's : Needs assistance/impaired                         Toilet Transfer: Min IT trainer Details (indicate cue type and reason): cues for RW [employer]- Clothing Manipulation and Hygiene: Min guard;Sit to/from stand         General ADL Comments: CGA for functional ADL transfers, Min A for LB ADL     Vision Baseline Vision/History: Wears glasses Wears Glasses: At all times(pt reports new glasses (in past 2 months) haven't been working well) Patient Visual Report: No change from baseline Vision Assessment?: No apparent visual deficits     Perception     Praxis      Pertinent Vitals/Pain Pain Assessment: No/denies pain     Hand Dominance Right   Extremity/Trunk Assessment Upper Extremity Assessment Upper Extremity Assessment: Generalized weakness(intact sensation, proprioception, and coordination)   Lower Extremity Assessment Lower Extremity Assessment: Generalized weakness       Communication Communication Communication: (Occasional difficulty with words)   Cognition Arousal/Alertness: Awake/alert Behavior During Therapy: WFL for tasks assessed/performed Overall Cognitive Status: Within Functional  Limits for tasks assessed                                     General Comments       Exercises  Other Exercises Other Exercises: Pt performed toilet transfer to [facility] with RW, cues for RW mgt, and CGA. No overt LOB noted, mildly unsteady; denied dizziness   Shoulder  Instructions      Home Living Family/patient expects to be discharged to:: Private residence Living Arrangements: Spouse/significant other Available Help at Discharge: Family Type of Home: House Home Access: Stairs to enter Technical brewer of Steps: 2 Entrance Stairs-Rails: Right Home Layout: One level     Bathroom Shower/Tub: Teacher, early years/pre: Handicapped height     [place]: [place] - single point;Grab bars - tub/shower          Prior Functioning/Environment Level of Independence: Independent with assistive device(s)        Comments: No AD use in home but uses SPC outside of home.  Per pt, no falls in past 6 months. Drives. Dtr manages her medications        OT Problem List: Impaired balance (sitting and/or standing);Decreased knowledge of use of DME or AE;Decreased strength      OT Treatment/Interventions: Self-care/ADL training;Therapeutic activities;Therapeutic exercise;DME and/or AE instruction;Patient/family education;Balance training    OT Goals(Current goals can be found in the care plan section) Acute Rehab OT Goals Patient Stated Goal: to go home OT Goal Formulation: With patient Time For Goal Achievement: 12/16/19 Potential to Achieve Goals: Good ADL Goals Pt Will Perform Lower Body Dressing: with modified independence;sit to/from stand Pt Will Transfer to Toilet: with modified independence;ambulating(LRAD for amb)  OT Frequency: Min 1X/week   Barriers to D/C:            Co-evaluation              AM-PAC OT "6 Clicks" Daily Activity     Outcome Measure Help from another person eating meals?: None Help from another person taking care of personal grooming?: None Help from another person toileting, which includes using toliet, bedpan, or urinal?: A Little Help from another person bathing (including washing, rinsing, drying)?: A Little Help from another person to put on and taking off regular upper body clothing?:  None Help from another person to put on and taking off regular lower body clothing?: A Little 6 Click Score: 21   End of Session Equipment Utilized During Treatment: Gait belt;Rolling walker  Activity Tolerance: Patient tolerated treatment well Patient left: in bed;with call bell/phone within reach;with bed alarm set  OT Visit Diagnosis: Muscle weakness (generalized) (M62.81);Unsteadiness on feet (R26.81)                Time: 1411-1430 OT Time Calculation (min): 19 min Charges:  OT General Charges $OT Visit: 1 Visit OT Evaluation $OT Eval Low Complexity: 1 Low OT Treatments $Self Care/Home Management : 8-22 mins  Jeni Salles, MPH, MS, OTR/L ascom [phone] 12/02/19, 2:59 PM

## 2019-12-02 NOTE — Plan of Care (Signed)
  Problem: Education: Goal: Knowledge of secondary prevention will improve Outcome: Progressing Goal: Knowledge of patient specific risk factors addressed and post discharge goals established will improve Outcome: Progressing   Problem: Coping: Goal: Will verbalize positive feelings about self Outcome: Progressing Goal: Will identify appropriate support needs Outcome: Progressing   Problem: Health Behavior/Discharge Planning: Goal: Ability to manage health-related needs will improve Outcome: Progressing   Problem: Self-Care: Goal: Ability to participate in self-care as condition permits will improve Outcome: Progressing Goal: Verbalization of feelings and concerns over difficulty with self-care will improve Outcome: Progressing Goal: Ability to communicate needs accurately will improve Outcome: Progressing   Problem: Nutrition: Goal: Risk of aspiration will decrease Outcome: Progressing   Problem: Ischemic Stroke/TIA Tissue Perfusion: Goal: Complications of ischemic stroke/TIA will be minimized Outcome: Progressing   Problem: Health Behavior/Discharge Planning: Goal: Ability to manage health-related needs will improve Outcome: Progressing   Problem: Clinical Measurements: Goal: Ability to maintain clinical measurements within normal limits will improve Outcome: Progressing Goal: Will remain free from infection Outcome: Progressing Goal: Diagnostic test results will improve Outcome: Progressing Goal: Respiratory complications will improve Outcome: Progressing   Problem: Activity: Goal: Risk for activity intolerance will decrease Outcome: Progressing   Problem: Nutrition: Goal: Adequate nutrition will be maintained Outcome: Progressing   Problem: Coping: Goal: Level of anxiety will decrease Outcome: Progressing   Problem: Elimination: Goal: Will not experience complications related to bowel motility Outcome: Progressing Goal: Will not experience  complications related to urinary retention Outcome: Progressing   Problem: Pain Managment: Goal: General experience of comfort will improve Outcome: Progressing   Problem: Safety: Goal: Ability to remain free from injury will improve Outcome: Progressing   Problem: Skin Integrity: Goal: Risk for impaired skin integrity will decrease Outcome: Progressing

## 2019-12-03 LAB — BASIC METABOLIC PANEL
Anion gap: 10 (ref 5–15)
BUN: 56 mg/dL — ABNORMAL HIGH (ref 8–23)
CO2: 20 mmol/L — ABNORMAL LOW (ref 22–32)
Calcium: 8.9 mg/dL (ref 8.9–10.3)
Chloride: 113 mmol/L — ABNORMAL HIGH (ref 98–111)
Creatinine, Ser: 2.14 mg/dL — ABNORMAL HIGH (ref 0.44–1.00)
GFR calc Af Amer: 26 mL/min — ABNORMAL LOW (ref >60)
GFR calc non Af Amer: 23 mL/min — ABNORMAL LOW (ref >60)
Glucose, Bld: 113 mg/dL — ABNORMAL HIGH (ref 70–99)
Potassium: 4.8 mmol/L (ref 3.5–5.1)
Sodium: 143 mmol/L (ref 135–145)

## 2019-12-03 LAB — MAGNESIUM: Magnesium: 2.3 mg/dL (ref 1.7–2.4)

## 2019-12-03 LAB — GLUCOSE, CAPILLARY
Glucose-Capillary: 100 mg/dL — ABNORMAL HIGH (ref 70–99)
Glucose-Capillary: 192 mg/dL — ABNORMAL HIGH (ref 70–99)

## 2019-12-03 MED ORDER — FUROSEMIDE 20 MG PO TABS: 20.0000 mg | ORAL_TABLET | Freq: Every day | ORAL | 0 refills | Status: DC

## 2019-12-03 MED ORDER — PREGABALIN 25 MG PO CAPS: 25.0000 mg | ORAL_CAPSULE | Freq: Every day | ORAL | 0 refills | Status: DC

## 2019-12-03 NOTE — Progress Notes (Signed)
Physical Therapy Treatment Patient Details Name: Tonya [name] MRN: [id number] DOB: 1946-04-08 Today's Date: 12/03/2019    History of Present Illness Pt is a 73 y.o. female presenting to hospital 12/01/19 with confusion and speech changes.  Pt admitted with dysarthria and stroke work-up.  CT of head and MRI of brain negative for acute intracranial abnormality.  PMH includes DM, CKD stage IV, anxiety disorder, htn, diastolic dysfunction CHF, h/o CVA 2018.    PT Comments    Out of bed and was able to progress gait in hallway 120' with RW and min guard  No LOB of safety issues noted.    Pt has progressed gait to functional household distances with good safety.  She feels comfortable with mobility skills and feels she is good to return home.  Agree.  She does agree to HHPT.  Recommended +1 assist with mobility initially for safety.  Will need RW.   Follow Up Recommendations  Home health PT;Supervision for mobility/OOB     Equipment Recommendations  Rolling walker with 5" wheels;3in1 (PT)    Recommendations for Other Services       Precautions / Restrictions Precautions Precautions: Fall Restrictions Weight Bearing Restrictions: No    Mobility  Bed Mobility Overal bed mobility: Modified Independent                Transfers Overall transfer level: Needs assistance Equipment used: Rolling walker (2 wheeled) Transfers: Sit to/from Stand Sit to Stand: Min guard            Ambulation/Gait Ambulation/Gait assistance: Min guard Gait Distance (Feet): 120 Feet Assistive device: Rolling walker (2 wheeled) Gait Pattern/deviations: Step-through pattern;Decreased step length - right;Decreased step length - left Gait velocity: decreased       Stairs             Wheelchair Mobility    Modified Rankin (Stroke Patients Only)       Balance Overall balance assessment: Needs assistance Sitting-balance support: Feet supported Sitting balance-Leahy Scale:  Normal     Standing balance support: Bilateral upper extremity supported Standing balance-Leahy Scale: Fair Standing balance comment: no LOB's today                            Cognition Arousal/Alertness: Awake/alert Behavior During Therapy: WFL for tasks assessed/performed Overall Cognitive Status: Within Functional Limits for tasks assessed                                        Exercises      General Comments        Pertinent Vitals/Pain Pain Assessment: No/denies pain    Home Living                      Prior Function            PT Goals (current goals can now be found in the care plan section) Progress towards PT goals: Progressing toward goals    Frequency    Min 2X/week      PT Plan Discharge plan needs to be updated    Co-evaluation              AM-PAC PT "6 Clicks" Mobility   Outcome Measure  Help needed turning from your back to your side while in a flat bed without using bedrails?: None Help needed  moving from lying on your back to sitting on the side of a flat bed without using bedrails?: None Help needed moving to and from a bed to a chair (including a wheelchair)?: A Little Help needed standing up from a chair using your arms (e.g., wheelchair or bedside chair)?: A Little Help needed to walk in hospital room?: A Little Help needed climbing 3-5 steps with a railing? : A Little 6 Click Score: 20    End of Session Equipment Utilized During Treatment: Gait belt Activity Tolerance: Patient tolerated treatment well Patient left: in chair;with call bell/phone within reach;Other (comment) Nurse Communication: Mobility status       Time: 6720-9470 PT Time Calculation (min) (ACUTE ONLY): 10 min  Charges:  $Gait Training: 8-22 mins                     Chesley Noon, PTA 12/03/19, 8:56 AM

## 2019-12-03 NOTE — Progress Notes (Addendum)
Patient from home, ready for discharge home today with husband. Patient refused SNF. P/T rec home health PT. However, several HH agencies ([facility], [facility], [facility], [facility], [facility], [facility]) denied services. Patient offered referral to outpatient therapy and agreeable. Would like [facility] outpatient. Rolling walker delivered to room. Brad with [employer] aware. Dr. Posey Pronto to place OPT order. RN Malka updated. LCSW signing off as no further needs identified.

## 2019-12-03 NOTE — Progress Notes (Signed)
Pt has been discharged home. Discharge papers given and explained to pt.  Pt verbalized understanding.  Meds and f/u appointments reviewed. Rx sent electronically to pharmacy. Pt made aware.

## 2019-12-04 NOTE — Discharge Summary (Signed)
[facility] Discharge Summary   Patient: Tonya [name] KDT:[id number]   PCP: [name], NP DOB: [birth year]   Date of admission: 12/01/2019   Date of discharge: 12/03/2019     Discharge Diagnoses:  Principal Problem:   Acute cerebrovascular accident (CVA) ([place]) Active Problems:   Hyperlipidemia associated with type 2 diabetes mellitus ([place])   Chronic kidney disease, stage 4, severely decreased GFR (HCC)   Anxiety and depression   Uncontrolled type 2 diabetes mellitus with hyperglycemia, with long-term current use of insulin ([place])   Admitted From: home Disposition:  Home with home health and outpatient physical therapy  Recommendations for Outpatient Follow-up:  1. PCP: Follow-up with PCP in 1 week.  Establish care with neurology 2. Follow up LABS/TEST: None  Follow-up Information    [name], NP. Schedule an appointment as soon as possible for a visit in 1 week(s).   Specialty: Nurse Practitioner Contact information: [place] [address] [phone]          Diet recommendation: Cardiac diet  Activity: The patient is advised to gradually reintroduce usual activities,as tolerated  Discharge Condition: good  Code Status: Full code   History of present illness: As per the H and P dictated on admission, "Tonya [name] is a 73 y.o. female with medical history significant of diabetes, chronic kidney disease stage IV, anxiety disorder, hypertension, hyperlipidemia, diastolic dysfunction CHF, previous history of CVA in 2018 who was brought in by daughter due to confusion and speech changes.  Patient's husband is at bedside.  Reported that patient had sudden onset of confusion and changes in her speech.  She had difficulty finding her words.  Patient is still having dysarthria.  No new focal weakness.  Patient apparently had similar episode before where she was taking gabapentin.  She is now taking Lyrica.  So far head CT without contrast is  negative and patient is being admitted for stroke work-up."  Hospital Course:  Summary of her active problems in the hospital is as following. 1 dysarthria Polypharmacy CT of the head as well as MRI brain negative for any acute stroke. Mild worsening of renal function. Suspect this is secondary to polypharmacy. Echocardiogram shows preserved EF. PT OT evaluated the patient recommends SNF. Speech therapy evaluation recommended no therapy Carotid Doppler negative for any significant stenosis. Reducing the dose of Lyrica. Continue Plavix.  2 type 2 diabetes mellitus, controlled. Continue with sliding scale insulin and home regimen.  3 hypertension: Continue home regimen and monitor.  4 hyperlipidemia: Continue with statin.  5 chronic kidney disease stage IV: Appears to be at baseline.  Patient was seen by physical therapy, who recommended Home health, which was arranged. On the day of the discharge the patient's vitals were stable, and no other acute medical condition were reported by patient. the patient was felt safe to be discharge at Home with Home health.  Consultants: none Procedures: Echocardiogram   DISCHARGE MEDICATION: Allergies as of 12/03/2019      Reactions   Atorvastatin    Myalgias   Gabapentin Other (See Comments)   Speech impairment    Pravastatin    Myalgias   Trulicity [dulaglutide] Hives      Medication List    STOP taking these medications   olmesartan 40 MG tablet Commonly known as: BENICAR     TAKE these medications   aspirin 81 MG tablet Take 81 mg by mouth daily.   carvedilol 3.125 MG tablet Commonly known as:  COREG Take 1 tablet (3.125 mg total) by mouth 2 (two) times daily with a meal.   clopidogrel 75 MG tablet Commonly known as: PLAVIX TAKE 1 TABLET BY MOUTH  DAILY   DOK 100 MG capsule Generic drug: docusate sodium TAKE ONE CAPSULE BY MOUTH EVERY DAY. DO NOT TAKE IF YOU HAVE LOOSE STOOLS   ferrous sulfate 325 (65  FE) MG EC tablet Take 1 tablet (325 mg total) by mouth 3 (three) times daily with meals.   furosemide 20 MG tablet Commonly known as: Lasix Take 1 tablet (20 mg total) by mouth daily. What changed:   medication strength  how much to take   HumaLOG KwikPen 100 UNIT/ML KwikPen Generic drug: insulin lispro TK 12 UNITS BEFORE LUNCH AND 8 UNITS BEFORE SUPPER.   insulin detemir 100 UNIT/ML injection Commonly known as: LEVEMIR Inject 12 Units into the skin every morning.   omeprazole 20 MG capsule Commonly known as: PRILOSEC TAKE 1 CAPSULE BY MOUTH  TWICE DAILY   pioglitazone 30 MG tablet Commonly known as: ACTOS Take 30 mg by mouth daily.   pregabalin 25 MG capsule Commonly known as: Lyrica Take 1 capsule (25 mg total) by mouth at bedtime. What changed:   medication strength  how much to take   rosuvastatin 5 MG tablet Commonly known as: CRESTOR Take 1 tablet (5 mg total) by mouth daily.      Allergies  Allergen Reactions  . Atorvastatin     Myalgias   . Gabapentin Other (See Comments)    Speech impairment   . Pravastatin     Myalgias   . Trulicity [Dulaglutide] Hives   Discharge Instructions    Ambulatory referral to Physical Therapy   Complete by: As directed    Diet - low sodium heart healthy   Complete by: As directed    Increase activity slowly   Complete by: As directed      Discharge Exam: Filed Weights   12/01/19 1725  Weight: 79.4 kg   Vitals:   12/03/19 0458 12/03/19 0900  BP: (!) 157/75 (!) 154/79  Pulse: 84 84  Resp: 16 18  Temp: 98.5 F (36.9 C) 98 F (36.7 C)  SpO2: 100% 100%   General: Appear in no distress, no Rash; Oral Mucosa Clear, moist. no Abnormal Mass Or lumps Cardiovascular: S1 and S2 Present, no Murmur, Respiratory: normal respiratory effort, Bilateral Air entry present and Clear to Auscultation, no Crackles, no wheezes Abdomen: Bowel Sound present, Soft and no tenderness, no hernia Extremities: no Pedal edema, no  calf tenderness Neurology: alert and oriented to time, place, and person affect appropriate.  The results of significant diagnostics from this hospitalization (including imaging, microbiology, ancillary and laboratory) are listed below for reference.    Significant Diagnostic Studies: CT Head Wo Contrast  Result Date: 12/01/2019 CLINICAL DATA:  Word finding difficulties, sudden onset aphasia an unsteady gait EXAM: CT HEAD WITHOUT CONTRAST TECHNIQUE: Contiguous axial images were obtained from the base of the skull through the vertex without intravenous contrast. COMPARISON:  CT head 03/10/2018 FINDINGS: Brain: No evidence of acute infarction, hemorrhage, hydrocephalus, extra-axial collection or mass lesion/mass effect. Symmetric prominence of the ventricles, cisterns and sulci compatible with parenchymal volume loss. Patchy areas of white matter hypoattenuation are most compatible with chronic microvascular angiopathy. Stable mineralization in the left basal ganglia is likely senescent. Vascular: Atherosclerotic calcification of the carotid siphons and intradural vertebral arteries. No hyperdense vessel. Skull: No calvarial fracture or suspicious osseous lesion. No scalp swelling  or hematoma. Sinuses/Orbits: Paranasal sinuses and mastoid air cells are predominantly clear. Included orbital structures are unremarkable. Other: None IMPRESSION: 1. No acute intracranial abnormality. If persisting clinical concern for infarction, MRI is more sensitive and specific for findings of ischemia. 2. Stable parenchymal volume loss and chronic microvascular angiopathy. Electronically Signed   By: Lovena Le M.D.   On: 12/01/2019 18:08   MR Brain Wo Contrast (neuro protocol)  Result Date: 12/01/2019 CLINICAL DATA:  Speech disturbance.  Gait disturbance. EXAM: MRI HEAD WITHOUT CONTRAST TECHNIQUE: Multiplanar, multiecho pulse sequences of the brain and surrounding structures were obtained without intravenous contrast.  COMPARISON:  Head CT same day FINDINGS: Brain: Diffusion imaging does not show any acute or subacute infarction. Brainstem is normal. Incidental venous angioma in the right cerebellum without hemorrhage. Cerebral hemispheres show moderate atrophy and chronic small-vessel ischemic change of the white matter, often seen at this age. No cortical or large vessel territory infarction. No mass, hemorrhage, hydrocephalus or extra-axial collection. Vascular: Major vessels at the base of the brain show flow. Skull and upper cervical spine: Negative Sinuses/Orbits: Clear/normal Other: Bilateral mastoid effusions. IMPRESSION: No acute finding. Moderate age related volume loss and chronic small-vessel change of the cerebral hemispheric white matter. Small bilateral mastoid effusions. Electronically Signed   By: Nelson Chimes M.D.   On: 12/01/2019 21:10   US Carotid Bilateral (at [facility] and [facility] only)  Result Date: 12/02/2019 CLINICAL DATA:  Acute CVA. History of hypertension, hyperlipidemia and diabetes. EXAM: BILATERAL CAROTID DUPLEX ULTRASOUND TECHNIQUE: Pearline Cables scale imaging, color Doppler and duplex ultrasound were performed of bilateral carotid and vertebral arteries in the neck. COMPARISON:  None. FINDINGS: Criteria: Quantification of carotid stenosis is based on velocity parameters that correlate the residual internal carotid diameter with NASCET-based stenosis levels, using the diameter of the distal internal carotid lumen as the denominator for stenosis measurement. The following velocity measurements were obtained: RIGHT ICA: 102/38 cm/sec CCA: 65/03 cm/sec SYSTOLIC ICA/CCA RATIO:  1.5 ECA: 61 cm/sec LEFT ICA: 88/35 cm/sec CCA: 54/65 cm/sec SYSTOLIC ICA/CCA RATIO:  1.3 ECA: 93 cm/sec RIGHT CAROTID ARTERY: There is a minimal amount of eccentric echogenic plaque involving the proximal aspect of the right internal carotid artery (image 22), not resulting in elevated peak systolic velocities within the interrogated course of  the right internal carotid artery to suggest a hemodynamically significant stenosis. RIGHT VERTEBRAL ARTERY:  Antegrade Flow LEFT CAROTID ARTERY: There is a minimal amount of eccentric mixed echogenic plaque within the left carotid bulb (image 47), extending to involve the origin and proximal aspects of the left internal carotid artery (image 54), not resulting in elevated peak systolic velocities within the interrogated course of the left internal carotid artery to suggest a hemodynamically significant stenosis. LEFT VERTEBRAL ARTERY:  Antegrade flow IMPRESSION: Minimal amount of bilateral atherosclerotic plaque, not resulting in a hemodynamically significant stenosis within either internal carotid artery. Electronically Signed   By: Sandi Mariscal M.D.   On: 12/02/2019 09:03   ECHOCARDIOGRAM COMPLETE  Result Date: 12/02/2019   ECHOCARDIOGRAM REPORT   Patient Name:   Tonya [name] Date of Exam: 12/02/2019 Medical Rec #:  [id number]       Height: Accession #:    [id number]      Weight: Date of Birth:  26-Jun-1946       BSA: Patient Age:    73 years        BP:           158/79 mmHg Patient Gender: F  HR:           68 bpm. Exam Location:  [facility] Procedure: 2D Echo, Color Doppler and Cardiac Doppler Indications:     I163.9 Stroke  History:         Patient has prior history of Echocardiogram examinations.                  HFeEF, CKD; Risk Factors:Hypertension, Diabetes and                  Dyslipidemia.  Sonographer:     Charmayne Sheer RDCS (AE) Referring Phys:  [place] Diagnosing Phys: Ida Rogue MD  Sonographer Comments: Suboptimal apical window and no subcostal window. IMPRESSIONS  1. Left ventricular ejection fraction, by visual estimation, is 60 to 65%. The left ventricle has normal function. There is moderately increased left ventricular hypertrophy.  2. Left ventricular diastolic parameters are consistent with Grade I diastolic dysfunction (impaired relaxation).  3. The left ventricle  has no regional wall motion abnormalities.  4. Global right ventricle has normal systolic function.The right ventricular size is normal. No increase in right ventricular wall thickness.  5. Left atrial size was normal.  6. TR signal is inadequate for assessing pulmonary artery systolic pressure. FINDINGS  Left Ventricle: Left ventricular ejection fraction, by visual estimation, is 60 to 65%. The left ventricle has normal function. The left ventricle has no regional wall motion abnormalities. There is moderately increased left ventricular hypertrophy. Left ventricular diastolic parameters are consistent with Grade I diastolic dysfunction (impaired relaxation). Normal left atrial pressure. Right Ventricle: The right ventricular size is normal. No increase in right ventricular wall thickness. Global RV systolic function is has normal systolic function. Left Atrium: Left atrial size was normal in size. Right Atrium: Right atrial size was normal in size Pericardium: There is no evidence of pericardial effusion. Mitral Valve: The mitral valve is normal in structure. No evidence of mitral valve regurgitation. No evidence of mitral valve stenosis by observation. Tricuspid Valve: The tricuspid valve is normal in structure. Tricuspid valve regurgitation is mild. Aortic Valve: The aortic valve is tricuspid. Aortic valve regurgitation is not visualized. The aortic valve is structurally normal, with no evidence of sclerosis or stenosis. Pulmonic Valve: The pulmonic valve was normal in structure. Pulmonic valve regurgitation is trivial. Pulmonic regurgitation is trivial. Aorta: The aortic root, ascending aorta and aortic arch are all structurally normal, with no evidence of dilitation or obstruction. Venous: The inferior vena cava is normal in size with greater than 50% respiratory variability, suggesting right atrial pressure of 3 mmHg. IAS/Shunts: No atrial level shunt detected by color flow Doppler. There is no evidence of a  patent foramen ovale. No ventricular septal defect is seen or detected. There is no evidence of an atrial septal defect.  Ida Rogue MD Electronically signed by Ida Rogue MD Signature Date/Time: 12/02/2019/12:34:57 PM    Final     Microbiology: Recent Results (from the past 240 hour(s))  SARS CORONAVIRUS 2 (TAT 6-24 HRS) Nasopharyngeal Nasopharyngeal Swab     Status: None   Collection Time: 12/01/19  8:16 PM   Specimen: Nasopharyngeal Swab  Result Value Ref Range Status   SARS Coronavirus 2 NEGATIVE NEGATIVE Final    Comment: (NOTE) SARS-CoV-2 target nucleic acids are NOT DETECTED. The SARS-CoV-2 RNA is generally detectable in upper and lower respiratory specimens during the acute phase of infection. Negative results do not preclude SARS-CoV-2 infection, do not rule out co-infections with other pathogens, and should  not be used as the sole basis for treatment or other patient management decisions. Negative results must be combined with clinical observations, patient history, and epidemiological information. The expected result is Negative. Fact Sheet for Patients: SugarRoll.be Fact Sheet for Healthcare Providers: https://www.woods-mathews.com/ This test is not yet approved or cleared by the [place] FDA and  has been authorized for detection and/or diagnosis of SARS-CoV-2 by FDA under an Emergency Use Authorization (EUA). This EUA will remain  in effect (meaning this test can be used) for the duration of the COVID-19 declaration under Section 56 4(b)(1) of the Act, 21 U.S.C. section 360bbb-3(b)(1), unless the authorization is terminated or revoked sooner. Performed at [facility], [place] [address] [place], [place] [address]      Labs: CBC: Recent Labs  Lab 12/01/19 1732 12/02/19 0912  WBC 8.6 6.0  NEUTROABS 7.1  --   HGB 10.2* 9.1*  HCT 33.2* 29.0*  MCV 87.6 86.6  PLT 232 257   Basic Metabolic Panel: Recent  Labs  Lab 12/01/19 1732 12/02/19 0912 12/03/19 0850  NA 144 144 143  K 5.1 5.1 4.8  CL 113* 114* 113*  CO2 21* 21* 20*  GLUCOSE 143* 176* 113*  BUN 55* 50* 56*  CREATININE 2.15* 1.93* 2.14*  CALCIUM 9.1 8.8* 8.9  MG  --   --  2.3   Liver Function Tests: Recent Labs  Lab 12/01/19 1732 12/02/19 0912  AST 25 21  ALT 17 14  ALKPHOS 85 73  BILITOT 0.6 0.6  PROT 6.9 5.8*  ALBUMIN 3.5 3.1*   No results for input(s): LIPASE, AMYLASE in the last 168 hours. No results for input(s): AMMONIA in the last 168 hours. Cardiac Enzymes: No results for input(s): CKTOTAL, CKMB, CKMBINDEX, TROPONINI in the last 168 hours. BNP (last 3 results) No results for input(s): BNP in the last 8760 hours. CBG: Recent Labs  Lab 12/02/19 1131 12/02/19 1659 12/02/19 2058 12/03/19 0756 12/03/19 1135  GLUCAP 260* 77 218* 100* 192*    Time spent: 35 minutes  Signed:  Berle Mull  [facility] 12/03/2019 8:58 AM

## 2019-12-05 ENCOUNTER — Telehealth: Payer: Self-pay

## 2019-12-05 NOTE — Telephone Encounter (Signed)
I have made the 1st attempt to contact the patient or family member in charge, in order to follow up from recently being discharged from the hospital. I was unable to leave a message on voicemail but I will make another attempt at a different time.  

## 2019-12-07 DIAGNOSIS — R809 Proteinuria, unspecified: Secondary | ICD-10-CM | POA: Diagnosis not present

## 2019-12-07 DIAGNOSIS — N2581 Secondary hyperparathyroidism of renal origin: Secondary | ICD-10-CM | POA: Diagnosis not present

## 2019-12-07 DIAGNOSIS — N184 Chronic kidney disease, stage 4 (severe): Secondary | ICD-10-CM | POA: Diagnosis not present

## 2019-12-07 DIAGNOSIS — D631 Anemia in chronic kidney disease: Secondary | ICD-10-CM | POA: Diagnosis not present

## 2019-12-08 ENCOUNTER — Ambulatory Visit: Payer: Self-pay

## 2019-12-08 NOTE — Chronic Care Management (AMB) (Signed)
This encounter was created in error. Please disregard. 

## 2019-12-12 ENCOUNTER — Ambulatory Visit: Payer: Self-pay

## 2019-12-12 NOTE — Chronic Care Management (AMB) (Signed)
  Chronic Care Management   Outreach Note   Name: Tonya [name] MRN: [id number] DOB: 11-Feb-1946    Primary Care Provider: [name], NP Reason for referral : EMMI  Contacted patient in the absence of her assigned Nurse Case Manager.  An unsuccessful telephone outreach was attempted today. I attempted to contact [name] in response to a RED EMMI alert. I was unable to reach [name] after multiple attempts. Phone rang without an option to leave a voice message.     Follow Up Plan:  -Will attempt to reach [name] again on 12/12/19.    [place] [facility] [phone]

## 2019-12-12 NOTE — Chronic Care Management (AMB) (Addendum)
  Chronic Care Management   Note  12/12/2019 Name: Tonya [name] MRN: [id number] DOB: 01-Apr-1946   Contacted [name] in the absence of her assigned Care Coordinator. Received notification that [name] was hospitalized on 12/01/19 due to altered mental status and speech changes. She was discharged on 12/03/19. Staff indicated that she required assistance arranging a post hospitalization follow-up. [name] was unable to confirm that family members would be available to provide transportation for a clinic visit. She prefers a telephonic outreach if possible. Contacted clinic staff and confirmed that she is scheduled for a virtual visit on 12/15/19.  Follow up plan: Provided direct contact information. [name] was encouraged to call if she requires additional assistance during the Care Coordinators absence.   [place] [facility] [phone]

## 2019-12-13 ENCOUNTER — Telehealth: Payer: Self-pay

## 2019-12-13 ENCOUNTER — Ambulatory Visit: Payer: Self-pay | Admitting: Pharmacist

## 2019-12-13 NOTE — Chronic Care Management (AMB) (Signed)
  Chronic Care Management   Note  12/13/2019 Name: Tonya [name] MRN: [id number] DOB: [birth year]  DIA DONATE is a 73 y.o. year old female who is a primary care patient of Cannady, [name], NP. The CCM team was consulted for assistance with chronic disease management and care coordination needs.    Attempted to contact patient to follow up on recent hospitalization. Attempted both listed numbers for the patient, was unable to leave a message. Did leave a message with her emergency contact, [name] (daughter).  Follow up plan: - Patient has f/u with PCP this week. Will collaborate w/ Care Guide to schedule phone visit w/ me.  [name], PharmD, [place] [phone]

## 2019-12-14 ENCOUNTER — Telehealth: Payer: Self-pay | Admitting: Nurse Practitioner

## 2019-12-14 NOTE — Chronic Care Management (AMB) (Signed)
°  Chronic Care Management   Outreach Note  12/14/2019 Name: Tonya [name] MRN: [id number] DOB: 1946/04/21  Referred by: [name], NP Reason for referral : Chronic Care Management (Initial CCM outreach to reschedule Pharm-D appt on 12/13/2019)   First unsuccessful outreach attempt was made today to reschedule follow up appointment with care management team member.   Follow Up Plan: The care management team will reach out to the patient again over the next 7 days.   [place], [place] [address] Direct Dial: [email or web address]  Website: [email or web address]

## 2019-12-15 ENCOUNTER — Other Ambulatory Visit: Payer: Self-pay

## 2019-12-15 ENCOUNTER — Ambulatory Visit (INDEPENDENT_AMBULATORY_CARE_PROVIDER_SITE_OTHER): Payer: Medicare Other | Admitting: Nurse Practitioner

## 2019-12-15 ENCOUNTER — Encounter: Payer: Self-pay | Admitting: Nurse Practitioner

## 2019-12-15 VITALS — BP 139/93

## 2019-12-15 DIAGNOSIS — I1310 Hypertensive heart and chronic kidney disease without heart failure, with stage 1 through stage 4 chronic kidney disease, or unspecified chronic kidney disease: Secondary | ICD-10-CM | POA: Diagnosis not present

## 2019-12-15 DIAGNOSIS — B0229 Other postherpetic nervous system involvement: Secondary | ICD-10-CM

## 2019-12-15 DIAGNOSIS — I5189 Other ill-defined heart diseases: Secondary | ICD-10-CM | POA: Insufficient documentation

## 2019-12-15 DIAGNOSIS — I519 Heart disease, unspecified: Secondary | ICD-10-CM | POA: Diagnosis not present

## 2019-12-15 DIAGNOSIS — N184 Chronic kidney disease, stage 4 (severe): Secondary | ICD-10-CM

## 2019-12-15 NOTE — Progress Notes (Signed)
BP (!) 139/93   LMP  (LMP Unknown)    Subjective:    Patient ID: Tonya [name], female    DOB: 1946/04/10, 73 y.o.   MRN: [id number]  HPI: Tonya [name] is a 73 y.o. female  Chief Complaint  Patient presents with  . Hospitalization Follow-up    . This visit was completed via telephone due to the restrictions of the COVID-19 pandemic. All issues as above were discussed and addressed but no physical exam was performed. If it was felt that the patient should be evaluated in the office, they were directed there. The patient verbally consented to this visit. Patient was unable to complete an audio/visual visit due to Lack of equipment. Due to the catastrophic nature of the COVID-19 pandemic, this visit was done through audio contact only. . Location of the patient: home . Location of the provider: home . Those involved with this call:  . Provider: [name], DNP . CMA: Yvonna Alanis, CMA . Front Desk/Registration: Jill Side  . Time spent on call: 30 minutes on the phone discussing health concerns. 10 minutes total spent in review of patient's record and preparation of their chart.  . I verified patient identity using two factors (patient name and date of birth). Patient consents verbally to being seen via telemedicine visit today.   Transition of Care Hospital Follow up.  Had Lyrica increased to 50 MG nightly for postherpetic neuralgia on 09/30/2019, prior to this had been started on 25 MG with slight benefit.  Has been struggling with neuralgia for some time.  Treated in past with Gabapentin and had similar episode as presented to ER for on this occasion.  During this recent hospitalization Lyrica was decreased back to 25 MG and at this time patient reports overall feeling better with no further difficulty word finding.    "Diet recommendation: Cardiac diet  Activity: The patient is advised to gradually reintroduce usual activities,as tolerated  Discharge Condition:  good  Code Status: Full code   History of present illness: As per the H and P dictated on admission, "Tonya Matzek Smithis a 73 y.o.femalewith medical history significant ofdiabetes, chronic kidney disease stage IV, anxiety disorder, hypertension, hyperlipidemia, diastolic dysfunction CHF, previous history of CVA in 2018 who was brought in by daughter due to confusion and speech changes. Patient's husband is at bedside. Reported that patient had sudden onset of confusion and changes in her speech. She had difficulty finding her words. Patient is still having dysarthria. No new focal weakness. Patient apparently had similar episode before where she was taking gabapentin. She is now taking Lyrica. So far head CT without contrast is negative and patient is being admitted for stroke work-up."  Hospital Course:  Summary of her active problems in the hospital is as following. 1 dysarthria Polypharmacy CT of the head as well as MRI brain negative for any acute stroke. Mild worsening of renal function. Suspect this is secondary to polypharmacy. Echocardiogram shows preserved EF. PT OT evaluated the patient recommends SNF. Speech therapy evaluation recommended no therapy Carotid Doppler negative for any significant stenosis. Reducing the dose of Lyrica. Continue Plavix.  2type 2 diabetes mellitus, controlled. Continue with sliding scale insulin and home regimen.  3 hypertension: Continue home regimen and monitor.  4 hyperlipidemia: Continue with statin.  5 chronic kidney disease stage IV: Appears to be at baseline.  Patient was seen by physical therapy, who recommended Home health, which was arranged. On the day of the discharge the patient's  vitals were stable, and no other acute medical condition were reported by patient. the patient was felt safe to be discharge at Home with Home health.  Consultants: none Procedures: Echocardiogram   Admitted From:  home Disposition:  Home with home health and outpatient physical therapy  Recommendations for Outpatient Follow-up:  1. PCP: Follow-up with PCP in 1 week.  Establish care with neurology 2. Follow up LABS/TEST: None"   H[facility] Physician: Dr. Posey Pronto D/C Date: 12/03/2019  Records Requested: 12/15/2019 Records Received: 12/15/2019 Records Reviewed: 12/15/2019  Diagnoses on Discharge:  Principal Problem:   Acute cerebrovascular accident (CVA) ([place]) Active Problems:   Hyperlipidemia associated with type 2 diabetes mellitus ([place])   Chronic kidney disease, stage 4, severely decreased GFR (HCC)   Anxiety and depression   Uncontrolled type 2 diabetes mellitus with hyperglycemia, with long-term current use of insulin (HCC)  Date of interactive Contact within 48 hours of discharge:  Contact was through: phone  Date of 7 day or 14 day face-to-face visit:    within 14 days  Outpatient Encounter Medications as of 12/15/2019  Medication Sig  . aspirin 81 MG tablet Take 81 mg by mouth daily.  . carvedilol (COREG) 3.125 MG tablet Take 1 tablet (3.125 mg total) by mouth 2 (two) times daily with a meal.  . clopidogrel (PLAVIX) 75 MG tablet TAKE 1 TABLET BY MOUTH  DAILY  . DOK 100 MG capsule TAKE ONE CAPSULE BY MOUTH EVERY DAY. DO NOT TAKE IF YOU HAVE LOOSE STOOLS  . ferrous sulfate 325 (65 FE) MG EC tablet Take 1 tablet (325 mg total) by mouth 3 (three) times daily with meals.  . furosemide (LASIX) 20 MG tablet Take 1 tablet (20 mg total) by mouth daily.  [name] HUMALOG KWIKPEN 100 UNIT/ML KiwkPen TK 12 UNITS BEFORE LUNCH AND 8 UNITS BEFORE SUPPER.  [name] insulin detemir (LEVEMIR) 100 UNIT/ML injection Inject 12 Units into the skin every morning.   [name] omeprazole (PRILOSEC) 20 MG capsule TAKE 1 CAPSULE BY MOUTH  TWICE DAILY  . pioglitazone (ACTOS) 30 MG tablet Take 30 mg by mouth daily.  . pregabalin (LYRICA) 25 MG capsule Take 1 capsule (25 mg total) by mouth at bedtime.  .  rosuvastatin (CRESTOR) 5 MG tablet Take 1 tablet (5 mg total) by mouth daily.   No facility-administered encounter medications on file as of 12/15/2019.    Diagnostic Tests Reviewed/Disposition:  CBC: Last Labs       Recent Labs  Lab 12/01/19 1732 12/02/19 0912  WBC 8.6 6.0  NEUTROABS 7.1  --   HGB 10.2* 9.1*  HCT 33.2* 29.0*  MCV 87.6 86.6  PLT 232 213     Basic Metabolic Panel: Last Labs        Recent Labs  Lab 12/01/19 1732 12/02/19 0912 12/03/19 0850  NA 144 144 143  K 5.1 5.1 4.8  CL 113* 114* 113*  CO2 21* 21* 20*  GLUCOSE 143* 176* 113*  BUN 55* 50* 56*  CREATININE 2.15* 1.93* 2.14*  CALCIUM 9.1 8.8* 8.9  MG  --   --  2.3     Liver Function Tests: Last Labs       Recent Labs  Lab 12/01/19 1732 12/02/19 0912  AST 25 21  ALT 17 14  ALKPHOS 85 73  BILITOT 0.6 0.6  PROT 6.9 5.8*  ALBUMIN 3.5 3.1*     Last Labs   No results for input(s): LIPASE, AMYLASE in the last 168 hours.  Last Labs   No results for input(s): AMMONIA in the last 168 hours.   Cardiac Enzymes: Last Labs   No results for input(s): CKTOTAL, CKMB, CKMBINDEX, TROPONINI in the last 168 hours.   BNP (last 3 results) Recent Labs (within last 365 days)[] Expand by Default  No results for input(s): BNP in the last 8760 hours.   CBG: Last Labs [] Expand by Default         Recent Labs  Lab 12/02/19 1131 12/02/19 1659 12/02/19 2058 12/03/19 0756 12/03/19 1135  GLUCAP 260* 77 218* 100* 192*     CT Head Wo Contrast  Result Date: 12/01/2019 CLINICAL DATA:  Word finding difficulties, sudden onset aphasia an unsteady gait EXAM: CT HEAD WITHOUT CONTRAST TECHNIQUE: Contiguous axial images were obtained from the base of the skull through the vertex without intravenous contrast. COMPARISON:  CT head 03/10/2018 FINDINGS: Brain: No evidence of acute infarction, hemorrhage, hydrocephalus, extra-axial collection or mass lesion/mass effect. Symmetric prominence of the ventricles,  cisterns and sulci compatible with parenchymal volume loss. Patchy areas of white matter hypoattenuation are most compatible with chronic microvascular angiopathy. Stable mineralization in the left basal ganglia is likely senescent. Vascular: Atherosclerotic calcification of the carotid siphons and intradural vertebral arteries. No hyperdense vessel. Skull: No calvarial fracture or suspicious osseous lesion. No scalp swelling or hematoma. Sinuses/Orbits: Paranasal sinuses and mastoid air cells are predominantly clear. Included orbital structures are unremarkable. Other: None IMPRESSION: 1. No acute intracranial abnormality. If persisting clinical concern for infarction, MRI is more sensitive and specific for findings of ischemia. 2. Stable parenchymal volume loss and chronic microvascular angiopathy. Electronically Signed   By: Lovena Le M.D.   On: 12/01/2019 18:08   MR Brain Wo Contrast (neuro protocol)  Result Date: 12/01/2019 CLINICAL DATA:  Speech disturbance.  Gait disturbance. EXAM: MRI HEAD WITHOUT CONTRAST TECHNIQUE: Multiplanar, multiecho pulse sequences of the brain and surrounding structures were obtained without intravenous contrast. COMPARISON:  Head CT same day FINDINGS: Brain: Diffusion imaging does not show any acute or subacute infarction. Brainstem is normal. Incidental venous angioma in the right cerebellum without hemorrhage. Cerebral hemispheres show moderate atrophy and chronic small-vessel ischemic change of the white matter, often seen at this age. No cortical or large vessel territory infarction. No mass, hemorrhage, hydrocephalus or extra-axial collection. Vascular: Major vessels at the base of the brain show flow. Skull and upper cervical spine: Negative Sinuses/Orbits: Clear/normal Other: Bilateral mastoid effusions. IMPRESSION: No acute finding. Moderate age related volume loss and chronic small-vessel change of the cerebral hemispheric white matter. Small bilateral mastoid  effusions. Electronically Signed   By: Nelson Chimes M.D.   On: 12/01/2019 21:10   US Carotid Bilateral (at [facility] and [facility] only)  Result Date: 12/02/2019 CLINICAL DATA:  Acute CVA. History of hypertension, hyperlipidemia and diabetes. EXAM: BILATERAL CAROTID DUPLEX ULTRASOUND TECHNIQUE: Pearline Cables scale imaging, color Doppler and duplex ultrasound were performed of bilateral carotid and vertebral arteries in the neck. COMPARISON:  None. FINDINGS: Criteria: Quantification of carotid stenosis is based on velocity parameters that correlate the residual internal carotid diameter with NASCET-based stenosis levels, using the diameter of the distal internal carotid lumen as the denominator for stenosis measurement. The following velocity measurements were obtained: RIGHT ICA: 102/38 cm/sec CCA: 83/66 cm/sec SYSTOLIC ICA/CCA RATIO:  1.5 ECA: 61 cm/sec LEFT ICA: 88/35 cm/sec CCA: 29/47 cm/sec SYSTOLIC ICA/CCA RATIO:  1.3 ECA: 93 cm/sec RIGHT CAROTID ARTERY: There is a minimal amount of eccentric echogenic plaque involving the proximal aspect of the right  internal carotid artery (image 22), not resulting in elevated peak systolic velocities within the interrogated course of the right internal carotid artery to suggest a hemodynamically significant stenosis. RIGHT VERTEBRAL ARTERY:  Antegrade Flow LEFT CAROTID ARTERY: There is a minimal amount of eccentric mixed echogenic plaque within the left carotid bulb (image 47), extending to involve the origin and proximal aspects of the left internal carotid artery (image 54), not resulting in elevated peak systolic velocities within the interrogated course of the left internal carotid artery to suggest a hemodynamically significant stenosis. LEFT VERTEBRAL ARTERY:  Antegrade flow IMPRESSION: Minimal amount of bilateral atherosclerotic plaque, not resulting in a hemodynamically significant stenosis within either internal carotid artery. Electronically Signed   By: Sandi Mariscal M.D.   On:  12/02/2019 09:03   ECHOCARDIOGRAM COMPLETE  Result Date: 12/02/2019   ECHOCARDIOGRAM REPORT   Patient Name:   Tonya [name] Date of Exam: 12/02/2019 Medical Rec #:  [id number]       Height: Accession #:    [id number]      Weight: Date of Birth:  [birth year]       BSA: Patient Age:    89 years        BP:           158/79 mmHg Patient Gender: F               HR:           68 bpm. Exam Location:  [facility] Procedure: 2D Echo, Color Doppler and Cardiac Doppler Indications:     I163.9 Stroke  History:         Patient has prior history of Echocardiogram examinations.                  HFeEF, CKD; Risk Factors:Hypertension, Diabetes and                  Dyslipidemia.  Sonographer:     Charmayne Sheer RDCS (AE) Referring Phys:  [place] Diagnosing Phys: Ida Rogue MD  Sonographer Comments: Suboptimal apical window and no subcostal window. IMPRESSIONS  1. Left ventricular ejection fraction, by visual estimation, is 60 to 65%. The left ventricle has normal function. There is moderately increased left ventricular hypertrophy.  2. Left ventricular diastolic parameters are consistent with Grade I diastolic dysfunction (impaired relaxation).  3. The left ventricle has no regional wall motion abnormalities.  4. Global right ventricle has normal systolic function.The right ventricular size is normal. No increase in right ventricular wall thickness.  5. Left atrial size was normal.  6. TR signal is inadequate for assessing pulmonary artery systolic pressure. FINDINGS  Left Ventricle: Left ventricular ejection fraction, by visual estimation, is 60 to 65%. The left ventricle has normal function. The left ventricle has no regional wall motion abnormalities. There is moderately increased left ventricular hypertrophy. Left ventricular diastolic parameters are consistent with Grade I diastolic dysfunction (impaired relaxation). Normal left atrial pressure. Right Ventricle: The right ventricular size is normal. No increase in  right ventricular wall thickness. Global RV systolic function is has normal systolic function. Left Atrium: Left atrial size was normal in size. Right Atrium: Right atrial size was normal in size Pericardium: There is no evidence of pericardial effusion. Mitral Valve: The mitral valve is normal in structure. No evidence of mitral valve regurgitation. No evidence of mitral valve stenosis by observation. Tricuspid Valve: The tricuspid valve is normal in structure. Tricuspid valve regurgitation is mild. Aortic Valve: The aortic valve  is tricuspid. Aortic valve regurgitation is not visualized. The aortic valve is structurally normal, with no evidence of sclerosis or stenosis. Pulmonic Valve: The pulmonic valve was normal in structure. Pulmonic valve regurgitation is trivial. Pulmonic regurgitation is trivial. Aorta: The aortic root, ascending aorta and aortic arch are all structurally normal, with no evidence of dilitation or obstruction. Venous: The inferior vena cava is normal in size with greater than 50% respiratory variability, suggesting right atrial pressure of 3 mmHg. IAS/Shunts: No atrial level shunt detected by color flow Doppler. There is no evidence of a patent foramen ovale. No ventricular septal defect is seen or detected. There is no evidence of an atrial septal defect.  Ida Rogue MD Electronically signed by Ida Rogue MD Signature Date/Time: 12/02/2019/12:34:57 PM    Final      Consults: none  Discharge Instructions: follow-up with PCP and establish care with neurology  Disease/illness Education: reviewed with patient today  Home Health/Community Services Discussions/Referrals: none, patient refuses  Establishment or re-establishment of referral orders for community resources: none  Discussion with other health care providers: reviewed notes  Assessment and Support of treatment regimen adherence: reviewed with patient  Appointments Coordinated with: reviewed with  patient  Education for self-management, independent living, and ADLs: reviewed with patient  HYPERTENSION / HYPERLIPIDEMIA Followed by cardiology, Dr. [name] Situ. Last saw him in January 2020.  Satisfied with current treatment?yes Duration of hypertension:chronic BP monitoring frequency:a few times a week BP range:130-140/80's on average  BP medication side effects:no Duration of hyperlipidemia:chronic Cholesterol medication side effects:no Cholesterol supplements: none Medication compliance:good compliance Aspirin:yes Recent stressors:no Recurrent headaches:no Visual changes:no Palpitations:no Dyspnea:no Chest pain:no Lower extremity edema: yes, at baseline Dizzy/lightheaded:no  CHRONIC KIDNEY DISEASE Followed by nephrology and last seen 08/31/2019 . CKD status: stable Medications renally dose: yes Previous renal evaluation: yes Pneumovax:  Up to Date Influenza Vaccine:  Up to Date   POSTHERPETIC NEURALGIA: Started on Pregabalin 25 MG in June and this was increased to 50 MG in October, has now been decreased back to 25 MG due to altered status recently.  Had shingles years ago and since that time has had ongoing issues with pain to lower right back. She is intolerant to Gabapentin.  Has history of imaging to lumbar spine in 2016, where her pain is, and it showed "diffuse multilevel degenerative changes lumbar spine".  Reports feeling better at this time on 25 MG dosing.  Relevant past medical, surgical, family and social history reviewed and updated as indicated. Interim medical history since our last visit reviewed. Allergies and medications reviewed and updated.  Review of Systems  Constitutional: Negative for activity change, appetite change, diaphoresis, fatigue and fever.  Respiratory: Negative for cough, chest tightness and shortness of breath.   Cardiovascular: Positive for leg swelling (baseline). Negative for chest pain and palpitations.   Gastrointestinal: Negative.   Endocrine: Negative.   Neurological: Negative for dizziness, syncope, weakness, light-headedness, numbness and headaches.  Psychiatric/Behavioral: Negative.     Per HPI unless specifically indicated above     Objective:    BP (!) 139/93   LMP  (LMP Unknown)   Wt Readings from Last 3 Encounters:  12/01/19 175 lb (79.4 kg)  09/27/19 173 lb 9.6 oz (78.7 kg)  07/06/19 174 lb (78.9 kg)    Physical Exam   Unable to perform due to telephone visit only.  Results for orders placed or performed during the hospital encounter of 12/01/19  SARS CORONAVIRUS 2 (TAT 6-24 HRS) Nasopharyngeal Nasopharyngeal Swab  Specimen: Nasopharyngeal Swab  Result Value Ref Range   SARS Coronavirus 2 NEGATIVE NEGATIVE  Urinalysis, Complete w Microscopic  Result Value Ref Range   Color, Urine STRAW (A) YELLOW   APPearance CLEAR (A) CLEAR   Specific Gravity, Urine 1.011 1.005 - 1.030   pH 5.0 5.0 - 8.0   Glucose, UA NEGATIVE NEGATIVE mg/dL   Hgb urine dipstick NEGATIVE NEGATIVE   Bilirubin Urine NEGATIVE NEGATIVE   Ketones, ur NEGATIVE NEGATIVE mg/dL   Protein, ur 30 (A) NEGATIVE mg/dL   Nitrite NEGATIVE NEGATIVE   Leukocytes,Ua NEGATIVE NEGATIVE   RBC / HPF 0-5 0 - 5 RBC/hpf   WBC, UA 0-5 0 - 5 WBC/hpf   Bacteria, UA RARE (A) NONE SEEN   Squamous Epithelial / LPF 0-5 0 - 5   Mucus PRESENT    Hyaline Casts, UA PRESENT   Comprehensive metabolic panel  Result Value Ref Range   Sodium 144 135 - 145 mmol/L   Potassium 5.1 3.5 - 5.1 mmol/L   Chloride 113 (H) 98 - 111 mmol/L   CO2 21 (L) 22 - 32 mmol/L   Glucose, Bld 143 (H) 70 - 99 mg/dL   BUN 55 (H) 8 - 23 mg/dL   Creatinine, Ser 2.15 (H) 0.44 - 1.00 mg/dL   Calcium 9.1 8.9 - 10.3 mg/dL   Total Protein 6.9 6.5 - 8.1 g/dL   Albumin 3.5 3.5 - 5.0 g/dL   AST 25 15 - 41 U/L   ALT 17 0 - 44 U/L   Alkaline Phosphatase 85 38 - 126 U/L   Total Bilirubin 0.6 0.3 - 1.2 mg/dL   GFR calc non Af Amer 22 (L) >60 mL/min    GFR calc Af Amer 26 (L) >60 mL/min   Anion gap 10 5 - 15  CBC with Differential  Result Value Ref Range   WBC 8.6 4.0 - 10.5 K/uL   RBC 3.79 (L) 3.87 - 5.11 MIL/uL   Hemoglobin 10.2 (L) 12.0 - 15.0 g/dL   HCT 33.2 (L) 36.0 - 46.0 %   MCV 87.6 80.0 - 100.0 fL   MCH 26.9 26.0 - 34.0 pg   MCHC 30.7 30.0 - 36.0 g/dL   RDW 15.2 11.5 - 15.5 %   Platelets 232 150 - 400 K/uL   nRBC 0.0 0.0 - 0.2 %   Neutrophils Relative % 84 %   Neutro Abs 7.1 1.7 - 7.7 K/uL   Lymphocytes Relative 12 %   Lymphs Abs 1.1 0.7 - 4.0 K/uL   Monocytes Relative 3 %   Monocytes Absolute 0.3 0.1 - 1.0 K/uL   Eosinophils Relative 1 %   Eosinophils Absolute 0.0 0.0 - 0.5 K/uL   Basophils Relative 0 %   Basophils Absolute 0.0 0.0 - 0.1 K/uL   Immature Granulocytes 0 %   Abs Immature Granulocytes 0.03 0.00 - 0.07 K/uL  Lipid panel  Result Value Ref Range   Cholesterol 159 0 - 200 mg/dL   Triglycerides 75 <150 mg/dL   HDL 64 >40 mg/dL   Total CHOL/HDL Ratio 2.5 RATIO   VLDL 15 0 - 40 mg/dL   LDL Cholesterol 80 0 - 99 mg/dL  CBC  Result Value Ref Range   WBC 6.0 4.0 - 10.5 K/uL   RBC 3.35 (L) 3.87 - 5.11 MIL/uL   Hemoglobin 9.1 (L) 12.0 - 15.0 g/dL   HCT 29.0 (L) 36.0 - 46.0 %   MCV 86.6 80.0 - 100.0 fL   MCH 27.2 26.0 -  34.0 pg   MCHC 31.4 30.0 - 36.0 g/dL   RDW 15.1 11.5 - 15.5 %   Platelets 213 150 - 400 K/uL   nRBC 0.0 0.0 - 0.2 %  Comprehensive metabolic panel  Result Value Ref Range   Sodium 144 135 - 145 mmol/L   Potassium 5.1 3.5 - 5.1 mmol/L   Chloride 114 (H) 98 - 111 mmol/L   CO2 21 (L) 22 - 32 mmol/L   Glucose, Bld 176 (H) 70 - 99 mg/dL   BUN 50 (H) 8 - 23 mg/dL   Creatinine, Ser 1.93 (H) 0.44 - 1.00 mg/dL   Calcium 8.8 (L) 8.9 - 10.3 mg/dL   Total Protein 5.8 (L) 6.5 - 8.1 g/dL   Albumin 3.1 (L) 3.5 - 5.0 g/dL   AST 21 15 - 41 U/L   ALT 14 0 - 44 U/L   Alkaline Phosphatase 73 38 - 126 U/L   Total Bilirubin 0.6 0.3 - 1.2 mg/dL   GFR calc non Af Amer 26 (L) >60 mL/min   GFR calc Af  Amer 30 (L) >60 mL/min   Anion gap 9 5 - 15  Glucose, capillary  Result Value Ref Range   Glucose-Capillary 89 70 - 99 mg/dL  Hemoglobin A1c  Result Value Ref Range   Hgb A1c MFr Bld 9.8 (H) 4.8 - 5.6 %   Mean Plasma Glucose 234.56 mg/dL  Glucose, capillary  Result Value Ref Range   Glucose-Capillary 165 (H) 70 - 99 mg/dL  Glucose, capillary  Result Value Ref Range   Glucose-Capillary 260 (H) 70 - 99 mg/dL  Glucose, capillary  Result Value Ref Range   Glucose-Capillary 77 70 - 99 mg/dL  Glucose, capillary  Result Value Ref Range   Glucose-Capillary 218 (H) 70 - 99 mg/dL  Glucose, capillary  Result Value Ref Range   Glucose-Capillary 100 (H) 70 - 99 mg/dL  Basic metabolic panel  Result Value Ref Range   Sodium 143 135 - 145 mmol/L   Potassium 4.8 3.5 - 5.1 mmol/L   Chloride 113 (H) 98 - 111 mmol/L   CO2 20 (L) 22 - 32 mmol/L   Glucose, Bld 113 (H) 70 - 99 mg/dL   BUN 56 (H) 8 - 23 mg/dL   Creatinine, Ser 2.14 (H) 0.44 - 1.00 mg/dL   Calcium 8.9 8.9 - 10.3 mg/dL   GFR calc non Af Amer 23 (L) >60 mL/min   GFR calc Af Amer 26 (L) >60 mL/min   Anion gap 10 5 - 15  Magnesium  Result Value Ref Range   Magnesium 2.3 1.7 - 2.4 mg/dL  Glucose, capillary  Result Value Ref Range   Glucose-Capillary 192 (H) 70 - 99 mg/dL  ECHOCARDIOGRAM COMPLETE  Result Value Ref Range   Weight 2,800 oz   Height 67 in   BP 132/76 mmHg      Assessment & Plan:   Problem List Items Addressed This Visit      Cardiovascular and Mediastinum   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  Reports she is taking medication as ordered.  Continue current medication regimen.  CMP next visit.  Continue to collaborate with nephrology and cardiology.        Nervous and Auditory   Post herpetic neuralgia    Chronic, ongoing.  Will maintain Lyrica at 25 MG due to recent altered state with trial of increase.  Monitor kidney function  and  adjust dose as needed. Difficult pain control case, if ongoing issues would consider referral to pain management.  Goal is to avoid polypharmacy.  Will consider neurology referral next visit, patient wishes to discuss further.  Refuses home OT/PT.        Genitourinary   Chronic kidney disease, stage 4, severely decreased GFR (HCC)    Chronic, ongoing.  Continue current medication regimen and collaboration with nephrology.  CMP next visit.        Other   Grade I diastolic dysfunction    Noted on echo, continue collaboration with cardiology.            I discussed the assessment and treatment plan with the patient. The patient was provided an opportunity to ask questions and all were answered. The patient agreed with the plan and demonstrated an understanding of the instructions.   The patient was advised to call back or seek an in-person evaluation if the symptoms worsen or if the condition fails to improve as anticipated.   I provided 30 minutes of time during this encounter.  Follow up plan: Return in about 3 weeks (around 01/05/2020) for T2DM, HTN/HLD, HF, neuralgia, CKD.

## 2019-12-15 NOTE — Patient Instructions (Signed)
Chronic Kidney Disease, Adult Chronic kidney disease (CKD) happens when the kidneys are damaged over a long period of time. The kidneys are two organs that help with:  Getting rid of waste and extra fluid from the blood.  Making hormones that maintain the amount of fluid in your tissues and blood vessels.  Making sure that the body has the right amount of fluids and chemicals. Most of the time, CKD does not go away, but it can usually be controlled. Steps must be taken to slow down the kidney damage or to stop it from getting worse. If this is not done, the kidneys may stop working. Follow these instructions at home: Medicines  Take over-the-counter and prescription medicines only as told by your doctor. You may need to change the amount of medicines you take.  Do not take any new medicines unless your doctor says it is okay. Many medicines can make your kidney damage worse.  Do not take any vitamin and supplements unless your doctor says it is okay. Many vitamins and supplements can make your kidney damage worse. General instructions  Follow a diet as told by your doctor. You may need to stay away from: ? Alcohol. ? Salty foods. ? Foods that are high in:  Potassium.  Calcium.  Protein.  Do not use any products that contain nicotine or tobacco, such as cigarettes and e-cigarettes. If you need help quitting, ask your doctor.  Keep track of your blood pressure at home. Tell your doctor about any changes.  If you have diabetes, keep track of your blood sugar as told by your doctor.  Try to stay at a healthy weight. If you need help, ask your doctor.  Exercise at least 30 minutes a day, 5 days a week.  Stay up-to-date with your shots (immunizations) as told by your doctor.  Keep all follow-up visits as told by your doctor. This is important. Contact a doctor if:  Your symptoms get worse.  You have new symptoms. Get help right away if:  You have symptoms of end-stage  kidney disease. These may include: ? Headaches. ? Numbness in your hands or feet. ? Easy bruising. ? Having hiccups often. ? Chest pain. ? Shortness of breath. ? Stopping of menstrual periods in women.  You have a fever.  You have very little pee (urine).  You have pain or bleeding when you pee. Summary  Chronic kidney disease (CKD) happens when the kidneys are damaged over a long period of time.  Most of the time, this condition does not go away, but it can usually be controlled. Steps must be taken to slow down the kidney damage or to stop it from getting worse.  Treatment may include a combination of medicines and lifestyle changes. This information is not intended to replace advice given to you by your health care provider. Make sure you discuss any questions you have with your health care provider. Document Revised: 11/13/2017 Document Reviewed: 01/05/2017 Elsevier Patient Education  2020 Elsevier Inc.  

## 2019-12-15 NOTE — Assessment & Plan Note (Signed)
Noted on echo, continue collaboration with cardiology.

## 2019-12-15 NOTE — Assessment & Plan Note (Signed)
Chronic, ongoing.  Reports she is taking medication as ordered.  Continue current medication regimen.  CMP next visit.  Continue to collaborate with nephrology and cardiology.

## 2019-12-15 NOTE — Assessment & Plan Note (Signed)
Chronic, ongoing.  Continue current medication regimen and collaboration with nephrology.  CMP next visit.

## 2019-12-15 NOTE — Assessment & Plan Note (Addendum)
Chronic, ongoing.  Will maintain Lyrica at 25 MG due to recent altered state with trial of increase.  Monitor kidney function and adjust dose as needed. Difficult pain control case, if ongoing issues would consider referral to pain management.  Goal is to avoid polypharmacy.  Will consider neurology referral next visit, patient wishes to discuss further.  Refuses home OT/PT.

## 2019-12-19 NOTE — Chronic Care Management (AMB) (Signed)
  Care Management   Note  12/19/2019 Name: [name] MRN: [id number] DOB: [birth year]  Tonya [name] is a 74 y.o. year old female who is a primary care patient of [name], NP and is actively engaged with the care management team. I reached out to [name] by phone today to assist with re-scheduling a follow up appointment with the Pharmacist  Follow up plan: The care management team will reach out to the patient again over the next 7 days.   [place], [place] [address] Direct Dial: [email or web address]  Website: [email or web address]

## 2019-12-21 ENCOUNTER — Telehealth: Payer: Self-pay | Admitting: Nurse Practitioner

## 2019-12-21 NOTE — Chronic Care Management (AMB) (Signed)
  Care Management   Note  12/21/2019 Name: LILI HARTS MRN: [id number] DOB: [birth year]  SHALONDRA WUNSCHEL is a 74 y.o. year old female who is a primary care patient of [name], NP and is actively engaged with the care management team. I reached out to [name] by phone today to assist with re-scheduling a follow up appointment with the Pharmacist  Follow up plan: Telephone appointment with care management team member scheduled for: 01/06/20  [place], [employer]  [place],  [address] Direct Dial: [email or web address]  Website: [email or web address]

## 2019-12-26 ENCOUNTER — Other Ambulatory Visit: Payer: Self-pay

## 2019-12-26 NOTE — Progress Notes (Signed)
Patient pre screened for office appointment, no questions or concerns today. Patient reminded of upcoming appointment time and date. 

## 2019-12-27 ENCOUNTER — Inpatient Hospital Stay: Payer: Medicare Other

## 2019-12-27 ENCOUNTER — Other Ambulatory Visit: Payer: Self-pay

## 2019-12-27 ENCOUNTER — Inpatient Hospital Stay: Payer: Medicare Other | Attending: Oncology

## 2019-12-27 ENCOUNTER — Inpatient Hospital Stay (HOSPITAL_BASED_OUTPATIENT_CLINIC_OR_DEPARTMENT_OTHER): Payer: Medicare Other | Admitting: Oncology

## 2019-12-27 ENCOUNTER — Encounter: Payer: Self-pay | Admitting: Oncology

## 2019-12-27 VITALS — BP 137/83 | HR 85 | Temp 96.9°F | Resp 16 | Wt 180.1 lb

## 2019-12-27 DIAGNOSIS — D631 Anemia in chronic kidney disease: Secondary | ICD-10-CM

## 2019-12-27 DIAGNOSIS — N184 Chronic kidney disease, stage 4 (severe): Secondary | ICD-10-CM | POA: Diagnosis not present

## 2019-12-27 DIAGNOSIS — R5382 Chronic fatigue, unspecified: Secondary | ICD-10-CM | POA: Diagnosis not present

## 2019-12-27 DIAGNOSIS — D500 Iron deficiency anemia secondary to blood loss (chronic): Secondary | ICD-10-CM | POA: Diagnosis not present

## 2019-12-27 DIAGNOSIS — D509 Iron deficiency anemia, unspecified: Secondary | ICD-10-CM | POA: Diagnosis not present

## 2019-12-27 LAB — CBC WITH DIFFERENTIAL/PLATELET
Abs Immature Granulocytes: 0.07 10*3/uL (ref 0.00–0.07)
Basophils Absolute: 0 10*3/uL (ref 0.0–0.1)
Basophils Relative: 1 %
Eosinophils Absolute: 0.2 10*3/uL (ref 0.0–0.5)
Eosinophils Relative: 2 %
HCT: 33 % — ABNORMAL LOW (ref 36.0–46.0)
Hemoglobin: 9.8 g/dL — ABNORMAL LOW (ref 12.0–15.0)
Immature Granulocytes: 1 %
Lymphocytes Relative: 23 %
Lymphs Abs: 1.6 10*3/uL (ref 0.7–4.0)
MCH: 26.6 pg (ref 26.0–34.0)
MCHC: 29.7 g/dL — ABNORMAL LOW (ref 30.0–36.0)
MCV: 89.4 fL (ref 80.0–100.0)
Monocytes Absolute: 0.4 10*3/uL (ref 0.1–1.0)
Monocytes Relative: 5 %
Neutro Abs: 4.6 10*3/uL (ref 1.7–7.7)
Neutrophils Relative %: 68 %
Platelets: 248 10*3/uL (ref 150–400)
RBC: 3.69 MIL/uL — ABNORMAL LOW (ref 3.87–5.11)
RDW: 15.2 % (ref 11.5–15.5)
WBC: 6.9 10*3/uL (ref 4.0–10.5)
nRBC: 0 % (ref 0.0–0.2)

## 2019-12-27 LAB — IRON AND TIBC
Iron: 59 ug/dL (ref 28–170)
Saturation Ratios: 18 % (ref 10.4–31.8)
TIBC: 322 ug/dL (ref 250–450)
UIBC: 263 ug/dL

## 2019-12-27 LAB — RETIC PANEL
Immature Retic Fract: 13.4 % (ref 2.3–15.9)
RBC.: 3.64 MIL/uL — ABNORMAL LOW (ref 3.87–5.11)
Retic Count, Absolute: 67.7 10*3/uL (ref 19.0–186.0)
Retic Ct Pct: 1.9 % (ref 0.4–3.1)
Reticulocyte Hemoglobin: 29.4 pg (ref >27.9)

## 2019-12-27 LAB — FERRITIN: Ferritin: 92 ng/mL (ref 11–307)

## 2019-12-27 NOTE — Progress Notes (Signed)
Pre-assessment on patient yesterday.

## 2019-12-27 NOTE — Progress Notes (Signed)
Hematology/Oncology  Follow up note [facility] [phone] [phone]   Patient Care Team: [name], NP as PCP - General (Nurse Practitioner) [name], MD as PCP - Cardiology (Cardiology) Solum, Betsey Holiday, MD as Physician Assistant (Endocrinology) Lavonia Dana, MD as Consulting Physician (Nephrology) [name], MD as Consulting Physician (Cardiology) De Hollings[place], [facility] as Pharmacist  REFERRING PROVIDER: Arlice Colt CHIEF COMPLAINTS/REASON FOR VISIT:  Follow-up for anemia  HISTORY OF PRESENTING ILLNESS:  Tonya [name] is a  74 y.o.  female with PMH listed below who was referred to me for evaluation of anemia Reviewed patient's recent labs that was done at [facility] office. Labs revealed anemia with hemoglobin of 8.9, MCV 82, normal platelet and wbc  Reviewed patient's previous labs ordered by primary care physician's office, anemia is chronic onset , duration is since 2017 No aggravating or improving factors.  Associated signs and symptoms: Patient reports fatigue.  SOB with exertion.  Denies weight loss, easy bruising, hematochezia, hemoptysis, hematuria. Context: History of GI bleeding: denies               History of Chronic kidney disease: CKD, follows up with Dr.Kolluru.                History of autoimmune disease: denies               History of hemolytic anemia. denies               Last colonoscopy: underwent colonoscopy on 06/06/2019.  Due to poor preparation, patient was scheduled to repeat colonoscopy due to suboptimal bowel preparation.  Patient told me that she is waiting for insurance approval for repeat procedure.  INTERVAL HISTORY Tonya [name] is a 74 y.o. female who has above history reviewed by me today presents for follow up visit for management of anemia Problems and complaints are listed below: Patient has no new complaints today. She feels well. Chronic fatigue is at baseline. She  takes oral iron supplementation 325 mg twice daily. Denies any black tarry stool or blood in the stool.   Review of Systems  Constitutional: Positive for fatigue. Negative for appetite change, chills and fever.  HENT:   Negative for hearing loss and voice change.   Eyes: Negative for eye problems.  Respiratory: Negative for chest tightness and cough.   Cardiovascular: Negative for chest pain.  Gastrointestinal: Negative for abdominal distention, abdominal pain and blood in stool.  Endocrine: Negative for hot flashes.  Genitourinary: Negative for difficulty urinating and frequency.   Musculoskeletal: Negative for arthralgias.  Skin: Negative for itching and rash.  Neurological: Negative for extremity weakness.  Hematological: Negative for adenopathy.  Psychiatric/Behavioral: Negative for confusion.    MEDICAL HISTORY:  Past Medical History:  Diagnosis Date  . (HFpEF) heart failure with preserved ejection fraction ([place])    a. 05/2017 Echo: EF 55-60%, Gr1 DD, mildly dil LA.  . CKD (chronic kidney disease), stage III   . Diabetes mellitus without complication ([place])   . Hyperlipidemia   . Hypertension   . Stroke [facility])    a. 08/2017.    SURGICAL HISTORY: Past Surgical History:  Procedure Laterality Date  . COLONOSCOPY  12/15/2006  . COLONOSCOPY WITH PROPOFOL N/A 06/06/2019   Procedure: COLONOSCOPY WITH PROPOFOL;  Surgeon: [name], MD;  Location: [facility] ENDOSCOPY;  Service: Gastroenterology;  Laterality: N/A;  . TUBAL LIGATION      SOCIAL HISTORY: Social History   Socioeconomic History  .  Marital status: Married    Spouse name: Not on file  . Number of children: Not on file  . Years of education: Not on file  . Highest education level: GED or equivalent  Occupational History  . Occupation: retired  Tobacco Use  . Smoking status: Never Smoker  . Smokeless tobacco: Never Used  Substance and Sexual Activity  . Alcohol use: No    Alcohol/week: 0.0 standard drinks  . Drug  use: No  . Sexual activity: Yes  Other Topics Concern  . Not on file  Social History Narrative  . Not on file   Social Determinants of Health   Financial Resource Strain:   . Difficulty of Paying Living Expenses: Not on file  Food Insecurity:   . Worried About Charity fundraiser in the Last Year: Not on file  . Ran Out of Food in the Last Year: Not on file  Transportation Needs:   . Lack of Transportation (Medical): Not on file  . Lack of Transportation (Non-Medical): Not on file  Physical Activity:   . Days of Exercise per Week: Not on file  . Minutes of Exercise per Session: Not on file  Stress:   . Feeling of Stress : Not on file  Social Connections:   . Frequency of Communication with Friends and Family: Not on file  . Frequency of Social Gatherings with Friends and Family: Not on file  . Attends Religious Services: Not on file  . Active Member of Clubs or Organizations: Not on file  . Attends Archivist Meetings: Not on file  . Marital Status: Not on file  Intimate Partner Violence:   . Fear of Current or Ex-Partner: Not on file  . Emotionally Abused: Not on file  . Physically Abused: Not on file  . Sexually Abused: Not on file    FAMILY HISTORY: Family History  Problem Relation Age of Onset  . Diabetes Mother   . Stroke Mother   . Hyperlipidemia Father   . Hypertension Father   . Diabetes Sister   . Diabetes Brother   . Gout Son   . Diabetes Brother   . Kidney disease Son   . Lymphoma Sister   . Heart attack Sister   . Breast cancer Neg Hx     ALLERGIES:  is allergic to atorvastatin; gabapentin; pravastatin; and trulicity [dulaglutide].  MEDICATIONS:  Current Outpatient Medications  Medication Sig Dispense Refill  . aspirin 81 MG tablet Take 81 mg by mouth daily.    . carvedilol (COREG) 3.125 MG tablet Take 1 tablet (3.125 mg total) by mouth 2 (two) times daily with a meal. 180 tablet 3  . clopidogrel (PLAVIX) 75 MG tablet TAKE 1 TABLET BY  MOUTH  DAILY 90 tablet 3  . DOK 100 MG capsule TAKE ONE CAPSULE BY MOUTH EVERY DAY. DO NOT TAKE IF YOU HAVE LOOSE STOOLS 90 capsule 0  . ferrous sulfate 325 (65 FE) MG EC tablet Take 1 tablet (325 mg total) by mouth 3 (three) times daily with meals. 120 tablet 3  . furosemide (LASIX) 20 MG tablet Take 1 tablet (20 mg total) by mouth daily. 30 tablet 0  . HUMALOG KWIKPEN 100 UNIT/ML KiwkPen TK 12 UNITS BEFORE LUNCH AND 8 UNITS BEFORE SUPPER.  11  . insulin detemir (LEVEMIR) 100 UNIT/ML injection Inject 12 Units into the skin every morning.     [name] omeprazole (PRILOSEC) 20 MG capsule TAKE 1 CAPSULE BY MOUTH  TWICE DAILY 180 capsule  3  . pioglitazone (ACTOS) 30 MG tablet Take 30 mg by mouth daily.    . pregabalin (LYRICA) 25 MG capsule Take 1 capsule (25 mg total) by mouth at bedtime. 30 capsule 0  . rosuvastatin (CRESTOR) 5 MG tablet Take 1 tablet (5 mg total) by mouth daily. 90 tablet 3   No current facility-administered medications for this visit.     PHYSICAL EXAMINATION: ECOG PERFORMANCE STATUS: 1 - Symptomatic but completely ambulatory Vitals:   12/27/19 1331  BP: 137/83  Pulse: 85  Resp: 16  Temp: (!) 96.9 F (36.1 C)   Filed Weights   12/27/19 1331  Weight: 180 lb 1.6 oz (81.7 kg)    Physical Exam Constitutional:      General: She is not in acute distress. HENT:     Head: Normocephalic and atraumatic.  Eyes:     General: No scleral icterus.    Pupils: Pupils are equal, round, and reactive to light.  Cardiovascular:     Rate and Rhythm: Normal rate and regular rhythm.     Heart sounds: Normal heart sounds.  Pulmonary:     Effort: Pulmonary effort is normal. No respiratory distress.     Breath sounds: No wheezing.  Abdominal:     General: Bowel sounds are normal. There is no distension.     Palpations: Abdomen is soft. There is no mass.     Tenderness: There is no abdominal tenderness.  Musculoskeletal:        General: No deformity. Normal range of motion.      Cervical back: Normal range of motion and neck supple.  Skin:    General: Skin is warm and dry.     Findings: No erythema or rash.  Neurological:     Mental Status: She is alert and oriented to person, place, and time.     Cranial Nerves: No cranial nerve deficit.     Coordination: Coordination normal.  Psychiatric:        Behavior: Behavior normal.        Thought Content: Thought content normal.      LABORATORY DATA:  I have reviewed the data as listed Lab Results  Component Value Date   WBC 6.9 12/27/2019   HGB 9.8 (L) 12/27/2019   HCT 33.0 (L) 12/27/2019   MCV 89.4 12/27/2019   PLT 248 12/27/2019   Recent Labs    12/30/18 1012 02/15/19 1405 06/24/19 1331 12/01/19 1732 12/02/19 0912 12/03/19 0850  NA  --    < > 142 144 144 143  K  --   --  4.3 5.1 5.1 4.8  CL  --   --  112* 113* 114* 113*  CO2  --   --  23 21* 21* 20*  GLUCOSE  --    < > 306* 143* 176* 113*  BUN  --    < > 51* 55* 50* 56*  CREATININE  --   --  2.24* 2.15* 1.93* 2.14*  CALCIUM  --   --  8.8* 9.1 8.8* 8.9  GFRNONAA  --   --  21* 22* 26* 23*  GFRAA  --   --  25* 26* 30* 26*  PROT 6.2*  --  6.3* 6.9 5.8*  --   ALBUMIN 3.3*  --  3.4* 3.5 3.1*  --   AST 22  --  16 25 21   --   ALT 14  --  12 17 14   --   ALKPHOS 93  --  96 85 73  --   BILITOT 0.7  --  0.4 0.6 0.6  --   BILIDIR <0.1  --   --   --   --   --   IBILI NOT CALCULATED  --   --   --   --   --    < > = values in this interval not displayed.   Iron/TIBC/Ferritin/ %Sat    Component Value Date/Time   IRON 59 12/27/2019 1310   IRON 48 02/15/2019 1405   TIBC 322 12/27/2019 1310   TIBC 438 02/15/2019 1405   FERRITIN 92 12/27/2019 1310   FERRITIN 15 02/15/2019 1405   IRONPCTSAT 18 12/27/2019 1310   IRONPCTSAT 11 (L) 02/15/2019 1405        ASSESSMENT & PLAN:  1. Iron deficiency anemia due to chronic blood loss   2. Anemia due to stage 4 chronic kidney disease ([place])   3. Chronic kidney disease, stage 4, severely decreased GFR (HCC)     History of iron deficiency anemia, Labs reviewed and discussed with patient. Ferritin has been stable at 92, iron saturation 18. Patient has been on oral iron supplementation.  She declines IV iron treatments. Continue oral iron supplementation  Anemia in chronic kidney disease, ideally would like to further improve her iron level and increase her ferritin to be in 200s to 300s range. Patient declines IV iron treatments.  Continue oral iron. Hemoglobin is 9.8, close to 10.  I discussed with her about the rationale and potential side effects of erythropoietin replacement therapy.  She agrees with the plan. I also want to wait until she has her colonoscopy done before proceed with erythropoietin replacement therapy. Patient had colonoscopy done in June 2020 due to poor preparation, patient was advised to have a repeat colonoscopy and this was not done.  I contacted Dr. Vicente Males gastroenterology's office.  Dr. Georgeann Oppenheim office will contact patient again for a repeat colonoscopy.  CKD, avoid nephrotoxins.  All questions were answered. The patient knows to call the clinic with any problems questions or concerns.  Return of visit: 3 months    Earlie Server, MD, PhD Hematology Oncology [facility] Pager- [phone] 12/27/2019

## 2019-12-28 ENCOUNTER — Ambulatory Visit: Payer: Medicare Other | Admitting: Oncology

## 2019-12-28 ENCOUNTER — Other Ambulatory Visit: Payer: Self-pay

## 2019-12-28 ENCOUNTER — Ambulatory Visit: Payer: Medicare Other

## 2019-12-28 ENCOUNTER — Other Ambulatory Visit: Payer: Medicare Other

## 2019-12-28 DIAGNOSIS — D500 Iron deficiency anemia secondary to blood loss (chronic): Secondary | ICD-10-CM

## 2020-01-05 ENCOUNTER — Other Ambulatory Visit: Payer: Self-pay

## 2020-01-05 ENCOUNTER — Ambulatory Visit (INDEPENDENT_AMBULATORY_CARE_PROVIDER_SITE_OTHER): Payer: Medicare Other | Admitting: Family

## 2020-01-05 ENCOUNTER — Encounter: Payer: Self-pay | Admitting: Family

## 2020-01-05 VITALS — BP 140/84 | HR 89 | Ht 67.0 in | Wt 180.0 lb

## 2020-01-05 DIAGNOSIS — E782 Mixed hyperlipidemia: Secondary | ICD-10-CM | POA: Diagnosis not present

## 2020-01-05 DIAGNOSIS — N184 Chronic kidney disease, stage 4 (severe): Secondary | ICD-10-CM

## 2020-01-05 DIAGNOSIS — I1310 Hypertensive heart and chronic kidney disease without heart failure, with stage 1 through stage 4 chronic kidney disease, or unspecified chronic kidney disease: Secondary | ICD-10-CM | POA: Diagnosis not present

## 2020-01-05 DIAGNOSIS — D631 Anemia in chronic kidney disease: Secondary | ICD-10-CM

## 2020-01-05 DIAGNOSIS — I5189 Other ill-defined heart diseases: Secondary | ICD-10-CM | POA: Diagnosis not present

## 2020-01-05 NOTE — Progress Notes (Signed)
Office Visit    Patient Name: Tonya [name] Date of Encounter: 01/05/2020  Primary Care Provider:  [name], NP Primary Cardiologist:  Ida Rogue, MD Electrophysiologist:  None   Chief Complaint    Tonya [name] is a 74 y.o. 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 presents today for f/u of hypertensive heart disease.   Past Medical History    Past Medical History:  Diagnosis Date  . (HFpEF) heart failure with preserved ejection fraction ([place])    a. 05/2017 Echo: EF 55-60%, Gr1 DD, mildly dil LA.  . CKD (chronic kidney disease), stage III   . Diabetes mellitus without complication ([place])   . Hyperlipidemia   . Hypertension   . Stroke [facility])    a. 08/2017.   Past Surgical History:  Procedure Laterality Date  . COLONOSCOPY  12/15/2006  . COLONOSCOPY WITH PROPOFOL N/A 06/06/2019   Procedure: COLONOSCOPY WITH PROPOFOL;  Surgeon: [name], MD;  Location: [facility] ENDOSCOPY;  Service: Gastroenterology;  Laterality: N/A;  . TUBAL LIGATION      Allergies  Allergies  Allergen Reactions  . Atorvastatin     Myalgias   . Gabapentin Other (See Comments)    Speech impairment   . Pravastatin     Myalgias   . Trulicity [Dulaglutide] Hives    History of Present Illness    Tonya [name] is a 74 y.o. 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 She was last seen 12/27/18 by Dr. [name] Situ.  Admitted 12/01/19 - 12/03/19 with dysarthria. CT head and MRi brain negative for stroke. Suspected to be secondary to polypharmacy. She had carotid doppler which were negative for significant stenosis. Her Lyrica dose was reduced. She was recommended to follow up with neurology. She was prescribed 30 days of Lasix 20mg  daily at time of discharge. She was discharge home  with home health. Benicar stopped for unclear reason.  Seen by nephrology 12/07/19, her Furosemide was continued. Creatinine 2.26 and GFR 24.   Seen by hemtology 12/27/19 for anemia. Recommended for oral iron supplementation. She has declined IV iron treatments despite ferritin 92 and goal of 200-300. In setting of anemia of CKD she was recommended for erythropoietin replacement and agreed. She has an upcoming colonoscopy.   No chest pain, pressure, tightness. No SOB at rest. Endorses some DOE. This is stable at her baseline.   No formal exercise regimen. Spends most of her day playing games on her tablet. We discussed adding a walking regimen.    BP at home 120s-130s. Denies episodes of hypotension, lightheadedness, dizziness.  Her olmesartan was discontinued during recent hospitalization for unclear reason.  At recent nephrology visit they discussed the reality based on labs-encouraged her to follow-up with her nephrologist for this.  Endorses some LE edema that is worse at the end of the day and improved by morning. We discussed elevating her legs when sitting or wearing compression stockings. We also discussed that this could be a side effect of Lyrica.   EKGs/Labs/Other Studies Reviewed:   The following studies were reviewed today: Echo 12/02/19  1. Left ventricular ejection fraction, by visual estimation, is 60 to 65%. The left ventricle has normal function. There is moderately increased left ventricular hypertrophy.  2. Left ventricular  diastolic parameters are consistent with Grade I diastolic dysfunction (impaired relaxation).  3. The left ventricle has no regional wall motion abnormalities.  4. Global right ventricle has normal systolic function.The right ventricular size is normal. No increase in right ventricular wall thickness.  5. Left atrial size was normal.  6. TR signal is inadequate for assessing pulmonary artery systolic pressure.  EKG:  EKG is ordered today.  The ekg  ordered today demonstrates SR 89 bpm   Recent Labs: 12/02/2019: ALT 14 12/03/2019: BUN 56; Creatinine, Ser 2.14; Magnesium 2.3; Potassium 4.8; Sodium 143 12/27/2019: Hemoglobin 9.8; Platelets 248  Recent Lipid Panel    Component Value Date/Time   CHOL 159 12/02/2019 0912   CHOL 165 05/18/2019 1102   TRIG 75 12/02/2019 0912   TRIG 78 05/18/2019 1102   HDL 64 12/02/2019 0912   HDL 58 09/10/2016 1112   CHOLHDL 2.5 12/02/2019 0912   VLDL 15 12/02/2019 0912   VLDL 16 05/18/2019 1102   LDLCALC 80 12/02/2019 0912   LDLCALC 134 (H) 09/10/2016 1112   LDLDIRECT 67 12/30/2018 1012    Home Medications   Current Meds  Medication Sig  . aspirin 81 MG tablet Take 81 mg by mouth daily.  . carvedilol (COREG) 3.125 MG tablet Take 1 tablet (3.125 mg total) by mouth 2 (two) times daily with a meal.  . clopidogrel (PLAVIX) 75 MG tablet TAKE 1 TABLET BY MOUTH  DAILY  . DOK 100 MG capsule TAKE ONE CAPSULE BY MOUTH EVERY DAY. DO NOT TAKE IF YOU HAVE LOOSE STOOLS  . ferrous sulfate 325 (65 FE) MG EC tablet Take 1 tablet (325 mg total) by mouth 3 (three) times daily with meals.  . furosemide (LASIX) 20 MG tablet Take 1 tablet (20 mg total) by mouth daily.  [name] HUMALOG KWIKPEN 100 UNIT/ML KiwkPen TK 12 UNITS BEFORE LUNCH AND 8 UNITS BEFORE SUPPER.  [name] insulin detemir (LEVEMIR) 100 UNIT/ML injection Inject 12 Units into the skin every morning.   [name] omeprazole (PRILOSEC) 20 MG capsule TAKE 1 CAPSULE BY MOUTH  TWICE DAILY  . pioglitazone (ACTOS) 30 MG tablet Take 30 mg by mouth daily.  . pregabalin (LYRICA) 25 MG capsule Take 1 capsule (25 mg total) by mouth at bedtime.  . rosuvastatin (CRESTOR) 5 MG tablet Take 1 tablet (5 mg total) by mouth daily.    Review of Systems      Review of Systems  Constitution: Negative for chills, fever and malaise/fatigue.  Cardiovascular: Positive for dyspnea on exertion. Negative for chest pain, leg swelling, near-syncope, orthopnea, palpitations and syncope.  Respiratory:  Negative for cough, shortness of breath and wheezing.   Gastrointestinal: Negative for nausea and vomiting.  Neurological: Negative for dizziness, light-headedness and weakness.   All other systems reviewed and are otherwise negative except as noted above.  Physical Exam    VS:  BP 140/84 (BP Location: Right Arm, Patient Position: Sitting, Cuff Size: Normal)   Pulse 89   Ht 5\' 7"  (1.702 m)   Wt 180 lb (81.6 kg)   LMP  (LMP Unknown)   SpO2 98%   BMI 28.19 kg/m  , BMI Body mass index is 28.19 kg/m. GEN: Well nourished, overweight, well developed, in no acute distress. HEENT: normal. Neck: Supple, no JVD, carotid bruits, or masses. Cardiac: RRR, no murmurs, rubs, or gallops. No clubbing, cyanosis, edema.  Radials/PT 2+ and equal bilaterally.  Respiratory:  Respirations regular and unlabored, clear to auscultation bilaterally. GI: Soft, nontender, nondistended. MS: No deformity  or atrophy. Skin: Warm and dry, no rash. Neuro:  Strength and sensation are intact. Psych: Normal affect.  Accessory Clinical Findings    ECG personally reviewed by me today - SR 89 bpm with stable lateral t wave inversion - no acute changes.  Assessment & Plan    1. Hypertensive heart disease with CKD IV - Echo 12/02/2019 LVEF 60 to 65%, moderate LVH, grade 1 diastolic dysfunction, RV normal size and function, no significant valvular dysfunction. Present management includes beta blocker, loop diuretic. ARB, as below.   HTN -BP elevated today 140/84.  Reports with pressure readings at home are 505W to 979Y systolic.  Recent office visit with neurology there was consideration to restart her Benicar which was stopped during hospitalization for unclear reason.  I have encouraged her to reach out to her nephrologist regarding this. Unclear whether she is taking, her family helps her to manage her medications.   [place] with nephrology. 12/03/19 creatinine 2.14, GFR 26. Careful titration of  antihypertensive and diuretic agents.   2. Gr1 DD - Noted on echo 11/2019. Likely secondary to longstanding history of HTN. Euvolemic on exam today. Follows with nephrology for Lasix dosing.   3. DM2 - 11/1819 A1C 9.8. Follows with Dr. [name] of endocrinology. History of intolerance to multiple diabetic medications. She does take Actos, which could contribute to heart failure and would recommend alternative agent.   4. HLD - Lipid profile 12/02/19 LDL 80. Normal liver function 12/02/19. LDL goal <100. Continue Rosuvastatin 5mg  daily.   5. Anemia of CKD - Follows with hematology. 12/27/19 Hb 9.8, ferritin 92.   Disposition: Follow up in 1 year(s) with Dr. Wallie Renshaw, NP 01/05/2020, 8:08 PM

## 2020-01-05 NOTE — Patient Instructions (Addendum)
Medication Instructions:  No medication changes today.  *If you need a refill on your cardiac medications before your next appointment, please call your pharmacy*  Lab Work: None ordered today.  Testing/Procedures: EKG shows normal sinus rhythm which is a good result.   You had an ultrasound of your heart (an echocardiogram) during your recent hospitalization shows normal pumping function and mild stiffening of the heart. This is stable compared to your echocardiogram in December 2019. This is likely due to your long history of high blood pressure and our plan would be to continue to control your blood pressure well with the assistance of nephrology.   Follow-Up: At [facility], you and your health needs are our priority.  As part of our continuing mission to provide you with exceptional heart care, we have created designated Provider Care Teams.  These Care Teams include your primary Cardiologist (physician) and Advanced Practice Providers (APPs -  Physician Assistants and Nurse Practitioners) who all work together to provide you with the care you need, when you need it.  Your next appointment:  Follow up in 1 year with Dr. [name] Situ.  Other:   For your lower extremity swelling would recommend elevating your legs when sitting and wearing compression stockings. Continue to avoid salt as this can increase lower extremity swelling. The swelling is probably from multiple things like your kidney disease, heart not relaxing as well, and a side effect of Lyrica. The plan above will help to keep it under control. Continue fluid pill Furosemide (Lasix) as directed by your kidney doctor.    Would recommend calling your nephrologist (kidney doctor) to ask about your Olmesartan (Benicar). This medication was stopped while in the hospital. Your nephrologist mentioned possibly restarting it at your last office visit based on labs. It is a medication to help with your blood pressure and to help protect  your kidneys.    Recommend increasing physical activity.

## 2020-01-06 ENCOUNTER — Other Ambulatory Visit: Payer: Self-pay | Admitting: Nurse Practitioner

## 2020-01-06 ENCOUNTER — Ambulatory Visit: Payer: Medicare Other | Admitting: Pharmacist

## 2020-01-06 ENCOUNTER — Ambulatory Visit: Payer: Medicare Other | Admitting: Nurse Practitioner

## 2020-01-06 NOTE — Chronic Care Management (AMB) (Signed)
  Chronic Care Management   Note  01/06/2020 Name: Tonya [name] MRN: [id number] DOB: 10/24/1946  Tonya [name] is a 74 y.o. year old female who is a primary care patient of Cannady, Tonya Faster, NP. The CCM team was consulted for assistance with chronic disease management and care coordination needs.    Attempted to contact patient for medication management review. Unable to leave messages on either listed number. Left message on her emergency contact, daughter Tonya [name], asking for a return call.   Will plan to meet with patient and PCP next week.   Of note, cardiologist recommended d/c of pioglitazone given HF risk. Patient has f/u with endocrinology 01/18/20 per Epic. There is also mention of trying to figure out why olmesartan was d/c, and that patient is supposed to follow up with nephrology about this. Per nephrology note, they were considering restarting pending labs, but no mention of restarted in lab work note. Will plan to collaborate and coordinate with these specialists.   Tonya [name], PharmD, [place] [phone]

## 2020-01-06 NOTE — Chronic Care Management (AMB) (Signed)
  Chronic Care Management   Note  01/06/2020 Name: ALESHA JAFFEE MRN: [id number] DOB: 18-Aug-1946  TWILIA YAKLIN is a 74 y.o. year old female who is a primary care patient of Cannady, [name], NP. The CCM team was consulted for assistance with chronic disease management and care coordination needs.  Received voicemail back from patient's daughter. I attempted to call patient's daughter back, but the call went to voicemail. I requested that patient's daughter accompany her mother to the appointment (as I was previously told that her daughter helps manage her medications), but at least send patient with all of her medications to the appointment. I will review at that time.     [name], PharmD, [place] [phone]

## 2020-01-06 NOTE — Telephone Encounter (Signed)
Forwarding medication refill request to PCP for review. 

## 2020-01-09 ENCOUNTER — Encounter: Payer: Self-pay | Admitting: Gastroenterology

## 2020-01-09 ENCOUNTER — Other Ambulatory Visit: Payer: Self-pay

## 2020-01-11 ENCOUNTER — Ambulatory Visit (INDEPENDENT_AMBULATORY_CARE_PROVIDER_SITE_OTHER): Payer: Medicare Other | Admitting: Nurse Practitioner

## 2020-01-11 ENCOUNTER — Ambulatory Visit (INDEPENDENT_AMBULATORY_CARE_PROVIDER_SITE_OTHER): Payer: Medicare Other | Admitting: Pharmacist

## 2020-01-11 ENCOUNTER — Telehealth: Payer: Self-pay

## 2020-01-11 ENCOUNTER — Other Ambulatory Visit: Payer: Self-pay

## 2020-01-11 ENCOUNTER — Encounter: Payer: Self-pay | Admitting: Nurse Practitioner

## 2020-01-11 VITALS — BP 136/88 | HR 91 | Temp 98.5°F | Ht 67.0 in | Wt 178.0 lb

## 2020-01-11 DIAGNOSIS — E785 Hyperlipidemia, unspecified: Secondary | ICD-10-CM

## 2020-01-11 DIAGNOSIS — N184 Chronic kidney disease, stage 4 (severe): Secondary | ICD-10-CM | POA: Diagnosis not present

## 2020-01-11 DIAGNOSIS — E1169 Type 2 diabetes mellitus with other specified complication: Secondary | ICD-10-CM

## 2020-01-11 DIAGNOSIS — I5032 Chronic diastolic (congestive) heart failure: Secondary | ICD-10-CM | POA: Diagnosis not present

## 2020-01-11 DIAGNOSIS — I519 Heart disease, unspecified: Secondary | ICD-10-CM

## 2020-01-11 DIAGNOSIS — E1140 Type 2 diabetes mellitus with diabetic neuropathy, unspecified: Secondary | ICD-10-CM | POA: Diagnosis not present

## 2020-01-11 DIAGNOSIS — E1165 Type 2 diabetes mellitus with hyperglycemia: Secondary | ICD-10-CM

## 2020-01-11 DIAGNOSIS — B0229 Other postherpetic nervous system involvement: Secondary | ICD-10-CM

## 2020-01-11 DIAGNOSIS — I1310 Hypertensive heart and chronic kidney disease without heart failure, with stage 1 through stage 4 chronic kidney disease, or unspecified chronic kidney disease: Secondary | ICD-10-CM

## 2020-01-11 DIAGNOSIS — D631 Anemia in chronic kidney disease: Secondary | ICD-10-CM

## 2020-01-11 DIAGNOSIS — Z794 Long term (current) use of insulin: Secondary | ICD-10-CM

## 2020-01-11 DIAGNOSIS — I5189 Other ill-defined heart diseases: Secondary | ICD-10-CM

## 2020-01-11 NOTE — Assessment & Plan Note (Signed)
Chronic with CKD.  Continue iron supplement daily and collaboration with hematology.

## 2020-01-11 NOTE — Assessment & Plan Note (Signed)
Chronic, ongoing.  Monitor closely due to AM low BS.  Continue collaboration with endocrinology. 

## 2020-01-11 NOTE — Assessment & Plan Note (Addendum)
Chronic, ongoing.  Recent A1C 8.9% in hospital. Continue current medication regimen and consider reduction if increased hypoglycemic episodes.  Continue to collaborate with Dr. [name] and CCM team.  Reviewed notes and cardiology has recommended discontinuation of Actos and forwarded their recommendations to endo.  Has upcoming appointment in February with endo.  Return in 3 months.

## 2020-01-11 NOTE — Progress Notes (Signed)
BP 136/88   Pulse 91   Temp 98.5 F (36.9 C) (Oral)   Ht 5\' 7"  (1.702 m)   Wt 178 lb (80.7 kg)   LMP  (LMP Unknown)   SpO2 98%   BMI 27.88 kg/m    Subjective:    Patient ID: Tonya [name], female    DOB: 1946/09/19, 74 y.o.   MRN: [id number]  HPI: Tonya [name] is a 74 y.o. female  Chief Complaint  Patient presents with  . Diabetes  . Hypertension  . Hyperlipidemia   DIABETES Last saw Dr. [name] 01/05/2020 with A1C 8.9%, her Actos continues at 30 mg and on recent cardiology visit it was recommended to discontinue this, note was forwarded to Dr. [name].  Does endorse she has been eating differently and focused more on diet.  Previously has tried [place] and [employer]. Hypoglycemic episodes:she states not lately, but has in past Polydipsia/polyuria:no Visual disturbance:no Chest pain:no Paresthesias:no Glucose Monitoring:yes Accucheck frequency: TID Fasting glucose: having occasional 50-60's in morning, this morning was 64 --- occasionally 130, no higher than this Post prandial: Evening: 136 to 244 Before meals:  Taking Insulin?:yes Long acting insulin: Levemir 8 units in evening Short acting insulin: Humalog 8 units -- usually takes this at 12 pm and 7 pm Blood Pressure Monitoring:a few times a week Retinal Examination:Not Up to Date Foot Exam:Up to Date Pneumovax:Up to Date Influenza:Up to Date Aspirin:yes  HYPERTENSION / HYPERLIPIDEMIA/HF Followed by cardiology, Dr. [name] Situ.  Continues on Carvedilol, Lasix, and Crestor. Last seen by cardiology on 01/05/20.  Her LVEF on 12/02/2019 was 60 to 65% with moderate LVH, and grade 1 diastolic dysfunction.  They recommend discontinuation of Actos and forwarded their note to Dr. [name] for review and consideration.  Discussed use of compression hose with her and she reports occasional use, but not consistent.  Does not weight herself  daily at home.   Satisfied with current treatment?yes Duration of hypertension:chronic BP monitoring frequency:a few times a week BP range:130-140/80's on average and she reports occasional SBP 160 BP medication side effects:no Duration of hyperlipidemia:chronic Cholesterol medication side effects:no Cholesterol supplements: none Medication compliance:good compliance Aspirin:yes Recent stressors:no Recurrent headaches:no Visual changes:no Palpitations:no Dyspnea:no Chest pain:no Lower extremity edema:no Dizzy/lightheaded:no  CHRONIC KIDNEY DISEASE Followed by nephrology and last seen 12/07/19. Benicar was discontinued during recent hospitalization, is followed by nephrology, will see if they wish to restart at her next visit with them.  Has underlying CKD with 12/03/19 labs showing CRT 2.14 and GFR 26.  Is also followed by hematology for chronic disease anemia and last seen 12/27/2019.  Plan is for erythropoietin after patient has repeat colonoscopy. CKD status: stable Medications renally dose: yes Previous renal evaluation: yes Pneumovax:  Up to Date Influenza Vaccine:  Up to Date   POSTHERPETIC NEURALGIA: Her pain is intermittent, sharp, burning to lower back.  10/10 at worst and 2/10 at best, on average pain is a 5/10.  [place] bothers it more, anything that touches.  Had shingles years ago and since that time has had ongoing issues with pain to lower right back. She is intolerant to Gabapentin and had issues with Pregabalin (change in mental status and hospital admission) + has tried Lidocaine patches with no benefit.  Has history of imaging to lumbar spine in 2016, where her pain is, and it showed "diffuse multilevel degenerative changes lumbar spine".  Discussed referral to pain clinic with her, which she is agreeable to.  Relevant past medical, surgical, family and social history  reviewed and updated as indicated. Interim medical history since our last visit  reviewed. Allergies and medications reviewed and updated.  Review of Systems  Constitutional: Negative for activity change, appetite change, diaphoresis, fatigue and fever.  Respiratory: Negative for cough, chest tightness and shortness of breath.   Cardiovascular: Negative for chest pain, palpitations and leg swelling.  Gastrointestinal: Negative for abdominal distention, abdominal pain, constipation, diarrhea, nausea and vomiting.  Endocrine: Negative for cold intolerance, heat intolerance, polydipsia, polyphagia and polyuria.  Musculoskeletal: Positive for back pain.  Neurological: Negative for dizziness, syncope, weakness, light-headedness, numbness and headaches.  Psychiatric/Behavioral: Negative.     Per HPI unless specifically indicated above     Objective:    BP 136/88   Pulse 91   Temp 98.5 F (36.9 C) (Oral)   Ht 5\' 7"  (1.702 m)   Wt 178 lb (80.7 kg)   LMP  (LMP Unknown)   SpO2 98%   BMI 27.88 kg/m   Wt Readings from Last 3 Encounters:  01/11/20 178 lb (80.7 kg)  01/05/20 180 lb (81.6 kg)  12/27/19 180 lb 1.6 oz (81.7 kg)    Physical Exam Vitals and nursing note reviewed.  Constitutional:      General: She is awake. She is not in acute distress.    Appearance: She is well-developed. She is not ill-appearing.  HENT:     Head: Normocephalic.     Right Ear: Hearing normal.     Left Ear: Hearing normal.  Eyes:     General: Lids are normal.        Right eye: No discharge.        Left eye: No discharge.     Conjunctiva/sclera: Conjunctivae normal.     Pupils: Pupils are equal, round, and reactive to light.  Neck:     Thyroid: No thyromegaly.     Vascular: No carotid bruit.  Cardiovascular:     Rate and Rhythm: Normal rate and regular rhythm.     Heart sounds: Normal heart sounds. No murmur. No gallop.   Pulmonary:     Effort: Pulmonary effort is normal. No accessory muscle usage or respiratory distress.     Breath sounds: Normal breath sounds.  Abdominal:      General: Bowel sounds are normal.     Palpations: Abdomen is soft.  Musculoskeletal:     Cervical back: Normal range of motion and neck supple.     Right lower leg: 1+ Edema present.     Left lower leg: 1+ Edema present.  Skin:    General: Skin is warm and dry.     Findings: No rash.  Neurological:     Mental Status: She is alert and oriented to person, place, and time.  Psychiatric:        Attention and Perception: Attention normal.        Mood and Affect: Mood normal.        Behavior: Behavior normal. Behavior is cooperative.        Thought Content: Thought content normal.        Judgment: Judgment normal.     Results for orders placed or performed in visit on 12/27/19  Retic Panel  Result Value Ref Range   Retic Ct Pct 1.9 0.4 - 3.1 %   RBC. 3.64 (L) 3.87 - 5.11 MIL/uL   Retic Count, Absolute 67.7 19.0 - 186.0 K/uL   Immature Retic Fract 13.4 2.3 - 15.9 %   Reticulocyte Hemoglobin 29.4 >27.9 pg  Ferritin  Result Value Ref Range   Ferritin 92 11 - 307 ng/mL  Iron and TIBC  Result Value Ref Range   Iron 59 28 - 170 ug/dL   TIBC 322 250 - 450 ug/dL   Saturation Ratios 18 10.4 - 31.8 %   UIBC 263 ug/dL  CBC with Differential/Platelet  Result Value Ref Range   WBC 6.9 4.0 - 10.5 K/uL   RBC 3.69 (L) 3.87 - 5.11 MIL/uL   Hemoglobin 9.8 (L) 12.0 - 15.0 g/dL   HCT 33.0 (L) 36.0 - 46.0 %   MCV 89.4 80.0 - 100.0 fL   MCH 26.6 26.0 - 34.0 pg   MCHC 29.7 (L) 30.0 - 36.0 g/dL   RDW 15.2 11.5 - 15.5 %   Platelets 248 150 - 400 K/uL   nRBC 0.0 0.0 - 0.2 %   Neutrophils Relative % 68 %   Neutro Abs 4.6 1.7 - 7.7 K/uL   Lymphocytes Relative 23 %   Lymphs Abs 1.6 0.7 - 4.0 K/uL   Monocytes Relative 5 %   Monocytes Absolute 0.4 0.1 - 1.0 K/uL   Eosinophils Relative 2 %   Eosinophils Absolute 0.2 0.0 - 0.5 K/uL   Basophils Relative 1 %   Basophils Absolute 0.0 0.0 - 0.1 K/uL   Immature Granulocytes 1 %   Abs Immature Granulocytes 0.07 0.00 - 0.07 K/uL      Assessment  & Plan:   Problem List Items Addressed This Visit      Cardiovascular and Mediastinum   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  Reports she is taking medication as ordered, daughter is prepping.  Continue current medication regimen and adjust as needed.  Had recent labs.  Continue to collaborate with nephrology and cardiology + CCM team in house.        Chronic heart failure with preserved ejection fraction (HFpEF) (HCC)    Chronic, ongoing with recent EF 60-65%.  Cardiology has recommended discontinuation of Actos and sent recommendation to endocrinology.  This would be beneficial change with her underlying HF.  Continue current heart medication regimen and collaboration with cardiology + CCM team in office.  Has upcoming endocrinology visit in February. - Reminded to call for an overnight weight gain of >2 pounds or a weekly weight weight of >5 pounds - not adding salt to his food and has been reading food labels. Reviewed the importance of keeping daily sodium intake to 2000mg  daily         Endocrine   Hyperlipidemia associated with type 2 diabetes mellitus (HCC)    Chronic, ongoing.  December LDL 80. Continue Rosuvastatin. Continue to collaborate with [facility].  Lipid panel next visit.      Uncontrolled type 2 diabetes mellitus with hyperglycemia, with long-term current use of insulin (HCC) - Primary    Chronic, ongoing.  Recent A1C 8.9% in hospital. Continue current medication regimen and consider reduction if increased hypoglycemic episodes.  Continue to collaborate with Dr. [name] and CCM team.  Reviewed notes and cardiology has recommended discontinuation of Actos and forwarded their recommendations to endo.  Has upcoming appointment in February with endo.  Return in 3 months.        Nervous and Auditory   Post herpetic neuralgia    Chronic with poor tolerance to Gabapentin and Lyrica and  minimal relief with simple treatment methods.  Referral to pain management placed.  Relevant Orders   Ambulatory referral to Pain Clinic     Genitourinary   Chronic kidney disease, stage 4, severely decreased GFR (HCC)    Chronic, ongoing.  Continue current medication regimen and collaboration with nephrology.  CMP next visit.  Will await their guidance on whether to restart Benicar.        Other   Encounter for long-term (current) use of insulin (HCC)    Chronic, ongoing.  Monitor closely due to AM low BS.  Continue collaboration with endocrinology.      Anemia in CKD (chronic kidney disease)    Chronic with CKD.  Continue iron supplement daily and collaboration with hematology.      Grade I diastolic dysfunction    Noted on echo, continue collaboration with cardiology.           Follow up plan: Return in about 3 months (around 04/10/2020) for T2DM, HTN/HLD, Pain, CKD.

## 2020-01-11 NOTE — Assessment & Plan Note (Signed)
Chronic, ongoing.  December LDL 80. Continue Rosuvastatin. Continue to collaborate with [facility].  Lipid panel next visit.

## 2020-01-11 NOTE — Assessment & Plan Note (Signed)
Chronic, ongoing with BP at goal today.  Reports she is taking medication as ordered, daughter is prepping.  Continue current medication regimen and adjust as needed.  Had recent labs.  Continue to collaborate with nephrology and cardiology + CCM team in house.

## 2020-01-11 NOTE — Patient Instructions (Signed)
-   Reminded to call for an overnight weight gain of >2 pounds or a weekly weight weight of >5 pounds - not adding salt to her food and has been reading food labels. Reviewed the importance of keeping daily sodium intake to 2000mg  daily     Heart Failure Action Plan A heart failure action plan helps you understand what to do when you have symptoms of heart failure. Follow the plan that was created by you and your health care provider. Review your plan each time you visit your health care provider. Red zone These signs and symptoms mean you should get medical help right away:  You have trouble breathing when resting.  You have a dry cough that is getting worse.  You have swelling or pain in your legs or abdomen that is getting worse.  You suddenly gain more than 2-3 lb (0.9-1.4 kg) in a day, or more than 5 lb (2.3 kg) in one week. This amount may be more or less depending on your condition.  You have trouble staying awake or you feel confused.  You have chest pain.  You do not have an appetite.  You pass out. If you experience any of these symptoms:  Call your local emergency services (911 in the [place] right away or seek help at the emergency department of the nearest hospital. Yellow zone These signs and symptoms mean your condition may be getting worse and you should make some changes:  You have trouble breathing when you are active or you need to sleep with extra pillows.  You have swelling in your legs or abdomen.  You gain 2-3 lb (0.9-1.4 kg) in one day, or 5 lb (2.3 kg) in one week. This amount may be more or less depending on your condition.  You get tired easily.  You have trouble sleeping.  You have a dry cough. If you experience any of these symptoms:  Contact your health care provider within the next day.  Your health care provider may adjust your medicines. Green zone These signs mean you are doing well and can continue what you are doing:  You do not have  shortness of breath.  You have very little swelling or no new swelling.  Your weight is stable (no gain or loss).  You have a normal activity level.  You do not have chest pain or any other new symptoms. Follow these instructions at home:  Take over-the-counter and prescription medicines only as told by your health care provider.  Weigh yourself daily. Your target weight is __________ lb (__________ kg). ? Call your health care provider if you gain more than __________ lb (__________ kg) in a day, or more than __________ lb (__________ kg) in one week.  Eat a heart-healthy diet. Work with a diet and nutrition specialist (dietitian) to create an eating plan that is best for you.  Keep all follow-up visits as told by your health care provider. This is important. Where to find more information  American Heart Association: www.heart.org Summary  Follow the action plan that was created by you and your health care provider.  Get help right away if you have any symptoms in the Red zone. This information is not intended to replace advice given to you by your health care provider. Make sure you discuss any questions you have with your health care provider. Document Revised: 11/13/2017 Document Reviewed: 01/10/2017 Elsevier Patient Education  2020 [employer].

## 2020-01-11 NOTE — Chronic Care Management (AMB) (Signed)
Chronic Care Management   Follow Up Note   01/11/2020 Name: Tonya [name] MRN: [id number] DOB: [birth year]  Referred by: [name], NP Reason for referral : Chronic Care Management (Medication Management)   Tonya [name] is a 74 y.o. year old female who is a primary care patient of Cannady, [name], NP. The CCM team was consulted for assistance with chronic disease management and care coordination needs.    Met with patient face to face at primary care provider appointment today.   Review of patient status, including review of consultants reports, relevant laboratory and other test results, and collaboration with appropriate care team members and the patient's provider was performed as part of comprehensive patient evaluation and provision of chronic care management services.    SDOH (Social Determinants of Health) screening performed today: Financial Strain . See Care Plan for related entries.   Outpatient Encounter Medications as of 01/11/2020  Medication Sig Note  . aspirin 81 MG tablet Take 81 mg by mouth daily.   . carvedilol (COREG) 3.125 MG tablet Take 1 tablet (3.125 mg total) by mouth 2 (two) times daily with a meal.   . clopidogrel (PLAVIX) 75 MG tablet TAKE 1 TABLET BY MOUTH  DAILY   . ferrous sulfate 325 (65 FE) MG EC tablet Take 1 tablet (325 mg total) by mouth 3 (three) times daily with meals. 01/11/2020: Taking twice daily   . furosemide (LASIX) 20 MG tablet Take 1 tablet (20 mg total) by mouth daily.   . furosemide (LASIX) 40 MG tablet TAKE 1 TABLET BY MOUTH  DAILY   . HUMALOG KWIKPEN 100 UNIT/ML KiwkPen TK 12 UNITS BEFORE LUNCH AND 8 UNITS BEFORE SUPPER.   [name] insulin detemir (LEVEMIR) 100 UNIT/ML injection Inject 12 Units into the skin every morning.  01/11/2020: 8 units   . omeprazole (PRILOSEC) 20 MG capsule TAKE 1 CAPSULE BY MOUTH  TWICE DAILY   . pioglitazone (ACTOS) 30 MG tablet Take 30 mg by mouth daily.   . rosuvastatin (CRESTOR) 5 MG tablet Take 1  tablet (5 mg total) by mouth daily.   . Vitamin D, Cholecalciferol, 50 MCG (2000 UT) CAPS Take 2,000 Units by mouth daily.   [name] DOK 100 MG capsule TAKE ONE CAPSULE BY MOUTH EVERY DAY. DO NOT TAKE IF YOU HAVE LOOSE STOOLS (Patient not taking: Reported on 01/11/2020)   . pregabalin (LYRICA) 25 MG capsule Take 1 capsule (25 mg total) by mouth at bedtime. (Patient not taking: Reported on 01/09/2020)    No facility-administered encounter medications on file as of 01/11/2020.     Objective:   Goals Addressed            This Visit's Progress     Patient Stated   . "I need help managing my blood sugars" (pt-stated)       Current Barriers:  . Diabetes: uncontrolled but improved, most recent A1c 8.6%  o Follows with Dr. [name] at [facility]; had [place] sensor placed for 2 weeks, goes back for follow up on 01/18/20 . Current antihyperglycemic regimen: pioglitazone 30 mg daily, Levemir 8 units daily, Humalog sliding scale - if <100 no Humalog; 100-200 usually 8 units, 10 units if >200 o Cardiology recommended d/c of pioglitazone given HF . Current blood glucose readings:  o Fastings: 50-130s o Before eating: 100s-190s . Cardiovascular risk reduction: o Current hypertensive regimen: carvedilol 3.125 mg BID; furosemide 40 mg QAM- though prescribed 20 mg o Current hyperlipidemia regimen: rosuvastatin 5 mg daily;  last LDL at goal <100  Pharmacist Clinical Goal(s):  [name] Over the next 90 days, patient with work with PharmD and primary care provider to address optimized medication management  Interventions: . Comprehensive medication review performed, medication list updated in electronic medical record . Provided patient written information on how to fill pill box, including what to communicate w/ Dr. [name] about the pioglitazone and cardiology's recommendation . Reviewed goal blood sugar readings, and risks of hypoglycemia.  . Discussed referral to pain management for difficult to manage shingles  pain . Reviewed need to weigh daily. Patient may be able to order a scale for free through [employer]. Counseled her on this . Patient would also benefit from more extensive HF and DM management education. Will collaborate w/ RN CM on this  Patient Self Care Activities:  . Patient will check blood glucose daily, document, and provide at future appointments . Patient will focus on medication adherence by using her pill box . Patient will contact provider with any episodes of hypoglycemia . Patient will report any questions or concerns to provider   Please see past updates related to this goal by clicking on the "Past Updates" button in the selected goal          Plan:  - Scheduled f/u call 02/10/20  [name], PharmD, [place] [phone]

## 2020-01-11 NOTE — Assessment & Plan Note (Signed)
Chronic with poor tolerance to Gabapentin and Lyrica and minimal relief with simple treatment methods.  Referral to pain management placed.

## 2020-01-11 NOTE — Assessment & Plan Note (Addendum)
Chronic, ongoing with recent EF 60-65%.  Cardiology has recommended discontinuation of Actos and sent recommendation to endocrinology.  This would be beneficial change with her underlying HF.  Continue current heart medication regimen and collaboration with cardiology + CCM team in office.  Has upcoming endocrinology visit in February. - Reminded to call for an overnight weight gain of >2 pounds or a weekly weight weight of >5 pounds - not adding salt to his food and has been reading food labels. Reviewed the importance of keeping daily sodium intake to 2000mg  daily

## 2020-01-11 NOTE — Assessment & Plan Note (Addendum)
Chronic, ongoing.  Continue current medication regimen and collaboration with nephrology.  CMP next visit.  Will await their guidance on whether to restart Benicar.

## 2020-01-11 NOTE — Patient Instructions (Addendum)
This is how I would fill the pill box:   Morning: - Furosemide 20 mg *(cut the 40 mg tablets in half)* - Ferrous sulfate (iron) - Carvedilol 3.125 mg - Clopidogrel 75 mg  - Aspirin 81 mg  - Omeprazole 20 mg (acid reflux)  Evening: - Carvedilol 3.125 mg  - Ferrous sulfate (iron) - Omeprazole 20 mg (acid reflux) - Rosuvastatin 5 mg   You told me that you are taking Levemir 8 units every evening, because taking more caused even worse episodes of low blood sugar. You told me that you take Humalog this way: 0 units if blood sugar is less than 100, 8 units if blood sugar between 100-200, and 10 units if greater than 200.   The cardiologist would like you to talk to Dr. [name] about stopping pioglitazone due to your heart failure.   Make sure you are scanning your [place] sensor at least every 6 hours, so that Dr. [name] gets all of the data she needs.   Please call me if you have any questions about your medications!  Visit Information  Goals Addressed            This Visit's Progress     Patient Stated   . "I need help managing my blood sugars" (pt-stated)       Current Barriers:  . Diabetes: uncontrolled but improved, most recent A1c 8.6%  o Follows with Dr. [name] at [facility]; had [place] sensor placed for 2 weeks, goes back for follow up on 01/18/20 . Current antihyperglycemic regimen: pioglitazone 30 mg daily, Levemir 8 units daily, Humalog sliding scale - if <100 no Humalog; 100-200 usually 8 units, 10 units if >200 o Cardiology recommended d/c of pioglitazone given HF . Current blood glucose readings:  o Fastings: 50-130s o Before eating: 100s-190s . Cardiovascular risk reduction: o Current hypertensive regimen: carvedilol 3.125 mg BID; furosemide 40 mg QAM- though prescribed 20 mg o Current hyperlipidemia regimen: rosuvastatin 5 mg daily; last LDL at goal <100  Pharmacist Clinical Goal(s):  [name] Over the next 90 days, patient with work with PharmD and primary care  provider to address optimized medication management  Interventions: . Comprehensive medication review performed, medication list updated in electronic medical record . Provided patient written information on how to fill pill box, including what to communicate w/ Dr. [name] about the pioglitazone and cardiology's recommendation . Reviewed goal blood sugar readings, and risks of hypoglycemia.  . Discussed referral to pain management for difficult to manage shingles pain . Reviewed need to weigh daily. Patient may be able to order a scale for free through [employer]. Counseled her on this . Patient would also benefit from more extensive HF and DM management education. Will collaborate w/ RN CM on this  Patient Self Care Activities:  . Patient will check blood glucose daily, document, and provide at future appointments . Patient will focus on medication adherence by using her pill box . Patient will contact provider with any episodes of hypoglycemia . Patient will report any questions or concerns to provider   Please see past updates related to this goal by clicking on the "Past Updates" button in the selected goal         Print copy of patient instructions provided.   Plan:  - Scheduled f/u call 02/10/20  [name], PharmD, [place]  [phone]

## 2020-01-11 NOTE — Assessment & Plan Note (Signed)
Noted on echo, continue collaboration with cardiology.

## 2020-01-12 ENCOUNTER — Other Ambulatory Visit
Admission: RE | Admit: 2020-01-12 | Discharge: 2020-01-12 | Disposition: A | Discharge status: Home / Self Care | Payer: Medicare Other | Source: Ambulatory Visit | Attending: Gastroenterology | Admitting: Gastroenterology

## 2020-01-12 DIAGNOSIS — Z20822 Contact with and (suspected) exposure to covid-19: Secondary | ICD-10-CM | POA: Insufficient documentation

## 2020-01-12 DIAGNOSIS — Z01812 Encounter for preprocedural laboratory examination: Secondary | ICD-10-CM | POA: Insufficient documentation

## 2020-01-12 LAB — SARS CORONAVIRUS 2 (TAT 6-24 HRS): SARS Coronavirus 2: NEGATIVE

## 2020-01-13 NOTE — Discharge Instructions (Signed)
General Anesthesia, Adult, Care After This sheet gives you information about how to care for yourself after your procedure. Your health care provider may also give you more specific instructions. If you have problems or questions, contact your health care provider. What can I expect after the procedure? After the procedure, the following side effects are common:  Pain or discomfort at the IV site.  Nausea.  Vomiting.  Sore throat.  Trouble concentrating.  Feeling cold or chills.  Weak or tired.  Sleepiness and fatigue.  Soreness and body aches. These side effects can affect parts of the body that were not involved in surgery. Follow these instructions at home:  For at least 24 hours after the procedure:  Have a responsible adult stay with you. It is important to have someone help care for you until you are awake and alert.  Rest as needed.  Do not: ? Participate in activities in which you could fall or become injured. ? Drive. ? Use heavy machinery. ? Drink alcohol. ? Take sleeping pills or medicines that cause drowsiness. ? Make important decisions or sign legal documents. ? Take care of children on your own. Eating and drinking  Follow any instructions from your health care provider about eating or drinking restrictions.  When you feel hungry, start by eating small amounts of foods that are soft and easy to digest (bland), such as toast. Gradually return to your regular diet.  Drink enough fluid to keep your urine pale yellow.  If you vomit, rehydrate by drinking water, juice, or clear broth. General instructions  If you have sleep apnea, surgery and certain medicines can increase your risk for breathing problems. Follow instructions from your health care provider about wearing your sleep device: ? Anytime you are sleeping, including during daytime naps. ? While taking prescription pain medicines, sleeping medicines, or medicines that make you drowsy.  Return to  your normal activities as told by your health care provider. Ask your health care provider what activities are safe for you.  Take over-the-counter and prescription medicines only as told by your health care provider.  If you smoke, do not smoke without supervision.  Keep all follow-up visits as told by your health care provider. This is important. Contact a health care provider if:  You have nausea or vomiting that does not get better with medicine.  You cannot eat or drink without vomiting.  You have pain that does not get better with medicine.  You are unable to pass urine.  You develop a skin rash.  You have a fever.  You have redness around your IV site that gets worse. Get help right away if:  You have difficulty breathing.  You have chest pain.  You have blood in your urine or stool, or you vomit blood. Summary  After the procedure, it is common to have a sore throat or nausea. It is also common to feel tired.  Have a responsible adult stay with you for the first 24 hours after general anesthesia. It is important to have someone help care for you until you are awake and alert.  When you feel hungry, start by eating small amounts of foods that are soft and easy to digest (bland), such as toast. Gradually return to your regular diet.  Drink enough fluid to keep your urine pale yellow.  Return to your normal activities as told by your health care provider. Ask your health care provider what activities are safe for you. This information is not   intended to replace advice given to you by your health care provider. Make sure you discuss any questions you have with your health care provider. Document Revised: 12/04/2017 Document Reviewed: 07/17/2017 Elsevier Patient Education  2020 [employer]  

## 2020-01-16 ENCOUNTER — Ambulatory Visit: Payer: Medicare Other | Admitting: Anesthesiology

## 2020-01-16 ENCOUNTER — Other Ambulatory Visit: Payer: Self-pay

## 2020-01-16 ENCOUNTER — Encounter
Admission: RE | Disposition: A | Discharge status: Home / Self Care | Payer: Self-pay | Source: Home / Self Care | Attending: Gastroenterology

## 2020-01-16 ENCOUNTER — Encounter: Payer: Self-pay | Admitting: Gastroenterology

## 2020-01-16 ENCOUNTER — Ambulatory Visit
Admission: RE | Admit: 2020-01-16 | Discharge: 2020-01-16 | Disposition: A | Discharge status: Home / Self Care | Payer: Medicare Other | Attending: Gastroenterology | Admitting: Gastroenterology

## 2020-01-16 DIAGNOSIS — Z1211 Encounter for screening for malignant neoplasm of colon: Secondary | ICD-10-CM | POA: Diagnosis present

## 2020-01-16 DIAGNOSIS — E1122 Type 2 diabetes mellitus with diabetic chronic kidney disease: Secondary | ICD-10-CM | POA: Diagnosis not present

## 2020-01-16 DIAGNOSIS — Z7982 Long term (current) use of aspirin: Secondary | ICD-10-CM | POA: Insufficient documentation

## 2020-01-16 DIAGNOSIS — Z794 Long term (current) use of insulin: Secondary | ICD-10-CM | POA: Insufficient documentation

## 2020-01-16 DIAGNOSIS — Z79899 Other long term (current) drug therapy: Secondary | ICD-10-CM | POA: Diagnosis not present

## 2020-01-16 DIAGNOSIS — Z888 Allergy status to other drugs, medicaments and biological substances status: Secondary | ICD-10-CM | POA: Diagnosis not present

## 2020-01-16 DIAGNOSIS — E785 Hyperlipidemia, unspecified: Secondary | ICD-10-CM | POA: Insufficient documentation

## 2020-01-16 DIAGNOSIS — Z7902 Long term (current) use of antithrombotics/antiplatelets: Secondary | ICD-10-CM | POA: Diagnosis not present

## 2020-01-16 DIAGNOSIS — I5032 Chronic diastolic (congestive) heart failure: Secondary | ICD-10-CM | POA: Diagnosis not present

## 2020-01-16 DIAGNOSIS — K653 Choleperitonitis: Secondary | ICD-10-CM

## 2020-01-16 DIAGNOSIS — I130 Hypertensive heart and chronic kidney disease with heart failure and stage 1 through stage 4 chronic kidney disease, or unspecified chronic kidney disease: Secondary | ICD-10-CM | POA: Insufficient documentation

## 2020-01-16 DIAGNOSIS — Z8673 Personal history of transient ischemic attack (TIA), and cerebral infarction without residual deficits: Secondary | ICD-10-CM | POA: Diagnosis not present

## 2020-01-16 DIAGNOSIS — K5730 Diverticulosis of large intestine without perforation or abscess without bleeding: Secondary | ICD-10-CM | POA: Insufficient documentation

## 2020-01-16 DIAGNOSIS — N183 Chronic kidney disease, stage 3 unspecified: Secondary | ICD-10-CM | POA: Diagnosis not present

## 2020-01-16 HISTORY — PX: COLONOSCOPY WITH PROPOFOL: SHX5780

## 2020-01-16 HISTORY — PX: POLYPECTOMY: SHX5525

## 2020-01-16 LAB — GLUCOSE, CAPILLARY
Glucose-Capillary: 141 mg/dL — ABNORMAL HIGH (ref 70–99)
Glucose-Capillary: 131 mg/dL — ABNORMAL HIGH (ref 70–99)

## 2020-01-16 SURGERY — COLONOSCOPY WITH PROPOFOL
Anesthesia: General | Site: Rectum

## 2020-01-16 MED ORDER — PROPOFOL 10 MG/ML IV BOLUS
INTRAVENOUS | Status: DC | PRN
  Administered 2020-01-16: 20 mg via INTRAVENOUS
  Administered 2020-01-16: 40 mg via INTRAVENOUS
  Administered 2020-01-16: 20 mg via INTRAVENOUS

## 2020-01-16 MED ORDER — LIDOCAINE HCL (CARDIAC) PF 100 MG/5ML IV SOSY
PREFILLED_SYRINGE | INTRAVENOUS | Status: DC | PRN
  Administered 2020-01-16: 30 mg via INTRAVENOUS

## 2020-01-16 MED ORDER — ACETAMINOPHEN 325 MG PO TABS: 325.0000 mg | ORAL_TABLET | Freq: Once | ORAL | Status: DC

## 2020-01-16 MED ORDER — ACETAMINOPHEN 160 MG/5ML PO SOLN: 325.0000 mg | Freq: Once | ORAL | Status: DC

## 2020-01-16 MED ORDER — STERILE WATER FOR IRRIGATION IR SOLN
Status: DC | PRN
  Administered 2020-01-16: 08:00:00 50 mL

## 2020-01-16 MED ORDER — LACTATED RINGERS IV SOLN
10.0000 mL/h | INTRAVENOUS | Status: DC
  Administered 2020-01-16: 08:00:00 10 mL/h via INTRAVENOUS

## 2020-01-16 SURGICAL SUPPLY — 6 items
CANISTER SUCT 1200ML W/VALVE (MISCELLANEOUS) ×3 IMPLANT
FORCEPS BIOP RAD 4 LRG CAP 4 (CUTTING FORCEPS) ×3 IMPLANT
GOWN CVR UNV OPN BCK APRN NK (MISCELLANEOUS) ×2 IMPLANT
GOWN ISOL THUMB LOOP REG UNIV (MISCELLANEOUS) ×4
KIT ENDO PROCEDURE OLY (KITS) ×3 IMPLANT
WATER STERILE IRR 250ML POUR (IV SOLUTION) ×3 IMPLANT

## 2020-01-16 NOTE — Anesthesia Preprocedure Evaluation (Signed)
Anesthesia Evaluation  Patient identified by MRN, date of birth, ID band Patient awake    Reviewed: Allergy & Precautions, H&P , NPO status , Patient's Chart, lab work & pertinent test results  Airway Mallampati: III  TM Distance: >3 FB Neck ROM: full    Dental no notable dental hx.    Pulmonary    Pulmonary exam normal breath sounds clear to auscultation       Cardiovascular hypertension, +CHF  Normal cardiovascular exam Rhythm:regular Rate:Normal     Neuro/Psych CVA    GI/Hepatic   Endo/Other  diabetes  Renal/GU Renal disease     Musculoskeletal   Abdominal   Peds  Hematology   Anesthesia Other Findings   Reproductive/Obstetrics                             Anesthesia Physical Anesthesia Plan  ASA: III  Anesthesia Plan: General   Post-op Pain Management:    Induction: Intravenous  PONV Risk Score and Plan: 3 and Treatment may vary due to age or medical condition, TIVA and Propofol infusion  Airway Management Planned: Natural Airway  Additional Equipment:   Intra-op Plan:   Post-operative Plan:   Informed Consent: I have reviewed the patients History and Physical, chart, labs and discussed the procedure including the risks, benefits and alternatives for the proposed anesthesia with the patient or authorized representative who has indicated his/her understanding and acceptance.     Dental Advisory Given  Plan Discussed with: CRNA  Anesthesia Plan Comments:         Anesthesia Quick Evaluation

## 2020-01-16 NOTE — Op Note (Signed)
[facility] Patient Name: Tonya [name] Procedure Date: 01/16/2020 7:27 AM MRN: [id number] Account #: [id number] Date of Birth: 1946-09-09 Admit Type: Outpatient Age: 74 Room: [facility] OR ROOM 01 Gender: Female Note Status: Finalized Procedure:             Colonoscopy Indications:           Screening for colorectal malignant neoplasm Providers:             [name] MD, MD Referring MD:          Guadalupe Maple, MD (Referring MD) Medicines:             Monitored Anesthesia Care Complications:         No immediate complications. Procedure:             Pre-Anesthesia Assessment:                        - Prior to the procedure, a History and Physical was                         performed, and patient medications, allergies and                         sensitivities were reviewed. The patient's tolerance                         of previous anesthesia was reviewed.                        - The risks and benefits of the procedure and the                         sedation options and risks were discussed with the                         patient. All questions were answered and informed                         consent was obtained.                        - ASA Grade Assessment: II - A patient with mild                         systemic disease.                        After obtaining informed consent, the colonoscope was                         passed under direct vision. Throughout the procedure,                         the patient's blood pressure, pulse, and oxygen                         saturations were monitored continuously. The was                         introduced through the anus and  advanced to the the                         cecum, identified by the appendiceal orifice. The                         colonoscopy was performed with ease. The patient                         tolerated the procedure well. The quality of the bowel   preparation was excellent. Findings:      The perianal and digital rectal examinations were normal.      A 3 mm polyp was found in the ascending colon. The polyp was sessile.       The polyp was removed with a cold biopsy forceps. Resection and       retrieval were complete.      Multiple small-mouthed diverticula were found in the left colon.      The exam was otherwise without abnormality on direct and retroflexion       views. Impression:            - One 3 mm polyp in the ascending colon, removed with                         a cold biopsy forceps. Resected and retrieved.                        - Diverticulosis in the left colon.                        - The examination was otherwise normal on direct and                         retroflexion views. Recommendation:        - Discharge patient to home (with escort).                        - Resume previous diet.                        - Continue present medications.                        - Await pathology results.                        - Repeat colonoscopy for surveillance based on                         pathology results. Procedure Code(s):     --- Professional ---                        [phone], Colonoscopy, flexible; with biopsy, single or                         multiple Diagnosis Code(s):     --- Professional ---                        Z12.11, Encounter for screening for malignant neoplasm  of colon                        K63.5, Polyp of colon                        K57.30, Diverticulosis of large intestine without                         perforation or abscess without bleeding CPT copyright 2019 American Medical Association. All rights reserved. The codes documented in this report are preliminary and upon coder review may  be revised to meet current compliance requirements. [name], MD [name] MD, MD 01/16/2020 8:00:50 AM This report has been signed electronically. Number of Addenda: 0 Note  Initiated On: 01/16/2020 7:27 AM Scope Withdrawal Time: 0 hours 11 minutes 52 seconds  Total Procedure Duration: 0 hours 15 minutes 0 seconds  Estimated Blood Loss:  Estimated blood loss: none.      [facility]

## 2020-01-16 NOTE — Transfer of Care (Signed)
Immediate Anesthesia Transfer of Care Note  Patient: Tonya [name]  Procedure(s) Performed: COLONOSCOPY WITH BIOPSY (N/A Rectum) POLYPECTOMY (N/A Rectum)  Patient Location: PACU  Anesthesia Type: General  Level of Consciousness: awake, alert  and patient cooperative  Airway and Oxygen Therapy: Patient Spontanous Breathing and Patient connected to supplemental oxygen  Post-op Assessment: Post-op Vital signs reviewed, Patient's Cardiovascular Status Stable, Respiratory Function Stable, Patent Airway and No signs of Nausea or vomiting  Post-op Vital Signs: Reviewed and stable  Complications: No apparent anesthesia complications

## 2020-01-16 NOTE — Anesthesia Procedure Notes (Signed)
Procedure Name: General with mask airway Performed by: Izetta Dakin, CRNA Pre-anesthesia Checklist: Patient identified, Emergency Drugs available, Suction available, Patient being monitored and Timeout performed Patient Re-evaluated:Patient Re-evaluated prior to induction Oxygen Delivery Method: Nasal cannula

## 2020-01-16 NOTE — H&P (Signed)
[name], MD [address], [place], [place], [place], [id number], [place], [place], [place], [address] Phone: [phone]  Fax: [phone]  Primary Care Physician:  [name], NP   Pre-Procedure History & Physical: HPI:  Tonya [name] is a 74 y.o. female is here for an colonoscopy.   Past Medical History:  Diagnosis Date  . (HFpEF) heart failure with preserved ejection fraction ([place])    a. 05/2017 Echo: EF 55-60%, Gr1 DD, mildly dil LA.  . CKD (chronic kidney disease), stage III   . Diabetes mellitus without complication ([place])   . Hyperlipidemia   . Hypertension   . Stroke [facility])    a. 08/2017.    Past Surgical History:  Procedure Laterality Date  . COLONOSCOPY  12/15/2006  . COLONOSCOPY WITH PROPOFOL N/A 06/06/2019   Procedure: COLONOSCOPY WITH PROPOFOL;  Surgeon: [name], MD;  Location: [facility] ENDOSCOPY;  Service: Gastroenterology;  Laterality: N/A;  . TUBAL LIGATION      Prior to Admission medications   Medication Sig Start Date End Date Taking? Authorizing Provider  aspirin 81 MG tablet Take 81 mg by mouth daily.   Yes [provider]  carvedilol (COREG) 3.125 MG tablet Take 1 tablet (3.125 mg total) by mouth 2 (two) times daily with a meal. 03/29/19  Yes Gollan, Kathlene November, MD  clopidogrel (PLAVIX) 75 MG tablet TAKE 1 TABLET BY MOUTH  DAILY 08/09/19  Yes Cannady, [name] T, NP  ferrous sulfate 325 (65 FE) MG EC tablet Take 1 tablet (325 mg total) by mouth 3 (three) times daily with meals. 09/27/19  Yes Earlie Server, MD  furosemide (LASIX) 40 MG tablet TAKE 1 TABLET BY MOUTH  DAILY 01/06/20  Yes Cannady, [name] T, NP  HUMALOG KWIKPEN 100 UNIT/ML KiwkPen TK 12 UNITS BEFORE LUNCH AND 8 UNITS BEFORE SUPPER. 05/20/18  Yes [provider]  insulin detemir (LEVEMIR) 100 UNIT/ML injection Inject 12 Units into the skin every morning.    Yes [provider]  omeprazole (PRILOSEC) 20 MG capsule TAKE 1 CAPSULE BY MOUTH  TWICE DAILY  08/09/19  Yes Cannady, [name] T, NP  pioglitazone (ACTOS) 30 MG tablet Take 30 mg by mouth daily. 10/11/19  Yes [provider]  rosuvastatin (CRESTOR) 5 MG tablet Take 1 tablet (5 mg total) by mouth daily. 03/29/19  Yes [name], MD  Vitamin D, Cholecalciferol, 50 MCG (2000 UT) CAPS Take 2,000 Units by mouth daily.   Yes [provider]  DOK 100 MG capsule TAKE ONE CAPSULE BY MOUTH EVERY DAY. DO NOT TAKE IF YOU HAVE LOOSE STOOLS Patient not taking: Reported on 01/16/2020 07/25/19   Earlie Server, MD  furosemide (LASIX) 20 MG tablet Take 1 tablet (20 mg total) by mouth daily. 12/03/19   Lavina Hamman, MD  pregabalin (LYRICA) 25 MG capsule Take 1 capsule (25 mg total) by mouth at bedtime. Patient not taking: Reported on 01/09/2020 12/03/19   Lavina Hamman, MD    Allergies as of 12/28/2019 - Review Complete 12/27/2019  Allergen Reaction Noted  . Atorvastatin  09/03/2017  . Gabapentin Other (See Comments) 06/30/2018  . Pravastatin  09/03/2017  . Trulicity [dulaglutide] Hives 05/01/2015    Family History  Problem Relation Age of Onset  . Diabetes Mother   . Stroke Mother   . Hyperlipidemia Father   . Hypertension Father   . Diabetes Sister   . Diabetes Brother   . Gout Son   .  Diabetes Brother   . Kidney disease Son   . Lymphoma Sister   . Heart attack Sister   . Breast cancer Neg Hx     Social History   Socioeconomic History  . Marital status: Married    Spouse name: Not on file  . Number of children: Not on file  . Years of education: Not on file  . Highest education level: GED or equivalent  Occupational History  . Occupation: retired  Tobacco Use  . Smoking status: Never Smoker  . Smokeless tobacco: Never Used  Substance and Sexual Activity  . Alcohol use: No    Alcohol/week: 0.0 standard drinks  . Drug use: No  . Sexual activity: Yes  Other Topics Concern  . Not on file  Social History Narrative  . Not on file   Social Determinants of  Health   Financial Resource Strain:   . Difficulty of Paying Living Expenses: Not on file  Food Insecurity:   . Worried About Charity fundraiser in the Last Year: Not on file  . Ran Out of Food in the Last Year: Not on file  Transportation Needs:   . Lack of Transportation (Medical): Not on file  . Lack of Transportation (Non-Medical): Not on file  Physical Activity:   . Days of Exercise per Week: Not on file  . Minutes of Exercise per Session: Not on file  Stress:   . Feeling of Stress : Not on file  Social Connections:   . Frequency of Communication with Friends and Family: Not on file  . Frequency of Social Gatherings with Friends and Family: Not on file  . Attends Religious Services: Not on file  . Active Member of Clubs or Organizations: Not on file  . Attends Archivist Meetings: Not on file  . Marital Status: Not on file  Intimate Partner Violence:   . Fear of Current or Ex-Partner: Not on file  . Emotionally Abused: Not on file  . Physically Abused: Not on file  . Sexually Abused: Not on file    Review of Systems: See HPI, otherwise negative ROS  Physical Exam: BP (!) 173/81   Pulse 82   Temp (!) 97.5 F (36.4 C) (Temporal)   Ht 5\' 7"  (1.702 m)   Wt 80.3 kg   LMP  (LMP Unknown)   SpO2 100%   BMI 27.72 kg/m  General:   Alert,  pleasant and cooperative in NAD Head:  Normocephalic and atraumatic. Neck:  Supple; no masses or thyromegaly. Lungs:  Clear throughout to auscultation, normal respiratory effort.    Heart:  +S1, +S2, Regular rate and rhythm, No edema. Abdomen:  Soft, nontender and nondistended. Normal bowel sounds, without guarding, and without rebound.   Neurologic:  Alert and  oriented x4;  grossly normal neurologically.  Impression/Plan: DESEREE ZEMAITIS is here for an colonoscopy to be performed for Screening colonoscopy average risk   Risks, benefits, limitations, and alternatives regarding  colonoscopy have been reviewed with the  patient.  Questions have been answered.  All parties agreeable.   [name], MD  01/16/2020, 7:26 AM

## 2020-01-16 NOTE — Anesthesia Postprocedure Evaluation (Signed)
Anesthesia Post Note  Patient: Tonya [name]  Procedure(s) Performed: COLONOSCOPY WITH BIOPSY (N/A Rectum) POLYPECTOMY (N/A Rectum)     Patient location during evaluation: PACU Anesthesia Type: General Level of consciousness: awake and alert and oriented Pain management: satisfactory to patient Vital Signs Assessment: post-procedure vital signs reviewed and stable Respiratory status: spontaneous breathing, nonlabored ventilation and respiratory function stable Cardiovascular status: blood pressure returned to baseline and stable Postop Assessment: Adequate PO intake and No signs of nausea or vomiting Anesthetic complications: no    Raliegh Ip

## 2020-01-17 ENCOUNTER — Encounter: Payer: Self-pay | Admitting: *Deleted

## 2020-01-18 LAB — SURGICAL PATHOLOGY

## 2020-02-01 ENCOUNTER — Telehealth: Payer: Self-pay | Admitting: General Practice

## 2020-02-01 ENCOUNTER — Ambulatory Visit (INDEPENDENT_AMBULATORY_CARE_PROVIDER_SITE_OTHER): Payer: Medicare Other | Admitting: General Practice

## 2020-02-01 DIAGNOSIS — E1140 Type 2 diabetes mellitus with diabetic neuropathy, unspecified: Secondary | ICD-10-CM

## 2020-02-01 DIAGNOSIS — E785 Hyperlipidemia, unspecified: Secondary | ICD-10-CM | POA: Diagnosis not present

## 2020-02-01 DIAGNOSIS — I5032 Chronic diastolic (congestive) heart failure: Secondary | ICD-10-CM | POA: Diagnosis not present

## 2020-02-01 DIAGNOSIS — E1165 Type 2 diabetes mellitus with hyperglycemia: Secondary | ICD-10-CM

## 2020-02-01 DIAGNOSIS — E1169 Type 2 diabetes mellitus with other specified complication: Secondary | ICD-10-CM

## 2020-02-01 DIAGNOSIS — I10 Essential (primary) hypertension: Secondary | ICD-10-CM

## 2020-02-01 DIAGNOSIS — N184 Chronic kidney disease, stage 4 (severe): Secondary | ICD-10-CM

## 2020-02-01 NOTE — Patient Instructions (Signed)
Visit Information  Goals Addressed            This Visit's Progress    RNCM: I have a lot of health problems but I am doing okay       Current Barriers:   Chronic Disease Management support, education, and care coordination needs related to CHF, HTN, HLD, DMII, and CKD Stage 4  Clinical Goal(s) related to CHF, HTN, HLD, DMII, and CKD Stage 4 :  Over the next 90 days, patient will:   Work with the care management team to address educational, disease management, and care coordination needs   Begin or continue self health monitoring activities as directed today Measure and record cbg (blood glucose) bid times daily, Measure and record blood pressure 5 times per week, and Measure and record weight daily- currently does not have a scale  Call provider office for new or worsened signs and symptoms Blood glucose findings outside established parameters, Blood pressure findings outside established parameters, Weight outside established parameters, Shortness of breath, and New or worsened symptom related to CKD 4  Call care management team with questions or concerns  Verbalize basic understanding of patient centered plan of care established today  Interventions related to CHF, HTN, HLD, DMII, and CKD Stage 4 :   Evaluation of current treatment plans and patient's adherence to plan as established by provider  Assessed patient understanding of disease states  Assessed patient's education and care coordination needs  Provided disease specific education to patient   Collaborated with appropriate clinical care team members regarding patient needs  Assessed the patients ability to weigh daily and the patient verbalized she does not have a scale, the RNCM will utilize resources to get the patient an operational scale   Assessed the patient current dietary habits and reviewed heart healthy/ADA diet options  Educated on the importance of elevating feet and legs when sitting and wearing ted  hose or socks (patient currently had compression socks on at the time of the call). The patient verbalized she does have issues with swelling in her feet and legs    Patient Self Care Activities related to CHF, HTN, HLD, DMII, and CKD Stage 4 :   Patient is unable to independently self-manage chronic health conditions  Unable to weigh daily due to not having a scale  Initial goal documentation        Patient verbalizes understanding of instructions provided today.   The care management team will reach out to the patient again over the next 60 days.   [name] RN, MSN, [facility] Mobile: [phone]

## 2020-02-01 NOTE — Chronic Care Management (AMB) (Signed)
Chronic Care Management   Initial Visit Note  02/01/2020 Name: Tonya [name] MRN: [id number] DOB: 1945/12/25  Referred by: Tonya Lick, NP Reason for referral : Chronic Care Management (Chronic Disease Management and Care Coordination needs: HTN/DM/CKD3/HF)   Tonya [name] is a 74 y.o. year old female who is a primary care patient of Cannady, [name], NP. The CCM team was consulted for assistance with chronic disease management and care coordination needs related to CHF, HTN, HLD, DMII and CKD Stage 4  Review of patient status, including review of consultants reports, relevant laboratory and other test results, and collaboration with appropriate care team members and the patient's provider was performed as part of comprehensive patient evaluation and provision of chronic care management services.    SDOH (Social Determinants of Health) screening performed today: Biomedical engineer  Food Insecurity  Depression   Alcohol/Substance Use Tobacco Use Stress Physical Activity. See Care Plan for related entries.   Medications: Outpatient Encounter Medications as of 02/01/2020  Medication Sig Note  . aspirin 81 MG tablet Take 81 mg by mouth daily.   . carvedilol (COREG) 3.125 MG tablet Take 1 tablet (3.125 mg total) by mouth 2 (two) times daily with a meal.   . clopidogrel (PLAVIX) 75 MG tablet TAKE 1 TABLET BY MOUTH  DAILY   . DOK 100 MG capsule TAKE ONE CAPSULE BY MOUTH EVERY DAY. DO NOT TAKE IF YOU HAVE LOOSE STOOLS (Patient not taking: Reported on 01/16/2020)   . ferrous sulfate 325 (65 FE) MG EC tablet Take 1 tablet (325 mg total) by mouth 3 (three) times daily with meals. 01/11/2020: Taking twice daily   . furosemide (LASIX) 20 MG tablet Take 1 tablet (20 mg total) by mouth daily.   . furosemide (LASIX) 40 MG tablet TAKE 1 TABLET BY MOUTH  DAILY   . HUMALOG KWIKPEN 100 UNIT/ML KiwkPen TK 12 UNITS BEFORE LUNCH AND 8 UNITS BEFORE SUPPER.   [name] insulin detemir  (LEVEMIR) 100 UNIT/ML injection Inject 12 Units into the skin every morning.  01/11/2020: 8 units   . omeprazole (PRILOSEC) 20 MG capsule TAKE 1 CAPSULE BY MOUTH  TWICE DAILY   . pioglitazone (ACTOS) 30 MG tablet Take 30 mg by mouth daily.   . pregabalin (LYRICA) 25 MG capsule Take 1 capsule (25 mg total) by mouth at bedtime. (Patient not taking: Reported on 01/09/2020)   . rosuvastatin (CRESTOR) 5 MG tablet Take 1 tablet (5 mg total) by mouth daily.   . Vitamin D, Cholecalciferol, 50 MCG (2000 UT) CAPS Take 2,000 Units by mouth daily.    No facility-administered encounter medications on file as of 02/01/2020.     Objective:  BP Readings from Last 3 Encounters:  01/16/20 (!) 150/91  01/11/20 136/88  01/05/20 140/84   Lab Results  Component Value Date   HGBA1C 9.8 (H) 12/02/2019   Lab Results  Component Value Date   CHOL 159 12/02/2019   HDL 64 12/02/2019   LDLCALC 80 12/02/2019   LDLDIRECT 67 12/30/2018   TRIG 75 12/02/2019   CHOLHDL 2.5 12/02/2019   Wt Readings from Last 3 Encounters:  01/16/20 177 lb (80.3 kg)  01/11/20 178 lb (80.7 kg)  01/05/20 180 lb (81.6 kg)    Goals Addressed            This Visit's Progress   . RNCM: I have a lot of health problems but I am doing okay       Current  Barriers:  . Chronic Disease Management support, education, and care coordination needs related to CHF, HTN, HLD, DMII, and CKD Stage 4  Clinical Goal(s) related to CHF, HTN, HLD, DMII, and CKD Stage 4 :  Over the next 90 days, patient will:  . Work with the care management team to address educational, disease management, and care coordination needs  . Begin or continue self health monitoring activities as directed today Measure and record cbg (blood glucose) bid times daily, Measure and record blood pressure 5 times per week, and Measure and record weight daily- currently does not have a scale . Call provider office for new or worsened signs and symptoms Blood glucose findings  outside established parameters, Blood pressure findings outside established parameters, Weight outside established parameters, Shortness of breath, and New or worsened symptom related to CKD 4 . Call care management team with questions or concerns . Verbalize basic understanding of patient centered plan of care established today  Interventions related to CHF, HTN, HLD, DMII, and CKD Stage 4 :  . Evaluation of current treatment plans and patient's adherence to plan as established by provider . Assessed patient understanding of disease states . Assessed patient's education and care coordination needs . Provided disease specific education to patient  . Collaborated with appropriate clinical care team members regarding patient needs . Assessed the patients ability to weigh daily and the patient verbalized she does not have a scale, the RNCM will utilize resources to get the patient an operational scale  . Assessed the patient current dietary habits and reviewed heart healthy/ADA diet options . Educated on the importance of elevating feet and legs when sitting and wearing ted hose or socks (patient currently had compression socks on at the time of the call). The patient verbalized she does have issues with swelling in her feet and legs    Patient Self Care Activities related to CHF, HTN, HLD, DMII, and CKD Stage 4 :  . Patient is unable to independently self-manage chronic health conditions . Unable to weigh daily due to not having a scale  Initial goal documentation         [name] was given information about Chronic Care Management services today including:  1. CCM service includes personalized support from designated clinical staff supervised by her physician, including individualized plan of care and coordination with other care providers 2. 24/7 contact phone numbers for assistance for urgent and routine care needs. 3. Service will only be billed when office clinical staff spend 20  minutes or more in a month to coordinate care. 4. Only one practitioner may furnish and bill the service in a calendar month. 5. The patient may stop CCM services at any time (effective at the end of the month) by phone call to the office staff. 6. The patient will be responsible for cost sharing (co-pay) of up to 20% of the service fee (after annual deductible is met).  Patient agreed to services and verbal consent obtained.   Plan:   The care management team will reach out to the patient again over the next 60 days.   [name] RN, MSN, [facility] Mobile: [phone]

## 2020-02-05 ENCOUNTER — Encounter: Payer: Self-pay | Admitting: Gastroenterology

## 2020-02-10 ENCOUNTER — Telehealth: Payer: Self-pay

## 2020-03-06 ENCOUNTER — Telehealth: Payer: Self-pay

## 2020-03-06 ENCOUNTER — Ambulatory Visit: Payer: Medicare Other | Admitting: Student in an Organized Health Care Education/Training Program

## 2020-03-07 ENCOUNTER — Ambulatory Visit
Payer: Medicare Other | Attending: Student in an Organized Health Care Education/Training Program | Admitting: Student in an Organized Health Care Education/Training Program

## 2020-03-07 ENCOUNTER — Encounter: Payer: Self-pay | Admitting: Student in an Organized Health Care Education/Training Program

## 2020-03-07 ENCOUNTER — Other Ambulatory Visit: Payer: Self-pay

## 2020-03-07 VITALS — BP 145/89 | HR 97 | Temp 98.3°F | Resp 18 | Ht 67.0 in | Wt 177.0 lb

## 2020-03-07 DIAGNOSIS — B0229 Other postherpetic nervous system involvement: Secondary | ICD-10-CM | POA: Diagnosis not present

## 2020-03-07 DIAGNOSIS — E1140 Type 2 diabetes mellitus with diabetic neuropathy, unspecified: Secondary | ICD-10-CM

## 2020-03-07 DIAGNOSIS — R8290 Unspecified abnormal findings in urine: Secondary | ICD-10-CM | POA: Diagnosis not present

## 2020-03-07 DIAGNOSIS — I639 Cerebral infarction, unspecified: Secondary | ICD-10-CM

## 2020-03-07 DIAGNOSIS — G894 Chronic pain syndrome: Secondary | ICD-10-CM | POA: Diagnosis not present

## 2020-03-07 DIAGNOSIS — N184 Chronic kidney disease, stage 4 (severe): Secondary | ICD-10-CM

## 2020-03-07 DIAGNOSIS — N2581 Secondary hyperparathyroidism of renal origin: Secondary | ICD-10-CM | POA: Diagnosis not present

## 2020-03-07 DIAGNOSIS — M792 Neuralgia and neuritis, unspecified: Secondary | ICD-10-CM

## 2020-03-07 DIAGNOSIS — D631 Anemia in chronic kidney disease: Secondary | ICD-10-CM | POA: Diagnosis not present

## 2020-03-07 DIAGNOSIS — E1165 Type 2 diabetes mellitus with hyperglycemia: Secondary | ICD-10-CM

## 2020-03-07 MED ORDER — DULOXETINE HCL 20 MG PO CPEP: 20.0000 mg | ORAL_CAPSULE | Freq: Every day | ORAL | 1 refills | Status: DC

## 2020-03-07 MED ORDER — CAPSAICIN 0.05 % EX GEL: 1.0000 [IU] | Freq: Four times a day (QID) | CUTANEOUS | 1 refills | Status: DC | PRN

## 2020-03-07 NOTE — Progress Notes (Signed)
Patient: Tonya [name]  Service Category: E/M  Provider: Gillis Santa, MD  DOB: 09/17/1946  DOS: 03/07/2020  Referring Provider: [name], NP  MRN: [id number]  Setting: Ambulatory outpatient  PCP: Tonya Lick, NP  Type: New Patient  Specialty: Interventional Pain Management    Location: Office  Delivery: Face-to-face     Primary Reason(s) for Visit: Encounter for initial evaluation of one or more chronic problems (new to examiner) potentially causing chronic pain, and posing a threat to normal musculoskeletal function. (Level of risk: High) CC: Back Pain (low right)  HPI  [name] is a 74 y.o. year old, female patient, who comes today to see [place] for the first time for an initial evaluation of her chronic pain. 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 Today she comes in for evaluation of her Back Pain (low right)  Pain Assessment: Location: Lower, Right Back Radiating: denies Onset: More than a month ago Duration: Chronic pain Quality: Tender(Just hurts) Severity: 9 /10 (subjective, self-reported pain score)  Note: Reported level is compatible  with observation.                         When using our objective Pain Scale, levels between 6 and 10/10 are said to belong in an emergency room, as it progressively worsens from a 6/10, described as severely limiting, requiring emergency care not usually available at an outpatient pain management facility. At a 6/10 level, communication becomes difficult and requires great effort. Assistance to reach the emergency department may be required. Facial flushing and profuse sweating along with potentially dangerous increases in heart rate and blood pressure will be evident. Effect on ADL:   Timing: Constant Modifying factors: Nothing BP: (!) 145/89  HR: 97  Onset and Duration: Gradual and Date of onset: approx 7 years Cause of pain: PHN Severity: No change since onset Timing: Not influenced by the time of the day Aggravating Factors: Kneeling, Lifiting and Prolonged sitting Alleviating Factors: Medications ALEVE Associated Problems: Pain that wakes patient up and Pain that does not allow patient to sleep Quality of Pain: Burning and Tender Previous Examinations or Tests: The patient denies any. Previous Treatments: The patient denies any.   The patient comes into the clinics today for the first time for a chronic pain management evaluation.   Patient is a pleasant 74 year old female who presents with chief complaint of right low back pain, burning and itching secondary to postherpetic neuralgia from shingles outbreak in 2017.  Of note patient had a stroke in 2015 for which she is on aspirin and Plavix.  She has tried Lyrica and gabapentin but was unable to tolerate these medications due to side effects of sedation and confusion.  In fact 1 of these episodes was so severe that she had  to come to the emergency department.  She has also tried lidocaine patches in the past which were not helpful.  She denies having tried other topical therapies.  She denies having tried Cymbalta.  She denies having  tried capsaicin cream.  She denies having tried TCAs.  Avoid opioid therapy given risk of sedation, dizziness, confusion.  Meds   Current Outpatient Medications:  .  aspirin 81 MG tablet, Take 81 mg by mouth daily., Disp: , Rfl:  .  Capsaicin 0.05 % GEL, Apply 1 Units topically 4 (four) times daily as needed., Disp: 60 g, Rfl: 1 .  carvedilol (COREG) 3.125 MG tablet, Take 1 tablet (3.125 mg total) by mouth 2 (two) times daily with a meal., Disp: 180 tablet, Rfl: 3 .  clopidogrel (PLAVIX) 75 MG tablet, TAKE 1 TABLET BY MOUTH  DAILY, Disp: 90 tablet, Rfl: 3 .  DULoxetine (CYMBALTA) 20 MG capsule, Take 1 capsule (20 mg total) by mouth daily., Disp: 30 capsule, Rfl: 1 .  ferrous sulfate 325 (65 FE) MG EC tablet, Take 1 tablet (325 mg total) by mouth 3 (three) times daily with meals., Disp: 120 tablet, Rfl: 3 .  furosemide (LASIX) 20 MG tablet, Take 1 tablet (20 mg total) by mouth daily., Disp: 30 tablet, Rfl: 0 .  HUMALOG KWIKPEN 100 UNIT/ML KiwkPen, TK 12 UNITS BEFORE LUNCH AND 8 UNITS BEFORE SUPPER., Disp: , Rfl: 11 .  insulin detemir (LEVEMIR) 100 UNIT/ML injection, Inject 12 Units into the skin every morning. , Disp: , Rfl:  .  omeprazole (PRILOSEC) 20 MG capsule, TAKE 1 CAPSULE BY MOUTH  TWICE DAILY, Disp: 180 capsule, Rfl: 3 .  pioglitazone (ACTOS) 30 MG tablet, Take 30 mg by mouth daily., Disp: , Rfl:  .  rosuvastatin (CRESTOR) 5 MG tablet, Take 1 tablet (5 mg total) by mouth daily., Disp: 90 tablet, Rfl: 3 .  Vitamin D, Cholecalciferol, 50 MCG (2000 UT) CAPS, Take 2,000 Units by mouth daily., Disp: , Rfl:   Imaging Review    Results for orders placed during the hospital encounter of 08/23/15  DG Lumbar Spine Complete   Narrative CLINICAL DATA:  Low back pain. No known injury. Initial evaluation .  EXAM: LUMBAR SPINE - COMPLETE 4+ VIEW  COMPARISON:  10/31/2012.  FINDINGS: Paraspinal structures are normal. Diffuse multilevel severe degenerative change noted. Mild scoliosis concave  left. Normal mineralization. No acute bony abnormality. Bilateral tubal ligations. Pelvic calcifications consistent phleboliths. Prominent amount of stool in the colon and rectum. Constipation cannot be excluded. Aortoiliac atherosclerotic vascular disease.  IMPRESSION: 1. Diffuse multilevel degenerative changes lumbar spine again noted. No acute bony abnormality. 2. Prominent amount of stool in the colon suggesting constipation. 3. Aortoiliac atherosclerotic vascular disease.   Electronically Signed   By: Marcello Moores  Register   On: 08/23/2015 16:10           Results for orders placed during the hospital encounter of 05/10/18  DG Hip Unilat W or Wo Pelvis 2-3 Views Right   Narrative CLINICAL DATA:  Acute onset of right hip pain.  EXAM: DG HIP (WITH OR WITHOUT PELVIS) 2-3V RIGHT  COMPARISON:  None.  FINDINGS: There is no evidence of fracture or dislocation. Both femoral heads are seated normally within their respective acetabula. The proximal right femur appears intact. Mild degenerative change is noted at the lower lumbar spine. The sacroiliac joints are unremarkable in appearance.  The visualized bowel gas pattern is grossly unremarkable in appearance. Scattered phleboliths are noted within the pelvis. Bilateral tubal  ligation clips are noted.  IMPRESSION: No evidence of fracture or dislocation.   Electronically Signed   By: Garald Balding M.D.   On: 05/10/2018 19:07     Knee-L DG 4 views:  Results for orders placed during the hospital encounter of 09/22/17  DG Knee Complete 4 Views Left   Narrative CLINICAL DATA:  Chronic left knee pain for years. Knee pops and cracks.  EXAM: LEFT KNEE - COMPLETE 4+ VIEW  COMPARISON:  None.  FINDINGS: Tricompartmental degenerative marginal spurring with advanced asymmetric lateral compartment narrowing. Bones are osteopenic. No fracture deformity or evidence of bone lesion. Negative for joint effusion. Atherosclerotic  calcifications seen at the level of the thigh and calf.  IMPRESSION: 1. 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 2. Osteopenia.   Electronically Signed   By: Monte Fantasia M.D.   On: 09/23/2017 14:29     Complexity Note: Imaging results reviewed. Results shared with Ms. Shoe, using Layman's terms.                         ROS  Cardiovascular: Daily Aspirin intake, High blood pressure and Blood thinners:  Anticoagulant PLAVIX Pulmonary or Respiratory: Snoring  Neurological: Stroke (Residual deficits or weakness: no) Psychological-Psychiatric: No reported psychological or psychiatric signs or symptoms such as difficulty sleeping, anxiety, depression, delusions or hallucinations (schizophrenial), mood swings (bipolar disorders) or suicidal ideations or attempts Gastrointestinal: Reflux or heatburn Genitourinary: Kidney disease Hematological: No reported hematological signs or symptoms such as prolonged bleeding, low or poor functioning platelets, bruising or bleeding easily, hereditary bleeding problems, low energy levels due to low hemoglobin or being anemic Endocrine: High blood sugar requiring insulin (IDDM) Rheumatologic: No reported rheumatological signs and symptoms such as fatigue, joint pain, tenderness, swelling, redness, heat, stiffness, decreased range of motion, with or without associated rash Musculoskeletal: Negative for myasthenia gravis, muscular dystrophy, multiple sclerosis or malignant hyperthermia Work History: Retired  Allergies  Ms. Matura is allergic to atorvastatin; gabapentin; pravastatin; pregabalin; and trulicity [dulaglutide].  Laboratory Chemistry Profile   Renal Lab Results  Component Value Date   BUN 56 (H) 12/03/2019   CREATININE 2.14 (H) 12/03/2019   BCR 19 05/18/2019   GFRAA 26 (L) 12/03/2019   GFRNONAA 23 (L) 12/03/2019   PROTEINUR 30 (A) 12/01/2019    Electrolytes Lab Results   Component Value Date   NA 143 12/03/2019   K 4.8 12/03/2019   CL 113 (H) 12/03/2019   CALCIUM 8.9 12/03/2019   MG 2.3 12/03/2019    Hepatic Lab Results  Component Value Date   AST 21 12/02/2019   ALT 14 12/02/2019   ALBUMIN 3.1 (L) 12/02/2019   ALKPHOS 73 12/02/2019    ID Lab Results  Component Value Date   SARSCOV2NAA NEGATIVE 01/12/2020    Bone Lab Results  Component Value Date   VD25OH 26.9 (L) 05/18/2019    Endocrine Lab Results  Component Value Date   GLUCOSE 113 (H) 12/03/2019   GLUCOSEU NEGATIVE 12/01/2019   HGBA1C 9.8 (H) 12/02/2019   TSH 2.180 07/14/2017    Neuropathy Lab Results  Component Value Date   VITAMINB12 305 02/15/2019   HGBA1C 9.8 (H) 12/02/2019    CNS No results found for: COLORCSF, APPEARCSF, RBCCOUNTCSF, WBCCSF, POLYSCSF, LYMPHSCSF, EOSCSF, PROTEINCSF, GLUCCSF, JCVIRUS, CSFOLI, IGGCSF, LABACHR, ACETBL, LABACHR, ACETBL  Inflammation (CRP: Acute  ESR: Chronic) Lab Results  Component Value Date   CRP 1.1 04/14/2018   ESRSEDRATE 14 04/14/2018  Rheumatology Lab Results  Component Value Date   RF <10.0 04/14/2018   ANA Negative 04/14/2018    Coagulation Lab Results  Component Value Date   PLT 248 12/27/2019    Cardiovascular Lab Results  Component Value Date   CKTOTAL 58 01/15/2018   HGB 9.8 (L) 12/27/2019   HCT 33.0 (L) 12/27/2019    Screening Lab Results  Component Value Date   SARSCOV2NAA NEGATIVE 01/12/2020   COVIDSOURCE NASOPHARYNGEAL 06/02/2019    Cancer No results found for: CEA, CA125, LABCA2  Allergens No results found for: ALMOND, APPLE, ASPARAGUS, AVOCADO, BANANA, BARLEY, BASIL, BAYLEAF, GREENBEAN, LIMABEAN, WHITEBEAN, BEEFIGE, REDBEET, BLUEBERRY, BROCCOLI, CABBAGE, MELON, CARROT, CASEIN, CASHEWNUT, CAULIFLOWER, CELERY    Note: Lab results reviewed.   PFSH  Drug: Ms. Tien  reports no history of drug use. Alcohol:  reports no history of alcohol use. Tobacco:  reports that she has never smoked. She has never  used smokeless tobacco. Medical:  has a past medical history of (HFpEF) heart failure with preserved ejection fraction ([place]), CKD (chronic kidney disease), stage III, Diabetes mellitus without complication ([place]), Hyperlipidemia, Hypertension, and Stroke ([place]). Family: family history includes Diabetes in her brother, brother, mother, and sister; Gout in her son; Heart attack in her sister; Hyperlipidemia in her father; Hypertension in her father; Kidney disease in her son; Lymphoma in her sister; Stroke in her mother.  Past Surgical History:  Procedure Laterality Date  . COLONOSCOPY  12/15/2006  . COLONOSCOPY WITH PROPOFOL N/A 06/06/2019   Procedure: COLONOSCOPY WITH PROPOFOL;  Surgeon: [name], MD;  [facility] ENDOSCOPY;  Service: Gastroenterology;  Laterality: N/A;  . COLONOSCOPY WITH PROPOFOL N/A 01/16/2020   Procedure: COLONOSCOPY WITH BIOPSY;  Surgeon: [name], MD;  Location: [place];  Service: Endoscopy;  Laterality: N/A;  Diabetic - insulin and oral meds  . POLYPECTOMY N/A 01/16/2020   Procedure: POLYPECTOMY;  Surgeon: [name], MD;  Location: [place];  Service: Endoscopy;  Laterality: N/A;  . TUBAL LIGATION     Active Ambulatory Problems    Diagnosis Date Noted  . Poorly controlled type 2 diabetes mellitus with neuropathy ([place]) 05/01/2015  . Hyperlipidemia associated with type 2 diabetes mellitus ([place]) 05/01/2015  . Chronic kidney disease, stage 4, severely decreased GFR ([place]) 05/01/2015  . Encounter for long-term (current) use of insulin ([place]) 05/01/2015  . Anxiety and depression 08/21/2015  . Hip bursitis 09/18/2015  . Post herpetic neuralgia 10/10/2016  . Advanced care planning/counseling discussion 04/13/2017  . Chronic pain of left knee 09/21/2017  . 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 Rotator cuff disorder 01/15/2018  . Anemia in CKD (chronic kidney disease) 02/22/2019  . Uncontrolled type  2 diabetes mellitus with hyperglycemia, with long-term current use of insulin ([place]) 03/15/2019  . Non compliance w medication regimen 03/15/2019  . Acute cerebrovascular accident (CVA) ([place]) 12/01/2019  . Hyperparathyroidism due to renal insufficiency ([place]) 08/31/2019  . Grade I diastolic dysfunction 18/56/3149  . Chronic heart failure with preserved ejection fraction (HFpEF) ([place]) 01/11/2020   Resolved Ambulatory Problems    Diagnosis Date Noted  . Hypertensive chronic kidney disease with stage 1 through stage 4 chronic kidney disease, or unspecified chronic kidney disease 08/21/2015  . Bilateral leg edema 04/13/2017  . Swelling of limb 09/22/2017  . Musculoskeletal neck pain 01/15/2018  . Myalgia 04/14/2018  . Postural dizziness with presyncope 04/14/2018  . Rash 04/14/2018  . Low back pain 05/12/2018  . Essential (  primary) hypertension 03/15/2019  . Hyperlipidemia 05/01/2015  . Anemia in chronic kidney disease 08/31/2019   Past Medical History:  Diagnosis Date  . (HFpEF) heart failure with preserved ejection fraction ([place])   . CKD (chronic kidney disease), stage III   . Diabetes mellitus without complication ([place])   . Hypertension   . Stroke [facility])    Constitutional Exam  General appearance: Well nourished, well developed, and well hydrated. In no apparent acute distress Vitals:   03/07/20 0940  BP: (!) 145/89  Pulse: 97  Resp: 18  Temp: 98.3 F (36.8 C)  TempSrc: Oral  SpO2: 100%  Weight: 177 lb (80.3 kg)  Height: 5' 7"  (1.702 m)   BMI Assessment: Estimated body mass index is 27.72 kg/m as calculated from the following:   Height as of this encounter: 5' 7"  (1.702 m).   Weight as of this encounter: 177 lb (80.3 kg).  BMI interpretation table: BMI level Category Range association with higher incidence of chronic pain  <18 kg/m2 Underweight   18.5-24.9 kg/m2 Ideal body weight   25-29.9 kg/m2 Overweight Increased incidence by 20%  30-34.9 kg/m2 Obese (Class I)  Increased incidence by 68%  35-39.9 kg/m2 Severe obesity (Class II) Increased incidence by 136%  >40 kg/m2 Extreme obesity (Class III) Increased incidence by 254%   Patient's current BMI Ideal Body weight  Body mass index is 27.72 kg/m. Ideal body weight: 61.6 kg (135 lb 12.9 oz) Adjusted ideal body weight: 69.1 kg (152 lb 4.5 oz)   BMI Readings from Last 4 Encounters:  03/07/20 27.72 kg/m  01/16/20 27.72 kg/m  01/11/20 27.88 kg/m  01/05/20 28.19 kg/m   Wt Readings from Last 4 Encounters:  03/07/20 177 lb (80.3 kg)  01/16/20 177 lb (80.3 kg)  01/11/20 178 lb (80.7 kg)  01/05/20 180 lb (81.6 kg)    Psych/Mental status: Alert, oriented x 3 (person, place, & time)       Eyes: PERLA Respiratory: No evidence of acute respiratory distress  Cervical Spine Exam  Skin & Axial Inspection: No masses, redness, edema, swelling, or associated skin lesions Alignment: Symmetrical Functional ROM: Unrestricted ROM      Stability: No instability detected Muscle Tone/Strength: Functionally intact. No obvious neuro-muscular anomalies detected. Sensory (Neurological): Unimpaired Palpation: No palpable anomalies              Upper Extremity (UE) Exam    Side: Right upper extremity  Side: Left upper extremity  Skin & Extremity Inspection: Skin color, temperature, and hair growth are WNL. No peripheral edema or cyanosis. No masses, redness, swelling, asymmetry, or associated skin lesions. No contractures.  Skin & Extremity Inspection: Skin color, temperature, and hair growth are WNL. No peripheral edema or cyanosis. No masses, redness, swelling, asymmetry, or associated skin lesions. No contractures.  Functional ROM: Unrestricted ROM          Functional ROM: Unrestricted ROM          Muscle Tone/Strength: Functionally intact. No obvious neuro-muscular anomalies detected.  Muscle Tone/Strength: Functionally intact. No obvious neuro-muscular anomalies detected.  Sensory (Neurological): Unimpaired           Sensory (Neurological): Unimpaired          Palpation: No palpable anomalies              Palpation: No palpable anomalies              Provocative Test(s):  Phalen's test: deferred Tinel's test: deferred Apley's scratch test (touch opposite shoulder):  Action 1 (Across chest): deferred Action 2 (Overhead): deferred Action 3 (LB reach): deferred   Provocative Test(s):  Phalen's test: deferred Tinel's test: deferred Apley's scratch test (touch opposite shoulder):  Action 1 (Across chest): deferred Action 2 (Overhead): deferred Action 3 (LB reach): deferred    Thoracic Spine Area Exam  Skin & Axial Inspection: No masses, redness, or swelling Alignment: Symmetrical Functional ROM: Unrestricted ROM Stability: No instability detected Muscle Tone/Strength: Functionally intact. No obvious neuro-muscular anomalies detected. Sensory (Neurological): Unimpaired Muscle strength & Tone: No palpable anomalies  Lumbar Exam  Skin & Axial Inspection: No masses, redness, or swelling Alignment: Symmetrical Functional ROM: Pain restricted ROM       Stability: No instability detected Muscle Tone/Strength: Functionally intact. No obvious neuro-muscular anomalies detected. Sensory (Neurological): Neuropathic pain pattern right lumbar region  Gait & Posture Assessment  Ambulation: Unassisted Gait: Relatively normal for age and body habitus Posture: WNL   Lower Extremity Exam    Side: Right lower extremity  Side: Left lower extremity  Stability: No instability observed          Stability: No instability observed          Skin & Extremity Inspection: Skin color, temperature, and hair growth are WNL. No peripheral edema or cyanosis. No masses, redness, swelling, asymmetry, or associated skin lesions. No contractures.  Skin & Extremity Inspection: Skin color, temperature, and hair growth are WNL. No peripheral edema or cyanosis. No masses, redness, swelling, asymmetry, or associated skin  lesions. No contractures.  Functional ROM: Unrestricted ROM                  Functional ROM: Unrestricted ROM                  Muscle Tone/Strength: Functionally intact. No obvious neuro-muscular anomalies detected.  Muscle Tone/Strength: Functionally intact. No obvious neuro-muscular anomalies detected.  Sensory (Neurological): Unimpaired        Sensory (Neurological): Unimpaired        DTR: Patellar: deferred today Achilles: deferred today Plantar: deferred today  DTR: Patellar: deferred today Achilles: deferred today Plantar: deferred today  Palpation: No palpable anomalies  Palpation: No palpable anomalies   Assessment  Primary Diagnosis & Pertinent Problem List: The primary encounter diagnosis was Post herpetic neuralgia. Diagnoses of Acute cerebrovascular accident (CVA) ([place]), Poorly controlled type 2 diabetes mellitus with neuropathy ([place]), Chronic kidney disease, stage 4, severely decreased GFR ([place]), Neuropathic pain, and Chronic pain syndrome were also pertinent to this visit.  Visit Diagnosis (New problems to examiner): 1. Post herpetic neuralgia   2. Acute cerebrovascular accident (CVA) ([place])   3. Poorly controlled type 2 diabetes mellitus with neuropathy ([place])   4. Chronic kidney disease, stage 4, severely decreased GFR (H[facility])   5. Neuropathic pain   6. Chronic pain syndrome    I had extensive discussion with the patient about the goals of pain management.  We discussed nonpharmacological approaches to pain management that include physical therapy, dieting, sleep hygiene, psychotherapy, interventional therapy.  We discussed the importance of understanding the type of pain including neuropathic, nociceptive, centralized.  I also stressed the importance of multimodal analgesia with an emphasis on nondrug modalities including self management, behavioral health support and physical therapy.  We discussed the importance of physical therapy and how a individualized physical therapy  and occupational therapy program tailored to patient limitations can be helpful at improving physical function. We also discussed the importance of insomnia and disrupted sleep and how improved sleep hygiene  and cognitive therapy could be helpful.  Psychotherapy including CBT, mind-body therapies, pain coping strategies can be helpful for patients whose pain impacts mood, sleep, quality of life, relationships with others.  We discussed avoiding benzodiazepines.  I also had an extensive discussion with the patient about interventional therapies which is my expertise and how these could be incorporated into an effective multimodal pain management plan.  Plan of Care    Patient has tried and and was unable to tolerate first-line therapies for postherpetic neuralgia including gabapentin and Lyrica.  I do not recommend a TCA given her age and previous side effects of confusion with membrane stabilizer such as gabapentin and Lyrica.  I would also like to avoid opioid therapy as they are not effective for postherpetic neuralgia and can increase her risk of sedation, confusion, falls.  Patient has not tried Cymbalta which could be helpful for postherpetic neuralgia.  We will start low-dose at 20 mg daily.  Also recommend capsaicin cream that patient can apply to her right lower lumbar region to help out with her neuropathic pain.  Recommend patient utilize topical Benadryl for pruritus in that region.  Pharmacotherapy (current): Medications ordered:  Meds ordered this encounter  Medications  . DULoxetine (CYMBALTA) 20 MG capsule    Sig: Take 1 capsule (20 mg total) by mouth daily.    Dispense:  30 capsule    Refill:  1  . Capsaicin 0.05 % GEL    Sig: Apply 1 Units topically 4 (four) times daily as needed.    Dispense:  60 g    Refill:  1   Medications administered during this visit: [name] had no medications administered during this visit.   Pharmacological management options:  Opioid  Analgesics: Avoid  Membrane stabilizer: Side effects of sedation and confusion with gabapentin and Lyrica.  Trial Cymbalta today.  Avoid TCA  Muscle relaxant: Not indicated  NSAID: Medically contraindicated  Other analgesic(s): Topical capsaicin cream.  Can consider compounded creams in future   Interventional management options: Ms. Lindseth was informed that there is no guarantee that she would be a candidate for interventional therapies. The decision will be based on the results of diagnostic studies, as well as Ms. Nohr's risk profile.  Procedure(s) under consideration:  High risk to be off Plavix   Provider-requested follow-up: Return in about 6 weeks (around 04/18/2020) for Medication Management.  Future Appointments  Date Time Provider [place]  03/21/2020  3:30 PM [facility]FP CCM CASE MANAGER [facility]  04/10/2020 10:45 AM [name] T, NP [facility]  04/18/2020  2:00 PM Tonya Santa, MD [facility] None    Note by: Tonya Santa, MD Date: 03/07/2020; Time: 11:29 AM

## 2020-03-07 NOTE — Progress Notes (Signed)
Safety precautions to be maintained throughout the outpatient stay will include: orient to surroundings, keep bed in low position, maintain call bell within reach at all times, provide assistance with transfer out of bed and ambulation.  

## 2020-03-09 ENCOUNTER — Ambulatory Visit (INDEPENDENT_AMBULATORY_CARE_PROVIDER_SITE_OTHER): Payer: Medicare Other

## 2020-03-09 DIAGNOSIS — B0229 Other postherpetic nervous system involvement: Secondary | ICD-10-CM

## 2020-03-09 DIAGNOSIS — I1310 Hypertensive heart and chronic kidney disease without heart failure, with stage 1 through stage 4 chronic kidney disease, or unspecified chronic kidney disease: Secondary | ICD-10-CM | POA: Diagnosis not present

## 2020-03-09 DIAGNOSIS — E1165 Type 2 diabetes mellitus with hyperglycemia: Secondary | ICD-10-CM | POA: Diagnosis not present

## 2020-03-09 DIAGNOSIS — N184 Chronic kidney disease, stage 4 (severe): Secondary | ICD-10-CM | POA: Diagnosis not present

## 2020-03-09 DIAGNOSIS — E1140 Type 2 diabetes mellitus with diabetic neuropathy, unspecified: Secondary | ICD-10-CM

## 2020-03-09 NOTE — Chronic Care Management (AMB) (Signed)
Chronic Care Management   Follow Up Note   03/09/2020 Name: Tonya [name] MRN: [id number] DOB: May 17, 1946  Referred by: Tonya Lick, NP Reason for referral : Chronic Care Management (Medication Management)   Tonya [name] is a 74 y.o. year old female who is a primary care patient of Cannady, Tonya Faster, NP. The CCM team was consulted for assistance with chronic disease management and care coordination needs.    Contacted patient for medication management review. She was at the salon getting her hair done, could not talk for long.  Review of patient status, including review of consultants reports, relevant laboratory and other test results, and collaboration with appropriate care team members and the patient's provider was performed as part of comprehensive patient evaluation and provision of chronic care management services.    SDOH (Social Determinants of Health) assessments performed: Yes See Care Plan activities for detailed interventions related to [facility])     Outpatient Encounter Medications as of 03/09/2020  Medication Sig Note  . aspirin 81 MG tablet Take 81 mg by mouth daily.   . Capsaicin 0.05 % GEL Apply 1 Units topically 4 (four) times daily as needed.   . carvedilol (COREG) 3.125 MG tablet Take 1 tablet (3.125 mg total) by mouth 2 (two) times daily with a meal.   . clopidogrel (PLAVIX) 75 MG tablet TAKE 1 TABLET BY MOUTH  DAILY   . DULoxetine (CYMBALTA) 20 MG capsule Take 1 capsule (20 mg total) by mouth daily.   . ferrous sulfate 325 (65 FE) MG EC tablet Take 1 tablet (325 mg total) by mouth 3 (three) times daily with meals. 01/11/2020: Taking twice daily   . furosemide (LASIX) 20 MG tablet Take 1 tablet (20 mg total) by mouth daily.   [name] HUMALOG KWIKPEN 100 UNIT/ML KiwkPen TK 12 UNITS BEFORE LUNCH AND 8 UNITS BEFORE SUPPER. 03/09/2020: 6 units  . insulin detemir (LEVEMIR) 100 UNIT/ML injection Inject 12 Units into the skin every morning.    . olmesartan (BENICAR) 40 MG  tablet Take 40 mg by mouth daily.   [name] omeprazole (PRILOSEC) 20 MG capsule TAKE 1 CAPSULE BY MOUTH  TWICE DAILY   . pioglitazone (ACTOS) 30 MG tablet Take 30 mg by mouth daily.   . rosuvastatin (CRESTOR) 5 MG tablet Take 1 tablet (5 mg total) by mouth daily.   . Vitamin D, Cholecalciferol, 50 MCG (2000 UT) CAPS Take 2,000 Units by mouth daily.    No facility-administered encounter medications on file as of 03/09/2020.     Objective:   Goals Addressed            This Visit's Progress     Patient Stated   . PharmD "I need to stay healthy" (pt-stated)       CARE PLAN ENTRY (see longtitudinal plan of care for additional care plan information)  Current Barriers:  . Diabetes: uncontrolled; complicated by chronic medical conditions including HTN, CKD, most recent A1c 8.9% o Pt had forgotten about our call and was at the hair salon today, so unable to fully review any written instructions she has at home . Most recent eGFR: 34 mL/min . Current antihyperglycemic regimen: Levemir 10 units daily - though has only been taking 8 units; Humalog 6-8 units before meals if sugar >100; pioglitazone 30 mg daily (cardiology recommended consideration of d/c, endocrinology continued) . Denies hypoglycemic symptoms, including dizziness, lightheadedness, shaking, sweating . Current blood glucose readings:  o Reports fastings in the 200s, evenings in the  220-250s . Cardiovascular risk reduction: o Current hypertensive regimen: carvedilol 3.125 mg BID, olmesartan 40 mg - just restarted by nephrology o Current hyperlipidemia regimen: rosuvastatin 5 mg daily; last LDL 80, consider more stringent goal of <70 given hx CVA o Current antiplatelet regimen: ASA 81 mg daily, clopidogrel 75 mg daily (hx acute CVA in 2018) . 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 Patient confirms today that she has  started these medications, but does not expand upon benefit during our call today.   Pharmacist Clinical Goal(s):  [name] Over the next 90 days, patient will work with PharmD and primary care provider to address optimized medication management  Interventions: . Reviewed instructions per Dr. Joycie [name] last visit- increase Levemir to 10 units every morning, Humalog 6 units before meals if sugars >100. Will type this information and mail to patient as well . As patient was at the salon and we could not fully review medications, will mail list of her medications to her for verification . Encouraged adherence to daily SMBG monitoring, and to take readings to next appt w/ Dr. Gabriel [name].  . Consider d/c ASA 81 mg daily, continue clopidogrel monotherapy, as last CVA was ~3 years ago; risk of bleed outweighs benefit of DAPT . Encouraged continued adherence to duloxetine daily for potential benefit w/ neuropathic pain.  Patient Self Care Activities:  . Patient will check blood glucose BID-TID, document, and provide at future appointments . Patient will take medications as prescribed . Patient will report any questions or concerns to provider   Please see past updates related to this goal by clicking on the "Past Updates" button in the selected goal          Plan:  - Scheduled f/u call 05/02/20  Tonya [name], PharmD, Tonya [name] [phone]

## 2020-03-09 NOTE — Patient Instructions (Addendum)
Ms. Brion,   It was great talking with you today!  Make sure you are taking Levemir 10 units every morning like Dr. [name] asked. Humalog should be 6 units before meals, if you sugar is greater than 100.   The kidney doctor started you back on the blood pressure medication olmesartan. Please make sure you are taking olmesartan 40 mg every day.  I hope the new medication from the pain doctor, the duloxetine 20 mg daily and capsaicin cream, are helping.   Please call with any questions,   [name], PharmD [phone]  Visit Information  Goals Addressed            This Visit's Progress     Patient Stated   . PharmD "I need to stay healthy" (pt-stated)       CARE PLAN ENTRY (see longtitudinal plan of care for additional care plan information)  Current Barriers:  . Diabetes: uncontrolled; complicated by chronic medical conditions including HTN, CKD, most recent A1c 8.9% o Pt had forgotten about our call and was at the hair salon today, so unable to fully review any written instructions she has at home . Most recent eGFR: 34 mL/min . Current antihyperglycemic regimen: Levemir 10 units daily - though has only been taking 8 units; Humalog 6-8 units before meals if sugar >100; pioglitazone 30 mg daily (cardiology recommended consideration of d/c, endocrinology continued) . Denies hypoglycemic symptoms, including dizziness, lightheadedness, shaking, sweating . Current blood glucose readings:  o Reports fastings in the 200s, evenings in the 220-250s . Cardiovascular risk reduction: o Current hypertensive regimen: carvedilol 3.125 mg BID, olmesartan 40 mg - just restarted by nephrology o Current hyperlipidemia regimen: rosuvastatin 5 mg daily; last LDL 80, consider more stringent goal of <70 given hx CVA o Current antiplatelet regimen: ASA 81 mg daily, clopidogrel 75 mg daily (hx acute CVA in 2018) . 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 Patient confirms today that she has started these medications, but does not expand upon benefit during our call today.   Pharmacist Clinical Goal(s):  [name] Over the next 90 days, patient will work with PharmD and primary care provider to address optimized medication management  Interventions: . Reviewed instructions per Dr. Joycie Peek last visit- increase Levemir to 10 units every morning, Humalog 6 units before meals if sugars >100. Will type this information and mail to patient as well . As patient was at the salon and we could not fully review medications, will mail list of her medications to her for verification . Encouraged adherence to daily SMBG monitoring, and to take readings to next appt w/ Dr. [name].  . Consider d/c ASA 81 mg daily, continue clopidogrel monotherapy, as last CVA was ~3 years ago; risk of bleed outweighs benefit of DAPT . Encouraged continued adherence to duloxetine daily for potential benefit w/ neuropathic pain.  Patient Self Care Activities:  . Patient will check blood glucose BID-TID, document, and provide at future appointments . Patient will take medications as prescribed . Patient will report any questions or concerns to provider   Please see past updates related to this goal by clicking on the "Past Updates" button in the selected goal         Patient verbalizes understanding of instructions provided today.   Plan:  - Scheduled f/u call 05/02/20  [name], PharmD, [place] [phone]

## 2020-03-21 ENCOUNTER — Telehealth: Payer: Self-pay

## 2020-03-28 DIAGNOSIS — E113393 Type 2 diabetes mellitus with moderate nonproliferative diabetic retinopathy without macular edema, bilateral: Secondary | ICD-10-CM | POA: Diagnosis not present

## 2020-03-28 DIAGNOSIS — Z794 Long term (current) use of insulin: Secondary | ICD-10-CM | POA: Diagnosis not present

## 2020-03-28 LAB — HEMOGLOBIN A1C: Hemoglobin A1C: 8.5

## 2020-04-04 DIAGNOSIS — I10 Essential (primary) hypertension: Secondary | ICD-10-CM | POA: Diagnosis not present

## 2020-04-04 DIAGNOSIS — E113393 Type 2 diabetes mellitus with moderate nonproliferative diabetic retinopathy without macular edema, bilateral: Secondary | ICD-10-CM | POA: Diagnosis not present

## 2020-04-04 DIAGNOSIS — Z794 Long term (current) use of insulin: Secondary | ICD-10-CM | POA: Diagnosis not present

## 2020-04-04 DIAGNOSIS — E1165 Type 2 diabetes mellitus with hyperglycemia: Secondary | ICD-10-CM | POA: Diagnosis not present

## 2020-04-04 DIAGNOSIS — E1121 Type 2 diabetes mellitus with diabetic nephropathy: Secondary | ICD-10-CM | POA: Diagnosis not present

## 2020-04-05 ENCOUNTER — Other Ambulatory Visit: Payer: Self-pay | Admitting: Nurse Practitioner

## 2020-04-10 ENCOUNTER — Other Ambulatory Visit: Payer: Self-pay

## 2020-04-10 ENCOUNTER — Encounter: Payer: Self-pay | Admitting: Nurse Practitioner

## 2020-04-10 ENCOUNTER — Ambulatory Visit (INDEPENDENT_AMBULATORY_CARE_PROVIDER_SITE_OTHER): Payer: Medicare Other | Admitting: Nurse Practitioner

## 2020-04-10 VITALS — BP 132/86 | HR 73 | Temp 98.1°F | Wt 175.0 lb

## 2020-04-10 DIAGNOSIS — E1165 Type 2 diabetes mellitus with hyperglycemia: Secondary | ICD-10-CM | POA: Diagnosis not present

## 2020-04-10 DIAGNOSIS — D631 Anemia in chronic kidney disease: Secondary | ICD-10-CM

## 2020-04-10 DIAGNOSIS — E785 Hyperlipidemia, unspecified: Secondary | ICD-10-CM | POA: Diagnosis not present

## 2020-04-10 DIAGNOSIS — E1169 Type 2 diabetes mellitus with other specified complication: Secondary | ICD-10-CM | POA: Diagnosis not present

## 2020-04-10 DIAGNOSIS — I5032 Chronic diastolic (congestive) heart failure: Secondary | ICD-10-CM

## 2020-04-10 DIAGNOSIS — N184 Chronic kidney disease, stage 4 (severe): Secondary | ICD-10-CM | POA: Diagnosis not present

## 2020-04-10 DIAGNOSIS — I1310 Hypertensive heart and chronic kidney disease without heart failure, with stage 1 through stage 4 chronic kidney disease, or unspecified chronic kidney disease: Secondary | ICD-10-CM

## 2020-04-10 DIAGNOSIS — N2581 Secondary hyperparathyroidism of renal origin: Secondary | ICD-10-CM

## 2020-04-10 DIAGNOSIS — B0229 Other postherpetic nervous system involvement: Secondary | ICD-10-CM

## 2020-04-10 DIAGNOSIS — E1140 Type 2 diabetes mellitus with diabetic neuropathy, unspecified: Secondary | ICD-10-CM | POA: Diagnosis not present

## 2020-04-10 LAB — MICROALBUMIN, URINE WAIVED
Creatinine, Urine Waived: 100 mg/dL (ref 10–300)
Microalb, Ur Waived: 150 mg/L — ABNORMAL HIGH (ref 0–19)
Microalb/Creat Ratio: >300 mg/g — ABNORMAL HIGH (ref <30)

## 2020-04-10 NOTE — Assessment & Plan Note (Signed)
Chronic, ongoing, followed by nephrology with recent labs obtained.  Continue collaboration with nephrology.

## 2020-04-10 NOTE — Patient Instructions (Signed)
YOU SEE DR. LATEEF WITH PAIN MANAGEMENT on 04/18/2020   Postherpetic Neuralgia Postherpetic neuralgia (PHN) is nerve pain that occurs after a shingles infection. Shingles is a painful rash that appears on one area of the body, usually on the trunk or face. Shingles is caused by the varicella-zoster virus. This is the same virus that causes chickenpox. In people who have had chickenpox, the virus can resurface years later and cause shingles. You may have PHN if you continue to have pain for 4 months after your shingles rash has gone away. PHN appears in the same area where you had the shingles rash. The pain usually goes away after the rash disappears. Getting a vaccination for shingles can prevent PHN. This vaccine is recommended for people older than 60. It may prevent shingles, and may also lower your risk of PHN if you do get shingles. What are the causes? This condition is caused by damage to your nerves from the varicella-zoster virus. The damage makes your nerves overly sensitive. What increases the risk? The following factors may make you more likely to develop this condition:  Being older than 72 years of age.  Having severe pain before your shingles rash starts.  Having a severe rash.  Having shingles in and around the eye area.  Having a disease that makes your body unable to fight infections (weak immune system). What are the signs or symptoms? The main symptom of this condition is pain. The pain may:  Often be very bad and may be described as stabbing, burning, or feeling like an electric shock.  Come and go or may be there all the time.  Be triggered by light touches on the skin or changes in temperature. You may have itching along with the pain. How is this diagnosed? This condition may be diagnosed based on your symptoms and your history of shingles. Lab studies and other diagnostic tests are usually not needed. How is this treated? There is no cure for this condition.  Treatment for PHN will focus on pain relief. Over-the-counter pain relievers do not usually relieve PHN pain. You may need to work with a pain specialist. Treatment may include:  Antidepressant medicines to help with pain and improve sleep.  Anti-seizure medicines to relieve nerve pain.  Strong pain relievers (opioids).  A numbing patch worn on the skin (lidocaine patch).  Botox (botulinum toxin) injections to block pain signals between nerves and muscles.  Injections of numbing medicine or anti-inflammatory medicines around irritated nerves. Follow these instructions at home:   It may take a long time to recover from PHN. Work closely with your health care provider and develop a good support system at home.  Take over-the-counter and prescription medicines only as told by your health care provider.  Do not drive or use heavy machinery while taking prescription pain medicine.  Wear loose, comfortable clothing.  Cover sensitive areas with a dressing to reduce friction from clothing rubbing on the area.  If directed, put ice on the painful area: ? Put ice in a plastic bag. ? Place a towel between your skin and the bag. ? Leave the ice on for 20 minutes, 2-3 times a day.  Talk to your health care provider if you feel depressed or desperate. Living with long-term pain can be depressing.  Keep all follow-up visits as told by your health care provider. This is important. Contact a health care provider if:  Your medicine is not helping.  You are struggling to manage your pain  at home. Summary  Postherpetic neuralgia is a very painful disorder that can occur after an episode of shingles.  The pain is often severe, burning, electric, or stabbing.  Prescription medicines can be helpful in managing persistent pain.  Getting a vaccination for shingles can prevent PHN. This vaccine is recommended for people older than 60. This information is not intended to replace advice given to  you by your health care provider. Make sure you discuss any questions you have with your health care provider. Document Revised: 11/13/2017 Document Reviewed: 02/17/2017 Elsevier Patient Education  [place].

## 2020-04-10 NOTE — Assessment & Plan Note (Signed)
Chronic with CKD.  Continue iron supplement daily and collaboration with hematology and nephrology teams.

## 2020-04-10 NOTE — Assessment & Plan Note (Addendum)
Chronic, ongoing, followed by endocrinology.  Recent A1C 8.5% with endo. Continue current medication regimen and consider reduction if increased hypoglycemic episodes.  Cardiology and PCP have recommended to endo discontinuing Actos due to her HF.  Continue to collaborate with Dr. [name] and CCM team.  Recommend she monitor BS at least 3 times a day at home.  Return in 3 months.

## 2020-04-10 NOTE — Progress Notes (Addendum)
BP 132/86 (BP Location: Left Arm)   Pulse 73   Temp 98.1 F (36.7 C) (Oral)   Wt 175 lb (79.4 kg)   LMP  (LMP Unknown)   SpO2 98%   BMI 27.41 kg/m    Subjective:    Patient ID: Tonya [name], female    DOB: 1946/01/30, 74 y.o.   MRN: [id number]  HPI: Tonya [name] is a 74 y.o. female  Chief Complaint  Patient presents with  . Diabetes  . Hypertension  . Hyperlipidemia   DIABETES Last saw Dr. [name] 04/04/20 with A1C 8.5%.  Was provided a [employer], which she liked, but is waiting on paperwork to see if will be approved.  Cardiology and PCP have recommended to endo discontinuation of Actos due to her HF, she reports this medication continues.  Does endorse she has been eating differently and focused more on diet.  Previously has tried [place] and [employer]. Hypoglycemic episodes:she states not lately, but has in past Polydipsia/polyuria:no Visual disturbance:no Chest pain:no Paresthesias:no Glucose Monitoring:yes Accucheck frequency: TID -- range 78 to 304 Fasting glucose:  100-200 Post prandial: Evening: 136 to 244 Before meals:              Taking Insulin?:yes Long acting insulin: Levemir 10 units in morning -- she thinks she is                     taking 8 Short acting insulin: Humalog 6 units -- usually takes this at 12 pm and 7              pm Blood Pressure Monitoring:a few times a week Retinal Examination:Not Up to Date Foot Exam:Up to Date Pneumovax:Up to Date Influenza:Up to Date Aspirin:yes  HYPERTENSION / HYPERLIPIDEMIA/HF Followed by cardiology, Dr. [name] Situ.  Continues on Carvedilol, Lasix, and Crestor. Last seen by cardiology on 01/05/20.  Her LVEF on 12/02/2019 was 60 to 65% with moderate LVH, and grade 1 diastolic dysfunction.  Discussed use of compression hose with her and she reports occasional use, but not consistent.  Does not weight herself daily  at home.   Satisfied with current treatment?yes Duration of hypertension:chronic BP monitoring frequency:a few times a week BP range:130/80's  BP medication side effects:no Duration of hyperlipidemia:chronic Cholesterol medication side effects:no Cholesterol supplements: none Medication compliance:good compliance Aspirin:yes Recent stressors:no Recurrent headaches:no Visual changes:no Palpitations:no Dyspnea:no Chest pain:no Lower extremity edema:no Dizzy/lightheaded:no  CHRONIC KIDNEY DISEASE Followed by nephrology and last seen 03/07/20. Has underlying CKD with 03/07/20 labs showing CRT 2.19 and GFR 25, PTH 78, BUN 43 with nephrology.  Is also followed by hematology for chronic disease anemia and last seen 12/27/2019.  Plan is for erythropoietin after patient has repeat colonoscopy. CKD status: stable Medications renally dose: yes Previous renal evaluation: yes Pneumovax:  Up to Date Influenza Vaccine:  Up to Date   POSTHERPETIC NEURALGIA: Her pain is intermittent, sharp, burning to lower back.  10/10 at worst and 2/10 at best, on average pain is a 5/10.  [place] bothers it more, anything that touches.  Had shingles years ago and since that time has had ongoing issues with pain to lower right back. She is intolerant to Gabapentin and had issues with Pregabalin (change in mental status and hospital admission) + has tried Lidocaine patches with no benefit.  Has history of imaging to lumbar spine in 2016, where her pain is, and it showed "diffuse multilevel degenerative changes lumbar spine".  Currently seeing pain management who started  her on Cymbalta and Capsaicin gel -- she reports Cymbalta is offering no benefit at this time and cream makes her sneeze and bother eyes.  She is to return to Dr. Holley Raring 04/18/20.    Relevant past medical, surgical, family and social history reviewed and updated as indicated. Interim medical history since our last visit reviewed. Allergies  and medications reviewed and updated.  Review of Systems  Constitutional: Negative for activity change, appetite change, diaphoresis, fatigue and fever.  Respiratory: Negative for cough, chest tightness and shortness of breath.   Cardiovascular: Negative for chest pain, palpitations and leg swelling.  Gastrointestinal: Negative for abdominal distention, abdominal pain, constipation, diarrhea, nausea and vomiting.  Endocrine: Negative for cold intolerance, heat intolerance, polydipsia, polyphagia and polyuria.  Musculoskeletal: Positive for back pain.  Neurological: Negative for dizziness, syncope, weakness, light-headedness, numbness and headaches.  Psychiatric/Behavioral: Negative.     Per HPI unless specifically indicated above     Objective:    BP 132/86 (BP Location: Left Arm)   Pulse 73   Temp 98.1 F (36.7 C) (Oral)   Wt 175 lb (79.4 kg)   LMP  (LMP Unknown)   SpO2 98%   BMI 27.41 kg/m   Wt Readings from Last 3 Encounters:  04/10/20 175 lb (79.4 kg)  03/07/20 177 lb (80.3 kg)  01/16/20 177 lb (80.3 kg)    Physical Exam Vitals and nursing note reviewed.  Constitutional:      General: She is awake. She is not in acute distress.    Appearance: She is well-developed. She is not ill-appearing.  HENT:     Head: Normocephalic.     Right Ear: Hearing normal.     Left Ear: Hearing normal.  Eyes:     General: Lids are normal.        Right eye: No discharge.        Left eye: No discharge.     Conjunctiva/sclera: Conjunctivae normal.     Pupils: Pupils are equal, round, and reactive to light.  Neck:     Thyroid: No thyromegaly.     Vascular: No carotid bruit.  Cardiovascular:     Rate and Rhythm: Normal rate and regular rhythm.     Heart sounds: Normal heart sounds. No murmur. No gallop.   Pulmonary:     Effort: Pulmonary effort is normal. No accessory muscle usage or respiratory distress.     Breath sounds: Normal breath sounds.  Abdominal:     General: Bowel  sounds are normal.     Palpations: Abdomen is soft.  Musculoskeletal:     Cervical back: Normal range of motion and neck supple.     Right lower leg: 1+ Edema present.     Left lower leg: 1+ Edema present.  Skin:    General: Skin is warm and dry.     Findings: No rash.  Neurological:     Mental Status: She is alert and oriented to person, place, and time.  Psychiatric:        Attention and Perception: Attention normal.        Mood and Affect: Mood normal.        Behavior: Behavior normal. Behavior is cooperative.        Thought Content: Thought content normal.        Judgment: Judgment normal.   Diabetic Foot Exam - Simple   Simple Foot Form Visual Inspection See comments: Yes Sensation Testing See comments: Yes Pulse Check Posterior Tibialis and Dorsalis pulse intact  bilaterally: Yes Comments Skin mildly dry. Monofilament right 6/10 and left 6/10.      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  Reports she is taking medication as ordered, daughter is prepping.  Continue current medication regimen and adjust as needed.  Had recent labs with nephrology.  Recommend she monitor BP daily at home and document.  Continue to collaborate with nephrology and cardiology + CCM team in house.         Relevant Medications   furosemide (LASIX) 40 MG tablet   Chronic heart failure with preserved ejection fraction (HFpEF) (HCC)    Chronic, ongoing with recent EF 60-65%.  Continue current heart medication regimen and collaboration with cardiology + CCM team in office.  She needs lots of education on HF, needs to have diet reiterated each visit. - Reminded to call for an overnight weight gain of >2 pounds or a weekly weight weight of >5 pounds -- work with CCM team on obtaining scale. - not adding salt to his food and has been reading food labels. Reviewed the importance of keeping daily sodium intake to 2000mg  daily  - Wear compression hose daily -- not on today      Relevant Medications   furosemide (LASIX) 40 MG tablet     Endocrine   Poorly controlled type 2 diabetes mellitus with neuropathy ([place]) - Primary    Chronic, ongoing, followed by endocrinology.  Recent A1C 8.5% with endo. Continue current medication regimen and consider reduction if increased hypoglycemic episodes.  Cardiology and PCP have recommended to endo discontinuing Actos due to her HF.  Continue to collaborate with Dr. [name] and CCM team.  Recommend she monitor BS at least 3 times a day at home.  Return in 3 months.      Relevant Orders   Microalbumin, Urine Waived (Completed)   Ambulatory referral to Ophthalmology   Hyperlipidemia associated with type 2 diabetes mellitus ([place])    Chronic, ongoing.  December LDL 80. Continue Rosuvastatin. Continue to collaborate with [facility].  Lipid panel today.      Relevant Orders   Lipid Panel w/o Chol/HDL Ratio (Completed)   Hyperparathyroidism due to renal insufficiency (HCC)    Chronic, ongoing, followed by nephrology with recent labs obtained.  Continue collaboration with nephrology.         Nervous and Auditory   Post herpetic neuralgia    Chronic with poor tolerance to Gabapentin and Lyrica and minimal relief with simple treatment methods.  Currently on  Cymbalta and has follow-up next week with pain clinic, continue to collaborate with them and review notes.  Genitourinary   Chronic kidney disease, stage 4, severely decreased GFR (HCC)    Chronic, ongoing.  Continue current medication regimen and collaboration with nephrology.  Recent labs performed by nephrology, will not repeat today.  They restarted Benicar.        Other   Anemia in CKD (chronic kidney disease)    Chronic with CKD.  Continue iron supplement daily and collaboration with hematology and nephrology teams.         Follow up plan: Return in about 3 months (around 07/10/2020) for T2DM, HTN/HLD, PAIN.

## 2020-04-10 NOTE — Assessment & Plan Note (Addendum)
Chronic, ongoing with recent EF 60-65%.  Continue current heart medication regimen and collaboration with cardiology + CCM team in office.  She needs lots of education on HF, needs to have diet reiterated each visit. - Reminded to call for an overnight weight gain of >2 pounds or a weekly weight weight of >5 pounds -- work with CCM team on obtaining scale. - not adding salt to his food and has been reading food labels. Reviewed the importance of keeping daily sodium intake to 2000mg  daily  - Wear compression hose daily -- not on today

## 2020-04-10 NOTE — Assessment & Plan Note (Signed)
Chronic with poor tolerance to Gabapentin and Lyrica and minimal relief with simple treatment methods.  Currently on Cymbalta and has follow-up next week with pain clinic, continue to collaborate with them and review notes.

## 2020-04-10 NOTE — Assessment & Plan Note (Signed)
Chronic, ongoing.  December LDL 80. Continue Rosuvastatin. Continue to collaborate with [facility].  Lipid panel today.

## 2020-04-10 NOTE — Assessment & Plan Note (Signed)
Chronic, ongoing.  Continue current medication regimen and collaboration with nephrology.  Recent labs performed by nephrology, will not repeat today.  They restarted Benicar.

## 2020-04-10 NOTE — Assessment & Plan Note (Signed)
Chronic, ongoing with BP initially elevated today, but repeat closer to goal.  Reports she is taking medication as ordered, daughter is prepping.  Continue current medication regimen and adjust as needed.  Had recent labs with nephrology.  Recommend she monitor BP daily at home and document.  Continue to collaborate with nephrology and cardiology + CCM team in house.

## 2020-04-11 LAB — LIPID PANEL W/O CHOL/HDL RATIO
Cholesterol, Total: 187 mg/dL (ref 100–199)
HDL: 71 mg/dL (ref >39)
LDL Chol Calc (NIH): 100 mg/dL — ABNORMAL HIGH (ref 0–99)
Triglycerides: 89 mg/dL (ref 0–149)
VLDL Cholesterol Cal: 16 mg/dL (ref 5–40)

## 2020-04-11 NOTE — Progress Notes (Signed)
Contacted via MyChart

## 2020-04-17 NOTE — Progress Notes (Signed)
Attempted to call for pre appointment review of allergies/meds. Voicemail not set up at mobile number, no voicemail at home number. Rang several times, no answer.

## 2020-04-18 ENCOUNTER — Ambulatory Visit
Admission: RE | Admit: 2020-04-18 | Discharge: 2020-04-18 | Disposition: A | Discharge status: Home / Self Care | Payer: Medicare Other | Source: Ambulatory Visit | Attending: Student in an Organized Health Care Education/Training Program | Admitting: Student in an Organized Health Care Education/Training Program

## 2020-04-18 ENCOUNTER — Other Ambulatory Visit: Payer: Self-pay

## 2020-04-18 ENCOUNTER — Encounter: Payer: Self-pay | Admitting: Student in an Organized Health Care Education/Training Program

## 2020-04-18 ENCOUNTER — Ambulatory Visit (HOSPITAL_BASED_OUTPATIENT_CLINIC_OR_DEPARTMENT_OTHER): Payer: Medicare Other | Admitting: Student in an Organized Health Care Education/Training Program

## 2020-04-18 VITALS — BP 122/75 | HR 96 | Temp 97.3°F | Resp 16 | Ht 67.0 in | Wt 177.0 lb

## 2020-04-18 DIAGNOSIS — M533 Sacrococcygeal disorders, not elsewhere classified: Secondary | ICD-10-CM | POA: Insufficient documentation

## 2020-04-18 DIAGNOSIS — G894 Chronic pain syndrome: Secondary | ICD-10-CM | POA: Insufficient documentation

## 2020-04-18 DIAGNOSIS — N184 Chronic kidney disease, stage 4 (severe): Secondary | ICD-10-CM

## 2020-04-18 DIAGNOSIS — B0229 Other postherpetic nervous system involvement: Secondary | ICD-10-CM | POA: Insufficient documentation

## 2020-04-18 DIAGNOSIS — M792 Neuralgia and neuritis, unspecified: Secondary | ICD-10-CM

## 2020-04-18 DIAGNOSIS — I639 Cerebral infarction, unspecified: Secondary | ICD-10-CM

## 2020-04-18 MED ORDER — TRAMADOL HCL 50 MG PO TABS: 50.0000 mg | ORAL_TABLET | Freq: Two times a day (BID) | ORAL | 1 refills | Status: DC | PRN

## 2020-04-18 NOTE — Progress Notes (Signed)
PROVIDER NOTE: Information contained herein reflects review and annotations entered in association with encounter. Interpretation of such information and data should be left to medically-trained personnel. Information provided to patient can be located elsewhere in the medical record under "Patient Instructions". Document created using STT-dictation technology, any transcriptional errors that may result from process are unintentional.    Patient: Tonya [name]  Service Category: E/M  Provider: Gillis Santa, MD  DOB: 14-Jan-1946  DOS: 04/18/2020  Referring Provider: [name], NP  MRN: [id number]  Setting: Ambulatory outpatient  PCP: [name], NP  Type: Established Patient  Specialty: Interventional Pain Management    Location: Office  Delivery: Face-to-face     Primary Reason(s) for Visit: Encounter for prescription drug management. (Level of risk: moderate)  CC: Back Pain (right)  HPI  Tonya [name] is a 74 y.o. year old, female patient, who comes today for a medication management evaluation. She has Poorly controlled type 2 diabetes mellitus with neuropathy (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 Her primarily concern today is the Back Pain (right)  Pain  Assessment: Location: Right, Lower Back Radiating: denies Onset: More than a month ago Duration: Chronic pain Quality: Burning Severity: 2 /10 (subjective, self-reported pain score)  Note: Reported level is compatible with observation.                         When using our objective Pain Scale, levels between 6 and 10/10 are said to belong in an emergency room, as it progressively worsens from a 6/10, described as severely limiting, requiring emergency care not usually available at an outpatient pain management facility. At a 6/10 level, communication becomes difficult and requires great effort. Assistance to reach the emergency department may be required. Facial flushing and profuse sweating along with potentially dangerous increases in heart rate and blood pressure will be evident. Effect on ADL:   Timing: Constant Modifying factors: nothing BP: 122/75  HR: 96  Tonya [name] was last scheduled for an appointment on 03/07/2020 for medication management. During today's appointment we reviewed [name]'s chronic pain status, as well as her outpatient medication regimen.  Patient endorses burning, stabbing pain that starts in her right lower lumbar spine and radiates in anterior fashion to her right groin region. She also has more superior lateral thoracic, upper lumbar spine pain related to postherpetic neuralgia. She has tried and failed gabapentin and Lyrica in the past. At her last visit, we trialed Cymbalta 20 mg which she states was not effective and is discontinued. She is endorsing pain in her buttock and groin region which could be related to SI joint dysfunction or piriformis syndrome. Will obtain x-rays of bilateral SI joints to evaluate. Given that the patient has failed various neuropathic's do not recommend a TCA given increased risk of sedation, will trial tramadol as below. Risks and benefits were reviewed and patient would like to  proceed. She will complete a urine toxicology screen today  and signed pain contract with [place].  The patient  reports no history of drug use. Her body mass index is 27.72 kg/m.  Further details on both, my assessment(s), as well as the proposed treatment plan, please see below.  Controlled Substance Pharmacotherapy Assessment REMS (Risk Evaluation and Mitigation Strategy)  Analgesic: Start tramadol 50 mg twice daily as needed  Rozetta Nunnery  04/17/2020  8:14 AM  Sign when Signing Visit Attempted to call for pre appointment review of allergies/meds. Voicemail not set up at mobile number, no voicemail at home number. Rang several times, no answer.   Monitoring: [place] PMP: PDMP reviewed during this encounter. Online review of the past 51-monthperiod conducted. Compliant with practice rules and regulations Last UDS on record: No results found for: SUMMARY Will obtain today Risk Assessment Profile: Aberrant behavior: See initial evaluations. None observed or detected today Comorbid factors increasing risk of overdose: See initial evaluation. No additional risks detected today Opioid risk tool (ORT):  Opioid Risk  03/07/2020  Alcohol 0  Illegal Drugs 0  Rx Drugs 0  Alcohol 0  Illegal Drugs 0  Rx Drugs 0  Age between 16-45 years  0  History of Preadolescent Sexual Abuse 0  Psychological Disease 0  Depression 0  Opioid Risk Tool Scoring 0  Opioid Risk Interpretation Low Risk    ORT Scoring interpretation table:  Score <3 = Low Risk for SUD  Score between 4-7 = Moderate Risk for SUD  Score >8 = High Risk for Opioid Abuse   Risk of substance use disorder (SUD): Low  Risk Mitigation Strategies:  Patient Counseling: Covered Patient-Prescriber Agreement (PPA): Present and active  Notification to other healthcare providers: Done  Pharmacologic Plan: Start tramadol 50 mg twice daily as needed             Laboratory Chemistry Profile   Renal Lab Results  Component Value Date   BUN 56 (H) 12/03/2019   CREATININE 2.14 (H) 12/03/2019    BCR 19 05/18/2019   GFRAA 26 (L) 12/03/2019   GFRNONAA 23 (L) 12/03/2019   PROTEINUR 30 (A) 12/01/2019     Electrolytes Lab Results  Component Value Date   NA 143 12/03/2019   K 4.8 12/03/2019   CL 113 (H) 12/03/2019   CALCIUM 8.9 12/03/2019   MG 2.3 12/03/2019     Hepatic Lab Results  Component Value Date   AST 21 12/02/2019   ALT 14 12/02/2019   ALBUMIN 3.1 (L) 12/02/2019   ALKPHOS 73 12/02/2019     ID Lab Results  Component Value Date   SARSCOV2NAA NEGATIVE 01/12/2020     Bone Lab Results  Component Value Date   VD25OH 26.9 (L) 05/18/2019     Endocrine Lab Results  Component Value Date   GLUCOSE 113 (H) 12/03/2019   GLUCOSEU NEGATIVE 12/01/2019   HGBA1C 8.5 03/28/2020   TSH 2.180 07/14/2017     Neuropathy Lab Results  Component Value Date   VITAMINB12 305 02/15/2019   HGBA1C 8.5 03/28/2020     CNS No results found for: COLORCSF, APPEARCSF, RBCCOUNTCSF, WBCCSF, POLYSCSF, LYMPHSCSF, EOSCSF, PROTEINCSF, GLUCCSF, JCVIRUS, CSFOLI, IGGCSF, LABACHR, ACETBL, LABACHR, ACETBL   Inflammation (CRP: Acute  ESR: Chronic) Lab Results  Component Value Date   CRP 1.1 04/14/2018   ESRSEDRATE 14 04/14/2018     Rheumatology Lab Results  Component Value Date   RF <10.0 04/14/2018   ANA Negative 04/14/2018  Coagulation Lab Results  Component Value Date   PLT 248 12/27/2019     Cardiovascular Lab Results  Component Value Date   CKTOTAL 58 01/15/2018   HGB 9.8 (L) 12/27/2019   HCT 33.0 (L) 12/27/2019     Screening Lab Results  Component Value Date   SARSCOV2NAA NEGATIVE 01/12/2020   COVIDSOURCE NASOPHARYNGEAL 06/02/2019     Cancer No results found for: CEA, CA125, LABCA2   Allergens No results found for: ALMOND, APPLE, ASPARAGUS, AVOCADO, BANANA, BARLEY, BASIL, BAYLEAF, GREENBEAN, LIMABEAN, WHITEBEAN, BEEFIGE, REDBEET, BLUEBERRY, BROCCOLI, CABBAGE, MELON, CARROT, CASEIN, CASHEWNUT, CAULIFLOWER, CELERY     Note: Lab results  reviewed.   Recent Diagnostic Imaging Results  ECHOCARDIOGRAM COMPLETE   ECHOCARDIOGRAM REPORT       Patient Name:   IVOREE FELMLEE Date of Exam: 12/02/2019 Medical Rec #:  [id number]       Height: Accession #:    [id number]      Weight: Date of Birth:  1946-01-26       BSA: Patient Age:    74 years        BP:           158/79 mmHg Patient Gender: F               HR:           68 bpm. Exam Location:  [facility]  Procedure: 2D Echo, Color Doppler and Cardiac Doppler  Indications:     I163.9 Stroke   History:         Patient has prior history of Echocardiogram examinations.                  HFeEF, CKD; Risk Factors:Hypertension, Diabetes and                  Dyslipidemia.   Sonographer:     Charmayne Sheer RDCS (AE) Referring Phys:  [place] Diagnosing Phys: Ida Rogue MD    Sonographer Comments: Suboptimal apical window and no subcostal window. IMPRESSIONS   1. Left ventricular ejection fraction, by visual estimation, is 60 to 65%. The left ventricle has normal function. There is moderately increased left ventricular hypertrophy.  2. Left ventricular diastolic parameters are consistent with Grade I diastolic dysfunction (impaired relaxation).  3. The left ventricle has no regional wall motion abnormalities.  4. Global right ventricle has normal systolic function.The right ventricular size is normal. No increase in right ventricular wall thickness.  5. Left atrial size was normal.  6. TR signal is inadequate for assessing pulmonary artery systolic pressure.  FINDINGS  Left Ventricle: Left ventricular ejection fraction, by visual estimation, is 60 to 65%. The left ventricle has normal function. The left ventricle has no regional wall motion abnormalities. There is moderately increased left ventricular hypertrophy.  Left ventricular diastolic parameters are consistent with Grade I diastolic dysfunction (impaired relaxation). Normal left atrial pressure.  Right Ventricle:  The right ventricular size is normal. No increase in right ventricular wall thickness. Global RV systolic function is has normal systolic function.  Left Atrium: Left atrial size was normal in size.  Right Atrium: Right atrial size was normal in size  Pericardium: There is no evidence of pericardial effusion.  Mitral Valve: The mitral valve is normal in structure. No evidence of mitral valve regurgitation. No evidence of mitral valve stenosis by observation.  Tricuspid Valve: The tricuspid valve is normal in structure. Tricuspid valve regurgitation is mild.  Aortic Valve: The aortic valve is  tricuspid. Aortic valve regurgitation is not visualized. The aortic valve is structurally normal, with no evidence of sclerosis or stenosis.  Pulmonic Valve: The pulmonic valve was normal in structure. Pulmonic valve regurgitation is trivial. Pulmonic regurgitation is trivial.  Aorta: The aortic root, ascending aorta and aortic arch are all structurally normal, with no evidence of dilitation or obstruction.  Venous: The inferior vena cava is normal in size with greater than 50% respiratory variability, suggesting right atrial pressure of 3 mmHg.  IAS/Shunts: No atrial level shunt detected by color flow Doppler. There is no evidence of a patent foramen ovale. No ventricular septal defect is seen or detected. There is no evidence of an atrial septal defect.    Ida Rogue MD Electronically signed by Ida Rogue MD Signature Date/Time: 12/02/2019/12:34:57 PM      Final   US Carotid Bilateral (at [facility] and [facility] only) CLINICAL DATA:  Acute CVA. History of hypertension, hyperlipidemia and diabetes.  EXAM: BILATERAL CAROTID DUPLEX ULTRASOUND  TECHNIQUE: Pearline Cables scale imaging, color Doppler and duplex ultrasound were performed of bilateral carotid and vertebral arteries in the neck.  COMPARISON:  None.  FINDINGS: Criteria: Quantification of carotid stenosis is based on velocity parameters that  correlate the residual internal carotid diameter with NASCET-based stenosis levels, using the diameter of the distal internal carotid lumen as the denominator for stenosis measurement.  The following velocity measurements were obtained:  RIGHT  ICA: 102/38 cm/sec  CCA: 00/17 cm/sec  SYSTOLIC ICA/CCA RATIO:  1.5  ECA: 61 cm/sec  LEFT  ICA: 88/35 cm/sec  CCA: 49/44 cm/sec  SYSTOLIC ICA/CCA RATIO:  1.3  ECA: 93 cm/sec  RIGHT CAROTID ARTERY: There is a minimal amount of eccentric echogenic plaque involving the proximal aspect of the right internal carotid artery (image 22), not resulting in elevated peak systolic velocities within the interrogated course of the right internal carotid artery to suggest a hemodynamically significant stenosis.  RIGHT VERTEBRAL ARTERY:  Antegrade Flow  LEFT CAROTID ARTERY: There is a minimal amount of eccentric mixed echogenic plaque within the left carotid bulb (image 47), extending to involve the origin and proximal aspects of the left internal carotid artery (image 54), not resulting in elevated peak systolic velocities within the interrogated course of the left internal carotid artery to suggest a hemodynamically significant stenosis.  LEFT VERTEBRAL ARTERY:  Antegrade flow  IMPRESSION: Minimal amount of bilateral atherosclerotic plaque, not resulting in a hemodynamically significant stenosis within either internal carotid artery.  Electronically Signed   By: Sandi Mariscal M.D.   On: 12/02/2019 09:03  Complexity Note: Imaging results reviewed. Results shared with Ms. Dalesandro, using Layman's terms.                               Meds   Current Outpatient Medications:  .  aspirin 81 MG tablet, Take 81 mg by mouth daily., Disp: , Rfl:  .  Capsaicin 0.05 % GEL, Apply 1 Units topically 4 (four) times daily as needed., Disp: 60 g, Rfl: 1 .  carvedilol (COREG) 3.125 MG tablet, Take 1 tablet (3.125 mg total) by mouth 2 (two) times daily  with a meal., Disp: 180 tablet, Rfl: 3 .  clopidogrel (PLAVIX) 75 MG tablet, TAKE 1 TABLET BY MOUTH  DAILY, Disp: 90 tablet, Rfl: 3 .  DULoxetine (CYMBALTA) 20 MG capsule, Take 1 capsule (20 mg total) by mouth daily., Disp: 30 capsule, Rfl: 1 .  ferrous sulfate 325 (65 FE)  MG EC tablet, Take 1 tablet (325 mg total) by mouth 3 (three) times daily with meals., Disp: 120 tablet, Rfl: 3 .  furosemide (LASIX) 40 MG tablet, Take 40 mg by mouth daily., Disp: , Rfl:  .  HUMALOG KWIKPEN 100 UNIT/ML KiwkPen, TK 12 UNITS BEFORE LUNCH AND 8 UNITS BEFORE SUPPER., Disp: , Rfl: 11 .  insulin detemir (LEVEMIR) 100 UNIT/ML injection, Inject 10 Units into the skin every morning. , Disp: , Rfl:  .  olmesartan (BENICAR) 40 MG tablet, Take 40 mg by mouth daily., Disp: , Rfl:  .  omeprazole (PRILOSEC) 20 MG capsule, TAKE 1 CAPSULE BY MOUTH  TWICE DAILY, Disp: 180 capsule, Rfl: 3 .  pioglitazone (ACTOS) 30 MG tablet, Take 30 mg by mouth daily., Disp: , Rfl:  .  rosuvastatin (CRESTOR) 5 MG tablet, Take 1 tablet (5 mg total) by mouth daily., Disp: 90 tablet, Rfl: 3 .  Vitamin D, Cholecalciferol, 50 MCG (2000 UT) CAPS, Take 2,000 Units by mouth daily., Disp: , Rfl:  .  traMADol (ULTRAM) 50 MG tablet, Take 1 tablet (50 mg total) by mouth every 12 (twelve) hours as needed for severe pain. Month last 30 days., Disp: 60 tablet, Rfl: 1  ROS  Constitutional: Denies any fever or chills Gastrointestinal: No reported hemesis, hematochezia, vomiting, or acute GI distress Musculoskeletal: Denies any acute onset joint swelling, redness, loss of ROM, or weakness Neurological: No reported episodes of acute onset apraxia, aphasia, dysarthria, agnosia, amnesia, paralysis, loss of coordination, or loss of consciousness  Allergies  Ms. Michie is allergic to atorvastatin; gabapentin; pravastatin; pregabalin; and trulicity [dulaglutide].  PFSH  Drug: Ms. Whisman  reports no history of drug use. Alcohol:  reports no history of alcohol  use. Tobacco:  reports that she has never smoked. She has never used smokeless tobacco. Medical:  has a past medical history of (HFpEF) heart failure with preserved ejection fraction ([place]), CKD (chronic kidney disease), stage III, Diabetes mellitus without complication ([place]), Hyperlipidemia, Hypertension, and Stroke ([place]). Surgical: Ms. Roa  has a past surgical history that includes Tubal ligation; Colonoscopy (12/15/2006); Colonoscopy with propofol (N/A, 06/06/2019); Colonoscopy with propofol (N/A, 01/16/2020); and polypectomy (N/A, 01/16/2020). Family: family history includes Diabetes in her brother, brother, mother, and sister; Gout in her son; Heart attack in her sister; Hyperlipidemia in her father; Hypertension in her father; Kidney disease in her son; Lymphoma in her sister; Stroke in her mother.  Constitutional Exam  General appearance: Well nourished, well developed, and well hydrated. In no apparent acute distress Vitals:   04/18/20 1401  BP: 122/75  Pulse: 96  Resp: 16  Temp: (!) 97.3 F (36.3 C)  TempSrc: Temporal  SpO2: 99%  Weight: 177 lb (80.3 kg)  Height: _0  (1.702 m)   BMI Assessment: Estimated body mass index is 27.72 kg/m as calculated from the following:   Height as of this encounter: _1  (1.702 m).   Weight as of this encounter: 177 lb (80.3 kg).  BMI interpretation table: BMI level Category Range association with higher incidence of chronic pain  <18 kg/m2 Underweight   18.5-24.9 kg/m2 Ideal body weight   25-29.9 kg/m2 Overweight Increased incidence by 20%  30-34.9 kg/m2 Obese (Class I) Increased incidence by 68%  35-39.9 kg/m2 Severe obesity (Class II) Increased incidence by 136%  >40 kg/m2 Extreme obesity (Class III) Increased incidence by 254%   Patient's current BMI Ideal Body weight  Body mass index is 27.72 kg/m. Ideal body weight: 61.6 kg (135 lb 12.9  oz) Adjusted ideal body weight: 69.1 kg (152 lb 4.5 oz)   BMI Readings from Last 4 Encounters:   04/18/20 27.72 kg/m  04/10/20 27.41 kg/m  03/07/20 27.72 kg/m  01/16/20 27.72 kg/m   Wt Readings from Last 4 Encounters:  04/18/20 177 lb (80.3 kg)  04/10/20 175 lb (79.4 kg)  03/07/20 177 lb (80.3 kg)  01/16/20 177 lb (80.3 kg)    Psych/Mental status: Alert, oriented x 3 (person, place, & time)       Eyes: PERLA Respiratory: No evidence of acute respiratory distress  Cervical Spine Exam  Skin & Axial Inspection: No masses, redness, edema, swelling, or associated skin lesions Alignment: Symmetrical Functional ROM: Unrestricted ROM      Stability: No instability detected Muscle Tone/Strength: Functionally intact. No obvious neuro-muscular anomalies detected. Sensory (Neurological): Unimpaired Palpation: No palpable anomalies              Upper Extremity (UE) Exam    Side: Right upper extremity  Side: Left upper extremity  Skin & Extremity Inspection: Skin color, temperature, and hair growth are WNL. No peripheral edema or cyanosis. No masses, redness, swelling, asymmetry, or associated skin lesions. No contractures.  Skin & Extremity Inspection: Skin color, temperature, and hair growth are WNL. No peripheral edema or cyanosis. No masses, redness, swelling, asymmetry, or associated skin lesions. No contractures.  Functional ROM: Unrestricted ROM          Functional ROM: Unrestricted ROM          Muscle Tone/Strength: Functionally intact. No obvious neuro-muscular anomalies detected.  Muscle Tone/Strength: Functionally intact. No obvious neuro-muscular anomalies detected.  Sensory (Neurological): Unimpaired          Sensory (Neurological): Unimpaired          Palpation: No palpable anomalies              Palpation: No palpable anomalies              Provocative Test(s):  Phalen's test: deferred Tinel's test: deferred Apley's scratch test (touch opposite shoulder):  Action 1 (Across chest): deferred Action 2 (Overhead): deferred Action 3 (LB reach): deferred   Provocative  Test(s):  Phalen's test: deferred Tinel's test: deferred Apley's scratch test (touch opposite shoulder):  Action 1 (Across chest): deferred Action 2 (Overhead): deferred Action 3 (LB reach): deferred    Thoracic Spine Area Exam  Skin & Axial Inspection: No masses, redness, or swelling Alignment: Symmetrical Functional ROM: Unrestricted ROM Stability: No instability detected Muscle Tone/Strength: Functionally intact. No obvious neuro-muscular anomalies detected. Sensory (Neurological): Neuropathic pain pattern Muscle strength & Tone: No palpable anomalies  Lumbar Exam  Skin & Axial Inspection: No masses, redness, or swelling Alignment: Symmetrical Functional ROM: Decreased ROM affecting both sides Stability: No instability detected Muscle Tone/Strength: Functionally intact. No obvious neuro-muscular anomalies detected. Sensory (Neurological): Neuropathic pain pattern Positive Patrick's on the right unable to perform on the left Gait & Posture Assessment  Ambulation: Patient came in today in a wheel chair Gait: Significantly limited. Dependent on assistive device to ambulate Posture: Difficulty standing up straight, due to pain   Lower Extremity Exam    Side: Right lower extremity  Side: Left lower extremity  Stability: No instability observed          Stability: No instability observed          Skin & Extremity Inspection: Skin color, temperature, and hair growth are WNL. No peripheral edema or cyanosis. No masses, redness, swelling, asymmetry, or associated skin lesions.  No contractures.  Skin & Extremity Inspection: Skin color, temperature, and hair growth are WNL. No peripheral edema or cyanosis. No masses, redness, swelling, asymmetry, or associated skin lesions. No contractures.  Functional ROM: Pain restricted ROM for hip and knee joints          Functional ROM: Pain restricted ROM for hip and knee joints          Muscle Tone/Strength: Functionally intact. No obvious  neuro-muscular anomalies detected.  Muscle Tone/Strength: Functionally intact. No obvious neuro-muscular anomalies detected.  Sensory (Neurological): Musculoskeletal pain pattern        Sensory (Neurological): Musculoskeletal pain pattern        DTR: Patellar: deferred today Achilles: deferred today Plantar: deferred today  DTR: Patellar: deferred today Achilles: deferred today Plantar: deferred today  Palpation: No palpable anomalies  Palpation: No palpable anomalies   Assessment   Status Diagnosis  Persistent Controlled Controlled 1. Post herpetic neuralgia   2. Chronic pain syndrome   3. Sacroiliac joint pain   4. Acute cerebrovascular accident (CVA) ([place])   5. Chronic kidney disease, stage 4, severely decreased GFR ([facility])   6. Neuropathic pain      Updated Problems: Problem  Chronic Pain Syndrome  Sacroiliac Joint Pain    Plan of Care  Pharmacotherapy (Medications Ordered): Meds ordered this encounter  Medications  . traMADol (ULTRAM) 50 MG tablet    Sig: Take 1 tablet (50 mg total) by mouth every 12 (twelve) hours as needed for severe pain. Month last 30 days.    Dispense:  60 tablet    Refill:  1    [place] STOP ACT - Not applicable. Fill one day early if pharmacy is closed on scheduled refill date.   Medications administered today: Tonya [name] had no medications administered during this visit.  Orders:  Orders Placed This Encounter  Procedures  . DG Si Joints    Standing Status:   Future    Standing Expiration Date:   07/19/2020    Order Specific Question:   Reason for Exam (SYMPTOM  OR DIAGNOSIS REQUIRED)    Answer:   Sacroiliac joint pain    Order Specific Question:   Preferred imaging location?    Answer:   [facility]    Order Specific Question:   Call Results- Best Contact Number?    Answer:   [phone] [facility])  . Compliance Drug Analysis, Ur    Volume: 30 ml(s). Minimum 3 ml of urine is needed. Document temperature of fresh  sample. Indications: Long term (current) use of opiate analgesic (Z79.891) Test#: [phone] (Comprehensive Profile)    Lab Orders     Compliance Drug Analysis, Ur  Imaging Orders     DG Si Joints Planned follow-up:   Return in about 8 weeks (around 06/13/2020) for Medication Management, in person.     Postherpetic neuralgia: Failed gabapentin, Lyrica, Cymbalta. Trial of tramadol.   Recent Visits Date Type Provider Dept  03/07/20 Office Visit Gillis Santa, MD [facility]  Showing recent visits within past 90 days and meeting all other requirements   Today's Visits Date Type Provider Dept  04/18/20 Telemedicine Gillis Santa, MD [facility]  Showing today's visits and meeting all other requirements   Future Appointments Date Type Provider Dept  06/12/20 Appointment Gillis Santa, MD [facility]  Showing future appointments within next 90 days and meeting all other requirements   Primary Care Physician: [name], NP [facility] [facility]  Facility Note by: Gillis Santa, MD Date: 04/18/2020; Time: 2:50 PM  Note: This dictation was prepared with Dragon dictation. Any transcriptional errors that may result from this process are unintentional.

## 2020-04-20 ENCOUNTER — Telehealth: Payer: Self-pay | Admitting: General Practice

## 2020-04-20 ENCOUNTER — Ambulatory Visit (INDEPENDENT_AMBULATORY_CARE_PROVIDER_SITE_OTHER): Payer: Medicare Other | Admitting: General Practice

## 2020-04-20 DIAGNOSIS — I1310 Hypertensive heart and chronic kidney disease without heart failure, with stage 1 through stage 4 chronic kidney disease, or unspecified chronic kidney disease: Secondary | ICD-10-CM

## 2020-04-20 DIAGNOSIS — E1169 Type 2 diabetes mellitus with other specified complication: Secondary | ICD-10-CM

## 2020-04-20 DIAGNOSIS — I5032 Chronic diastolic (congestive) heart failure: Secondary | ICD-10-CM

## 2020-04-20 DIAGNOSIS — E1140 Type 2 diabetes mellitus with diabetic neuropathy, unspecified: Secondary | ICD-10-CM

## 2020-04-20 DIAGNOSIS — N184 Chronic kidney disease, stage 4 (severe): Secondary | ICD-10-CM

## 2020-04-20 DIAGNOSIS — E785 Hyperlipidemia, unspecified: Secondary | ICD-10-CM

## 2020-04-20 DIAGNOSIS — E1165 Type 2 diabetes mellitus with hyperglycemia: Secondary | ICD-10-CM

## 2020-04-20 NOTE — Chronic Care Management (AMB) (Signed)
Chronic Care Management   Follow Up Note   04/20/2020 Name: THEIA DEZEEUW MRN: [id number] DOB: 10/16/1946  Referred by: [name], NP Reason for referral : Chronic Care Management (RNCM Chronic Disease Management and care coordination needs: HF, HLD, DM2, HTN, and care coordination needs)   BRE PECINA is a 74 y.o. year old female who is a primary care patient of Cannady, [name], NP. The CCM team was consulted for assistance with chronic disease management and care coordination needs.    Review of patient status, including review of consultants reports, relevant laboratory and other test results, and collaboration with appropriate care team members and the patient's provider was performed as part of comprehensive patient evaluation and provision of chronic care management services.    SDOH (Social Determinants of Health) assessments performed: Yes See Care Plan activities for detailed interventions related to SDOH)  SDOH Interventions     Most Recent Value  SDOH Interventions  SDOH Interventions for the Following Domains  Physical Activity  Physical Activity Interventions  Other (Comments) [the patient does not do structured activity]       Outpatient Encounter Medications as of 04/20/2020  Medication Sig Note   aspirin 81 MG tablet Take 81 mg by mouth daily.    Capsaicin 0.05 % GEL Apply 1 Units topically 4 (four) times daily as needed.    carvedilol (COREG) 3.125 MG tablet Take 1 tablet (3.125 mg total) by mouth 2 (two) times daily with a meal.    clopidogrel (PLAVIX) 75 MG tablet TAKE 1 TABLET BY MOUTH  DAILY    DULoxetine (CYMBALTA) 20 MG capsule Take 1 capsule (20 mg total) by mouth daily.    ferrous sulfate 325 (65 FE) MG EC tablet Take 1 tablet (325 mg total) by mouth 3 (three) times daily with meals. 01/11/2020: Taking twice daily    furosemide (LASIX) 40 MG tablet Take 40 mg by mouth daily.    HUMALOG KWIKPEN 100 UNIT/ML KiwkPen TK 12 UNITS BEFORE LUNCH  AND 8 UNITS BEFORE SUPPER. 03/09/2020: 6 units   insulin detemir (LEVEMIR) 100 UNIT/ML injection Inject 10 Units into the skin every morning.     olmesartan (BENICAR) 40 MG tablet Take 40 mg by mouth daily.    omeprazole (PRILOSEC) 20 MG capsule TAKE 1 CAPSULE BY MOUTH  TWICE DAILY    pioglitazone (ACTOS) 30 MG tablet Take 30 mg by mouth daily.    rosuvastatin (CRESTOR) 5 MG tablet Take 1 tablet (5 mg total) by mouth daily.    traMADol (ULTRAM) 50 MG tablet Take 1 tablet (50 mg total) by mouth every 12 (twelve) hours as needed for severe pain. Month last 30 days.    Vitamin D, Cholecalciferol, 50 MCG (2000 UT) CAPS Take 2,000 Units by mouth daily.    No facility-administered encounter medications on file as of 04/20/2020.     Objective:  BP Readings from Last 3 Encounters:  04/18/20 122/70  04/18/20 122/75  04/10/20 132/86    Goals Addressed            This Visit's Progress    RNCM: I have a lot of health problems but I am doing okay       Current Barriers:   Chronic Disease Management support, education, and care coordination needs related to CHF, HTN, HLD, DMII, and CKD Stage 4  Clinical Goal(s) related to CHF, HTN, HLD, DMII, and CKD Stage 4 :  Over the next 120 days, patient will:   Work  with the care management team to address educational, disease management, and care coordination needs   Begin or continue self health monitoring activities as directed today Measure and record cbg (blood glucose) bid times daily, Measure and record blood pressure 5 times per week, and Measure and record weight daily- currently does not have a scale  Call provider office for new or worsened signs and symptoms Blood glucose findings outside established parameters, Blood pressure findings outside established parameters, Weight outside established parameters, Shortness of breath, and New or worsened symptom related to CKD 4  Call care management team with questions or concerns  Verbalize  basic understanding of patient centered plan of care established today  Interventions related to CHF, HTN, HLD, DMII, and CKD Stage 4 :   Evaluation of current treatment plans and patient's adherence to plan as established by provider  Assessed patient understanding of disease states.  The patient verbalized understanding of disease processes.  States compliance with medications.   Assessed patient's education and care coordination needs  Provided disease specific education to patient. Education on following a heart healthy/ADA diet. The patient states she does not use salt.  Her hemoglobin A1C is 8.5 and indicates uncontrolled diabetes.  Education on the benefits of following a heart healthy/ADA diet.  Will send educational material to the patient concerning carb modified diet and also DASH diet.   Collaborated with appropriate clinical care team members regarding patient needs.  The patient is currently working with the pharmacist with medications management.  The patient endorses compliance. States she is taking her insulin and other medications as directed. The MD has filled out paperwork for her to get the Dexcom continuous glucose meter.   Assessed the patients ability to weigh daily and the patient verbalized she does not have a scale, the patient has checked with her insurance company and still does not have a scale.  The RNCM will utilize resources to get the patient an operational scale.  Explained the importance of daily weights due to her chronic condition of heart failure and the need to call the provider for >2 pound weight gain over night or >5 in one week. The patient verbalized understanding.   Educated on the importance of elevating feet and legs when sitting and wearing ted hose or socks (patient did not have compression socks on at the time of the call). The patient verbalized she does have issues with swelling in her feet and legs.  The patient states that her swelling is not as  bad when she wears sketchers shoes with the memory foam.  Discussed the importance of wearing ted hose to promote circulation. The patient verbalized understanding.   Evaluation of blood pressure checks. The patient states she does check her blood pressure sometimes but could not give the RNCM any readings. She provided the reading from the pain clinic on Wednesday and that was 122/70.  Education on taking blood pressure on a regular basis and writing values down for RNCM, CCM team and pcp so a pattern could be determined.  Education on elevated blood pressure putting the patient at increased risk and stroke.   Education on the importance of following the recommendations of her providers due to the multiple chronic conditions she has. Expressed concern over her health and well being.    Review of upcoming appointments. Will talk to the pharmacist in May and then the pcp appointment is 8/11 at 11 am.    Patient Self Care Activities related to CHF, HTN, HLD,  DMII, and CKD Stage 4 :   Patient is unable to independently self-manage chronic health conditions  Unable to weigh daily due to not having a scale  Please see past updates related to this goal by clicking on the "Past Updates" button in the selected goal          Plan:   The care management team will reach out to the patient again over the next 60  days.    [name] RN, MSN, [facility] Mobile: [phone]

## 2020-04-20 NOTE — Patient Instructions (Addendum)
Visit Information  Goals Addressed            This Visit's Progress   . RNCM: I have a lot of health problems but I am doing okay       Current Barriers:  . Chronic Disease Management support, education, and care coordination needs related to CHF, HTN, HLD, DMII, and CKD Stage 4  Clinical Goal(s) related to CHF, HTN, HLD, DMII, and CKD Stage 4 :  Over the next 120 days, patient will:  . Work with the care management team to address educational, disease management, and care coordination needs  . Begin or continue self health monitoring activities as directed today Measure and record cbg (blood glucose) bid times daily, Measure and record blood pressure 5 times per week, and Measure and record weight daily- currently does not have a scale . Call provider office for new or worsened signs and symptoms Blood glucose findings outside established parameters, Blood pressure findings outside established parameters, Weight outside established parameters, Shortness of breath, and New or worsened symptom related to CKD 4 . Call care management team with questions or concerns . Verbalize basic understanding of patient centered plan of care established today  Interventions related to CHF, HTN, HLD, DMII, and CKD Stage 4 :  . Evaluation of current treatment plans and patient's adherence to plan as established by provider . Assessed patient understanding of disease states.  The patient verbalized understanding of disease processes.  States compliance with medications.  . Assessed patient's education and care coordination needs . Provided disease specific education to patient. Education on following a heart healthy/ADA diet. The patient states she does not use salt.  Her hemoglobin A1C is 8.5 and indicates uncontrolled diabetes.  Education on the benefits of following a heart healthy/ADA diet.  Will send educational material to the patient concerning carb modified diet and also DASH diet.  Nash Dimmer  with appropriate clinical care team members regarding patient needs.  The patient is currently working with the pharmacist with medications management.  The patient endorses compliance. States she is taking her insulin and other medications as directed. The MD has filled out paperwork for her to get the Dexcom continuous glucose meter.  . Assessed the patients ability to weigh daily and the patient verbalized she does not have a scale, the patient has checked with her insurance company and still does not have a scale.  The RNCM will utilize resources to get the patient an operational scale.  Explained the importance of daily weights due to her chronic condition of heart failure and the need to call the provider for >2 pound weight gain over night or >5 in one week. The patient verbalized understanding.  . Educated on the importance of elevating feet and legs when sitting and wearing ted hose or socks (patient did not have compression socks on at the time of the call). The patient verbalized she does have issues with swelling in her feet and legs.  The patient states that her swelling is not as bad when she wears sketchers shoes with the memory foam.  Discussed the importance of wearing ted hose to promote circulation. The patient verbalized understanding.  . Evaluation of blood pressure checks. The patient states she does check her blood pressure sometimes but could not give the RNCM any readings. She provided the reading from the pain clinic on Wednesday and that was 122/70.  Education on taking blood pressure on a regular basis and writing values down for RNCM, CCM  team and pcp so a pattern could be determined.  Education on elevated blood pressure putting the patient at increased risk and stroke.  . Education on the importance of following the recommendations of her providers due to the multiple chronic conditions she has. Expressed concern over her health and well being.   . Review of upcoming appointments.  Will talk to the pharmacist in May and then the pcp appointment is 8/11 at 11 am.    Patient Self Care Activities related to CHF, HTN, HLD, DMII, and CKD Stage 4 :  . Patient is unable to independently self-manage chronic health conditions . Unable to weigh daily due to not having a scale  Please see past updates related to this goal by clicking on the "Past Updates" button in the selected goal         Patient verbalizes understanding of instructions provided today.   The care management team will reach out to the patient again over the next 60 days.   [name] RN, MSN, [facility] Mobile: [phone]  Heart Failure Eating Plan Heart failure, also called congestive heart failure, occurs when your heart does not pump blood well enough to meet your body's needs for oxygen-rich blood. Heart failure is a long-term (chronic) condition. Living with heart failure can be challenging. However, following your health care provider's instructions about a healthy lifestyle and working with a diet and nutrition specialist (dietitian) to choose the right foods may help to improve your symptoms. What are tips for following this plan? Reading food labels  Check food labels for the amount of sodium per serving. Choose foods that have less than 140 mg (milligrams) of sodium in each serving.  Check food labels for the number of calories per serving. This is important if you need to limit your daily calorie intake to lose weight.  Check food labels for the serving size. If you eat more than one serving, you will be eating more sodium and calories than what is listed on the label.  Look for foods that are labeled as "sodium-free," "very low sodium," or "low sodium." ? Foods labeled as "reduced sodium" or "lightly salted" may still have more sodium than what is recommended for you. Cooking  Avoid adding salt when  cooking. Ask your health care provider or dietitian before using salt substitutes.  Season food with salt-free seasonings, spices, or herbs. Check the label of seasoning mixes to make sure they do not contain salt.  Cook with heart-healthy oils, such as olive, canola, soybean, or sunflower oil.  Do not fry foods. Cook foods using low-fat methods, such as baking, boiling, grilling, and broiling.  Limit unhealthy fats when cooking by: ? Removing the skin from poultry, such as chicken. ? Removing all visible fats from meats. ? Skimming the fat off from stews, soups, and gravies before serving them. Meal planning   Limit your intake of: ? Processed, canned, or pre-packaged foods. ? Foods that are high in trans fat, such as fried foods. ? Sweets, desserts, sugary drinks, and other foods with added sugar. ? Full-fat dairy products, such as whole milk.  Eat a balanced diet that includes: ? 4-5 servings of fruit each day and 4-5 servings of vegetables each day. At each meal, try to fill half of your plate with fruits and vegetables. ? Up to 6-8 servings of whole grains each day. ? Up to 2 servings of lean meat, poultry, or  fish each day. One serving of meat is equal to 3 oz. This is about the same size as a deck of cards. ? 2 servings of low-fat dairy each day. ? Heart-healthy fats. Healthy fats called omega-3 fatty acids are found in foods such as flaxseed and cold-water fish like sardines, salmon, and mackerel.  Aim to eat 25-35 g (grams) of fiber a day. Foods that are high in fiber include apples, broccoli, carrots, beans, peas, and whole grains.  Do not add salt or condiments that contain salt (such as soy sauce) to foods before eating.  When eating at a restaurant, ask that your food be prepared with less salt or no salt, if possible.  Try to eat 2 or more vegetarian meals each week.  Eat more home-cooked food and eat less restaurant, buffet, and fast food. General information  Do  not eat more than 2,300 mg of salt (sodium) a day. The amount of sodium that is recommended for you may be lower, depending on your condition.  Maintain a healthy body weight as directed. Ask your health care provider what a healthy weight is for you. ? Check your weight every day. ? Work with your health care provider and dietitian to make a plan that is right for you to lose weight or maintain your current weight.  Limit how much fluid you drink. Ask your health care provider or dietitian how much fluid you can have each day.  Limit or avoid alcohol as told by your health care provider or dietitian. Recommended foods The items listed may not be a complete list. Talk with your dietitian about what dietary choices are best for you. Fruits All fresh, frozen, and canned fruits. Dried fruits, such as raisins, prunes, and cranberries. Vegetables All fresh vegetables. Vegetables that are frozen without sauce or added salt. Low-sodium or sodium-free canned vegetables. Grains Bread with less than 80 mg of sodium per slice. Whole-wheat pasta, quinoa, and brown rice. Oats and oatmeal. Barley. Hackberry. Grits and cream of wheat. Whole-grain and whole-wheat cold cereal. Meats and other protein foods Lean cuts of meat. Skinless chicken and [place]. Fish with high omega-3 fatty acids, such as salmon, sardines, and other cold-water fishes. Eggs. Dried beans, peas, and edamame. Unsalted nuts and nut butters. Dairy Low-fat or nonfat (skim) milk and dried milk. Rice milk, soy milk, and almond milk. Low-fat or nonfat yogurt. Small amounts of reduced-sodium block cheese. Low-sodium cottage cheese. Fats and oils Olive, canola, soybean, flaxseed, or sunflower oil. Avocado. Sweets and desserts Apple sauce. Granola bars. Sugar-free pudding and gelatin. Frozen fruit bars. Seasoning and other foods Fresh and dried herbs. Lemon or lime juice. Vinegar. Low-sodium ketchup. Salt-free marinades, salad dressings, sauces,  and seasonings. The items listed above may not be a complete list of foods and beverages you can eat. Contact a dietitian for more information. Foods to avoid The items listed may not be a complete list. Talk with your dietitian about what dietary choices are best for you. Fruits Fruits that are dried with sodium-containing preservatives. Vegetables Canned vegetables. Frozen vegetables with sauce or seasonings. Creamed vegetables. [place] fries. Onion rings. Pickled vegetables and sauerkraut. Grains Bread with more than 80 mg of sodium per slice. Hot or cold cereal with more than 140 mg sodium per serving. Salted pretzels and crackers. Pre-packaged breadcrumbs. Bagels, croissants, and biscuits. Meats and other protein foods Ribs and chicken wings. Bacon, ham, pepperoni, bologna, salami, and packaged luncheon meats. Hot dogs, bratwurst, and sausage. Canned meat. Smoked meat  and fish. Salted nuts and seeds. Dairy Whole milk, half-and-half, and cream. Buttermilk. Processed cheese, cheese spreads, and cheese curds. Regular cottage cheese. Feta cheese. Shredded cheese. String cheese. Fats and oils Butter, lard, shortening, ghee, and bacon fat. Canned and packaged gravies. Seasoning and other foods Onion salt, garlic salt, table salt, and sea salt. Marinades. Regular salad dressings. Relishes, pickles, and olives. Meat flavorings and tenderizers, and bouillon cubes. Horseradish, ketchup, and mustard. Worcestershire sauce. Teriyaki sauce, soy sauce (including reduced sodium). Hot sauce and Tabasco sauce. Steak sauce, fish sauce, oyster sauce, and cocktail sauce. Taco seasonings. Barbecue sauce. Tartar sauce. The items listed above may not be a complete list of foods and beverages you should avoid. Contact a dietitian for more information. Summary  A heart failure eating plan includes changes that limit your intake of sodium and unhealthy fat, and it may help you lose weight or maintain a healthy weight.  Your health care provider may also recommend limiting how much fluid you drink.  Most people with heart failure should eat no more than 2,300 mg of salt (sodium) a day. The amount of sodium that is recommended for you may be lower, depending on your condition.  Contact your health care provider or dietitian before making any major changes to your diet. This information is not intended to replace advice given to you by your health care provider. Make sure you discuss any questions you have with your health care provider. Document Revised: 01/27/2019 Document Reviewed: 04/17/2017 Elsevier Patient Education  [place].  Heart Failure Action Plan A heart failure action plan helps you understand what to do when you have symptoms of heart failure. Follow the plan that was created by you and your health care provider. Review your plan each time you visit your health care provider. Red zone These signs and symptoms mean you should get medical help right away:  You have trouble breathing when resting.  You have a dry cough that is getting worse.  You have swelling or pain in your legs or abdomen that is getting worse.  You suddenly gain more than 2-3 lb (0.9-1.4 kg) in a day, or more than 5 lb (2.3 kg) in one week. This amount may be more or less depending on your condition.  You have trouble staying awake or you feel confused.  You have chest pain.  You do not have an appetite.  You pass out. If you experience any of these symptoms:  Call your local emergency services (911 in the [place] right away or seek help at the emergency department of the nearest hospital. Yellow zone These signs and symptoms mean your condition may be getting worse and you should make some changes:  You have trouble breathing when you are active or you need to sleep with extra pillows.  You have swelling in your legs or abdomen.  You gain 2-3 lb (0.9-1.4 kg) in one day, or 5 lb (2.3 kg) in one week. This  amount may be more or less depending on your condition.  You get tired easily.  You have trouble sleeping.  You have a dry cough. If you experience any of these symptoms:  Contact your health care provider within the next day.  Your health care provider may adjust your medicines. Green zone These signs mean you are doing well and can continue what you are doing:  You do not have shortness of breath.  You have very little swelling or no new swelling.  Your weight  is stable (no gain or loss).  You have a normal activity level.  You do not have chest pain or any other new symptoms. Follow these instructions at home:  Take over-the-counter and prescription medicines only as told by your health care provider.  Weigh yourself daily. Your target weight is __________ lb (__________ kg). ? Call your health care provider if you gain more than __________ lb (__________ kg) in a day, or more than __________ lb (__________ kg) in one week.  Eat a heart-healthy diet. Work with a diet and nutrition specialist (dietitian) to create an eating plan that is best for you.  Keep all follow-up visits as told by your health care provider. This is important. Where to find more information  American Heart Association: www.heart.org Summary  Follow the action plan that was created by you and your health care provider.  Get help right away if you have any symptoms in the Red zone. This information is not intended to replace advice given to you by your health care provider. Make sure you discuss any questions you have with your health care provider. Document Revised: 11/13/2017 Document Reviewed: 01/10/2017 Elsevier Patient Education  2020 [place] for Diabetes Mellitus, Adult  Carbohydrate counting is a method of keeping track of how many carbohydrates you eat. Eating carbohydrates naturally increases the amount of sugar (glucose) in the blood. Counting how many  carbohydrates you eat helps keep your blood glucose within normal limits, which helps you manage your diabetes (diabetes mellitus). It is important to know how many carbohydrates you can safely have in each meal. This is different for every person. A diet and nutrition specialist (registered dietitian) can help you make a meal plan and calculate how many carbohydrates you should have at each meal and snack. Carbohydrates are found in the following foods:  Grains, such as breads and cereals.  Dried beans and soy products.  Starchy vegetables, such as potatoes, peas, and corn.  Fruit and fruit juices.  Milk and yogurt.  Sweets and snack foods, such as cake, cookies, candy, chips, and soft drinks. How do I count carbohydrates? There are two ways to count carbohydrates in food. You can use either of the methods or a combination of both. Reading "Nutrition Facts" on packaged food The "Nutrition Facts" list is included on the labels of almost all packaged foods and beverages in the [place] It includes:  The serving size.  Information about nutrients in each serving, including the grams (g) of carbohydrate per serving. To use the "Nutrition Facts":  Decide how many servings you will have.  Multiply the number of servings by the number of carbohydrates per serving.  The resulting number is the total amount of carbohydrates that you will be having. Learning standard serving sizes of other foods When you eat carbohydrate foods that are not packaged or do not include "Nutrition Facts" on the label, you need to measure the servings in order to count the amount of carbohydrates:  Measure the foods that you will eat with a food scale or measuring cup, if needed.  Decide how many standard-size servings you will eat.  Multiply the number of servings by 15. Most carbohydrate-rich foods have about 15 g of carbohydrates per serving. ? For example, if you eat 8 oz (170 g) of strawberries, you will  have eaten 2 servings and 30 g of carbohydrates (2 servings x 15 g = 30 g).  For foods that have more than one food mixed,  such as soups and casseroles, you must count the carbohydrates in each food that is included. The following list contains standard serving sizes of common carbohydrate-rich foods. Each of these servings has about 15 g of carbohydrates:   hamburger bun or  English muffin.   oz (15 mL) syrup.   oz (14 g) jelly.  1 slice of bread.  1 six-inch tortilla.  3 oz (85 g) cooked rice or pasta.  4 oz (113 g) cooked dried beans.  4 oz (113 g) starchy vegetable, such as peas, corn, or potatoes.  4 oz (113 g) hot cereal.  4 oz (113 g) mashed potatoes or  of a large baked potato.  4 oz (113 g) canned or frozen fruit.  4 oz (120 mL) fruit juice.  4-6 crackers.  6 chicken nuggets.  6 oz (170 g) unsweetened dry cereal.  6 oz (170 g) plain fat-free yogurt or yogurt sweetened with artificial sweeteners.  8 oz (240 mL) milk.  8 oz (170 g) fresh fruit or one small piece of fruit.  24 oz (680 g) popped popcorn. Example of carbohydrate counting Sample meal  3 oz (85 g) chicken breast.  6 oz (170 g) brown rice.  4 oz (113 g) corn.  8 oz (240 mL) milk.  8 oz (170 g) strawberries with sugar-free whipped topping. Carbohydrate calculation 1. Identify the foods that contain carbohydrates: ? Rice. ? Corn. ? Milk. ? Strawberries. 2. Calculate how many servings you have of each food: ? 2 servings rice. ? 1 serving corn. ? 1 serving milk. ? 1 serving strawberries. 3. Multiply each number of servings by 15 g: ? 2 servings rice x 15 g = 30 g. ? 1 serving corn x 15 g = 15 g. ? 1 serving milk x 15 g = 15 g. ? 1 serving strawberries x 15 g = 15 g. 4. Add together all of the amounts to find the total grams of carbohydrates eaten: ? 30 g + 15 g + 15 g + 15 g = 75 g of carbohydrates total. Summary  Carbohydrate counting is a method of keeping track of how  many carbohydrates you eat.  Eating carbohydrates naturally increases the amount of sugar (glucose) in the blood.  Counting how many carbohydrates you eat helps keep your blood glucose within normal limits, which helps you manage your diabetes.  A diet and nutrition specialist (registered dietitian) can help you make a meal plan and calculate how many carbohydrates you should have at each meal and snack. This information is not intended to replace advice given to you by your health care provider. Make sure you discuss any questions you have with your health care provider. Document Revised: 06/25/2017 Document Reviewed: 05/14/2016 Elsevier Patient Education  [place] DASH stands for "Dietary Approaches to Stop Hypertension." The DASH eating plan is a healthy eating plan that has been shown to reduce high blood pressure (hypertension). It may also reduce your risk for type 2 diabetes, heart disease, and stroke. The DASH eating plan may also help with weight loss. What are tips for following this plan?  General guidelines Avoid eating more than 2,300 mg (milligrams) of salt (sodium) a day. If you have hypertension, you may need to reduce your sodium intake to 1,500 mg a day. Limit alcohol intake to no more than 1 drink a day for nonpregnant women and 2 drinks a day for men. One drink equals 12 oz of beer, 5 oz  of wine, or 1 oz of hard liquor. Work with your health care provider to maintain a healthy body weight or to lose weight. Ask what an ideal weight is for you. Get at least 30 minutes of exercise that causes your heart to beat faster (aerobic exercise) most days of the week. Activities may include walking, swimming, or biking. Work with your health care provider or diet and nutrition specialist (dietitian) to adjust your eating plan to your individual calorie needs. Reading food labels  Check food labels for the amount of sodium per serving. Choose foods with  less than 5 percent of the Daily Value of sodium. Generally, foods with less than 300 mg of sodium per serving fit into this eating plan. To find whole grains, look for the word "whole" as the first word in the ingredient list. Shopping Buy products labeled as "low-sodium" or "no salt added." Buy fresh foods. Avoid canned foods and premade or frozen meals. Cooking Avoid adding salt when cooking. Use salt-free seasonings or herbs instead of table salt or sea salt. Check with your health care provider or pharmacist before using salt substitutes. Do not fry foods. Cook foods using healthy methods such as baking, boiling, grilling, and broiling instead. Cook with heart-healthy oils, such as olive, canola, soybean, or sunflower oil. Meal planning Eat a balanced diet that includes: 5 or more servings of fruits and vegetables each day. At each meal, try to fill half of your plate with fruits and vegetables. Up to 6-8 servings of whole grains each day. Less than 6 oz of lean meat, poultry, or fish each day. A 3-oz serving of meat is about the same size as a deck of cards. One egg equals 1 oz. 2 servings of low-fat dairy each day. A serving of nuts, seeds, or beans 5 times each week. Heart-healthy fats. Healthy fats called Omega-3 fatty acids are found in foods such as flaxseeds and coldwater fish, like sardines, salmon, and mackerel. Limit how much you eat of the following: Canned or prepackaged foods. Food that is high in trans fat, such as fried foods. Food that is high in saturated fat, such as fatty meat. Sweets, desserts, sugary drinks, and other foods with added sugar. Full-fat dairy products. Do not salt foods before eating. Try to eat at least 2 vegetarian meals each week. Eat more home-cooked food and less restaurant, buffet, and fast food. When eating at a restaurant, ask that your food be prepared with less salt or no salt, if possible. What foods are recommended? The items listed may  not be a complete list. Talk with your dietitian about what dietary choices are best for you. Grains Whole-grain or whole-wheat bread. Whole-grain or whole-wheat pasta. Brown rice. [place]. Bulgur. Whole-grain and low-sodium cereals. Pita bread. Low-fat, low-sodium crackers. Whole-wheat flour tortillas. Vegetables Fresh or frozen vegetables (raw, steamed, roasted, or grilled). Low-sodium or reduced-sodium tomato and vegetable juice. Low-sodium or reduced-sodium tomato sauce and tomato paste. Low-sodium or reduced-sodium canned vegetables. Fruits All fresh, dried, or frozen fruit. Canned fruit in natural juice (without added sugar). Meat and other protein foods Skinless chicken or [place]. Ground chicken or [place]. Pork with fat trimmed off. Fish and seafood. Egg whites. Dried beans, peas, or lentils. Unsalted nuts, nut butters, and seeds. Unsalted canned beans. Lean cuts of beef with fat trimmed off. Low-sodium, lean deli meat. Dairy Low-fat (1%) or fat-free (skim) milk. Fat-free, low-fat, or reduced-fat cheeses. Nonfat, low-sodium ricotta or cottage cheese. Low-fat or nonfat yogurt. Low-fat, low-sodium  cheese. Fats and oils Soft margarine without trans fats. Vegetable oil. Low-fat, reduced-fat, or light mayonnaise and salad dressings (reduced-sodium). Canola, safflower, olive, soybean, and sunflower oils. Avocado. Seasoning and other foods Herbs. Spices. Seasoning mixes without salt. Unsalted popcorn and pretzels. Fat-free sweets. What foods are not recommended? The items listed may not be a complete list. Talk with your dietitian about what dietary choices are best for you. Grains Baked goods made with fat, such as croissants, muffins, or some breads. Dry pasta or rice meal packs. Vegetables Creamed or fried vegetables. Vegetables in a cheese sauce. Regular canned vegetables (not low-sodium or reduced-sodium). Regular canned tomato sauce and paste (not low-sodium or reduced-sodium).  Regular tomato and vegetable juice (not low-sodium or reduced-sodium). Angie Fava. Olives. Fruits Canned fruit in a light or heavy syrup. Fried fruit. Fruit in cream or butter sauce. Meat and other protein foods Fatty cuts of meat. Ribs. Fried meat. Berniece Salines. Sausage. Bologna and other processed lunch meats. Salami. Fatback. Hotdogs. Bratwurst. Salted nuts and seeds. Canned beans with added salt. Canned or smoked fish. Whole eggs or egg yolks. Chicken or [place] with skin. Dairy Whole or 2% milk, cream, and half-and-half. Whole or full-fat cream cheese. Whole-fat or sweetened yogurt. Full-fat cheese. Nondairy creamers. Whipped toppings. Processed cheese and cheese spreads. Fats and oils Butter. Stick margarine. Lard. Shortening. Ghee. Bacon fat. Tropical oils, such as coconut, palm kernel, or palm oil. Seasoning and other foods Salted popcorn and pretzels. Onion salt, garlic salt, seasoned salt, table salt, and sea salt. Worcestershire sauce. Tartar sauce. Barbecue sauce. Teriyaki sauce. Soy sauce, including reduced-sodium. Steak sauce. Canned and packaged gravies. Fish sauce. Oyster sauce. Cocktail sauce. Horseradish that you find on the shelf. Ketchup. Mustard. Meat flavorings and tenderizers. Bouillon cubes. Hot sauce and Tabasco sauce. Premade or packaged marinades. Premade or packaged taco seasonings. Relishes. Regular salad dressings. Where to find more information: National Heart, Lung, and [place]: https://wilson-eaton.com/ American Heart Association: www.heart.org Summary The DASH eating plan is a healthy eating plan that has been shown to reduce high blood pressure (hypertension). It may also reduce your risk for type 2 diabetes, heart disease, and stroke. With the DASH eating plan, you should limit salt (sodium) intake to 2,300 mg a day. If you have hypertension, you may need to reduce your sodium intake to 1,500 mg a day. When on the DASH eating plan, aim to eat more fresh fruits and  vegetables, whole grains, lean proteins, low-fat dairy, and heart-healthy fats. Work with your health care provider or diet and nutrition specialist (dietitian) to adjust your eating plan to your individual calorie needs. This information is not intended to replace advice given to you by your health care provider. Make sure you discuss any questions you have with your health care provider. Document Revised: 11/13/2017 Document Reviewed: 11/24/2016 Elsevier Patient Education  2020 [employer].

## 2020-04-23 LAB — COMPLIANCE DRUG ANALYSIS, UR

## 2020-05-02 ENCOUNTER — Ambulatory Visit: Payer: Self-pay | Admitting: Pharmacist

## 2020-05-02 DIAGNOSIS — E1140 Type 2 diabetes mellitus with diabetic neuropathy, unspecified: Secondary | ICD-10-CM

## 2020-05-02 DIAGNOSIS — E785 Hyperlipidemia, unspecified: Secondary | ICD-10-CM

## 2020-05-02 NOTE — Patient Instructions (Signed)
Visit Information  Goals Addressed            This Visit's Progress     Patient Stated   . PharmD "I need to stay healthy" (pt-stated)       CARE PLAN ENTRY (see longtitudinal plan of care for additional care plan information)  Current Barriers:  . Diabetes: uncontrolled; complicated by chronic medical conditions including HTN, CKD, most recent A1c 8.9% o Was given paperwork for [employer] assistance (Basaglar and Humalog) by Dr. Joycie Peek office. 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 Medications managed by her daughter, [name], who fills a weekly pill box . Most recent eGFR: ~25 mL/min per Care Everywhere . Current antihyperglycemic regimen: Levemir 10 units daily - though has only been taking 8 units; Humalog 6-8 units before meals pioglitazone 30 mg daily (cardiology recommended consideration of d/c, endocrinology continued) . Cardiovascular risk reduction: o Current hypertensive regimen: carvedilol 3.125 mg BID, olmesartan 40 mg o Current hyperlipidemia regimen: rosuvastatin 5 mg daily; last LDL 100, consider more stringent goal of <70 given hx CVA o Current antiplatelet regimen: ASA 81 mg daily, clopidogrel 75 mg daily (hx acute CVA in 2018) . 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 Inter-disciplinary care team collaboration (see longitudinal plan of care) . Attempted to discuss patient assistance program requirements, but patient was confused. Will  collaborate w/ patient's daughter for these needs. Contacted Kamella, left message asking her to return my call. Also asked patient to give my number to [facility] the next time they talk. Will discuss me helping coordinate [employer] application and f/u on where they are in the process of obtaining Libre coverage . Moving forward, continue to consider/investigate d/c ASA 81 mg daily, continue clopidogrel monotherapy, as last CVA was ~3 years ago; risk of bleed outweighs benefit of DAPT. Does not appear patient currently follows w/ neurology  . Appears fill history is up to date for all chronic medications  Patient Self Care Activities:  . Patient will check blood glucose BID-TID, document, and provide at future appointments . Patient will take medications as prescribed . Patient will report any questions or concerns to provider   Please see past updates related to this goal by clicking on the "Past Updates" button in the selected goal         Patient verbalizes understanding of instructions provided today.   Plan:  - Will outreach Kamella again in the next 2-3 business days if I have not heard back  [name], PharmD, [place] [phone]

## 2020-05-02 NOTE — Chronic Care Management (AMB) (Signed)
Chronic Care Management   Follow Up Note   05/02/2020 Name: Tonya [name] MRN: [id number] DOB: November 19, 1946  Referred by: [name], NP Reason for referral : Chronic Care Management (Medication Management)   Tonya [name] is a 74 y.o. year old female who is a primary care patient of Cannady, [name], NP. The CCM team was consulted for assistance with chronic disease management and care coordination needs.    Contacted patient for medication management review.   Review of patient status, including review of consultants reports, relevant laboratory and other test results, and collaboration with appropriate care team members and the patient's provider was performed as part of comprehensive patient evaluation and provision of chronic care management services.    SDOH (Social Determinants of Health) assessments performed: Yes See Care Plan activities for detailed interventions related to [facility])     Outpatient Encounter Medications as of 05/02/2020  Medication Sig Note  . traMADol (ULTRAM) 50 MG tablet Take 1 tablet (50 mg total) by mouth every 12 (twelve) hours as needed for severe pain. Month last 30 days.   [name] aspirin 81 MG tablet Take 81 mg by mouth daily.   . Capsaicin 0.05 % GEL Apply 1 Units topically 4 (four) times daily as needed.   . carvedilol (COREG) 3.125 MG tablet Take 1 tablet (3.125 mg total) by mouth 2 (two) times daily with a meal.   . clopidogrel (PLAVIX) 75 MG tablet TAKE 1 TABLET BY MOUTH  DAILY   . DULoxetine (CYMBALTA) 20 MG capsule Take 1 capsule (20 mg total) by mouth daily.   . ferrous sulfate 325 (65 FE) MG EC tablet Take 1 tablet (325 mg total) by mouth 3 (three) times daily with meals. 01/11/2020: Taking twice daily   . furosemide (LASIX) 40 MG tablet Take 40 mg by mouth daily.   [name] HUMALOG KWIKPEN 100 UNIT/ML KiwkPen TK 12 UNITS BEFORE LUNCH AND 8 UNITS BEFORE SUPPER. 03/09/2020: 6 units  . insulin detemir (LEVEMIR) 100 UNIT/ML injection Inject 10 Units  into the skin every morning.    . olmesartan (BENICAR) 40 MG tablet Take 40 mg by mouth daily.   [name] omeprazole (PRILOSEC) 20 MG capsule TAKE 1 CAPSULE BY MOUTH  TWICE DAILY   . pioglitazone (ACTOS) 30 MG tablet Take 30 mg by mouth daily.   . rosuvastatin (CRESTOR) 5 MG tablet Take 1 tablet (5 mg total) by mouth daily.   . Vitamin D, Cholecalciferol, 50 MCG (2000 UT) CAPS Take 2,000 Units by mouth daily.    No facility-administered encounter medications on file as of 05/02/2020.     Objective:   Goals Addressed            This Visit's Progress     Patient Stated   . PharmD "I need to stay healthy" (pt-stated)       CARE PLAN ENTRY (see longtitudinal plan of care for additional care plan information)  Current Barriers:  . Diabetes: uncontrolled; complicated by chronic medical conditions including HTN, CKD, most recent A1c 8.9% o Was given paperwork for [employer] assistance (Basaglar and Humalog) by Dr. Joycie Peek office. 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 Medications managed by her daughter, [name], who fills a weekly pill box . Most recent eGFR: ~25 mL/min per Care Everywhere .  Current antihyperglycemic regimen: Levemir 10 units daily - though has only been taking 8 units; Humalog 6-8 units before meals pioglitazone 30 mg daily (cardiology recommended consideration of d/c, endocrinology continued) . Cardiovascular risk reduction: o Current hypertensive regimen: carvedilol 3.125 mg BID, olmesartan 40 mg o Current hyperlipidemia regimen: rosuvastatin 5 mg daily; last LDL 100, consider more stringent goal of <70 given hx CVA o Current antiplatelet regimen: ASA 81 mg daily, clopidogrel 75 mg daily (hx acute CVA in 2018) . 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 Inter-disciplinary care team collaboration (see longitudinal plan of care) . Attempted to discuss patient assistance program requirements, but patient was confused. Will collaborate w/ patient's daughter for these needs. Contacted Kamella, left message asking her to return my call. Also asked patient to give my number to [facility] the next time they talk. Will discuss me helping coordinate Lilly application and f/u on where they are in the process of obtaining Libre coverage . Moving forward, continue to consider/investigate d/c ASA 81 mg daily, continue clopidogrel monotherapy, as last CVA was ~3 years ago; risk of bleed outweighs benefit of DAPT. Does not appear patient currently follows w/ neurology   Patient Self Care Activities:  . Patient will check blood glucose BID-TID, document, and provide at future appointments . Patient will take medications as prescribed . Patient will report any questions or concerns to provider   Please see past updates related to this goal by clicking on the "Past Updates" button in the selected goal          Plan:  - Will outreach Kamella again in the next 2-3 business days if I have not heard back  [name], PharmD, [place] [phone]

## 2020-05-03 ENCOUNTER — Ambulatory Visit: Payer: Self-pay | Admitting: Pharmacist

## 2020-05-03 NOTE — Chronic Care Management (AMB) (Signed)
  Chronic Care Management   Note  05/03/2020 Name: JANCIE KERCHER MRN: [id number] DOB: 07/08/1946  JASIEL APACHITO is a 74 y.o. year old female who is a primary care patient of Cannady, [name], NP. The CCM team was consulted for assistance with chronic disease management and care coordination needs.    Received return call after hours from patient's daughter. Called back today, LVM asking to discuss patient's medications and medication access concerns.   [name], PharmD, [place] [phone]

## 2020-05-04 ENCOUNTER — Ambulatory Visit: Payer: Self-pay | Admitting: Pharmacist

## 2020-05-04 DIAGNOSIS — E1140 Type 2 diabetes mellitus with diabetic neuropathy, unspecified: Secondary | ICD-10-CM

## 2020-05-04 DIAGNOSIS — E1165 Type 2 diabetes mellitus with hyperglycemia: Secondary | ICD-10-CM

## 2020-05-04 DIAGNOSIS — E785 Hyperlipidemia, unspecified: Secondary | ICD-10-CM

## 2020-05-04 DIAGNOSIS — E1169 Type 2 diabetes mellitus with other specified complication: Secondary | ICD-10-CM

## 2020-05-04 DIAGNOSIS — I1310 Hypertensive heart and chronic kidney disease without heart failure, with stage 1 through stage 4 chronic kidney disease, or unspecified chronic kidney disease: Secondary | ICD-10-CM

## 2020-05-04 DIAGNOSIS — I5032 Chronic diastolic (congestive) heart failure: Secondary | ICD-10-CM | POA: Diagnosis not present

## 2020-05-04 DIAGNOSIS — N184 Chronic kidney disease, stage 4 (severe): Secondary | ICD-10-CM

## 2020-05-04 NOTE — Patient Instructions (Signed)
Visit Information  Goals Addressed            This Visit's Progress     Patient Stated   . PharmD "I need to stay healthy" (pt-stated)       CARE PLAN ENTRY (see longtitudinal plan of care for additional care plan information)  Current Barriers:  . Diabetes: uncontrolled; complicated by chronic medical conditions including HTN, CKD, most recent A1c 8.9% o Kamella is confused about what to do with the [employer] paperwork and [employer] o Notes concerns about her mom having episodes of low blood sugar, and being listless and unsafe at home. Is unsure if her mother has communicated these concerns to the team . Medications managed by her daughter, [name], who fills a weekly pill box . Most recent eGFR: ~25 mL/min per Care Everywhere . Current antihyperglycemic regimen: Levemir 10 units daily - though has only been taking 8 units; Humalog 6-8 units before meals pioglitazone 30 mg daily (cardiology recommended consideration of d/c, endocrinology continued) . Cardiovascular risk reduction: o Current hypertensive regimen: carvedilol 3.125 mg BID, olmesartan 40 mg o Current hyperlipidemia regimen: rosuvastatin 5 mg daily; last LDL 100, consider more stringent goal of <70 given hx CVA o Current antiplatelet regimen: ASA 81 mg daily, clopidogrel 75 mg daily (hx acute CVA in 2018) . 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 Inter-disciplinary care team collaboration (see longitudinal plan of care) . Reviewed with [name] the instructions from Dr. Joycie Peek last visit - [name] is going to call [place] to follow up on the status of the [place] order that was sent by Dr. [name]. [name] is going to complete the [employer] for [employer], get a copy of patient's 2020 tax return, and return to Dr. Joycie Peek office. She will call [employer] to follow up on the status of the application about 2 weeks after dropping back off.  . Reviewed insulin dosing per Dr. [name]: Levemir 10 units QAM, Humalog 6 units with meals if sugar >100. Kamella verbalized understanding . Encouraged Kamella to either attend appointments with her mom, or be on speaker phone during the visits to help share her concerns, understand plans and medication changes, and ask questions. She verbalizes understanding .  Patient Self Care Activities:  . Patient will check blood glucose BID-TID, document, and provide at future appointments . Patient will take medications as prescribed . Patient will report any questions or concerns to provider   Please see past updates related to this goal by clicking on the "Past Updates" button in the selected goal         Patient verbalizes understanding of instructions provided today.   Plan:  - Scheduled f/u call in ~ 6 weeks  [name], PharmD, [place] [phone]

## 2020-05-04 NOTE — Chronic Care Management (AMB) (Signed)
Chronic Care Management   Follow Up Note   05/04/2020 Name: Tonya [name] MRN: [id number] DOB: 24-Jul-1946  Referred by: [name], NP Reason for referral : Chronic Care Management (Medication Management)   Tonya [name] is a 74 y.o. year old female who is a primary care patient of Cannady, [name], NP. The CCM team was consulted for assistance with chronic disease management and care coordination needs.    Received call from patient's daughter, [name].   Review of patient status, including review of consultants reports, relevant laboratory and other test results, and collaboration with appropriate care team members and the patient's provider was performed as part of comprehensive patient evaluation and provision of chronic care management services.    SDOH (Social Determinants of Health) assessments performed: Yes See Care Plan activities for detailed interventions related to [facility])     Outpatient Encounter Medications as of 05/04/2020  Medication Sig Note  . HUMALOG KWIKPEN 100 UNIT/ML KiwkPen TK 12 UNITS BEFORE LUNCH AND 8 UNITS BEFORE SUPPER. 03/09/2020: 6 units  . insulin detemir (LEVEMIR) 100 UNIT/ML injection Inject 10 Units into the skin every morning.    [name] aspirin 81 MG tablet Take 81 mg by mouth daily.   . Capsaicin 0.05 % GEL Apply 1 Units topically 4 (four) times daily as needed.   . carvedilol (COREG) 3.125 MG tablet Take 1 tablet (3.125 mg total) by mouth 2 (two) times daily with a meal.   . clopidogrel (PLAVIX) 75 MG tablet TAKE 1 TABLET BY MOUTH  DAILY   . DULoxetine (CYMBALTA) 20 MG capsule Take 1 capsule (20 mg total) by mouth daily.   . ferrous sulfate 325 (65 FE) MG EC tablet Take 1 tablet (325 mg total) by mouth 3 (three) times daily with meals. 01/11/2020: Taking twice daily   . furosemide (LASIX) 40 MG tablet Take 40 mg by mouth daily.   [name] olmesartan (BENICAR) 40 MG tablet Take 40 mg by mouth daily.   [name] omeprazole (PRILOSEC) 20 MG capsule TAKE 1  CAPSULE BY MOUTH  TWICE DAILY   . pioglitazone (ACTOS) 30 MG tablet Take 30 mg by mouth daily.   . rosuvastatin (CRESTOR) 5 MG tablet Take 1 tablet (5 mg total) by mouth daily.   . traMADol (ULTRAM) 50 MG tablet Take 1 tablet (50 mg total) by mouth every 12 (twelve) hours as needed for severe pain. Month last 30 days.   . Vitamin D, Cholecalciferol, 50 MCG (2000 UT) CAPS Take 2,000 Units by mouth daily.    No facility-administered encounter medications on file as of 05/04/2020.     Objective:   Goals Addressed            This Visit's Progress     Patient Stated   . PharmD "I need to stay healthy" (pt-stated)       CARE PLAN ENTRY (see longtitudinal plan of care for additional care plan information)  Current Barriers:  . Diabetes: uncontrolled; complicated by chronic medical conditions including HTN, CKD, most recent A1c 8.9% o Kamella is confused about what to do with the [employer] paperwork and [employer] o Notes concerns about her mom having episodes of low blood sugar, and being listless and unsafe at home. Is unsure if her mother has communicated these concerns to the team . Medications managed by her daughter, [name], who fills a weekly pill box . Most recent eGFR: ~25 mL/min per Care Everywhere . Current antihyperglycemic regimen: Levemir 10 units daily - though  has only been taking 8 units; Humalog 6-8 units before meals pioglitazone 30 mg daily (cardiology recommended consideration of d/c, endocrinology continued) . Cardiovascular risk reduction: o Current hypertensive regimen: carvedilol 3.125 mg BID, olmesartan 40 mg o Current hyperlipidemia regimen: rosuvastatin 5 mg daily; last LDL 100, consider more stringent goal of <70 given hx CVA o Current antiplatelet regimen: ASA 81 mg daily, clopidogrel 75 mg daily (hx acute CVA in 2018) . 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 Inter-disciplinary care team collaboration (see longitudinal plan of care) . Reviewed with [name] the instructions from Dr. Joycie Peek last visit - [name] is going to call [place] to follow up on the status of the [place] order that was sent by Dr. [name]. [name] is going to complete the [employer] for [employer], get a copy of patient's 2020 tax return, and return to Dr. Joycie Peek office. She will call [employer] to follow up on the status of the application about 2 weeks after dropping back off.  . Reviewed insulin dosing per Dr. [name]: Levemir 10 units QAM, Humalog 6 units with meals if sugar >100. Kamella verbalized understanding . Encouraged Kamella to either attend appointments with her mom, or be on speaker phone during the visits to help share her concerns, understand plans and medication changes, and ask questions. She verbalizes understanding .  Patient Self Care Activities:  . Patient will check blood glucose BID-TID, document, and provide at future appointments . Patient will take medications as prescribed . Patient will report any questions or concerns to provider   Please see past updates related to this goal by clicking on the "Past Updates" button in the selected goal          Plan:  - Scheduled f/u call in ~ 6 weeks  [name], PharmD, [place] [phone]

## 2020-05-14 ENCOUNTER — Other Ambulatory Visit: Payer: Self-pay | Admitting: Cardiovascular Disease

## 2020-05-22 ENCOUNTER — Telehealth: Payer: Self-pay | Admitting: Nurse Practitioner

## 2020-05-22 NOTE — Telephone Encounter (Signed)
Would recommend continue to monitor BP at home, with goal <130/80, and document BP.  If ongoing elevations recommend visit with PCP or heart doctor.  If symptomatic with elevations such as SOB, CP, dizziness, severe headache, or any facial changes then immediately go to ER.  She is occasionally elevated here in office, but on repeat often much improved and has had low BP in past that caused dizziness.

## 2020-05-22 NOTE — Telephone Encounter (Signed)
Copied from [place] [id number]. Topic: General - Inquiry >> May 22, 2020 12:10 PM Greggory Keen D wrote: Reason for CRM: [facility] nurse Amber called saying pt's blood pressure is a little elevate.  183/95, 152/90..172/82 .  Please call the patient and advise CB# [phone]

## 2020-05-22 NOTE — Telephone Encounter (Signed)
[name],  Does anything special need to be advised?

## 2020-05-22 NOTE — Telephone Encounter (Signed)
Spoke with pt, gave [name]'s Advice.

## 2020-05-31 ENCOUNTER — Ambulatory Visit: Payer: Self-pay | Admitting: *Deleted

## 2020-05-31 NOTE — Telephone Encounter (Signed)
Please alert patient if elevations in BP and if having symptoms with this such as SOB, CP, palpitations, dizziness, headaches then recommend she be seen in ER immediately.  She is scheduled to see me on Monday, but I would also recommend she reach out to cardiology ASAP for follow-up.

## 2020-05-31 NOTE — Telephone Encounter (Signed)
[place] nurse- Calling to report elevated BP readings on daily patient checks  Reason for Disposition  Systolic BP  >= 700 OR Diastolic >= 525  Answer Assessment - Initial Assessment Questions 1. BLOOD PRESSURE: "What is the blood pressure?" "Did you take at least two measurements 5 minutes apart?"     Nurse is calling to report that patient is new to monitoring program and has had elevations in BP- 6/14 167/98, 6/15 157/87, 6/16 164/85, today: 185/97 before medication and 2 hours after-176/89. Call to patient- BP: 178/95 2. ONSET: "When did you take your blood pressure?"     Morning/ afternoon 3. HOW: "How did you obtain the blood pressure?" (e.g., visiting nurse, automatic home BP monitor)     Automatic cuff- arm 4. HISTORY: "Do you have a history of high blood pressure?"     yes 5. MEDICATIONS: "Are you taking any medications for blood pressure?" "Have you missed any doses recently?"     No missed doses reported 6. OTHER SYMPTOMS: "Do you have any symptoms?" (e.g., headache, chest pain, blurred vision, difficulty breathing, weakness)     Swelling in feet- bilateral- that comes and goes 7. PREGNANCY: "Is there any chance you are pregnant?" "When was your last menstrual period?"     n/a  Protocols used: HIGH BLOOD PRESSURE-A-AH

## 2020-06-01 DIAGNOSIS — E119 Type 2 diabetes mellitus without complications: Secondary | ICD-10-CM | POA: Diagnosis not present

## 2020-06-01 LAB — HM DIABETES EYE EXAM

## 2020-06-01 NOTE — Telephone Encounter (Signed)
Patient notified and verbalized understanding. 

## 2020-06-04 ENCOUNTER — Other Ambulatory Visit: Payer: Self-pay

## 2020-06-04 ENCOUNTER — Encounter: Payer: Self-pay | Admitting: Nurse Practitioner

## 2020-06-04 ENCOUNTER — Ambulatory Visit (INDEPENDENT_AMBULATORY_CARE_PROVIDER_SITE_OTHER): Payer: Medicare Other | Admitting: Nurse Practitioner

## 2020-06-04 VITALS — BP 128/78 | HR 83 | Temp 98.4°F | Wt 178.2 lb

## 2020-06-04 DIAGNOSIS — N184 Chronic kidney disease, stage 4 (severe): Secondary | ICD-10-CM

## 2020-06-04 DIAGNOSIS — I1310 Hypertensive heart and chronic kidney disease without heart failure, with stage 1 through stage 4 chronic kidney disease, or unspecified chronic kidney disease: Secondary | ICD-10-CM

## 2020-06-04 DIAGNOSIS — I5032 Chronic diastolic (congestive) heart failure: Secondary | ICD-10-CM | POA: Diagnosis not present

## 2020-06-04 NOTE — Assessment & Plan Note (Signed)
Chronic, ongoing with BP initially elevated today, but repeat below goal.  Reports occasional elevations at home, but did not bring cuff with her today.  Reports she is taking medication as ordered, daughter is prepping.  Continue current medication regimen and adjust as needed.   Recommend she monitor BP daily at home and document + bring BP cuff to next visit.  Continue to collaborate with nephrology and cardiology + CCM team in house.  BMP today.  Return in July for chronic disease visit.

## 2020-06-04 NOTE — Assessment & Plan Note (Signed)
Chronic, ongoing with recent EF 60-65%.  Continue current heart medication regimen and collaboration with cardiology + CCM team in office.  She needs lots of education on HF, needs to have diet reiterated each visit. - Reminded to call for an overnight weight gain of >2 pounds or a weekly weight weight of >5 pounds -- work with CCM team on obtaining scale. - not adding salt to his food and has been reading food labels. Reviewed the importance of keeping daily sodium intake to 2000mg  daily  - Wear compression hose daily -- not on today

## 2020-06-04 NOTE — Patient Instructions (Signed)
Heart Failure Eating Plan Heart failure, also called congestive heart failure, occurs when your heart does not pump blood well enough to meet your body's needs for oxygen-rich blood. Heart failure is a long-term (chronic) condition. Living with heart failure can be challenging. However, following your health care provider's instructions about a healthy lifestyle and working with a diet and nutrition specialist (dietitian) to choose the right foods may help to improve your symptoms. What are tips for following this plan? Reading food labels  Check food labels for the amount of sodium per serving. Choose foods that have less than 140 mg (milligrams) of sodium in each serving.  Check food labels for the number of calories per serving. This is important if you need to limit your daily calorie intake to lose weight.  Check food labels for the serving size. If you eat more than one serving, you will be eating more sodium and calories than what is listed on the label.  Look for foods that are labeled as "sodium-free," "very low sodium," or "low sodium." ? Foods labeled as "reduced sodium" or "lightly salted" may still have more sodium than what is recommended for you. Cooking  Avoid adding salt when cooking. Ask your health care provider or dietitian before using salt substitutes.  Season food with salt-free seasonings, spices, or herbs. Check the label of seasoning mixes to make sure they do not contain salt.  Cook with heart-healthy oils, such as olive, canola, soybean, or sunflower oil.  Do not fry foods. Cook foods using low-fat methods, such as baking, boiling, grilling, and broiling.  Limit unhealthy fats when cooking by: ? Removing the skin from poultry, such as chicken. ? Removing all visible fats from meats. ? Skimming the fat off from stews, soups, and gravies before serving them. Meal planning   Limit your intake of: ? Processed, canned, or pre-packaged foods. ? Foods that are  high in trans fat, such as fried foods. ? Sweets, desserts, sugary drinks, and other foods with added sugar. ? Full-fat dairy products, such as whole milk.  Eat a balanced diet that includes: ? 4-5 servings of fruit each day and 4-5 servings of vegetables each day. At each meal, try to fill half of your plate with fruits and vegetables. ? Up to 6-8 servings of whole grains each day. ? Up to 2 servings of lean meat, poultry, or fish each day. One serving of meat is equal to 3 oz. This is about the same size as a deck of cards. ? 2 servings of low-fat dairy each day. ? Heart-healthy fats. Healthy fats called omega-3 fatty acids are found in foods such as flaxseed and cold-water fish like sardines, salmon, and mackerel.  Aim to eat 25-35 g (grams) of fiber a day. Foods that are high in fiber include apples, broccoli, carrots, beans, peas, and whole grains.  Do not add salt or condiments that contain salt (such as soy sauce) to foods before eating.  When eating at a restaurant, ask that your food be prepared with less salt or no salt, if possible.  Try to eat 2 or more vegetarian meals each week.  Eat more home-cooked food and eat less restaurant, buffet, and fast food. General information  Do not eat more than 2,300 mg of salt (sodium) a day. The amount of sodium that is recommended for you may be lower, depending on your condition.  Maintain a healthy body weight as directed. Ask your health care provider what a healthy weight is  for you. ? Check your weight every day. ? Work with your health care provider and dietitian to make a plan that is right for you to lose weight or maintain your current weight.  Limit how much fluid you drink. Ask your health care provider or dietitian how much fluid you can have each day.  Limit or avoid alcohol as told by your health care provider or dietitian. Recommended foods The items listed may not be a complete list. Talk with your dietitian about what  dietary choices are best for you. Fruits All fresh, frozen, and canned fruits. Dried fruits, such as raisins, prunes, and cranberries. Vegetables All fresh vegetables. Vegetables that are frozen without sauce or added salt. Low-sodium or sodium-free canned vegetables. Grains Bread with less than 80 mg of sodium per slice. Whole-wheat pasta, quinoa, and brown rice. Oats and oatmeal. Barley. [place]. Grits and cream of wheat. Whole-grain and whole-wheat cold cereal. Meats and other protein foods Lean cuts of meat. Skinless chicken and [place]. Fish with high omega-3 fatty acids, such as salmon, sardines, and other cold-water fishes. Eggs. Dried beans, peas, and edamame. Unsalted nuts and nut butters. Dairy Low-fat or nonfat (skim) milk and dried milk. Rice milk, soy milk, and almond milk. Low-fat or nonfat yogurt. Small amounts of reduced-sodium block cheese. Low-sodium cottage cheese. Fats and oils Olive, canola, soybean, flaxseed, or sunflower oil. Avocado. Sweets and desserts Apple sauce. Granola bars. Sugar-free pudding and gelatin. Frozen fruit bars. Seasoning and other foods Fresh and dried herbs. Lemon or lime juice. Vinegar. Low-sodium ketchup. Salt-free marinades, salad dressings, sauces, and seasonings. The items listed above may not be a complete list of foods and beverages you can eat. Contact a dietitian for more information. Foods to avoid The items listed may not be a complete list. Talk with your dietitian about what dietary choices are best for you. Fruits Fruits that are dried with sodium-containing preservatives. Vegetables Canned vegetables. Frozen vegetables with sauce or seasonings. Creamed vegetables. [place] fries. Onion rings. Pickled vegetables and sauerkraut. Grains Bread with more than 80 mg of sodium per slice. Hot or cold cereal with more than 140 mg sodium per serving. Salted pretzels and crackers. Pre-packaged breadcrumbs. Bagels, croissants, and biscuits. Meats  and other protein foods Ribs and chicken wings. Bacon, ham, pepperoni, bologna, salami, and packaged luncheon meats. Hot dogs, bratwurst, and sausage. Canned meat. Smoked meat and fish. Salted nuts and seeds. Dairy Whole milk, half-and-half, and cream. Buttermilk. Processed cheese, cheese spreads, and cheese curds. Regular cottage cheese. Feta cheese. Shredded cheese. String cheese. Fats and oils Butter, lard, shortening, ghee, and bacon fat. Canned and packaged gravies. Seasoning and other foods Onion salt, garlic salt, table salt, and sea salt. Marinades. Regular salad dressings. Relishes, pickles, and olives. Meat flavorings and tenderizers, and bouillon cubes. Horseradish, ketchup, and mustard. Worcestershire sauce. Teriyaki sauce, soy sauce (including reduced sodium). Hot sauce and Tabasco sauce. Steak sauce, fish sauce, oyster sauce, and cocktail sauce. Taco seasonings. Barbecue sauce. Tartar sauce. The items listed above may not be a complete list of foods and beverages you should avoid. Contact a dietitian for more information. Summary  A heart failure eating plan includes changes that limit your intake of sodium and unhealthy fat, and it may help you lose weight or maintain a healthy weight. Your health care provider may also recommend limiting how much fluid you drink.  Most people with heart failure should eat no more than 2,300 mg of salt (sodium) a day. The amount of sodium  that is recommended for you may be lower, depending on your condition.  Contact your health care provider or dietitian before making any major changes to your diet. This information is not intended to replace advice given to you by your health care provider. Make sure you discuss any questions you have with your health care provider. Document Revised: 01/27/2019 Document Reviewed: 04/17/2017 Elsevier Patient Education  2020 Elsevier Inc.  

## 2020-06-04 NOTE — Progress Notes (Signed)
BP 128/78 (BP Location: Left Arm)    Pulse 83    Temp 98.4 F (36.9 C) (Oral)    Wt 178 lb 3.2 oz (80.8 kg)    LMP  (LMP Unknown)    SpO2 95%    BMI 27.91 kg/m    Subjective:    Patient ID: Tonya [name], female    DOB: 12/30/1945, 74 y.o.   MRN: [id number]  HPI: Tonya [name] is a 74 y.o. female  Chief Complaint  Patient presents with   Hypertension    pt states she has been getting elevated BP readings for the past month   HYPERTENSION WITH HF She reports getting higher readings at home over past month.  She wonders if it is her BP cuff, as is fluctuating SBP 120-170 range.  No SOB, CP, or HA reported.  Followed by cardiology, Dr. [name] Situ.  Continues on Carvedilol, Benicar, Lasix, and Crestor.  Her daughter helps her with pills and organizing.  Last seen by cardiology on 01/05/20.  Her LVEF on 12/02/2019 was 60 to 65% with moderate LVH, and grade 1 diastolic dysfunction.  Discussed use of compression hose with her and she reports occasional use, but not consistent. Does not weigh herself daily at home.  She is also followed by nephrology and last saw them on 03/07/20 -- they restarted Benicar.  Last labs with them on 03/28/20 == CRT 2.2, BUN 39, and GFR 27.  She has a son who obtains dialysis and a brother -- end stage kidney disease is present in family.    She denies high sodium diet at home, eats mainly chicken and fish -- no beef.  On side will have baked potato, greens beans, creamed potatoes, pinto beans, squash.   Hypertension status: stable  Satisfied with current treatment? yes Duration of hypertension: chronic BP monitoring frequency:  daily BP range: 120-170/70-80 BP medication side effects:  no Medication compliance: good compliance Aspirin: yes Recurrent headaches: no Visual changes: no Palpitations: no Dyspnea: no Chest pain: no Lower extremity edema: yes at baseline  Dizzy/lightheaded: no  Relevant past medical, surgical, family and social history reviewed and  updated as indicated. Interim medical history since our last visit reviewed. Allergies and medications reviewed and updated.  Review of Systems  Constitutional: Negative for activity change, appetite change, diaphoresis, fatigue and fever.  Respiratory: Negative for cough, chest tightness and shortness of breath.   Cardiovascular: Negative for chest pain, palpitations and leg swelling.  Gastrointestinal: Negative.   Neurological: Negative.   Psychiatric/Behavioral: Negative.     Per HPI unless specifically indicated above     Objective:    BP 128/78 (BP Location: Left Arm)    Pulse 83    Temp 98.4 F (36.9 C) (Oral)    Wt 178 lb 3.2 oz (80.8 kg)    LMP  (LMP Unknown)    SpO2 95%    BMI 27.91 kg/m   Wt Readings from Last 3 Encounters:  06/04/20 178 lb 3.2 oz (80.8 kg)  04/18/20 177 lb (80.3 kg)  04/10/20 175 lb (79.4 kg)    Physical Exam Vitals and nursing note reviewed.  Constitutional:      General: She is awake. She is not in acute distress.    Appearance: She is well-developed. She is not ill-appearing.  HENT:     Head: Normocephalic.     Right Ear: Hearing normal.     Left Ear: Hearing normal.  Eyes:     General: Lids are  normal.        Right eye: No discharge.        Left eye: No discharge.     Conjunctiva/sclera: Conjunctivae normal.     Pupils: Pupils are equal, round, and reactive to light.  Neck:     Thyroid: No thyromegaly.     Vascular: No carotid bruit.  Cardiovascular:     Rate and Rhythm: Normal rate and regular rhythm.     Heart sounds: Normal heart sounds. No murmur heard.  No gallop.   Pulmonary:     Effort: Pulmonary effort is normal. No accessory muscle usage or respiratory distress.     Breath sounds: Normal breath sounds.  Abdominal:     General: Bowel sounds are normal.     Palpations: Abdomen is soft.  Musculoskeletal:     Cervical back: Normal range of motion and neck supple.     Right lower leg: 1+ Edema present.     Left lower leg: 1+  Edema present.  Skin:    General: Skin is warm and dry.     Findings: No rash.  Neurological:     Mental Status: She is alert and oriented to person, place, and time.  Psychiatric:        Attention and Perception: Attention normal.        Mood and Affect: Mood normal.        Behavior: Behavior normal. Behavior is cooperative.        Thought Content: Thought content normal.        Judgment: Judgment normal.     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  Reports occasional elevations at home, but did not bring cuff with her today.  Reports she is taking medication as ordered, daughter is prepping.  Continue current medication regimen and adjust as needed.   Recommend she monitor BP daily at home and document + bring BP cuff to next visit.  Continue to collaborate with nephrology and cardiology + CCM team in house.  BMP today.  Return in July for chronic disease visit.      Relevant Orders   Basic metabolic panel   Chronic heart failure with preserved ejection fraction (HFpEF) (HCC)    Chronic, ongoing with recent EF 60-65%.  Continue current heart medication regimen and collaboration with cardiology + CCM team in office.  She needs lots of education on HF, needs to have diet reiterated each visit. - Reminded to call for an overnight weight gain of >2 pounds or a weekly weight weight of >5 pounds -- work with CCM team on obtaining scale. - not adding salt to his food and has been reading food labels. Reviewed the importance of keeping daily sodium intake to 2000mg  daily  - Wear compression hose daily -- not on today         Genitourinary   Chronic kidney disease, stage 4, severely decreased GFR (HCC)    Chronic, ongoing.  Continue current medication regimen and collaboration with nephrology.  BMP today.          Follow  up plan: Return for as scheduled in July or August for chronic disease visit.[name]

## 2020-06-04 NOTE — Assessment & Plan Note (Signed)
Chronic, ongoing.  Continue current medication regimen and collaboration with nephrology.  BMP today.

## 2020-06-05 ENCOUNTER — Ambulatory Visit (INDEPENDENT_AMBULATORY_CARE_PROVIDER_SITE_OTHER): Payer: Medicare Other | Admitting: Pharmacist

## 2020-06-05 DIAGNOSIS — E1140 Type 2 diabetes mellitus with diabetic neuropathy, unspecified: Secondary | ICD-10-CM

## 2020-06-05 DIAGNOSIS — E1165 Type 2 diabetes mellitus with hyperglycemia: Secondary | ICD-10-CM

## 2020-06-05 LAB — BASIC METABOLIC PANEL
BUN/Creatinine Ratio: 21 (ref 12–28)
BUN: 50 mg/dL — ABNORMAL HIGH (ref 8–27)
CO2: 19 mmol/L — ABNORMAL LOW (ref 20–29)
Calcium: 8.8 mg/dL (ref 8.7–10.3)
Chloride: 113 mmol/L — ABNORMAL HIGH (ref 96–106)
Creatinine, Ser: 2.36 mg/dL — ABNORMAL HIGH (ref 0.57–1.00)
GFR calc Af Amer: 23 mL/min/{1.73_m2} — ABNORMAL LOW (ref >59)
GFR calc non Af Amer: 20 mL/min/{1.73_m2} — ABNORMAL LOW (ref >59)
Glucose: 170 mg/dL — ABNORMAL HIGH (ref 65–99)
Potassium: 4.6 mmol/L (ref 3.5–5.2)
Sodium: 146 mmol/L — ABNORMAL HIGH (ref 134–144)

## 2020-06-05 NOTE — Chronic Care Management (AMB) (Signed)
Chronic Care Management   Follow Up Note   06/05/2020 Name: Tonya [name] MRN: [id number] DOB: 1946-08-17  Referred by: [name], NP Reason for referral : Chronic Care Management (Medication Management)   Tonya [name] is a 74 y.o. year old female who is a primary care patient of Cannady, [name], NP. The CCM team was consulted for assistance with chronic disease management and care coordination needs.    Contacted patient for medication management review.   Review of patient status, including review of consultants reports, relevant laboratory and other test results, and collaboration with appropriate care team members and the patient's provider was performed as part of comprehensive patient evaluation and provision of chronic care management services.    SDOH (Social Determinants of Health) assessments performed: No See Care Plan activities for detailed interventions related to [facility])     Outpatient Encounter Medications as of 06/05/2020  Medication Sig Note  . aspirin 81 MG tablet Take 81 mg by mouth daily.   . carvedilol (COREG) 3.125 MG tablet TAKE 1 TABLET BY MOUTH  TWICE DAILY WITH A MEAL   . clopidogrel (PLAVIX) 75 MG tablet TAKE 1 TABLET BY MOUTH  DAILY   . DULoxetine (CYMBALTA) 20 MG capsule Take 1 capsule (20 mg total) by mouth daily.   . ferrous sulfate 325 (65 FE) MG EC tablet Take 1 tablet (325 mg total) by mouth 3 (three) times daily with meals. 01/11/2020: Taking twice daily   . HUMALOG KWIKPEN 100 UNIT/ML KiwkPen TK 12 UNITS BEFORE LUNCH AND 8 UNITS BEFORE SUPPER. 03/09/2020: 6 units  . insulin detemir (LEVEMIR) 100 UNIT/ML injection Inject 6 Units into the skin every morning.    . olmesartan (BENICAR) 40 MG tablet Take 40 mg by mouth daily.   [name] omeprazole (PRILOSEC) 20 MG capsule TAKE 1 CAPSULE BY MOUTH  TWICE DAILY   . pioglitazone (ACTOS) 30 MG tablet Take 30 mg by mouth daily.   . rosuvastatin (CRESTOR) 5 MG tablet TAKE 1 TABLET BY MOUTH  DAILY   .  Vitamin D, Cholecalciferol, 50 MCG (2000 UT) CAPS Take 2,000 Units by mouth daily.   . Capsaicin 0.05 % GEL Apply 1 Units topically 4 (four) times daily as needed.   . furosemide (LASIX) 40 MG tablet Take 40 mg by mouth daily.   . traMADol (ULTRAM) 50 MG tablet Take 1 tablet (50 mg total) by mouth every 12 (twelve) hours as needed for severe pain. Month last 30 days.    No facility-administered encounter medications on file as of 06/05/2020.     Objective:   Goals Addressed              This Visit's Progress     Patient Stated   .  PharmD "I need to stay healthy" (pt-stated)        CARE PLAN ENTRY (see longtitudinal plan of care for additional care plan information)  Current Barriers:  . Diabetes: uncontrolled; complicated by chronic medical conditions including HTN, CKD, most recent A1c 8.5% o Patient notes that she is going to take the [employer] paperwork back to Dr. Joycie Peek office when she goes to see her next. She is unsure status of FreeStyle Libre CGM order . Medications managed by her daughter, Tonya [name], who fills a weekly pill box . Most recent eGFR: ~25 mL/min per Care Everywhere . Current antihyperglycemic regimen: Levemir 10 units daily - though has only been taking 8 units QAM; Humalog 6-8 units before meals (though often  skipping if BG "low" and taking after her meal to correct), pioglitazone 30 mg daily (cardiology recommended consideration of d/c, endocrinology continued) . Current glucose readings:  o Fasting 110-120s o Before supper: 200-230s . Current meal patterns: o Eats breakfast ~10 am o Snack in mid afternoon: banana, canned peaches, pack of nabs o Supper ~ 6 pm . Cardiovascular risk reduction: o Current hypertensive regimen: carvedilol 3.125 mg BID, olmesartan 40 mg o Current hyperlipidemia regimen: rosuvastatin 5 mg daily; last LDL 100, consider more stringent goal of <70 given hx CVA o Current antiplatelet regimen: ASA 81 mg daily, clopidogrel 75 mg  daily (hx acute CVA in 2018) . 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 Comprehensive medication review performed, medication list updated in electronic medical record . Inter-disciplinary care team collaboration (see longitudinal plan of care) . Reviewed insulin dosing principals. Encouraged to increase Levemir to 10 units every morning, and take Humalog 6 units before each meal (usually 2 meals) if pre-meal sugar >100. Reiterated these instructions multiple times and employed teach back method. Will mail instructions to patient as well  . Extensive dietary discussion. Discussed appropriate serving sizes of her favorite snacks, like nabs and fruit. Encouraged to rinse syrup off of pre-packed fruits. Reviewed that since her pre-supper readings are the highest point of her day, cutting back on afternoon snacking should help this. She verbalized agreement.  . Encouraged her to discuss FreeStyle Libre CGM with daughter, Tonya [name], and call Dr. Joycie Peek office for f/u support w/ DME company.   Patient Self Care Activities:  . Patient will check blood glucose BID-TID, document, and provide at future appointments . Patient will take medications as prescribed . Patient will report any questions or concerns to provider   Please see past updates related to this goal by clicking on the "Past Updates" button in the selected goal          Plan:  - Scheduled f/u call in ~ 12 weeks  [name], PharmD, [place] [phone]

## 2020-06-05 NOTE — Progress Notes (Signed)
Contacted via [facility] morning Tonya [name], your labs have returned.  Kidney function remains at baseline.  Sodium (salt) on labs is a little high, I do recommend drinking more water at home and we will recheck this at next visit.  If any questions let me know. Continue to follow closely with your kidney doctor.  Have a great day!!

## 2020-06-05 NOTE — Patient Instructions (Addendum)
Tonya [name],   It was great talking with you today!  1. Call [employer] to follow up on getting the FreeStyle Libre continuous glucose monitor that Dr. [name] ordered. Their phone number is [phone]  2. Take the [employer] assistance paperwork to get your insulin free from the drug company back to Dr. Joycie Peek office to be faxed in.   3. Look at the enclosed handout about appropriate serving sizes of carbohydrates. For example, you should only eat 1/2 of a banana, or only eat 3 nabs crackers. Cutting back on your afternoon snacking will really help decrease your pre-supper sugar readings.   3. Take Levemir 10 units EVERY MORNING. If you realize in the afternoon that you forgot your morning dose, skip it, and wait until the next morning.  4. Take Humalog 6 units BEFORE BREAKFAST and BEFORE SUPPER if you sugar is >100. Your body needs this insulin to help process the sugars in that meal. DO NOT wait until after a meal to check your sugar, and then give insulin. This can cause a dangerous low blood sugar.   This is how you should check your sugars and take your insulin:   - Wake up -> Take Levemir 10 units - Check blood sugar before breakfast -> Take breakfast Humalog 6 units -> eat breakfast (later in the day)  - Check blood sugar before supper -> Take supper Humalog 6 units -> eat supper   Please call us with any questions or concerns!  Tonya [name], PharmD [phone]  Visit Information  Goals Addressed              This Visit's Progress     Patient Stated   .  PharmD "I need to stay healthy" (pt-stated)        CARE PLAN ENTRY (see longtitudinal plan of care for additional care plan information)  Current Barriers:  . Diabetes: uncontrolled; complicated by chronic medical conditions including HTN, CKD, most recent A1c 8.5% o Patient notes that she is going to take the [employer] paperwork back to Dr. Joycie Peek office when she goes to see her next. She is unsure  status of FreeStyle Libre CGM order . Medications managed by her daughter, Tonya [name], who fills a weekly pill box . Most recent eGFR: ~25 mL/min per Care Everywhere . Current antihyperglycemic regimen: Levemir 10 units daily - though has only been taking 8 units QAM; Humalog 6-8 units before meals (though often skipping if BG "low" and taking after her meal to correct), pioglitazone 30 mg daily (cardiology recommended consideration of d/c, endocrinology continued) . Current glucose readings:  o Fasting 110-120s o Before supper: 200-230s . Current meal patterns: o Eats breakfast ~10 am o Snack in mid afternoon: banana, canned peaches, pack of nabs o Supper ~ 6 pm . Cardiovascular risk reduction: o Current hypertensive regimen: carvedilol 3.125 mg BID, olmesartan 40 mg o Current hyperlipidemia regimen: rosuvastatin 5 mg daily; last LDL 100, consider more stringent goal of <70 given hx CVA o Current antiplatelet regimen: ASA 81 mg daily, clopidogrel 75 mg daily (hx acute CVA in 2018) . 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 Comprehensive medication review performed, medication list updated in electronic medical record . Inter-disciplinary care team collaboration (  see longitudinal plan of care) . Reviewed insulin dosing principals. Encouraged to increase Levemir to 10 units every morning, and take Humalog 6 units before each meal (usually 2 meals) if pre-meal sugar >100. Reiterated these instructions multiple times and employed teach back method. Will mail instructions to patient as well  . Extensive dietary discussion. Discussed appropriate serving sizes of her favorite snacks, like nabs and fruit. Encouraged to rinse  syrup off of pre-packed fruits. Reviewed that since her pre-supper readings are the highest point of her day, cutting back on afternoon snacking should help this. She verbalized agreement.  . Encouraged her to discuss FreeStyle Libre CGM with daughter, Tonya [name], and call Dr. Joycie Peek office for f/u support w/ DME company.   Patient Self Care Activities:  . Patient will check blood glucose BID-TID, document, and provide at future appointments . Patient will take medications as prescribed . Patient will report any questions or concerns to provider   Please see past updates related to this goal by clicking on the "Past Updates" button in the selected goal         The patient verbalized understanding of instructions provided today and agreed to receive a mailed copy of patient instruction and/or educational materials.  Plan:  - Scheduled f/u call in ~ 12 weeks  Tonya [name], PharmD, [place] [phone]

## 2020-06-07 ENCOUNTER — Ambulatory Visit
Payer: Medicare Other | Attending: Student in an Organized Health Care Education/Training Program | Admitting: Student in an Organized Health Care Education/Training Program

## 2020-06-07 ENCOUNTER — Other Ambulatory Visit: Payer: Self-pay

## 2020-06-07 ENCOUNTER — Encounter: Payer: Self-pay | Admitting: Student in an Organized Health Care Education/Training Program

## 2020-06-07 VITALS — BP 129/75 | HR 78 | Temp 97.0°F | Ht 67.0 in | Wt 177.0 lb

## 2020-06-07 DIAGNOSIS — M5136 Other intervertebral disc degeneration, lumbar region: Secondary | ICD-10-CM | POA: Insufficient documentation

## 2020-06-07 DIAGNOSIS — M47816 Spondylosis without myelopathy or radiculopathy, lumbar region: Secondary | ICD-10-CM | POA: Insufficient documentation

## 2020-06-07 DIAGNOSIS — B0229 Other postherpetic nervous system involvement: Secondary | ICD-10-CM

## 2020-06-07 DIAGNOSIS — G894 Chronic pain syndrome: Secondary | ICD-10-CM | POA: Insufficient documentation

## 2020-06-07 DIAGNOSIS — M792 Neuralgia and neuritis, unspecified: Secondary | ICD-10-CM | POA: Diagnosis not present

## 2020-06-07 DIAGNOSIS — I639 Cerebral infarction, unspecified: Secondary | ICD-10-CM | POA: Diagnosis not present

## 2020-06-07 MED ORDER — HYDROCODONE-ACETAMINOPHEN 5-325 MG PO TABS: 1.0000 | ORAL_TABLET | Freq: Two times a day (BID) | ORAL | 0 refills | Status: AC | PRN

## 2020-06-07 NOTE — Patient Instructions (Addendum)
1.  Plan for right L3, L4, L5 facet medial branch nerve block.  This is a diagnostic nerve block for pain related to lower back arthritis.  If it is helpful, can discuss next steps with you.  Please stop your Plavix 7 days prior to your scheduled procedure.  2.  Stop tramadol since it is not helpful and causing vivid dreams.  Can consider hydrocodone 1 tablet nightly as needed for increased pain.

## 2020-06-07 NOTE — Progress Notes (Signed)
Nursing Pain Medication Assessment:  Safety precautions to be maintained throughout the outpatient stay will include: orient to surroundings, keep bed in low position, maintain call bell within reach at all times, provide assistance with transfer out of bed and ambulation.  Medication Inspection Compliance: Tonya [name] did not comply with our request to bring her pills to be counted. She was reminded that bringing the medication bottles, even when empty, is a requirement.  Medication: None brought in. Pill/Patch Count: None available to be counted. Bottle Appearance: No container available. Did not bring bottle(s) to appointment. Filled Date: N/A Last Medication intake:  Day before yesterdaySafety precautions to be maintained throughout the outpatient stay will include: orient to surroundings, keep bed in low position, maintain call bell within reach at all times, provide assistance with transfer out of bed and ambulation.

## 2020-06-07 NOTE — Progress Notes (Signed)
PROVIDER NOTE: Information contained herein reflects review and annotations entered in association with encounter. Interpretation of such information and data should be left to medically-trained personnel. Information provided to patient can be located elsewhere in the medical record under "Patient Instructions". Document created using STT-dictation technology, any transcriptional errors that may result from process are unintentional.    Patient: Tonya [name]  Service Category: E/M  Provider: Gillis Santa, MD  DOB: December 03, 1946  DOS: 06/07/2020  Specialty: Interventional Pain Management  MRN: [id number]  Setting: Ambulatory outpatient  PCP: [name], NP  Type: Established Patient    Referring Provider: [name], NP  Location: Office  Delivery: Face-to-face     HPI  Reason for encounter: [name], a 74 y.o. year old female, is here today for evaluation and management of her Lumbar facet arthropathy [M47.816]. [name] primary complain today is Back Pain Last encounter: Practice (03/07/2020). My last encounter with her was on 03/07/2020. Pertinent problems: [name] has Chronic pain syndrome; Neuropathic pain; Lumbar degenerative disc disease; and Lumbar facet arthropathy on their pertinent problem list. Pain Assessment: Severity of Chronic pain is reported as a 5 /10. Location: Back Lower/Denies. Onset: More than a month ago. Quality: Pressure, Burning. Timing: Intermittent. Modifying factor(s): nothing. Vitals:  height is _0  (1.702 m) and weight is 177 lb (80.3 kg). Her temperature is 97 F (36.1 C) (abnormal). Her blood pressure is 129/75 and her pulse is 78. Her oxygen saturation is 100%.   Presents today for right low back pain that radiates into right lateral hip.  Reviewed SI joint x-ray which was largely unremarkable the patient does have lower lumbar degenerative disc disease and lumbar facet arthropathy.  She is not finding benefit with tramadol which she is  taking at night.  In fact it is causing her to have vivid dreams.  I have informed the patient to discontinue this medication, can consider low-dose hydrocodone as needed for severe pain.     Pharmacotherapy Assessment   04/18/2020  1   04/18/2020  Tramadol Hcl 50 MG Tablet  60.00  30 Bi Lat   [id number]   [facility] ([id number])   0  10.00 MME  Medicare   [place]      Monitoring: [place] PMP: PDMP reviewed during this encounter.       Pharmacotherapy: No side-effects or adverse reactions reported. Compliance: No problems identified. Effectiveness: not effective, dc  UDS:  Summary  Date Value Ref Range Status  04/18/2020 Note  Final    Comment:    ==================================================================== Compliance Drug Analysis, Ur ==================================================================== Test                             Result       Flag       Units Drug Present   Naproxen                       PRESENT ==================================================================== Test                      Result    Flag   Units      Ref Range   Creatinine              216              mg/dL      >=20 ==================================================================== Declared Medications:  Medication list was not  provided. ==================================================================== For clinical consultation, please call [phone]. ====================================================================       ROS  Constitutional: Denies any fever or chills Gastrointestinal: No reported hemesis, hematochezia, vomiting, or acute GI distress Musculoskeletal: Denies any acute onset joint swelling, redness, loss of ROM, or weakness Neurological: No reported episodes of acute onset apraxia, aphasia, dysarthria, agnosia, amnesia, paralysis, loss of coordination, or loss of consciousness  Medication Review  Capsaicin, DULoxetine, HYDROcodone-acetaminophen, Vitamin D  (Cholecalciferol), aspirin, carvedilol, clopidogrel, ferrous sulfate, furosemide, insulin detemir, insulin lispro, olmesartan, omeprazole, pioglitazone, and rosuvastatin  History Review  Allergy: [name] is allergic to atorvastatin, gabapentin, pravastatin, pregabalin, and trulicity [dulaglutide]. Drug: [name]  reports no history of drug use. Alcohol:  reports no history of alcohol use. Tobacco:  reports that she has never smoked. She has never used smokeless tobacco. Social: [name]  reports that she has never smoked. She has never used smokeless tobacco. She reports that she does not drink alcohol and does not use drugs. Medical:  has a past medical history of (HFpEF) heart failure with preserved ejection fraction ([place]), CKD (chronic kidney disease), stage III, Diabetes mellitus without complication ([place]), Hyperlipidemia, Hypertension, and Stroke ([place]). Surgical: [name]  has a past surgical history that includes Tubal ligation; Colonoscopy (12/15/2006); Colonoscopy with propofol (N/A, 06/06/2019); Colonoscopy with propofol (N/A, 01/16/2020); and polypectomy (N/A, 01/16/2020). Family: family history includes Diabetes in her brother, brother, mother, and sister; Gout in her son; Heart attack in her sister; Hyperlipidemia in her father; Hypertension in her father; Kidney disease in her son; Lymphoma in her sister; Stroke in her mother.  Laboratory Chemistry Profile   Renal Lab Results  Component Value Date   BUN 50 (H) 06/04/2020   CREATININE 2.36 (H) 06/04/2020   BCR 21 06/04/2020   GFRAA 23 (L) 06/04/2020   GFRNONAA 20 (L) 06/04/2020     Hepatic Lab Results  Component Value Date   AST 21 12/02/2019   ALT 14 12/02/2019   ALBUMIN 3.1 (L) 12/02/2019   ALKPHOS 73 12/02/2019     Electrolytes Lab Results  Component Value Date   NA 146 (H) 06/04/2020   K 4.6 06/04/2020   CL 113 (H) 06/04/2020   CALCIUM 8.8 06/04/2020   MG 2.3 12/03/2019     Bone Lab Results  Component Value  Date   VD25OH 26.9 (L) 05/18/2019     Inflammation (CRP: Acute Phase) (ESR: Chronic Phase) Lab Results  Component Value Date   CRP 1.1 04/14/2018   ESRSEDRATE 14 04/14/2018       Note: Above Lab results reviewed.  Recent Imaging Review  DG Si Joints CLINICAL DATA:  Bilateral sacroiliac joint pain  EXAM: BILATERAL SACROILIAC JOINTS - 3+ VIEW  COMPARISON:  None.  FINDINGS: The sacroiliac joint spaces are maintained and there is no evidence of arthropathy. Degenerative changes noted in the lower lumbar spine and bilateral hips. Vascular calcium in the pelvis. Bilateral tubal ligation clips are noted.  IMPRESSION: No evidence for sacroiliitis. Degenerative changes in the lower lumbar spine and bilateral hips.  Electronically Signed   By: Lovena Le M.D.   On: 04/19/2020 02:47 Note: Reviewed        Physical Exam  General appearance: Well nourished, well developed, and well hydrated. In no apparent acute distress Mental status: Alert, oriented x 3 (person, place, & time)       Respiratory: No evidence of acute respiratory distress Eyes: PERLA Vitals: BP 129/75   Pulse 78   Temp (!) 97  F (36.1 C)   Ht _0  (1.702 m)   Wt 177 lb (80.3 kg)   LMP  (LMP Unknown)   SpO2 100%   BMI 27.72 kg/m  BMI: Estimated body mass index is 27.72 kg/m as calculated from the following:   Height as of this encounter: _1  (1.702 m).   Weight as of this encounter: 177 lb (80.3 kg). Ideal: Ideal body weight: 61.6 kg (135 lb 12.9 oz) Adjusted ideal body weight: 69.1 kg (152 lb 4.5 oz)  Lumbar Spine Area Exam  Skin & Axial Inspection: No masses, redness, or swelling Alignment: Symmetrical Functional ROM: Pain restricted ROM affecting primarily the right Stability: No instability detected Muscle Tone/Strength: Functionally intact. No obvious neuro-muscular anomalies detected. Sensory (Neurological): Musculoskeletal pain pattern Palpation: Complains of area being tender to  palpation       Provocative Tests: Hyperextension/rotation test: (+) on the right for facet joint pain. Lumbar quadrant test (Kemp's test): (+) on the right for facet joint pain. Lateral bending test: deferred today       Patrick's Maneuver: deferred today                   FABER* test: deferred today                   S-I anterior distraction/compression test: deferred today         S-I lateral compression test: deferred today         S-I Thigh-thrust test: deferred today         S-I Gaenslen's test: deferred today         *(Flexion, ABduction and External Rotation) Gait & Posture Assessment  Ambulation: Limited Gait: Significantly limited. Dependent on assistive device to ambulate Posture: Difficulty standing up straight, due to pain  Lower Extremity Exam    Side: Right lower extremity  Side: Left lower extremity  Stability: No instability observed          Stability: No instability observed          Skin & Extremity Inspection: Skin color, temperature, and hair growth are WNL. No peripheral edema or cyanosis. No masses, redness, swelling, asymmetry, or associated skin lesions. No contractures.  Skin & Extremity Inspection: Skin color, temperature, and hair growth are WNL. No peripheral edema or cyanosis. No masses, redness, swelling, asymmetry, or associated skin lesions. No contractures.  Functional ROM: Pain restricted ROM                  Functional ROM: Unrestricted ROM                  Muscle Tone/Strength: Functionally intact. No obvious neuro-muscular anomalies detected.  Muscle Tone/Strength: Functionally intact. No obvious neuro-muscular anomalies detected.  Sensory (Neurological): Unimpaired        Sensory (Neurological): Unimpaired        DTR: Patellar: deferred today Achilles: deferred today Plantar: deferred today  DTR: Patellar: deferred today Achilles: deferred today Plantar: deferred today  Palpation: No palpable anomalies  Palpation: No palpable anomalies     Assessment   Status Diagnosis  Having a Flare-up Having a Flare-up Persistent 1. Lumbar facet arthropathy   2. Lumbar spondylosis   3. Lumbar degenerative disc disease   4. Post herpetic neuralgia   5. Neuropathic pain   6. Acute cerebrovascular accident (CVA) ([place])   7. Chronic pain syndrome      Updated Problems: Problem  Neuropathic Pain  Lumbar Degenerative Disc Disease  Lumbar Facet Arthropathy  Chronic Pain Syndrome    Plan of Care   [name] has a current medication list which includes the following long-term medication(s): carvedilol, ferrous sulfate, omeprazole, rosuvastatin, and duloxetine.  Tonya [name] has a history of greater than 3 months of moderate to severe pain which is resulted in functional impairment.  The patient has tried various conservative therapeutic options such as NSAIDs, Tylenol, muscle relaxants, physical therapy which was inadequately effective.  Patient's pain is predominantly axial with physical exam findings suggestive of facet arthropathy. Lumbar facet medial branch nerve blocks were discussed with the patient.  Risks and benefits were reviewed.  Patient would like to proceed with RIGHT L3, L4, L5 medial branch nerve block.  I have spoken with [name], pt's PCP, to get clearance to stop Plavix for 7 days prior to her scheduled procedure.  This has been relayed to the patient.  In regards to medication management, patient has tried and failed gabapentin, Lyrica, Cymbalta, tramadol.  Do not recommend NSAIDs in the context of her being on Plavix.  Avoid muscle relaxers given risk of sedation and fall.  Trial of hydrocodone as below.   Pharmacotherapy (Medications Ordered): Meds ordered this encounter  Medications  . HYDROcodone-acetaminophen (NORCO/VICODIN) 5-325 MG tablet    Sig: Take 1 tablet by mouth 2 (two) times daily as needed for severe pain. Must last 30 days.    Dispense:  30 tablet    Refill:  0    Chronic  Pain. (STOP Act - Not applicable). Fill one day early if closed on scheduled refill date.   Orders:  Orders Placed This Encounter  Procedures  . LUMBAR FACET(MEDIAL BRANCH NERVE BLOCK) MBNB    Standing Status:   Future    Standing Expiration Date:   07/07/2020    Scheduling Instructions:     Procedure: Lumbar facet block (AKA.: Lumbosacral medial branch nerve block)     Side: Right     Level: L3-4, L4-5,Facets ( L3, L4, L5,Medial Branch Nerves)     Sedation: without     Timeframe: ASAA          Blood thinner protocol stop Plavix 7 days prior    Order Specific Question:   Where will this procedure be performed?    Answer:   [facility]   Follow-up plan:   Return in about 3 weeks (around 06/28/2020) for R L3,4,5 Fct (stop Plavix 7 days prior).     Postherpetic neuralgia: Failed gabapentin, Lyrica, Cymbalta, tramadol.    Recent Visits Date Type Provider Dept  04/18/20 Telemedicine Gillis Santa, MD [facility]  Showing recent visits within past 90 days and meeting all other requirements Today's Visits Date Type Provider Dept  06/07/20 Office Visit Gillis Santa, MD [facility]  Showing today's visits and meeting all other requirements Future Appointments No visits were found meeting these conditions. Showing future appointments within next 90 days and meeting all other requirements  I discussed the assessment and treatment plan with the patient. The patient was provided an opportunity to ask questions and all were answered. The patient agreed with the plan and demonstrated an understanding of the instructions.  Patient advised to call back or seek an in-person evaluation if the symptoms or condition worsens.  Duration of encounter: 30 minutes.  Note by: Gillis Santa, MD Date: 06/07/2020; Time: 2:29 PM

## 2020-06-12 ENCOUNTER — Encounter: Payer: Medicare Other | Admitting: Student in an Organized Health Care Education/Training Program

## 2020-06-18 DIAGNOSIS — N2581 Secondary hyperparathyroidism of renal origin: Secondary | ICD-10-CM | POA: Diagnosis not present

## 2020-06-18 DIAGNOSIS — I129 Hypertensive chronic kidney disease with stage 1 through stage 4 chronic kidney disease, or unspecified chronic kidney disease: Secondary | ICD-10-CM | POA: Diagnosis not present

## 2020-06-18 DIAGNOSIS — N184 Chronic kidney disease, stage 4 (severe): Secondary | ICD-10-CM | POA: Diagnosis not present

## 2020-06-18 DIAGNOSIS — D631 Anemia in chronic kidney disease: Secondary | ICD-10-CM | POA: Diagnosis not present

## 2020-06-18 DIAGNOSIS — R809 Proteinuria, unspecified: Secondary | ICD-10-CM | POA: Diagnosis not present

## 2020-06-19 ENCOUNTER — Other Ambulatory Visit: Payer: Self-pay | Admitting: Nurse Practitioner

## 2020-06-19 NOTE — Telephone Encounter (Signed)
Routing to provider  

## 2020-06-19 NOTE — Telephone Encounter (Signed)
Requested medication (s) are due for refill today: no  Requested medication (s) are on the active medication list: no  Last refill:  discontinued on 12/01/2019  Future visit scheduled: yes  Notes to clinic:  Please review for refill. 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 ([place])   [place], [name] T, NP   2 months ago Poorly controlled type 2 diabetes mellitus with neuropathy ([place])   [place], [name] T, NP   5 months ago Uncontrolled type 2 diabetes mellitus with hyperglycemia, with long-term current use of insulin ([place])   [place], [name] T, NP   6 months ago 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

## 2020-06-26 ENCOUNTER — Telehealth: Payer: Self-pay | Admitting: Nurse Practitioner

## 2020-06-26 ENCOUNTER — Telehealth: Payer: Self-pay

## 2020-06-26 NOTE — Chronic Care Management (AMB) (Signed)
  Care Management   Note  06/26/2020 Name: Tonya [name] MRN: [id number] DOB: 02-20-1946  Tonya [name] is a 74 y.o. year old female who is a primary care patient of [name], NP and is actively engaged with the care management team. I reached out to [name] by phone today to assist with re-scheduling a follow up visit with the RN Case Manager  Follow up plan: Telephone appointment with care management team member scheduled for: 07/24/2020  [place], [place] [facility]  [place],  [id number] Direct Dial: [email or web address]: [email or web address]

## 2020-06-27 ENCOUNTER — Encounter: Payer: Self-pay | Admitting: Student in an Organized Health Care Education/Training Program

## 2020-06-27 ENCOUNTER — Ambulatory Visit (HOSPITAL_BASED_OUTPATIENT_CLINIC_OR_DEPARTMENT_OTHER): Payer: Medicare Other | Admitting: Student in an Organized Health Care Education/Training Program

## 2020-06-27 ENCOUNTER — Ambulatory Visit
Admission: RE | Admit: 2020-06-27 | Discharge: 2020-06-27 | Disposition: A | Discharge status: Home / Self Care | Payer: Medicare Other | Source: Ambulatory Visit | Attending: Student in an Organized Health Care Education/Training Program | Admitting: Student in an Organized Health Care Education/Training Program

## 2020-06-27 ENCOUNTER — Other Ambulatory Visit: Payer: Self-pay

## 2020-06-27 VITALS — BP 195/89 | HR 84 | Temp 97.1°F | Resp 16 | Ht 67.0 in | Wt 173.0 lb

## 2020-06-27 DIAGNOSIS — M47816 Spondylosis without myelopathy or radiculopathy, lumbar region: Secondary | ICD-10-CM

## 2020-06-27 MED ORDER — ROPIVACAINE HCL 2 MG/ML IJ SOLN
9.0000 mL | Freq: Once | INTRAMUSCULAR | Status: AC
  Administered 2020-06-27: 10 mL via PERINEURAL

## 2020-06-27 MED ORDER — DEXAMETHASONE SODIUM PHOSPHATE 10 MG/ML IJ SOLN
10.0000 mg | Freq: Once | INTRAMUSCULAR | Status: AC
  Administered 2020-06-27: 10 mg

## 2020-06-27 MED ORDER — DEXAMETHASONE SODIUM PHOSPHATE 10 MG/ML IJ SOLN
INTRAMUSCULAR | Status: AC
  Filled 2020-06-27: qty 1

## 2020-06-27 MED ORDER — LIDOCAINE HCL 2 % IJ SOLN
20.0000 mL | Freq: Once | INTRAMUSCULAR | Status: AC
  Administered 2020-06-27: 200 mg

## 2020-06-27 MED ORDER — ROPIVACAINE HCL 2 MG/ML IJ SOLN
INTRAMUSCULAR | Status: AC
  Filled 2020-06-27: qty 10

## 2020-06-27 MED ORDER — LIDOCAINE HCL 2 % IJ SOLN
INTRAMUSCULAR | Status: AC
  Filled 2020-06-27: qty 10

## 2020-06-27 NOTE — Patient Instructions (Addendum)

## 2020-06-27 NOTE — Progress Notes (Signed)
PROVIDER NOTE: Information contained herein reflects review and annotations entered in association with encounter. Interpretation of such information and data should be left to medically-trained personnel. Information provided to patient can be located elsewhere in the medical record under "Patient Instructions". Document created using STT-dictation technology, any transcriptional errors that may result from process are unintentional.    Patient: Tonya [name]  Service Category: Procedure  Provider: Gillis Santa, MD  DOB: 1946-03-03  DOS: 06/27/2020  Location: [facility]  MRN: [id number]  Setting: Ambulatory - outpatient  Referring Provider: [name], NP  Type: Established Patient  Specialty: Interventional Pain Management  PCP: [name], NP   Primary Reason for Visit: Interventional Pain Management Treatment. CC: Procedure  Procedure:          Anesthesia, Analgesia, Anxiolysis:  Type: Lumbar Facet, Medial Branch Block(s) #1  Primary Purpose: Diagnostic Region: Posterolateral Lumbosacral Spine Level:  L3, L4, L5,  Medial Branch Level(s). Injecting these levels blocks the L3-4, L4-5 lumbar facet joints. Laterality: Right  Type: Local Anesthesia  Local Anesthetic: Lidocaine 1-2%  Position: Prone   Indications: 1. Lumbar facet arthropathy   2. Lumbar spondylosis    Pain Score: Pre-procedure: 2 /10 Post-procedure: 1 /10   Patient stopped Plavix 7 days prior to procedure.  Pre-op Assessment:  Tonya [name] is a 74 y.o. (year old), female patient, seen today for interventional treatment. She  has a past surgical history that includes Tubal ligation; Colonoscopy (12/15/2006); Colonoscopy with propofol (N/A, 06/06/2019); Colonoscopy with propofol (N/A, 01/16/2020); and polypectomy (N/A, 01/16/2020). Tonya [name] has a current medication list which includes the following prescription(s): aspirin, capsaicin, carvedilol, ferrous sulfate, furosemide, humalog kwikpen,  hydrocodone-acetaminophen, insulin detemir, novofine, olmesartan, omeprazole, pioglitazone, rosuvastatin, vitamin d (cholecalciferol), clopidogrel, and duloxetine. Her primarily concern today is the Procedure  Initial Vital Signs:  Pulse/HCG Rate: 84ECG Heart Rate: 76 Temp: (!) 97.1 F (36.2 C) Resp: 18 BP: (!) 153/94 SpO2: 100 %  BMI: Estimated body mass index is 27.1 kg/m as calculated from the following:   Height as of this encounter: 5\' 7"  (1.702 m).   Weight as of this encounter: 173 lb (78.5 kg).  Risk Assessment: Allergies: Reviewed. She is allergic to atorvastatin, gabapentin, pravastatin, pregabalin, and trulicity [dulaglutide].  Allergy Precautions: None required Coagulopathies: Reviewed. None identified.  Blood-thinner therapy: None at this time Active Infection(s): Reviewed. None identified. Tonya [name] is afebrile  Site Confirmation: Tonya [name] was asked to confirm the procedure and laterality before marking the site Procedure checklist: Completed Consent: Before the procedure and under the influence of no sedative(s), amnesic(s), or anxiolytics, the patient was informed of the treatment options, risks and possible complications. To fulfill our ethical and legal obligations, as recommended by the American Medical Association's Code of Ethics, I have informed the patient of my clinical impression; the nature and purpose of the treatment or procedure; the risks, benefits, and possible complications of the intervention; the alternatives, including doing nothing; the risk(s) and benefit(s) of the alternative treatment(s) or procedure(s); and the risk(s) and benefit(s) of doing nothing. The patient was provided information about the general risks and possible complications associated with the procedure. These may include, but are not limited to: failure to achieve desired goals, infection, bleeding, organ or nerve damage, allergic reactions, paralysis, and death. In addition, the  patient was informed of those risks and complications associated to Spine-related procedures, such as failure to decrease pain; infection (i.e.: Meningitis, epidural or intraspinal abscess); bleeding (i.e.: epidural hematoma, subarachnoid hemorrhage,  or any other type of intraspinal or peri-dural bleeding); organ or nerve damage (i.e.: Any type of peripheral nerve, nerve root, or spinal cord injury) with subsequent damage to sensory, motor, and/or autonomic systems, resulting in permanent pain, numbness, and/or weakness of one or several areas of the body; allergic reactions; (i.e.: anaphylactic reaction); and/or death. Furthermore, the patient was informed of those risks and complications associated with the medications. These include, but are not limited to: allergic reactions (i.e.: anaphylactic or anaphylactoid reaction(s)); adrenal axis suppression; blood sugar elevation that in diabetics may result in ketoacidosis or comma; water retention that in patients with history of congestive heart failure may result in shortness of breath, pulmonary edema, and decompensation with resultant heart failure; weight gain; swelling or edema; medication-induced neural toxicity; particulate matter embolism and blood vessel occlusion with resultant organ, and/or nervous system infarction; and/or aseptic necrosis of one or more joints. Finally, the patient was informed that Medicine is not an exact science; therefore, there is also the possibility of unforeseen or unpredictable risks and/or possible complications that may result in a catastrophic outcome. The patient indicated having understood very clearly. We have given the patient no guarantees and we have made no promises. Enough time was given to the patient to ask questions, all of which were answered to the patient's satisfaction. Tonya [name] has indicated that she wanted to continue with the procedure. Attestation: I, the ordering provider, attest that I have discussed  with the patient the benefits, risks, side-effects, alternatives, likelihood of achieving goals, and potential problems during recovery for the procedure that I have provided informed consent. Date  Time: 06/27/2020  9:39 AM  Pre-Procedure Preparation:  Monitoring: As per clinic protocol. Respiration, ETCO2, SpO2, BP, heart rate and rhythm monitor placed and checked for adequate function Safety Precautions: Patient was assessed for positional comfort and pressure points before starting the procedure. Time-out: I initiated and conducted the "Time-out" before starting the procedure, as per protocol. The patient was asked to participate by confirming the accuracy of the "Time Out" information. Verification of the correct person, site, and procedure were performed and confirmed by me, the nursing staff, and the patient. "Time-out" conducted as per Joint Commission's Universal Protocol (UP.01.01.01). Time: 1013  Description of Procedure:          Laterality: Right Levels:   L3, L4, L5,  Medial Branch Level(s) Area Prepped: Posterior Lumbosacral Region DuraPrep (Iodine Povacrylex [0.7% available iodine] and Isopropyl Alcohol, 74% w/w) Safety Precautions: Aspiration looking for blood return was conducted prior to all injections. At no point did we inject any substances, as a needle was being advanced. Before injecting, the patient was told to immediately notify me if she was experiencing any new onset of "ringing in the ears, or metallic taste in the mouth". No attempts were made at seeking any paresthesias. Safe injection practices and needle disposal techniques used. Medications properly checked for expiration dates. SDV (single dose vial) medications used. After the completion of the procedure, all disposable equipment used was discarded in the proper designated medical waste containers. Local Anesthesia: Protocol guidelines were followed. The patient was positioned over the fluoroscopy table. The area  was prepped in the usual manner. The time-out was completed. The target area was identified using fluoroscopy. A 12-in long, straight, sterile hemostat was used with fluoroscopic guidance to locate the targets for each level blocked. Once located, the skin was marked with an approved surgical skin marker. Once all sites were marked, the skin (epidermis, dermis, and hypodermis),  as well as deeper tissues (fat, connective tissue and muscle) were infiltrated with a small amount of a short-acting local anesthetic, loaded on a 10cc syringe with a 25G, 1.5-in  Needle. An appropriate amount of time was allowed for local anesthetics to take effect before proceeding to the next step. Local Anesthetic: Lidocaine 2.0% The unused portion of the local anesthetic was discarded in the proper designated containers.  Technical explanation of process:  L3 Medial Branch Nerve Block (MBB): The target area for the L3 medial branch is at the junction of the postero-lateral aspect of the superior articular process and the superior, posterior, and medial edge of the transverse process of L4. Under fluoroscopic guidance, a Quincke needle was inserted until contact was made with os over the superior postero-lateral aspect of the pedicular shadow (target area). After negative aspiration for blood, 2 mL of the nerve block solution was injected without difficulty or complication. The needle was removed intact. L4 Medial Branch Nerve Block (MBB): The target area for the L4 medial branch is at the junction of the postero-lateral aspect of the superior articular process and the superior, posterior, and medial edge of the transverse process of L5. Under fluoroscopic guidance, a Quincke needle was inserted until contact was made with os over the superior postero-lateral aspect of the pedicular shadow (target area). After negative aspiration for blood, 2 mL of the nerve block solution was injected without difficulty or complication. The needle  was removed intact. L5 Medial Branch Nerve Block (MBB): The target area for the L5 medial branch is at the junction of the postero-lateral aspect of the superior articular process and the superior, posterior, and medial edge of the sacral ala. Under fluoroscopic guidance, a Quincke needle was inserted until contact was made with os over the superior postero-lateral aspect of the pedicular shadow (target area). After negative aspiration for blood, 27mL of the nerve block solution was injected without difficulty or complication. The needle was removed intact.  Nerve block solution: 10 cc solution made of 9 cc of 0.2% ropivacaine, 1 cc of Decadron 10 mg/cc.  2 cc injected at each level above on the right.  the unused portion of the solution was discarded in the proper designated containers. Procedural Needles: 22-gauge, 3.5-inch, Quincke needles used for all levels.  Once the entire procedure was completed, the treated area was cleaned, making sure to leave some of the prepping solution back to take advantage of its long term bactericidal properties.   Illustration of the posterior view of the lumbar spine and the posterior neural structures. Laminae of L2 through S1 are labeled. DPRL5, dorsal primary ramus of L5; DPRS1, dorsal primary ramus of S1; DPR3, dorsal primary ramus of L3; FJ, facet (zygapophyseal) joint L3-L4; I, inferior articular process of L4; LB1, lateral branch of dorsal primary ramus of L1; IAB, inferior articular branches from L3 medial branch (supplies L4-L5 facet joint); IBP, intermediate branch plexus; MB3, medial branch of dorsal primary ramus of L3; NR3, third lumbar nerve root; S, superior articular process of L5; SAB, superior articular branches from L4 (supplies L4-5 facet joint also); TP3, transverse process of L3.  Vitals:   06/27/20 0942 06/27/20 1013 06/27/20 1017 06/27/20 1021  BP: (!) 153/94 (!) 191/95 (!) 198/103 (!) 195/89  Pulse: 84     Resp: 18 16 14 16   Temp: (!) 97.1  F (36.2 C)     TempSrc: Temporal     SpO2: 100% 98% 98% 98%  Weight: 173 lb (78.5 kg)  Height: 5\' 7"  (1.702 m)        Start Time: 1013 hrs. End Time: 1020 hrs.  Imaging Guidance (Spinal):          Type of Imaging Technique: Fluoroscopy Guidance (Spinal) Indication(s): Assistance in needle guidance and placement for procedures requiring needle placement in or near specific anatomical locations not easily accessible without such assistance. Exposure Time: Please see nurses notes. Contrast: None used. Fluoroscopic Guidance: I was personally present during the use of fluoroscopy. "Tunnel Vision Technique" used to obtain the best possible view of the target area. Parallax error corrected before commencing the procedure. "Direction-depth-direction" technique used to introduce the needle under continuous pulsed fluoroscopy. Once target was reached, antero-posterior, oblique, and lateral fluoroscopic projection used confirm needle placement in all planes. Images permanently stored in EMR. Interpretation: No contrast injected. I personally interpreted the imaging intraoperatively. Adequate needle placement confirmed in multiple planes. Permanent images saved into the patient's record.  Antibiotic Prophylaxis:   Anti-infectives (From admission, onward)   None     Indication(s): None identified  Post-operative Assessment:  Post-procedure Vital Signs:  Pulse/HCG Rate: 8478 Temp: (!) 97.1 F (36.2 C) Resp: 16 BP: (!) 195/89 SpO2: 98 %  EBL: None  Complications: No immediate post-treatment complications observed by team, or reported by patient.  Note: The patient tolerated the entire procedure well. A repeat set of vitals were taken after the procedure and the patient was kept under observation following institutional policy, for this type of procedure. Post-procedural neurological assessment was performed, showing return to baseline, prior to discharge. The patient was provided with  post-procedure discharge instructions, including a section on how to identify potential problems. Should any problems arise concerning this procedure, the patient was given instructions to immediately contact us, at any time, without hesitation. In any case, we plan to contact the patient by telephone for a follow-up status report regarding this interventional procedure.  Comments:  No additional relevant information.  Plan of Care  Orders:  Orders Placed This Encounter  Procedures  . DG PAIN [facility] C-ARM 1-60 MIN NO REPORT    Intraoperative interpretation by procedural physician at [facility].    Standing Status:   Standing    Number of Occurrences:   1    Order Specific Question:   Reason for exam:    Answer:   Assistance in needle guidance and placement for procedures requiring needle placement in or near specific anatomical locations not easily accessible without such assistance.   Medications ordered for procedure: Meds ordered this encounter  Medications  . lidocaine (XYLOCAINE) 2 % (with pres) injection 400 mg  . ropivacaine (PF) 2 mg/mL (0.2%) (NAROPIN) injection 9 mL  . dexamethasone (DECADRON) injection 10 mg   Medications administered: We administered lidocaine, ropivacaine (PF) 2 mg/mL (0.2%), and dexamethasone.  See the medical record for exact dosing, route, and time of administration.  Follow-up plan:   Return in about 4 weeks (around 07/25/2020) for Post Procedure Evaluation, in person.      Postherpetic neuralgia: Failed gabapentin, Lyrica, Cymbalta, tramadol.     Recent Visits Date Type Provider Dept  06/07/20 Office Visit Gillis Santa, MD [facility]  04/18/20 Telemedicine Gillis Santa, MD [facility]  Showing recent visits within past 90 days and meeting all other requirements Today's Visits Date Type Provider Dept  06/27/20 Procedure visit Gillis Santa, MD [facility]  Showing today's visits and meeting all other  requirements Future Appointments Date Type Provider Dept  07/26/20  Appointment Gillis Santa, MD [facility]  Showing future appointments within next 90 days and meeting all other requirements  Disposition: Discharge home  Discharge (Date  Time): 06/27/2020; 1030 hrs.   Primary Care Physician: [name], NP Location: [facility] Note by: Gillis Santa, MD Date: 06/27/2020; Time: 11:12 AM  Disclaimer:  Medicine is not an exact science. The only guarantee in medicine is that nothing is guaranteed. It is important to note that the decision to proceed with this intervention was based on the information collected from the patient. The Data and conclusions were drawn from the patient's questionnaire, the interview, and the physical examination. Because the information was provided in large part by the patient, it cannot be guaranteed that it has not been purposely or unconsciously manipulated. Every effort has been made to obtain as much relevant data as possible for this evaluation. It is important to note that the conclusions that lead to this procedure are derived in large part from the available data. Always take into account that the treatment will also be dependent on availability of resources and existing treatment guidelines, considered by other Pain Management Practitioners as being common knowledge and practice, at the time of the intervention. For Medico-Legal purposes, it is also important to point out that variation in procedural techniques and pharmacological choices are the acceptable norm. The indications, contraindications, technique, and results of the above procedure should only be interpreted and judged by a Board-Certified Interventional Pain Specialist with extensive familiarity and expertise in the same exact procedure and technique.

## 2020-06-28 ENCOUNTER — Telehealth: Payer: Self-pay | Admitting: *Deleted

## 2020-06-28 NOTE — Telephone Encounter (Signed)
No problems post procedure. 

## 2020-07-09 DIAGNOSIS — Z794 Long term (current) use of insulin: Secondary | ICD-10-CM | POA: Diagnosis not present

## 2020-07-09 DIAGNOSIS — E113393 Type 2 diabetes mellitus with moderate nonproliferative diabetic retinopathy without macular edema, bilateral: Secondary | ICD-10-CM | POA: Diagnosis not present

## 2020-07-09 LAB — HEMOGLOBIN A1C: Hemoglobin A1C: 7.6

## 2020-07-23 DIAGNOSIS — E113393 Type 2 diabetes mellitus with moderate nonproliferative diabetic retinopathy without macular edema, bilateral: Secondary | ICD-10-CM | POA: Diagnosis not present

## 2020-07-23 DIAGNOSIS — E1121 Type 2 diabetes mellitus with diabetic nephropathy: Secondary | ICD-10-CM | POA: Diagnosis not present

## 2020-07-23 DIAGNOSIS — I10 Essential (primary) hypertension: Secondary | ICD-10-CM | POA: Diagnosis not present

## 2020-07-23 DIAGNOSIS — E1122 Type 2 diabetes mellitus with diabetic chronic kidney disease: Secondary | ICD-10-CM | POA: Diagnosis not present

## 2020-07-23 DIAGNOSIS — Z794 Long term (current) use of insulin: Secondary | ICD-10-CM | POA: Diagnosis not present

## 2020-07-24 ENCOUNTER — Telehealth: Payer: Self-pay

## 2020-07-24 ENCOUNTER — Telehealth: Payer: Self-pay | Admitting: *Deleted

## 2020-07-24 NOTE — Chronic Care Management (AMB) (Signed)
  Chronic Care Management   Note  07/24/2020 Name: Tonya [name] MRN: [id number] DOB: 05-26-1946  Tonya [name] is a 74 y.o. year old female who is a primary care patient of [name], NP and is actively engaged with the care management team. I reached out to [name] by phone today to assist with re-scheduling a follow up visit with the RN Case Manager.  Follow up plan: Telephone appointment with care management team member scheduled for: 08/17/20  [facility]  [place], [place] [id number] Direct Dial: [email or web address] Website: [email or web address]

## 2020-07-25 ENCOUNTER — Other Ambulatory Visit: Payer: Self-pay

## 2020-07-25 ENCOUNTER — Ambulatory Visit (INDEPENDENT_AMBULATORY_CARE_PROVIDER_SITE_OTHER): Payer: Medicare Other | Admitting: General Practice

## 2020-07-25 ENCOUNTER — Encounter: Payer: Self-pay | Admitting: Nurse Practitioner

## 2020-07-25 ENCOUNTER — Ambulatory Visit (INDEPENDENT_AMBULATORY_CARE_PROVIDER_SITE_OTHER): Payer: Medicare Other | Admitting: Nurse Practitioner

## 2020-07-25 VITALS — BP 134/70 | HR 81 | Temp 98.7°F | Wt 173.8 lb

## 2020-07-25 DIAGNOSIS — Z794 Long term (current) use of insulin: Secondary | ICD-10-CM

## 2020-07-25 DIAGNOSIS — E785 Hyperlipidemia, unspecified: Secondary | ICD-10-CM | POA: Diagnosis not present

## 2020-07-25 DIAGNOSIS — Z1231 Encounter for screening mammogram for malignant neoplasm of breast: Secondary | ICD-10-CM

## 2020-07-25 DIAGNOSIS — E1140 Type 2 diabetes mellitus with diabetic neuropathy, unspecified: Secondary | ICD-10-CM

## 2020-07-25 DIAGNOSIS — E1165 Type 2 diabetes mellitus with hyperglycemia: Secondary | ICD-10-CM | POA: Diagnosis not present

## 2020-07-25 DIAGNOSIS — N184 Chronic kidney disease, stage 4 (severe): Secondary | ICD-10-CM

## 2020-07-25 DIAGNOSIS — I5032 Chronic diastolic (congestive) heart failure: Secondary | ICD-10-CM

## 2020-07-25 DIAGNOSIS — E1169 Type 2 diabetes mellitus with other specified complication: Secondary | ICD-10-CM

## 2020-07-25 DIAGNOSIS — I1310 Hypertensive heart and chronic kidney disease without heart failure, with stage 1 through stage 4 chronic kidney disease, or unspecified chronic kidney disease: Secondary | ICD-10-CM

## 2020-07-25 DIAGNOSIS — B0229 Other postherpetic nervous system involvement: Secondary | ICD-10-CM

## 2020-07-25 DIAGNOSIS — I5189 Other ill-defined heart diseases: Secondary | ICD-10-CM

## 2020-07-25 DIAGNOSIS — D631 Anemia in chronic kidney disease: Secondary | ICD-10-CM

## 2020-07-25 DIAGNOSIS — N2581 Secondary hyperparathyroidism of renal origin: Secondary | ICD-10-CM

## 2020-07-25 NOTE — Assessment & Plan Note (Signed)
Chronic, ongoing, followed by endocrinology.  Recent A1C 7.6% with endo. Continue current medication regimen and consider reduction if increased hypoglycemic episodes.  Cardiology and PCP have recommended to endo discontinuing Actos due to her HF.  Continue to collaborate with Dr. [name] and CCM team.  Recommend she monitor BS at least 3 times a day at home, working on [place] which will offer great benefit to her.  Return in 3 months.

## 2020-07-25 NOTE — Patient Instructions (Signed)

## 2020-07-25 NOTE — Patient Instructions (Signed)
Visit Information  Goals Addressed            This Visit's Progress   . RNCM: I have a lot of health problems but I am doing okay       Current Barriers:  . Chronic Disease Management support, education, and care coordination needs related to CHF, HTN, HLD, DMII, and CKD Stage 4  Clinical Goal(s) related to CHF, HTN, HLD, DMII, and CKD Stage 4 :  Over the next 120 days, patient will:  . Work with the care management team to address educational, disease management, and care coordination needs  . Begin or continue self health monitoring activities as directed today Measure and record cbg (blood glucose) bid times daily, Measure and record blood pressure 5 times per week, and Measure and record weight daily- currently does not have a scale . Call provider office for new or worsened signs and symptoms Blood glucose findings outside established parameters, Blood pressure findings outside established parameters, Weight outside established parameters, Shortness of breath, and New or worsened symptom related to CKD 4 . Call care management team with questions or concerns . Verbalize basic understanding of patient centered plan of care established today  Interventions related to CHF, HTN, HLD, DMII, and CKD Stage 4 :  . Evaluation of current treatment plans and patient's adherence to plan as established by provider . Assessed patient understanding of disease states.  The patient verbalized understanding of disease processes.  States compliance with medications.  . Assessed patient's education and care coordination needs . Provided disease specific education to patient. Education on following a heart healthy/ADA diet. The patient states she does not use salt.  Her hemoglobin A1C is 8.5 and indicates uncontrolled diabetes.  Education on the benefits of following a heart healthy/ADA diet.  Will send educational material to the patient concerning carb modified diet and also DASH diet. 07-25-2020: In office  visit with the pcp. The patients hemoglobin A1C is down to 7.6%. Praised for making progress. Saw endocrinologist on 07-23-2020 and is working on getting the [name].  The patient has been trying to get this for a while but admits it is probably her fault because she didn't get the paperwork done.  Reviewed and gave the patient written information on DM management with a goal of fasting blood sugars being <130 and post prandial <180.  The patient states this am her blood sugar was 87; however some mornings it is >160.  The patient states when she checks it at night it is 130-200's.  Review of hemoglobin A1c of 7.0% or less.  The patient wants to do well with managing her DM and other chronic conditions.  Nash Dimmer with appropriate clinical care team members regarding patient needs.  The patient is currently working with the pharmacist with medications management.  The patient endorses compliance. States she is taking her insulin and other medications as directed. The MD has filled out paperwork for her to get the [facility] continuous glucose meter. 07-25-2020: the patient is moving forward with getting her [facility] and also has a number to call to get medication assistance for her insulins.  . Assessed the patients ability to weigh daily and the patient verbalized she does now have a scale, (07-25-2020) the patient is currently enrolled in the case management program with her insurance company and has a scale and a blood pressure cuff. She is weighing daily and her weight is staying between 170-173.  Education provided. Review and explained the  importance of daily weights due to her chronic condition of heart failure and the need to call the provider for >2 pound weight gain over night or >5 in one week. The patient verbalized understanding.  . Educated on the importance of elevating feet and legs when sitting and wearing ted hose or socks (patient did not have compression socks on at the  time of the call). The patient verbalized she does have issues with swelling in her feet and legs.  The patient states that her swelling is not as bad when she wears sketchers shoes with the memory foam.  Discussed the importance of wearing ted hose to promote circulation. The patient verbalized understanding. 07-25-2020: The patient states that the swelling in her legs is better. The specialist has increased her Lasix. This is currently working well for her. Discussed the Sx/sx of dehydration and the importance of continued daily weights.  . Evaluation of blood pressure checks.   Education on taking blood pressure on a regular basis and writing values down for RNCM, CCM team and pcp so a pattern could be determined.  Education on elevated blood pressure putting the patient at increased risk and stroke. 07-25-2020: the patient brought her home cuff into the office today. The reading was 163/81 with her home cuff compared to 157/63 with the office machine. The patient with some elevation of blood pressure and states this is what it has been running at home. Educational material provided as well as review of low sodium food options. Provided a log book for the patient to record her blood pressure readings daily.  . Education on the importance of following the recommendations of her providers due to the multiple chronic conditions she has. Expressed concern over her health and well being.   . Review of upcoming appointments. Pcp appointment is 8/11 at 11 am. The patient has had her eye exam in June of 2021. Has had colonoscopy, and a referral for mammogram ordered today. The patient continues to see the specialist at the pain clinic for nerve pain related to past shingles.  . Review of CCM team help and provided the patient with contact information for the CCM team RNCM.     Patient Self Care Activities related to CHF, HTN, HLD, DMII, and CKD Stage 4 :  . Patient is unable to independently self-manage chronic  health conditions   Please see past updates related to this goal by clicking on the "Past Updates" button in the selected goal         Patient verbalizes understanding of instructions provided today.   Telephone follow up appointment with care management team member scheduled for: 08-17-2020 at 74 PM  [place], MSN, [facility] Mobile: [phone]

## 2020-07-25 NOTE — Chronic Care Management (AMB) (Signed)
Chronic Care Management   Follow Up Note   07/25/2020 Name: Tonya [name] MRN: [id number] DOB: [birth year]  Referred by: [name], NP Reason for referral : Chronic Care Management (face to face visit with the patient in the office for Chronic Disease managment and Care coordination needs)   Tonya [name] is a 74 y.o. year old female who is a primary care patient of Cannady, [name], NP. The CCM team was consulted for assistance with chronic disease management and care coordination needs.    Review of patient status, including review of consultants reports, relevant laboratory and other test results, and collaboration with appropriate care team members and the patient's provider was performed as part of comprehensive patient evaluation and provision of chronic care management services.    SDOH (Social Determinants of Health) assessments performed: Yes See Care Plan activities for detailed interventions related to [facility])     Outpatient Encounter Medications as of 07/25/2020  Medication Sig Note  . aspirin 81 MG tablet Take 81 mg by mouth daily.   . carvedilol (COREG) 3.125 MG tablet TAKE 1 TABLET BY MOUTH  TWICE DAILY WITH A MEAL   . clopidogrel (PLAVIX) 75 MG tablet TAKE 1 TABLET BY MOUTH  DAILY 06/27/2020: Stopped taking plavix on 06/19/20 for procedure on 06/27/20  . DULoxetine (CYMBALTA) 20 MG capsule Take 1 capsule (20 mg total) by mouth daily.   . ferrous sulfate 325 (65 FE) MG EC tablet Take 1 tablet (325 mg total) by mouth 3 (three) times daily with meals. 01/11/2020: Taking twice daily   . furosemide (LASIX) 40 MG tablet Take 40 mg by mouth daily.   [name] HUMALOG KWIKPEN 100 UNIT/ML KiwkPen TK 12 UNITS BEFORE LUNCH AND 8 UNITS BEFORE SUPPER. 03/09/2020: 6 units  . insulin detemir (LEVEMIR) 100 UNIT/ML injection Inject 10 Units into the skin every morning.    [name] NOVOFINE 32G X 6 MM MISC USE AS DIRECTED THREE TIMES DAILY WITH HUMALOG   . olmesartan (BENICAR) 40 MG tablet Take 40 mg  by mouth daily.   [name] omeprazole (PRILOSEC) 20 MG capsule TAKE 1 CAPSULE BY MOUTH  TWICE DAILY   . pioglitazone (ACTOS) 30 MG tablet Take 30 mg by mouth daily.   . rosuvastatin (CRESTOR) 5 MG tablet TAKE 1 TABLET BY MOUTH  DAILY   . Vitamin D, Cholecalciferol, 50 MCG (2000 UT) CAPS Take 2,000 Units by mouth daily.    No facility-administered encounter medications on file as of 07/25/2020.     Objective:  BP Readings from Last 3 Encounters:  07/25/20 134/70  06/27/20 (!) 195/89  06/07/20 129/75    Goals Addressed            This Visit's Progress   . RNCM: I have a lot of health problems but I am doing okay       Current Barriers:  . Chronic Disease Management support, education, and care coordination needs related to CHF, HTN, HLD, DMII, and CKD Stage 4  Clinical Goal(s) related to CHF, HTN, HLD, DMII, and CKD Stage 4 :  Over the next 120 days, patient will:  . Work with the care management team to address educational, disease management, and care coordination needs  . Begin or continue self health monitoring activities as directed today Measure and record cbg (blood glucose) bid times daily, Measure and record blood pressure 5 times per week, and Measure and record weight daily- currently does not have a scale . Call provider office for new  or worsened signs and symptoms Blood glucose findings outside established parameters, Blood pressure findings outside established parameters, Weight outside established parameters, Shortness of breath, and New or worsened symptom related to CKD 4 . Call care management team with questions or concerns . Verbalize basic understanding of patient centered plan of care established today  Interventions related to CHF, HTN, HLD, DMII, and CKD Stage 4 :  . Evaluation of current treatment plans and patient's adherence to plan as established by provider . Assessed patient understanding of disease states.  The patient verbalized understanding of disease  processes.  States compliance with medications.  . Assessed patient's education and care coordination needs . Provided disease specific education to patient. Education on following a heart healthy/ADA diet. The patient states she does not use salt.  Her hemoglobin A1C is 8.5 and indicates uncontrolled diabetes.  Education on the benefits of following a heart healthy/ADA diet.  Will send educational material to the patient concerning carb modified diet and also DASH diet. 07-25-2020: In office visit with the pcp. The patients hemoglobin A1C is down to 7.6%. Praised for making progress. Saw endocrinologist on 07-23-2020 and is working on getting the [name].  The patient has been trying to get this for a while but admits it is probably her fault because she didn't get the paperwork done.  Reviewed and gave the patient written information on DM management with a goal of fasting blood sugars being <130 and post prandial <180.  The patient states this am her blood sugar was 87; however some mornings it is >160.  The patient states when she checks it at night it is 130-200's.  Review of hemoglobin A1c of 7.0% or less.  The patient wants to do well with managing her DM and other chronic conditions.  Nash Dimmer with appropriate clinical care team members regarding patient needs.  The patient is currently working with the pharmacist with medications management.  The patient endorses compliance. States she is taking her insulin and other medications as directed. The MD has filled out paperwork for her to get the [facility] continuous glucose meter. 07-25-2020: the patient is moving forward with getting her [facility] and also has a number to call to get medication assistance for her insulins.  . Assessed the patients ability to weigh daily and the patient verbalized she does now have a scale, (07-25-2020) the patient is currently enrolled in the case management program with her insurance company and has a  scale and a blood pressure cuff. She is weighing daily and her weight is staying between 170-173.  Education provided. Review and explained the importance of daily weights due to her chronic condition of heart failure and the need to call the provider for >2 pound weight gain over night or >5 in one week. The patient verbalized understanding.  . Educated on the importance of elevating feet and legs when sitting and wearing ted hose or socks (patient did not have compression socks on at the time of the call). The patient verbalized she does have issues with swelling in her feet and legs.  The patient states that her swelling is not as bad when she wears sketchers shoes with the memory foam.  Discussed the importance of wearing ted hose to promote circulation. The patient verbalized understanding. 07-25-2020: The patient states that the swelling in her legs is better. The specialist has increased her Lasix. This is currently working well for her. Discussed the Sx/sx of dehydration and the importance of  continued daily weights.  . Evaluation of blood pressure checks.   Education on taking blood pressure on a regular basis and writing values down for RNCM, CCM team and pcp so a pattern could be determined.  Education on elevated blood pressure putting the patient at increased risk and stroke. 07-25-2020: the patient brought her home cuff into the office today. The reading was 163/81 with her home cuff compared to 157/63 with the office machine. The patient with some elevation of blood pressure and states this is what it has been running at home. Educational material provided as well as review of low sodium food options. Provided a log book for the patient to record her blood pressure readings daily.  . Education on the importance of following the recommendations of her providers due to the multiple chronic conditions she has. Expressed concern over her health and well being.   . Review of upcoming appointments. Pcp  appointment is 8/11 at 11 am. The patient has had her eye exam in June of 2021. Has had colonoscopy, and a referral for mammogram ordered today. The patient continues to see the specialist at the pain clinic for nerve pain related to past shingles.  . Review of CCM team help and provided the patient with contact information for the CCM team RNCM.     Patient Self Care Activities related to CHF, HTN, HLD, DMII, and CKD Stage 4 :  . Patient is unable to independently self-manage chronic health conditions   Please see past updates related to this goal by clicking on the "Past Updates" button in the selected goal          Plan:   Telephone follow up appointment with care management team member scheduled for: 08-17-2020 at 230 pm   [place], MSN, [facility] Mobile: [phone]

## 2020-07-25 NOTE — Assessment & Plan Note (Signed)
Chronic, ongoing.  Monitor closely due to AM low BS.  Continue collaboration with endocrinology. 

## 2020-07-25 NOTE — Assessment & Plan Note (Signed)
Chronic, ongoing with recent EF 60-65%.  Continue current heart medication regimen and collaboration with cardiology + CCM team in office.  She needs lots of education on HF, needs to have diet reiterated each visit. - Reminded to call for an overnight weight gain of >2 pounds or a weekly weight weight of >5 pounds -- work with CCM team on obtaining scale. - not adding salt to his food and has been reading food labels. Reviewed the importance of keeping daily sodium intake to 2000mg  daily  - Wear compression hose daily -- not on today

## 2020-07-25 NOTE — Assessment & Plan Note (Signed)
Chronic with CKD.  Continue iron supplement daily and collaboration with hematology and nephrology teams.

## 2020-07-25 NOTE — Assessment & Plan Note (Signed)
Chronic, ongoing, followed by nephrology with recent labs obtained.  Continue collaboration with nephrology.   Recent note reviewed.

## 2020-07-25 NOTE — Assessment & Plan Note (Signed)
Chronic, ongoing.  Initial BP elevated, but repeat at goal range -- occasional elevations at home.  Continue current medication regimen, recently had increase in Lasix, and collaboration with nephrology.  BMP up to date at end of June.  Recommend she continue to monitor BP at home daily and document for providers + focus on DASH diet.  TSH on labs today.  Return in 3 months.

## 2020-07-25 NOTE — Progress Notes (Signed)
BP 134/70   Pulse 81   Temp 98.7 F (37.1 C) (Oral)   Wt 173 lb 12.8 oz (78.8 kg)   LMP  (LMP Unknown)   SpO2 98%   BMI 27.22 kg/m    Subjective:    Patient ID: Tonya [name], female    DOB: 10-17-1946, 74 y.o.   MRN: [id number]  HPI: Tonya [name] is a 74 y.o. female  Chief Complaint  Patient presents with  . Diabetes  . Hyperlipidemia  . Hypertension   DIABETES Last saw Dr. [name] 07/23/20 with A1C 7.6%.  Ordered a [employer] and patient to call to obtain.  Cardiology and PCP have recommended to endo discontinuation of Actos due to her HF, she reports this medication continues.  Does endorse she has been eating differently and focused more on diet.  Previously has tried [place] and [employer]. Hypoglycemic episodes: occasional in morning Polydipsia/polyuria:no Visual disturbance:no Chest pain:no Paresthesias:no Glucose Monitoring:yes Accucheck frequency: TID  Fasting glucose:  this morning 87 -- occasional 160 Post prandial: Evening: 130 - 200 Before meals:              Taking Insulin?:yes Long acting insulin: Levemir 10 units in morning Short acting insulin: Humalog 6 units  Blood Pressure Monitoring:a few times a week Retinal Examination:Up to Date -- 06/01/20 Foot Exam:Up to Date Pneumovax:Up to Date Influenza:Up to Date Aspirin:yes  HYPERTENSION / HYPERLIPIDEMIA/HF Followed by cardiology, Dr. [name] Situ.  Continues on Carvedilol, Lasix, and Crestor. Last seen by cardiology on 01/05/20.  Her LVEF on 12/02/2019 was 60 to 65% with moderate LVH, and grade 1 diastolic dysfunction.  Discussed use of compression hose with her and she reports not using. Weighing self every day and denies increases. Satisfied with current treatment?yes Duration of hypertension:chronic BP monitoring frequency:a few times a week BP range:130-150/80-90's BP medication side  effects:no Duration of hyperlipidemia:chronic Cholesterol medication side effects:no Cholesterol supplements: none Medication compliance:good compliance Aspirin:yes Recent stressors:no Recurrent headaches:no Visual changes:no Palpitations:no Dyspnea:no Chest pain:no Lower extremity edema:no Dizzy/lightheaded:no  CHRONIC KIDNEY DISEASE Followed by nephrology and last seen 06/18/20, Lasix increased to 40 MG. Has underlying CKD with 03/28/20 labs showing CRT 2.2 and GFR 27, PTH 78, BUN 39 with nephrology.  Is followed by hematology for chronic disease anemia and last seen 12/27/2019.   Had BMP 06/04/20 -- CRT remains in 2 range. CKD status: stable Medications renally dose: yes Previous renal evaluation: yes Pneumovax:  Up to Date Influenza Vaccine:  Up to Date   POSTHERPETIC NEURALGIA: Followed by pain management and last seen by Dr. Holley Raring on 06/27/20 for injection.  Had shingles years ago and since that time has had ongoing issues with pain to lower right back. She is intolerant to Gabapentin and had issues with Pregabalin (change in mental status and hospital admission) + has tried Lidocaine patches with no benefit.  Has history of imaging to lumbar spine in 2016, where her pain is, and it showed "diffuse multilevel degenerative changes lumbar spine". Continues on Duloxetine.  Relevant past medical, surgical, family and social history reviewed and updated as indicated. Interim medical history since our last visit reviewed. Allergies and medications reviewed and updated.  Review of Systems  Constitutional: Negative for activity change, appetite change, diaphoresis, fatigue and fever.  Respiratory: Negative for cough, chest tightness and shortness of breath.   Cardiovascular: Negative for chest pain, palpitations and leg swelling.  Gastrointestinal: Negative for abdominal distention, abdominal pain, constipation, diarrhea, nausea and vomiting.  Endocrine: Negative for  cold  intolerance, heat intolerance, polydipsia, polyphagia and polyuria.  Musculoskeletal: Positive for back pain.  Neurological: Negative for dizziness, syncope, weakness, light-headedness, numbness and headaches.  Psychiatric/Behavioral: Negative.     Per HPI unless specifically indicated above     Objective:    BP 134/70   Pulse 81   Temp 98.7 F (37.1 C) (Oral)   Wt 173 lb 12.8 oz (78.8 kg)   LMP  (LMP Unknown)   SpO2 98%   BMI 27.22 kg/m   Wt Readings from Last 3 Encounters:  07/25/20 173 lb 12.8 oz (78.8 kg)  06/27/20 173 lb (78.5 kg)  06/07/20 177 lb (80.3 kg)    Physical Exam Vitals and nursing note reviewed.  Constitutional:      General: She is awake. She is not in acute distress.    Appearance: She is well-developed. She is not ill-appearing.  HENT:     Head: Normocephalic.     Right Ear: Hearing normal.     Left Ear: Hearing normal.  Eyes:     General: Lids are normal.        Right eye: No discharge.        Left eye: No discharge.     Conjunctiva/sclera: Conjunctivae normal.     Pupils: Pupils are equal, round, and reactive to light.  Neck:     Thyroid: No thyromegaly.     Vascular: No carotid bruit.  Cardiovascular:     Rate and Rhythm: Normal rate and regular rhythm.     Heart sounds: Normal heart sounds. No murmur. No gallop.   Pulmonary:     Effort: Pulmonary effort is normal. No accessory muscle usage or respiratory distress.     Breath sounds: Normal breath sounds.  Abdominal:     General: Bowel sounds are normal.     Palpations: Abdomen is soft.  Musculoskeletal:     Cervical back: Normal range of motion and neck supple.     Right lower leg: trace present.     Left lower leg: trace present.  Skin:    General: Skin is warm and dry.     Findings: No rash.  Neurological:     Mental Status: She is alert and oriented to person, place, and time.  Psychiatric:        Attention and Perception: Attention normal.        Mood and Affect: Mood  normal.        Behavior: Behavior normal. Behavior is cooperative.        Thought Content: Thought content normal.        Judgment: Judgment normal.   Diabetic Foot Exam - Simple   No data filed     Results for orders placed or performed in visit on 00/92/33  Basic metabolic panel  Result Value Ref Range   Glucose 170 (H) 65 - 99 mg/dL   BUN 50 (H) 8 - 27 mg/dL   Creatinine, Ser 2.36 (H) 0.57 - 1.00 mg/dL   GFR calc non Af Amer 20 (L) >59 mL/min/1.73   GFR calc Af Amer 23 (L) >59 mL/min/1.73   BUN/Creatinine Ratio 21 12 - 28   Sodium 146 (H) 134 - 144 mmol/L   Potassium 4.6 3.5 - 5.2 mmol/L   Chloride 113 (H) 96 - 106 mmol/L   CO2 19 (L) 20 - 29 mmol/L   Calcium 8.8 8.7 - 10.3 mg/dL      Assessment & Plan:   Problem List Items Addressed This Visit  Cardiovascular and Mediastinum   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  Initial BP elevated, but repeat at goal range -- occasional elevations at home.  Continue current medication regimen, recently had increase in Lasix, and collaboration with nephrology.  BMP up to date at end of June.  Recommend she continue to monitor BP at home daily and document for providers + focus on DASH diet.  TSH on labs today.  Return in 3 months.      Relevant Orders   TSH   Chronic heart failure with preserved ejection fraction (HFpEF) (HCC)    Chronic, ongoing with recent EF 60-65%.  Continue current heart medication regimen and collaboration with cardiology + CCM team in office.  She needs lots of education on HF, needs to have diet reiterated each visit. - Reminded to call for an overnight weight gain of >2 pounds or a weekly weight weight of >5 pounds -- work with CCM team on obtaining scale. - not adding salt to his food and has been reading food labels. Reviewed the importance of keeping daily sodium intake to 2000mg  daily  - Wear compression hose daily -- not on today         Endocrine   Poorly controlled type 2 diabetes mellitus with neuropathy ([place]) - Primary    Chronic, ongoing, followed by endocrinology.  Recent A1C 7.6% with endo. Continue current medication regimen and consider reduction if increased hypoglycemic episodes.  Cardiology and PCP have recommended to endo discontinuing Actos due to her HF.  Continue to collaborate with Dr. [name] and CCM team.  Recommend she monitor BS at least 3 times a day at home, working on [place] which will offer great benefit to her.  Return in 3 months.      Hyperlipidemia associated with type 2 diabetes mellitus (HCC)    Chronic, ongoing.  Continue Rosuvastatin. Continue to collaborate with [facility].  Lipid panel today.      Relevant Orders   Lipid Panel w/o Chol/HDL Ratio   Hyperparathyroidism due to renal insufficiency (HCC)    Chronic, ongoing, followed by nephrology with recent labs obtained.  Continue collaboration with nephrology.   Recent note reviewed.        Nervous and Auditory   Post herpetic neuralgia    Chronic with poor tolerance to Gabapentin and Lyrica and minimal relief with simple treatment methods.  Currently on Cymbalta and followed by pain management, continue this collaboration.        Other   Encounter for long-term (current) use of insulin (HCC)    Chronic, ongoing.  Monitor closely due to AM low BS.  Continue collaboration with endocrinology.      Anemia in CKD (chronic kidney disease)    Chronic with CKD.  Continue iron supplement daily and collaboration with hematology and nephrology teams.       Other Visit Diagnoses    Encounter for screening mammogram for malignant neoplasm of breast       Mammogram ordered   Relevant Orders   MM 3D SCREEN BREAST BILATERAL      Follow up plan: Return in about 3 months (around 10/25/2020) for T2DM, HTN/HLD, CKD.

## 2020-07-25 NOTE — Assessment & Plan Note (Signed)
Chronic, ongoing.  Continue Rosuvastatin. Continue to collaborate with [facility].   Lipid panel today. 

## 2020-07-25 NOTE — Assessment & Plan Note (Signed)
Chronic with poor tolerance to Gabapentin and Lyrica and minimal relief with simple treatment methods.  Currently on Cymbalta and followed by pain management, continue this collaboration.

## 2020-07-26 ENCOUNTER — Ambulatory Visit
Payer: Medicare Other | Attending: Student in an Organized Health Care Education/Training Program | Admitting: Student in an Organized Health Care Education/Training Program

## 2020-07-26 LAB — LIPID PANEL W/O CHOL/HDL RATIO
Cholesterol, Total: 186 mg/dL (ref 100–199)
HDL: 66 mg/dL (ref >39)
LDL Chol Calc (NIH): 103 mg/dL — ABNORMAL HIGH (ref 0–99)
Triglycerides: 94 mg/dL (ref 0–149)
VLDL Cholesterol Cal: 17 mg/dL (ref 5–40)

## 2020-07-26 LAB — TSH: TSH: 2.33 u[IU]/mL (ref 0.450–4.500)

## 2020-07-26 NOTE — Progress Notes (Signed)
Contacted via [place] morning [place], your labs have returned.  Cholesterol levels, LDL, remain above goal -- would like to see LDL < 70 and your number is 103.  Are you taking Rosuvastatin daily?  Would you feel comfortable trying to go up on dose? Thyroid testing is normal.  Any questions?  Have a great day!! Keep being awesome!! Kindest regards, Safal Halderman

## 2020-07-30 ENCOUNTER — Ambulatory Visit (INDEPENDENT_AMBULATORY_CARE_PROVIDER_SITE_OTHER): Payer: Medicare Other

## 2020-07-30 VITALS — Ht 67.0 in | Wt 170.0 lb

## 2020-07-30 DIAGNOSIS — Z0000 Encounter for general adult medical examination without abnormal findings: Secondary | ICD-10-CM

## 2020-07-30 NOTE — Progress Notes (Signed)
I connected with Rudene Re today by telephone and verified that I am speaking with the correct person using two identifiers. Location patient: home Location provider: work Persons participating in the virtual visit: Leani Myron, Glenna Durand LPN.   I discussed the limitations, risks, security and privacy concerns of performing an evaluation and management service by telephone and the availability of in person appointments. I also discussed with the patient that there may be a patient responsible charge related to this service. The patient expressed understanding and verbally consented to this telephonic visit.    Interactive audio and video telecommunications were attempted between this provider and patient, however failed, due to patient having technical difficulties OR patient did not have access to video capability.  We continued and completed visit with audio only.    Vital signs may be patient reported or missing.   Subjective:   Tonya [name] is a 74 y.o. female who presents for Medicare Annual (Subsequent) preventive examination.  Review of Systems     Cardiac Risk Factors include: advanced age (>29mn, >>54women);diabetes mellitus;hypertension;sedentary lifestyle     Objective:    Today's Vitals   07/30/20 1426 07/30/20 1427  Weight: 170 lb (77.1 kg)   Height: 5' 7" (1.702 m)   PainSc:  2    Body mass index is 26.63 kg/m.  Advanced Directives 07/30/2020 04/18/2020 01/16/2020 12/26/2019 12/01/2019 12/01/2019 09/26/2019  Does Patient Have a Medical Advance Directive? _0  No No  Would patient like information on creating a medical advance directive? - - No - Patient declined No - Patient declined No - Patient declined - -    Current Medications (verified) Outpatient Encounter Medications as of 07/30/2020  Medication Sig  . aspirin 81 MG tablet Take 81 mg by mouth daily.  . carvedilol (COREG) 3.125 MG tablet TAKE 1 TABLET BY MOUTH  TWICE DAILY WITH A MEAL    . clopidogrel (PLAVIX) 75 MG tablet TAKE 1 TABLET BY MOUTH  DAILY  . ferrous sulfate 325 (65 FE) MG EC tablet Take 1 tablet (325 mg total) by mouth 3 (three) times daily with meals.  . furosemide (LASIX) 40 MG tablet Take 40 mg by mouth daily.  .Marland KitchenHUMALOG KWIKPEN 100 UNIT/ML KiwkPen TK 12 UNITS BEFORE LUNCH AND 8 UNITS BEFORE SUPPER.  .Marland Kitcheninsulin detemir (LEVEMIR) 100 UNIT/ML injection Inject 10 Units into the skin every morning.   .Marland KitchenNOVOFINE 32G X 6 MM MISC USE AS DIRECTED THREE TIMES DAILY WITH HUMALOG  . olmesartan (BENICAR) 40 MG tablet Take 40 mg by mouth daily.  .Marland Kitchenomeprazole (PRILOSEC) 20 MG capsule TAKE 1 CAPSULE BY MOUTH  TWICE DAILY  . pioglitazone (ACTOS) 30 MG tablet Take 30 mg by mouth daily.  . rosuvastatin (CRESTOR) 5 MG tablet TAKE 1 TABLET BY MOUTH  DAILY  . Vitamin D, Cholecalciferol, 50 MCG (2000 UT) CAPS Take 2,000 Units by mouth daily.  . DULoxetine (CYMBALTA) 20 MG capsule Take 1 capsule (20 mg total) by mouth daily.   No facility-administered encounter medications on file as of 07/30/2020.    Allergies (verified) Atorvastatin, Gabapentin, Pravastatin, Pregabalin, and Trulicity [dulaglutide]   History: Past Medical History:  Diagnosis Date  . (HFpEF) heart failure with preserved ejection fraction ([facility]    a. 05/2017 Echo: EF 55-60%, Gr1 DD, mildly dil LA.  . CKD (chronic kidney disease), stage III   . Diabetes mellitus without complication ([facility]   . Hyperlipidemia   . Hypertension   . Stroke ([facility]  a. 08/2017.   Past Surgical History:  Procedure Laterality Date  . COLONOSCOPY  12/15/2006  . COLONOSCOPY WITH PROPOFOL N/A 06/06/2019   Procedure: COLONOSCOPY WITH PROPOFOL;  Surgeon: [name], MD;  Location[facility] ENDOSCOPY;  Service: Gastroenterology;  Laterality: N/A;  . COLONOSCOPY WITH PROPOFOL N/A 01/16/2020   Procedure: COLONOSCOPY WITH BIOPSY;  Surgeon: [name], MD;  Location: [place];  Service: Endoscopy;  Laterality: N/A;  Diabetic - insulin  and oral meds  . POLYPECTOMY N/A 01/16/2020   Procedure: POLYPECTOMY;  Surgeon: [name], MD;  Location: [place];  Service: Endoscopy;  Laterality: N/A;  . TUBAL LIGATION     Family History  Problem Relation Age of Onset  . Diabetes Mother   . Stroke Mother   . Hyperlipidemia Father   . Hypertension Father   . Diabetes Sister   . Diabetes Brother   . Gout Son   . Diabetes Brother   . Kidney disease Son   . Lymphoma Sister   . Heart attack Sister   . Breast cancer Neg Hx    Social History   Socioeconomic History  . Marital status: Married    Spouse name: Not on file  . Number of children: Not on file  . Years of education: Not on file  . Highest education level: GED or equivalent  Occupational History  . Occupation: retired  Tobacco Use  . Smoking status: Never Smoker  . Smokeless tobacco: Never Used  Vaping Use  . Vaping Use: Never used  Substance and Sexual Activity  . Alcohol use: No    Alcohol/week: 0.0 standard drinks  . Drug use: No  . Sexual activity: Yes  Other Topics Concern  . Not on file  Social History Narrative  . Not on file   Social Determinants of Health   Financial Resource Strain: Low Risk   . Difficulty of Paying Living Expenses: Not hard at all  Food Insecurity: No Food Insecurity  . Worried About Charity fundraiser in the Last Year: Never true  . Ran Out of Food in the Last Year: Never true  Transportation Needs: No Transportation Needs  . Lack of Transportation (Medical): No  . Lack of Transportation (Non-Medical): No  Physical Activity: Inactive  . Days of Exercise per Week: 0 days  . Minutes of Exercise per Session: 0 min  Stress: No Stress Concern Present  . Feeling of Stress : Not at all  Social Connections: Socially Integrated  . Frequency of Communication with Friends and Family: More than three times a week  . Frequency of Social Gatherings with Friends and Family: More than three times a week  . Attends  Religious Services: More than 4 times per year  . Active Member of Clubs or Organizations: Yes  . Attends Archivist Meetings: More than 4 times per year  . Marital Status: Married    Tobacco Counseling Counseling given: Not Answered   Clinical Intake:  Pre-visit preparation completed: Yes  Pain : 0-10 Pain Score: 2  Pain Type: Chronic pain Pain Location: Back Pain Orientation: Right Pain Descriptors / Indicators: Burning Pain Onset: More than a month ago Pain Frequency: Constant     Nutritional Status: BMI 25 -29 Overweight Nutritional Risks: Nausea/ vomitting/ diarrhea (diarrhea this weekend, resolved) Diabetes: Yes  How often do you need to have someone help you when you read instructions, pamphlets, or other written materials from your doctor or pharmacy?: 1 - Never What is the last  grade level you completed in school?: GED  Diabetic? Yes Nutrition Risk Assessment:  Has the patient had any N/V/D within the last 2 months?  Yes  Does the patient have any non-healing wounds?  No  Has the patient had any unintentional weight loss or weight gain?  No   Diabetes:  Is the patient diabetic?  Yes  If diabetic, was a CBG obtained today?  No  Did the patient bring in their glucometer from home?  No  How often do you monitor your CBG's? daily.   Financial Strains and Diabetes Management:  Are you having any financial strains with the device, your supplies or your medication? No .  Does the patient want to be seen by Chronic Care Management for management of their diabetes?  No  Would the patient like to be referred to a Nutritionist or for Diabetic Management?  No   Diabetic Exams:  Diabetic Eye Exam: Completed 10/03/2019 Diabetic Foot Exam: Completed 04/10/2020   Interpreter Needed?: No  Information entered by :: NAllen LPN   Activities of Daily Living In your present state of health, do you have any difficulty performing the following activities:  07/30/2020 01/16/2020  Hearing? N N  Vision? Y N  Comment vision off working with eye doctor -  Difficulty concentrating or making decisions? N N  Walking or climbing stairs? Y N  Comment just needs the rails -  Dressing or bathing? N N  Doing errands, shopping? N -  Preparing Food and eating ? N -  Using the Toilet? N -  In the past six months, have you accidently leaked urine? Y -  Comment on occassion -  Do you have problems with loss of bowel control? N -  Managing your Medications? N -  Managing your Finances? N -  Housekeeping or managing your Housekeeping? N -  Some recent data might be hidden    Patient Care Team: [name], NP as PCP - General (Nurse Practitioner) [name], MD as PCP - Cardiology (Cardiology) [name] Betsey Holiday, MD as Physician Assistant (Endocrinology) Lavonia Dana, MD as Consulting Physician (Nephrology) [name], MD as Consulting Physician (Cardiology) Vanita Ingles, RN as Case Manager (General Practice)  Indicate any recent Medical Services you may have received from other than [facility] providers in the past year (date may be approximate).     Assessment:   This is a routine wellness examination for [place].  Hearing/Vision screen  Hearing Screening   125Hz 250Hz 500Hz 1000Hz 2000Hz 3000Hz 4000Hz 6000Hz 8000Hz  Right ear:           Left ear:           Vision Screening Comments: Regular [facility] exams, [facility]  Dietary issues and exercise activities discussed: Current Exercise Habits: The patient does not participate in regular exercise at present  Goals    .  DIET - INCREASE WATER INTAKE      Recommend drinking at least 6-8 glasses of water a day     .  Increase water intake      Recommend drinking at least 4-5 glasses of water a day    .  Patient Stated      07/30/2020, wants to heal nerve pain    .  PharmD "I need to stay healthy" (pt-stated)      CARE PLAN ENTRY (see longtitudinal plan of care for  additional care plan information)  Current Barriers:  . Diabetes: uncontrolled; complicated by  chronic medical conditions including HTN, CKD, most recent A1c 8.5% o Patient notes that she is going to take the [employer] paperwork back to Dr. Joycie Peek office when she goes to see her next. She is unsure status of FreeStyle Libre CGM order . Medications managed by her daughter, [name], who fills a weekly pill box . Most recent eGFR: ~25 mL/min per Care Everywhere . Current antihyperglycemic regimen: Levemir 10 units daily - though has only been taking 8 units QAM; Humalog 6-8 units before meals (though often skipping if BG "low" and taking after her meal to correct), pioglitazone 30 mg daily (cardiology recommended consideration of d/c, endocrinology continued) . Current glucose readings:  o Fasting 110-120s o Before supper: 200-230s . Current meal patterns: o Eats breakfast ~10 am o Snack in mid afternoon: banana, canned peaches, pack of nabs o Supper ~ 6 pm . Cardiovascular risk reduction: o Current hypertensive regimen: carvedilol 3.125 mg BID, olmesartan 40 mg o Current hyperlipidemia regimen: rosuvastatin 5 mg daily; last LDL 100, consider more stringent goal of <70 given hx CVA o Current antiplatelet regimen: ASA 81 mg daily, clopidogrel 75 mg daily (hx acute CVA in 2018) . 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 Comprehensive medication review performed, medication list updated in electronic medical record . Inter-disciplinary care team collaboration (see longitudinal plan of care) . Reviewed insulin dosing principals. Encouraged to increase Levemir to 10 units every morning, and take Humalog 6 units before  each meal (usually 2 meals) if pre-meal sugar >100. Reiterated these instructions multiple times and employed teach back method. Will mail instructions to patient as well  . Extensive dietary discussion. Discussed appropriate serving sizes of her favorite snacks, like nabs and fruit. Encouraged to rinse syrup off of pre-packed fruits. Reviewed that since her pre-supper readings are the highest point of her day, cutting back on afternoon snacking should help this. She verbalized agreement.  . Encouraged her to discuss FreeStyle Libre CGM with daughter, [name], and call Dr. Joycie Peek office for f/u support w/ DME company.   Patient Self Care Activities:  . Patient will check blood glucose BID-TID, document, and provide at future appointments . Patient will take medications as prescribed . Patient will report any questions or concerns to provider   Please see past updates related to this goal by clicking on the "Past Updates" button in the selected goal      .  RNCM: I have a lot of health problems but I am doing okay      Current Barriers:  . Chronic Disease Management support, education, and care coordination needs related to CHF, HTN, HLD, DMII, and CKD Stage 4  Clinical Goal(s) related to CHF, HTN, HLD, DMII, and CKD Stage 4 :  Over the next 120 days, patient will:  . Work with the care management team to address educational, disease management, and care coordination needs  . Begin or continue self health monitoring activities as directed today Measure and record cbg (blood glucose) bid times daily, Measure and record blood pressure 5 times per week, and Measure and record weight daily- currently does not have a scale . Call provider office for new or worsened signs and symptoms Blood glucose findings outside established parameters, Blood pressure findings outside established  parameters, Weight outside established parameters, Shortness of breath, and New or worsened symptom related to CKD  4 . Call care management team with questions or concerns . Verbalize basic understanding of patient centered plan of care established today  Interventions related to CHF, HTN, HLD, DMII, and CKD Stage 4 :  . Evaluation of current treatment plans and patient's adherence to plan as established by provider . Assessed patient understanding of disease states.  The patient verbalized understanding of disease processes.  States compliance with medications.  . Assessed patient's education and care coordination needs . Provided disease specific education to patient. Education on following a heart healthy/ADA diet. The patient states she does not use salt.  Her hemoglobin A1C is 8.5 and indicates uncontrolled diabetes.  Education on the benefits of following a heart healthy/ADA diet.  Will send educational material to the patient concerning carb modified diet and also DASH diet. 07-25-2020: In office visit with the pcp. The patients hemoglobin A1C is down to 7.6%. Praised for making progress. Saw endocrinologist on 07-23-2020 and is working on getting the [name].  The patient has been trying to get this for a while but admits it is probably her fault because she didn't get the paperwork done.  Reviewed and gave the patient written information on DM management with a goal of fasting blood sugars being <130 and post prandial <180.  The patient states this am her blood sugar was 87; however some mornings it is >160.  The patient states when she checks it at night it is 130-200's.  Review of hemoglobin A1c of 7.0% or less.  The patient wants to do well with managing her DM and other chronic conditions.  Nash Dimmer with appropriate clinical care team members regarding patient needs.  The patient is currently working with the pharmacist with medications management.  The patient endorses compliance. States she is taking her insulin and other medications as directed. The MD has filled out paperwork for her to get  the [facility] continuous glucose meter. 07-25-2020: the patient is moving forward with getting her [facility] and also has a number to call to get medication assistance for her insulins.  . Assessed the patients ability to weigh daily and the patient verbalized she does now have a scale, (07-25-2020) the patient is currently enrolled in the case management program with her insurance company and has a scale and a blood pressure cuff. She is weighing daily and her weight is staying between 170-173.  Education provided. Review and explained the importance of daily weights due to her chronic condition of heart failure and the need to call the provider for >2 pound weight gain over night or >5 in one week. The patient verbalized understanding.  . Educated on the importance of elevating feet and legs when sitting and wearing ted hose or socks (patient did not have compression socks on at the time of the call). The patient verbalized she does have issues with swelling in her feet and legs.  The patient states that her swelling is not as bad when she wears sketchers shoes with the memory foam.  Discussed the importance of wearing ted hose to promote circulation. The patient verbalized understanding. 07-25-2020: The patient states that the swelling in her legs is better. The specialist has increased her Lasix. This is currently working well for her. Discussed the Sx/sx of dehydration and the importance of continued daily weights.  . Evaluation of blood pressure checks.   Education on taking blood  pressure on a regular basis and writing values down for RNCM, CCM team and pcp so a pattern could be determined.  Education on elevated blood pressure putting the patient at increased risk and stroke. 07-25-2020: the patient brought her home cuff into the office today. The reading was 163/81 with her home cuff compared to 157/63 with the office machine. The patient with some elevation of blood pressure and states this is  what it has been running at home. Educational material provided as well as review of low sodium food options. Provided a log book for the patient to record her blood pressure readings daily.  . Education on the importance of following the recommendations of her providers due to the multiple chronic conditions she has. Expressed concern over her health and well being.   . Review of upcoming appointments. Pcp appointment is 8/11 at 11 am. The patient has had her eye exam in June of 2021. Has had colonoscopy, and a referral for mammogram ordered today. The patient continues to see the specialist at the pain clinic for nerve pain related to past shingles.  . Review of CCM team help and provided the patient with contact information for the CCM team RNCM.     Patient Self Care Activities related to CHF, HTN, HLD, DMII, and CKD Stage 4 :  . Patient is unable to independently self-manage chronic health conditions   Please see past updates related to this goal by clicking on the "Past Updates" button in the selected goal        Depression Screen PHQ 2/9 Scores 07/30/2020 04/20/2020 03/07/2020 07/06/2019 02/15/2019 06/30/2018 06/24/2017  PHQ - 2 Score 0 0 0 0 0 0 0  PHQ- 9 Score 3 - - - - - -    Fall Risk Fall Risk  07/30/2020 06/27/2020 06/07/2020 04/18/2020 03/07/2020  Falls in the past year? 0 0 0 0 0  Number falls in past yr: - 0 - - 0  Injury with Fall? - 0 - - -  Risk for fall due to : Medication side effect - - - -  Follow up Falls evaluation completed;Education provided;Falls prevention discussed - - - -    Any stairs in or around the home? Yes  If so, are there any without handrails? No  Home free of loose throw rugs in walkways, pet beds, electrical cords, etc? Yes  Adequate lighting in your home to reduce risk of falls? Yes   ASSISTIVE DEVICES UTILIZED TO PREVENT FALLS:  Life alert? No  Use of a cane, walker or w/c? Yes  Grab bars in the bathroom? Yes  Shower chair or bench in shower? Yes   Elevated toilet seat or a handicapped toilet? No   TIMED UP AND GO:  Was the test performed? No .   Cognitive Function:     6CIT Screen 07/30/2020 07/06/2019 06/30/2018 06/24/2017  What Year? 0 points 0 points 0 points 0 points  What month? 0 points 0 points 0 points 0 points  What time? 0 points 0 points 0 points 0 points  Count back from 20 0 points 0 points 0 points 0 points  Months in reverse 4 points 0 points 0 points 0 points  Repeat phrase 0 points 0 points 2 points 0 points  Total Score 4 0 2 0    Immunizations Immunization History  Administered Date(s) Administered  . Fluad Quad(high Dose 65+) 09/30/2019  . Influenza, High Dose Seasonal PF 09/10/2016, 11/16/2018  . Influenza,inj,Quad PF,6+ Mos 09/18/2015  .  Influenza-Unspecified 09/18/2015, 09/08/2017  . Moderna SARS-COVID-2 Vaccination 02/15/2020, 03/07/2020  . PPD Test 07/15/2016, 07/29/2016  . Pneumococcal Conjugate-13 06/07/2014  . Pneumococcal Polysaccharide-23 09/07/2013  . Td 06/12/2005  . Tdap 10/10/2016  . Zoster 09/13/2013    TDAP status: Up to date Flu Vaccine status: Up to date Pneumococcal vaccine status: Up to date Covid-19 vaccine status: Completed vaccines  Qualifies for Shingles Vaccine? Yes   Zostavax completed Yes   Shingrix Completed?: No.    Education has been provided regarding the importance of this vaccine. Patient has been advised to call insurance company to determine out of pocket expense if they have not yet received this vaccine. Advised may also receive vaccine at local pharmacy or Health Dept. Verbalized acceptance and understanding.  Screening Tests Health Maintenance  Topic Date Due  . MAMMOGRAM  03/12/2019  . INFLUENZA VACCINE  07/15/2020  . HEMOGLOBIN A1C  09/27/2020  . FOOT EXAM  04/10/2021  . OPHTHALMOLOGY EXAM  06/01/2021  . TETANUS/TDAP  10/10/2026  . COLONOSCOPY  01/15/2030  . DEXA SCAN  Completed  . COVID-19 Vaccine  Completed  . Hepatitis C Screening  Completed   . PNA vac Low Risk Adult  Completed    Health Maintenance  Health Maintenance Due  Topic Date Due  . MAMMOGRAM  03/12/2019  . INFLUENZA VACCINE  07/15/2020    Colorectal cancer screening: Completed 01/16/2020. Repeat every 10 years Mammogram status: appointment set Bone Density status: Completed 03/11/2017.   Lung Cancer Screening: (Low Dose CT Chest recommended if Age 98-80 years, 30 pack-year currently smoking OR have quit w/in 15years.) does not qualify.   Lung Cancer Screening Referral: no  Additional Screening:  Hepatitis C Screening: does qualify; Completed 09/10/2016  Vision Screening: Recommended annual ophthalmology exams for early detection of glaucoma and other disorders of the eye. Is the patient up to date with their annual eye exam?  Yes  Who is the provider or what is the name of the office in which the patient attends annual eye exams? [facility] If pt is not established with a provider, would they like to be referred to a provider to establish care? No .   Dental Screening: Recommended annual dental exams for proper oral hygiene  Community Resource Referral / Chronic Care Management: CRR required this visit?  No   CCM required this visit?  No      Plan:     I have personally reviewed and noted the following in the patient's chart:   . Medical and social history . Use of alcohol, tobacco or illicit drugs  . Current medications and supplements . Functional ability and status . Nutritional status . Physical activity . Advanced directives . List of other physicians . Hospitalizations, surgeries, and ER visits in previous 12 months . Vitals . Screenings to include cognitive, depression, and falls . Referrals and appointments  In addition, I have reviewed and discussed with patient certain preventive protocols, quality metrics, and best practice recommendations. A written personalized care plan for preventive services as well as general  preventive health recommendations were provided to patient.     Kellie Simmering, LPN   04/09/8340   Nurse Notes:

## 2020-07-30 NOTE — Patient Instructions (Signed)
Tonya [name] , Thank you for taking time to come for your Medicare Wellness Visit. I appreciate your ongoing commitment to your health goals. Please review the following plan we discussed and let me know if I can assist you in the future.   Screening recommendations/referrals: Colonoscopy: completed 01/16/2020 Mammogram: appointment scheduled Bone Density: completed 03/11/2017 Recommended yearly ophthalmology/optometry visit for glaucoma screening and checkup Recommended yearly dental visit for hygiene and checkup  Vaccinations: Influenza vaccine: due Pneumococcal vaccine: completed 06/07/2014 Tdap vaccine: completed 10/10/2016 Shingles vaccine: discussed   Covid-19:completed 03/07/2020, 02/15/2020  Advanced directives: Advance directive discussed with you today.   Conditions/risks identified: none  Next appointment: Follow up in one year for your annual wellness visit    Preventive Care 65 Years and Older, Female Preventive care refers to lifestyle choices and visits with your health care provider that can promote health and wellness. What does preventive care include?  A yearly physical exam. This is also called an annual well check.  Dental exams once or twice a year.  Routine eye exams. Ask your health care provider how often you should have your eyes checked.  Personal lifestyle choices, including:  Daily care of your teeth and gums.  Regular physical activity.  Eating a healthy diet.  Avoiding tobacco and drug use.  Limiting alcohol use.  Practicing safe sex.  Taking low-dose aspirin every day.  Taking vitamin and mineral supplements as recommended by your health care provider. What happens during an annual well check? The services and screenings done by your health care provider during your annual well check will depend on your age, overall health, lifestyle risk factors, and family history of disease. Counseling  Your health care provider may ask you questions  about your:  Alcohol use.  Tobacco use.  Drug use.  Emotional well-being.  Home and relationship well-being.  Sexual activity.  Eating habits.  History of falls.  Memory and ability to understand (cognition).  Work and work Statistician.  Reproductive health. Screening  You may have the following tests or measurements:  Height, weight, and BMI.  Blood pressure.  Lipid and cholesterol levels. These may be checked every 5 years, or more frequently if you are over 28 years old.  Skin check.  Lung cancer screening. You may have this screening every year starting at age 16 if you have a 30-pack-year history of smoking and currently smoke or have quit within the past 15 years.  Fecal occult blood test (FOBT) of the stool. You may have this test every year starting at age 3.  Flexible sigmoidoscopy or colonoscopy. You may have a sigmoidoscopy every 5 years or a colonoscopy every 10 years starting at age 51.  Hepatitis C blood test.  Hepatitis B blood test.  Sexually transmitted disease (STD) testing.  Diabetes screening. This is done by checking your blood sugar (glucose) after you have not eaten for a while (fasting). You may have this done every 1-3 years.  Bone density scan. This is done to screen for osteoporosis. You may have this done starting at age 57.  Mammogram. This may be done every 1-2 years. Talk to your health care provider about how often you should have regular mammograms. Talk with your health care provider about your test results, treatment options, and if necessary, the need for more tests. Vaccines  Your health care provider may recommend certain vaccines, such as:  Influenza vaccine. This is recommended every year.  Tetanus, diphtheria, and acellular pertussis (Tdap, Td) vaccine. You may  need a Td booster every 10 years.  Zoster vaccine. You may need this after age 58.  Pneumococcal 13-valent conjugate (PCV13) vaccine. One dose is  recommended after age 49.  Pneumococcal polysaccharide (PPSV23) vaccine. One dose is recommended after age 19. Talk to your health care provider about which screenings and vaccines you need and how often you need them. This information is not intended to replace advice given to you by your health care provider. Make sure you discuss any questions you have with your health care provider. Document Released: 12/28/2015 Document Revised: 08/20/2016 Document Reviewed: 10/02/2015 Elsevier Interactive Patient Education  2017 [place] Prevention in the Home Falls can cause injuries. They can happen to people of all ages. There are many things you can do to make your home safe and to help prevent falls. What can I do on the outside of my home?  Regularly fix the edges of walkways and driveways and fix any cracks.  Remove anything that might make you trip as you walk through a door, such as a raised step or threshold.  Trim any bushes or trees on the path to your home.  Use bright outdoor lighting.  Clear any walking paths of anything that might make someone trip, such as rocks or tools.  Regularly check to see if handrails are loose or broken. Make sure that both sides of any steps have handrails.  Any raised decks and porches should have guardrails on the edges.  Have any leaves, snow, or ice cleared regularly.  Use sand or salt on walking paths during winter.  Clean up any spills in your garage right away. This includes oil or grease spills. What can I do in the bathroom?  Use night lights.  Install grab bars by the toilet and in the tub and shower. Do not use towel bars as grab bars.  Use non-skid mats or decals in the tub or shower.  If you need to sit down in the shower, use a plastic, non-slip stool.  Keep the floor dry. Clean up any water that spills on the floor as soon as it happens.  Remove soap buildup in the tub or shower regularly.  Attach bath mats  securely with double-sided non-slip rug tape.  Do not have throw rugs and other things on the floor that can make you trip. What can I do in the bedroom?  Use night lights.  Make sure that you have a light by your bed that is easy to reach.  Do not use any sheets or blankets that are too big for your bed. They should not hang down onto the floor.  Have a firm chair that has side arms. You can use this for support while you get dressed.  Do not have throw rugs and other things on the floor that can make you trip. What can I do in the kitchen?  Clean up any spills right away.  Avoid walking on wet floors.  Keep items that you use a lot in easy-to-reach places.  If you need to reach something above you, use a strong step stool that has a grab bar.  Keep electrical cords out of the way.  Do not use floor polish or wax that makes floors slippery. If you must use wax, use non-skid floor wax.  Do not have throw rugs and other things on the floor that can make you trip. What can I do with my stairs?  Do not leave any items on  the stairs.  Make sure that there are handrails on both sides of the stairs and use them. Fix handrails that are broken or loose. Make sure that handrails are as long as the stairways.  Check any carpeting to make sure that it is firmly attached to the stairs. Fix any carpet that is loose or worn.  Avoid having throw rugs at the top or bottom of the stairs. If you do have throw rugs, attach them to the floor with carpet tape.  Make sure that you have a light switch at the top of the stairs and the bottom of the stairs. If you do not have them, ask someone to add them for you. What else can I do to help prevent falls?  Wear shoes that:  Do not have high heels.  Have rubber bottoms.  Are comfortable and fit you well.  Are closed at the toe. Do not wear sandals.  If you use a stepladder:  Make sure that it is fully opened. Do not climb a closed  stepladder.  Make sure that both sides of the stepladder are locked into place.  Ask someone to hold it for you, if possible.  Clearly mark and make sure that you can see:  Any grab bars or handrails.  First and last steps.  Where the edge of each step is.  Use tools that help you move around (mobility aids) if they are needed. These include:  Canes.  Walkers.  Scooters.  Crutches.  Turn on the lights when you go into a dark area. Replace any light bulbs as soon as they burn out.  Set up your furniture so you have a clear path. Avoid moving your furniture around.  If any of your floors are uneven, fix them.  If there are any pets around you, be aware of where they are.  Review your medicines with your doctor. Some medicines can make you feel dizzy. This can increase your chance of falling. Ask your doctor what other things that you can do to help prevent falls. This information is not intended to replace advice given to you by your health care provider. Make sure you discuss any questions you have with your health care provider. Document Released: 09/27/2009 Document Revised: 05/08/2016 Document Reviewed: 01/05/2015 Elsevier Interactive Patient Education  2017 [employer].

## 2020-08-07 ENCOUNTER — Other Ambulatory Visit: Payer: Self-pay | Admitting: Nurse Practitioner

## 2020-08-07 MED ORDER — OLMESARTAN MEDOXOMIL 40 MG PO TABS: 40.0000 mg | ORAL_TABLET | Freq: Every day | ORAL | 4 refills | Status: DC

## 2020-08-07 NOTE — Telephone Encounter (Signed)
Patient last seen 07/25/20 and has appointment 10/25/20

## 2020-08-07 NOTE — Telephone Encounter (Signed)
RX REFILL olmesartan (BENICAR) 40 MG tablet  PHARMACY  [id number] - [place] [address], [place] AT [address] Phone:  [phone]  Fax:  [phone]     Patient daughter called stating that patient needs medication asap.  Patient has been out for a week now.   Call back [phone]

## 2020-08-07 NOTE — Telephone Encounter (Signed)
Requested medication (s) are due for refill today:  Yes  Requested medication (s) are on the active medication list:  Yes  Future visit scheduled:  Yes  Last Refill: Historical Provider  Notes to clinic: daughter reported pt. Has been out of this medication x 1 week  Requested Prescriptions  Pending Prescriptions Disp Refills   olmesartan (BENICAR) 40 MG tablet      Sig: Take 1 tablet (40 mg total) by mouth daily.      Cardiovascular:  Angiotensin Receptor Blockers Failed - 08/07/2020  2:27 PM      Failed - Cr in normal range and within 180 days    Creatinine, Ser  Date Value Ref Range Status  06/04/2020 2.36 (H) 0.57 - 1.00 mg/dL Final          Passed - K in normal range and within 180 days    Potassium  Date Value Ref Range Status  06/04/2020 4.6 3.5 - 5.2 mmol/L Final          Passed - Patient is not pregnant      Passed - Last BP in normal range    BP Readings from Last 1 Encounters:  07/25/20 134/70          Passed - Valid encounter within last 6 months    Recent Outpatient Visits           1 week ago Poorly controlled type 2 diabetes mellitus with neuropathy ([place])   [place], [name] T, NP   2 months ago 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

## 2020-08-09 DIAGNOSIS — E1165 Type 2 diabetes mellitus with hyperglycemia: Secondary | ICD-10-CM | POA: Diagnosis not present

## 2020-08-09 DIAGNOSIS — Z794 Long term (current) use of insulin: Secondary | ICD-10-CM | POA: Diagnosis not present

## 2020-08-17 ENCOUNTER — Telehealth: Payer: Self-pay

## 2020-08-17 ENCOUNTER — Telehealth: Payer: Self-pay | Admitting: General Practice

## 2020-08-17 NOTE — Telephone Encounter (Signed)
  Chronic Care Management   Outreach Note  08/17/2020 Name: Tonya [name] MRN: [id number] DOB: 03/01/1946  Referred by: [name], NP Reason for referral : Chronic Care Management (RNCM Follow up call for Chronic Disease Managment and Care Coordination Needs)   An unsuccessful telephone outreach was attempted today. The patient was referred to the case management team for assistance with care management and care coordination.   Follow Up Plan: The care management team will reach out to the patient again over the next 30 to 60 days.   [name] RN, MSN, [facility] Mobile: [phone]

## 2020-08-18 ENCOUNTER — Other Ambulatory Visit: Payer: Self-pay | Admitting: Nurse Practitioner

## 2020-08-18 NOTE — Telephone Encounter (Signed)
Requested  medications are  due for refill today yes  Requested medications are on the active medication list yes  Last refill 8/2  Last visit I do not see either of these meds,Plavix or Prilosec, in an office note.  Future visit scheduled 09/10/20  Notes to clinic Did not see these meds/dx in office note, does have an upcoming visit this month, please assess.

## 2020-08-21 ENCOUNTER — Telehealth: Payer: Self-pay

## 2020-08-21 NOTE — Telephone Encounter (Signed)
Patient last seen 07/25/20

## 2020-08-29 ENCOUNTER — Other Ambulatory Visit: Payer: Self-pay | Admitting: Nurse Practitioner

## 2020-08-30 NOTE — Telephone Encounter (Signed)
Pt has been r/s  

## 2020-09-06 DIAGNOSIS — G8929 Other chronic pain: Secondary | ICD-10-CM | POA: Diagnosis not present

## 2020-09-06 DIAGNOSIS — M545 Low back pain: Secondary | ICD-10-CM | POA: Diagnosis not present

## 2020-09-10 ENCOUNTER — Telehealth: Payer: Self-pay | Admitting: Pharmacist

## 2020-09-10 ENCOUNTER — Telehealth: Payer: Self-pay

## 2020-09-10 NOTE — Progress Notes (Signed)
  Chronic Care Management   Outreach Note  09/10/2020 Name: Tonya [name] MRN: [id number] DOB: 1946/12/06  Referred by: [name], NP Reason for referral : No chief complaint on file.   An unsuccessful telephone outreach was attempted today. The patient was referred to the pharmacist for assistance with care management and care coordination.  Unable to leave message at either number listed for patient. Left message for patient's daughter, [name].  Follow Up Plan: Will  Collaborate with care guide for reschedule.  Junita Push. Kenton Kingfisher PharmD, [facility] [phone]

## 2020-09-13 ENCOUNTER — Telehealth: Payer: Self-pay

## 2020-09-13 NOTE — Chronic Care Management (AMB) (Signed)
  Care Management   Note  09/13/2020 Name: [name] MRN: [id number] DOB: [birth year]  Tonya [name] is a 74 y.o. year old female who is a primary care patient of [name], NP and is actively engaged with the care management team. I reached out to [name] by phone today to assist with re-scheduling a follow up visit with the Pharmacist  Follow up plan: Unsuccessful telephone outreach attempt made. The care management team will reach out to the patient again over the next 7 days.  If patient returns call to provider office, please advise to call [facility]  at [place], , [place], [place] [address] Direct Dial: [phone] [email or web address] Website: [email or web address]

## 2020-09-18 DIAGNOSIS — I129 Hypertensive chronic kidney disease with stage 1 through stage 4 chronic kidney disease, or unspecified chronic kidney disease: Secondary | ICD-10-CM | POA: Diagnosis not present

## 2020-09-18 DIAGNOSIS — D631 Anemia in chronic kidney disease: Secondary | ICD-10-CM | POA: Diagnosis not present

## 2020-09-18 DIAGNOSIS — E1121 Type 2 diabetes mellitus with diabetic nephropathy: Secondary | ICD-10-CM | POA: Diagnosis not present

## 2020-09-18 DIAGNOSIS — R809 Proteinuria, unspecified: Secondary | ICD-10-CM | POA: Diagnosis not present

## 2020-09-18 DIAGNOSIS — N2581 Secondary hyperparathyroidism of renal origin: Secondary | ICD-10-CM | POA: Diagnosis not present

## 2020-09-18 DIAGNOSIS — N184 Chronic kidney disease, stage 4 (severe): Secondary | ICD-10-CM | POA: Diagnosis not present

## 2020-09-19 NOTE — Telephone Encounter (Signed)
Pt has been r/s  

## 2020-09-19 NOTE — Chronic Care Management (AMB) (Signed)
  Care Management   Note  09/19/2020 Name: Tonya [name] MRN: [id number] DOB: Jan 07, 1946  Tonya [name] is a 74 y.o. year old female who is a primary care patient of [name], NP and is actively engaged with the care management team. I reached out to [name] by phone today to assist with re-scheduling a follow up visit with the Pharmacist  Follow up plan: Telephone appointment with care management team member scheduled for:11/19/2020  [name], [place], [place], [place] [address] Direct Dial: [phone] [email or web address] Website: [email or web address]

## 2020-10-03 ENCOUNTER — Telehealth: Payer: Self-pay | Admitting: Nurse Practitioner

## 2020-10-03 ENCOUNTER — Ambulatory Visit: Payer: Self-pay | Admitting: *Deleted

## 2020-10-03 NOTE — Telephone Encounter (Signed)
Please Advise.  KP

## 2020-10-03 NOTE — Telephone Encounter (Signed)
Tried to call pt to relay [name]'s message no answer vm not set up

## 2020-10-03 NOTE — Telephone Encounter (Signed)
Scheduled for visit 10/05/20

## 2020-10-03 NOTE — Telephone Encounter (Signed)
  [employer] has called to advise office of elevations in BP with patient- call to patient- she is getting some elevated readings. Call to office- appointment scheduled. Reason for Disposition . Systolic BP  >= 025 OR Diastolic >= 852  Answer Assessment - Initial Assessment Questions 1. BLOOD PRESSURE: "What is the blood pressure?" "Did you take at least two measurements 5 minutes apart?"     172/94 (earlier today), 164/94 2. ONSET: "When did you take your blood pressure?"     11:20 3. HOW: "How did you obtain the blood pressure?" (e.g., visiting nurse, automatic home BP monitor)     Automatic cuff 4. HISTORY: "Do you have a history of high blood pressure?"     yes 5. MEDICATIONS: "Are you taking any medications for blood pressure?" "Have you missed any doses recently?"     Yes- no missed doses 6. OTHER SYMPTOMS: "Do you have any symptoms?" (e.g., headache, chest pain, blurred vision, difficulty breathing, weakness)     Back pain- comes and goes- none now 7. PREGNANCY: "Is there any chance you are pregnant?" "When was your last menstrual period?"     n/a  Protocols used: BLOOD PRESSURE - HIGH-A-AH

## 2020-10-03 NOTE — Telephone Encounter (Signed)
Noted and agree with visit plan.  If symptomatic prior to appointment with chest pain, shortness of breath, loss of function, headache then recommend ER visit immediately.  Thank you.

## 2020-10-03 NOTE — Telephone Encounter (Signed)
Sharyn Lull, from [facility], called stating that the pts BP was high this morning 184/99 and 182/104. She states that she has to make provider aware. Pt spoke with Nurse Triage. Please advise.

## 2020-10-04 DIAGNOSIS — I129 Hypertensive chronic kidney disease with stage 1 through stage 4 chronic kidney disease, or unspecified chronic kidney disease: Secondary | ICD-10-CM | POA: Diagnosis not present

## 2020-10-04 DIAGNOSIS — N2581 Secondary hyperparathyroidism of renal origin: Secondary | ICD-10-CM | POA: Diagnosis not present

## 2020-10-04 DIAGNOSIS — R809 Proteinuria, unspecified: Secondary | ICD-10-CM | POA: Diagnosis not present

## 2020-10-04 DIAGNOSIS — D539 Nutritional anemia, unspecified: Secondary | ICD-10-CM | POA: Diagnosis not present

## 2020-10-04 DIAGNOSIS — N184 Chronic kidney disease, stage 4 (severe): Secondary | ICD-10-CM | POA: Diagnosis not present

## 2020-10-05 ENCOUNTER — Other Ambulatory Visit: Payer: Self-pay

## 2020-10-05 ENCOUNTER — Encounter: Payer: Self-pay | Admitting: Family Medicine

## 2020-10-05 ENCOUNTER — Ambulatory Visit (INDEPENDENT_AMBULATORY_CARE_PROVIDER_SITE_OTHER): Payer: Medicare Other | Admitting: Family Medicine

## 2020-10-05 VITALS — BP 117/82 | HR 88 | Temp 98.2°F | Ht 67.0 in | Wt 172.0 lb

## 2020-10-05 DIAGNOSIS — M25511 Pain in right shoulder: Secondary | ICD-10-CM

## 2020-10-05 DIAGNOSIS — R079 Chest pain, unspecified: Secondary | ICD-10-CM

## 2020-10-05 MED ORDER — NAPROXEN 500 MG PO TABS: 500.0000 mg | ORAL_TABLET | Freq: Two times a day (BID) | ORAL | 0 refills | Status: DC

## 2020-10-05 MED ORDER — BACLOFEN 10 MG PO TABS: 10.0000 mg | ORAL_TABLET | Freq: Every day | ORAL | 0 refills | Status: DC

## 2020-10-05 NOTE — Patient Instructions (Signed)
Shoulder Exercises Ask your health care provider which exercises are safe for you. Do exercises exactly as told by your health care provider and adjust them as directed. It is normal to feel mild stretching, pulling, tightness, or discomfort as you do these exercises. Stop right away if you feel sudden pain or your pain gets worse. Do not begin these exercises until told by your health care provider. Stretching exercises External rotation and abduction This exercise is sometimes called corner stretch. This exercise rotates your arm outward (external rotation) and moves your arm out from your body (abduction). 1. Stand in a doorway with one of your feet slightly in front of the other. This is called a staggered stance. If you cannot reach your forearms to the door frame, stand facing a corner of a room. 2. Choose one of the following positions as told by your health care provider: ? Place your hands and forearms on the door frame above your head. ? Place your hands and forearms on the door frame at the height of your head. ? Place your hands on the door frame at the height of your elbows. 3. Slowly move your weight onto your front foot until you feel a stretch across your chest and in the front of your shoulders. Keep your head and chest upright and keep your abdominal muscles tight. 4. Hold for __________ seconds. 5. To release the stretch, shift your weight to your back foot. Repeat __________ times. Complete this exercise __________ times a day. Extension, standing 1. Stand and hold a broomstick, a cane, or a similar object behind your back. ? Your hands should be a little wider than shoulder width apart. ? Your palms should face away from your back. 2. Keeping your elbows straight and your shoulder muscles relaxed, move the stick away from your body until you feel a stretch in your shoulders (extension). ? Avoid shrugging your shoulders while you move the stick. Keep your shoulder blades tucked  down toward the middle of your back. 3. Hold for __________ seconds. 4. Slowly return to the starting position. Repeat __________ times. Complete this exercise __________ times a day. Range-of-motion exercises Pendulum  1. Stand near a wall or a surface that you can hold onto for balance. 2. Bend at the waist and let your left / right arm hang straight down. Use your other arm to support you. Keep your back straight and do not lock your knees. 3. Relax your left / right arm and shoulder muscles, and move your hips and your trunk so your left / right arm swings freely. Your arm should swing because of the motion of your body, not because you are using your arm or shoulder muscles. 4. Keep moving your hips and trunk so your arm swings in the following directions, as told by your health care provider: ? Side to side. ? Forward and backward. ? In clockwise and counterclockwise circles. 5. Continue each motion for __________ seconds, or for as long as told by your health care provider. 6. Slowly return to the starting position. Repeat __________ times. Complete this exercise __________ times a day. Shoulder flexion, standing  1. Stand and hold a broomstick, a cane, or a similar object. Place your hands a little more than shoulder width apart on the object. Your left / right hand should be palm up, and your other hand should be palm down. 2. Keep your elbow straight and your shoulder muscles relaxed. Push the stick up with your healthy arm to   raise your left / right arm in front of your body, and then over your head until you feel a stretch in your shoulder (flexion). ? Avoid shrugging your shoulder while you raise your arm. Keep your shoulder blade tucked down toward the middle of your back. 3. Hold for __________ seconds. 4. Slowly return to the starting position. Repeat __________ times. Complete this exercise __________ times a day. Shoulder abduction, standing 1. Stand and hold a broomstick,  a cane, or a similar object. Place your hands a little more than shoulder width apart on the object. Your left / right hand should be palm up, and your other hand should be palm down. 2. Keep your elbow straight and your shoulder muscles relaxed. Push the object across your body toward your left / right side. Raise your left / right arm to the side of your body (abduction) until you feel a stretch in your shoulder. ? Do not raise your arm above shoulder height unless your health care provider tells you to do that. ? If directed, raise your arm over your head. ? Avoid shrugging your shoulder while you raise your arm. Keep your shoulder blade tucked down toward the middle of your back. 3. Hold for __________ seconds. 4. Slowly return to the starting position. Repeat __________ times. Complete this exercise __________ times a day. Internal rotation  1. Place your left / right hand behind your back, palm up. 2. Use your other hand to dangle an exercise band, a towel, or a similar object over your shoulder. Grasp the band with your left / right hand so you are holding on to both ends. 3. Gently pull up on the band until you feel a stretch in the front of your left / right shoulder. The movement of your arm toward the center of your body is called internal rotation. ? Avoid shrugging your shoulder while you raise your arm. Keep your shoulder blade tucked down toward the middle of your back. 4. Hold for __________ seconds. 5. Release the stretch by letting go of the band and lowering your hands. Repeat __________ times. Complete this exercise __________ times a day. Strengthening exercises External rotation  1. Sit in a stable chair without armrests. 2. Secure an exercise band to a stable object at elbow height on your left / right side. 3. Place a soft object, such as a folded towel or a small pillow, between your left / right upper arm and your body to move your elbow about 4 inches (10 cm) away  from your side. 4. Hold the end of the exercise band so it is tight and there is no slack. 5. Keeping your elbow pressed against the soft object, slowly move your forearm out, away from your abdomen (external rotation). Keep your body steady so only your forearm moves. 6. Hold for __________ seconds. 7. Slowly return to the starting position. Repeat __________ times. Complete this exercise __________ times a day. Shoulder abduction  1. Sit in a stable chair without armrests, or stand up. 2. Hold a __________ weight in your left / right hand, or hold an exercise band with both hands. 3. Start with your arms straight down and your left / right palm facing in, toward your body. 4. Slowly lift your left / right hand out to your side (abduction). Do not lift your hand above shoulder height unless your health care provider tells you that this is safe. ? Keep your arms straight. ? Avoid shrugging your shoulder while you   do this movement. Keep your shoulder blade tucked down toward the middle of your back. 5. Hold for __________ seconds. 6. Slowly lower your arm, and return to the starting position. Repeat __________ times. Complete this exercise __________ times a day. Shoulder extension 1. Sit in a stable chair without armrests, or stand up. 2. Secure an exercise band to a stable object in front of you so it is at shoulder height. 3. Hold one end of the exercise band in each hand. Your palms should face each other. 4. Straighten your elbows and lift your hands up to shoulder height. 5. Step back, away from the secured end of the exercise band, until the band is tight and there is no slack. 6. Squeeze your shoulder blades together as you pull your hands down to the sides of your thighs (extension). Stop when your hands are straight down by your sides. Do not let your hands go behind your body. 7. Hold for __________ seconds. 8. Slowly return to the starting position. Repeat __________ times.  Complete this exercise __________ times a day. Shoulder row 1. Sit in a stable chair without armrests, or stand up. 2. Secure an exercise band to a stable object in front of you so it is at waist height. 3. Hold one end of the exercise band in each hand. Position your palms so that your thumbs are facing the ceiling (neutral position). 4. Bend each of your elbows to a 90-degree angle (right angle) and keep your upper arms at your sides. 5. Step back until the band is tight and there is no slack. 6. Slowly pull your elbows back behind you. 7. Hold for __________ seconds. 8. Slowly return to the starting position. Repeat __________ times. Complete this exercise __________ times a day. Shoulder press-ups  1. Sit in a stable chair that has armrests. Sit upright, with your feet flat on the floor. 2. Put your hands on the armrests so your elbows are bent and your fingers are pointing forward. Your hands should be about even with the sides of your body. 3. Push down on the armrests and use your arms to lift yourself off the chair. Straighten your elbows and lift yourself up as much as you comfortably can. ? Move your shoulder blades down, and avoid letting your shoulders move up toward your ears. ? Keep your feet on the ground. As you get stronger, your feet should support less of your body weight as you lift yourself up. 4. Hold for __________ seconds. 5. Slowly lower yourself back into the chair. Repeat __________ times. Complete this exercise __________ times a day. Wall push-ups  1. Stand so you are facing a stable wall. Your feet should be about one arm-length away from the wall. 2. Lean forward and place your palms on the wall at shoulder height. 3. Keep your feet flat on the floor as you bend your elbows and lean forward toward the wall. 4. Hold for __________ seconds. 5. Straighten your elbows to push yourself back to the starting position. Repeat __________ times. Complete this exercise  __________ times a day. This information is not intended to replace advice given to you by your health care provider. Make sure you discuss any questions you have with your health care provider. Document Revised: 03/25/2019 Document Reviewed: 12/31/2018 Elsevier Patient Education  2020 [employer]  

## 2020-10-05 NOTE — Progress Notes (Signed)
BP 117/82   Pulse 88   Temp 98.2 F (36.8 C) (Oral)   Ht 5\' 7"  (1.702 m)   Wt 172 lb (78 kg)   LMP  (LMP Unknown)   SpO2 100%   BMI 26.94 kg/m    Subjective:    Patient ID: Tonya [name], female    DOB: [birth year], 74 y.o.   MRN: [id number]  HPI: Tonya [name] is a 74 y.o. female  Chief Complaint  Patient presents with  . Hypertension  . Shoulder Pain    Right shoulder pain x 1 week. Turned her matress around in her room and thinks thi smay have triggered the pain .  [name] Flu Vaccine   HYPERTENSION Hypertension status: running high at home  Satisfied with current treatment? yes Duration of hypertension: chronic BP monitoring frequency:  daily BP range: 160 this AM, 170/99 BP medication side effects:  no Medication compliance: excellent compliance Previous BP meds: olmesartan, carvedilol, lasix Aspirin: yes Recurrent headaches: no Visual changes: no Palpitations: no Dyspnea: no Chest pain: yes Lower extremity edema: yes Dizzy/lightheaded: no  SHOULDER PAIN Duration: about a week and a half Involved shoulder: right Mechanism of injury: sleeping on a turned mattress, turning the mattress Location: posterior Onset:sudden Severity: severe  Quality:  Dull ache, pressure Frequency: intermittent Radiation: yes down R arm Aggravating factors: pressure   Alleviating factors: APAP  Status: stable Treatments attempted: APAP  Relief with NSAIDs?:  No NSAIDs Taken Weakness: no Numbness: no Decreased grip strength: no Redness: no Swelling: no Bruising: no Fevers: no   Relevant past medical, surgical, family and social history reviewed and updated as indicated. Interim medical history since our last visit reviewed. Allergies and medications reviewed and updated.  Review of Systems  Constitutional: Negative.   HENT: Negative.   Respiratory: Negative.   Cardiovascular: Positive for chest pain. Negative for palpitations and leg swelling.  Gastrointestinal:  Negative.   Musculoskeletal: Positive for arthralgias and myalgias. Negative for back pain, gait problem, joint swelling, neck pain and neck stiffness.  Neurological: Negative.   Psychiatric/Behavioral: Negative.     Per HPI unless specifically indicated above     Objective:    BP 117/82   Pulse 88   Temp 98.2 F (36.8 C) (Oral)   Ht 5\' 7"  (1.702 m)   Wt 172 lb (78 kg)   LMP  (LMP Unknown)   SpO2 100%   BMI 26.94 kg/m   Wt Readings from Last 3 Encounters:  10/05/20 172 lb (78 kg)  07/30/20 170 lb (77.1 kg)  07/25/20 173 lb 12.8 oz (78.8 kg)    Physical Exam Vitals and nursing note reviewed.  Constitutional:      General: She is not in acute distress.    Appearance: Normal appearance. She is not ill-appearing, toxic-appearing or diaphoretic.  HENT:     Head: Normocephalic and atraumatic.     Right Ear: External ear normal.     Left Ear: External ear normal.     Nose: Nose normal.     Mouth/Throat:     Mouth: Mucous membranes are moist.     Pharynx: Oropharynx is clear.  Eyes:     General: No scleral icterus.       Right eye: No discharge.        Left eye: No discharge.     Extraocular Movements: Extraocular movements intact.     Conjunctiva/sclera: Conjunctivae normal.     Pupils: Pupils are equal, round, and reactive to light.  Cardiovascular:     Rate and Rhythm: Normal rate and regular rhythm.     Pulses: Normal pulses.     Heart sounds: Normal heart sounds. No murmur heard.  No friction rub. No gallop.   Pulmonary:     Effort: Pulmonary effort is normal. No respiratory distress.     Breath sounds: Normal breath sounds. No stridor. No wheezing, rhonchi or rales.  Chest:     Chest wall: No tenderness.  Musculoskeletal:        General: Tenderness present.     Cervical back: Normal range of motion and neck supple.     Comments: Decreased ROM to R shoulder, tenderness to palpation of anterior R chest, hypertonicity and tenderness to R parascapular muscles    Skin:    General: Skin is warm and dry.     Capillary Refill: Capillary refill takes less than 2 seconds.     Coloration: Skin is not jaundiced or pale.     Findings: No bruising, erythema, lesion or rash.  Neurological:     General: No focal deficit present.     Mental Status: She is alert and oriented to person, place, and time. Mental status is at baseline.  Psychiatric:        Mood and Affect: Mood normal.        Behavior: Behavior normal.        Thought Content: Thought content normal.        Judgment: Judgment normal.     Results for orders placed or performed in visit on 08/29/20  Hemoglobin A1c  Result Value Ref Range   Hemoglobin A1C 7.6       Assessment & Plan:   Problem List Items Addressed This Visit    None    Visit Diagnoses    Right-sided chest pain    -  Primary   No changes on EKG today. Likely due to shoulder/neck pain- will check x-rays and treat with naproxen and baclofen and exercises. Call with any concerns.    Relevant Orders   EKG 12-Lead (Completed)   Acute pain of right shoulder       Will check x-rays and treat with naproxen (only 2 weeks due to clopidigrel) and baclofen and exercises. Call with any concerns. Recheck 2 weeks.   Relevant Orders   DG Cervical Spine Complete   DG Shoulder Right       Follow up plan: Return As scheduled.  >25 minutes spent with patient today

## 2020-10-08 NOTE — Progress Notes (Signed)
Interpreted by me on 10/05/20. NSR at 78bpm, flipped t-waves, but no change from prior

## 2020-10-09 ENCOUNTER — Ambulatory Visit
Admission: RE | Admit: 2020-10-09 | Discharge: 2020-10-09 | Disposition: A | Discharge status: Home / Self Care | Payer: Medicare Other | Source: Ambulatory Visit | Attending: Family Medicine | Admitting: Family Medicine

## 2020-10-09 ENCOUNTER — Ambulatory Visit
Admission: RE | Admit: 2020-10-09 | Discharge: 2020-10-09 | Disposition: A | Discharge status: Home / Self Care | Payer: Medicare Other | Attending: Family Medicine | Admitting: Family Medicine

## 2020-10-09 ENCOUNTER — Other Ambulatory Visit: Payer: Self-pay

## 2020-10-09 DIAGNOSIS — M25511 Pain in right shoulder: Secondary | ICD-10-CM | POA: Insufficient documentation

## 2020-10-09 DIAGNOSIS — M50321 Other cervical disc degeneration at C4-C5 level: Secondary | ICD-10-CM | POA: Diagnosis not present

## 2020-10-09 DIAGNOSIS — M50322 Other cervical disc degeneration at C5-C6 level: Secondary | ICD-10-CM | POA: Diagnosis not present

## 2020-10-09 DIAGNOSIS — M50323 Other cervical disc degeneration at C6-C7 level: Secondary | ICD-10-CM | POA: Diagnosis not present

## 2020-10-17 ENCOUNTER — Telehealth: Payer: Medicare Other | Admitting: General Practice

## 2020-10-17 ENCOUNTER — Other Ambulatory Visit: Payer: Self-pay | Admitting: Family Medicine

## 2020-10-17 ENCOUNTER — Ambulatory Visit: Payer: Self-pay | Admitting: General Practice

## 2020-10-17 DIAGNOSIS — E785 Hyperlipidemia, unspecified: Secondary | ICD-10-CM

## 2020-10-17 DIAGNOSIS — E1140 Type 2 diabetes mellitus with diabetic neuropathy, unspecified: Secondary | ICD-10-CM

## 2020-10-17 DIAGNOSIS — M25511 Pain in right shoulder: Secondary | ICD-10-CM

## 2020-10-17 DIAGNOSIS — I1310 Hypertensive heart and chronic kidney disease without heart failure, with stage 1 through stage 4 chronic kidney disease, or unspecified chronic kidney disease: Secondary | ICD-10-CM

## 2020-10-17 DIAGNOSIS — I5032 Chronic diastolic (congestive) heart failure: Secondary | ICD-10-CM

## 2020-10-17 DIAGNOSIS — I5189 Other ill-defined heart diseases: Secondary | ICD-10-CM

## 2020-10-17 DIAGNOSIS — E1165 Type 2 diabetes mellitus with hyperglycemia: Secondary | ICD-10-CM

## 2020-10-17 DIAGNOSIS — E1169 Type 2 diabetes mellitus with other specified complication: Secondary | ICD-10-CM

## 2020-10-17 NOTE — Patient Instructions (Signed)
Visit Information  Goals Addressed            This Visit's Progress   . RNCM: I have a lot of health problems but I am doing okay       Current Barriers:  . Chronic Disease Management support, education, and care coordination needs related to CHF, HTN, HLD, DMII, and CKD Stage 4  Clinical Goal(s) related to CHF, HTN, HLD, DMII, and CKD Stage 4 :  Over the next 120 days, patient will:  . Work with the care management team to address educational, disease management, and care coordination needs  . Begin or continue self health monitoring activities as directed today Measure and record cbg (blood glucose) bid times daily, Measure and record blood pressure 5 times per week, and Measure and record weight daily- currently does not have a scale . Call provider office for new or worsened signs and symptoms Blood glucose findings outside established parameters, Blood pressure findings outside established parameters, Weight outside established parameters, Shortness of breath, and New or worsened symptom related to CKD 4 . Call care management team with questions or concerns . Verbalize basic understanding of patient centered plan of care established today  Interventions related to CHF, HTN, HLD, DMII, and CKD Stage 4 :  . Evaluation of current treatment plans and patient's adherence to plan as established by provider.  10-17-2020: The patient states she is doing well. Her shoulder pain is a lot better since seeing pcp on 10-05-2020 and she is taking the baclofen.  The patient is using her equipment she received from [facility]. States her blood pressure is up and down. States this am it was up to 419 systolic. She states it is likely because her "machine" goes off at 9 am and she gets up at 10 am and then she hasn't bee moving around or taking her medication yet.  The patient states that she has not had any issues with headache.  Will continue to monitor.  . Assessed patient understanding of disease states.  The  patient verbalized understanding of disease processes.  States compliance with medications.  . Assessed patient's education and care coordination needs.  10-17-2020: Review of new concerns and the patient denies any new concerns. She does have to follow up with nephrology, Dr. Milana Na on 10-24-2020.  She says I guess they found something on the lab work he wants to talk to the patient about. Reassured the patient to keep appointment and know that her healthcare team wants to help manage her health and well being.  . Provided disease specific education to patient. Education on following a heart healthy/ADA diet. The patient states she does not use salt.  Her hemoglobin A1C is 8.5 and indicates uncontrolled diabetes.  Education on the benefits of following a heart healthy/ADA diet.  Will send educational material to the patient concerning carb modified diet and also DASH diet. 07-25-2020: In office visit with the pcp. The patients hemoglobin A1C is down to 7.6%. Praised for making progress. Saw endocrinologist on 07-23-2020 and is working on getting the [name].  The patient has been trying to get this for a while but admits it is probably her fault because she didn't get the paperwork done.  Reviewed and gave the patient written information on DM management with a goal of fasting blood sugars being <130 and post prandial <180.  The patient states this am her blood sugar was 87; however some mornings it is >160.  The patient states when she  checks it at night it is 130-200's.  Review of hemoglobin A1c of 7.0% or less.  The patient wants to do well with managing her DM and other chronic conditions. 10-17-2020: The patient states she is doing well with managing her diabetes. Denies any episodes of hypoglycemia.  Nash Dimmer with appropriate clinical care team members regarding patient needs.  The patient is currently working with the pharmacist with medications management.  The patient endorses compliance.  States she is taking her insulin and other medications as directed. The MD has filled out paperwork for her to get the [facility] continuous glucose meter. 07-25-2020: the patient is moving forward with getting her [facility] and also has a number to call to get medication assistance for her insulins.  . Assessed the patients ability to weigh daily and the patient verbalized she does now have a scale, (07-25-2020) the patient is currently enrolled in the case management program with her insurance company and has a scale and a blood pressure cuff. She is weighing daily and her weight is staying between 170-173.  Education provided. Review and explained the importance of daily weights due to her chronic condition of heart failure and the need to call the provider for >2 pound weight gain over night or >5 in one week. The patient verbalized understanding. 10-17-2020: The patient is weighing daily. Her weight this am was 169.   [name] Educated on the importance of elevating feet and legs when sitting and wearing ted hose or socks (patient did not have compression socks on at the time of the call). The patient verbalized she does have issues with swelling in her feet and legs.  The patient states that her swelling is not as bad when she wears sketchers shoes with the memory foam.  Discussed the importance of wearing ted hose to promote circulation. The patient verbalized understanding. 07-25-2020: The patient states that the swelling in her legs is better. The specialist has increased her Lasix. This is currently working well for her. Discussed the Sx/sx of dehydration and the importance of continued daily weights.  . Evaluation of blood pressure checks.   Education on taking blood pressure on a regular basis and writing values down for RNCM, CCM team and pcp so a pattern could be determined.  Education on elevated blood pressure putting the patient at increased risk and stroke. 07-25-2020: the patient brought her  home cuff into the office today. The reading was 163/81 with her home cuff compared to 157/63 with the office machine. The patient with some elevation of blood pressure and states this is what it has been running at home. Educational material provided as well as review of low sodium food options. Provided a log book for the patient to record her blood pressure readings daily. 10-17-2020: Review of how the patient can bring her Vivify tablet in and show the pcp the trend of her blood pressures.  . Education on the importance of following the recommendations of her providers due to the multiple chronic conditions she has. Expressed concern over her health and well being.   . Review of upcoming appointments. Pcp appointment is 8/11 at 11 am. The patient has had her eye exam in June of 2021. Has had colonoscopy, and a referral for mammogram ordered today. The patient continues to see the specialist at the pain clinic for nerve pain related to past shingles.  . Review of CCM team help and provided the patient with contact information for the CCM team RNCM.  Patient Self Care Activities related to CHF, HTN, HLD, DMII, and CKD Stage 4 :  . Patient is unable to independently self-manage chronic health conditions   Please see past updates related to this goal by clicking on the "Past Updates" button in the selected goal         Patient verbalizes understanding of instructions provided today.   Telephone follow up appointment with care management team member scheduled for: 12-12-2020 at 1:45 pm  [place], MSN, [facility] Mobile: [phone]

## 2020-10-17 NOTE — Chronic Care Management (AMB) (Signed)
Chronic Care Management   Follow Up Note   10/17/2020 Name: Tonya [name] MRN: [id number] DOB: 07-25-1946  Referred by: [name], NP Reason for referral : Chronic Care Management (RNCM: Follow up for Chronic Disease Management and Care Coordination Needs)   Tonya [name] is a 74 y.o. year old female who is a primary care patient of Cannady, [name], NP. The CCM team was consulted for assistance with chronic disease management and care coordination needs.    Review of patient status, including review of consultants reports, relevant laboratory and other test results, and collaboration with appropriate care team members and the patient's provider was performed as part of comprehensive patient evaluation and provision of chronic care management services.    SDOH (Social Determinants of Health) assessments performed: Yes See Care Plan activities for detailed interventions related to [name])     Outpatient Encounter Medications as of 10/17/2020  Medication Sig Note  . aspirin 81 MG tablet Take 81 mg by mouth daily.   . baclofen (LIORESAL) 10 MG tablet Take 1 tablet (10 mg total) by mouth at bedtime.   . carvedilol (COREG) 3.125 MG tablet TAKE 1 TABLET BY MOUTH  TWICE DAILY WITH A MEAL   . clopidogrel (PLAVIX) 75 MG tablet TAKE 1 TABLET BY MOUTH  DAILY   . DULoxetine (CYMBALTA) 20 MG capsule Take 1 capsule (20 mg total) by mouth daily.   . ferrous sulfate 325 (65 FE) MG EC tablet Take 1 tablet (325 mg total) by mouth 3 (three) times daily with meals. 01/11/2020: Taking twice daily   . furosemide (LASIX) 40 MG tablet Take 40 mg by mouth daily.   [name] HUMALOG KWIKPEN 100 UNIT/ML KiwkPen TK 12 UNITS BEFORE LUNCH AND 8 UNITS BEFORE SUPPER. 03/09/2020: 6 units  . insulin detemir (LEVEMIR) 100 UNIT/ML injection Inject 10 Units into the skin every morning.    . naproxen (NAPROSYN) 500 MG tablet TAKE 1 TABLET(500 MG) BY MOUTH TWICE DAILY WITH A MEAL   . NOVOFINE 32G X 6 MM MISC USE AS DIRECTED  THREE TIMES DAILY WITH HUMALOG   . olmesartan (BENICAR) 40 MG tablet Take 1 tablet (40 mg total) by mouth daily.   [name] omeprazole (PRILOSEC) 20 MG capsule TAKE 1 CAPSULE BY MOUTH  TWICE DAILY   . pioglitazone (ACTOS) 30 MG tablet Take 30 mg by mouth daily.   . rosuvastatin (CRESTOR) 5 MG tablet TAKE 1 TABLET BY MOUTH  DAILY   . Vitamin D, Cholecalciferol, 50 MCG (2000 UT) CAPS Take 2,000 Units by mouth daily.    No facility-administered encounter medications on file as of 10/17/2020.     Objective:   Goals Addressed            This Visit's Progress   . RNCM: I have a lot of health problems but I am doing okay       Current Barriers:  . Chronic Disease Management support, education, and care coordination needs related to CHF, HTN, HLD, DMII, and CKD Stage 4  Clinical Goal(s) related to CHF, HTN, HLD, DMII, and CKD Stage 4 :  Over the next 120 days, patient will:  . Work with the care management team to address educational, disease management, and care coordination needs  . Begin or continue self health monitoring activities as directed today Measure and record cbg (blood glucose) bid times daily, Measure and record blood pressure 5 times per week, and Measure and record weight daily- currently does not have a scale .  Call provider office for new or worsened signs and symptoms Blood glucose findings outside established parameters, Blood pressure findings outside established parameters, Weight outside established parameters, Shortness of breath, and New or worsened symptom related to CKD 4 . Call care management team with questions or concerns . Verbalize basic understanding of patient centered plan of care established today  Interventions related to CHF, HTN, HLD, DMII, and CKD Stage 4 :  . Evaluation of current treatment plans and patient's adherence to plan as established by provider.  10-17-2020: The patient states she is doing well. Her shoulder pain is a lot better since seeing pcp on  10-05-2020 and she is taking the baclofen.  The patient is using her equipment she received from [facility]. States her blood pressure is up and down. States this am it was up to 353 systolic. She states it is likely because her "machine" goes off at 9 am and she gets up at 10 am and then she hasn't bee moving around or taking her medication yet.  The patient states that she has not had any issues with headache.  Will continue to monitor.  . Assessed patient understanding of disease states.  The patient verbalized understanding of disease processes.  States compliance with medications.  . Assessed patient's education and care coordination needs.  10-17-2020: Review of new concerns and the patient denies any new concerns. She does have to follow up with nephrology, Dr. Milana Na on 10-24-2020.  She says I guess they found something on the lab work he wants to talk to the patient about. Reassured the patient to keep appointment and know that her healthcare team wants to help manage her health and well being.  . Provided disease specific education to patient. Education on following a heart healthy/ADA diet. The patient states she does not use salt.  Her hemoglobin A1C is 8.5 and indicates uncontrolled diabetes.  Education on the benefits of following a heart healthy/ADA diet.  Will send educational material to the patient concerning carb modified diet and also DASH diet. 07-25-2020: In office visit with the pcp. The patients hemoglobin A1C is down to 7.6%. Praised for making progress. Saw endocrinologist on 07-23-2020 and is working on getting the [name].  The patient has been trying to get this for a while but admits it is probably her fault because she didn't get the paperwork done.  Reviewed and gave the patient written information on DM management with a goal of fasting blood sugars being <130 and post prandial <180.  The patient states this am her blood sugar was 87; however some mornings it is >160.  The patient  states when she checks it at night it is 130-200's.  Review of hemoglobin A1c of 7.0% or less.  The patient wants to do well with managing her DM and other chronic conditions. 10-17-2020: The patient states she is doing well with managing her diabetes. Denies any episodes of hypoglycemia.  Nash Dimmer with appropriate clinical care team members regarding patient needs.  The patient is currently working with the pharmacist with medications management.  The patient endorses compliance. States she is taking her insulin and other medications as directed. The MD has filled out paperwork for her to get the [facility] continuous glucose meter. 07-25-2020: the patient is moving forward with getting her [facility] and also has a number to call to get medication assistance for her insulins.  . Assessed the patients ability to weigh daily and the patient verbalized she does now  have a scale, (07-25-2020) the patient is currently enrolled in the case management program with her insurance company and has a scale and a blood pressure cuff. She is weighing daily and her weight is staying between 170-173.  Education provided. Review and explained the importance of daily weights due to her chronic condition of heart failure and the need to call the provider for >2 pound weight gain over night or >5 in one week. The patient verbalized understanding. 10-17-2020: The patient is weighing daily. Her weight this am was 169.   [name] Educated on the importance of elevating feet and legs when sitting and wearing ted hose or socks (patient did not have compression socks on at the time of the call). The patient verbalized she does have issues with swelling in her feet and legs.  The patient states that her swelling is not as bad when she wears sketchers shoes with the memory foam.  Discussed the importance of wearing ted hose to promote circulation. The patient verbalized understanding. 07-25-2020: The patient states that the swelling  in her legs is better. The specialist has increased her Lasix. This is currently working well for her. Discussed the Sx/sx of dehydration and the importance of continued daily weights.  . Evaluation of blood pressure checks.   Education on taking blood pressure on a regular basis and writing values down for RNCM, CCM team and pcp so a pattern could be determined.  Education on elevated blood pressure putting the patient at increased risk and stroke. 07-25-2020: the patient brought her home cuff into the office today. The reading was 163/81 with her home cuff compared to 157/63 with the office machine. The patient with some elevation of blood pressure and states this is what it has been running at home. Educational material provided as well as review of low sodium food options. Provided a log book for the patient to record her blood pressure readings daily. 10-17-2020: Review of how the patient can bring her Vivify tablet in and show the pcp the trend of her blood pressures.  . Education on the importance of following the recommendations of her providers due to the multiple chronic conditions she has. Expressed concern over her health and well being.   . Review of upcoming appointments. Pcp appointment is 8/11 at 11 am. The patient has had her eye exam in June of 2021. Has had colonoscopy, and a referral for mammogram ordered today. The patient continues to see the specialist at the pain clinic for nerve pain related to past shingles.  . Review of CCM team help and provided the patient with contact information for the CCM team RNCM.     Patient Self Care Activities related to CHF, HTN, HLD, DMII, and CKD Stage 4 :  . Patient is unable to independently self-manage chronic health conditions   Please see past updates related to this goal by clicking on the "Past Updates" button in the selected goal          Plan:   Telephone follow up appointment with care management team member scheduled for: 12-12-2020  at 1:45 pm   [name] RN, MSN, [facility] Mobile: [phone]

## 2020-10-17 NOTE — Telephone Encounter (Signed)
Requested medications are due for refill today? Yes  Requested medications are on active medication list?  Yes  Last Refill:   10/05/2020  # 30 with no refills   Future visit scheduled? Yes  Notes to Clinic:  Medication failed Rx refill protocol due abnormal lab values.  Please review. Thank you.

## 2020-10-19 ENCOUNTER — Encounter: Payer: Self-pay | Admitting: Nurse Practitioner

## 2020-10-24 DIAGNOSIS — N184 Chronic kidney disease, stage 4 (severe): Secondary | ICD-10-CM | POA: Diagnosis not present

## 2020-10-24 DIAGNOSIS — E1121 Type 2 diabetes mellitus with diabetic nephropathy: Secondary | ICD-10-CM | POA: Diagnosis not present

## 2020-10-24 DIAGNOSIS — N2581 Secondary hyperparathyroidism of renal origin: Secondary | ICD-10-CM | POA: Diagnosis not present

## 2020-10-24 DIAGNOSIS — D631 Anemia in chronic kidney disease: Secondary | ICD-10-CM | POA: Diagnosis not present

## 2020-10-24 DIAGNOSIS — I129 Hypertensive chronic kidney disease with stage 1 through stage 4 chronic kidney disease, or unspecified chronic kidney disease: Secondary | ICD-10-CM | POA: Diagnosis not present

## 2020-10-25 ENCOUNTER — Encounter: Payer: Self-pay | Admitting: Nurse Practitioner

## 2020-10-25 ENCOUNTER — Ambulatory Visit (INDEPENDENT_AMBULATORY_CARE_PROVIDER_SITE_OTHER): Payer: Medicare Other | Admitting: Nurse Practitioner

## 2020-10-25 ENCOUNTER — Other Ambulatory Visit: Payer: Self-pay

## 2020-10-25 DIAGNOSIS — E1169 Type 2 diabetes mellitus with other specified complication: Secondary | ICD-10-CM | POA: Diagnosis not present

## 2020-10-25 DIAGNOSIS — D631 Anemia in chronic kidney disease: Secondary | ICD-10-CM

## 2020-10-25 DIAGNOSIS — I5032 Chronic diastolic (congestive) heart failure: Secondary | ICD-10-CM | POA: Diagnosis not present

## 2020-10-25 DIAGNOSIS — N2581 Secondary hyperparathyroidism of renal origin: Secondary | ICD-10-CM

## 2020-10-25 DIAGNOSIS — E1140 Type 2 diabetes mellitus with diabetic neuropathy, unspecified: Secondary | ICD-10-CM | POA: Diagnosis not present

## 2020-10-25 DIAGNOSIS — E1165 Type 2 diabetes mellitus with hyperglycemia: Secondary | ICD-10-CM

## 2020-10-25 DIAGNOSIS — I1310 Hypertensive heart and chronic kidney disease without heart failure, with stage 1 through stage 4 chronic kidney disease, or unspecified chronic kidney disease: Secondary | ICD-10-CM

## 2020-10-25 DIAGNOSIS — N184 Chronic kidney disease, stage 4 (severe): Secondary | ICD-10-CM

## 2020-10-25 DIAGNOSIS — B0229 Other postherpetic nervous system involvement: Secondary | ICD-10-CM

## 2020-10-25 DIAGNOSIS — Z794 Long term (current) use of insulin: Secondary | ICD-10-CM

## 2020-10-25 DIAGNOSIS — E785 Hyperlipidemia, unspecified: Secondary | ICD-10-CM | POA: Diagnosis not present

## 2020-10-25 LAB — BAYER DCA HB A1C WAIVED: HB A1C (BAYER DCA - WAIVED): 7.9 % — ABNORMAL HIGH (ref <7.0)

## 2020-10-25 NOTE — Assessment & Plan Note (Signed)
Chronic with poor tolerance to Gabapentin and Lyrica and minimal relief with simple treatment methods.  Currently on Cymbalta and followed by pain management, continue this collaboration.

## 2020-10-25 NOTE — Assessment & Plan Note (Signed)
Chronic, ongoing.  Continue current medication regimen and collaboration with nephrology.  BMP today.

## 2020-10-25 NOTE — Assessment & Plan Note (Signed)
Chronic, ongoing.  Monitor closely due to AM low BS.  Continue collaboration with endocrinology. 

## 2020-10-25 NOTE — Assessment & Plan Note (Signed)
Chronic, ongoing, followed by nephrology with recent labs obtained.  Continue collaboration with nephrology.   Recent note reviewed.

## 2020-10-25 NOTE — Assessment & Plan Note (Signed)
Chronic, ongoing.  Continue Rosuvastatin. Continue to collaborate with [facility].   Lipid panel today. 

## 2020-10-25 NOTE — Patient Instructions (Addendum)
Please remember to follow-up with pain clinic   Diabetes Mellitus and Nutrition, Adult When you have diabetes (diabetes mellitus), it is very important to have healthy eating habits because your blood sugar (glucose) levels are greatly affected by what you eat and drink. Eating healthy foods in the appropriate amounts, at about the same times every day, can help you:  Control your blood glucose.  Lower your risk of heart disease.  Improve your blood pressure.  Reach or maintain a healthy weight. Every person with diabetes is different, and each person has different needs for a meal plan. Your health care provider may recommend that you work with a diet and nutrition specialist (dietitian) to make a meal plan that is best for you. Your meal plan may vary depending on factors such as:  The calories you need.  The medicines you take.  Your weight.  Your blood glucose, blood pressure, and cholesterol levels.  Your activity level.  Other health conditions you have, such as heart or kidney disease. How do carbohydrates affect me? Carbohydrates, also called carbs, affect your blood glucose level more than any other type of food. Eating carbs naturally raises the amount of glucose in your blood. Carb counting is a method for keeping track of how many carbs you eat. Counting carbs is important to keep your blood glucose at a healthy level, especially if you use insulin or take certain oral diabetes medicines. It is important to know how many carbs you can safely have in each meal. This is different for every person. Your dietitian can help you calculate how many carbs you should have at each meal and for each snack. Foods that contain carbs include:  Bread, cereal, rice, pasta, and crackers.  Potatoes and corn.  Peas, beans, and lentils.  Milk and yogurt.  Fruit and juice.  Desserts, such as cakes, cookies, ice cream, and candy. How does alcohol affect me? Alcohol can cause a sudden  decrease in blood glucose (hypoglycemia), especially if you use insulin or take certain oral diabetes medicines. Hypoglycemia can be a life-threatening condition. Symptoms of hypoglycemia (sleepiness, dizziness, and confusion) are similar to symptoms of having too much alcohol. If your health care provider says that alcohol is safe for you, follow these guidelines:  Limit alcohol intake to no more than 1 drink per day for nonpregnant women and 2 drinks per day for men. One drink equals 12 oz of beer, 5 oz of wine, or 1 oz of hard liquor.  Do not drink on an empty stomach.  Keep yourself hydrated with water, diet soda, or unsweetened iced tea.  Keep in mind that regular soda, juice, and other mixers may contain a lot of sugar and must be counted as carbs. What are tips for following this plan?  Reading food labels  Start by checking the serving size on the "Nutrition Facts" label of packaged foods and drinks. The amount of calories, carbs, fats, and other nutrients listed on the label is based on one serving of the item. Many items contain more than one serving per package.  Check the total grams (g) of carbs in one serving. You can calculate the number of servings of carbs in one serving by dividing the total carbs by 15. For example, if a food has 30 g of total carbs, it would be equal to 2 servings of carbs.  Check the number of grams (g) of saturated and trans fats in one serving. Choose foods that have low or no  amount of these fats.  Check the number of milligrams (mg) of salt (sodium) in one serving. Most people should limit total sodium intake to less than 2,300 mg per day.  Always check the nutrition information of foods labeled as "low-fat" or "nonfat". These foods may be higher in added sugar or refined carbs and should be avoided.  Talk to your dietitian to identify your daily goals for nutrients listed on the label. Shopping  Avoid buying canned, premade, or processed foods.  These foods tend to be high in fat, sodium, and added sugar.  Shop around the outside edge of the grocery store. This includes fresh fruits and vegetables, bulk grains, fresh meats, and fresh dairy. Cooking  Use low-heat cooking methods, such as baking, instead of high-heat cooking methods like deep frying.  Cook using healthy oils, such as olive, canola, or sunflower oil.  Avoid cooking with butter, cream, or high-fat meats. Meal planning  Eat meals and snacks regularly, preferably at the same times every day. Avoid going long periods of time without eating.  Eat foods high in fiber, such as fresh fruits, vegetables, beans, and whole grains. Talk to your dietitian about how many servings of carbs you can eat at each meal.  Eat 4-6 ounces (oz) of lean protein each day, such as lean meat, chicken, fish, eggs, or tofu. One oz of lean protein is equal to: ? 1 oz of meat, chicken, or fish. ? 1 egg. ?  cup of tofu.  Eat some foods each day that contain healthy fats, such as avocado, nuts, seeds, and fish. Lifestyle  Check your blood glucose regularly.  Exercise regularly as told by your health care provider. This may include: ? 150 minutes of moderate-intensity or vigorous-intensity exercise each week. This could be brisk walking, biking, or water aerobics. ? Stretching and doing strength exercises, such as yoga or weightlifting, at least 2 times a week.  Take medicines as told by your health care provider.  Do not use any products that contain nicotine or tobacco, such as cigarettes and e-cigarettes. If you need help quitting, ask your health care provider.  Work with a Social worker or diabetes educator to identify strategies to manage stress and any emotional and social challenges. Questions to ask a health care provider  Do I need to meet with a diabetes educator?  Do I need to meet with a dietitian?  What number can I call if I have questions?  When are the best times to check  my blood glucose? Where to find more information:  American Diabetes Association: diabetes.org  Academy of Nutrition and Dietetics: www.eatright.[employer] of Diabetes and Digestive and Kidney Diseases (NIH): [employer] Summary  A healthy meal plan will help you control your blood glucose and maintain a healthy lifestyle.  Working with a diet and nutrition specialist (dietitian) can help you make a meal plan that is best for you.  Keep in mind that carbohydrates (carbs) and alcohol have immediate effects on your blood glucose levels. It is important to count carbs and to use alcohol carefully. This information is not intended to replace advice given to you by your health care provider. Make sure you discuss any questions you have with your health care provider. Document Revised: 11/13/2017 Document Reviewed: 01/05/2017 Elsevier Patient Education  2020 [employer].

## 2020-10-25 NOTE — Assessment & Plan Note (Signed)
Chronic, ongoing with recent EF 60-65%, euvolemic at this tim.  Continue current heart medication regimen and collaboration with cardiology + CCM team in office.  She needs lots of education on HF, needs to have diet reiterated each visit. - Reminded to call for an overnight weight gain of >2 pounds or a weekly weight weight of >5 pounds -- work with CCM team on obtaining scale. - not adding salt to his food and has been reading food labels. Reviewed the importance of keeping daily sodium intake to 2000mg  daily  - Wear compression hose daily -- not on today and not consistently wearing

## 2020-10-25 NOTE — Progress Notes (Signed)
BP 136/70   Pulse 80   Temp (!) 97.5 F (36.4 C)   Wt 174 lb 3.2 oz (79 kg)   LMP  (LMP Unknown)   SpO2 98%   BMI 27.28 kg/m    Subjective:    Patient ID: Tonya [name], female    DOB: 1946/10/13, 74 y.o.   MRN: [id number]  HPI: Tonya [name] is a 74 y.o. female  Chief Complaint  Patient presents with  . Diabetes  . Chronic Kidney Disease   DIABETES Last saw Dr. [name] 07/23/20 with A1C 7.6% -- sees her next on 19th.  Ordered a [employer].  Cardiology and PCP have recommended to endo discontinuation of Actos due to her HF, she reports this medication continues.  Does endorse she has been eating differently and focused more on diet.  Previously has tried [place] and [employer].  Freestyle -- average glucose 146, Target above 45%, in target 22%, low 33%. Hypoglycemic episodes: 28 lows per Freestyle Polydipsia/polyuria:no Visual disturbance:no Chest pain:no Paresthesias:no Glucose Monitoring:yes Accucheck frequency: TID  Fasting glucose: as above Post prandial: Evening: as above Before meals:              Taking Insulin?:yes Long acting insulin: Levemir 8 units in morning Short acting insulin: Humalog 6 units  Blood Pressure Monitoring:a few times a week Retinal Examination:Up to Date -- 06/01/20 Foot Exam:Up to Date Pneumovax:Up to Date Influenza:Up to Date Aspirin:yes  HYPERTENSION / HYPERLIPIDEMIA/HF Followed by cardiology, Dr. [name] Situ.  Continues on Carvedilol, Lasix, and Crestor. Last seen by cardiology on 01/05/20.  Her LVEF on 12/02/2019 was 60 to 65% with moderate LVH, and grade 1 diastolic dysfunction.  Discussed use of compression hose with her and she reports not using. Weighing self every day and denies increases. Satisfied with current treatment?yes Duration of hypertension:chronic BP monitoring frequency:a few times a week BP range:130-150/80-90's BP  medication side effects:no Duration of hyperlipidemia:chronic Cholesterol medication side effects:no Cholesterol supplements: none Medication compliance:good compliance Aspirin:yes Recent stressors:no Recurrent headaches:no Visual changes:no Palpitations:no Dyspnea:no Chest pain:no Lower extremity edema:no Dizzy/lightheaded:no  CHRONIC KIDNEY DISEASE Followed by nephrology and last seen 10/24/20. Has underlying CKD with 10/04/20 labs showing CRT 2.37 and GFR 23, PTH 84, BUN 45 with nephrology -- she reports he stopped Naproxen and Baclofen which she was given recently for right arm pain.  Is followed by hematology for chronic disease anemia and last seen 12/27/2019.  CKD status: stable Medications renally dose: yes Previous renal evaluation: yes Pneumovax:  Up to Date Influenza Vaccine:  Up to Date   POSTHERPETIC NEURALGIA: Followed by pain management and last seen by Dr. Holley Raring on 06/27/20 for injection.  Had shingles years ago and since that time has had ongoing issues with pain to lower right back. She is intolerant to Gabapentin and had issues with Pregabalin (change in mental status and hospital admission) + has tried Lidocaine patches with no benefit.  Has history of imaging to lumbar spine in 2016, where her pain is, and it showed "diffuse multilevel degenerative changes lumbar spine". Continues on Duloxetine.  Relevant past medical, surgical, family and social history reviewed and updated as indicated. Interim medical history since our last visit reviewed. Allergies and medications reviewed and updated.  Review of Systems  Constitutional: Negative for activity change, appetite change, diaphoresis, fatigue and fever.  Respiratory: Negative for cough, chest tightness and shortness of breath.   Cardiovascular: Negative for chest pain, palpitations and leg swelling.  Gastrointestinal: Negative for abdominal distention, abdominal pain, constipation,  diarrhea, nausea  and vomiting.  Endocrine: Negative for cold intolerance, heat intolerance, polydipsia, polyphagia and polyuria.  Musculoskeletal: Positive for back pain.  Neurological: Negative for dizziness, syncope, weakness, light-headedness, numbness and headaches.  Psychiatric/Behavioral: Negative.     Per HPI unless specifically indicated above     Objective:    BP 136/70   Pulse 80   Temp (!) 97.5 F (36.4 C)   Wt 174 lb 3.2 oz (79 kg)   LMP  (LMP Unknown)   SpO2 98%   BMI 27.28 kg/m   Wt Readings from Last 3 Encounters:  10/25/20 174 lb 3.2 oz (79 kg)  10/17/20 169 lb (76.7 kg)  10/05/20 172 lb (78 kg)    Physical Exam Vitals and nursing note reviewed.  Constitutional:      General: She is awake. She is not in acute distress.    Appearance: She is well-developed. She is not ill-appearing.  HENT:     Head: Normocephalic.     Right Ear: Hearing normal.     Left Ear: Hearing normal.  Eyes:     General: Lids are normal.        Right eye: No discharge.        Left eye: No discharge.     Conjunctiva/sclera: Conjunctivae normal.     Pupils: Pupils are equal, round, and reactive to light.  Neck:     Thyroid: No thyromegaly.     Vascular: No carotid bruit.  Cardiovascular:     Rate and Rhythm: Normal rate and regular rhythm.     Heart sounds: Normal heart sounds. No murmur. No gallop.   Pulmonary:     Effort: Pulmonary effort is normal. No accessory muscle usage or respiratory distress.     Breath sounds: Normal breath sounds.  Abdominal:     General: Bowel sounds are normal.     Palpations: Abdomen is soft.  Musculoskeletal:     Cervical back: Normal range of motion and neck supple.     Right lower leg: 1+ present.     Left lower leg: 1+ present.  Skin:    General: Skin is warm and dry.     Findings: No rash.  Neurological:     Mental Status: She is alert and oriented to person, place, and time.  Psychiatric:        Attention and Perception: Attention normal.         Mood and Affect: Mood normal.        Behavior: Behavior normal. Behavior is cooperative.        Thought Content: Thought content normal.        Judgment: Judgment normal.   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  Initial BP elevated, but repeat at goal range -- occasional elevations at home.  Continue current medication regimen and collaboration with nephrology.  BMP and TSH today.  Recommend she continue to monitor BP at home daily and document for providers + focus on DASH diet.   Return in 3 months.      Relevant Orders   Bayer [facility]  Hb A1c Waived (Completed)   Basic metabolic panel   TSH   Chronic heart failure with preserved ejection fraction (HFpEF) (HCC)    Chronic, ongoing with recent EF 60-65%, euvolemic at this tim.  Continue current heart medication regimen and collaboration with cardiology + CCM team in office.  She needs lots of education on HF, needs to have diet reiterated each visit. - Reminded to call for an overnight weight gain of >2 pounds or a weekly weight weight of >5 pounds -- work with CCM team on obtaining scale. - not adding salt to his food and has been reading food labels. Reviewed the importance of keeping daily sodium intake to 2000mg  daily  - Wear compression hose daily -- not on today and not consistently wearing        Endocrine   Poorly controlled type 2 diabetes mellitus with neuropathy (HCC)    Chronic, ongoing, followed by endocrinology.  A1C today 7.9%. Continue current medication regimen and defer changes to endo at upcoming visit due to ongoing lows (33%) over past month.  Cardiology and PCP have recommended to endo discontinuing  Actos due to her HF.  Continue to collaborate with Dr. [name] and CCM team.  Recommend she monitor BS at least 3 times a day at home, continue consistent [place] use.  Return in 3 months.      Relevant Orders   Bayer DCA Hb A1c Waived (Completed)   Hyperlipidemia associated with type 2 diabetes mellitus (HCC)    Chronic, ongoing.  Continue Rosuvastatin. Continue to collaborate with [facility].  Lipid panel today.      Relevant Orders   Bayer DCA Hb A1c Waived (Completed)   Lipid Panel w/o Chol/HDL Ratio   Hyperparathyroidism due to renal insufficiency (HCC)    Chronic, ongoing, followed by nephrology with recent labs obtained.  Continue collaboration with nephrology.   Recent note reviewed.        Nervous and Auditory   Post herpetic neuralgia    Chronic with poor tolerance to Gabapentin and Lyrica and minimal relief with simple treatment methods.  Currently on Cymbalta and followed by pain management, continue this collaboration.        Genitourinary   Chronic kidney disease, stage 4, severely decreased GFR (HCC)    Chronic, ongoing.  Continue current medication regimen and collaboration with nephrology.  BMP today.        Other   Encounter for long-term (current) use of insulin (HCC)    Chronic, ongoing.  Monitor closely due to AM low BS.  Continue collaboration with endocrinology.      Anemia in CKD (chronic kidney disease)    Chronic with CKD.  Continue iron supplement daily and collaboration with hematology and nephrology teams.         Follow up plan: Return in about 3 months (around 01/25/2021) for T2DM, HTN/HLD, CHRONIC PAIN.

## 2020-10-25 NOTE — Assessment & Plan Note (Signed)
Chronic with CKD.  Continue iron supplement daily and collaboration with hematology and nephrology teams.

## 2020-10-25 NOTE — Assessment & Plan Note (Signed)
Chronic, ongoing.  Initial BP elevated, but repeat at goal range -- occasional elevations at home.  Continue current medication regimen and collaboration with nephrology.  BMP and TSH today.  Recommend she continue to monitor BP at home daily and document for providers + focus on DASH diet.   Return in 3 months.

## 2020-10-25 NOTE — Assessment & Plan Note (Signed)
Chronic, ongoing, followed by endocrinology.  A1C today 7.9%. Continue current medication regimen and defer changes to endo at upcoming visit due to ongoing lows (33%) over past month.  Cardiology and PCP have recommended to endo discontinuing Actos due to her HF.  Continue to collaborate with Dr. [name] and CCM team.  Recommend she monitor BS at least 3 times a day at home, continue consistent [place] use.  Return in 3 months.

## 2020-10-26 LAB — BASIC METABOLIC PANEL
BUN/Creatinine Ratio: 21 (ref 12–28)
BUN: 54 mg/dL — ABNORMAL HIGH (ref 8–27)
CO2: 21 mmol/L (ref 20–29)
Calcium: 9.2 mg/dL (ref 8.7–10.3)
Chloride: 110 mmol/L — ABNORMAL HIGH (ref 96–106)
Creatinine, Ser: 2.56 mg/dL — ABNORMAL HIGH (ref 0.57–1.00)
GFR calc Af Amer: 21 mL/min/{1.73_m2} — ABNORMAL LOW (ref >59)
GFR calc non Af Amer: 18 mL/min/{1.73_m2} — ABNORMAL LOW (ref >59)
Glucose: 109 mg/dL — ABNORMAL HIGH (ref 65–99)
Potassium: 4.6 mmol/L (ref 3.5–5.2)
Sodium: 145 mmol/L — ABNORMAL HIGH (ref 134–144)

## 2020-10-26 LAB — LIPID PANEL W/O CHOL/HDL RATIO
Cholesterol, Total: 176 mg/dL (ref 100–199)
HDL: 73 mg/dL (ref >39)
LDL Chol Calc (NIH): 87 mg/dL (ref 0–99)
Triglycerides: 86 mg/dL (ref 0–149)
VLDL Cholesterol Cal: 16 mg/dL (ref 5–40)

## 2020-10-26 LAB — TSH: TSH: 1.92 u[IU]/mL (ref 0.450–4.500)

## 2020-10-26 NOTE — Progress Notes (Signed)
Contacted via MyChart  Good evening Tonya [name], your labs have returned.  They continue to show baseline kidney disease.  Sodium level is elevated mildly, I recommend decreasing salt in your diet and adding a little water daily.  Will recheck next visit.  Cholesterol levels and thyroid remain stable.  Any questions? Keep being awesome!!  Thank you for allowing me to participate in your care. Kindest regards, Allex Madia

## 2020-10-31 ENCOUNTER — Other Ambulatory Visit: Payer: Self-pay | Admitting: Nurse Practitioner

## 2020-11-01 ENCOUNTER — Other Ambulatory Visit: Payer: Self-pay | Admitting: Family Medicine

## 2020-11-02 DIAGNOSIS — E1122 Type 2 diabetes mellitus with diabetic chronic kidney disease: Secondary | ICD-10-CM | POA: Diagnosis not present

## 2020-11-02 DIAGNOSIS — E1121 Type 2 diabetes mellitus with diabetic nephropathy: Secondary | ICD-10-CM | POA: Diagnosis not present

## 2020-11-02 DIAGNOSIS — I10 Essential (primary) hypertension: Secondary | ICD-10-CM | POA: Diagnosis not present

## 2020-11-02 DIAGNOSIS — E113393 Type 2 diabetes mellitus with moderate nonproliferative diabetic retinopathy without macular edema, bilateral: Secondary | ICD-10-CM | POA: Diagnosis not present

## 2020-11-02 DIAGNOSIS — Z794 Long term (current) use of insulin: Secondary | ICD-10-CM | POA: Diagnosis not present

## 2020-11-07 DIAGNOSIS — E1165 Type 2 diabetes mellitus with hyperglycemia: Secondary | ICD-10-CM | POA: Diagnosis not present

## 2020-11-07 DIAGNOSIS — Z794 Long term (current) use of insulin: Secondary | ICD-10-CM | POA: Diagnosis not present

## 2020-11-19 ENCOUNTER — Telehealth: Payer: Medicare Other

## 2020-11-19 NOTE — Chronic Care Management (AMB) (Incomplete)
Chronic Care Management Pharmacy  Name: Tonya [name]  MRN: [id number] DOB: [birth year]   Chief Complaint/ HPI  Tonya [name],  74 y.o. , female presents for her Follow-Up CCM visit with the clinical pharmacist via telephone.  PCP : [name], NP Patient Care Team: [name], NP as PCP - General (Nurse Practitioner) [name], MD as PCP - Cardiology (Cardiology) Solum, Betsey Holiday, MD as Physician Assistant (Endocrinology) Lavonia Dana, MD as Consulting Physician (Nephrology) [name] Situ Kathlene November, MD as Consulting Physician (Cardiology) Vanita Ingles, RN as Case Manager (General Practice) Vladimir Faster, [facility] (Pharmacist)  Patient's chronic conditions include: Hypertension, Diabetes, Chronic Kidney Disease and H/O CVA, Grade I diastolic dysfunction and chronic pain    Office Visits: 10/25/20- [name], NP- bloddwrok 10/05/20- Dr. Wynetta Emery- EKG, shoulder, C-spine imaging- rt chest pain-  No changes on EKG NSR, naproxen, baclofen, stretching  Consult Visit: 11/02/20- Dr. Nilda Simmer, endocrinology - levemir to 7 u, Humalog SSI 6-10 u 10/24/20- Dr. [name], Nephrology-  Avoid nsaids labs 01/05/20- [name] , NP - cardiology - ekg, recommend d/c Actos  Allergies  Allergen Reactions  . Atorvastatin     Myalgias   . Gabapentin Other (See Comments)    Speech impairment   . Pravastatin     Myalgias   . Pregabalin     Speech impairment  . Trulicity [Dulaglutide] Hives    Medications: Outpatient Encounter Medications as of 11/19/2020  Medication Sig Note  . aspirin 81 MG tablet Take 81 mg by mouth daily.   . carvedilol (COREG) 3.125 MG tablet TAKE 1 TABLET BY MOUTH  TWICE DAILY WITH A MEAL   . clopidogrel (PLAVIX) 75 MG tablet TAKE 1 TABLET BY MOUTH  DAILY   . DULoxetine (CYMBALTA) 20 MG capsule Take 1 capsule (20 mg total) by mouth daily.   . ferrous sulfate 325 (65 FE) MG EC tablet Take 1 tablet (325 mg total) by mouth 3 (three) times daily with  meals. 01/11/2020: Taking twice daily   . furosemide (LASIX) 40 MG tablet Take 40 mg by mouth daily.   [name] HUMALOG KWIKPEN 100 UNIT/ML KiwkPen TK 12 UNITS BEFORE LUNCH AND 8 UNITS BEFORE SUPPER. 03/09/2020: 6 units  . insulin detemir (LEVEMIR) 100 UNIT/ML injection Inject 10 Units into the skin every morning.    [name] NOVOFINE 32G X 6 MM MISC USE AS DIRECTED THREE TIMES DAILY WITH HUMALOG   . olmesartan (BENICAR) 40 MG tablet Take 1 tablet (40 mg total) by mouth daily.   [name] omeprazole (PRILOSEC) 20 MG capsule TAKE 1 CAPSULE BY MOUTH  TWICE DAILY   . pioglitazone (ACTOS) 30 MG tablet Take 30 mg by mouth daily.   . rosuvastatin (CRESTOR) 5 MG tablet TAKE 1 TABLET BY MOUTH  DAILY   . Vitamin D, Cholecalciferol, 50 MCG (2000 UT) CAPS Take 2,000 Units by mouth daily.    No facility-administered encounter medications on file as of 11/19/2020.    Wt Readings from Last 3 Encounters:  10/25/20 174 lb 3.2 oz (79 kg)  10/17/20 169 lb (76.7 kg)  10/05/20 172 lb (78 kg)    Current Diagnosis/Assessment:    Goals Addressed   None     Diabetes   A1c goal {A1c goals:23924}  Recent Relevant Labs: Lab Results  Component Value Date/Time   HGBA1C 7.9 (H) 10/25/2020 11:24 AM   HGBA1C 7.6 07/09/2020 12:00 AM   HGBA1C 8.5 03/28/2020 12:00 AM   MICROALBUR 150 (H) 04/10/2020 11:34  AM   MICROALBUR 150 (H) 05/18/2019 11:02 AM    Last diabetic Eye exam:  Lab Results  Component Value Date/Time   HMDIABEYEEXA Retinopathy (A) 06/01/2020 12:00 AM    BMP Latest Ref Rng & Units 10/25/2020 06/04/2020 12/03/2019  Glucose 65 - 99 mg/dL 109(H) 170(H) 113(H)  BUN 8 - 27 mg/dL 54(H) 50(H) 56(H)  Creatinine 0.57 - 1.00 mg/dL 2.56(H) 2.36(H) 2.14(H)  BUN/Creat Ratio 12 - 28 21 21  -  Sodium 134 - 144 mmol/L 145(H) 146(H) 143  Potassium 3.5 - 5.2 mmol/L 4.6 4.6 4.8  Chloride 96 - 106 mmol/L 110(H) 113(H) 113(H)  CO2 20 - 29 mmol/L 21 19(L) 20(L)  Calcium 8.7 - 10.3 mg/dL 9.2 8.8 8.9  eGFR~18 mL/min  Last diabetic  Foot exam: No results found for: HMDIABFOOTEX   Checking BG: {CHL HP Blood Glucose Monitoring Frequency:[phone]}  Recent FBG Readings: *** Recent pre-meal BG readings: *** Recent 2hr PP BG readings:  *** Recent HS BG readings: ***  Patient has failed these meds in past: Trulicity rash, toujeo rash, metformin- ckd Patient is currently {CHL Controlled/Uncontrolled:[phone]} on the following medications: . Levemir 7 units qam . humalog 6-10 units SSI with meals  6 u if BG 71-200, 8 u if BG 201-250, 10 u if BG >251. Hold if not eating . Pioglitazone 30 mg? D/c  We discussed: diet and exercise extensively and how to recognize and treat signs of hypoglycemia . Patient follows with endocrinology, Dr. Nilda Simmer- PCP and Cardiology have recommended d/c pioglitazone. Patient has Freestyle libre 2 week average or 146 mg/dL at endo 11/02/20. Receiving insulin from [place] Plans:[phone]}  Hypertension/ CKD IV   BP goal is:  {CHL HP UPSTREAM Pharmacist BP [phone]}  Office blood pressures are  BP Readings from Last 3 Encounters:  10/25/20 136/70  10/05/20 117/82  07/25/20 134/70   Patient checks BP at home {CHL HP BP Monitoring Frequency:[phone]} Patient home BP readings are ranging: ***  Patient has failed these meds in the past: *** Patient is currently {CHL Controlled/Uncontrolled:[phone]} on the following medications:  . Olmesartan 40 mg qd . Furosemide 40 mg qd . Carvedilol 3.125 mg bid  We discussed {CHL HP Upstream Pharmacy discussion:[phone]}  Plan  Continue {CHL HP Upstream Pharmacy Plans:[phone]}       Heart Failure   Type: Diastolic  Last ejection fraction: 60-65% NYHA Class: I (no actitivty limitation) AHA HF Stage: B (Heart disease present - no symptoms present)  Patient has failed these meds in past: NA Patient is currently {CHL Controlled/Uncontrolled:[phone]} on the following  medications:  Furosemide 40 mg qd Carvedilol 3.125 mg bid   We discussed weighing daily; if you gain more than 2 pounds in one day or 5 pounds in one week call your doctor.  Compression socks limiting sodium intake to 2000 mg/day. Scale?  Plan  Continue {CHL HP Upstream Pharmacy Plans:[phone]}  Hyperlipidemia/ H/O CVA 2018   LDL goal < 70   Last lipids Lab Results  Component Value Date   CHOL 176 10/25/2020   HDL 73 10/25/2020   LDLCALC 87 10/25/2020   LDLDIRECT 67 12/30/2018   TRIG 86 10/25/2020   CHOLHDL 2.5 12/02/2019   Hepatic Function Latest Ref Rng & Units 12/02/2019 12/01/2019 06/24/2019  Total Protein 6.5 - 8.1 g/dL 5.8(L) 6.9 6.3(L)  Albumin 3.5 - 5.0 g/dL 3.1(L) 3.5 3.4(L)  AST 15 - 41 U/L 21 25 16   ALT 0 - 44 U/L 14 17  12  Alk Phosphatase 38 - 126 U/L 73 85 96  Total Bilirubin 0.3 - 1.2 mg/dL 0.6 0.6 0.4  Bilirubin, Direct 0.0 - 0.2 mg/dL - - -     The ASCVD Risk score ([place]., et al., 2013) failed to calculate for the following reasons:   The patient has a prior MI or stroke diagnosis   Patient has failed these meds in past:  Patient is currently uncontrolled on the following medications:  . Rosuvastatin 5 mg qd . Clopidogrel 75 mg qd ?  [name] Aspirin 81 mg qd ?  We discussed:  {CHL HP Upstream Pharmacy discussion:[phone]}  Plan  Continue {CHL HP Upstream Pharmacy Plans:[phone]}  Postherpetic Neuralgia/ Neuropathy   Patient has failed these meds in past: *** Patient is currently {CHL Controlled/Uncontrolled:[phone]} on the following medications:  . Duloxetine 20 mg qd  We discussed:  ***  Plan  Continue {CHL HP Upstream Pharmacy JLUNG:[id number]}   Medication Management   Patient's preferred pharmacy is:  [id number] [address], [address] [place] [place] [address] Phone: [phone] Fax: [phone]  [place], [address], [address], [address] [place] [address] Phone: [phone] Fax: [phone]  Uses pill box? {Yes or If no, why not?:20788} Pt endorses ***% compliance  We discussed: {Pharmacy options:24294}  Plan  {US Pharmacy IFXG:52712}    Follow up: *** month phone visit  ***

## 2020-11-30 DIAGNOSIS — H2512 Age-related nuclear cataract, left eye: Secondary | ICD-10-CM | POA: Diagnosis not present

## 2020-11-30 DIAGNOSIS — E113293 Type 2 diabetes mellitus with mild nonproliferative diabetic retinopathy without macular edema, bilateral: Secondary | ICD-10-CM | POA: Diagnosis not present

## 2020-12-12 ENCOUNTER — Telehealth: Payer: Self-pay

## 2020-12-12 ENCOUNTER — Telehealth: Payer: Self-pay | Admitting: General Practice

## 2020-12-12 NOTE — Telephone Encounter (Signed)
  Chronic Care Management   Outreach Note  12/12/2020 Name: Tonya [name] MRN: [id number] DOB: 10-07-1946  Referred by: [name], NP Reason for referral : Appointment (RNCM: Follow up for Chronic Disease Management and Care Coordination Needs)   Tonya [name] is enrolled in a Managed [place]: No  An unsuccessful telephone outreach was attempted today. The patient was referred to the case management team for assistance with care management and care coordination.   Follow Up Plan: A HIPAA compliant phone message was left for the patient providing contact information and requesting a return call.   [name] RN, MSN, [facility] Mobile: [phone]

## 2020-12-24 DIAGNOSIS — N2581 Secondary hyperparathyroidism of renal origin: Secondary | ICD-10-CM | POA: Diagnosis not present

## 2020-12-24 DIAGNOSIS — I129 Hypertensive chronic kidney disease with stage 1 through stage 4 chronic kidney disease, or unspecified chronic kidney disease: Secondary | ICD-10-CM | POA: Diagnosis not present

## 2020-12-24 DIAGNOSIS — E875 Hyperkalemia: Secondary | ICD-10-CM | POA: Diagnosis not present

## 2020-12-24 DIAGNOSIS — N184 Chronic kidney disease, stage 4 (severe): Secondary | ICD-10-CM | POA: Diagnosis not present

## 2020-12-24 DIAGNOSIS — E1121 Type 2 diabetes mellitus with diabetic nephropathy: Secondary | ICD-10-CM | POA: Diagnosis not present

## 2020-12-24 DIAGNOSIS — D631 Anemia in chronic kidney disease: Secondary | ICD-10-CM | POA: Diagnosis not present

## 2020-12-24 DIAGNOSIS — R809 Proteinuria, unspecified: Secondary | ICD-10-CM | POA: Diagnosis not present

## 2020-12-27 ENCOUNTER — Telehealth: Payer: Self-pay

## 2020-12-27 NOTE — Chronic Care Management (AMB) (Signed)
  Care Management   Note  12/27/2020 Name: Tonya [name] MRN: [id number] DOB: December 29, 1945  Tonya [name] is a 75 y.o. year old female who is a primary care patient of [name], NP and is actively engaged with the care management team. I reached out to [name] by phone today to assist with re-scheduling a follow up visit with the RN Case Manager  Follow up plan: Unsuccessful telephone outreach attempt made.The care management team will reach out to the patient again over the next 7 days.  If patient returns call to provider office, please advise to call [place]  at [place], [place], [place], [place] [address] Direct Dial: [phone] [email or web address] Website: [email or web address]

## 2021-01-07 ENCOUNTER — Other Ambulatory Visit: Payer: Self-pay

## 2021-01-07 ENCOUNTER — Ambulatory Visit
Admission: RE | Admit: 2021-01-07 | Discharge: 2021-01-07 | Disposition: A | Discharge status: Home / Self Care | Payer: Medicare Other | Source: Ambulatory Visit | Attending: Nurse Practitioner | Admitting: Nurse Practitioner

## 2021-01-07 DIAGNOSIS — Z1231 Encounter for screening mammogram for malignant neoplasm of breast: Secondary | ICD-10-CM | POA: Insufficient documentation

## 2021-01-09 NOTE — Chronic Care Management (AMB) (Signed)
  Care Management   Note  01/09/2021 Name: Tonya [name] MRN: [id number] DOB: [birth year]  Tonya [name] is a 75 y.o. year old female who is a primary care patient of [name], NP and is actively engaged with the care management team. I reached out to [name] by phone today to assist with re-scheduling a follow up visit with the RN Case Manager  Follow up plan: Telephone appointment with care management team member scheduled for:02/15/2021  [name], [place], [place], [place] [address] Direct Dial: [phone] [email or web address]

## 2021-01-09 NOTE — Telephone Encounter (Signed)
Pt has been rescheduled. 

## 2021-01-14 ENCOUNTER — Other Ambulatory Visit: Payer: Self-pay | Admitting: Nurse Practitioner

## 2021-01-14 NOTE — Telephone Encounter (Signed)
  Notes to clinic:  filled by a historical provider  Review for refill   Requested Prescriptions  Pending Prescriptions Disp Refills   furosemide (LASIX) 40 MG tablet [Pharmacy Med Name: FUROSEMIDE 40MG  TABLETS] 90 tablet     Sig: TAKE 1 TABLET BY MOUTH EVERY DAY      Cardiovascular:  Diuretics - Loop Failed - 01/14/2021  3:12 AM      Failed - Na in normal range and within 360 days    Sodium  Date Value Ref Range Status  10/25/2020 145 (H) 134 - 144 mmol/L Final          Failed - Cr in normal range and within 360 days    Creatinine, Ser  Date Value Ref Range Status  10/25/2020 2.56 (H) 0.57 - 1.00 mg/dL Final          Passed - K in normal range and within 360 days    Potassium  Date Value Ref Range Status  10/25/2020 4.6 3.5 - 5.2 mmol/L Final          Passed - Ca in normal range and within 360 days    Calcium  Date Value Ref Range Status  10/25/2020 9.2 8.7 - 10.3 mg/dL Final          Passed - Last BP in normal range    BP Readings from Last 1 Encounters:  10/25/20 136/70          Passed - Valid encounter within last 6 months    Recent Outpatient Visits           2 months ago Poorly controlled type 2 diabetes mellitus with neuropathy ([place])   [place], [place] T, NP   3 months ago Right-sided chest pain   [facility] [place], Megan P, DO   5 months ago Poorly controlled type 2 diabetes mellitus with neuropathy ([place])   [place], [name] T, NP   7 months ago 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

## 2021-01-24 DIAGNOSIS — E1159 Type 2 diabetes mellitus with other circulatory complications: Secondary | ICD-10-CM | POA: Diagnosis not present

## 2021-01-24 DIAGNOSIS — H2512 Age-related nuclear cataract, left eye: Secondary | ICD-10-CM | POA: Diagnosis not present

## 2021-01-26 ENCOUNTER — Encounter: Payer: Self-pay | Admitting: Nurse Practitioner

## 2021-01-29 ENCOUNTER — Encounter: Payer: Self-pay | Admitting: Nurse Practitioner

## 2021-01-29 ENCOUNTER — Encounter: Payer: Self-pay | Admitting: Ophthalmology

## 2021-01-29 ENCOUNTER — Ambulatory Visit (INDEPENDENT_AMBULATORY_CARE_PROVIDER_SITE_OTHER): Payer: Medicare Other | Admitting: Nurse Practitioner

## 2021-01-29 ENCOUNTER — Other Ambulatory Visit: Payer: Self-pay

## 2021-01-29 VITALS — BP 132/74 | HR 76 | Temp 98.1°F | Wt 172.6 lb

## 2021-01-29 DIAGNOSIS — E1140 Type 2 diabetes mellitus with diabetic neuropathy, unspecified: Secondary | ICD-10-CM | POA: Diagnosis not present

## 2021-01-29 DIAGNOSIS — Z23 Encounter for immunization: Secondary | ICD-10-CM | POA: Diagnosis not present

## 2021-01-29 DIAGNOSIS — E1169 Type 2 diabetes mellitus with other specified complication: Secondary | ICD-10-CM | POA: Diagnosis not present

## 2021-01-29 DIAGNOSIS — E1165 Type 2 diabetes mellitus with hyperglycemia: Secondary | ICD-10-CM | POA: Diagnosis not present

## 2021-01-29 DIAGNOSIS — B0229 Other postherpetic nervous system involvement: Secondary | ICD-10-CM | POA: Diagnosis not present

## 2021-01-29 DIAGNOSIS — E785 Hyperlipidemia, unspecified: Secondary | ICD-10-CM

## 2021-01-29 DIAGNOSIS — I5032 Chronic diastolic (congestive) heart failure: Secondary | ICD-10-CM

## 2021-01-29 DIAGNOSIS — N2581 Secondary hyperparathyroidism of renal origin: Secondary | ICD-10-CM | POA: Diagnosis not present

## 2021-01-29 DIAGNOSIS — I130 Hypertensive heart and chronic kidney disease with heart failure and stage 1 through stage 4 chronic kidney disease, or unspecified chronic kidney disease: Secondary | ICD-10-CM

## 2021-01-29 DIAGNOSIS — N184 Chronic kidney disease, stage 4 (severe): Secondary | ICD-10-CM | POA: Diagnosis not present

## 2021-01-29 DIAGNOSIS — Z794 Long term (current) use of insulin: Secondary | ICD-10-CM | POA: Diagnosis not present

## 2021-01-29 DIAGNOSIS — D631 Anemia in chronic kidney disease: Secondary | ICD-10-CM

## 2021-01-29 LAB — MICROALBUMIN, URINE WAIVED
Creatinine, Urine Waived: 100 mg/dL (ref 10–300)
Microalb, Ur Waived: 150 mg/L — ABNORMAL HIGH (ref 0–19)
Microalb/Creat Ratio: >300 mg/g — ABNORMAL HIGH (ref <30)

## 2021-01-29 LAB — BAYER DCA HB A1C WAIVED: HB A1C (BAYER DCA - WAIVED): 8.2 % — ABNORMAL HIGH (ref <7.0)

## 2021-01-29 NOTE — Assessment & Plan Note (Signed)
Chronic, ongoing.  Continue Rosuvastatin. Continue to collaborate with [facility].   Lipid panel today. 

## 2021-01-29 NOTE — Assessment & Plan Note (Signed)
Chronic with poor tolerance to Gabapentin and Lyrica and minimal relief with simple treatment methods.  Currently on Cymbalta and followed by pain management, continue this collaboration.

## 2021-01-29 NOTE — Assessment & Plan Note (Signed)
Chronic, ongoing.  Continue current medication regimen and collaboration with nephrology.  BMP today.

## 2021-01-29 NOTE — Assessment & Plan Note (Signed)
Chronic, ongoing with recent EF 60-65%, euvolemic at this time.  Continue current heart medication regimen and collaboration with cardiology + CCM team in office.  She needs lots of education on HF, needs to have diet reiterated each visit. - Reminded to call for an overnight weight gain of >2 pounds or a weekly weight weight of >5 pounds -- work with CCM team on obtaining scale. - not adding salt to his food and has been reading food labels. Reviewed the importance of keeping daily sodium intake to 2000mg  daily  - Wear compression hose daily -- not on today and not consistently wearing

## 2021-01-29 NOTE — Assessment & Plan Note (Addendum)
Chronic, ongoing.  Initial BP elevated, but repeat at goal range for age -- occasional elevations at home.  Continue current medication regimen and collaboration with nephrology.  BMP and TSH today.  Recommend she continue to monitor BP at home daily and document for providers + focus on DASH diet.   Return in 6 months.

## 2021-01-29 NOTE — Assessment & Plan Note (Signed)
Chronic with CKD.  Continue iron supplement daily and collaboration with hematology and nephrology teams.

## 2021-01-29 NOTE — Assessment & Plan Note (Signed)
Chronic, ongoing, followed by endocrinology.  A1C today 8.2%. Continue current medication regimen and defer changes to endo at upcoming visit due to ongoing lows (27%) over past month.  Cardiology and PCP have recommended to endo discontinuing Actos due to her HF.  Continue to collaborate with Dr. [name] and CCM team.  Recommend she monitor BS at least 3 times a day at home, continue consistent [place] use.  Return in 6 months as is being followed closely by endo.

## 2021-01-29 NOTE — Assessment & Plan Note (Signed)
Chronic, ongoing, followed by nephrology with recent labs obtained.  Continue collaboration with nephrology.   Recent note reviewed.

## 2021-01-29 NOTE — Assessment & Plan Note (Signed)
Chronic, ongoing.  Monitor closely due to AM low BS.  Continue collaboration with endocrinology. 

## 2021-01-29 NOTE — Patient Instructions (Signed)
Diabetes Mellitus and Nutrition, Adult When you have diabetes, or diabetes mellitus, it is very important to have healthy eating habits because your blood sugar (glucose) levels are greatly affected by what you eat and drink. Eating healthy foods in the right amounts, at about the same times every day, can help you:  Control your blood glucose.  Lower your risk of heart disease.  Improve your blood pressure.  Reach or maintain a healthy weight. What can affect my meal plan? Every person with diabetes is different, and each person has different needs for a meal plan. Your health care provider may recommend that you work with a dietitian to make a meal plan that is best for you. Your meal plan may vary depending on factors such as:  The calories you need.  The medicines you take.  Your weight.  Your blood glucose, blood pressure, and cholesterol levels.  Your activity level.  Other health conditions you have, such as heart or kidney disease. How do carbohydrates affect me? Carbohydrates, also called carbs, affect your blood glucose level more than any other type of food. Eating carbs naturally raises the amount of glucose in your blood. Carb counting is a method for keeping track of how many carbs you eat. Counting carbs is important to keep your blood glucose at a healthy level, especially if you use insulin or take certain oral diabetes medicines. It is important to know how many carbs you can safely have in each meal. This is different for every person. Your dietitian can help you calculate how many carbs you should have at each meal and for each snack. How does alcohol affect me? Alcohol can cause a sudden decrease in blood glucose (hypoglycemia), especially if you use insulin or take certain oral diabetes medicines. Hypoglycemia can be a life-threatening condition. Symptoms of hypoglycemia, such as sleepiness, dizziness, and confusion, are similar to symptoms of having too much  alcohol.  Do not drink alcohol if: ? Your health care provider tells you not to drink. ? You are pregnant, may be pregnant, or are planning to become pregnant.  If you drink alcohol: ? Do not drink on an empty stomach. ? Limit how much you use to:  0-1 drink a day for women.  0-2 drinks a day for men. ? Be aware of how much alcohol is in your drink. In the [place] one drink equals one 12 oz bottle of beer (355 mL), one 5 oz glass of wine (148 mL), or one 1 oz glass of hard liquor (44 mL). ? Keep yourself hydrated with water, diet soda, or unsweetened iced tea.  Keep in mind that regular soda, juice, and other mixers may contain a lot of sugar and must be counted as carbs. What are tips for following this plan? Reading food labels  Start by checking the serving size on the "Nutrition Facts" label of packaged foods and drinks. The amount of calories, carbs, fats, and other nutrients listed on the label is based on one serving of the item. Many items contain more than one serving per package.  Check the total grams (g) of carbs in one serving. You can calculate the number of servings of carbs in one serving by dividing the total carbs by 15. For example, if a food has 30 g of total carbs per serving, it would be equal to 2 servings of carbs.  Check the number of grams (g) of saturated fats and trans fats in one serving. Choose foods that have   a low amount or none of these fats.  Check the number of milligrams (mg) of salt (sodium) in one serving. Most people should limit total sodium intake to less than 2,300 mg per day.  Always check the nutrition information of foods labeled as "low-fat" or "nonfat." These foods may be higher in added sugar or refined carbs and should be avoided.  Talk to your dietitian to identify your daily goals for nutrients listed on the label. Shopping  Avoid buying canned, pre-made, or processed foods. These foods tend to be high in fat, sodium, and added  sugar.  Shop around the outside edge of the grocery store. This is where you will most often find fresh fruits and vegetables, bulk grains, fresh meats, and fresh dairy. Cooking  Use low-heat cooking methods, such as baking, instead of high-heat cooking methods like deep frying.  Cook using healthy oils, such as olive, canola, or sunflower oil.  Avoid cooking with butter, cream, or high-fat meats. Meal planning  Eat meals and snacks regularly, preferably at the same times every day. Avoid going long periods of time without eating.  Eat foods that are high in fiber, such as fresh fruits, vegetables, beans, and whole grains. Talk with your dietitian about how many servings of carbs you can eat at each meal.  Eat 4-6 oz (112-168 g) of lean protein each day, such as lean meat, chicken, fish, eggs, or tofu. One ounce (oz) of lean protein is equal to: ? 1 oz (28 g) of meat, chicken, or fish. ? 1 egg. ?  cup (62 g) of tofu.  Eat some foods each day that contain healthy fats, such as avocado, nuts, seeds, and fish.   What foods should I eat? Fruits Berries. Apples. Oranges. Peaches. Apricots. Plums. Grapes. Mango. Papaya. Pomegranate. Kiwi. Cherries. Vegetables Lettuce. Spinach. Leafy greens, including kale, chard, collard greens, and mustard greens. Beets. Cauliflower. Cabbage. Broccoli. Carrots. Green beans. Tomatoes. Peppers. Onions. Cucumbers. Brussels sprouts. Grains Whole grains, such as whole-wheat or whole-grain bread, crackers, tortillas, cereal, and pasta. Unsweetened oatmeal. Quinoa. Brown or wild rice. Meats and other proteins Seafood. Poultry without skin. Lean cuts of poultry and beef. Tofu. Nuts. Seeds. Dairy Low-fat or fat-free dairy products such as milk, yogurt, and cheese. The items listed above may not be a complete list of foods and beverages you can eat. Contact a dietitian for more information. What foods should I avoid? Fruits Fruits canned with  syrup. Vegetables Canned vegetables. Frozen vegetables with butter or cream sauce. Grains Refined white flour and flour products such as bread, pasta, snack foods, and cereals. Avoid all processed foods. Meats and other proteins Fatty cuts of meat. Poultry with skin. Breaded or fried meats. Processed meat. Avoid saturated fats. Dairy Full-fat yogurt, cheese, or milk. Beverages Sweetened drinks, such as soda or iced tea. The items listed above may not be a complete list of foods and beverages you should avoid. Contact a dietitian for more information. Questions to ask a health care provider  Do I need to meet with a diabetes educator?  Do I need to meet with a dietitian?  What number can I call if I have questions?  When are the best times to check my blood glucose? Where to find more information:  American Diabetes Association: diabetes.org  Academy of Nutrition and Dietetics: www.eatright.org  National Institute of Diabetes and Digestive and Kidney Diseases: www.niddk.nih.gov  Association of Diabetes Care and Education Specialists: www.diabeteseducator.org Summary  It is important to have healthy eating   habits because your blood sugar (glucose) levels are greatly affected by what you eat and drink.  A healthy meal plan will help you control your blood glucose and maintain a healthy lifestyle.  Your health care provider may recommend that you work with a dietitian to make a meal plan that is best for you.  Keep in mind that carbohydrates (carbs) and alcohol have immediate effects on your blood glucose levels. It is important to count carbs and to use alcohol carefully. This information is not intended to replace advice given to you by your health care provider. Make sure you discuss any questions you have with your health care provider. Document Revised: 11/08/2019 Document Reviewed: 11/08/2019 Elsevier Patient Education  2021 Elsevier Inc.  

## 2021-01-29 NOTE — Progress Notes (Signed)
BP 132/74 (BP Location: Left Arm)   Pulse 76   Temp 98.1 F (36.7 C) (Oral)   Wt 172 lb 9.6 oz (78.3 kg)   LMP  (LMP Unknown)   SpO2 99%   BMI 27.03 kg/m    Subjective:    Patient ID: Tonya [name], female    DOB: 04-05-1946, 75 y.o.   MRN: [id number]  HPI: Tonya [name] is a 75 y.o. female  Chief Complaint  Patient presents with  . Follow-up    Pt states she is currently doing well, no problems or concerns.    DIABETES Last saw Dr. [name] 11/02/20 with A1C 7.9%.  Cardiology and PCP have recommended to endo discontinuation of Actos due to her HF, she reports this medication continues.  Does endorse she has been eating differently and focused more on diet.  Previously has tried Toujeo and Trulicity -- did not tolerate.  Freestyle -- has one -- but without at this time until 22nd due to insurance.  Average glucose over past 90 days has been 153, have been above 46% of the time, at goal 27%, and low 27% of time.  Polydipsia/polyuria:no Visual disturbance:no Chest pain:no Paresthesias:no Glucose Monitoring:yes Accucheck frequency: TID -- has had 35 low events on Freestyle over past 90 days Fasting glucose:  Post prandial: Evening: as above Before meals:              Taking Insulin?:yes Long acting insulin: Levemir 6 units in morning Short acting insulin: Humalog 7 units  Blood Pressure Monitoring:a few times a week Retinal Examination:Up to Date -- 06/01/20 Foot Exam:Up to Date Pneumovax:Up to Date Influenza:Up to Date Aspirin:yes  HYPERTENSION / HYPERLIPIDEMIA/HF Followed by cardiology, Dr. [name] Situ.  Continues on Carvedilol, Lasix, and Crestor. Last seen by cardiology on 01/05/20.  Her LVEF on 12/02/2019 was 60 to 65% with moderate LVH, and grade 1 diastolic dysfunction.  Discussed use of compression hose with her and she reports not using. Weighing self every day and denies  increases. Satisfied with current treatment?yes Duration of hypertension:chronic BP monitoring frequency:a few times a week BP range:130-150/80-90's BP medication side effects:no Duration of hyperlipidemia:chronic Cholesterol medication side effects:no Cholesterol supplements: none Medication compliance:good compliance Aspirin:yes Recent stressors:no Recurrent headaches:no Visual changes:no Palpitations:no Dyspnea:no Chest pain:no Lower extremity edema:at baseline, small amount Dizzy/lightheaded:no  CHRONIC KIDNEY DISEASE Followed by nephrology and last seen 12/24/20. Has underlying CKD with 10/04/20 labs showing CRT 2.43 and GFR 22, PTH 79, BUN 79 with nephrology.  Is followed by hematology for chronic disease anemia and last seen 12/27/2019.  CKD status: stable Medications renally dose: yes Previous renal evaluation: yes Pneumovax:  Up to Date Influenza Vaccine:  Up to Date   POSTHERPETIC NEURALGIA: Followed by pain management and last seen by Dr. Holley Raring on 06/27/20 for injection.  Had shingles years ago and since that time has had ongoing issues with pain to lower right back. She is intolerant to Gabapentin and had issues with Pregabalin (change in mental status and hospital admission) + has tried Lidocaine patches with no benefit.  Has history of imaging to lumbar spine in 2016, where her pain is, and it showed "diffuse multilevel degenerative changes lumbar spine". Continues on Duloxetine.  Relevant past medical, surgical, family and social history reviewed and updated as indicated. Interim medical history since our last visit reviewed. Allergies and medications reviewed and updated.  Review of Systems  Constitutional: Negative for activity change, appetite change, diaphoresis, fatigue and fever.  Respiratory: Negative for cough, chest  tightness and shortness of breath.   Cardiovascular: Negative for chest pain, palpitations and leg swelling.   Gastrointestinal: Negative for abdominal distention, abdominal pain, constipation, diarrhea, nausea and vomiting.  Endocrine: Negative for cold intolerance, heat intolerance, polydipsia, polyphagia and polyuria.  Musculoskeletal: Positive for back pain.  Neurological: Negative for dizziness, syncope, weakness, light-headedness, numbness and headaches.  Psychiatric/Behavioral: Negative.     Per HPI unless specifically indicated above     Objective:    BP 132/74 (BP Location: Left Arm)   Pulse 76   Temp 98.1 F (36.7 C) (Oral)   Wt 172 lb 9.6 oz (78.3 kg)   LMP  (LMP Unknown)   SpO2 99%   BMI 27.03 kg/m   Wt Readings from Last 3 Encounters:  01/29/21 172 lb 9.6 oz (78.3 kg)  10/25/20 174 lb 3.2 oz (79 kg)  10/17/20 169 lb (76.7 kg)    Physical Exam Vitals and nursing note reviewed.  Constitutional:      General: She is awake. She is not in acute distress.    Appearance: She is well-developed. She is not ill-appearing.  HENT:     Head: Normocephalic.     Right Ear: Hearing normal.     Left Ear: Hearing normal.  Eyes:     General: Lids are normal.        Right eye: No discharge.        Left eye: No discharge.     Conjunctiva/sclera: Conjunctivae normal.     Pupils: Pupils are equal, round, and reactive to light.  Neck:     Thyroid: No thyromegaly.     Vascular: No carotid bruit.  Cardiovascular:     Rate and Rhythm: Normal rate and regular rhythm.     Heart sounds: Normal heart sounds. No murmur. No gallop.   Pulmonary:     Effort: Pulmonary effort is normal. No accessory muscle usage or respiratory distress.     Breath sounds: Normal breath sounds.  Abdominal:     General: Bowel sounds are normal.     Palpations: Abdomen is soft.  Musculoskeletal:     Cervical back: Normal range of motion and neck supple.     Right lower leg: 1+ present.     Left lower leg: 1+ present.  Skin:    General: Skin is warm and dry.     Findings: No rash.  Neurological:     Mental  Status: She is alert and oriented to person, place, and time.  Psychiatric:        Attention and Perception: Attention normal.        Mood and Affect: Mood normal.        Behavior: Behavior normal. Behavior is cooperative.        Thought Content: Thought content normal.        Judgment: Judgment normal.   Results for orders placed or performed in visit on 10/25/20  Bayer DCA Hb A1c Waived  Result Value Ref Range   HB A1C (BAYER DCA - WAIVED) 7.9 (H) <4.5 %  Basic metabolic panel  Result Value Ref Range   Glucose 109 (H) 65 - 99 mg/dL   BUN 54 (H) 8 - 27 mg/dL   Creatinine, Ser 2.56 (H) 0.57 - 1.00 mg/dL   GFR calc non Af Amer 18 (L) >59 mL/min/1.73   GFR calc Af Amer 21 (L) >59 mL/min/1.73   BUN/Creatinine Ratio 21 12 - 28   Sodium 145 (H) 134 - 144 mmol/L  Potassium 4.6 3.5 - 5.2 mmol/L   Chloride 110 (H) 96 - 106 mmol/L   CO2 21 20 - 29 mmol/L   Calcium 9.2 8.7 - 10.3 mg/dL  Lipid Panel w/o Chol/HDL Ratio  Result Value Ref Range   Cholesterol, Total 176 100 - 199 mg/dL   Triglycerides 86 0 - 149 mg/dL   HDL 73 >39 mg/dL   VLDL Cholesterol Cal 16 5 - 40 mg/dL   LDL Chol Calc (NIH) 87 0 - 99 mg/dL  TSH  Result Value Ref Range   TSH 1.920 0.450 - 4.500 uIU/mL      Assessment & Plan:   Problem List Items Addressed This Visit      Cardiovascular and Mediastinum   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  Initial BP elevated, but repeat at goal range for age -- occasional elevations at home.  Continue current medication regimen and collaboration with nephrology.  BMP and TSH today.  Recommend she continue to monitor BP at home daily and document for providers + focus on DASH diet.   Return in 6 months.      Relevant Orders   TSH   Chronic heart failure with preserved ejection fraction (HFpEF) (HCC)    Chronic, ongoing with recent EF 60-65%, euvolemic at this time.  Continue current heart medication regimen and collaboration with  cardiology + CCM team in office.  She needs lots of education on HF, needs to have diet reiterated each visit. - Reminded to call for an overnight weight gain of >2 pounds or a weekly weight weight of >5 pounds -- work with CCM team on obtaining scale. - not adding salt to his food and has been reading food labels. Reviewed the importance of keeping daily sodium intake to 2000mg  daily  - Wear compression hose daily -- not on today and not consistently wearing        Endocrine   Poorly controlled type 2 diabetes mellitus with neuropathy ([place]) - Primary    Chronic, ongoing, followed by endocrinology.  A1C today 8.2%. Continue current medication regimen and defer changes to endo at upcoming visit due to ongoing lows (27%) over past month.  Cardiology and PCP have recommended to endo discontinuing Actos due to her HF.  Continue to collaborate with Dr. [name] and CCM team.  Recommend she monitor BS at least 3 times a day at home, continue consistent [place] use.  Return in 6 months as is being followed closely by endo.      Relevant Orders   Bayer DCA Hb A1c Waived   Microalbumin, Urine Waived   Hyperlipidemia associated with type 2 diabetes mellitus (HCC)    Chronic, ongoing.  Continue Rosuvastatin. Continue to collaborate with [facility].  Lipid panel today.      Relevant Orders   Bayer DCA Hb A1c Waived   Lipid Panel w/o Chol/HDL Ratio   Hyperparathyroidism due to renal insufficiency (HCC)    Chronic, ongoing, followed by nephrology with recent labs obtained.  Continue collaboration with nephrology.   Recent note reviewed.        Nervous and Auditory   Post herpetic neuralgia    Chronic with poor tolerance to Gabapentin and Lyrica and minimal relief with simple treatment methods.  Currently on Cymbalta and followed by pain management, continue this collaboration.        Genitourinary   Chronic kidney disease, stage 4, severely decreased GFR (HCC)    Chronic,  ongoing.  Continue  current medication regimen and collaboration with nephrology.  BMP today.      Relevant Orders   Basic metabolic panel   Microalbumin, Urine Waived     Other   Encounter for long-term (current) use of insulin (HCC)    Chronic, ongoing.  Monitor closely due to AM low BS.  Continue collaboration with endocrinology.      Anemia in CKD (chronic kidney disease)    Chronic with CKD.  Continue iron supplement daily and collaboration with hematology and nephrology teams.       Other Visit Diagnoses    Flu vaccine need       Flu vaccine today   Relevant Orders   Flu Vaccine QUAD High Dose(Fluad) (Completed)      Follow up plan: Return in about 6 months (around 07/29/2021) for T2DM, HTN/HLD, CKD, NEUROPATHIC PAIN.

## 2021-01-30 LAB — BASIC METABOLIC PANEL
BUN/Creatinine Ratio: 22 (ref 12–28)
BUN: 57 mg/dL — ABNORMAL HIGH (ref 8–27)
CO2: 17 mmol/L — ABNORMAL LOW (ref 20–29)
Calcium: 8.9 mg/dL (ref 8.7–10.3)
Chloride: 108 mmol/L — ABNORMAL HIGH (ref 96–106)
Creatinine, Ser: 2.6 mg/dL — ABNORMAL HIGH (ref 0.57–1.00)
GFR calc Af Amer: 20 mL/min/{1.73_m2} — ABNORMAL LOW (ref >59)
GFR calc non Af Amer: 18 mL/min/{1.73_m2} — ABNORMAL LOW (ref >59)
Glucose: 168 mg/dL — ABNORMAL HIGH (ref 65–99)
Potassium: 4.8 mmol/L (ref 3.5–5.2)
Sodium: 145 mmol/L — ABNORMAL HIGH (ref 134–144)

## 2021-01-30 LAB — LIPID PANEL W/O CHOL/HDL RATIO
Cholesterol, Total: 183 mg/dL (ref 100–199)
HDL: 80 mg/dL (ref >39)
LDL Chol Calc (NIH): 88 mg/dL (ref 0–99)
Triglycerides: 81 mg/dL (ref 0–149)
VLDL Cholesterol Cal: 15 mg/dL (ref 5–40)

## 2021-01-30 LAB — TSH: TSH: 3.11 u[IU]/mL (ref 0.450–4.500)

## 2021-01-30 NOTE — Progress Notes (Signed)
Contacted via [place] afternoon Emina, your labs have returned and are staying at baseline.  Ensure you are getting plenty of water in and less salt, as sodium mildly elevated.  Kidney function continues to show kidney disease stage 4.  Ensure to follow-up with your kidney doctor as scheduled.  Thyroid is normal and cholesterol levels close to goal.  Any questions? Keep being awesome!!  Thank you for allowing me to participate in your care. Kindest regards, Zerah Hilyer

## 2021-02-01 ENCOUNTER — Other Ambulatory Visit: Payer: Self-pay

## 2021-02-01 ENCOUNTER — Other Ambulatory Visit
Admission: RE | Admit: 2021-02-01 | Discharge: 2021-02-01 | Disposition: A | Discharge status: Home / Self Care | Payer: Medicare Other | Source: Ambulatory Visit | Attending: Ophthalmology | Admitting: Ophthalmology

## 2021-02-01 DIAGNOSIS — Z20822 Contact with and (suspected) exposure to covid-19: Secondary | ICD-10-CM | POA: Insufficient documentation

## 2021-02-01 DIAGNOSIS — Z01812 Encounter for preprocedural laboratory examination: Secondary | ICD-10-CM | POA: Diagnosis not present

## 2021-02-01 LAB — SARS CORONAVIRUS 2 (TAT 6-24 HRS): SARS Coronavirus 2: NEGATIVE

## 2021-02-01 NOTE — Discharge Instructions (Signed)

## 2021-02-03 ENCOUNTER — Other Ambulatory Visit: Payer: Self-pay | Admitting: Cardiovascular Disease

## 2021-02-05 ENCOUNTER — Encounter
Admission: RE | Disposition: A | Discharge status: Home / Self Care | Payer: Self-pay | Source: Home / Self Care | Attending: Ophthalmology

## 2021-02-05 ENCOUNTER — Ambulatory Visit: Payer: Medicare Other | Admitting: Anesthesiology

## 2021-02-05 ENCOUNTER — Encounter: Payer: Self-pay | Admitting: Ophthalmology

## 2021-02-05 ENCOUNTER — Ambulatory Visit
Admission: RE | Admit: 2021-02-05 | Discharge: 2021-02-05 | Disposition: A | Discharge status: Home / Self Care | Payer: Medicare Other | Attending: Ophthalmology | Admitting: Ophthalmology

## 2021-02-05 ENCOUNTER — Other Ambulatory Visit: Payer: Self-pay

## 2021-02-05 DIAGNOSIS — I130 Hypertensive heart and chronic kidney disease with heart failure and stage 1 through stage 4 chronic kidney disease, or unspecified chronic kidney disease: Secondary | ICD-10-CM | POA: Insufficient documentation

## 2021-02-05 DIAGNOSIS — Z79899 Other long term (current) drug therapy: Secondary | ICD-10-CM | POA: Insufficient documentation

## 2021-02-05 DIAGNOSIS — Z8249 Family history of ischemic heart disease and other diseases of the circulatory system: Secondary | ICD-10-CM | POA: Diagnosis not present

## 2021-02-05 DIAGNOSIS — I5032 Chronic diastolic (congestive) heart failure: Secondary | ICD-10-CM | POA: Insufficient documentation

## 2021-02-05 DIAGNOSIS — Z889 Allergy status to unspecified drugs, medicaments and biological substances status: Secondary | ICD-10-CM | POA: Insufficient documentation

## 2021-02-05 DIAGNOSIS — E785 Hyperlipidemia, unspecified: Secondary | ICD-10-CM | POA: Diagnosis not present

## 2021-02-05 DIAGNOSIS — N183 Chronic kidney disease, stage 3 unspecified: Secondary | ICD-10-CM | POA: Insufficient documentation

## 2021-02-05 DIAGNOSIS — Z7982 Long term (current) use of aspirin: Secondary | ICD-10-CM | POA: Diagnosis not present

## 2021-02-05 DIAGNOSIS — Z833 Family history of diabetes mellitus: Secondary | ICD-10-CM | POA: Diagnosis not present

## 2021-02-05 DIAGNOSIS — Z8673 Personal history of transient ischemic attack (TIA), and cerebral infarction without residual deficits: Secondary | ICD-10-CM | POA: Insufficient documentation

## 2021-02-05 DIAGNOSIS — Z91041 Radiographic dye allergy status: Secondary | ICD-10-CM | POA: Insufficient documentation

## 2021-02-05 DIAGNOSIS — Z794 Long term (current) use of insulin: Secondary | ICD-10-CM | POA: Diagnosis not present

## 2021-02-05 DIAGNOSIS — Z7902 Long term (current) use of antithrombotics/antiplatelets: Secondary | ICD-10-CM | POA: Insufficient documentation

## 2021-02-05 DIAGNOSIS — E1136 Type 2 diabetes mellitus with diabetic cataract: Secondary | ICD-10-CM | POA: Diagnosis not present

## 2021-02-05 DIAGNOSIS — H2512 Age-related nuclear cataract, left eye: Secondary | ICD-10-CM | POA: Diagnosis not present

## 2021-02-05 DIAGNOSIS — E1122 Type 2 diabetes mellitus with diabetic chronic kidney disease: Secondary | ICD-10-CM | POA: Diagnosis not present

## 2021-02-05 DIAGNOSIS — H25812 Combined forms of age-related cataract, left eye: Secondary | ICD-10-CM | POA: Diagnosis not present

## 2021-02-05 HISTORY — DX: Presence of dental prosthetic device (complete) (partial): Z97.2

## 2021-02-05 HISTORY — PX: CATARACT EXTRACTION W/PHACO: SHX586

## 2021-02-05 LAB — GLUCOSE, CAPILLARY
Glucose-Capillary: 164 mg/dL — ABNORMAL HIGH (ref 70–99)
Glucose-Capillary: 164 mg/dL — ABNORMAL HIGH (ref 70–99)

## 2021-02-05 SURGERY — PHACOEMULSIFICATION, CATARACT, WITH IOL INSERTION
Anesthesia: Monitor Anesthesia Care | Site: Eye | Laterality: Left

## 2021-02-05 MED ORDER — MOXIFLOXACIN HCL 0.5 % OP SOLN
OPHTHALMIC | Status: DC | PRN
  Administered 2021-02-05: 0.2 mL via OPHTHALMIC

## 2021-02-05 MED ORDER — MIDAZOLAM HCL 2 MG/2ML IJ SOLN
INTRAMUSCULAR | Status: DC | PRN
  Administered 2021-02-05: 1 mg via INTRAVENOUS

## 2021-02-05 MED ORDER — FENTANYL CITRATE (PF) 100 MCG/2ML IJ SOLN
INTRAMUSCULAR | Status: DC | PRN
  Administered 2021-02-05: 50 ug via INTRAVENOUS

## 2021-02-05 MED ORDER — EPINEPHRINE PF 1 MG/ML IJ SOLN
INTRAOCULAR | Status: DC | PRN
  Administered 2021-02-05: 75 mL via OPHTHALMIC

## 2021-02-05 MED ORDER — LIDOCAINE HCL (PF) 2 % IJ SOLN
INTRAOCULAR | Status: DC | PRN
  Administered 2021-02-05: 1 mL

## 2021-02-05 MED ORDER — BRIMONIDINE TARTRATE-TIMOLOL 0.2-0.5 % OP SOLN
OPHTHALMIC | Status: DC | PRN
  Administered 2021-02-05: 1 [drp] via OPHTHALMIC

## 2021-02-05 MED ORDER — TETRACAINE HCL 0.5 % OP SOLN
1.0000 [drp] | OPHTHALMIC | Status: DC | PRN
  Administered 2021-02-05 (×3): 1 [drp] via OPHTHALMIC

## 2021-02-05 MED ORDER — LACTATED RINGERS IV SOLN: INTRAVENOUS | Status: DC

## 2021-02-05 MED ORDER — ARMC OPHTHALMIC DILATING DROPS
1.0000 "application " | OPHTHALMIC | Status: DC | PRN
  Administered 2021-02-05 (×3): 1 via OPHTHALMIC

## 2021-02-05 MED ORDER — NA CHONDROIT SULF-NA HYALURON 40-17 MG/ML IO SOLN
INTRAOCULAR | Status: DC | PRN
  Administered 2021-02-05: 1 mL via INTRAOCULAR

## 2021-02-05 SURGICAL SUPPLY — 19 items
CANNULA ANT/CHMB 27G (MISCELLANEOUS) ×2 IMPLANT
CANNULA ANT/CHMB 27GA (MISCELLANEOUS) ×4 IMPLANT
GLOVE SURG LX 8.0 MICRO (GLOVE) ×1
GLOVE SURG LX STRL 8.0 MICRO (GLOVE) ×1 IMPLANT
GLOVE SURG TRIUMPH 8.0 PF LTX (GLOVE) ×2 IMPLANT
GOWN STRL REUS W/ TWL LRG LVL3 (GOWN DISPOSABLE) ×2 IMPLANT
GOWN STRL REUS W/TWL LRG LVL3 (GOWN DISPOSABLE) ×4
LENS IOL TECNIS EYHANCE 21.0 (Intraocular Lens) ×1 IMPLANT
MARKER SKIN DUAL TIP RULER LAB (MISCELLANEOUS) ×2 IMPLANT
NDL FILTER BLUNT 18X1 1/2 (NEEDLE) ×1 IMPLANT
NEEDLE FILTER BLUNT 18X 1/2SAF (NEEDLE) ×1
NEEDLE FILTER BLUNT 18X1 1/2 (NEEDLE) ×1 IMPLANT
PACK EYE AFTER SURG (MISCELLANEOUS) ×2 IMPLANT
PACK OPTHALMIC (MISCELLANEOUS) ×2 IMPLANT
PACK PORFILIO (MISCELLANEOUS) ×2 IMPLANT
SYR 3ML LL SCALE MARK (SYRINGE) ×2 IMPLANT
SYR TB 1ML LUER SLIP (SYRINGE) ×2 IMPLANT
WATER STERILE IRR 250ML POUR (IV SOLUTION) ×2 IMPLANT
WIPE NON LINTING 3.25X3.25 (MISCELLANEOUS) ×2 IMPLANT

## 2021-02-05 NOTE — H&P (Signed)
[facility]   Primary Care Physician:  [name], NP Ophthalmologist: Dr. Benay Pillow  Pre-Procedure History & Physical: HPI:  Tonya [name] is a 75 y.o. female here for cataract surgery.   Past Medical History:  Diagnosis Date  . (HFpEF) heart failure with preserved ejection fraction ([place])    a. 05/2017 Echo: EF 55-60%, Gr1 DD, mildly dil LA.  . CKD (chronic kidney disease), stage III ([place])   . Diabetes mellitus without complication ([place])   . Hyperlipidemia   . Hypertension   . Stroke [facility])    a. 08/2017.  . Wears dentures    partial upper    Past Surgical History:  Procedure Laterality Date  . COLONOSCOPY  12/15/2006  . COLONOSCOPY WITH PROPOFOL N/A 06/06/2019   Procedure: COLONOSCOPY WITH PROPOFOL;  Surgeon: [name], MD;  Location: [facility] ENDOSCOPY;  Service: Gastroenterology;  Laterality: N/A;  . COLONOSCOPY WITH PROPOFOL N/A 01/16/2020   Procedure: COLONOSCOPY WITH BIOPSY;  Surgeon: [name], MD;  Location: [place];  Service: Endoscopy;  Laterality: N/A;  Diabetic - insulin and oral meds  . POLYPECTOMY N/A 01/16/2020   Procedure: POLYPECTOMY;  Surgeon: [name], MD;  Location: [place];  Service: Endoscopy;  Laterality: N/A;  . TUBAL LIGATION      Prior to Admission medications   Medication Sig Start Date End Date Taking? Authorizing Provider  aspirin 81 MG tablet Take 81 mg by mouth daily.   Yes [provider]  carvedilol (COREG) 3.125 MG tablet TAKE 1 TABLET BY MOUTH  TWICE DAILY WITH A MEAL 02/04/21  Yes Gollan, Kathlene November, MD  clopidogrel (PLAVIX) 75 MG tablet TAKE 1 TABLET BY MOUTH  DAILY 08/21/20  Yes Cannady, [name] T, NP  docusate sodium (COLACE) 100 MG capsule Take 100 mg by mouth daily as needed for mild constipation.   Yes [provider]  ferrous sulfate 325 (65 FE) MG EC tablet Take 1 tablet (325 mg total) by mouth 3 (three) times daily with meals. 09/27/19  Yes Earlie Server, MD  furosemide (LASIX) 40 MG  tablet TAKE 1 TABLET BY MOUTH EVERY DAY 01/14/21  Yes Cannady, [name] T, NP  HUMALOG KWIKPEN 100 UNIT/ML KiwkPen TK 12 UNITS BEFORE LUNCH AND 8 UNITS BEFORE SUPPER. 05/20/18  Yes [provider]  insulin detemir (LEVEMIR) 100 UNIT/ML injection Inject 10 Units into the skin every morning.    Yes [provider]  NOVOFINE 32G X 6 MM MISC USE AS DIRECTED THREE TIMES DAILY WITH HUMALOG 06/19/20  Yes Cannady, [name] T, NP  olmesartan (BENICAR) 40 MG tablet Take 1 tablet (40 mg total) by mouth daily. 08/07/20  Yes Cannady, [name] T, NP  omeprazole (PRILOSEC) 20 MG capsule TAKE 1 CAPSULE BY MOUTH  TWICE DAILY 08/21/20  Yes Cannady, [name] T, NP  pioglitazone (ACTOS) 30 MG tablet Take 30 mg by mouth daily. 10/11/19  Yes [provider]  rosuvastatin (CRESTOR) 5 MG tablet TAKE 1 TABLET BY MOUTH  DAILY 02/04/21  Yes Gollan, Kathlene November, MD  Vitamin D, Cholecalciferol, 50 MCG (2000 UT) CAPS Take 2,000 Units by mouth daily.   Yes [provider]    Allergies as of 12/06/2020 - Review Complete 10/25/2020  Allergen Reaction Noted  . Atorvastatin  09/03/2017  . Gabapentin Other (See Comments) 06/30/2018  . Pravastatin  09/03/2017  . Pregabalin  01/09/2020  . Trulicity [dulaglutide] Hives 05/01/2015    Family History  Problem Relation Age of Onset  . Diabetes Mother   .  Stroke Mother   . Hyperlipidemia Father   . Hypertension Father   . Diabetes Sister   . Diabetes Brother   . Gout Son   . Diabetes Brother   . Kidney disease Son   . Lymphoma Sister   . Heart attack Sister   . Breast cancer Neg Hx     Social History   Socioeconomic History  . Marital status: Married    Spouse name: Not on file  . Number of children: Not on file  . Years of education: Not on file  . Highest education level: GED or equivalent  Occupational History  . Occupation: retired  Tobacco Use  . Smoking status: Never Smoker  . Smokeless tobacco: Never Used  Vaping Use  . Vaping Use:  Never used  Substance and Sexual Activity  . Alcohol use: No    Alcohol/week: 0.0 standard drinks  . Drug use: No  . Sexual activity: Yes  Other Topics Concern  . Not on file  Social History Narrative  . Not on file   Social Determinants of Health   Financial Resource Strain: Low Risk   . Difficulty of Paying Living Expenses: Not hard at all  Food Insecurity: No Food Insecurity  . Worried About Charity fundraiser in the Last Year: Never true  . Ran Out of Food in the Last Year: Never true  Transportation Needs: No Transportation Needs  . Lack of Transportation (Medical): No  . Lack of Transportation (Non-Medical): No  Physical Activity: Inactive  . Days of Exercise per Week: 0 days  . Minutes of Exercise per Session: 0 min  Stress: No Stress Concern Present  . Feeling of Stress : Not at all  Social Connections: Socially Integrated  . Frequency of Communication with Friends and Family: More than three times a week  . Frequency of Social Gatherings with Friends and Family: More than three times a week  . Attends Religious Services: More than 4 times per year  . Active Member of Clubs or Organizations: Yes  . Attends Archivist Meetings: More than 4 times per year  . Marital Status: Married  Human resources officer Violence: Not At Risk  . Fear of Current or Ex-Partner: No  . Emotionally Abused: No  . Physically Abused: No  . Sexually Abused: No    Review of Systems: See HPI, otherwise negative ROS  Physical Exam: BP (!) 141/76   Pulse 87   Temp (!) 97.4 F (36.3 C) (Temporal)   Resp 16   Ht 5\' 7"  (1.702 m)   Wt 77.1 kg   LMP  (LMP Unknown)   SpO2 100%   BMI 26.63 kg/m  General:   Alert,  pleasant and cooperative in NAD Head:  Normocephalic and atraumatic. Respiratory:  Normal work of breathing.  Impression/Plan: Tonya [name] is here for cataract surgery.  Risks, benefits, limitations, and alternatives regarding cataract surgery have been reviewed  with the patient.  Questions have been answered.  All parties agreeable.   Birder Robson, MD  02/05/2021, 7:18 AM

## 2021-02-05 NOTE — Op Note (Signed)
PREOPERATIVE DIAGNOSIS:  Nuclear sclerotic cataract of the left eye.   POSTOPERATIVE DIAGNOSIS:  Nuclear sclerotic cataract of the left eye.   OPERATIVE PROCEDURE:@   SURGEON:  Birder Robson, MD.   ANESTHESIA:  Anesthesiologist: Ardeth Sportsman, MD CRNA: Cameron Ali, CRNA  1.      Managed anesthesia care. 2.     0.63ml of Shugarcaine was instilled following the paracentesis   COMPLICATIONS:  None.   TECHNIQUE:   Stop and chop   DESCRIPTION OF PROCEDURE:  The patient was examined and consented in the preoperative holding area where the aforementioned topical anesthesia was applied to the left eye and then brought back to the Operating Room where the left eye was prepped and draped in the usual sterile ophthalmic fashion and a lid speculum was placed. A paracentesis was created with the side port blade and the anterior chamber was filled with viscoelastic. A near clear corneal incision was performed with the steel keratome. A continuous curvilinear capsulorrhexis was performed with a cystotome followed by the capsulorrhexis forceps. Hydrodissection and hydrodelineation were carried out with BSS on a blunt cannula. The lens was removed in a stop and chop  technique and the remaining cortical material was removed with the irrigation-aspiration handpiece. The capsular bag was inflated with viscoelastic and the Technis [id number] lens was placed in the capsular bag without complication. The remaining viscoelastic was removed from the eye with the irrigation-aspiration handpiece. The wounds were hydrated. The anterior chamber was flushed with BSS and the eye was inflated to physiologic pressure. 0.53ml Vigamox was placed in the anterior chamber. The wounds were found to be water tight. The eye was dressed with Combigan. The patient was given protective glasses to wear throughout the day and a shield with which to sleep tonight. The patient was also given drops with which to begin a drop regimen today and will  follow-up with me in one day. Implant Name Type Inv. Item Serial No. Manufacturer Lot No. LRB No. Used Action  LENS IOL TECNIS EYHANCE 21.0 - [id number] Intraocular Lens LENS IOL TECNIS EYHANCE 21.0 [id number] JOHNSON   Left 1 Implanted    Procedure(s) with comments: CATARACT EXTRACTION PHACO AND INTRAOCULAR LENS PLACEMENT (IOC) LEFT DIABETIC 5.47 00:51.4 (Left) - Diabetic - insulin and oral meds  Electronically signed: Birder Robson 02/05/2021 7:51 AM

## 2021-02-05 NOTE — Anesthesia Postprocedure Evaluation (Signed)
Anesthesia Post Note  Patient: Tonya [name]  Procedure(s) Performed: CATARACT EXTRACTION PHACO AND INTRAOCULAR LENS PLACEMENT (IOC) LEFT DIABETIC 5.47 00:51.4 (Left Eye)     Patient location during evaluation: PACU Anesthesia Type: MAC Level of consciousness: awake and alert Pain management: pain level controlled Vital Signs Assessment: post-procedure vital signs reviewed and stable Respiratory status: nonlabored ventilation and spontaneous breathing Postop Assessment: no apparent nausea or vomiting Anesthetic complications: no   No complications documented.  Courtni Balash [employer]

## 2021-02-05 NOTE — Anesthesia Preprocedure Evaluation (Signed)
Anesthesia Evaluation  Patient identified by MRN, date of birth, ID band Patient awake    Reviewed: Allergy & Precautions, H&P , NPO status , Patient's Chart, lab work & pertinent test results  Airway Mallampati: III  TM Distance: >3 FB Neck ROM: full    Dental  (+) Upper Dentures   Pulmonary neg pulmonary ROS,    Pulmonary exam normal        Cardiovascular hypertension, +CHF (preserved EF)  Normal cardiovascular exam     Neuro/Psych CVA    GI/Hepatic negative GI ROS, Neg liver ROS,   Endo/Other  diabetes, Type 2  Renal/GU CRFRenal disease     Musculoskeletal  (+) Arthritis ,   Abdominal Normal abdominal exam  (+)   Peds  Hematology   Anesthesia Other Findings   Reproductive/Obstetrics                             Anesthesia Physical  Anesthesia Plan  ASA: III  Anesthesia Plan: MAC   Post-op Pain Management:    Induction: Intravenous  PONV Risk Score and Plan: 2 and Treatment may vary due to age or medical condition, TIVA and Midazolam  Airway Management Planned: Nasal Cannula and Natural Airway  Additional Equipment: None  Intra-op Plan:   Post-operative Plan:   Informed Consent: I have reviewed the patients History and Physical, chart, labs and discussed the procedure including the risks, benefits and alternatives for the proposed anesthesia with the patient or authorized representative who has indicated his/her understanding and acceptance.     Dental Advisory Given  Plan Discussed with: CRNA  Anesthesia Plan Comments:         Anesthesia Quick Evaluation

## 2021-02-05 NOTE — Anesthesia Procedure Notes (Signed)
Procedure Name: MAC Date/Time: 02/05/2021 7:30 AM Performed by: Cameron Ali, CRNA Pre-anesthesia Checklist: Patient identified, Emergency Drugs available, Suction available, Timeout performed and Patient being monitored Patient Re-evaluated:Patient Re-evaluated prior to induction Oxygen Delivery Method: Nasal cannula Placement Confirmation: positive ETCO2

## 2021-02-05 NOTE — Transfer of Care (Signed)
Immediate Anesthesia Transfer of Care Note  Patient: Tonya [name]  Procedure(s) Performed: CATARACT EXTRACTION PHACO AND INTRAOCULAR LENS PLACEMENT (IOC) LEFT DIABETIC 5.47 00:51.4 (Left Eye)  Patient Location: PACU  Anesthesia Type: MAC  Level of Consciousness: awake, alert  and patient cooperative  Airway and Oxygen Therapy: Patient Spontanous Breathing and Patient connected to supplemental oxygen  Post-op Assessment: Post-op Vital signs reviewed, Patient's Cardiovascular Status Stable, Respiratory Function Stable, Patent Airway and No signs of Nausea or vomiting  Post-op Vital Signs: Reviewed and stable  Complications: No complications documented.

## 2021-02-06 ENCOUNTER — Encounter: Payer: Self-pay | Admitting: Ophthalmology

## 2021-02-06 DIAGNOSIS — E1165 Type 2 diabetes mellitus with hyperglycemia: Secondary | ICD-10-CM | POA: Diagnosis not present

## 2021-02-06 DIAGNOSIS — Z794 Long term (current) use of insulin: Secondary | ICD-10-CM | POA: Diagnosis not present

## 2021-02-11 ENCOUNTER — Other Ambulatory Visit: Payer: Self-pay

## 2021-02-11 ENCOUNTER — Encounter: Payer: Self-pay | Admitting: Ophthalmology

## 2021-02-11 DIAGNOSIS — E1122 Type 2 diabetes mellitus with diabetic chronic kidney disease: Secondary | ICD-10-CM | POA: Diagnosis not present

## 2021-02-11 DIAGNOSIS — H2511 Age-related nuclear cataract, right eye: Secondary | ICD-10-CM | POA: Diagnosis not present

## 2021-02-11 DIAGNOSIS — Z794 Long term (current) use of insulin: Secondary | ICD-10-CM | POA: Diagnosis not present

## 2021-02-11 DIAGNOSIS — N1832 Chronic kidney disease, stage 3b: Secondary | ICD-10-CM | POA: Diagnosis not present

## 2021-02-11 DIAGNOSIS — E1121 Type 2 diabetes mellitus with diabetic nephropathy: Secondary | ICD-10-CM | POA: Diagnosis not present

## 2021-02-11 DIAGNOSIS — E113393 Type 2 diabetes mellitus with moderate nonproliferative diabetic retinopathy without macular edema, bilateral: Secondary | ICD-10-CM | POA: Diagnosis not present

## 2021-02-11 DIAGNOSIS — I10 Essential (primary) hypertension: Secondary | ICD-10-CM | POA: Diagnosis not present

## 2021-02-12 ENCOUNTER — Emergency Department
Admission: EM | Admit: 2021-02-12 | Discharge: 2021-02-12 | Disposition: A | Discharge status: Home / Self Care | Payer: Medicare Other | Attending: Emergency Medicine | Admitting: Emergency Medicine

## 2021-02-12 ENCOUNTER — Encounter: Payer: Self-pay | Admitting: Anesthesiology

## 2021-02-12 ENCOUNTER — Other Ambulatory Visit: Payer: Self-pay

## 2021-02-12 DIAGNOSIS — D631 Anemia in chronic kidney disease: Secondary | ICD-10-CM | POA: Diagnosis not present

## 2021-02-12 DIAGNOSIS — E785 Hyperlipidemia, unspecified: Secondary | ICD-10-CM | POA: Insufficient documentation

## 2021-02-12 DIAGNOSIS — I130 Hypertensive heart and chronic kidney disease with heart failure and stage 1 through stage 4 chronic kidney disease, or unspecified chronic kidney disease: Secondary | ICD-10-CM | POA: Insufficient documentation

## 2021-02-12 DIAGNOSIS — M5416 Radiculopathy, lumbar region: Secondary | ICD-10-CM | POA: Insufficient documentation

## 2021-02-12 DIAGNOSIS — Z7982 Long term (current) use of aspirin: Secondary | ICD-10-CM | POA: Diagnosis not present

## 2021-02-12 DIAGNOSIS — E1169 Type 2 diabetes mellitus with other specified complication: Secondary | ICD-10-CM | POA: Diagnosis not present

## 2021-02-12 DIAGNOSIS — N184 Chronic kidney disease, stage 4 (severe): Secondary | ICD-10-CM | POA: Diagnosis not present

## 2021-02-12 DIAGNOSIS — E1140 Type 2 diabetes mellitus with diabetic neuropathy, unspecified: Secondary | ICD-10-CM | POA: Insufficient documentation

## 2021-02-12 DIAGNOSIS — E1122 Type 2 diabetes mellitus with diabetic chronic kidney disease: Secondary | ICD-10-CM | POA: Insufficient documentation

## 2021-02-12 DIAGNOSIS — Z794 Long term (current) use of insulin: Secondary | ICD-10-CM | POA: Insufficient documentation

## 2021-02-12 DIAGNOSIS — I5032 Chronic diastolic (congestive) heart failure: Secondary | ICD-10-CM | POA: Diagnosis not present

## 2021-02-12 DIAGNOSIS — Z7902 Long term (current) use of antithrombotics/antiplatelets: Secondary | ICD-10-CM | POA: Insufficient documentation

## 2021-02-12 DIAGNOSIS — M545 Low back pain, unspecified: Secondary | ICD-10-CM | POA: Diagnosis present

## 2021-02-12 LAB — COMPREHENSIVE METABOLIC PANEL
ALT: 12 U/L (ref 0–44)
AST: 21 U/L (ref 15–41)
Albumin: 3.4 g/dL — ABNORMAL LOW (ref 3.5–5.0)
Alkaline Phosphatase: 62 U/L (ref 38–126)
Anion gap: 9 (ref 5–15)
BUN: 53 mg/dL — ABNORMAL HIGH (ref 8–23)
CO2: 21 mmol/L — ABNORMAL LOW (ref 22–32)
Calcium: 8.7 mg/dL — ABNORMAL LOW (ref 8.9–10.3)
Chloride: 109 mmol/L (ref 98–111)
Creatinine, Ser: 2.45 mg/dL — ABNORMAL HIGH (ref 0.44–1.00)
GFR, Estimated: 20 mL/min — ABNORMAL LOW (ref >60)
Glucose, Bld: 204 mg/dL — ABNORMAL HIGH (ref 70–99)
Potassium: 4.3 mmol/L (ref 3.5–5.1)
Sodium: 139 mmol/L (ref 135–145)
Total Bilirubin: 0.6 mg/dL (ref 0.3–1.2)
Total Protein: 6.6 g/dL (ref 6.5–8.1)

## 2021-02-12 LAB — CBC WITH DIFFERENTIAL/PLATELET
Abs Immature Granulocytes: 0.04 10*3/uL (ref 0.00–0.07)
Basophils Absolute: 0.1 10*3/uL (ref 0.0–0.1)
Basophils Relative: 1 %
Eosinophils Absolute: 0.3 10*3/uL (ref 0.0–0.5)
Eosinophils Relative: 5 %
HCT: 28.4 % — ABNORMAL LOW (ref 36.0–46.0)
Hemoglobin: 8.8 g/dL — ABNORMAL LOW (ref 12.0–15.0)
Immature Granulocytes: 1 %
Lymphocytes Relative: 22 %
Lymphs Abs: 1.4 10*3/uL (ref 0.7–4.0)
MCH: 26.7 pg (ref 26.0–34.0)
MCHC: 31 g/dL (ref 30.0–36.0)
MCV: 86.3 fL (ref 80.0–100.0)
Monocytes Absolute: 0.5 10*3/uL (ref 0.1–1.0)
Monocytes Relative: 8 %
Neutro Abs: 4.1 10*3/uL (ref 1.7–7.7)
Neutrophils Relative %: 63 %
Platelets: 277 10*3/uL (ref 150–400)
RBC: 3.29 MIL/uL — ABNORMAL LOW (ref 3.87–5.11)
RDW: 15.2 % (ref 11.5–15.5)
WBC: 6.5 10*3/uL (ref 4.0–10.5)
nRBC: 0 % (ref 0.0–0.2)

## 2021-02-12 LAB — URINALYSIS, COMPLETE (UACMP) WITH MICROSCOPIC
Bilirubin Urine: NEGATIVE
Glucose, UA: NEGATIVE mg/dL
Hgb urine dipstick: NEGATIVE
Ketones, ur: NEGATIVE mg/dL
Leukocytes,Ua: NEGATIVE
Nitrite: NEGATIVE
Protein, ur: 30 mg/dL — AB
Specific Gravity, Urine: 1.012 (ref 1.005–1.030)
pH: 5 (ref 5.0–8.0)

## 2021-02-12 MED ORDER — PREDNISONE 10 MG (21) PO TBPK: ORAL_TABLET | Freq: Every day | ORAL | 0 refills | Status: DC

## 2021-02-12 MED ORDER — ONDANSETRON 4 MG PO TBDP
4.0000 mg | ORAL_TABLET | Freq: Once | ORAL | Status: AC
  Administered 2021-02-12: 4 mg via ORAL
  Filled 2021-02-12: qty 1

## 2021-02-12 MED ORDER — HYDROCODONE-ACETAMINOPHEN 5-325 MG PO TABS
1.0000 | ORAL_TABLET | Freq: Once | ORAL | Status: AC
  Administered 2021-02-12: 1 via ORAL
  Filled 2021-02-12: qty 1

## 2021-02-12 MED ORDER — PREDNISONE 10 MG (21) PO TBPK: ORAL_TABLET | Freq: Every day | ORAL | 0 refills | Status: AC

## 2021-02-12 MED ORDER — METHOCARBAMOL 500 MG PO TABS: 500.0000 mg | ORAL_TABLET | Freq: Three times a day (TID) | ORAL | 0 refills | Status: AC | PRN

## 2021-02-12 MED ORDER — PREDNISONE 20 MG PO TABS
60.0000 mg | ORAL_TABLET | Freq: Once | ORAL | Status: AC
  Administered 2021-02-12: 60 mg via ORAL
  Filled 2021-02-12: qty 3

## 2021-02-12 NOTE — Discharge Instructions (Signed)
Please take tapered steroid as directed. Please only take 1 500 mg tablet of Robaxin before bed.  Robaxin is a muscle relaxer that can make you sleepy and increase your risk of falls.  Please do not drive a vehicle or operate heavy machinery with Robaxin in your system. Please make an appointment with Dr. Holley Raring to discuss injection options. Prednisone will temporarily increase your blood glucose levels.

## 2021-02-12 NOTE — ED Notes (Signed)
Pt c/o left lower back pain that radiates into the left leg since last night, states she doesn't recall anything that set it off. States she has a hx of right lower back pain in the past.

## 2021-02-12 NOTE — ED Provider Notes (Signed)
[facility]-EMERGENCY DEPARTMENT  ____________________________________________  Time seen: Approximately 4:59 PM  I have reviewed the triage vital signs and the nursing notes.   HISTORY  Chief Complaint Hip Pain (Radiating down left leg)   Historian Patient     HPI Tonya [name] is a 75 y.o. female with a history of hypertension and stage IV chronic kidney disease, presents to the emergency department with left-sided low back pain that radiates along the posterior aspect of the left leg. Patient states that she has experienced the symptoms in the past with the right lower extremity but not the left. She denies falls or mechanisms of trauma. Denies dysuria, hematuria or increased urinary frequency. No bowel or bladder incontinence or saddle anesthesia. Patient states that she has been able to ambulate with some pain.   Past Medical History:  Diagnosis Date  . (HFpEF) heart failure with preserved ejection fraction ([place])    a. 05/2017 Echo: EF 55-60%, Gr1 DD, mildly dil LA.  . CKD (chronic kidney disease), stage III ([place])   . Diabetes mellitus without complication ([place])   . Hyperlipidemia   . Hypertension   . Stroke [facility])    a. 08/2017.  . Wears dentures    partial upper     Immunizations up to date:  Yes.     Past Medical History:  Diagnosis Date  . (HFpEF) heart failure with preserved ejection fraction ([place])    a. 05/2017 Echo: EF 55-60%, Gr1 DD, mildly dil LA.  . CKD (chronic kidney disease), stage III ([place])   . Diabetes mellitus without complication ([place])   . Hyperlipidemia   . Hypertension   . Stroke [facility])    a. 08/2017.  . Wears dentures    partial upper    Patient Active Problem List   Diagnosis Date Noted  . Neuropathic pain 06/07/2020  . Lumbar degenerative disc disease 06/07/2020  . Lumbar facet arthropathy 06/07/2020  . Chronic pain syndrome 04/18/2020  . Sacroiliac joint pain 04/18/2020  . Chronic heart failure with preserved ejection fraction (HFpEF)  ([place]) 01/11/2020  . Grade I diastolic dysfunction 94/85/4627  . History of CVA (cerebrovascular accident) 12/01/2019  . Hyperparathyroidism due to renal insufficiency ([place]) 08/31/2019  . Anemia in CKD (chronic kidney disease) 02/22/2019  . Rotator cuff disorder 01/15/2018  . 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 Advanced care planning/counseling discussion 04/13/2017  . Post herpetic neuralgia 10/10/2016  . Hip bursitis 09/18/2015  . Poorly controlled type 2 diabetes mellitus with neuropathy ([place]) 05/01/2015  . Hyperlipidemia associated with type 2 diabetes mellitus ([place]) 05/01/2015  . Chronic kidney disease, stage 4, severely decreased GFR ([place]) 05/01/2015  . Encounter for long-term (current) use of insulin ([place]) 05/01/2015    Past Surgical History:  Procedure Laterality Date  . CATARACT EXTRACTION W/PHACO Left 02/05/2021   Procedure: CATARACT EXTRACTION PHACO AND INTRAOCULAR LENS PLACEMENT (IOC) LEFT DIABETIC 5.47 00:51.4;  Surgeon: Birder Robson, MD;  Location: [place];  Service: Ophthalmology;  Laterality: Left;  Diabetic - insulin and oral meds  . COLONOSCOPY  12/15/2006  . COLONOSCOPY WITH PROPOFOL N/A 06/06/2019   Procedure: COLONOSCOPY WITH PROPOFOL;  Surgeon: [name], MD;  Location: [facility] ENDOSCOPY;  Service: Gastroenterology;  Laterality: N/A;  . COLONOSCOPY WITH PROPOFOL N/A 01/16/2020   Procedure: COLONOSCOPY WITH BIOPSY;  Surgeon: [name], MD;  Location: [place];  Service: Endoscopy;  Laterality: N/A;  Diabetic - insulin and oral meds  . POLYPECTOMY N/A 01/16/2020  Procedure: POLYPECTOMY;  Surgeon: [name], MD;  Location: [place];  Service: Endoscopy;  Laterality: N/A;  . TUBAL LIGATION      Prior to Admission medications   Medication Sig Start Date End Date Taking? Authorizing Provider  methocarbamol (ROBAXIN) 500 MG tablet Take 1 tablet (500 mg total) by mouth every 8 (eight) hours as needed  for up to 5 days. 02/12/21 02/17/21 Yes Lannie Fields, PA-C  aspirin 81 MG tablet Take 81 mg by mouth daily.    [provider]  carvedilol (COREG) 3.125 MG tablet TAKE 1 TABLET BY MOUTH  TWICE DAILY WITH A MEAL 02/04/21   [name], MD  clopidogrel (PLAVIX) 75 MG tablet TAKE 1 TABLET BY MOUTH  DAILY 08/21/20   Cannady, Henrine Screws T, NP  docusate sodium (COLACE) 100 MG capsule Take 100 mg by mouth daily as needed for mild constipation.    [provider]  ferrous sulfate 325 (65 FE) MG EC tablet Take 1 tablet (325 mg total) by mouth 3 (three) times daily with meals. 09/27/19   Earlie Server, MD  furosemide (LASIX) 40 MG tablet TAKE 1 TABLET BY MOUTH EVERY DAY 01/14/21   Cannady, [name] T, NP  HUMALOG KWIKPEN 100 UNIT/ML KiwkPen TK 12 UNITS BEFORE LUNCH AND 8 UNITS BEFORE SUPPER. 05/20/18   [provider]  insulin detemir (LEVEMIR) 100 UNIT/ML injection Inject 10 Units into the skin every morning.     [provider]  NOVOFINE 32G X 6 MM MISC USE AS DIRECTED THREE TIMES DAILY WITH HUMALOG 06/19/20   Cannady, [name] T, NP  olmesartan (BENICAR) 40 MG tablet Take 1 tablet (40 mg total) by mouth daily. 08/07/20   Cannady, Henrine Screws T, NP  omeprazole (PRILOSEC) 20 MG capsule TAKE 1 CAPSULE BY MOUTH  TWICE DAILY 08/21/20   Cannady, [name] T, NP  pioglitazone (ACTOS) 30 MG tablet Take 30 mg by mouth daily. 10/11/19   [provider]  predniSONE (STERAPRED UNI-PAK 21 TAB) 10 MG (21) TBPK tablet Take by mouth daily for 6 days. 6,5,4,3,2,1 02/12/21 02/18/21  Vallarie Mare M, PA-C  rosuvastatin (CRESTOR) 5 MG tablet TAKE 1 TABLET BY MOUTH  DAILY 02/04/21   [name], MD  Vitamin D, Cholecalciferol, 50 MCG (2000 UT) CAPS Take 2,000 Units by mouth daily.    [provider]    Allergies Atorvastatin, Gabapentin, Pravastatin, Pregabalin, and Trulicity [dulaglutide]  Family History  Problem Relation Age of Onset  . Diabetes Mother   . Stroke Mother   . Hyperlipidemia  Father   . Hypertension Father   . Diabetes Sister   . Diabetes Brother   . Gout Son   . Diabetes Brother   . Kidney disease Son   . Lymphoma Sister   . Heart attack Sister   . Breast cancer Neg Hx     Social History Social History   Tobacco Use  . Smoking status: Never Smoker  . Smokeless tobacco: Never Used  Vaping Use  . Vaping Use: Never used  Substance Use Topics  . Alcohol use: No    Alcohol/week: 0.0 standard drinks  . Drug use: No     Review of Systems  Constitutional: No fever/chills Eyes:  No discharge ENT: No upper respiratory complaints. Respiratory: no cough. No SOB/ use of accessory muscles to breath Gastrointestinal:   No nausea, no vomiting.  No diarrhea.  No constipation. Musculoskeletal: Patient has low back pain that radiates down the left lower extremity. Skin: Negative  for rash, abrasions, lacerations, ecchymosis.    ____________________________________________   PHYSICAL EXAM:  VITAL SIGNS: ED Triage Vitals  Enc Vitals Group     BP 02/12/21 1632 112/63     Pulse Rate 02/12/21 1632 77     Resp 02/12/21 1632 17     Temp 02/12/21 1632 97.9 F (36.6 C)     Temp src --      SpO2 02/12/21 1632 100 %     Weight 02/12/21 1633 170 lb (77.1 kg)     Height 02/12/21 1633 5\' 7"  (1.702 m)     Head Circumference --      Peak Flow --      Pain Score 02/12/21 1633 3     Pain Loc --      Pain Edu? --      Excl. in [place]? --      Constitutional: Alert and oriented. Well appearing and in no acute distress. Eyes: Conjunctivae are normal. PERRL. EOMI. Head: Atraumatic. ENT:      Nose: No congestion/rhinnorhea.      Mouth/Throat: Mucous membranes are moist.  Neck: No stridor.  No cervical spine tenderness to palpation. Cardiovascular: Normal rate, regular rhythm. Normal S1 and S2.  Good peripheral circulation. Respiratory: Normal respiratory effort without tachypnea or retractions. Lungs CTAB. Good air entry to the bases with no decreased or absent  breath sounds Gastrointestinal: Bowel sounds x 4 quadrants. Soft and nontender to palpation. No guarding or rigidity. No distention. Musculoskeletal: Full range of motion to all extremities. No obvious deformities noted. Neurologic:  Normal for age. No gross focal neurologic deficits are appreciated.  Skin:  Skin is warm, dry and intact. No rash noted. Psychiatric: Mood and affect are normal for age. Speech and behavior are normal.   ____________________________________________   LABS (all labs ordered are listed, but only abnormal results are displayed)  Labs Reviewed  CBC WITH DIFFERENTIAL/PLATELET - Abnormal; Notable for the following components:      Result Value   RBC 3.29 (*)    Hemoglobin 8.8 (*)    HCT 28.4 (*)    All other components within normal limits  COMPREHENSIVE METABOLIC PANEL - Abnormal; Notable for the following components:   CO2 21 (*)    Glucose, Bld 204 (*)    BUN 53 (*)    Creatinine, Ser 2.45 (*)    Calcium 8.7 (*)    Albumin 3.4 (*)    GFR, Estimated 20 (*)    All other components within normal limits  URINALYSIS, COMPLETE (UACMP) WITH MICROSCOPIC - Abnormal; Notable for the following components:   Color, Urine YELLOW (*)    APPearance CLEAR (*)    Protein, ur 30 (*)    Bacteria, UA MANY (*)    All other components within normal limits   ____________________________________________  EKG   ____________________________________________  RADIOLOGY   No results found.  ____________________________________________    PROCEDURES  Procedure(s) performed:     Procedures     Medications - No data to display   ____________________________________________   INITIAL IMPRESSION / ASSESSMENT AND PLAN / ED COURSE  Pertinent labs & imaging results that were available during my care of the patient were reviewed by me and considered in my medical decision making (see chart for details).      Assessment and Plan:  Radicular back pain:   76 year old female presents to the emergency department with low back pain that radiates down the posterior aspect of the left lower extremity.  Vital signs are reassuring at triage.  On physical exam, patient seemed uncomfortable but can ambulate.  Basic labs are obtained.  Patient's current creatinine at 2.45 was slightly improved considering patient's last CBC.  Blood glucose level was 204.  We will start patient on a short tapered course of prednisone.  Advised patient that prednisone could temporarily increase her blood glucose levels.  I recommended reaching out to pain management physician Dr. Holley Raring as patient has been under his care in the past and received back injections.  I also gave patient a prescription for Robaxin and instructed patient in the discharge paperwork to only take medication at night as it could increase her risk of falls.  Return precautions were given to return with new or worsening symptoms.  All patient questions were answered.    ____________________________________________  FINAL CLINICAL IMPRESSION(S) / ED DIAGNOSES  Final diagnoses:  Lumbar radiculopathy      NEW MEDICATIONS STARTED DURING THIS VISIT:  ED Discharge Orders         Ordered    predniSONE (STERAPRED UNI-PAK 21 TAB) 10 MG (21) TBPK tablet  Daily,   Status:  Discontinued        02/12/21 1802    methocarbamol (ROBAXIN) 500 MG tablet  Every 8 hours PRN        02/12/21 1802    predniSONE (STERAPRED UNI-PAK 21 TAB) 10 MG (21) TBPK tablet  Daily,   Status:  Discontinued        02/12/21 1803    predniSONE (STERAPRED UNI-PAK 21 TAB) 10 MG (21) TBPK tablet  Daily,   Status:  Discontinued        02/12/21 1804    predniSONE (STERAPRED UNI-PAK 21 TAB) 10 MG (21) TBPK tablet  Daily        02/12/21 1805              This chart was dictated using voice recognition software/Dragon. Despite best efforts to proofread, errors can occur which can change the meaning. Any change was purely  unintentional.     Lannie Fields, PA-C 02/12/21 1813    Vanessa Gurley, MD 02/12/21 [id number]

## 2021-02-12 NOTE — ED Triage Notes (Signed)
Pt states left hip pain that started last night and is radiating down her leg. 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 Pt denies any known trauma.

## 2021-02-15 ENCOUNTER — Ambulatory Visit (INDEPENDENT_AMBULATORY_CARE_PROVIDER_SITE_OTHER): Payer: Medicare Other | Admitting: General Practice

## 2021-02-15 ENCOUNTER — Other Ambulatory Visit: Admission: RE | Admit: 2021-02-15 | Payer: Medicare Other | Source: Ambulatory Visit

## 2021-02-15 ENCOUNTER — Telehealth: Payer: Medicare Other | Admitting: General Practice

## 2021-02-15 DIAGNOSIS — E1165 Type 2 diabetes mellitus with hyperglycemia: Secondary | ICD-10-CM | POA: Diagnosis not present

## 2021-02-15 DIAGNOSIS — I5032 Chronic diastolic (congestive) heart failure: Secondary | ICD-10-CM | POA: Diagnosis not present

## 2021-02-15 DIAGNOSIS — I130 Hypertensive heart and chronic kidney disease with heart failure and stage 1 through stage 4 chronic kidney disease, or unspecified chronic kidney disease: Secondary | ICD-10-CM

## 2021-02-15 DIAGNOSIS — E1140 Type 2 diabetes mellitus with diabetic neuropathy, unspecified: Secondary | ICD-10-CM

## 2021-02-15 DIAGNOSIS — M5442 Lumbago with sciatica, left side: Secondary | ICD-10-CM

## 2021-02-15 DIAGNOSIS — G8929 Other chronic pain: Secondary | ICD-10-CM

## 2021-02-15 NOTE — Chronic Care Management (AMB) (Signed)
Chronic Care Management   CCM RN Visit Note  02/15/2021 Name: Tonya [name] MRN: [id number] DOB: [birth year]  Subjective: Tonya [name] is a 75 y.o. year old female who is a primary care patient of Cannady, [name], NP. The care management team was consulted for assistance with disease management and care coordination needs.    Engaged with patient by telephone for follow up visit in response to provider referral for case management and/or care coordination services.   Consent to Services:  The patient was given information about Chronic Care Management services, agreed to services, and gave verbal consent prior to initiation of services.  Please see initial visit note for detailed documentation.   Patient agreed to services and verbal consent obtained.   Assessment: Review of patient past medical history, allergies, medications, health status, including review of consultants reports, laboratory and other test data, was performed as part of comprehensive evaluation and provision of chronic care management services.   SDOH (Social Determinants of Health) assessments and interventions performed:    CCM Care Plan  Allergies  Allergen Reactions   Atorvastatin     Myalgias    Gabapentin Other (See Comments)    Speech impairment    Pravastatin     Myalgias    Pregabalin     Speech impairment   Trulicity [Dulaglutide] Hives    Outpatient Encounter Medications as of 02/15/2021  Medication Sig Note   aspirin 81 MG tablet Take 81 mg by mouth daily.    carvedilol (COREG) 3.125 MG tablet TAKE 1 TABLET BY MOUTH  TWICE DAILY WITH A MEAL    clopidogrel (PLAVIX) 75 MG tablet TAKE 1 TABLET BY MOUTH  DAILY    docusate sodium (COLACE) 100 MG capsule Take 100 mg by mouth daily as needed for mild constipation.    ferrous sulfate 325 (65 FE) MG EC tablet Take 1 tablet (325 mg total) by mouth 3 (three) times daily with meals. 01/11/2020: Taking twice daily    furosemide (LASIX) 40 MG  tablet TAKE 1 TABLET BY MOUTH EVERY DAY    HUMALOG KWIKPEN 100 UNIT/ML KiwkPen TK 12 UNITS BEFORE LUNCH AND 8 UNITS BEFORE SUPPER. 03/09/2020: 6 units   insulin detemir (LEVEMIR) 100 UNIT/ML injection Inject 10 Units into the skin every morning.     methocarbamol (ROBAXIN) 500 MG tablet Take 1 tablet (500 mg total) by mouth every 8 (eight) hours as needed for up to 5 days.    NOVOFINE 32G X 6 MM MISC USE AS DIRECTED THREE TIMES DAILY WITH HUMALOG    olmesartan (BENICAR) 40 MG tablet Take 1 tablet (40 mg total) by mouth daily.    omeprazole (PRILOSEC) 20 MG capsule TAKE 1 CAPSULE BY MOUTH  TWICE DAILY    pioglitazone (ACTOS) 30 MG tablet Take 30 mg by mouth daily.    predniSONE (STERAPRED UNI-PAK 21 TAB) 10 MG (21) TBPK tablet Take by mouth daily for 6 days. 6,5,4,3,2,1    rosuvastatin (CRESTOR) 5 MG tablet TAKE 1 TABLET BY MOUTH  DAILY    Vitamin D, Cholecalciferol, 50 MCG (2000 UT) CAPS Take 2,000 Units by mouth daily.    No facility-administered encounter medications on file as of 02/15/2021.    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[place]) 09/22/2017   Advanced care planning/counseling discussion 04/13/2017   Post herpetic neuralgia 10/10/2016   Hip bursitis 09/18/2015   Poorly controlled type 2 diabetes mellitus with neuropathy ([place]) 05/01/2015   Hyperlipidemia associated with type 2  diabetes mellitus ([place]) 05/01/2015   Chronic kidney disease, stage 4, severely decreased GFR ([place]) 05/01/2015   Encounter for long-term (current) use of insulin ([place]) 05/01/2015    Conditions to be addressed/monitored:CHF, HTN, DMII and Chronic pain in back radiating to left leg   Care Plan : RNCM: Heart Failure (Adult)  Updates made by Vanita Ingles since 02/15/2021 12:00 AM    Problem: RNCM: Symptom Exacerbation (Heart Failure)   Priority: Medium    Long-Range Goal: RNCM: Symptom Exacerbation Prevented or Minimized   Priority: Medium  Note:   Current Barriers:   Knowledge deficits related to basic heart failure pathophysiology and self care management  Lacks social connections  Does not contact provider office for questions/concerns  Lack of scale in home  Financial strain Nurse Case Manager Clinical Goal(s):   patient will weigh self daily and record  patient will verbalize understanding of Heart Failure Action Plan and when to call doctor  patient will take all Heart Failure mediations as prescribed Interventions:   Collaboration with [name], NP regarding development and update of comprehensive plan of care as evidenced by provider attestation and co-signature  Inter-disciplinary care team collaboration (see longitudinal plan of care)  Basic overview and discussion of pathophysiology of Heart Failure  Provided written and verbal education on low sodium diet  Reviewed Heart Failure Action Plan in depth and provided written copy  Assessed for scales in home  Discussed importance of daily weight  Reviewed role of diuretics in prevention of fluid overload Patient Goals/Self-Care Activities:  Takes Heart Failure Medications as prescribed Weighs daily and record (notifying MD of 3 lb weight gain over night or 5 lb in a week) Verbalizes understanding of and follows CHF Action Plan Adheres to low sodium diet  - Take Heart Failure Medications as  prescribed - Weigh daily and record (notify MD with 3 lb weight gain over night or 5 lb in a week) - Follow CHF Action Plan - Adhere to low sodium diet - barriers to lifestyle changes reviewed and addressed - barriers to treatment reviewed and addressed - cognitive screening completed and reviewed - depression screen reviewed - health literacy screening completed or reviewed - healthy lifestyle promoted - medication-adherence assessment completed - rescue (action) plan developed - rescue (action) plan reviewed - self-awareness of signs/symptoms of worsening disease encouraged Follow Up Plan: Telephone follow up appointment with care management team member scheduled for: 04-05-2021 at 1 pm   Task: RNCM: Identify and Minimize Risk of Heart Failure Exacerbation   Note:   Care Management Activities:    - barriers to lifestyle changes reviewed and addressed - barriers to treatment reviewed and addressed - cognitive screening completed and reviewed - depression screen reviewed - health literacy screening completed or reviewed - healthy lifestyle promoted - medication-adherence assessment completed - rescue (action) plan developed - rescue (action) plan reviewed - self-awareness of signs/symptoms of worsening disease encouraged  Care Plan : RNCM: Chronic Pain (Adult)  Updates made by Vanita Ingles since 02/15/2021 12:00 AM    Problem: RNCM: Pain Management Plan (Chronic Pain)   Priority: High  Onset Date: 02/12/2021    Goal: RNCM: Pain Management Plan Developed   Start Date: 02/12/2021  Priority: High  Note:   Current Barriers:   Knowledge Deficits related to managing acute/chronic pain  Non-adherence to scheduled provider appointments  Non-adherence to prescribed medication regimen  Difficulty obtaining medications  Chronic Disease Management support and education needs related to chronic pain  Unable to independently manage pain and discomfort to lower back radiating  down left leg  Lacks social connections  Unable to perform IADLs independently  Does not contact provider office for questions/concerns Nurse Case Manager Clinical Goal(s):   patient will verbalize understanding of plan for managing pain  patient will attend all scheduled medical appointments: 07-29-2021 with pcp, may need sooner appointment, specialist to evaluate pain on 03-05-2021  patient will demonstrate use of different relaxation  skills and/or diversional activities to assist with pain reduction (distraction, imagery, relaxation, massage, acupressure, TENS, heat, and cold application  patient will report pain at a level less than 3 to 4 on a 10-10 rating scale  patient will use pharmacological and nonpharmacological pain relief strategies  patient will verbalize acceptable level of pain relief and ability to engage in desired activities  patient will engage in desired activities without an increase in pain level Interventions:   Collaboration with [name], NP regarding development and update of comprehensive plan of care as evidenced by provider attestation and co-signature  Inter-disciplinary care team collaboration (see longitudinal plan of care)  - careful application of heat or ice encouraged  - deep breathing, relaxation and mindfulness use promoted  - effectiveness of pharmacologic therapy monitored  - medication-induced side effects managed  - misuse of pain medication assessed  - motivation and barriers to change assessed and addressed  - mutually acceptable comfort goal set  - pain assessed  - pain treatment goals reviewed  - premedication prior to activity encouraged  Evaluation of current treatment plan related to pain in back going down left leg and patient's adherence to plan as established by provider.  Advised patient to call the office for worsening sx/sx of pain or changes noted   Provided education to patient re: alternative pain  relief measures. The patient tried heat application last night but states it was not helpful. States the pain is going down into her left leg from her back.   Reviewed medications with patient and discussed compliance. The patient is taking a prednisone dose pack and Robaxin. She feels the Robaxin or the prednisone is not helping   Collaborated with pcp regarding the patient states she can not see the pain specialist until 03-05-2021, ask about getting a "shot", will discuss with pcp for recommendations   Discussed plans with patient for ongoing care management follow up and provided patient with direct contact information for care management team  Allow patient to maintain a diary of pain ratings, timing, precipitating events, medications, treatments, and what works best to relieve pain,   Refer to support groups and self-help groups  Educate patient about the use of pharmacological interventions for pain management- antianxiety, antidepressants, NSAIDS, opioid analgesics,   Explain the importance of lifestyle modifications to effective pain management  Patient Goals/Self Care Activities:     Self-administers medications as prescribed  Attends all scheduled provider appointments  Calls pharmacy for  medication refills  Calls provider office for new concerns or questions Follow Up Plan: Telephone follow up appointment with care management team member scheduled for: 04-05-2021 at 1 pm     Task: Partner to Develop Chronic Pain Management Plan  Care Plan : RNCM: Hypertension (Adult)  Updates made by Vanita Ingles since 02/15/2021 12:00 AM    Problem: RNCM: Hypertension (Hypertension)   Priority: Medium    Long-Range Goal: RNCM: Hypertension Monitored   Priority: Medium  Note:   Objective:   Last practice recorded BP readings:  BP Readings from Last 3 Encounters:  02/12/21 115/63  02/05/21 (!) 152/78  01/29/21 132/74     Most recent eGFR/CrCl: No results found for: EGFR  No  components found for: CRCL Current Barriers:   Knowledge Deficits related to basic understanding of hypertension pathophysiology and self care management  Knowledge Deficits related to understanding of medications prescribed for management of hypertension  Limited Social Support  Lacks social connections  Unable to perform IADLs independently  Does not contact provider office for questions/concerns Case Manager Clinical Goal(s):   Over the next 120 days, patient will verbalize understanding of plan for hypertension management  Over the next 120 days, patient will attend all scheduled medical appointments: 07-29-2021  Over the next 120 days, patient will demonstrate improved adherence to prescribed treatment plan for hypertension as evidenced by taking all medications as prescribed, monitoring and recording blood pressure as directed, adhering to low sodium/DASH diet  Over the next 120 days, patient will demonstrate improved health management independence as evidenced by checking blood pressure as directed and notifying PCP if SBP>160 or DBP > 90, taking all medications as prescribe, and adhering to a low sodium diet as discussed.  Over the next 120 days, patient will verbalize basic understanding of hypertension disease process and self health management plan as evidenced by compliance with medications, compliance with diet, and working with CCM team to manage health and well being  Interventions:   Collaboration with [name], NP regarding development and update of comprehensive plan of care as evidenced by provider attestation and co-signature  Inter-disciplinary care team collaboration (see longitudinal plan of care)  Evaluation of current treatment plan related to hypertension self management and patient's adherence to plan as established by provider.  Provided education to patient re: stroke prevention, s/s of heart attack and stroke, DASH diet, complications of  uncontrolled blood pressure  Reviewed medications with patient and discussed importance of compliance  Discussed plans with patient for ongoing care management follow up and provided patient with direct contact information for care management team  Advised patient, providing education and rationale, to monitor blood pressure daily and record, calling PCP for findings outside established parameters.   Reviewed scheduled/upcoming provider appointments including: 07-29-2021 Patient Goals: - blood pressure trends reviewed - depression screen reviewed - home or ambulatory blood pressure monitoring encouraged Self-Care Activities: - Self administers medications as prescribed Attends all scheduled provider appointments Calls provider office for new concerns, questions, or BP outside discussed parameters Checks BP and records as discussed Follows a low sodium diet/DASH diet Follow Up Plan: Telephone follow up appointment with care management team member scheduled for:  04-05-2021 at 1 pm   Task: RNCM: Identify and Monitor Blood Pressure Elevation   Note:   Care Management Activities:    - blood pressure trends reviewed - depression screen reviewed - home or ambulatory blood pressure monitoring encouraged       Care Plan : RNCM: Diabetes  Type 2 (Adult)  Updates made by Vanita Ingles since 02/15/2021 12:00 AM    Problem: RNCM: Glycemic Management (Diabetes, Type 2)   Priority: High    Long-Range Goal: RNCM: Glycemic Management Optimized   Priority: High  Note:   Objective:  Lab Results  Component Value Date   HGBA1C 8.2 (H) 01/29/2021    Lab Results  Component Value Date   CREATININE 2.45 (H) 02/12/2021   CREATININE 2.60 (H) 01/29/2021   CREATININE 2.56 (H) 10/25/2020     No results found for: EGFR Current Barriers:   Knowledge Deficits related to medications used for management of diabetes  Does not use cbg meter   Limited Social Support  Unable to independently  manage blood sugars  Unable to self administer medications as prescribed  Does not adhere to provider recommendations re: checking blood sugars as recommended by the provider   Does not adhere to prescribed medication regimen  Lacks social connections Case Manager Clinical Goal(s):   patient will demonstrate improved adherence to prescribed treatment plan for diabetes self care/management as evidenced by: daily monitoring and recording of CBG  adherence to ADA/ carb modified diet exercise 3/4 days/week adherence to prescribed medication regimen contacting provider for new or worsened symptoms or questions Interventions:   Collaboration with [name], NP regarding development and update of comprehensive plan of care as evidenced by provider attestation and co-signature  Inter-disciplinary care team collaboration (see longitudinal plan of care)  Provided education to patient about basic DM disease process  Reviewed medications with patient and discussed importance of medication adherence  Discussed plans with patient for ongoing care management follow up and provided patient with direct contact information for care management team  Provided patient with written educational materials related to hypo and hyperglycemia and importance of correct treatment  Reviewed scheduled/upcoming provider appointments including: 04-05-2021 at 1 pm  Advised patient, providing education and rationale, to check cbg bid and record, calling pcp for findings outside established parameters.    Review of patient status, including review of consultants reports, relevant laboratory and other test results, and medications completed. Self-Care Activities - UNABLE to independently manage DM Self administers oral medications as prescribed Self administers insulin as prescribed Attends all scheduled provider appointments Checks blood sugars as prescribed and utilize hyper and hypoglycemia protocol as  needed Adheres to prescribed ADA/carb modified Patient Goals: -- barriers to adherence to treatment plan identified - blood glucose monitoring encouraged - blood glucose readings reviewed - encourage participation in diabetes education classes - individualized medical nutrition therapy provided - resources required to improve adherence to care identified - self-awareness of signs/symptoms of hypo or hyperglycemia encouraged - use of blood glucose monitoring log promoted Follow Up Plan: Telephone follow up appointment with care management team member scheduled for: 04-05-2021 at 1 pm   Task: RNCM: Alleviate Barriers to Glycemic Management   Note:   Care Management Activities:    - barriers to adherence to treatment plan identified - blood glucose monitoring encouraged - blood glucose readings reviewed - encourage participation in diabetes education classes - individualized medical nutrition therapy provided - resources required to improve adherence to care identified - self-awareness of signs/symptoms of hypo or hyperglycemia encouraged - use of blood glucose monitoring log promoted         Plan:Telephone follow up appointment with care management team member scheduled for:  04-05-2021 at 1 pm  [place], MSN, [place]  [place] Mobile: [phone]

## 2021-02-15 NOTE — Patient Instructions (Signed)
Visit Information  PATIENT GOALS: Goals Addressed            This Visit's Progress   . COMPLETED: RNCM: I have a lot of health problems but I am doing okay       Current Barriers: Closing this goal and opening in new ELS . Chronic Disease Management support, education, and care coordination needs related to CHF, HTN, HLD, DMII, and CKD Stage 4  Clinical Goal(s) related to CHF, HTN, HLD, DMII, and CKD Stage 4 :  Over the next 120 days, patient will:  . Work with the care management team to address educational, disease management, and care coordination needs  . Begin or continue self health monitoring activities as directed today Measure and record cbg (blood glucose) bid times daily, Measure and record blood pressure 5 times per week, and Measure and record weight daily- currently does not have a scale . Call provider office for new or worsened signs and symptoms Blood glucose findings outside established parameters, Blood pressure findings outside established parameters, Weight outside established parameters, Shortness of breath, and New or worsened symptom related to CKD 4 . Call care management team with questions or concerns . Verbalize basic understanding of patient centered plan of care established today  Interventions related to CHF, HTN, HLD, DMII, and CKD Stage 4 :  . Evaluation of current treatment plans and patient's adherence to plan as established by provider.  10-17-2020: The patient states she is doing well. Her shoulder pain is a lot better since seeing pcp on 10-05-2020 and she is taking the baclofen.  The patient is using her equipment she received from [facility]. States her blood pressure is up and down. States this am it was up to 379 systolic. She states it is likely because her "machine" goes off at 9 am and she gets up at 10 am and then she hasn't bee moving around or taking her medication yet.  The patient states that she has not had any issues with headache.  Will continue to  monitor.  . Assessed patient understanding of disease states.  The patient verbalized understanding of disease processes.  States compliance with medications.  . Assessed patient's education and care coordination needs.  10-17-2020: Review of new concerns and the patient denies any new concerns. She does have to follow up with nephrology, Dr. Milana Na on 10-24-2020.  She says I guess they found something on the lab work he wants to talk to the patient about. Reassured the patient to keep appointment and know that her healthcare team wants to help manage her health and well being.  . Provided disease specific education to patient. Education on following a heart healthy/ADA diet. The patient states she does not use salt.  Her hemoglobin A1C is 8.5 and indicates uncontrolled diabetes.  Education on the benefits of following a heart healthy/ADA diet.  Will send educational material to the patient concerning carb modified diet and also DASH diet. 07-25-2020: In office visit with the pcp. The patients hemoglobin A1C is down to 7.6%. Praised for making progress. Saw endocrinologist on 07-23-2020 and is working on getting the [name].  The patient has been trying to get this for a while but admits it is probably her fault because she didn't get the paperwork done.  Reviewed and gave the patient written information on DM management with a goal of fasting blood sugars being <130 and post prandial <180.  The patient states this am her blood sugar was 87; however some  mornings it is >160.  The patient states when she checks it at night it is 130-200's.  Review of hemoglobin A1c of 7.0% or less.  The patient wants to do well with managing her DM and other chronic conditions. 10-17-2020: The patient states she is doing well with managing her diabetes. Denies any episodes of hypoglycemia.  Nash Dimmer with appropriate clinical care team members regarding patient needs.  The patient is currently working with the pharmacist  with medications management.  The patient endorses compliance. States she is taking her insulin and other medications as directed. The MD has filled out paperwork for her to get the [facility] continuous glucose meter. 07-25-2020: the patient is moving forward with getting her [facility] and also has a number to call to get medication assistance for her insulins.  . Assessed the patients ability to weigh daily and the patient verbalized she does now have a scale, (07-25-2020) the patient is currently enrolled in the case management program with her insurance company and has a scale and a blood pressure cuff. She is weighing daily and her weight is staying between 170-173.  Education provided. Review and explained the importance of daily weights due to her chronic condition of heart failure and the need to call the provider for >2 pound weight gain over night or >5 in one week. The patient verbalized understanding. 10-17-2020: The patient is weighing daily. Her weight this am was 169.   [name] Educated on the importance of elevating feet and legs when sitting and wearing ted hose or socks (patient did not have compression socks on at the time of the call). The patient verbalized she does have issues with swelling in her feet and legs.  The patient states that her swelling is not as bad when she wears sketchers shoes with the memory foam.  Discussed the importance of wearing ted hose to promote circulation. The patient verbalized understanding. 07-25-2020: The patient states that the swelling in her legs is better. The specialist has increased her Lasix. This is currently working well for her. Discussed the Sx/sx of dehydration and the importance of continued daily weights.  . Evaluation of blood pressure checks.   Education on taking blood pressure on a regular basis and writing values down for RNCM, CCM team and pcp so a pattern could be determined.  Education on elevated blood pressure putting the patient at  increased risk and stroke. 07-25-2020: the patient brought her home cuff into the office today. The reading was 163/81 with her home cuff compared to 157/63 with the office machine. The patient with some elevation of blood pressure and states this is what it has been running at home. Educational material provided as well as review of low sodium food options. Provided a log book for the patient to record her blood pressure readings daily. 10-17-2020: Review of how the patient can bring her Vivify tablet in and show the pcp the trend of her blood pressures.  . Education on the importance of following the recommendations of her providers due to the multiple chronic conditions she has. Expressed concern over her health and well being.   . Review of upcoming appointments. Pcp appointment is 8/11 at 11 am. The patient has had her eye exam in June of 2021. Has had colonoscopy, and a referral for mammogram ordered today. The patient continues to see the specialist at the pain clinic for nerve pain related to past shingles.  . Review of CCM team help and provided  the patient with contact information for the CCM team RNCM.     Patient Self Care Activities related to CHF, HTN, HLD, DMII, and CKD Stage 4 :  . Patient is unable to independently self-manage chronic health conditions   Please see past updates related to this goal by clicking on the "Past Updates" button in the selected goal      . RNCM: Manage Chronic Pain       Timeframe:  Short-Term Goal Priority:  High Start Date:   02-15-2021                          Expected End Date:         05-13-2021              Follow Up Date 04-05-2021   - call for medicine refill 2 or 3 days before it runs out - develop a personal pain management plan - keep track of prescription refills - plan exercise or activity when pain is best controlled - prioritize tasks for the day - track times pain is worst and when it is best - track what makes the pain worse and what  makes it better - use ice or heat for pain relief - work slower and less intense when having pain    Why is this important?    Day-to-day life can be hard when you have chronic pain.   Pain medicine is just one piece of the treatment puzzle.   You can try these action steps to help you manage your pain.    Notes: Pain onset x 2 days ago, denies falls or injury     . RNCM: Monitor and Manage My Blood Sugar-Diabetes Type 2       Timeframe:  Long-Range Goal Priority:  High Start Date:  02-15-2021                           Expected End Date:   02-15-2022                    Follow Up Date 04-05-2021   - check blood sugar at prescribed times - check blood sugar before and after exercise - check blood sugar if I feel it is too high or too low - enter blood sugar readings and medication or insulin into daily log - take the blood sugar log to all doctor visits - take the blood sugar meter to all doctor visits    Why is this important?    Checking your blood sugar at home helps to keep it from getting very high or very low.   Writing the results in a diary or log helps the doctor know how to care for you.   Your blood sugar log should have the time, date and the results.   Also, write down the amount of insulin or other medicine that you take.   Other information, like what you ate, exercise done and how you were feeling, will also be helpful.     Notes: Had not been consistently checking blood sugars but has started back now      Patient Care Plan: RNCM: Heart Failure (Adult)    Problem Identified: RNCM: Symptom Exacerbation (Heart Failure)   Priority: Medium    Long-Range Goal: RNCM: Symptom Exacerbation Prevented or Minimized   Priority: Medium  Note:   Current Barriers:  [name] Knowledge deficits related  to basic heart failure pathophysiology and self care management . Lacks social connections . Does not contact provider office for questions/concerns . Lack of scale in  home . Financial strain Nurse Case Manager Clinical Goal(s):   patient will weigh self daily and record  patient will verbalize understanding of Heart Failure Action Plan and when to call doctor  patient will take all Heart Failure mediations as prescribed Interventions:  . Collaboration with [name], NP regarding development and update of comprehensive plan of care as evidenced by provider attestation and co-signature . Inter-disciplinary care team collaboration (see longitudinal plan of care) . Basic overview and discussion of pathophysiology of Heart Failure . Provided written and verbal education on low sodium diet . Reviewed Heart Failure Action Plan in depth and provided written copy . Assessed for scales in home . Discussed importance of daily weight . Reviewed role of diuretics in prevention of fluid overload Patient Goals/Self-Care Activities:  Takes Heart Failure Medications as prescribed Weighs daily and record (notifying MD of 3 lb weight gain over night or 5 lb in a week) Verbalizes understanding of and follows CHF Action Plan Adheres to low sodium diet  - Take Heart Failure Medications as prescribed - Weigh daily and record (notify MD with 3 lb weight gain over night or 5 lb in a week) - Follow CHF Action Plan - Adhere to low sodium diet - barriers to lifestyle changes reviewed and addressed - barriers to treatment reviewed and addressed - cognitive screening completed and reviewed - depression screen reviewed - health literacy screening completed or reviewed - healthy lifestyle promoted - medication-adherence assessment completed - rescue (action) plan developed - rescue (action) plan reviewed - self-awareness of signs/symptoms of worsening disease encouraged Follow Up Plan: Telephone follow up appointment with care management team member scheduled for: 04-05-2021 at 1 pm   Task: RNCM: Identify and Minimize Risk of Heart Failure Exacerbation   Note:    Care Management Activities:    - barriers to lifestyle changes reviewed and addressed - barriers to treatment reviewed and addressed - cognitive screening completed and reviewed - depression screen reviewed - health literacy screening completed or reviewed - healthy lifestyle promoted - medication-adherence assessment completed - rescue (action) plan developed - rescue (action) plan reviewed - self-awareness of signs/symptoms of worsening disease encouraged       Patient Care Plan: RNCM: Chronic Pain (Adult)    Problem Identified: RNCM: Pain Management Plan (Chronic Pain)   Priority: High  Onset Date: 02/12/2021    Goal: RNCM: Pain Management Plan Developed   Start Date: 02/12/2021  Priority: High  Note:   Current Barriers:  [name] Knowledge Deficits related to managing acute/chronic pain . Non-adherence to scheduled provider appointments . Non-adherence to prescribed medication regimen . Difficulty obtaining medications . Chronic Disease Management support and education needs related to chronic pain . Unable to independently manage pain and discomfort to lower back radiating down left leg . Lacks social connections . Unable to perform IADLs independently . Does not contact provider office for questions/concerns Nurse Case Manager Clinical Goal(s):  . patient will verbalize understanding of plan for managing pain . patient will attend all scheduled medical appointments: 07-29-2021 with pcp, may need sooner appointment, specialist to evaluate pain on 03-05-2021 . patient will demonstrate use of different relaxation  skills and/or diversional activities to assist with pain reduction (distraction, imagery, relaxation, massage, acupressure, TENS, heat, and cold application . patient will report pain at a level less than 3 to  4 on a 10-10 rating scale . patient will use pharmacological and nonpharmacological pain relief strategies . patient will verbalize acceptable level of pain relief  and ability to engage in desired activities . patient will engage in desired activities without an increase in pain level Interventions:  . Collaboration with [name], NP regarding development and update of comprehensive plan of care as evidenced by provider attestation and co-signature . Inter-disciplinary care team collaboration (see longitudinal plan of care) . - careful application of heat or ice encouraged . - deep breathing, relaxation and mindfulness use promoted . - effectiveness of pharmacologic therapy monitored . - medication-induced side effects managed . - misuse of pain medication assessed . - motivation and barriers to change assessed and addressed . - mutually acceptable comfort goal set . - pain assessed . - pain treatment goals reviewed . - premedication prior to activity encouraged . Evaluation of current treatment plan related to pain in back going down left leg and patient's adherence to plan as established by provider. . Advised patient to call the office for worsening sx/sx of pain or changes noted  . Provided education to patient re: alternative pain relief measures. The patient tried heat application last night but states it was not helpful. States the pain is going down into her left leg from her back.  . Reviewed medications with patient and discussed compliance. The patient is taking a prednisone dose pack and Robaxin. She feels the Robaxin or the prednisone is not helping  . Collaborated with pcp regarding the patient states she can not see the pain specialist until 03-05-2021, ask about getting a "shot", will discuss with pcp for recommendations  . Discussed plans with patient for ongoing care management follow up and provided patient with direct contact information for care management team . Allow patient to maintain a diary of pain ratings, timing, precipitating events, medications, treatments, and what works best to relieve pain,  . Refer to support  groups and self-help groups . Educate patient about the use of pharmacological interventions for pain management- antianxiety, antidepressants, NSAIDS, opioid analgesics,  . Explain the importance of lifestyle modifications to effective pain management  Patient Goals/Self Care Activities:  .  [name] Self-administers medications as prescribed . Attends all scheduled provider appointments . Calls pharmacy for medication refills . Calls provider office for new concerns or questions Follow Up Plan: Telephone follow up appointment with care management team member scheduled for: 04-05-2021 at 1 pm     Patient Care Plan: RNCM: Hypertension (Adult)    Problem Identified: RNCM: Hypertension (Hypertension)   Priority: Medium    Long-Range Goal: RNCM: Hypertension Monitored   Priority: Medium  Note:   Objective:  . Last practice recorded BP readings:  BP Readings from Last 3 Encounters:  02/12/21 115/63  02/05/21 (!) 152/78  01/29/21 132/74 .   [name] Most recent eGFR/CrCl: No results found for: EGFR  No components found for: CRCL Current Barriers:  [name] Knowledge Deficits related to basic understanding of hypertension pathophysiology and self care management . Knowledge Deficits related to understanding of medications prescribed for management of hypertension . Limited Social Support . Lacks social connections . Unable to perform IADLs independently . Does not contact provider office for questions/concerns Case Manager Clinical Goal(s):  [name] Over the next 120 days, patient will verbalize understanding of plan for hypertension management . Over the next 120 days, patient will attend all scheduled medical appointments: 07-29-2021 . Over the next 120 days, patient will demonstrate improved  adherence to prescribed treatment plan for hypertension as evidenced by taking all medications as prescribed, monitoring and recording blood pressure as directed, adhering to low sodium/DASH diet . Over the next 120 days,  patient will demonstrate improved health management independence as evidenced by checking blood pressure as directed and notifying PCP if SBP>160 or DBP > 90, taking all medications as prescribe, and adhering to a low sodium diet as discussed. . Over the next 120 days, patient will verbalize basic understanding of hypertension disease process and self health management plan as evidenced by compliance with medications, compliance with diet, and working with CCM team to manage health and well being  Interventions:  . Collaboration with [name], NP regarding development and update of comprehensive plan of care as evidenced by provider attestation and co-signature . Inter-disciplinary care team collaboration (see longitudinal plan of care) . Evaluation of current treatment plan related to hypertension self management and patient's adherence to plan as established by provider. . Provided education to patient re: stroke prevention, s/s of heart attack and stroke, DASH diet, complications of uncontrolled blood pressure . Reviewed medications with patient and discussed importance of compliance . Discussed plans with patient for ongoing care management follow up and provided patient with direct contact information for care management team . Advised patient, providing education and rationale, to monitor blood pressure daily and record, calling PCP for findings outside established parameters.  . Reviewed scheduled/upcoming provider appointments including: 07-29-2021 Patient Goals: - blood pressure trends reviewed - depression screen reviewed - home or ambulatory blood pressure monitoring encouraged Self-Care Activities: - Self administers medications as prescribed Attends all scheduled provider appointments Calls provider office for new concerns, questions, or BP outside discussed parameters Checks BP and records as discussed Follows a low sodium diet/DASH diet Follow Up Plan: Telephone follow up  appointment with care management team member scheduled for:  04-05-2021 at 1 pm   Task: RNCM: Identify and Monitor Blood Pressure Elevation   Note:   Care Management Activities:    - blood pressure trends reviewed - depression screen reviewed - home or ambulatory blood pressure monitoring encouraged       Patient Care Plan: RNCM: Diabetes Type 2 (Adult)    Problem Identified: RNCM: Glycemic Management (Diabetes, Type 2)   Priority: High    Long-Range Goal: RNCM: Glycemic Management Optimized   Priority: High  Note:   Objective:  Lab Results  Component Value Date   HGBA1C 8.2 (H) 01/29/2021 .   Lab Results  Component Value Date   CREATININE 2.45 (H) 02/12/2021   CREATININE 2.60 (H) 01/29/2021   CREATININE 2.56 (H) 10/25/2020 .   [name] No results found for: EGFR Current Barriers:  [name] Knowledge Deficits related to medications used for management of diabetes . Does not use cbg meter  . Limited Social Support . Unable to independently manage blood sugars . Unable to self administer medications as prescribed . Does not adhere to provider recommendations re: checking blood sugars as recommended by the provider  . Does not adhere to prescribed medication regimen . Lacks social connections Case Manager Clinical Goal(s):  . patient will demonstrate improved adherence to prescribed treatment plan for diabetes self care/management as evidenced by: daily monitoring and recording of CBG  adherence to ADA/ carb modified diet exercise 3/4 days/week adherence to prescribed medication regimen contacting provider for new or worsened symptoms or questions Interventions:  . Collaboration with [name], NP regarding development and update of comprehensive plan of care as  evidenced by provider attestation and co-signature . Inter-disciplinary care team collaboration (see longitudinal plan of care) . Provided education to patient about basic DM disease process . Reviewed medications with  patient and discussed importance of medication adherence . Discussed plans with patient for ongoing care management follow up and provided patient with direct contact information for care management team . Provided patient with written educational materials related to hypo and hyperglycemia and importance of correct treatment . Reviewed scheduled/upcoming provider appointments including: 04-05-2021 at 1 pm . Advised patient, providing education and rationale, to check cbg bid and record, calling pcp for findings outside established parameters.   . Review of patient status, including review of consultants reports, relevant laboratory and other test results, and medications completed. Self-Care Activities - UNABLE to independently manage DM Self administers oral medications as prescribed Self administers insulin as prescribed Attends all scheduled provider appointments Checks blood sugars as prescribed and utilize hyper and hypoglycemia protocol as needed Adheres to prescribed ADA/carb modified Patient Goals: -- barriers to adherence to treatment plan identified - blood glucose monitoring encouraged - blood glucose readings reviewed - encourage participation in diabetes education classes - individualized medical nutrition therapy provided - resources required to improve adherence to care identified - self-awareness of signs/symptoms of hypo or hyperglycemia encouraged - use of blood glucose monitoring log promoted Follow Up Plan: Telephone follow up appointment with care management team member scheduled for: 04-05-2021 at 1 pm   Task: RNCM: Alleviate Barriers to Glycemic Management   Note:   Care Management Activities:    - barriers to adherence to treatment plan identified - blood glucose monitoring encouraged - blood glucose readings reviewed - encourage participation in diabetes education classes - individualized medical nutrition therapy provided - resources required to improve  adherence to care identified - self-awareness of signs/symptoms of hypo or hyperglycemia encouraged - use of blood glucose monitoring log promoted         Patient verbalizes understanding of instructions provided today and agrees to view in [place].   Telephone follow up appointment with care management team member scheduled for: 04-05-2021 at 1 pm  [name] RN, MSN, [facility] Mobile: [phone]

## 2021-02-19 ENCOUNTER — Ambulatory Visit: Admission: RE | Admit: 2021-02-19 | Payer: Medicare Other | Source: Home / Self Care | Admitting: Ophthalmology

## 2021-02-19 SURGERY — PHACOEMULSIFICATION, CATARACT, WITH IOL INSERTION
Anesthesia: Topical | Laterality: Right

## 2021-03-05 ENCOUNTER — Other Ambulatory Visit: Payer: Self-pay

## 2021-03-05 ENCOUNTER — Encounter: Payer: Self-pay | Admitting: Student in an Organized Health Care Education/Training Program

## 2021-03-05 ENCOUNTER — Ambulatory Visit
Payer: Medicare Other | Attending: Student in an Organized Health Care Education/Training Program | Admitting: Student in an Organized Health Care Education/Training Program

## 2021-03-05 VITALS — BP 102/66 | HR 85 | Temp 97.2°F | Resp 16 | Ht 67.0 in | Wt 167.0 lb

## 2021-03-05 DIAGNOSIS — G894 Chronic pain syndrome: Secondary | ICD-10-CM | POA: Insufficient documentation

## 2021-03-05 DIAGNOSIS — N184 Chronic kidney disease, stage 4 (severe): Secondary | ICD-10-CM | POA: Insufficient documentation

## 2021-03-05 DIAGNOSIS — M792 Neuralgia and neuritis, unspecified: Secondary | ICD-10-CM | POA: Insufficient documentation

## 2021-03-05 DIAGNOSIS — B0229 Other postherpetic nervous system involvement: Secondary | ICD-10-CM | POA: Diagnosis not present

## 2021-03-05 DIAGNOSIS — M5416 Radiculopathy, lumbar region: Secondary | ICD-10-CM | POA: Diagnosis not present

## 2021-03-05 NOTE — Progress Notes (Signed)
Safety precautions to be maintained throughout the outpatient stay will include: orient to surroundings, keep bed in low position, maintain call bell within reach at all times, provide assistance with transfer out of bed and ambulation.  

## 2021-03-05 NOTE — Progress Notes (Signed)
PROVIDER NOTE: Information contained herein reflects review and annotations entered in association with encounter. Interpretation of such information and data should be left to medically-trained personnel. Information provided to patient can be located elsewhere in the medical record under "Patient Instructions". Document created using STT-dictation technology, any transcriptional errors that may result from process are unintentional.    Patient: Tonya [name]  Service Category: E/M  Provider: Gillis Santa, MD  DOB: 1946/03/03  DOS: 03/05/2021  Specialty: Interventional Pain Management  MRN: [id number]  Setting: Ambulatory outpatient  PCP: [name], NP  Type: Established Patient    Referring Provider: [name], NP  Location: Office  Delivery: Face-to-face     HPI  Tonya [name], a 75 y.o. year old female, is here today because of her Lumbar radicular pain [M54.16]. Tonya [name] primary complain today is Back Pain (Lower to back of left leg to knee) Last encounter: My last encounter with her was on Visit date not found. Pertinent problems: Tonya [name] has Chronic pain syndrome; Neuropathic pain; Lumbar degenerative disc disease; and Lumbar facet arthropathy on their pertinent problem list. Pain Assessment: Severity of Chronic pain is reported as a 8 /10. Location: Back Lower,Right,Left/NEW AND WORSE TODAY...to left leg to knee. Onset:  . Quality: Cramping. Timing: Constant. Modifying factor(s): sitting, tylenol, aleve. Vitals:  height is 5' 7" (1.702 m) and weight is 167 lb (75.8 kg). Her temporal temperature is 97.2 F (36.2 C) (abnormal). Her blood pressure is 102/66 and her pulse is 85. Her respiration is 16 and oxygen saturation is 100%.   Reason for encounter: post-procedure assessment.    Patient follows up for post procedure evaluation after right L3, L4, L5 facet medial branch nerve block which unfortunately was not effective for her low back pain.  Now she is endorsing  more left-sided pain that radiates down her left leg in a dermatomal fashion.  She does have a positive straight leg raise test.  She describes burning and tingling in his left leg as well.  We discussed a diagnostic left sided lumbar epidural steroid injection.  Risks and benefits of this were reviewed and patient would like to proceed.  ROS  Constitutional: Denies any fever or chills Gastrointestinal: No reported hemesis, hematochezia, vomiting, or acute GI distress Musculoskeletal: Low back pain with radiation into left leg Neurological: No reported episodes of acute onset apraxia, aphasia, dysarthria, agnosia, amnesia, paralysis, loss of coordination, or loss of consciousness  Medication Review  Insulin Pen Needle, aspirin, carvedilol, clopidogrel, docusate sodium, ferrous sulfate, furosemide, insulin detemir, insulin lispro, olmesartan, omeprazole, pioglitazone, rosuvastatin, and vitamin D3  History Review  Allergy: [name] is allergic to atorvastatin, gabapentin, pravastatin, pregabalin, and trulicity [dulaglutide]. Drug: [name]  reports no history of drug use. Alcohol:  reports no history of alcohol use. Tobacco:  reports that she has never smoked. She has never used smokeless tobacco. Social: Tonya [name]  reports that she has never smoked. She has never used smokeless tobacco. She reports that she does not drink alcohol and does not use drugs. Medical:  has a past medical history of (HFpEF) heart failure with preserved ejection fraction ([place]), CKD (chronic kidney disease), stage III ([place]), Diabetes mellitus without complication ([place]), Hyperlipidemia, Hypertension, Stroke ([place]), and Wears dentures. Surgical: Tonya [name]  has a past surgical history that includes Tubal ligation; Colonoscopy (12/15/2006); Colonoscopy with propofol (N/A, 06/06/2019); Colonoscopy with propofol (N/A, 01/16/2020); polypectomy (N/A, 01/16/2020); and Cataract extraction w/PHACO (Left, 02/05/2021). Family: family history  includes Diabetes  in her brother, brother, mother, and sister; Gout in her son; Heart attack in her sister; Hyperlipidemia in her father; Hypertension in her father; Kidney disease in her son; Lymphoma in her sister; Stroke in her mother.  Laboratory Chemistry Profile   Renal Lab Results  Component Value Date   BUN 53 (H) 02/12/2021   CREATININE 2.45 (H) 02/12/2021   BCR 22 01/29/2021   GFRAA 20 (L) 01/29/2021   GFRNONAA 20 (L) 02/12/2021     Hepatic Lab Results  Component Value Date   AST 21 02/12/2021   ALT 12 02/12/2021   ALBUMIN 3.4 (L) 02/12/2021   ALKPHOS 62 02/12/2021     Electrolytes Lab Results  Component Value Date   NA 139 02/12/2021   K 4.3 02/12/2021   CL 109 02/12/2021   CALCIUM 8.7 (L) 02/12/2021   MG 2.3 12/03/2019     Bone Lab Results  Component Value Date   VD25OH 26.9 (L) 05/18/2019     Inflammation (CRP: Acute Phase) (ESR: Chronic Phase) Lab Results  Component Value Date   CRP 1.1 04/14/2018   ESRSEDRATE 14 04/14/2018       Note: Above Lab results reviewed.  Recent Imaging Review  MM 3D SCREEN BREAST BILATERAL CLINICAL DATA:  Screening.  EXAM: DIGITAL SCREENING BILATERAL MAMMOGRAM WITH TOMO AND CAD  COMPARISON:  Previous exam(s).  ACR Breast Density Category c: The breast tissue is heterogeneously dense, which may obscure small masses.  FINDINGS: There are no findings suspicious for malignancy. The images were evaluated with computer-aided detection.  IMPRESSION: No mammographic evidence of malignancy. A result letter of this screening mammogram will be mailed directly to the patient.  RECOMMENDATION: Screening mammogram in one year. (Code:SM-B-01Y)  BI-RADS CATEGORY  1: Negative.  Electronically Signed   By: Tonya [name] M.D.   On: 01/07/2021 14:42 Note: Reviewed        Physical Exam  General appearance: Well nourished, well developed, and well hydrated. In no apparent acute distress Mental status: Alert, oriented x  3 (person, place, & time)       Respiratory: No evidence of acute respiratory distress Eyes: PERLA Vitals: BP 102/66    Pulse 85    Temp (!) 97.2 F (36.2 C) (Temporal)    Resp 16    Ht 5' 7" (1.702 m)    Wt 167 lb (75.8 kg)    LMP  (LMP Unknown)    SpO2 100%    BMI 26.16 kg/m  BMI: Estimated body mass index is 26.16 kg/m as calculated from the following:   Height as of this encounter: 5' 7" (1.702 m).   Weight as of this encounter: 167 lb (75.8 kg). Ideal: Ideal body weight: 61.6 kg (135 lb 12.9 oz) Adjusted ideal body weight: 67.3 kg (148 lb 4.5 oz)   Lumbar Spine Area Exam  Skin & Axial Inspection: No masses, redness, or swelling Alignment: Symmetrical Functional ROM: Pain restricted ROM affecting both sides Stability: No instability detected Muscle Tone/Strength: Functionally intact. No obvious neuro-muscular anomalies detected. Sensory (Neurological): Dermatomal pain pattern Palpation: No palpable anomalies       Provocative Tests: Hyperextension/rotation test: deferred today       Lumbar quadrant test (Kemp's test): deferred today       Lateral bending test: (+) ipsilateral radicular pain, on the left. Positive for left-sided foraminal stenosis. Patrick's Maneuver: deferred today                   FABER* test: deferred today  S-I anterior distraction/compression test: deferred today         S-I lateral compression test: deferred today         S-I Thigh-thrust test: deferred today         S-I Gaenslen's test: deferred today         *(Flexion, ABduction and External Rotation) Gait & Posture Assessment  Ambulation: Unassisted Gait: Relatively normal for age and body habitus Posture: WNL  Lower Extremity Exam    Side: Right lower extremity  Side: Left lower extremity  Stability: No instability observed          Stability: No instability observed          Skin & Extremity Inspection: Skin color, temperature, and hair growth are WNL. No peripheral edema or  cyanosis. No masses, redness, swelling, asymmetry, or associated skin lesions. No contractures.  Skin & Extremity Inspection: Skin color, temperature, and hair growth are WNL. No peripheral edema or cyanosis. No masses, redness, swelling, asymmetry, or associated skin lesions. No contractures.  Functional ROM: Unrestricted ROM                  Functional ROM: Pain restricted ROM         Limited SLR (straight leg raise)  Muscle Tone/Strength: Functionally intact. No obvious neuro-muscular anomalies detected.  Muscle Tone/Strength: Functionally intact. No obvious neuro-muscular anomalies detected.  Sensory (Neurological): Unimpaired        Sensory (Neurological): Dermatomal pain pattern        DTR: Patellar: deferred today Achilles: deferred today Plantar: deferred today  DTR: Patellar: deferred today Achilles: deferred today Plantar: deferred today  Palpation: No palpable anomalies  Palpation: No palpable anomalies    Assessment   Status Diagnosis  Having a Flare-up Having a Flare-up Controlled 1. Lumbar radicular pain (left)   2. Neuropathic pain   3. Chronic kidney disease, stage 4, severely decreased GFR (HCC)   4. Post herpetic neuralgia   5. Chronic pain syndrome      Plan of Care   Ms. MICHALENE DEBRULER has a current medication list which includes the following long-term medication(s): carvedilol, ferrous sulfate, furosemide, olmesartan, omeprazole, and rosuvastatin.  Orders:  Orders Placed This Encounter  Procedures   Lumbar Epidural Injection    Standing Status:   Future    Standing Expiration Date:   04/05/2021    Scheduling Instructions:     Procedure: Interlaminar Lumbar Epidural Steroid injection (LESI)            Laterality: Midline     Sedation: Patient's choice.     Timeframe: ASAA    Order Specific Question:   Where will this procedure be performed?    Answer:   [facility]   Follow-up plan:   Return in about 8 days (around 03/13/2021) for Left  L-ESI w/o sedation (Stop Plavix for 7 days), without sedation.     Postherpetic neuralgia: Failed gabapentin, Lyrica, Cymbalta, tramadol.  No benefit with diagnostic right L3, L4, L5 lumbar facet medial branch nerve block.  Plan for left lumbar epidural steroid injection.  Future considerations include lumbar MRI.    Recent Visits No visits were found meeting these conditions. Showing recent visits within past 90 days and meeting all other requirements Today's Visits Date Type Provider Dept  03/05/21 Office Visit Gillis Santa, MD [facility]  Showing today's visits and meeting all other requirements Future Appointments No visits were found meeting these conditions. Showing future appointments within next 90  days and meeting all other requirements  I discussed the assessment and treatment plan with the patient. The patient was provided an opportunity to ask questions and all were answered. The patient agreed with the plan and demonstrated an understanding of the instructions.  Patient advised to call back or seek an in-person evaluation if the symptoms or condition worsens.  Duration of encounter: 30 minutes.  Note by: Gillis Santa, MD Date: 03/05/2021; Time: 3:00 PM

## 2021-03-05 NOTE — Patient Instructions (Addendum)
We are planning on doing a lumbar epidural steroid injection to help with your left low back and left leg pain Please stop your Plavix 7 days prior   Moderate Conscious Sedation, Adult Sedation is the use of medicines to promote relaxation and to relieve discomfort and anxiety. Moderate conscious sedation is a type of sedation. Under moderate conscious sedation, you are less alert than normal, but you are still able to respond to instructions, touch, or both. Moderate conscious sedation is used during short medical and dental procedures. It is milder than deep sedation, which is a type of sedation under which you cannot be easily woken up. It is also milder than general anesthesia, which is the use of medicines to make you unconscious. Moderate conscious sedation allows you to return to your regular activities sooner. Tell a health care provider about:  Any allergies you have.  All medicines you are taking, including vitamins, herbs, eye drops, creams, and over-the-counter medicines.  Any use of steroids. This includes steroids taken by mouth or as a cream.  Any problems you or family members have had with sedatives and anesthetic medicines.  Any blood disorders you have.  Any surgeries you have had.  Any medical conditions you have, such as sleep apnea.  Whether you are pregnant or may be pregnant.  Any use of cigarettes, alcohol, marijuana, or drugs. What are the risks? Generally, this is a safe procedure. However, problems may occur, including:  Getting too much medicine (oversedation).  Nausea.  Allergic reaction to medicines.  Trouble breathing. If this happens, a breathing tube may be used. It will be removed when you are awake and breathing on your own.  Heart trouble.  Lung trouble.  Confusion that gets better with time (emergence delirium). What happens before the procedure? Staying hydrated Follow instructions from your health care provider about hydration, which  may include:  Up to 2 hours before the procedure - you may continue to drink clear liquids, such as water, clear fruit juice, black coffee, and plain tea. Eating and drinking restrictions Follow instructions from your health care provider about eating and drinking, which may include:  8 hours before the procedure - stop eating heavy meals or foods, such as meat, fried foods, or fatty foods.  6 hours before the procedure - stop eating light meals or foods, such as toast or cereal.  6 hours before the procedure - stop drinking milk or drinks that contain milk.  2 hours before the procedure - stop drinking clear liquids. Medicines Ask your health care provider about:  Changing or stopping your regular medicines. This is especially important if you are taking diabetes medicines or blood thinners.  Taking medicines such as aspirin and ibuprofen. These medicines can thin your blood. Do not take these medicines unless your health care provider tells you to take them.  Taking over-the-counter medicines, vitamins, herbs, and supplements. Tests and exams  You will have a physical exam.  You may have blood tests done to show how well: ? Your kidneys and liver work. ? Your blood clots. General instructions  Plan to have a responsible adult take you home from the hospital or clinic.  If you will be going home right after the procedure, plan to have a responsible adult care for you for the time you are told. This is important. What happens during the procedure?  You will be given the sedative. The sedative may be given: ? As a pill that you will swallow. It can  also be inserted into the rectum. ? As a spray through the nose. ? As an injection into the muscle. ? As an injection into the vein through an IV.  You may be given oxygen as needed.  Your breathing, heart rate, and blood pressure will be monitored during the procedure.  The medical or dental procedure will be done. The  procedure may vary among health care providers and hospitals.   What happens after the procedure?  Your blood pressure, heart rate, breathing rate, and blood oxygen level will be monitored until you leave the hospital or clinic.  You will get fluids through your IV if needed.  Do not drive or operate machinery until your health care provider says that it is safe. Summary  Sedation is the use of medicines to promote relaxation and to relieve discomfort and anxiety. Moderate conscious sedation is a type of sedation that is used during short medical and dental procedures.  Tell the health care provider about any medical conditions that you have and about all the medicines that you are taking.  You will be given the sedative as a pill, a spray through the nose, an injection into the muscle, or an injection into the vein through an IV. Vital signs are monitored during the sedation.  Moderate conscious sedation allows you to return to your regular activities sooner. This information is not intended to replace advice given to you by your health care provider. Make sure you discuss any questions you have with your health care provider. Document Revised: 03/30/2020 Document Reviewed: 10/27/2019 Elsevier Patient Education  2021 [place]  What are the risk, side effects and possible complications? Generally speaking, most procedures are safe.  However, with any procedure there are risks, side effects, and the possibility of complications.  The risks and complications are dependent upon the sites that are lesioned, or the type of nerve block to be performed.  The closer the procedure is to the spine, the more serious the risks are.  Great care is taken when placing the radio frequency needles, block needles or lesioning probes, but sometimes complications can occur. 1. Infection: Any time there is an injection through the skin, there is a risk of infection.  This is  why sterile conditions are used for these blocks.  There are four possible types of infection. 1. Localized skin infection. 2. Central Nervous System Infection-This can be in the form of Meningitis, which can be deadly. 3. Epidural Infections-This can be in the form of an epidural abscess, which can cause pressure inside of the spine, causing compression of the spinal cord with subsequent paralysis. This would require an emergency surgery to decompress, and there are no guarantees that the patient would recover from the paralysis. 4. Discitis-This is an infection of the intervertebral discs.  It occurs in about 1% of discography procedures.  It is difficult to treat and it may lead to surgery.        2. Pain: the needles have to go through skin and soft tissues, will cause soreness.       3. Damage to internal structures:  The nerves to be lesioned may be near blood vessels or    other nerves which can be potentially damaged.       4. Bleeding: Bleeding is more common if the patient is taking blood thinners such as  aspirin, Coumadin, Ticiid, Plavix, etc., or if he/she have some genetic predisposition  such as hemophilia. Bleeding into  the spinal canal can cause compression of the spinal  cord with subsequent paralysis.  This would require an emergency surgery to  decompress and there are no guarantees that the patient would recover from the  paralysis.       5. Pneumothorax:  Puncturing of a lung is a possibility, every time a needle is introduced in  the area of the chest or upper back.  Pneumothorax refers to free air around the  collapsed lung(s), inside of the thoracic cavity (chest cavity).  Another two possible  complications related to a similar event would include: Hemothorax and Chylothorax.   These are variations of the Pneumothorax, where instead of air around the collapsed  lung(s), you may have blood or chyle, respectively.       6. Spinal headaches: They may occur with any procedures in the  area of the spine.       7. Persistent CSF (Cerebro-Spinal Fluid) leakage: This is a rare problem, but may occur  with prolonged intrathecal or epidural catheters either due to the formation of a fistulous  track or a dural tear.       8. Nerve damage: By working so close to the spinal cord, there is always a possibility of  nerve damage, which could be as serious as a permanent spinal cord injury with  paralysis.       9. Death:  Although rare, severe deadly allergic reactions known as "Anaphylactic  reaction" can occur to any of the medications used.      10. Worsening of the symptoms:  We can always make thing worse.  What are the chances of something like this happening? Chances of any of this occuring are extremely low.  By statistics, you have more of a chance of getting killed in a motor vehicle accident: while driving to the hospital than any of the above occurring .  Nevertheless, you should be aware that they are possibilities.  In general, it is similar to taking a shower.  Everybody knows that you can slip, hit your head and get killed.  Does that mean that you should not shower again?  Nevertheless always keep in mind that statistics do not mean anything if you happen to be on the wrong side of them.  Even if a procedure has a 1 (one) in a 1,000,000 (million) chance of going wrong, it you happen to be that one..Also, keep in mind that by statistics, you have more of a chance of having something go wrong when taking medications.  Who should not have this procedure? If you are on a blood thinning medication (e.g. Coumadin, Plavix, see list of "Blood Thinners"), or if you have an active infection going on, you should not have the procedure.  If you are taking any blood thinners, please inform your physician.  How should I prepare for this procedure?  Do not eat or drink anything at least six hours prior to the procedure.  Bring a driver with you .  It cannot be a taxi.  Come accompanied  by an adult that can drive you back, and that is strong enough to help you if your legs get weak or numb from the local anesthetic.  Take all of your medicines the morning of the procedure with just enough water to swallow them.  If you have diabetes, make sure that you are scheduled to have your procedure done first thing in the morning, whenever possible.  If you have diabetes, take only half  of your insulin dose and notify our nurse that you have done so as soon as you arrive at the clinic.  If you are diabetic, but only take blood sugar pills (oral hypoglycemic), then do not take them on the morning of your procedure.  You may take them after you have had the procedure.  Do not take aspirin or any aspirin-containing medications, at least eleven (11) days prior to the procedure.  They may prolong bleeding.  Wear loose fitting clothing that may be easy to take off and that you would not mind if it got stained with Betadine or blood.  Do not wear any jewelry or perfume  Remove any nail coloring.  It will interfere with some of our monitoring equipment.  NOTE: Remember that this is not meant to be interpreted as a complete list of all possible complications.  Unforeseen problems may occur.  BLOOD THINNERS The following drugs contain aspirin or other products, which can cause increased bleeding during surgery and should not be taken for 2 weeks prior to and 1 week after surgery.  If you should need take something for relief of minor pain, you may take acetaminophen which is found in Tylenol,m Datril, Anacin-3 and Panadol. It is not blood thinner. The products listed below are.  Do not take any of the products listed below in addition to any listed on your instruction sheet.  A.P.C or A.P.C with Codeine Codeine Phosphate Capsules #3 Ibuprofen Ridaura  ABC compound Congesprin Imuran rimadil  Advil Cope Indocin Robaxisal  Alka-Seltzer Effervescent Pain Reliever and Antacid Coricidin or  Coricidin-D  Indomethacin Rufen  Alka-Seltzer plus Cold Medicine Cosprin Ketoprofen S-A-C Tablets  Anacin Analgesic Tablets or Capsules Coumadin Korlgesic Salflex  Anacin Extra Strength Analgesic tablets or capsules CP-2 Tablets Lanoril Salicylate  Anaprox Cuprimine Capsules Levenox Salocol  Anexsia-D Dalteparin Magan Salsalate  Anodynos Darvon compound Magnesium Salicylate Sine-off  Ansaid Dasin Capsules Magsal Sodium Salicylate  Anturane Depen Capsules Marnal Soma  APF Arthritis pain formula Dewitt's Pills Measurin Stanback  Argesic Dia-Gesic Meclofenamic Sulfinpyrazone  Arthritis Bayer Timed Release Aspirin Diclofenac Meclomen Sulindac  Arthritis pain formula Anacin Dicumarol Medipren Supac  Analgesic (Safety coated) Arthralgen Diffunasal Mefanamic Suprofen  Arthritis Strength Bufferin Dihydrocodeine Mepro Compound Suprol  Arthropan liquid Dopirydamole Methcarbomol with Aspirin Synalgos  ASA tablets/Enseals Disalcid Micrainin Tagament  Ascriptin Doan's Midol Talwin  Ascriptin A/D Dolene Mobidin Tanderil  Ascriptin Extra Strength Dolobid Moblgesic Ticlid  Ascriptin with Codeine Doloprin or Doloprin with Codeine Momentum Tolectin  Asperbuf Duoprin Mono-gesic Trendar  Aspergum Duradyne Motrin or Motrin IB Triminicin  Aspirin plain, buffered or enteric coated Durasal Myochrisine Trigesic  Aspirin Suppositories Easprin Nalfon Trillsate  Aspirin with Codeine Ecotrin Regular or Extra Strength Naprosyn Uracel  Atromid-S Efficin Naproxen Ursinus  Auranofin Capsules Elmiron Neocylate Vanquish  Axotal Emagrin Norgesic Verin  Azathioprine Empirin or Empirin with Codeine Normiflo Vitamin E  Azolid Emprazil Nuprin Voltaren  Bayer Aspirin plain, buffered or children's or timed BC Tablets or powders Encaprin Orgaran Warfarin Sodium  Buff-a-Comp Enoxaparin Orudis Zorpin  Buff-a-Comp with Codeine Equegesic Os-Cal-Gesic   Buffaprin Excedrin plain, buffered or Extra Strength Oxalid   Bufferin  Arthritis Strength Feldene Oxphenbutazone   Bufferin plain or Extra Strength Feldene Capsules Oxycodone with Aspirin   Bufferin with Codeine Fenoprofen Fenoprofen Pabalate or Pabalate-SF   Buffets II Flogesic Panagesic   Buffinol plain or Extra Strength Florinal or Florinal with Codeine Panwarfarin   Buf-Tabs Flurbiprofen Penicillamine   Butalbital Compound Four-way cold tablets Penicillin  Butazolidin Fragmin Pepto-Bismol   Carbenicillin Geminisyn Percodan   Carna Arthritis Reliever Geopen Persantine   Carprofen Gold's salt Persistin   Chloramphenicol Goody's Phenylbutazone   Chloromycetin Haltrain Piroxlcam   Clmetidine heparin Plaquenil   Cllnoril Hyco-pap Ponstel   Clofibrate Hydroxy chloroquine Propoxyphen         Before stopping any of these medications, be sure to consult the physician who ordered them.  Some, such as Coumadin (Warfarin) are ordered to prevent or treat serious conditions such as "deep thrombosis", "pumonary embolisms", and other heart problems.  The amount of time that you may need off of the medication may also vary with the medication and the reason for which you were taking it.  If you are taking any of these medications, please make sure you notify your pain physician before you undergo any procedures.         Epidural Steroid Injection Patient Information  Description: The epidural space surrounds the nerves as they exit the spinal cord.  In some patients, the nerves can be compressed and inflamed by a bulging disc or a tight spinal canal (spinal stenosis).  By injecting steroids into the epidural space, we can bring irritated nerves into direct contact with a potentially helpful medication.  These steroids act directly on the irritated nerves and can reduce swelling and inflammation which often leads to decreased pain.  Epidural steroids may be injected anywhere along the spine and from the neck to the low back depending upon the location of your  pain.   After numbing the skin with local anesthetic (like Novocaine), a small needle is passed into the epidural space slowly.  You may experience a sensation of pressure while this is being done.  The entire block usually last less than 10 minutes.  Conditions which may be treated by epidural steroids:   Low back and leg pain  Neck and arm pain  Spinal stenosis  Post-laminectomy syndrome  Herpes zoster (shingles) pain  Pain from compression fractures  Preparation for the injection:  1. Do not eat any solid food or dairy products within 8 hours of your appointment.  2. You may drink clear liquids up to 3 hours before appointment.  Clear liquids include water, black coffee, juice or soda.  No milk or cream please. 3. You may take your regular medication, including pain medications, with a sip of water before your appointment  Diabetics should hold regular insulin (if taken separately) and take 1/2 normal NPH dos the morning of the procedure.  Carry some sugar containing items with you to your appointment. 4. A driver must accompany you and be prepared to drive you home after your procedure.  5. Bring all your current medications with your. 6. An IV may be inserted and sedation may be given at the discretion of the physician.   7. A blood pressure cuff, EKG and other monitors will often be applied during the procedure.  Some patients may need to have extra oxygen administered for a short period. 8. You will be asked to provide medical information, including your allergies, prior to the procedure.  We must know immediately if you are taking blood thinners (like Coumadin/Warfarin)  Or if you are allergic to IV iodine contrast (dye). We must know if you could possible be pregnant.  Possible side-effects:  Bleeding from needle site  Infection (rare, may require surgery)  Nerve injury (rare)  Numbness & tingling (temporary)  Difficulty urinating (rare, temporary)  Spinal headache (  a headache  worse with upright posture)  Light -headedness (temporary)  Pain at injection site (several days)  Decreased blood pressure (temporary)  Weakness in arm/leg (temporary)  Pressure sensation in back/neck (temporary)  Call if you experience:  Fever/chills associated with headache or increased back/neck pain.  Headache worsened by an upright position.  New onset weakness or numbness of an extremity below the injection site  Hives or difficulty breathing (go to the emergency room)  Inflammation or drainage at the infection site  Severe back/neck pain  Any new symptoms which are concerning to you  Please note:  Although the local anesthetic injected can often make your back or neck feel good for several hours after the injection, the pain will likely return.  It takes 3-7 days for steroids to work in the epidural space.  You may not notice any pain relief for at least that one week.  If effective, we will often do a series of three injections spaced 3-6 weeks apart to maximally decrease your pain.  After the initial series, we generally will wait several months before considering a repeat injection of the same type.  If you have any questions, please call [phone] [facility]

## 2021-03-07 ENCOUNTER — Encounter: Payer: Self-pay | Admitting: Ophthalmology

## 2021-03-13 ENCOUNTER — Other Ambulatory Visit: Payer: Self-pay

## 2021-03-13 ENCOUNTER — Ambulatory Visit
Admission: RE | Admit: 2021-03-13 | Discharge: 2021-03-13 | Disposition: A | Discharge status: Home / Self Care | Payer: Medicare Other | Source: Ambulatory Visit | Attending: Student in an Organized Health Care Education/Training Program | Admitting: Student in an Organized Health Care Education/Training Program

## 2021-03-13 ENCOUNTER — Ambulatory Visit (HOSPITAL_BASED_OUTPATIENT_CLINIC_OR_DEPARTMENT_OTHER): Payer: Medicare Other | Admitting: Student in an Organized Health Care Education/Training Program

## 2021-03-13 ENCOUNTER — Encounter: Payer: Self-pay | Admitting: Student in an Organized Health Care Education/Training Program

## 2021-03-13 VITALS — BP 139/88 | HR 96 | Temp 97.9°F | Resp 20 | Ht 67.0 in | Wt 168.0 lb

## 2021-03-13 DIAGNOSIS — M5416 Radiculopathy, lumbar region: Secondary | ICD-10-CM | POA: Insufficient documentation

## 2021-03-13 DIAGNOSIS — Z79899 Other long term (current) drug therapy: Secondary | ICD-10-CM | POA: Insufficient documentation

## 2021-03-13 DIAGNOSIS — G894 Chronic pain syndrome: Secondary | ICD-10-CM

## 2021-03-13 DIAGNOSIS — B0229 Other postherpetic nervous system involvement: Secondary | ICD-10-CM | POA: Diagnosis not present

## 2021-03-13 DIAGNOSIS — Z7982 Long term (current) use of aspirin: Secondary | ICD-10-CM | POA: Insufficient documentation

## 2021-03-13 DIAGNOSIS — M792 Neuralgia and neuritis, unspecified: Secondary | ICD-10-CM | POA: Diagnosis not present

## 2021-03-13 DIAGNOSIS — Z794 Long term (current) use of insulin: Secondary | ICD-10-CM | POA: Diagnosis not present

## 2021-03-13 LAB — GLUCOSE, CAPILLARY
Glucose-Capillary: 30 mg/dL — CL (ref 70–99)
Glucose-Capillary: 51 mg/dL — ABNORMAL LOW (ref 70–99)
Glucose-Capillary: 74 mg/dL (ref 70–99)

## 2021-03-13 MED ORDER — IOHEXOL 180 MG/ML  SOLN
10.0000 mL | Freq: Once | INTRAMUSCULAR | Status: AC
  Administered 2021-03-13: 10 mL via EPIDURAL

## 2021-03-13 MED ORDER — DEXAMETHASONE SODIUM PHOSPHATE 10 MG/ML IJ SOLN
10.0000 mg | Freq: Once | INTRAMUSCULAR | Status: AC
  Administered 2021-03-13: 10 mg
  Filled 2021-03-13: qty 1

## 2021-03-13 MED ORDER — LIDOCAINE HCL 2 % IJ SOLN
20.0000 mL | Freq: Once | INTRAMUSCULAR | Status: AC
  Administered 2021-03-13: 400 mg
  Filled 2021-03-13: qty 40

## 2021-03-13 MED ORDER — ROPIVACAINE HCL 2 MG/ML IJ SOLN
2.0000 mL | Freq: Once | INTRAMUSCULAR | Status: AC
  Administered 2021-03-13: 2 mL via EPIDURAL
  Filled 2021-03-13: qty 10

## 2021-03-13 MED ORDER — SODIUM CHLORIDE 0.9% FLUSH
2.0000 mL | Freq: Once | INTRAVENOUS | Status: AC
  Administered 2021-03-13: 2 mL

## 2021-03-13 NOTE — Progress Notes (Signed)
PROVIDER NOTE: Information contained herein reflects review and annotations entered in association with encounter. Interpretation of such information and data should be left to medically-trained personnel. Information provided to patient can be located elsewhere in the medical record under "Patient Instructions". Document created using STT-dictation technology, any transcriptional errors that may result from process are unintentional.    Patient: Tonya [name]  Service Category: Procedure  Provider: Gillis Santa, MD  DOB: July 31, 1946  DOS: 03/13/2021  Location: [facility]  MRN: [id number]  Setting: Ambulatory - outpatient  Referring Provider: [name], NP  Type: Established Patient  Specialty: Interventional Pain Management  PCP: [name], NP   Primary Reason for Visit: Interventional Pain Management Treatment. CC: Back Pain (Bilateral lumbar )  Procedure:          Anesthesia, Analgesia, Anxiolysis:  Type: Palliative Inter-Laminar Epidural Steroid Injection           Region: Lumbar Level: L4-5 Level. Laterality: Left-Sided         Type: Local Anesthesia  Local Anesthetic: Lidocaine 1-2%  Position: Prone with head of the table was raised to facilitate breathing.   Indications: 1. Lumbar radicular pain (left)   2. Neuropathic pain   3. Post herpetic neuralgia   4. Chronic pain syndrome    Pain Score: Pre-procedure: 1 /10 Post-procedure: 0-No pain/10   Pre-op H&P Assessment:  Tonya [name] is a 75 y.o. (year old), female patient, seen today for interventional treatment. She  has a past surgical history that includes Tubal ligation; Colonoscopy (12/15/2006); Colonoscopy with propofol (N/A, 06/06/2019); Colonoscopy with propofol (N/A, 01/16/2020); polypectomy (N/A, 01/16/2020); and Cataract extraction w/PHACO (Left, 02/05/2021). Tonya [name] has a current medication list which includes the following prescription(s): aspirin, carvedilol, clopidogrel, ferrous sulfate,  furosemide, humalog kwikpen, insulin detemir, novofine, olmesartan, omeprazole, pioglitazone, rosuvastatin, vitamin d3, and docusate sodium. Her primarily concern today is the Back Pain (Bilateral lumbar )  Initial Vital Signs:  Pulse/HCG Rate: 96ECG Heart Rate: 94 Temp: 97.9 F (36.6 C) Resp: 16 BP: 135/78 SpO2: 100 %  BMI: Estimated body mass index is 26.31 kg/m as calculated from the following:   Height as of this encounter: 5\' 7"  (1.702 m).   Weight as of this encounter: 168 lb (76.2 kg).  Risk Assessment: Allergies: Reviewed. She is allergic to atorvastatin, gabapentin, pravastatin, pregabalin, and trulicity [dulaglutide].  Allergy Precautions: None required Coagulopathies: Reviewed. None identified.  Blood-thinner therapy: None at this time Active Infection(s): Reviewed. None identified. Tonya [name] is afebrile  Site Confirmation: [name] was asked to confirm the procedure and laterality before marking the site Procedure checklist: Completed Consent: Before the procedure and under the influence of no sedative(s), amnesic(s), or anxiolytics, the patient was informed of the treatment options, risks and possible complications. To fulfill our ethical and legal obligations, as recommended by the American Medical Association's Code of Ethics, I have informed the patient of my clinical impression; the nature and purpose of the treatment or procedure; the risks, benefits, and possible complications of the intervention; the alternatives, including doing nothing; the risk(s) and benefit(s) of the alternative treatment(s) or procedure(s); and the risk(s) and benefit(s) of doing nothing. The patient was provided information about the general risks and possible complications associated with the procedure. These may include, but are not limited to: failure to achieve desired goals, infection, bleeding, organ or nerve damage, allergic reactions, paralysis, and death. In addition, the patient was  informed of those risks and complications associated to Spine-related procedures, such  as failure to decrease pain; infection (i.e.: Meningitis, epidural or intraspinal abscess); bleeding (i.e.: epidural hematoma, subarachnoid hemorrhage, or any other type of intraspinal or peri-dural bleeding); organ or nerve damage (i.e.: Any type of peripheral nerve, nerve root, or spinal cord injury) with subsequent damage to sensory, motor, and/or autonomic systems, resulting in permanent pain, numbness, and/or weakness of one or several areas of the body; allergic reactions; (i.e.: anaphylactic reaction); and/or death. Furthermore, the patient was informed of those risks and complications associated with the medications. These include, but are not limited to: allergic reactions (i.e.: anaphylactic or anaphylactoid reaction(s)); adrenal axis suppression; blood sugar elevation that in diabetics may result in ketoacidosis or comma; water retention that in patients with history of congestive heart failure may result in shortness of breath, pulmonary edema, and decompensation with resultant heart failure; weight gain; swelling or edema; medication-induced neural toxicity; particulate matter embolism and blood vessel occlusion with resultant organ, and/or nervous system infarction; and/or aseptic necrosis of one or more joints. Finally, the patient was informed that Medicine is not an exact science; therefore, there is also the possibility of unforeseen or unpredictable risks and/or possible complications that may result in a catastrophic outcome. The patient indicated having understood very clearly. We have given the patient no guarantees and we have made no promises. Enough time was given to the patient to ask questions, all of which were answered to the patient's satisfaction. Tonya [name] has indicated that she wanted to continue with the procedure. Attestation: I, the ordering provider, attest that I have discussed with the  patient the benefits, risks, side-effects, alternatives, likelihood of achieving goals, and potential problems during recovery for the procedure that I have provided informed consent. Date  Time: 03/13/2021  9:49 AM  Pre-Procedure Preparation:  Monitoring: As per clinic protocol. Respiration, ETCO2, SpO2, BP, heart rate and rhythm monitor placed and checked for adequate function Safety Precautions: Patient was assessed for positional comfort and pressure points before starting the procedure. Time-out: I initiated and conducted the "Time-out" before starting the procedure, as per protocol. The patient was asked to participate by confirming the accuracy of the "Time Out" information. Verification of the correct person, site, and procedure were performed and confirmed by me, the nursing staff, and the patient. "Time-out" conducted as per Joint Commission's Universal Protocol (UP.01.01.01). Time: 1045  Description of Procedure:          Target Area: The interlaminar space, initially targeting the lower laminar border of the superior vertebral body. Approach: Paramedial approach. Area Prepped: Entire Posterior Lumbar Region DuraPrep (Iodine Povacrylex [0.7% available iodine] and Isopropyl Alcohol, 74% w/w) Safety Precautions: Aspiration looking for blood return was conducted prior to all injections. At no point did we inject any substances, as a needle was being advanced. No attempts were made at seeking any paresthesias. Safe injection practices and needle disposal techniques used. Medications properly checked for expiration dates. SDV (single dose vial) medications used. Description of the Procedure: Protocol guidelines were followed. The procedure needle was introduced through the skin, ipsilateral to the reported pain, and advanced to the target area. Bone was contacted and the needle walked caudad, until the lamina was cleared. The epidural space was identified using "loss-of-resistance technique"  with 2-3 ml of PF-NaCl (0.9% NSS), in a 5cc LOR glass syringe.  Vitals:   03/13/21 1112 03/13/21 1122 03/13/21 1134 03/13/21 1142  BP: 115/71 (!) 164/77 (!) 170/85 139/88  Pulse:      Resp: 20 20 (!) 22 20  Temp:  TempSrc:      SpO2: 98% 100% 100% 100%  Weight:      Height:        Start Time: 1045 hrs. End Time: 1050 hrs.  Materials:  Needle(s) Type: Epidural needle Gauge: 17G Length: 3.5-in Medication(s): Please see orders for medications and dosing details.  Imaging Guidance (Spinal):          Type of Imaging Technique: Fluoroscopy Guidance (Spinal) Indication(s): Assistance in needle guidance and placement for procedures requiring needle placement in or near specific anatomical locations not easily accessible without such assistance. Exposure Time: Please see nurses notes. Contrast: Before injecting any contrast, we confirmed that the patient did not have an allergy to iodine, shellfish, or radiological contrast. Once satisfactory needle placement was completed at the desired level, radiological contrast was injected. Contrast injected under live fluoroscopy. No contrast complications. See chart for type and volume of contrast used. Fluoroscopic Guidance: I was personally present during the use of fluoroscopy. "Tunnel Vision Technique" used to obtain the best possible view of the target area. Parallax error corrected before commencing the procedure. "Direction-depth-direction" technique used to introduce the needle under continuous pulsed fluoroscopy. Once target was reached, antero-posterior, oblique, and lateral fluoroscopic projection used confirm needle placement in all planes. Images permanently stored in EMR. Interpretation: I personally interpreted the imaging intraoperatively. Adequate needle placement confirmed in multiple planes. Appropriate spread of contrast into desired area was observed. No evidence of afferent or efferent intravascular uptake. No intrathecal or  subarachnoid spread observed. Permanent images saved into the patient's record.  Antibiotic Prophylaxis:   Anti-infectives (From admission, onward)   None     Indication(s): None identified  Post-operative Assessment:  Post-procedure Vital Signs:  Pulse/HCG Rate: 9680 Temp: 97.9 F (36.6 C) Resp: 20 BP: 139/88 SpO2: 100 %  EBL: None  Complications: No immediate post-treatment complications observed by team, or reported by patient.  Note: The patient tolerated the entire procedure well. A repeat set of vitals were taken after the procedure and the patient was kept under observation following institutional policy, for this type of procedure. Post-procedural neurological assessment was performed, showing return to baseline, prior to discharge. The patient was provided with post-procedure discharge instructions, including a section on how to identify potential problems. Should any problems arise concerning this procedure, the patient was given instructions to immediately contact us, at any time, without hesitation. In any case, we plan to contact the patient by telephone for a follow-up status report regarding this interventional procedure.  Patient developed altered mental status after the procedure.  Her blood glucose was less than 40.  She was given p.o. intake with graham crackers and juice which helped to bring her blood sugars up and she was instructed to have a meal so a plate was ordered from the cafeteria.  Patient's mental status improved to baseline.  She took her insulin this morning however did not eat much.  Her blood sugar when she took her insulin this morning was 150.  On repeat her blood sugar had returned to greater than 75.  She was alert and oriented x3.  Endorsing improvement in left lumbar radicular pain.  Follow-up in 3 to 4 weeks for post procedure evaluation.  5 out of 5 strength bilateral lower extremity: Plantar flexion, dorsiflexion, knee flexion, knee  extension.   Comments:  No additional relevant information.  Plan of Care  Orders:  Orders Placed This Encounter  Procedures  . DG PAIN CLINIC C-ARM 1-60 MIN NO REPORT  Intraoperative interpretation by procedural physician at [facility].    Standing Status:   Standing    Number of Occurrences:   1    Order Specific Question:   Reason for exam:    Answer:   Assistance in needle guidance and placement for procedures requiring needle placement in or near specific anatomical locations not easily accessible without such assistance.  . Glucose, capillary  . Glucose, capillary  . Glucose, capillary   Medications ordered for procedure: Meds ordered this encounter  Medications  . iohexol (OMNIPAQUE) 180 MG/ML injection 10 mL    Must be Myelogram-compatible. If not available, you may substitute with a water-soluble, non-ionic, hypoallergenic, myelogram-compatible radiological contrast medium.  [name] lidocaine (XYLOCAINE) 2 % (with pres) injection 400 mg  . ropivacaine (PF) 2 mg/mL (0.2%) (NAROPIN) injection 2 mL  . sodium chloride flush (NS) 0.9 % injection 2 mL  . dexamethasone (DECADRON) injection 10 mg   Medications administered: We administered iohexol, lidocaine, ropivacaine (PF) 2 mg/mL (0.2%), sodium chloride flush, and dexamethasone.  See the medical record for exact dosing, route, and time of administration.  Follow-up plan:   Return in about 4 weeks (around 04/10/2021) for Post Procedure Evaluation, virtual.      Postherpetic neuralgia: Failed gabapentin, Lyrica, Cymbalta, tramadol.  No benefit with diagnostic right L3, L4, L5 lumbar facet medial branch nerve block.  Left L4/5 ESI  03/13/2021.  Future considerations include lumbar MRI.     Recent Visits Date Type Provider Dept  03/05/21 Office Visit Gillis Santa, MD [facility]  Showing recent visits within past 90 days and meeting all other requirements Today's Visits Date Type Provider Dept  03/13/21  Procedure visit Gillis Santa, MD [facility]  Showing today's visits and meeting all other requirements Future Appointments Date Type Provider Dept  04/08/21 Appointment Gillis Santa, MD [facility]  Showing future appointments within next 90 days and meeting all other requirements  Disposition: Discharge home  Discharge (Date  Time): 03/13/2021; 1210 hrs.   Primary Care Physician: [name], NP Location: [facility] Note by: Gillis Santa, MD Date: 03/13/2021; Time: 12:15 PM  Disclaimer:  Medicine is not an exact science. The only guarantee in medicine is that nothing is guaranteed. It is important to note that the decision to proceed with this intervention was based on the information collected from the patient. The Data and conclusions were drawn from the patient's questionnaire, the interview, and the physical examination. Because the information was provided in large part by the patient, it cannot be guaranteed that it has not been purposely or unconsciously manipulated. Every effort has been made to obtain as much relevant data as possible for this evaluation. It is important to note that the conclusions that lead to this procedure are derived in large part from the available data. Always take into account that the treatment will also be dependent on availability of resources and existing treatment guidelines, considered by other Pain Management Practitioners as being common knowledge and practice, at the time of the intervention. For Medico-Legal purposes, it is also important to point out that variation in procedural techniques and pharmacological choices are the acceptable norm. The indications, contraindications, technique, and results of the above procedure should only be interpreted and judged by a Board-Certified Interventional Pain Specialist with extensive familiarity and expertise in the same exact procedure and technique.

## 2021-03-13 NOTE — Progress Notes (Signed)
Dr. Holley Raring orders to keep patient for 30 min and make sure she is able to stand prior to discharge. Report to Alinda Sierras RN.

## 2021-03-13 NOTE — Patient Instructions (Addendum)
Restart Plavix tomorrow.Pain Management Discharge Instructions  General Discharge Instructions :  If you need to reach your doctor call: Monday-Friday 8:00 am - 4:00 pm at [phone] or toll free [phone].  After clinic hours [phone] to have operator reach doctor.  Bring all of your medication bottles to all your appointments in the pain clinic.  To cancel or reschedule your appointment with Pain Management please remember to call 24 hours in advance to avoid a fee.  Refer to the educational materials which you have been given on: General Risks, I had my Procedure. Discharge Instructions, Post Sedation.  Post Procedure Instructions:  The drugs you were given will stay in your system until tomorrow, so for the next 24 hours you should not drive, make any legal decisions or drink any alcoholic beverages.  You may eat anything you prefer, but it is better to start with liquids then soups and crackers, and gradually work up to solid foods.  Please notify your doctor immediately if you have any unusual bleeding, trouble breathing or pain that is not related to your normal pain.  Depending on the type of procedure that was done, some parts of your body may feel week and/or numb.  This usually clears up by tonight or the next day.  Walk with the use of an assistive device or accompanied by an adult for the 24 hours.  You may use ice on the affected area for the first 24 hours.  Put ice in a Ziploc bag and cover with a towel and place against area 15 minutes on 15 minutes off.  You may switch to heat after 24 hours.Epidural Steroid Injection Patient Information  Description: The epidural space surrounds the nerves as they exit the spinal cord.  In some patients, the nerves can be compressed and inflamed by a bulging disc or a tight spinal canal (spinal stenosis).  By injecting steroids into the epidural space, we can bring irritated nerves into direct contact with a potentially  helpful medication.  These steroids act directly on the irritated nerves and can reduce swelling and inflammation which often leads to decreased pain.  Epidural steroids may be injected anywhere along the spine and from the neck to the low back depending upon the location of your pain.   After numbing the skin with local anesthetic (like Novocaine), a small needle is passed into the epidural space slowly.  You may experience a sensation of pressure while this is being done.  The entire block usually last less than 10 minutes.  Conditions which may be treated by epidural steroids:   Low back and leg pain  Neck and arm pain  Spinal stenosis  Post-laminectomy syndrome  Herpes zoster (shingles) pain  Pain from compression fractures  Preparation for the injection:  1. Do not eat any solid food or dairy products within 8 hours of your appointment.  2. You may drink clear liquids up to 3 hours before appointment.  Clear liquids include water, black coffee, juice or soda.  No milk or cream please. 3. You may take your regular medication, including pain medications, with a sip of water before your appointment  Diabetics should hold regular insulin (if taken separately) and take 1/2 normal NPH dos the morning of the procedure.  Carry some sugar containing items with you to your appointment. 4. A driver must accompany you and be prepared to drive you home after your procedure.  5. Bring all your current medications with your. 6. An IV may be  inserted and sedation may be given at the discretion of the physician.   7. A blood pressure cuff, EKG and other monitors will often be applied during the procedure.  Some patients may need to have extra oxygen administered for a short period. 8. You will be asked to provide medical information, including your allergies, prior to the procedure.  We must know immediately if you are taking blood thinners (like Coumadin/Warfarin)  Or if you are allergic to IV iodine  contrast (dye). We must know if you could possible be pregnant.  Possible side-effects:  Bleeding from needle site  Infection (rare, may require surgery)  Nerve injury (rare)  Numbness & tingling (temporary)  Difficulty urinating (rare, temporary)  Spinal headache ( a headache worse with upright posture)  Light -headedness (temporary)  Pain at injection site (several days)  Decreased blood pressure (temporary)  Weakness in arm/leg (temporary)  Pressure sensation in back/neck (temporary)  Call if you experience:  Fever/chills associated with headache or increased back/neck pain.  Headache worsened by an upright position.  New onset weakness or numbness of an extremity below the injection site  Hives or difficulty breathing (go to the emergency room)  Inflammation or drainage at the infection site  Severe back/neck pain  Any new symptoms which are concerning to you  Please note:  Although the local anesthetic injected can often make your back or neck feel good for several hours after the injection, the pain will likely return.  It takes 3-7 days for steroids to work in the epidural space.  You may not notice any pain relief for at least that one week.  If effective, we will often do a series of three injections spaced 3-6 weeks apart to maximally decrease your pain.  After the initial series, we generally will wait several months before considering a repeat injection of the same type.  If you have any questions, please call [phone] [facility]

## 2021-03-13 NOTE — Progress Notes (Signed)
1130 Alert, able to follow instructions. Skin warm and dry. Continuing to eat/drink. [place] to be alert, ate a meal of sandwich and fruit. Ok to discharge home per Dr. Holley Raring.

## 2021-03-13 NOTE — Progress Notes (Signed)
Safety precautions to be maintained throughout the outpatient stay will include: orient to surroundings, keep bed in low position, maintain call bell within reach at all times, provide assistance with transfer out of bed and ambulation.  

## 2021-03-14 ENCOUNTER — Telehealth: Payer: Self-pay

## 2021-03-14 NOTE — Telephone Encounter (Signed)
Post procedure phone call.  No answer, no answering machine

## 2021-03-14 NOTE — Discharge Instructions (Signed)

## 2021-03-15 ENCOUNTER — Other Ambulatory Visit
Admission: RE | Admit: 2021-03-15 | Discharge: 2021-03-15 | Disposition: A | Discharge status: Home / Self Care | Payer: Medicare Other | Source: Ambulatory Visit | Attending: Ophthalmology | Admitting: Ophthalmology

## 2021-03-15 ENCOUNTER — Other Ambulatory Visit: Payer: Self-pay

## 2021-03-15 DIAGNOSIS — Z20822 Contact with and (suspected) exposure to covid-19: Secondary | ICD-10-CM | POA: Insufficient documentation

## 2021-03-15 DIAGNOSIS — Z01812 Encounter for preprocedural laboratory examination: Secondary | ICD-10-CM | POA: Diagnosis not present

## 2021-03-16 LAB — SARS CORONAVIRUS 2 (TAT 6-24 HRS): SARS Coronavirus 2: NEGATIVE

## 2021-03-19 ENCOUNTER — Encounter
Admission: RE | Disposition: A | Discharge status: Home / Self Care | Payer: Self-pay | Source: Home / Self Care | Attending: Ophthalmology

## 2021-03-19 ENCOUNTER — Ambulatory Visit: Payer: Medicare Other | Admitting: Certified Registered"

## 2021-03-19 ENCOUNTER — Encounter: Payer: Self-pay | Admitting: Ophthalmology

## 2021-03-19 ENCOUNTER — Ambulatory Visit
Admission: RE | Admit: 2021-03-19 | Discharge: 2021-03-19 | Disposition: A | Discharge status: Home / Self Care | Payer: Medicare Other | Attending: Ophthalmology | Admitting: Ophthalmology

## 2021-03-19 ENCOUNTER — Other Ambulatory Visit: Payer: Self-pay

## 2021-03-19 DIAGNOSIS — Z8249 Family history of ischemic heart disease and other diseases of the circulatory system: Secondary | ICD-10-CM | POA: Diagnosis not present

## 2021-03-19 DIAGNOSIS — Z888 Allergy status to other drugs, medicaments and biological substances status: Secondary | ICD-10-CM | POA: Diagnosis not present

## 2021-03-19 DIAGNOSIS — E1136 Type 2 diabetes mellitus with diabetic cataract: Secondary | ICD-10-CM | POA: Insufficient documentation

## 2021-03-19 DIAGNOSIS — Z841 Family history of disorders of kidney and ureter: Secondary | ICD-10-CM | POA: Insufficient documentation

## 2021-03-19 DIAGNOSIS — N183 Chronic kidney disease, stage 3 unspecified: Secondary | ICD-10-CM | POA: Insufficient documentation

## 2021-03-19 DIAGNOSIS — Z833 Family history of diabetes mellitus: Secondary | ICD-10-CM | POA: Diagnosis not present

## 2021-03-19 DIAGNOSIS — I5032 Chronic diastolic (congestive) heart failure: Secondary | ICD-10-CM | POA: Diagnosis not present

## 2021-03-19 DIAGNOSIS — H25811 Combined forms of age-related cataract, right eye: Secondary | ICD-10-CM | POA: Diagnosis not present

## 2021-03-19 DIAGNOSIS — E1122 Type 2 diabetes mellitus with diabetic chronic kidney disease: Secondary | ICD-10-CM | POA: Diagnosis not present

## 2021-03-19 DIAGNOSIS — Z79899 Other long term (current) drug therapy: Secondary | ICD-10-CM | POA: Insufficient documentation

## 2021-03-19 DIAGNOSIS — Z8673 Personal history of transient ischemic attack (TIA), and cerebral infarction without residual deficits: Secondary | ICD-10-CM | POA: Insufficient documentation

## 2021-03-19 DIAGNOSIS — Z794 Long term (current) use of insulin: Secondary | ICD-10-CM | POA: Diagnosis not present

## 2021-03-19 DIAGNOSIS — I130 Hypertensive heart and chronic kidney disease with heart failure and stage 1 through stage 4 chronic kidney disease, or unspecified chronic kidney disease: Secondary | ICD-10-CM | POA: Insufficient documentation

## 2021-03-19 DIAGNOSIS — Z7982 Long term (current) use of aspirin: Secondary | ICD-10-CM | POA: Insufficient documentation

## 2021-03-19 DIAGNOSIS — H2511 Age-related nuclear cataract, right eye: Secondary | ICD-10-CM | POA: Insufficient documentation

## 2021-03-19 DIAGNOSIS — Z8349 Family history of other endocrine, nutritional and metabolic diseases: Secondary | ICD-10-CM | POA: Insufficient documentation

## 2021-03-19 DIAGNOSIS — Z807 Family history of other malignant neoplasms of lymphoid, hematopoietic and related tissues: Secondary | ICD-10-CM | POA: Diagnosis not present

## 2021-03-19 HISTORY — PX: CATARACT EXTRACTION W/PHACO: SHX586

## 2021-03-19 LAB — GLUCOSE, CAPILLARY
Glucose-Capillary: 124 mg/dL — ABNORMAL HIGH (ref 70–99)
Glucose-Capillary: 123 mg/dL — ABNORMAL HIGH (ref 70–99)

## 2021-03-19 SURGERY — PHACOEMULSIFICATION, CATARACT, WITH IOL INSERTION
Anesthesia: Monitor Anesthesia Care | Site: Eye | Laterality: Right

## 2021-03-19 MED ORDER — TETRACAINE HCL 0.5 % OP SOLN
1.0000 [drp] | OPHTHALMIC | Status: DC | PRN
  Administered 2021-03-19 (×3): 1 [drp] via OPHTHALMIC

## 2021-03-19 MED ORDER — PHENYLEPHRINE HCL 10 % OP SOLN
1.0000 [drp] | OPHTHALMIC | Status: DC | PRN
  Administered 2021-03-19: 1 [drp] via OPHTHALMIC

## 2021-03-19 MED ORDER — ARMC OPHTHALMIC DILATING DROPS
1.0000 "application " | OPHTHALMIC | Status: DC | PRN
  Administered 2021-03-19 (×3): 1 via OPHTHALMIC

## 2021-03-19 MED ORDER — LACTATED RINGERS IV SOLN: INTRAVENOUS | Status: DC

## 2021-03-19 MED ORDER — OXYCODONE HCL 5 MG PO TABS: 5.0000 mg | ORAL_TABLET | Freq: Once | ORAL | Status: DC | PRN

## 2021-03-19 MED ORDER — LIDOCAINE HCL (PF) 2 % IJ SOLN
INTRAOCULAR | Status: DC | PRN
  Administered 2021-03-19: 2 mL

## 2021-03-19 MED ORDER — OXYCODONE HCL 5 MG/5ML PO SOLN: 5.0000 mg | Freq: Once | ORAL | Status: DC | PRN

## 2021-03-19 MED ORDER — MIDAZOLAM HCL 2 MG/2ML IJ SOLN
INTRAMUSCULAR | Status: DC | PRN
  Administered 2021-03-19 (×2): 1 mg via INTRAVENOUS

## 2021-03-19 MED ORDER — NA CHONDROIT SULF-NA HYALURON 40-17 MG/ML IO SOLN
INTRAOCULAR | Status: DC | PRN
  Administered 2021-03-19: 1 mL via INTRAOCULAR

## 2021-03-19 MED ORDER — PHENYLEPHRINE-KETOROLAC 1-0.3 % IO SOLN
INTRAOCULAR | Status: DC | PRN
  Administered 2021-03-19: .4 mL via OPHTHALMIC
  Administered 2021-03-19: 59 mL via OPHTHALMIC

## 2021-03-19 MED ORDER — BRIMONIDINE TARTRATE-TIMOLOL 0.2-0.5 % OP SOLN
OPHTHALMIC | Status: DC | PRN
  Administered 2021-03-19: 1 [drp] via OPHTHALMIC

## 2021-03-19 MED ORDER — MOXIFLOXACIN HCL 0.5 % OP SOLN
OPHTHALMIC | Status: DC | PRN
  Administered 2021-03-19: 0.2 mL via OPHTHALMIC

## 2021-03-19 MED ORDER — FENTANYL CITRATE (PF) 100 MCG/2ML IJ SOLN
INTRAMUSCULAR | Status: DC | PRN
  Administered 2021-03-19: 50 ug via INTRAVENOUS

## 2021-03-19 SURGICAL SUPPLY — 16 items
CANNULA ANT/CHMB 27GA (MISCELLANEOUS) ×4 IMPLANT
GLOVE BIOGEL M 8.0 STRL (GLOVE) ×2 IMPLANT
GLOVE SURG TRIUMPH 8.0 PF LTX (GLOVE) ×2 IMPLANT
GOWN STRL REUS W/ TWL LRG LVL3 (GOWN DISPOSABLE) ×2 IMPLANT
GOWN STRL REUS W/TWL LRG LVL3 (GOWN DISPOSABLE) ×4
LENS IOL TECNIS EYHANCE 21.0 (Intraocular Lens) ×2 IMPLANT
MARKER SKIN DUAL TIP RULER LAB (MISCELLANEOUS) ×2 IMPLANT
NEEDLE FILTER BLUNT 18X 1/2SAF (NEEDLE) ×1
NEEDLE FILTER BLUNT 18X1 1/2 (NEEDLE) ×1 IMPLANT
PACK EYE AFTER SURG (MISCELLANEOUS) ×2 IMPLANT
PACK OPTHALMIC (MISCELLANEOUS) ×2 IMPLANT
PACK PORFILIO (MISCELLANEOUS) ×2 IMPLANT
SYR 3ML LL SCALE MARK (SYRINGE) ×2 IMPLANT
SYR TB 1ML LUER SLIP (SYRINGE) ×4 IMPLANT
WATER STERILE IRR 250ML POUR (IV SOLUTION) ×2 IMPLANT
WIPE NON LINTING 3.25X3.25 (MISCELLANEOUS) ×2 IMPLANT

## 2021-03-19 NOTE — H&P (Signed)
[place]   Primary Care Physician:  [name], NP Ophthalmologist: Dr. George Ina  Pre-Procedure History & Physical: HPI:  Tonya [name] is a 75 y.o. female here for cataract surgery.   Past Medical History:  Diagnosis Date  . (HFpEF) heart failure with preserved ejection fraction ([place])    a. 05/2017 Echo: EF 55-60%, Gr1 DD, mildly dil LA.  . CKD (chronic kidney disease), stage III ([place])   . Diabetes mellitus without complication ([place])   . Hyperlipidemia   . Hypertension   . Stroke [facility])    a. 08/2017.  . Wears dentures    partial upper    Past Surgical History:  Procedure Laterality Date  . CATARACT EXTRACTION W/PHACO Left 02/05/2021   Procedure: CATARACT EXTRACTION PHACO AND INTRAOCULAR LENS PLACEMENT (IOC) LEFT DIABETIC 5.47 00:51.4;  Surgeon: Birder Robson, MD;  Location: [place];  Service: Ophthalmology;  Laterality: Left;  Diabetic - insulin and oral meds  . COLONOSCOPY  12/15/2006  . COLONOSCOPY WITH PROPOFOL N/A 06/06/2019   Procedure: COLONOSCOPY WITH PROPOFOL;  Surgeon: [name], MD;  Location: [facility] ENDOSCOPY;  Service: Gastroenterology;  Laterality: N/A;  . COLONOSCOPY WITH PROPOFOL N/A 01/16/2020   Procedure: COLONOSCOPY WITH BIOPSY;  Surgeon: [name], MD;  Location: [place];  Service: Endoscopy;  Laterality: N/A;  Diabetic - insulin and oral meds  . POLYPECTOMY N/A 01/16/2020   Procedure: POLYPECTOMY;  Surgeon: [name], MD;  Location: [place];  Service: Endoscopy;  Laterality: N/A;  . TUBAL LIGATION      Prior to Admission medications   Medication Sig Start Date End Date Taking? Authorizing Provider  aspirin 81 MG tablet Take 81 mg by mouth daily.   Yes [provider]  carvedilol (COREG) 3.125 MG tablet TAKE 1 TABLET BY MOUTH  TWICE DAILY WITH A MEAL 02/04/21  Yes Gollan, Kathlene November, MD  clopidogrel (PLAVIX) 75 MG tablet TAKE 1 TABLET BY MOUTH  DAILY 08/21/20  Yes Cannady, [name] T, NP  ferrous  sulfate 325 (65 FE) MG EC tablet Take 1 tablet (325 mg total) by mouth 3 (three) times daily with meals. 09/27/19  Yes Earlie Server, MD  furosemide (LASIX) 40 MG tablet TAKE 1 TABLET BY MOUTH EVERY DAY 01/14/21  Yes Cannady, [name] T, NP  HUMALOG KWIKPEN 100 UNIT/ML KiwkPen 6 Units. Take 6 units before breakfast, lunch, supper if glucose greater than 70 05/20/18  Yes [provider]  insulin detemir (LEVEMIR) 100 UNIT/ML injection Inject 10 Units into the skin every morning.    Yes [provider]  NOVOFINE 32G X 6 MM MISC USE AS DIRECTED THREE TIMES DAILY WITH HUMALOG 06/19/20  Yes Cannady, [name] T, NP  olmesartan (BENICAR) 40 MG tablet Take 1 tablet (40 mg total) by mouth daily. 08/07/20  Yes Cannady, [name] T, NP  omeprazole (PRILOSEC) 20 MG capsule TAKE 1 CAPSULE BY MOUTH  TWICE DAILY 08/21/20  Yes Cannady, [name] T, NP  pioglitazone (ACTOS) 30 MG tablet Take 30 mg by mouth daily. 10/11/19  Yes [provider]  rosuvastatin (CRESTOR) 5 MG tablet TAKE 1 TABLET BY MOUTH  DAILY 02/04/21  Yes Gollan, Kathlene November, MD  Vitamin D, Cholecalciferol, 50 MCG (2000 UT) CAPS Take 2,000 Units by mouth daily.   Yes [provider]  docusate sodium (COLACE) 100 MG capsule Take 100 mg by mouth daily as needed for mild constipation. Patient not taking: No sig reported    [provider]    Allergies  as of 03/05/2021 - Review Complete 03/05/2021  Allergen Reaction Noted  . Atorvastatin  09/03/2017  . Gabapentin Other (See Comments) 06/30/2018  . Pravastatin  09/03/2017  . Pregabalin  01/09/2020  . Trulicity [dulaglutide] Hives 05/01/2015    Family History  Problem Relation Age of Onset  . Diabetes Mother   . Stroke Mother   . Hyperlipidemia Father   . Hypertension Father   . Diabetes Sister   . Diabetes Brother   . Gout Son   . Diabetes Brother   . Kidney disease Son   . Lymphoma Sister   . Heart attack Sister   . Breast cancer Neg Hx     Social History    Socioeconomic History  . Marital status: Married    Spouse name: Not on file  . Number of children: Not on file  . Years of education: Not on file  . Highest education level: GED or equivalent  Occupational History  . Occupation: retired  Tobacco Use  . Smoking status: Never Smoker  . Smokeless tobacco: Never Used  Vaping Use  . Vaping Use: Never used  Substance and Sexual Activity  . Alcohol use: No    Alcohol/week: 0.0 standard drinks  . Drug use: No  . Sexual activity: Yes  Other Topics Concern  . Not on file  Social History Narrative  . Not on file   Social Determinants of Health   Financial Resource Strain: Low Risk   . Difficulty of Paying Living Expenses: Not hard at all  Food Insecurity: No Food Insecurity  . Worried About Charity fundraiser in the Last Year: Never true  . Ran Out of Food in the Last Year: Never true  Transportation Needs: No Transportation Needs  . Lack of Transportation (Medical): No  . Lack of Transportation (Non-Medical): No  Physical Activity: Inactive  . Days of Exercise per Week: 0 days  . Minutes of Exercise per Session: 0 min  Stress: No Stress Concern Present  . Feeling of Stress : Not at all  Social Connections: Socially Integrated  . Frequency of Communication with Friends and Family: More than three times a week  . Frequency of Social Gatherings with Friends and Family: More than three times a week  . Attends Religious Services: More than 4 times per year  . Active Member of Clubs or Organizations: Yes  . Attends Archivist Meetings: More than 4 times per year  . Marital Status: Married  Human resources officer Violence: Not At Risk  . Fear of Current or Ex-Partner: No  . Emotionally Abused: No  . Physically Abused: No  . Sexually Abused: No    Review of Systems: See HPI, otherwise negative ROS  Physical Exam: BP 131/75   Pulse 87   Temp (!) 96.6 F (35.9 C) (Temporal)   Resp 16   Ht 5\' 7"  (1.702 m)   Wt 77.1  kg   LMP  (LMP Unknown)   SpO2 100%   BMI 26.63 kg/m  General:   Alert,  pleasant and cooperative in NAD Head:  Normocephalic and atraumatic. Respiratory:  Normal work of breathing. Cardiovascular:  RRR  Impression/Plan: SOPHIANA MILANESE is here for cataract surgery.  Risks, benefits, limitations, and alternatives regarding cataract surgery have been reviewed with the patient.  Questions have been answered.  All parties agreeable.   Birder Robson, MD  03/19/2021, 9:09 AM

## 2021-03-19 NOTE — Anesthesia Procedure Notes (Signed)
Procedure Name: MAC Date/Time: 03/19/2021 9:34 AM Performed by: Jeannene Patella, CRNA Pre-anesthesia Checklist: Patient identified, Emergency Drugs available, Suction available, Timeout performed and Patient being monitored Patient Re-evaluated:Patient Re-evaluated prior to induction Oxygen Delivery Method: Nasal cannula Placement Confirmation: positive ETCO2

## 2021-03-19 NOTE — Anesthesia Preprocedure Evaluation (Signed)
Anesthesia Evaluation  Patient identified by MRN, date of birth, ID band Patient awake    Reviewed: Allergy & Precautions, H&P , NPO status , Patient's Chart, lab work & pertinent test results  History of Anesthesia Complications Negative for: history of anesthetic complications  Airway Mallampati: III  TM Distance: >3 FB Neck ROM: full    Dental  (+) Upper Dentures, Chipped,    Pulmonary neg pulmonary ROS,    Pulmonary exam normal        Cardiovascular Exercise Tolerance: Good hypertension, +CHF (preserved EF)  Normal cardiovascular exam     Neuro/Psych CVA (2018) negative psych ROS   GI/Hepatic Neg liver ROS, GERD  Controlled,  Endo/Other  diabetes  Renal/GU CRFRenal disease (ckd3)  negative genitourinary   Musculoskeletal  (+) Arthritis ,   Abdominal Normal abdominal exam  (+)   Peds  Hematology negative hematology ROS (+)   Anesthesia Other Findings Covid: NEG.  echo: 2020: ef=60%;   Last plavix: 10 days ago; Last aspirin: 4/4  Reproductive/Obstetrics                             Anesthesia Physical  Anesthesia Plan  ASA: III  Anesthesia Plan: MAC   Post-op Pain Management:    Induction: Intravenous  PONV Risk Score and Plan: 2 and Treatment may vary due to age or medical condition, TIVA and Midazolam  Airway Management Planned: Nasal Cannula and Natural Airway  Additional Equipment: None  Intra-op Plan:   Post-operative Plan:   Informed Consent: I have reviewed the patients History and Physical, chart, labs and discussed the procedure including the risks, benefits and alternatives for the proposed anesthesia with the patient or authorized representative who has indicated his/her understanding and acceptance.     Dental Advisory Given  Plan Discussed with: CRNA  Anesthesia Plan Comments:         Anesthesia Quick Evaluation

## 2021-03-19 NOTE — Transfer of Care (Signed)
Immediate Anesthesia Transfer of Care Note  Patient: Tonya [name]  Procedure(s) Performed: CATARACT EXTRACTION PHACO AND INTRAOCULAR LENS PLACEMENT (IOC) RIGHT DIABETIC 6.09 00:39.6 (Right Eye)  Patient Location: PACU  Anesthesia Type: MAC  Level of Consciousness: awake, alert  and patient cooperative  Airway and Oxygen Therapy: Patient Spontanous Breathing and Patient connected to supplemental oxygen  Post-op Assessment: Post-op Vital signs reviewed, Patient's Cardiovascular Status Stable, Respiratory Function Stable, Patent Airway and No signs of Nausea or vomiting  Post-op Vital Signs: Reviewed and stable  Complications: No complications documented.

## 2021-03-19 NOTE — Anesthesia Postprocedure Evaluation (Signed)
Anesthesia Post Note  Patient: Tonya [name]  Procedure(s) Performed: CATARACT EXTRACTION PHACO AND INTRAOCULAR LENS PLACEMENT (IOC) RIGHT DIABETIC 6.09 00:39.6 (Right Eye)     Patient location during evaluation: PACU Anesthesia Type: MAC Level of consciousness: awake and alert Pain management: pain level controlled Vital Signs Assessment: post-procedure vital signs reviewed and stable Respiratory status: spontaneous breathing, nonlabored ventilation, respiratory function stable and patient connected to nasal cannula oxygen Cardiovascular status: stable and blood pressure returned to baseline Postop Assessment: no apparent nausea or vomiting Anesthetic complications: no   No complications documented.  Fidel Levy

## 2021-03-19 NOTE — Op Note (Signed)
PREOPERATIVE DIAGNOSIS:  Nuclear sclerotic cataract of the right eye.   POSTOPERATIVE DIAGNOSIS:  Cataract   OPERATIVE PROCEDURE:@   SURGEON:  Birder Robson, MD.   ANESTHESIA:  No anesthesia staff entered.  1.      Managed anesthesia care. 2.      0.75ml of Shugarcaine was instilled in the eye following the paracentesis. 3.  Omidria was placed in the infusion bag and an aliquot placed in the [facility]   COMPLICATIONS:  None.   TECHNIQUE:   Stop and chop   DESCRIPTION OF PROCEDURE:  The patient was examined and consented in the preoperative holding area where the aforementioned topical anesthesia was applied to the right eye and then brought back to the Operating Room where the right eye was prepped and draped in the usual sterile ophthalmic fashion and a lid speculum was placed. A paracentesis was created with the side port blade and the anterior chamber was filled with viscoelastic. A near clear corneal incision was performed with the steel keratome. A continuous curvilinear capsulorrhexis was performed with a cystotome followed by the capsulorrhexis forceps. Hydrodissection and hydrodelineation were carried out with BSS on a blunt cannula. The lens was removed in a stop and chop  technique and the remaining cortical material was removed with the irrigation-aspiration handpiece. The capsular bag was inflated with viscoelastic and the Technis [id number]  lens was placed in the capsular bag without complication. The remaining viscoelastic was removed from the eye with the irrigation-aspiration handpiece. The wounds were hydrated. The anterior chamber was flushed with BSS and the eye was inflated to physiologic pressure. 0.5ml of Vigamox was placed in the anterior chamber. The wounds were found to be water tight. The eye was dressed with Combigan. The patient was given protective glasses to wear throughout the day and a shield with which to sleep tonight. The patient was also given drops with which to begin  a drop regimen today and will follow-up with me in one day. Implant Name Type Inv. Item Serial No. Manufacturer Lot No. LRB No. Used Action  LENS IOL TECNIS EYHANCE 21.0 - [id number] Intraocular Lens LENS IOL TECNIS EYHANCE 21.0 [id number] JOHNSON   Right 1 Implanted   Procedure(s): CATARACT EXTRACTION PHACO AND INTRAOCULAR LENS PLACEMENT (IOC) RIGHT DIABETIC 6.09 00:39.6 (Right)  Electronically signed: Birder Robson 03/19/2021 9:45 AM

## 2021-03-20 ENCOUNTER — Encounter: Payer: Self-pay | Admitting: Ophthalmology

## 2021-03-21 DIAGNOSIS — E1121 Type 2 diabetes mellitus with diabetic nephropathy: Secondary | ICD-10-CM | POA: Diagnosis not present

## 2021-03-21 DIAGNOSIS — E1122 Type 2 diabetes mellitus with diabetic chronic kidney disease: Secondary | ICD-10-CM | POA: Diagnosis not present

## 2021-03-21 DIAGNOSIS — E113393 Type 2 diabetes mellitus with moderate nonproliferative diabetic retinopathy without macular edema, bilateral: Secondary | ICD-10-CM | POA: Diagnosis not present

## 2021-03-21 DIAGNOSIS — Z794 Long term (current) use of insulin: Secondary | ICD-10-CM | POA: Diagnosis not present

## 2021-03-21 DIAGNOSIS — N1832 Chronic kidney disease, stage 3b: Secondary | ICD-10-CM | POA: Diagnosis not present

## 2021-03-27 DIAGNOSIS — N184 Chronic kidney disease, stage 4 (severe): Secondary | ICD-10-CM | POA: Diagnosis not present

## 2021-04-04 ENCOUNTER — Encounter: Payer: Self-pay | Admitting: Student in an Organized Health Care Education/Training Program

## 2021-04-04 ENCOUNTER — Telehealth: Payer: Self-pay

## 2021-04-04 NOTE — Telephone Encounter (Signed)
Answering machine does not have voicemail.  Unable to leave message.

## 2021-04-05 ENCOUNTER — Telehealth: Payer: Self-pay | Admitting: General Practice

## 2021-04-05 ENCOUNTER — Ambulatory Visit (INDEPENDENT_AMBULATORY_CARE_PROVIDER_SITE_OTHER): Payer: Medicare Other | Admitting: General Practice

## 2021-04-05 DIAGNOSIS — I10 Essential (primary) hypertension: Secondary | ICD-10-CM

## 2021-04-05 DIAGNOSIS — G8929 Other chronic pain: Secondary | ICD-10-CM

## 2021-04-05 DIAGNOSIS — E1165 Type 2 diabetes mellitus with hyperglycemia: Secondary | ICD-10-CM | POA: Diagnosis not present

## 2021-04-05 DIAGNOSIS — I5032 Chronic diastolic (congestive) heart failure: Secondary | ICD-10-CM | POA: Diagnosis not present

## 2021-04-05 DIAGNOSIS — E1140 Type 2 diabetes mellitus with diabetic neuropathy, unspecified: Secondary | ICD-10-CM | POA: Diagnosis not present

## 2021-04-05 NOTE — Patient Instructions (Signed)
Visit Information  PATIENT GOALS: Goals Addressed            This Visit's Progress   . RNCM: Manage Chronic Pain       Timeframe:  Short-Term Goal Priority:  High Start Date:   02-15-2021                          Expected End Date:         07-13-2021              Follow Up Date 06-07-2021   - call for medicine refill 2 or 3 days before it runs out - develop a personal pain management plan - keep track of prescription refills - plan exercise or activity when pain is best controlled - prioritize tasks for the day - track times pain is worst and when it is best - track what makes the pain worse and what makes it better - use ice or heat for pain relief - work slower and less intense when having pain    Why is this important?    Day-to-day life can be hard when you have chronic pain.   Pain medicine is just one piece of the treatment puzzle.   You can try these action steps to help you manage your pain.    Notes: Pain onset x 2 days ago, denies falls or injury. 04-05-2021: Had a procedure recently and is doing well. Having tingling down her legs but not bad. Will discuss with the specialist at appointment on 04-09-2021     . RNCM: Monitor and Manage My Blood Sugar-Diabetes Type 2       Timeframe:  Long-Range Goal Priority:  High Start Date:  02-15-2021                           Expected End Date:  06-07-2022                    Follow Up Date 06-07-2021   - check blood sugar at prescribed times - check blood sugar before and after exercise - check blood sugar if I feel it is too high or too low - enter blood sugar readings and medication or insulin into daily log - take the blood sugar log to all doctor visits - take the blood sugar meter to all doctor visits    Why is this important?    Checking your blood sugar at home helps to keep it from getting very high or very low.   Writing the results in a diary or log helps the doctor know how to care for you.   Your blood sugar  log should have the time, date and the results.   Also, write down the amount of insulin or other medicine that you take.   Other information, like what you ate, exercise done and how you were feeling, will also be helpful.     Notes: Had not been consistently checking blood sugars but has started back now. Is checking blood sugars consistently now. The patient states the readings are in the 110's      Patient Care Plan: RNCM: Heart Failure (Adult)    Problem Identified: RNCM: Symptom Exacerbation (Heart Failure)   Priority: Medium    Long-Range Goal: RNCM: Symptom Exacerbation Prevented or Minimized   Priority: Medium  Note:   Current Barriers:  [name] Knowledge deficits related to basic heart  failure pathophysiology and self care management . Lacks social connections . Does not contact provider office for questions/concerns . Lack of scale in home . Financial strain Nurse Case Manager Clinical Goal(s):   patient will weigh self daily and record  patient will verbalize understanding of Heart Failure Action Plan and when to call doctor  patient will take all Heart Failure mediations as prescribed Interventions:  . Collaboration with [name], NP regarding development and update of comprehensive plan of care as evidenced by provider attestation and co-signature . Inter-disciplinary care team collaboration (see longitudinal plan of care) . Basic overview and discussion of pathophysiology of Heart Failure . Provided written and verbal education on low sodium diet. 04-05-2021: Endorses compliance with heart healthy/ADA diet  . Reviewed Heart Failure Action Plan in depth and provided written copy . Assessed for scales in home- has scales . Discussed importance of daily weight . Reviewed role of diuretics in prevention of fluid overload Patient Goals/Self-Care Activities:  Takes Heart Failure Medications as prescribed. 04-05-2021: Compliant with medications  Weighs daily and  record (notifying MD of 3 lb weight gain over night or 5 lb in a week) Verbalizes understanding of and follows CHF Action Plan Adheres to low sodium diet  - Take Heart Failure Medications as prescribed - Weigh daily and record (notify MD with 3 lb weight gain over night or 5 lb in a week) - Follow CHF Action Plan - Adhere to low sodium diet - barriers to lifestyle changes reviewed and addressed - barriers to treatment reviewed and addressed - cognitive screening completed and reviewed - depression screen reviewed - health literacy screening completed or reviewed - healthy lifestyle promoted - medication-adherence assessment completed - rescue (action) plan developed - rescue (action) plan reviewed - self-awareness of signs/symptoms of worsening disease encouraged Follow Up Plan: Telephone follow up appointment with care management team member scheduled for: 06-07-2021 at 1 pm   Task: RNCM: Identify and Minimize Risk of Heart Failure Exacerbation   Note:   Care Management Activities:    - barriers to lifestyle changes reviewed and addressed - barriers to treatment reviewed and addressed - cognitive screening completed and reviewed - depression screen reviewed - health literacy screening completed or reviewed - healthy lifestyle promoted - medication-adherence assessment completed - rescue (action) plan developed - rescue (action) plan reviewed - self-awareness of signs/symptoms of worsening disease encouraged       Patient Care Plan: RNCM: Chronic Pain (Adult)    Problem Identified: RNCM: Pain Management Plan (Chronic Pain)   Priority: High  Onset Date: 02/12/2021    Goal: RNCM: Pain Management Plan Developed   Start Date: 02/12/2021  Priority: High  Note:   Current Barriers:  [name] Knowledge Deficits related to managing acute/chronic pain . Non-adherence to scheduled provider appointments . Non-adherence to prescribed medication regimen . Difficulty obtaining  medications . Chronic Disease Management support and education needs related to chronic pain . Unable to independently manage pain and discomfort to lower back radiating down left leg . Lacks social connections . Unable to perform IADLs independently . Does not contact provider office for questions/concerns Nurse Case Manager Clinical Goal(s):  . patient will verbalize understanding of plan for managing pain . patient will attend all scheduled medical appointments: 07-29-2021 with pcp, may need sooner appointment, specialist to evaluate pain on 03-05-2021. Sees pain specialist on 04-09-2021 for follow up post procedure  . patient will demonstrate use of different relaxation  skills and/or diversional activities to assist with pain  reduction (distraction, imagery, relaxation, massage, acupressure, TENS, heat, and cold application . patient will report pain at a level less than 3 to 4 on a 10-10 rating scale . patient will use pharmacological and nonpharmacological pain relief strategies . patient will verbalize acceptable level of pain relief and ability to engage in desired activities . patient will engage in desired activities without an increase in pain level Interventions:  . Collaboration with [name], NP regarding development and update of comprehensive plan of care as evidenced by provider attestation and co-signature . Inter-disciplinary care team collaboration (see longitudinal plan of care) . - careful application of heat or ice encouraged . - deep breathing, relaxation and mindfulness use promoted . - effectiveness of pharmacologic therapy monitored . - medication-induced side effects managed . - misuse of pain medication assessed . - motivation and barriers to change assessed and addressed . - mutually acceptable comfort goal set . - pain assessed. 04-05-2021: The patient states her pain level is less than a 3. Procedure has worked well for her. She is having tingling in her  legs but will discuss with the specialist at upcoming appointment on 04-09-2021.  . - pain treatment goals reviewed. 04-05-2021: The patient states she is feeling better. She is thankful the procedure has been helpful. Will see the specialist next week.  . - premedication prior to activity encouraged . Evaluation of current treatment plan related to pain in back going down left leg and patient's adherence to plan as established by provider. . Advised patient to call the office for worsening sx/sx of pain or changes noted  . Provided education to patient re: alternative pain relief measures. The patient tried heat application last night but states it was not helpful. States the pain is going down into her left leg from her back.  . Reviewed medications with patient and discussed compliance. The patient is taking a prednisone dose pack and Robaxin. She feels the Robaxin or the prednisone is not helping  . Collaborated with pcp regarding the patient states she can not see the pain specialist until 03-05-2021, ask about getting a "shot", will discuss with pcp for recommendations. 04-05-2021: Had procedure and is doing well post procedure. Will continue to monitor.  . Discussed plans with patient for ongoing care management follow up and provided patient with direct contact information for care management team . Allow patient to maintain a diary of pain ratings, timing, precipitating events, medications, treatments, and what works best to relieve pain,  . Refer to support groups and self-help groups . Educate patient about the use of pharmacological interventions for pain management- antianxiety, antidepressants, NSAIDS, opioid analgesics,  . Explain the importance of lifestyle modifications to effective pain management  Patient Goals/Self Care Activities:  . - mutually acceptable comfort goal set . - pain assessed . - pain management plan developed . - pain treatment goals reviewed . - patient response to  treatment assessed . - sharing of pain management plan with teachers and other caregivers encouraged . Self-administers medications as prescribed . Attends all scheduled provider appointments . Calls pharmacy for medication refills . Calls provider office for new concerns or questions Follow Up Plan: Telephone follow up appointment with care management team member scheduled for: 06-07-2021 at 1 pm     Task: RNCM: Partner to Develop Chronic Pain Management Plan   Note:   Care Management Activities:    - mutually acceptable comfort goal set - pain assessed - pain management plan developed - pain  treatment goals reviewed - patient response to treatment assessed - sharing of pain management plan with teachers and other caregivers encouraged       Patient Care Plan: RNCM: Hypertension (Adult)    Problem Identified: RNCM: Hypertension (Hypertension)   Priority: Medium    Long-Range Goal: RNCM: Hypertension Monitored   Priority: Medium  Note:   Objective:  . Last practice recorded BP readings:  . BP Readings from Last 3 Encounters: .  03/19/21 . 130/72 .  03/13/21 . 139/88 .  03/05/21 . 102/66 .    [name] Most recent eGFR/CrCl: No results found for: EGFR  No components found for: CRCL Current Barriers:  [name] Knowledge Deficits related to basic understanding of hypertension pathophysiology and self care management . Knowledge Deficits related to understanding of medications prescribed for management of hypertension . Limited Social Support . Lacks social connections . Unable to perform IADLs independently . Does not contact provider office for questions/concerns Case Manager Clinical Goal(s):  [name] Over the next 120 days, patient will verbalize understanding of plan for hypertension management . Over the next 120 days, patient will attend all scheduled medical appointments: 07-29-2021 . Over the next 120 days, patient will demonstrate improved adherence to prescribed treatment plan for  hypertension as evidenced by taking all medications as prescribed, monitoring and recording blood pressure as directed, adhering to low sodium/DASH diet . Over the next 120 days, patient will demonstrate improved health management independence as evidenced by checking blood pressure as directed and notifying PCP if SBP>160 or DBP > 90, taking all medications as prescribe, and adhering to a low sodium diet as discussed. . Over the next 120 days, patient will verbalize basic understanding of hypertension disease process and self health management plan as evidenced by compliance with medications, compliance with diet, and working with CCM team to manage health and well being  Interventions:  . Collaboration with [name], NP regarding development and update of comprehensive plan of care as evidenced by provider attestation and co-signature . Inter-disciplinary care team collaboration (see longitudinal plan of care) . Evaluation of current treatment plan related to hypertension self management and patient's adherence to plan as established by provider. 04-05-2021: The patient is stable and has no new concerns with her blood pressure. Adheres to heart healthy diet and denies any issues with medications compliance. Will continue to monitor . Provided education to patient re: stroke prevention, s/s of heart attack and stroke, DASH diet, complications of uncontrolled blood pressure . Reviewed medications with patient and discussed importance of compliance . Discussed plans with patient for ongoing care management follow up and provided patient with direct contact information for care management team . Advised patient, providing education and rationale, to monitor blood pressure daily and record, calling PCP for findings outside established parameters.  . Reviewed scheduled/upcoming provider appointments including: 07-29-2021 Patient Goals: - blood pressure trends reviewed - depression screen reviewed -  home or ambulatory blood pressure monitoring encouraged Self-Care Activities: - Self administers medications as prescribed Attends all scheduled provider appointments Calls provider office for new concerns, questions, or BP outside discussed parameters Checks BP and records as discussed Follows a low sodium diet/DASH diet Follow Up Plan: Telephone follow up appointment with care management team member scheduled for:  06-07-2021 at 1 pm   Task: RNCM: Identify and Monitor Blood Pressure Elevation   Note:   Care Management Activities:    - blood pressure trends reviewed - depression screen reviewed - home or ambulatory blood pressure monitoring  encouraged       Patient Care Plan: RNCM: Diabetes Type 2 (Adult)    Problem Identified: RNCM: Glycemic Management (Diabetes, Type 2)   Priority: High    Long-Range Goal: RNCM: Glycemic Management Optimized   Priority: High  Note:   Objective:  Lab Results  Component Value Date   HGBA1C 8.2 (H) 01/29/2021 .   Lab Results  Component Value Date   CREATININE 2.45 (H) 02/12/2021   CREATININE 2.60 (H) 01/29/2021   CREATININE 2.56 (H) 10/25/2020 .   [name] No results found for: EGFR Current Barriers:  [name] Knowledge Deficits related to medications used for management of diabetes . Does not use cbg meter  . Limited Social Support . Unable to independently manage blood sugars . Unable to self administer medications as prescribed . Does not adhere to provider recommendations re: checking blood sugars as recommended by the provider  . Does not adhere to prescribed medication regimen . Lacks social connections Case Manager Clinical Goal(s):  . patient will demonstrate improved adherence to prescribed treatment plan for diabetes self care/management as evidenced by: daily monitoring and recording of CBG  adherence to ADA/ carb modified diet exercise 3/4 days/week adherence to prescribed medication regimen contacting provider for new or worsened  symptoms or questions Interventions:  . Collaboration with [name], NP regarding development and update of comprehensive plan of care as evidenced by provider attestation and co-signature . Inter-disciplinary care team collaboration (see longitudinal plan of care) . Provided education to patient about basic DM disease process . Reviewed medications with patient and discussed importance of medication adherence . Discussed plans with patient for ongoing care management follow up and provided patient with direct contact information for care management team . Provided patient with written educational materials related to hypo and hyperglycemia and importance of correct treatment. 04-05-2021: The patient denies any highs or lows. The patient states that she is having readings in the 110's. The patient is checking blood sugars regularly at this time.  . Reviewed scheduled/upcoming provider appointments including: 07-29-2021 at 11 am . Advised patient, providing education and rationale, to check cbg bid and record, calling pcp for findings outside established parameters.   . Review of patient status, including review of consultants reports, relevant laboratory and other test results, and medications completed. Self-Care Activities - UNABLE to independently manage DM Self administers oral medications as prescribed Self administers insulin as prescribed Attends all scheduled provider appointments Checks blood sugars as prescribed and utilize hyper and hypoglycemia protocol as needed Adheres to prescribed ADA/carb modified Patient Goals: -- barriers to adherence to treatment plan identified - blood glucose monitoring encouraged - blood glucose readings reviewed - encourage participation in diabetes education classes - individualized medical nutrition therapy provided - resources required to improve adherence to care identified - self-awareness of signs/symptoms of hypo or hyperglycemia  encouraged - use of blood glucose monitoring log promoted Follow Up Plan: Telephone follow up appointment with care management team member scheduled for: 06-07-2021 at 1 pm   Task: RNCM: Alleviate Barriers to Glycemic Management   Note:   Care Management Activities:    - barriers to adherence to treatment plan identified - blood glucose monitoring encouraged - blood glucose readings reviewed - encourage participation in diabetes education classes - individualized medical nutrition therapy provided - resources required to improve adherence to care identified - self-awareness of signs/symptoms of hypo or hyperglycemia encouraged - use of blood glucose monitoring log promoted  Patient verbalizes understanding of instructions provided today and agrees to view in [place].   Telephone follow up appointment with care management team member scheduled for: 06-07-2021 at 1 pm  [name] RN, MSN, [facility] Mobile: [phone]

## 2021-04-05 NOTE — Chronic Care Management (AMB) (Signed)
Chronic Care Management   CCM RN Visit Note  04/05/2021 Name: Tonya [name] MRN: [id number] DOB: 1946-07-31  Subjective: Tonya [name] is a 75 y.o. year old female who is a primary care patient of Cannady, [name], NP. The care management team was consulted for assistance with disease management and care coordination needs.    Engaged with patient by telephone for follow up visit in response to provider referral for case management and/or care coordination services.   Consent to Services:  The patient was given information about Chronic Care Management services, agreed to services, and gave verbal consent prior to initiation of services.  Please see initial visit note for detailed documentation.   Patient agreed to services and verbal consent obtained.   Assessment: Review of patient past medical history, allergies, medications, health status, including review of consultants reports, laboratory and other test data, was performed as part of comprehensive evaluation and provision of chronic care management services.   SDOH (Social Determinants of Health) assessments and interventions performed:    CCM Care Plan  Allergies  Allergen Reactions  . Atorvastatin     Myalgias   . Gabapentin Other (See Comments)    Speech impairment   . Pravastatin     Myalgias   . Pregabalin     Speech impairment  . Trulicity [Dulaglutide] Hives    Outpatient Encounter Medications as of 04/05/2021  Medication Sig Note  . aspirin 81 MG tablet Take 81 mg by mouth daily.   . carvedilol (COREG) 3.125 MG tablet TAKE 1 TABLET BY MOUTH  TWICE DAILY WITH A MEAL   . clopidogrel (PLAVIX) 75 MG tablet TAKE 1 TABLET BY MOUTH  DAILY   . docusate sodium (COLACE) 100 MG capsule Take 100 mg by mouth daily as needed for mild constipation. (Patient not taking: No sig reported)   . ferrous sulfate 325 (65 FE) MG EC tablet Take 1 tablet (325 mg total) by mouth 3 (three) times daily with meals. 01/11/2020: Taking  twice daily   . furosemide (LASIX) 40 MG tablet TAKE 1 TABLET BY MOUTH EVERY DAY   . HUMALOG KWIKPEN 100 UNIT/ML KiwkPen 6 Units. Take 6 units before breakfast, lunch, supper if glucose greater than 70 03/09/2020: 6 units  . insulin detemir (LEVEMIR) 100 UNIT/ML injection Inject 10 Units into the skin every morning.    [name] NOVOFINE 32G X 6 MM MISC USE AS DIRECTED THREE TIMES DAILY WITH HUMALOG   . olmesartan (BENICAR) 40 MG tablet Take 1 tablet (40 mg total) by mouth daily.   [name] omeprazole (PRILOSEC) 20 MG capsule TAKE 1 CAPSULE BY MOUTH  TWICE DAILY   . pioglitazone (ACTOS) 30 MG tablet Take 30 mg by mouth daily.   . rosuvastatin (CRESTOR) 5 MG tablet TAKE 1 TABLET BY MOUTH  DAILY   . Vitamin D, Cholecalciferol, 50 MCG (2000 UT) CAPS Take 2,000 Units by mouth daily.    No facility-administered encounter medications on file as of 04/05/2021.    Patient Active Problem List   Diagnosis Date Noted  . Neuropathic pain 06/07/2020  . Lumbar degenerative disc disease 06/07/2020  . Lumbar facet arthropathy 06/07/2020  . Chronic pain syndrome 04/18/2020  . Sacroiliac joint pain 04/18/2020  . Chronic heart failure with preserved ejection fraction (HFpEF) ([place]) 01/11/2020  . Grade I diastolic dysfunction 21/30/8657  . History of CVA (cerebrovascular accident) 12/01/2019  . Hyperparathyroidism due to renal insufficiency ([place]) 08/31/2019  . Anemia in CKD (chronic kidney disease) 02/22/2019  .  Rotator cuff disorder 01/15/2018  . 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 Advanced care planning/counseling discussion 04/13/2017  . Post herpetic neuralgia 10/10/2016  . Hip bursitis 09/18/2015  . Poorly controlled type 2 diabetes mellitus with neuropathy ([place]) 05/01/2015  . Hyperlipidemia associated with type 2 diabetes mellitus ([name]) 05/01/2015  . Chronic kidney disease, stage 4, severely decreased GFR ([place]) 05/01/2015  . Encounter for long-term (current) use of insulin  ([place]) 05/01/2015    Conditions to be addressed/monitored:CHF, HTN, DMII and chronic pain  Care Plan : RNCM: Heart Failure (Adult)  Updates made by Tonya [name] since 04/05/2021 12:00 AM    Problem: RNCM: Symptom Exacerbation (Heart Failure)   Priority: Medium    Long-Range Goal: RNCM: Symptom Exacerbation Prevented or Minimized   Priority: Medium  Note:   Current Barriers:  [name] Knowledge deficits related to basic heart failure pathophysiology and self care management . Lacks social connections . Does not contact provider office for questions/concerns . Lack of scale in home . Financial strain Nurse Case Manager Clinical Goal(s):   patient will weigh self daily and record  patient will verbalize understanding of Heart Failure Action Plan and when to call doctor  patient will take all Heart Failure mediations as prescribed Interventions:  . Collaboration with [name], NP regarding development and update of comprehensive plan of care as evidenced by provider attestation and co-signature . Inter-disciplinary care team collaboration (see longitudinal plan of care) . Basic overview and discussion of pathophysiology of Heart Failure . Provided written and verbal education on low sodium diet. 04-05-2021: Endorses compliance with heart healthy/ADA diet  . Reviewed Heart Failure Action Plan in depth and provided written copy . Assessed for scales in home- has scales . Discussed importance of daily weight . Reviewed role of diuretics in prevention of fluid overload Patient Goals/Self-Care Activities:  Takes Heart Failure Medications as prescribed. 04-05-2021: Compliant with medications  Weighs daily and record (notifying MD of 3 lb weight gain over night or 5 lb in a week) Verbalizes understanding of and follows CHF Action Plan Adheres to low sodium diet  - Take Heart Failure Medications as prescribed - Weigh daily and record (notify MD with 3 lb weight gain over night or 5 lb  in a week) - Follow CHF Action Plan - Adhere to low sodium diet - barriers to lifestyle changes reviewed and addressed - barriers to treatment reviewed and addressed - cognitive screening completed and reviewed - depression screen reviewed - health literacy screening completed or reviewed - healthy lifestyle promoted - medication-adherence assessment completed - rescue (action) plan developed - rescue (action) plan reviewed - self-awareness of signs/symptoms of worsening disease encouraged Follow Up Plan: Telephone follow up appointment with care management team member scheduled for: 06-07-2021 at 1 pm   Care Plan : RNCM: Chronic Pain (Adult)  Updates made by Tonya [name] since 04/05/2021 12:00 AM    Problem: RNCM: Pain Management Plan (Chronic Pain)   Priority: High  Onset Date: 02/12/2021    Goal: RNCM: Pain Management Plan Developed   Start Date: 02/12/2021  Priority: High  Note:   Current Barriers:  [name] Knowledge Deficits related to managing acute/chronic pain . Non-adherence to scheduled provider appointments . Non-adherence to prescribed medication regimen . Difficulty obtaining medications . Chronic Disease Management support and education needs related to chronic pain . Unable to independently manage pain and discomfort to lower back radiating down left leg . Lacks social connections .  Unable to perform IADLs independently . Does not contact provider office for questions/concerns Nurse Case Manager Clinical Goal(s):  . patient will verbalize understanding of plan for managing pain . patient will attend all scheduled medical appointments: 07-29-2021 with pcp, may need sooner appointment, specialist to evaluate pain on 03-05-2021. Sees pain specialist on 04-09-2021 for follow up post procedure  . patient will demonstrate use of different relaxation  skills and/or diversional activities to assist with pain reduction (distraction, imagery, relaxation, massage, acupressure, TENS,  heat, and cold application . patient will report pain at a level less than 3 to 4 on a 10-10 rating scale . patient will use pharmacological and nonpharmacological pain relief strategies . patient will verbalize acceptable level of pain relief and ability to engage in desired activities . patient will engage in desired activities without an increase in pain level Interventions:  . Collaboration with [name], NP regarding development and update of comprehensive plan of care as evidenced by provider attestation and co-signature . Inter-disciplinary care team collaboration (see longitudinal plan of care) . - careful application of heat or ice encouraged . - deep breathing, relaxation and mindfulness use promoted . - effectiveness of pharmacologic therapy monitored . - medication-induced side effects managed . - misuse of pain medication assessed . - motivation and barriers to change assessed and addressed . - mutually acceptable comfort goal set . - pain assessed. 04-05-2021: The patient states her pain level is less than a 3. Procedure has worked well for her. She is having tingling in her legs but will discuss with the specialist at upcoming appointment on 04-09-2021.  . - pain treatment goals reviewed. 04-05-2021: The patient states she is feeling better. She is thankful the procedure has been helpful. Will see the specialist next week.  . - premedication prior to activity encouraged . Evaluation of current treatment plan related to pain in back going down left leg and patient's adherence to plan as established by provider. . Advised patient to call the office for worsening sx/sx of pain or changes noted  . Provided education to patient re: alternative pain relief measures. The patient tried heat application last night but states it was not helpful. States the pain is going down into her left leg from her back.  . Reviewed medications with patient and discussed compliance. The patient is  taking a prednisone dose pack and Robaxin. She feels the Robaxin or the prednisone is not helping  . Collaborated with pcp regarding the patient states she can not see the pain specialist until 03-05-2021, ask about getting a "shot", will discuss with pcp for recommendations. 04-05-2021: Had procedure and is doing well post procedure. Will continue to monitor.  . Discussed plans with patient for ongoing care management follow up and provided patient with direct contact information for care management team . Allow patient to maintain a diary of pain ratings, timing, precipitating events, medications, treatments, and what works best to relieve pain,  . Refer to support groups and self-help groups . Educate patient about the use of pharmacological interventions for pain management- antianxiety, antidepressants, NSAIDS, opioid analgesics,  . Explain the importance of lifestyle modifications to effective pain management  Patient Goals/Self Care Activities:  . - mutually acceptable comfort goal set . - pain assessed . - pain management plan developed . - pain treatment goals reviewed . - patient response to treatment assessed . - sharing of pain management plan with teachers and other caregivers encouraged . Self-administers medications as prescribed .  Attends all scheduled provider appointments . Calls pharmacy for medication refills . Calls provider office for new concerns or questions Follow Up Plan: Telephone follow up appointment with care management team member scheduled for: 06-07-2021 at 1 pm     Task: RNCM: Partner to Develop Chronic Pain Management Plan   Note:   Care Management Activities:    - mutually acceptable comfort goal set - pain assessed - pain management plan developed - pain treatment goals reviewed - patient response to treatment assessed - sharing of pain management plan with teachers and other caregivers encouraged       Care Plan : RNCM: Hypertension (Adult)   Updates made by Tonya [name] since 04/05/2021 12:00 AM    Problem: RNCM: Hypertension (Hypertension)   Priority: Medium    Long-Range Goal: RNCM: Hypertension Monitored   Priority: Medium  Note:   Objective:  . Last practice recorded BP readings:  . BP Readings from Last 3 Encounters: .  03/19/21 . 130/72 .  03/13/21 . 139/88 .  03/05/21 . 102/66 .    [name] Most recent eGFR/CrCl: No results found for: EGFR  No components found for: CRCL Current Barriers:  [name] Knowledge Deficits related to basic understanding of hypertension pathophysiology and self care management . Knowledge Deficits related to understanding of medications prescribed for management of hypertension . Limited Social Support . Lacks social connections . Unable to perform IADLs independently . Does not contact provider office for questions/concerns Case Manager Clinical Goal(s):  [name] Over the next 120 days, patient will verbalize understanding of plan for hypertension management . Over the next 120 days, patient will attend all scheduled medical appointments: 07-29-2021 . Over the next 120 days, patient will demonstrate improved adherence to prescribed treatment plan for hypertension as evidenced by taking all medications as prescribed, monitoring and recording blood pressure as directed, adhering to low sodium/DASH diet . Over the next 120 days, patient will demonstrate improved health management independence as evidenced by checking blood pressure as directed and notifying PCP if SBP>160 or DBP > 90, taking all medications as prescribe, and adhering to a low sodium diet as discussed. . Over the next 120 days, patient will verbalize basic understanding of hypertension disease process and self health management plan as evidenced by compliance with medications, compliance with diet, and working with CCM team to manage health and well being  Interventions:  . Collaboration with [name], NP regarding development and  update of comprehensive plan of care as evidenced by provider attestation and co-signature . Inter-disciplinary care team collaboration (see longitudinal plan of care) . Evaluation of current treatment plan related to hypertension self management and patient's adherence to plan as established by provider. 04-05-2021: The patient is stable and has no new concerns with her blood pressure. Adheres to heart healthy diet and denies any issues with medications compliance. Will continue to monitor . Provided education to patient re: stroke prevention, s/s of heart attack and stroke, DASH diet, complications of uncontrolled blood pressure . Reviewed medications with patient and discussed importance of compliance . Discussed plans with patient for ongoing care management follow up and provided patient with direct contact information for care management team . Advised patient, providing education and rationale, to monitor blood pressure daily and record, calling PCP for findings outside established parameters.  . Reviewed scheduled/upcoming provider appointments including: 07-29-2021 Patient Goals: - blood pressure trends reviewed - depression screen reviewed - home or ambulatory blood pressure monitoring encouraged Self-Care Activities: - Self administers  medications as prescribed Attends all scheduled provider appointments Calls provider office for new concerns, questions, or BP outside discussed parameters Checks BP and records as discussed Follows a low sodium diet/DASH diet Follow Up Plan: Telephone follow up appointment with care management team member scheduled for:  06-07-2021 at 1 pm   Care Plan : RNCM: Diabetes Type 2 (Adult)  Updates made by Tonya [name] since 04/05/2021 12:00 AM    Problem: RNCM: Glycemic Management (Diabetes, Type 2)   Priority: High    Long-Range Goal: RNCM: Glycemic Management Optimized   Priority: High  Note:   Objective:  Lab Results  Component Value Date    HGBA1C 8.2 (H) 01/29/2021 .   Lab Results  Component Value Date   CREATININE 2.45 (H) 02/12/2021   CREATININE 2.60 (H) 01/29/2021   CREATININE 2.56 (H) 10/25/2020 .   [name] No results found for: EGFR Current Barriers:  [name] Knowledge Deficits related to medications used for management of diabetes . Does not use cbg meter  . Limited Social Support . Unable to independently manage blood sugars . Unable to self administer medications as prescribed . Does not adhere to provider recommendations re: checking blood sugars as recommended by the provider  . Does not adhere to prescribed medication regimen . Lacks social connections Case Manager Clinical Goal(s):  . patient will demonstrate improved adherence to prescribed treatment plan for diabetes self care/management as evidenced by: daily monitoring and recording of CBG  adherence to ADA/ carb modified diet exercise 3/4 days/week adherence to prescribed medication regimen contacting provider for new or worsened symptoms or questions Interventions:  . Collaboration with [name], NP regarding development and update of comprehensive plan of care as evidenced by provider attestation and co-signature . Inter-disciplinary care team collaboration (see longitudinal plan of care) . Provided education to patient about basic DM disease process . Reviewed medications with patient and discussed importance of medication adherence . Discussed plans with patient for ongoing care management follow up and provided patient with direct contact information for care management team . Provided patient with written educational materials related to hypo and hyperglycemia and importance of correct treatment. 04-05-2021: The patient denies any highs or lows. The patient states that she is having readings in the 110's. The patient is checking blood sugars regularly at this time.  . Reviewed scheduled/upcoming provider appointments including: 07-29-2021 at 11  am . Advised patient, providing education and rationale, to check cbg bid and record, calling pcp for findings outside established parameters.   . Review of patient status, including review of consultants reports, relevant laboratory and other test results, and medications completed. Self-Care Activities - UNABLE to independently manage DM Self administers oral medications as prescribed Self administers insulin as prescribed Attends all scheduled provider appointments Checks blood sugars as prescribed and utilize hyper and hypoglycemia protocol as needed Adheres to prescribed ADA/carb modified Patient Goals: -- barriers to adherence to treatment plan identified - blood glucose monitoring encouraged - blood glucose readings reviewed - encourage participation in diabetes education classes - individualized medical nutrition therapy provided - resources required to improve adherence to care identified - self-awareness of signs/symptoms of hypo or hyperglycemia encouraged - use of blood glucose monitoring log promoted Follow Up Plan: Telephone follow up appointment with care management team member scheduled for: 06-07-2021 at 1 pm     Plan:Telephone follow up appointment with care management team member scheduled for:  06-07-2021 at 1 pm  [place], MSN, [place] Coordinator  [facility] Mobile: [phone]

## 2021-04-07 DIAGNOSIS — E113312 Type 2 diabetes mellitus with moderate nonproliferative diabetic retinopathy with macular edema, left eye: Secondary | ICD-10-CM | POA: Diagnosis not present

## 2021-04-08 ENCOUNTER — Ambulatory Visit
Payer: Medicare Other | Attending: Student in an Organized Health Care Education/Training Program | Admitting: Student in an Organized Health Care Education/Training Program

## 2021-04-08 ENCOUNTER — Other Ambulatory Visit: Payer: Self-pay

## 2021-04-08 DIAGNOSIS — M5416 Radiculopathy, lumbar region: Secondary | ICD-10-CM

## 2021-04-08 DIAGNOSIS — E113312 Type 2 diabetes mellitus with moderate nonproliferative diabetic retinopathy with macular edema, left eye: Secondary | ICD-10-CM | POA: Diagnosis not present

## 2021-04-08 NOTE — Progress Notes (Signed)
I attempted to call the patient however no response. Voicemail left instructing patient to call front desk office at [phone] to reschedule appointment. -Dr Dandria Griego  

## 2021-04-15 DIAGNOSIS — E875 Hyperkalemia: Secondary | ICD-10-CM | POA: Diagnosis not present

## 2021-04-15 DIAGNOSIS — N2581 Secondary hyperparathyroidism of renal origin: Secondary | ICD-10-CM | POA: Diagnosis not present

## 2021-04-15 DIAGNOSIS — R809 Proteinuria, unspecified: Secondary | ICD-10-CM | POA: Diagnosis not present

## 2021-04-15 DIAGNOSIS — N184 Chronic kidney disease, stage 4 (severe): Secondary | ICD-10-CM | POA: Diagnosis not present

## 2021-04-15 DIAGNOSIS — E1129 Type 2 diabetes mellitus with other diabetic kidney complication: Secondary | ICD-10-CM | POA: Diagnosis not present

## 2021-04-15 DIAGNOSIS — D631 Anemia in chronic kidney disease: Secondary | ICD-10-CM | POA: Diagnosis not present

## 2021-04-15 DIAGNOSIS — I129 Hypertensive chronic kidney disease with stage 1 through stage 4 chronic kidney disease, or unspecified chronic kidney disease: Secondary | ICD-10-CM | POA: Diagnosis not present

## 2021-04-17 NOTE — Progress Notes (Deleted)
Cardiology Office Note  Date:  04/17/2021   ID:  Tonya [name], DOB Feb 15, 1946, MRN [id number]  PCP:  [name], NP   No chief complaint on file.   HPI:  75 yo woman with past medical hx of  DM, HBA1C 13 down to 11.8 Chronic Leg edema HTN Hyperlipidemia Chronic kidney disease, followed by nephrology Presenting for f/u of her cardiomyopathy, leg swelling  Most recent lab work reviewed HBA1C 11.7 Followed by endocrinology  Total cholesterol 2 years ago 254 with LDL 160 Reports that she is compliant with her Crestor 5 mg  Increased SOB on last visit Had echocardiogram that showed normal LV function  Echo  12./17/2019 results reviewed with her The cavity size was normal. Wall thickness was   increased in a pattern of mild LVH. Systolic function was normal.   The estimated ejection fraction was in the range of 55% to 60%.   Doppler parameters are consistent with abnormal left ventricular   relaxation (grade 1 diastolic dysfunction).  Reports that she is tired  EKG personally reviewed by myself on todays visit Shows normal sinus rhythm with rate 73 bpm T wave abnormality 1 and aVL Previously noted T waves anterolateral leads have resolved   PMH:   has a past medical history of (HFpEF) heart failure with preserved ejection fraction ([place]), CKD (chronic kidney disease), stage III ([place]), Diabetes mellitus without complication ([place]), Hyperlipidemia, Hypertension, Stroke ([place]), and Wears dentures.  PSH:    Past Surgical History:  Procedure Laterality Date  . CATARACT EXTRACTION W/PHACO Left 02/05/2021   Procedure: CATARACT EXTRACTION PHACO AND INTRAOCULAR LENS PLACEMENT (IOC) LEFT DIABETIC 5.47 00:51.4;  Surgeon: Birder Robson, MD;  Location: [place];  Service: Ophthalmology;  Laterality: Left;  Diabetic - insulin and oral meds  . CATARACT EXTRACTION W/PHACO Right 03/19/2021   Procedure: CATARACT EXTRACTION PHACO AND INTRAOCULAR LENS PLACEMENT (IOC) RIGHT  DIABETIC 6.09 00:39.6;  Surgeon: Birder Robson, MD;  Location: [place];  Service: Ophthalmology;  Laterality: Right;  . COLONOSCOPY  12/15/2006  . COLONOSCOPY WITH PROPOFOL N/A 06/06/2019   Procedure: COLONOSCOPY WITH PROPOFOL;  Surgeon: [name], MD;  Location[facility] ENDOSCOPY;  Service: Gastroenterology;  Laterality: N/A;  . COLONOSCOPY WITH PROPOFOL N/A 01/16/2020   Procedure: COLONOSCOPY WITH BIOPSY;  Surgeon: [name], MD;  Location: [place];  Service: Endoscopy;  Laterality: N/A;  Diabetic - insulin and oral meds  . POLYPECTOMY N/A 01/16/2020   Procedure: POLYPECTOMY;  Surgeon: [name], MD;  Location: [place];  Service: Endoscopy;  Laterality: N/A;  . TUBAL LIGATION      Current Outpatient Medications  Medication Sig Dispense Refill  . aspirin 81 MG tablet Take 81 mg by mouth daily.    . carvedilol (COREG) 3.125 MG tablet TAKE 1 TABLET BY MOUTH  TWICE DAILY WITH A MEAL 180 tablet 3  . clopidogrel (PLAVIX) 75 MG tablet TAKE 1 TABLET BY MOUTH  DAILY 90 tablet 3  . docusate sodium (COLACE) 100 MG capsule Take 100 mg by mouth daily as needed for mild constipation. (Patient not taking: No sig reported)    . ferrous sulfate 325 (65 FE) MG EC tablet Take 1 tablet (325 mg total) by mouth 3 (three) times daily with meals. 120 tablet 3  . furosemide (LASIX) 40 MG tablet TAKE 1 TABLET BY MOUTH EVERY DAY 90 tablet 4  . HUMALOG KWIKPEN 100 UNIT/ML KiwkPen 6 Units. Take 6 units before breakfast, lunch, supper if glucose greater than 70  11  . insulin detemir (LEVEMIR) 100 UNIT/ML injection Inject 10 Units into the skin every morning.     [name] NOVOFINE 32G X 6 MM MISC USE AS DIRECTED THREE TIMES DAILY WITH HUMALOG 100 each 3  . olmesartan (BENICAR) 40 MG tablet Take 1 tablet (40 mg total) by mouth daily. 90 tablet 4  . omeprazole (PRILOSEC) 20 MG capsule TAKE 1 CAPSULE BY MOUTH  TWICE DAILY 180 capsule 3  . pioglitazone (ACTOS) 30 MG tablet Take 30 mg by mouth  daily.    . rosuvastatin (CRESTOR) 5 MG tablet TAKE 1 TABLET BY MOUTH  DAILY 90 tablet 3  . Vitamin D, Cholecalciferol, 50 MCG (2000 UT) CAPS Take 2,000 Units by mouth daily.     No current facility-administered medications for this visit.     Allergies:   Atorvastatin, Gabapentin, Pravastatin, Pregabalin, and Trulicity [dulaglutide]   Social History:  The patient  reports that she has never smoked. She has never used smokeless tobacco. She reports that she does not drink alcohol and does not use drugs.   Family History:   family history includes Diabetes in her brother, brother, mother, and sister; Gout in her son; Heart attack in her sister; Hyperlipidemia in her father; Hypertension in her father; Kidney disease in her son; Lymphoma in her sister; Stroke in her mother.    Review of Systems: Review of Systems  Constitutional: Positive for malaise/fatigue.  Respiratory: Negative.   Cardiovascular: Negative.   Gastrointestinal: Negative.   Musculoskeletal: Negative.   Neurological: Negative.   Psychiatric/Behavioral: Negative.   All other systems reviewed and are negative.    PHYSICAL EXAM: VS:  LMP  (LMP Unknown)  , BMI There is no height or weight on file to calculate BMI. Constitutional:  oriented to person, place, and time. No distress.  HENT:  Head: Grossly normal Eyes:  no discharge. No scleral icterus.  Neck: No JVD, no carotid bruits  Cardiovascular: Regular rate and rhythm, no murmurs appreciated Pulmonary/Chest: Clear to auscultation bilaterally, no wheezes or rails Abdominal: Soft.  no distension.  no tenderness.  Musculoskeletal: Normal range of motion Neurological:  normal muscle tone. Coordination normal. No atrophy Skin: Skin warm and dry Psychiatric: normal affect, pleasant   Recent Labs: 01/29/2021: TSH 3.110 02/12/2021: ALT 12; BUN 53; Creatinine, Ser 2.45; Hemoglobin 8.8; Platelets 277; Potassium 4.3; Sodium 139    Lipid Panel Lab Results  Component  Value Date   CHOL 183 01/29/2021   HDL 80 01/29/2021   LDLCALC 88 01/29/2021   TRIG 81 01/29/2021      Wt Readings from Last 3 Encounters:  03/19/21 170 lb (77.1 kg)  03/13/21 168 lb (76.2 kg)  03/05/21 167 lb (75.8 kg)     ASSESSMENT AND PLAN:  Fatigue Likely a benign finding, Discussed anginal symptoms with her She otherwise has relatively good exercise tolerance Recommended regular walking program If symptoms get worse we would order stress test.  Details of stress test discussed with her  Shortness of breath She reported having shortness of breath on last clinic visit, echocardiogram reviewed this was normal We did discuss stress testing if symptoms get worse, she has declined at this time    Poorly controlled type 2 diabetes mellitus with neuropathy ([place]) Discussed diet with her, medication compliance A1c markedly elevated placing her at higher risk also with high cholesterol Recommended close follow-up with endocrinology  Chronic kidney disease, stage 4, severely decreased GFR ([place])  kidney disease likely secondary to long-standing diabetes, hypertension  Rising creatinine over the past year now 2.0  Mixed hyperlipidemia  On low-dose Crestor no recent lipid panel available Order placed for liver and lipids She prefers to have this done in the hospital  Hypertension Blood pressure borderline elevated, recommend she monitor pressures at home   Total encounter time more than 25 minutes  Greater than 50% was spent in counseling and coordination of care with the patient   Disposition:   F/U 12 mo   No orders of the defined types were placed in this encounter.    Signed, Esmond Plants, M.D., Ph.D. 04/17/2021  [facility] [place], [place] [phone]

## 2021-04-19 ENCOUNTER — Other Ambulatory Visit: Payer: Self-pay

## 2021-04-19 ENCOUNTER — Encounter: Payer: Self-pay | Admitting: Cardiovascular Disease

## 2021-04-19 ENCOUNTER — Ambulatory Visit: Payer: Medicare Other | Admitting: Cardiovascular Disease

## 2021-04-19 VITALS — BP 130/82 | HR 82 | Ht 67.0 in | Wt 171.5 lb

## 2021-04-19 DIAGNOSIS — I5189 Other ill-defined heart diseases: Secondary | ICD-10-CM

## 2021-04-19 DIAGNOSIS — I130 Hypertensive heart and chronic kidney disease with heart failure and stage 1 through stage 4 chronic kidney disease, or unspecified chronic kidney disease: Secondary | ICD-10-CM

## 2021-04-19 DIAGNOSIS — E1140 Type 2 diabetes mellitus with diabetic neuropathy, unspecified: Secondary | ICD-10-CM

## 2021-04-19 DIAGNOSIS — E782 Mixed hyperlipidemia: Secondary | ICD-10-CM

## 2021-04-19 DIAGNOSIS — E1165 Type 2 diabetes mellitus with hyperglycemia: Secondary | ICD-10-CM

## 2021-04-19 DIAGNOSIS — D631 Anemia in chronic kidney disease: Secondary | ICD-10-CM

## 2021-04-19 DIAGNOSIS — I5032 Chronic diastolic (congestive) heart failure: Secondary | ICD-10-CM | POA: Diagnosis not present

## 2021-04-19 DIAGNOSIS — I1310 Hypertensive heart and chronic kidney disease without heart failure, with stage 1 through stage 4 chronic kidney disease, or unspecified chronic kidney disease: Secondary | ICD-10-CM

## 2021-04-19 DIAGNOSIS — N184 Chronic kidney disease, stage 4 (severe): Secondary | ICD-10-CM | POA: Diagnosis not present

## 2021-04-19 NOTE — Patient Instructions (Signed)
Medication Instructions:  No changes  If you need a refill on your cardiac medications before your next appointment, please call your pharmacy.    Lab work: No new labs needed   If you have labs (blood work) drawn today and your tests are completely normal, you will receive your results only by: . MyChart Message (if you have MyChart) OR . A paper copy in the mail If you have any lab test that is abnormal or we need to change your treatment, we will call you to review the results.   Testing/Procedures: No new testing needed   Follow-Up: At [facility], you and your health needs are our priority.  As part of our continuing mission to provide you with exceptional heart care, we have created designated Provider Care Teams.  These Care Teams include your primary Cardiologist (physician) and Advanced Practice Providers (APPs -  Physician Assistants and Nurse Practitioners) who all work together to provide you with the care you need, when you need it.  . You will need a follow up appointment in 12 months  . Providers on your designated Care Team:   . Christopher Berge, NP . Ryan Dunn, PA-C . Jacquelyn Visser, PA-C  Any Other Special Instructions Will Be Listed Below (If Applicable).  COVID-19 Vaccine Information can be found at: [email or web address] For questions related to vaccine distribution or appointments, please email [email or web address] or call [phone].     

## 2021-04-19 NOTE — Progress Notes (Signed)
Cardiology Office Note  Date:  04/19/2021   ID:  Tonya [name], DOB 03/27/1946, MRN [id number]  PCP:  [name], NP   Chief Complaint  Patient presents with  . 12 month follow up     Patient c/o LE edema. Medications reviewed by the patient verbally.     HPI:  75 yo woman with past medical hx of  DM, HBA1C 13 down to 7 to 8 Chronic Leg edema HTN Hyperlipidemia Chronic kidney disease, followed by nephrology Presenting for f/u of her cardiomyopathy, leg swelling''  Last seen by myself January 2020 Last seen in clinic by one of our providers January 2021 On that visit reported blood pressure 176 and 160 systolic, had little bit of swelling at the end of the day thought it was a side effect from the Lyrica Was on Crestor, Lasix, carvedilol low-dose with Plavix and aspirin  Echocardiogram December 2020 normal ejection fraction  Lab work reviewed February 2022 Total cholesterol 183 LDL 88 Creatinine trending upwards now 2.6  Prior shingles righ lower back, has chronic pain Sciatic pain as well Trouble walking long distance  EKG personally reviewed by myself on todays visit Shows normal sinus rhythm rate 82 T wave abnormality 1 and aVL, previously noted   PMH:   has a past medical history of (HFpEF) heart failure with preserved ejection fraction ([place]), CKD (chronic kidney disease), stage III ([place]), Diabetes mellitus without complication ([place]), Hyperlipidemia, Hypertension, Stroke ([place]), and Wears dentures.  PSH:    Past Surgical History:  Procedure Laterality Date  . CATARACT EXTRACTION W/PHACO Left 02/05/2021   Procedure: CATARACT EXTRACTION PHACO AND INTRAOCULAR LENS PLACEMENT (IOC) LEFT DIABETIC 5.47 00:51.4;  Surgeon: Birder Robson, MD;  Location: [place];  Service: Ophthalmology;  Laterality: Left;  Diabetic - insulin and oral meds  . CATARACT EXTRACTION W/PHACO Right 03/19/2021   Procedure: CATARACT EXTRACTION PHACO AND INTRAOCULAR LENS PLACEMENT  (IOC) RIGHT DIABETIC 6.09 00:39.6;  Surgeon: Birder Robson, MD;  Location: [place];  Service: Ophthalmology;  Laterality: Right;  . COLONOSCOPY  12/15/2006  . COLONOSCOPY WITH PROPOFOL N/A 06/06/2019   Procedure: COLONOSCOPY WITH PROPOFOL;  Surgeon: [name], MD;  Location[facility] ENDOSCOPY;  Service: Gastroenterology;  Laterality: N/A;  . COLONOSCOPY WITH PROPOFOL N/A 01/16/2020   Procedure: COLONOSCOPY WITH BIOPSY;  Surgeon: [name], MD;  Location: [place];  Service: Endoscopy;  Laterality: N/A;  Diabetic - insulin and oral meds  . POLYPECTOMY N/A 01/16/2020   Procedure: POLYPECTOMY;  Surgeon: [name], MD;  Location: [place];  Service: Endoscopy;  Laterality: N/A;  . TUBAL LIGATION      Current Outpatient Medications  Medication Sig Dispense Refill  . aspirin 81 MG tablet Take 81 mg by mouth daily.    . carvedilol (COREG) 3.125 MG tablet TAKE 1 TABLET BY MOUTH  TWICE DAILY WITH A MEAL 180 tablet 3  . clopidogrel (PLAVIX) 75 MG tablet TAKE 1 TABLET BY MOUTH  DAILY 90 tablet 3  . docusate sodium (COLACE) 100 MG capsule Take 100 mg by mouth daily as needed for mild constipation.    . ferrous sulfate 325 (65 FE) MG tablet Take 325 mg by mouth daily with breakfast.    . furosemide (LASIX) 40 MG tablet TAKE 1 TABLET BY MOUTH EVERY DAY 90 tablet 4  . HUMALOG KWIKPEN 100 UNIT/ML KiwkPen 6 Units. Take 6 units before breakfast, lunch, supper if glucose greater than 70  11  . insulin detemir (LEVEMIR) 100 UNIT/ML  injection Inject 10 Units into the skin daily.    [name] NOVOFINE 32G X 6 MM MISC USE AS DIRECTED THREE TIMES DAILY WITH HUMALOG 100 each 3  . olmesartan (BENICAR) 40 MG tablet Take 1 tablet (40 mg total) by mouth daily. 90 tablet 4  . omeprazole (PRILOSEC) 20 MG capsule TAKE 1 CAPSULE BY MOUTH  TWICE DAILY 180 capsule 3  . pioglitazone (ACTOS) 30 MG tablet Take 30 mg by mouth daily.    . rosuvastatin (CRESTOR) 5 MG tablet TAKE 1 TABLET BY MOUTH  DAILY 90  tablet 3  . Vitamin D, Cholecalciferol, 50 MCG (2000 UT) CAPS Take 2,000 Units by mouth daily.    . Torsemide 40 MG TABS Take by mouth. (Patient not taking: Reported on 04/19/2021)     No current facility-administered medications for this visit.    Allergies:   Atorvastatin, Gabapentin, Pravastatin, Pregabalin, and Trulicity [dulaglutide]   Social History:  The patient  reports that she has never smoked. She has never used smokeless tobacco. She reports that she does not drink alcohol and does not use drugs.   Family History:   family history includes Diabetes in her brother, brother, mother, and sister; Gout in her son; Heart attack in her sister; Hyperlipidemia in her father; Hypertension in her father; Kidney disease in her son; Lymphoma in her sister; Stroke in her mother.    Review of Systems: Review of Systems  Constitutional: Negative.   Respiratory: Negative.   Cardiovascular: Negative.   Gastrointestinal: Negative.   Musculoskeletal: Positive for back pain.  Neurological: Negative.   Psychiatric/Behavioral: Negative.   All other systems reviewed and are negative.    PHYSICAL EXAM: VS:  BP 130/82 (BP Location: Left Arm, Patient Position: Sitting, Cuff Size: Normal)   Pulse 82   Ht 5\' 7"  (1.702 m)   Wt 171 lb 8 oz (77.8 kg)   LMP  (LMP Unknown)   SpO2 95%   BMI 26.86 kg/m  , BMI Body mass index is 26.86 kg/m. Constitutional:  oriented to person, place, and time. No distress.  HENT:  Head: Grossly normal Eyes:  no discharge. No scleral icterus.  Neck: No JVD, no carotid bruits  Cardiovascular: Regular rate and rhythm, no murmurs appreciated Pulmonary/Chest: Clear to auscultation bilaterally, no wheezes or rails Abdominal: Soft.  no distension.  no tenderness.  Musculoskeletal: Normal range of motion Neurological:  normal muscle tone. Coordination normal. No atrophy Skin: Skin warm and dry Psychiatric: normal affect, pleasant   Recent Labs: 01/29/2021: TSH  3.110 02/12/2021: ALT 12; BUN 53; Creatinine, Ser 2.45; Hemoglobin 8.8; Platelets 277; Potassium 4.3; Sodium 139    Lipid Panel Lab Results  Component Value Date   CHOL 183 01/29/2021   HDL 80 01/29/2021   LDLCALC 88 01/29/2021   TRIG 81 01/29/2021      Wt Readings from Last 3 Encounters:  04/19/21 171 lb 8 oz (77.8 kg)  03/19/21 170 lb (77.1 kg)  03/13/21 168 lb (76.2 kg)     ASSESSMENT AND PLAN:  Fatigue Noted previously, chronic, recommended exercise program  Shortness of breath Previously declined further work-up, she has normal ejection fraction by echocardiogram 2000 22,021    Poorly controlled type 2 diabetes mellitus with neuropathy ([place]) Discussed diet with her, medication compliance Already with complicating kidney disease, stressed importance of aggressive diabetes control  Chronic kidney disease, stage 4, severely decreased GFR ([place])  kidney disease likely secondary to long-standing diabetes, hypertension  Creatinine greater than 2 Stressed importance of  aggressive diabetes control Nephrology changed to torsemide.  Mixed hyperlipidemia  Continue Crestor  Hypertension On  ARB, per nephrology Avoid calcium channel blockers given history of leg swelling   Total encounter time more than 25 minutes  Greater than 50% was spent in counseling and coordination of care with the patient     No orders of the defined types were placed in this encounter.    Signed, Esmond Plants, M.D., Ph.D. 04/19/2021  [facility] [place], [place] [phone]

## 2021-04-23 ENCOUNTER — Other Ambulatory Visit: Payer: Self-pay

## 2021-04-23 ENCOUNTER — Ambulatory Visit
Payer: Medicare Other | Attending: Student in an Organized Health Care Education/Training Program | Admitting: Student in an Organized Health Care Education/Training Program

## 2021-04-23 ENCOUNTER — Encounter: Payer: Self-pay | Admitting: Student in an Organized Health Care Education/Training Program

## 2021-04-23 ENCOUNTER — Telehealth: Payer: Medicare Other | Admitting: Student in an Organized Health Care Education/Training Program

## 2021-04-23 NOTE — Progress Notes (Signed)
I attempted to call the patient however no response. Voicemail left instructing patient to call front desk office at [phone] to reschedule appointment. -Dr Basem Yannuzzi  

## 2021-05-06 DIAGNOSIS — Z961 Presence of intraocular lens: Secondary | ICD-10-CM | POA: Diagnosis not present

## 2021-05-06 DIAGNOSIS — E113293 Type 2 diabetes mellitus with mild nonproliferative diabetic retinopathy without macular edema, bilateral: Secondary | ICD-10-CM | POA: Diagnosis not present

## 2021-05-08 DIAGNOSIS — E1122 Type 2 diabetes mellitus with diabetic chronic kidney disease: Secondary | ICD-10-CM | POA: Diagnosis not present

## 2021-05-08 DIAGNOSIS — Z794 Long term (current) use of insulin: Secondary | ICD-10-CM | POA: Diagnosis not present

## 2021-05-08 DIAGNOSIS — N1832 Chronic kidney disease, stage 3b: Secondary | ICD-10-CM | POA: Diagnosis not present

## 2021-05-15 DIAGNOSIS — E1165 Type 2 diabetes mellitus with hyperglycemia: Secondary | ICD-10-CM | POA: Diagnosis not present

## 2021-05-15 DIAGNOSIS — E1121 Type 2 diabetes mellitus with diabetic nephropathy: Secondary | ICD-10-CM | POA: Diagnosis not present

## 2021-05-15 DIAGNOSIS — E1122 Type 2 diabetes mellitus with diabetic chronic kidney disease: Secondary | ICD-10-CM | POA: Diagnosis not present

## 2021-05-15 DIAGNOSIS — N1832 Chronic kidney disease, stage 3b: Secondary | ICD-10-CM | POA: Diagnosis not present

## 2021-05-15 DIAGNOSIS — E113393 Type 2 diabetes mellitus with moderate nonproliferative diabetic retinopathy without macular edema, bilateral: Secondary | ICD-10-CM | POA: Diagnosis not present

## 2021-05-15 DIAGNOSIS — Z794 Long term (current) use of insulin: Secondary | ICD-10-CM | POA: Diagnosis not present

## 2021-05-29 DIAGNOSIS — R809 Proteinuria, unspecified: Secondary | ICD-10-CM | POA: Diagnosis not present

## 2021-05-29 DIAGNOSIS — E1121 Type 2 diabetes mellitus with diabetic nephropathy: Secondary | ICD-10-CM | POA: Diagnosis not present

## 2021-05-29 DIAGNOSIS — E875 Hyperkalemia: Secondary | ICD-10-CM | POA: Diagnosis not present

## 2021-05-29 DIAGNOSIS — N2581 Secondary hyperparathyroidism of renal origin: Secondary | ICD-10-CM | POA: Diagnosis not present

## 2021-05-29 DIAGNOSIS — N184 Chronic kidney disease, stage 4 (severe): Secondary | ICD-10-CM | POA: Diagnosis not present

## 2021-05-29 DIAGNOSIS — D631 Anemia in chronic kidney disease: Secondary | ICD-10-CM | POA: Diagnosis not present

## 2021-06-07 ENCOUNTER — Telehealth: Payer: Self-pay | Admitting: General Practice

## 2021-06-07 ENCOUNTER — Telehealth: Payer: Self-pay

## 2021-06-07 NOTE — Telephone Encounter (Signed)
  Care Management   Follow Up Note   06/07/2021 Name: Tonya [name] MRN: [id number] DOB: 1946-12-02   Referred by: [name], NP Reason for referral : Chronic Care Management (RNCM: Follow up for Chronic Disease Management and Care Coordination Needs)   An unsuccessful telephone outreach was attempted today. The patient was referred to the case management team for assistance with care management and care coordination.   Follow Up Plan: The care management team will reach out to the patient again over the next 30 to 60 days.   [name] RN, MSN, [facility] Mobile: [phone]

## 2021-07-10 ENCOUNTER — Telehealth: Payer: Medicare Other

## 2021-07-10 ENCOUNTER — Telehealth: Payer: Medicare Other | Admitting: General Practice

## 2021-07-10 NOTE — Telephone Encounter (Signed)
  Care Management   Follow Up Note   07/10/2021 Name: Tonya [name] MRN: [id number] DOB: 19-Aug-1946   Referred by: [name], NP Reason for referral : Chronic Care Management (RNCM: Follow up for Chronic Disease Management and Care Coordination Needs )   An unsuccessful telephone outreach was attempted today. The patient was referred to the case management team for assistance with care management and care coordination. Called to the preferred number and it said it had been changed. Called the home number and there was no answer. Will get scheduler to reschedule appointment.   Follow Up Plan: The care management team will reach out to the patient again over the next 30 to 60 days.   [name] RN, MSN, [facility] Mobile: [phone]

## 2021-07-19 NOTE — Telephone Encounter (Signed)
Patient has been rescheduled.

## 2021-07-23 ENCOUNTER — Ambulatory Visit (INDEPENDENT_AMBULATORY_CARE_PROVIDER_SITE_OTHER): Payer: Medicare Other | Admitting: General Practice

## 2021-07-23 ENCOUNTER — Telehealth: Payer: Medicare Other | Admitting: General Practice

## 2021-07-23 DIAGNOSIS — E1140 Type 2 diabetes mellitus with diabetic neuropathy, unspecified: Secondary | ICD-10-CM

## 2021-07-23 DIAGNOSIS — M5442 Lumbago with sciatica, left side: Secondary | ICD-10-CM

## 2021-07-23 DIAGNOSIS — G8929 Other chronic pain: Secondary | ICD-10-CM

## 2021-07-23 DIAGNOSIS — E1165 Type 2 diabetes mellitus with hyperglycemia: Secondary | ICD-10-CM | POA: Diagnosis not present

## 2021-07-23 DIAGNOSIS — I10 Essential (primary) hypertension: Secondary | ICD-10-CM

## 2021-07-23 DIAGNOSIS — R490 Dysphonia: Secondary | ICD-10-CM

## 2021-07-23 DIAGNOSIS — I5032 Chronic diastolic (congestive) heart failure: Secondary | ICD-10-CM

## 2021-07-23 DIAGNOSIS — G894 Chronic pain syndrome: Secondary | ICD-10-CM

## 2021-07-23 NOTE — Chronic Care Management (AMB) (Signed)
Chronic Care Management   CCM RN Visit Note  07/23/2021 Name: Tonya [name] MRN: [id number] DOB: 1946/06/17  Subjective: Tonya [name] is a 75 y.o. year old female who is a primary care patient of Cannady, [name], NP. The care management team was consulted for assistance with disease management and care coordination needs.    Engaged with patient by telephone for follow up visit in response to provider referral for case management and/or care coordination services.   Consent to Services:  The patient was given information about Chronic Care Management services, agreed to services, and gave verbal consent prior to initiation of services.  Please see initial visit note for detailed documentation.   Patient agreed to services and verbal consent obtained.   Assessment: Review of patient past medical history, allergies, medications, health status, including review of consultants reports, laboratory and other test data, was performed as part of comprehensive evaluation and provision of chronic care management services.   SDOH (Social Determinants of Health) assessments and interventions performed:    CCM Care Plan  Allergies  Allergen Reactions   Atorvastatin     Myalgias    Gabapentin Other (See Comments)    Speech impairment    Pravastatin     Myalgias    Pregabalin     Speech impairment   Trulicity [Dulaglutide] Hives    Outpatient Encounter Medications as of 07/23/2021  Medication Sig   aspirin 81 MG tablet Take 81 mg by mouth daily.   carvedilol (COREG) 3.125 MG tablet TAKE 1 TABLET BY MOUTH  TWICE DAILY WITH A MEAL   clopidogrel (PLAVIX) 75 MG tablet TAKE 1 TABLET BY MOUTH  DAILY   docusate sodium (COLACE) 100 MG capsule Take 100 mg by mouth daily as needed for mild constipation.   ferrous sulfate 325 (65 FE) MG tablet Take 325 mg by mouth daily with breakfast.   HUMALOG KWIKPEN 100 UNIT/ML KiwkPen 6 Units. Take 6 units before breakfast, lunch, supper if glucose greater  than 70   insulin detemir (LEVEMIR) 100 UNIT/ML injection Inject 10 Units into the skin daily.   NOVOFINE 32G X 6 MM MISC USE AS DIRECTED THREE TIMES DAILY WITH HUMALOG   olmesartan (BENICAR) 40 MG tablet Take 1 tablet (40 mg total) by mouth daily.   omeprazole (PRILOSEC) 20 MG capsule TAKE 1 CAPSULE BY MOUTH  TWICE DAILY   pioglitazone (ACTOS) 30 MG tablet Take 30 mg by mouth daily.   rosuvastatin (CRESTOR) 5 MG tablet TAKE 1 TABLET BY MOUTH  DAILY   Torsemide 40 MG TABS Take by mouth. (Patient not taking: Reported on 04/19/2021)   Vitamin D, Cholecalciferol, 50 MCG (2000 UT) CAPS Take 2,000 Units by mouth daily.   No facility-administered encounter medications on file as of 07/23/2021.    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[place]) 09/22/2017   Advanced care planning/counseling discussion 04/13/2017   Post herpetic neuralgia 10/10/2016   Hip bursitis 09/18/2015   Poorly controlled type 2  diabetes mellitus with neuropathy ([place]) 05/01/2015   Hyperlipidemia associated with type 2 diabetes mellitus ([place]) 05/01/2015   Chronic kidney disease, stage 4, severely decreased GFR ([place]) 05/01/2015   Encounter for long-term (current) use of insulin ([place]) 05/01/2015    Conditions to be addressed/monitored:CHF, HTN, DMII, and Chronic pain, hoarseness  Care Plan : RNCM: Heart Failure (Adult)  Updates made by Vanita Ingles since 07/23/2021 12:00 AM      Problem: RNCM: Symptom Exacerbation (Heart Failure)   Priority: Medium     Long-Range Goal: RNCM: Symptom Exacerbation Prevented or Minimized   Start Date: 02/15/2021  Expected End Date: 06/03/2022  This Visit's Progress: On track  Priority: Medium  Note:   Current Barriers:  Knowledge deficits related to basic heart failure pathophysiology and self care management Lacks social connections Does not contact provider office for questions/concerns Lack of scale in home Financial strain Nurse Case Manager Clinical Goal(s):  patient will weigh self daily and record patient will verbalize understanding of Heart Failure Action Plan and when to call doctor patient will take all Heart Failure mediations as prescribed Interventions:  Collaboration with [name], NP regarding development and update of comprehensive plan of care as evidenced by provider attestation and co-signature Inter-disciplinary care team collaboration (see longitudinal plan of care) Basic overview and discussion of pathophysiology of Heart Failure Provided written and verbal education on low sodium diet. 07-23-2021: Endorses compliance with heart healthy/ADA diet  Reviewed Heart Failure Action Plan in depth and provided written copy Assessed for scales in home- has scales Discussed importance of daily weight Reviewed role of diuretics in prevention of fluid overload Evaluation of HF. 07-23-2021: The patient states she is doing well. Denies any exacerbations of HF at this time. Is compliant with the poc, medications and dietary restrictions. Will continue to monitor.  Patient Goals/Self-Care Activities:  Takes Heart Failure Medications as prescribed. 07-23-2021: Compliant with medications  Weighs daily and record (notifying MD of 3 lb weight gain over night or 5 lb in a week) Verbalizes understanding of and follows CHF Action Plan Adheres to low sodium diet  - Take Heart Failure Medications as prescribed - Weigh  daily and record (notify MD with 3 lb weight gain over night or 5 lb in a week) - Follow CHF Action Plan - Adhere to low sodium diet - barriers to lifestyle changes reviewed and addressed - barriers to treatment reviewed and addressed - cognitive screening completed and reviewed - depression screen reviewed - health literacy screening completed or reviewed - healthy lifestyle promoted - medication-adherence assessment completed - rescue (action) plan developed - rescue (action) plan reviewed - self-awareness of signs/symptoms of worsening disease encouraged Follow Up Plan: Telephone follow up appointment with care management team member scheduled for: 09-24-2021 at 1 pm    Task: RNCM: Identify and Minimize Risk of Heart Failure Exacerbation Completed 07/23/2021  Outcome: Positive  Note:   Care Management Activities:    - barriers to lifestyle changes reviewed and addressed - barriers to treatment reviewed and addressed - cognitive screening completed and reviewed - depression screen reviewed - health literacy screening completed or reviewed - healthy lifestyle promoted - medication-adherence assessment completed - rescue (action) plan developed - rescue (action) plan reviewed - self-awareness of signs/symptoms of worsening disease encouraged        Care Plan : RNCM: Chronic Pain (Adult)  Updates made by Vanita Ingles since 07/23/2021 12:00 AM     Problem: RNCM: Pain Management Plan (Chronic Pain)  Priority: High  Onset Date: 02/12/2021     Long-Range Goal: RNCM: Pain Management Plan Developed   Start Date: 02/12/2021  Expected End Date: 07/03/2022  This Visit's Progress: On track  Priority: High  Note:   Current Barriers:  Knowledge Deficits related to managing acute/chronic pain Non-adherence to scheduled provider appointments Non-adherence to prescribed medication regimen Difficulty obtaining medications Chronic Disease Management support and education needs related  to chronic pain Unable to independently manage pain and discomfort to lower back radiating down left leg Lacks social connections Unable to perform IADLs independently Does not contact provider office for questions/concerns Nurse Case Manager Clinical Goal(s):  patient will verbalize understanding of plan for managing pain patient will attend all scheduled medical appointments: 07-29-2021 with pcp, patient will demonstrate use of different relaxation  skills and/or diversional activities to assist with pain reduction (distraction, imagery, relaxation, massage, acupressure, TENS, heat, and cold application patient will report pain at a level less than 3 to 4 on a 10-10 rating scale patient will use pharmacological and nonpharmacological pain relief strategies patient will verbalize acceptable level of pain relief and ability to engage in desired activities patient will engage in desired activities without an increase in pain level Interventions:  Collaboration with [name] T, NP regarding development and update of comprehensive plan of care as evidenced by provider attestation and co-signature Inter-disciplinary care team collaboration (see longitudinal plan of care) - careful application of heat or ice encouraged - deep breathing, relaxation and mindfulness use promoted - effectiveness of pharmacologic therapy monitored - medication-induced side effects managed - misuse of pain medication assessed - motivation and barriers to change assessed and addressed - mutually acceptable comfort goal set - pain assessed. 04-05-2021: The patient states her pain level is less than a 3. Procedure has worked well for her. She is having tingling in her legs but will discuss with the specialist at upcoming appointment on 04-09-2021. 07-23-2021: The patient denies any tingling in her legs. That has resolved. She is still having issues with back pain and discomfort. It bothers her the most when she is in the  kitchen cooking or washing dishes. Discussed pacing activity. She states she rest well at night if she takes a Tylenol Arthritis for pain management. Encouraged the patient to discuss options with the pcp at upcoming appointment on 07-29-2021. - pain treatment goals reviewed. 04-05-2021: The patient states she is feeling better. She is thankful the procedure has been helpful. Will see the specialist next week. 07-23-2021: Would like to know recommendations for effective management of back pain and discomfort - premedication prior to activity encouraged Evaluation of current treatment plan related to pain in back going down left leg and patient's adherence to plan as established by provider. 07-23-2021: Pain in legs with tingling resolved. The patient still having issues with back pain. Sees pcp next week. Will collaborate with the pcp for recommendations for the patient with effective management of her back pain.  Advised patient to call the office for worsening sx/sx of pain or changes noted  Provided education to patient re: alternative pain relief measures. The patient tried heat application last night but states it was not helpful. States the pain is going down into her left leg from her back.  Reviewed medications with patient and discussed compliance. The patient is taking a prednisone dose pack and Robaxin. She feels the Robaxin or the prednisone is not helping. 07-23-2021: The patient is taking Tylenol arthritis for back pain and this is effective in  helping her rest at night.  Collaborated with pcp regarding the patient states she can not see the pain specialist until 03-05-2021, ask about getting a "shot", will discuss with pcp for recommendations. 04-05-2021: Had procedure and is doing well post procedure. Will continue to monitor. 07-23-2021: Follow up with the pcp on 07-29-2021, the patient will discuss her pain concerns with the pcp.  Discussed plans with patient for ongoing care management follow up and  provided patient with direct contact information for care management team Allow patient to maintain a diary of pain ratings, timing, precipitating events, medications, treatments, and what works best to relieve pain,  Refer to support groups and self-help groups Educate patient about the use of pharmacological interventions for pain management- antianxiety, antidepressants, NSAIDS, opioid analgesics,  Explain the importance of lifestyle modifications to effective pain management  Patient Goals/Self Care Activities:  - mutually acceptable comfort goal set - pain assessed - pain management plan developed - pain treatment goals reviewed - patient response to treatment assessed - sharing of pain management plan with teachers and other caregivers encouraged Self-administers medications as prescribed Attends all scheduled provider appointments Calls pharmacy for medication refills Calls provider office for new concerns or questions Follow Up Plan: Telephone follow up appointment with care management team member scheduled for: 09-24-2021 at 1 pm      Task: RNCM: Partner to Develop Chronic Pain Management Plan Completed 07/23/2021  Outcome: Positive  Note:   Care Management Activities:    - mutually acceptable comfort goal set - pain assessed - pain management plan developed - pain treatment goals reviewed - patient response to treatment assessed - sharing of pain management plan with teachers and other caregivers encouraged        Care Plan : RNCM: Hypertension (Adult)  Updates made by Vanita Ingles since 07/23/2021 12:00 AM     Problem: RNCM: Hypertension (Hypertension)   Priority: Medium     Long-Range Goal: RNCM: Hypertension Monitored   Start Date: 02/15/2021  Expected End Date: 06/13/2022  This Visit's Progress: On track  Priority: Medium  Note:   Objective:  Last practice recorded BP readings:  BP Readings from Last 3 Encounters:  04/19/21 130/82  03/19/21 130/72   03/13/21 139/88     Most recent eGFR/CrCl: No results found for: EGFR  No components found for: CRCL Current Barriers:  Knowledge Deficits related to basic understanding of hypertension pathophysiology and self care management Knowledge Deficits related to understanding of medications prescribed for management of hypertension Limited Social Support Lacks social connections Unable to perform IADLs independently Does not contact provider office for questions/concerns Case Manager Clinical Goal(s):   patient will verbalize understanding of plan for hypertension management patient will attend all scheduled medical appointments: 07-29-2021 patient will demonstrate improved adherence to prescribed treatment plan for hypertension as evidenced by taking all medications as prescribed, monitoring and recording blood pressure as directed, adhering to low sodium/DASH diet  patient will demonstrate improved health management independence as evidenced by checking blood pressure as directed and notifying PCP if SBP>160 or DBP > 90, taking all medications as prescribe, and adhering to a low sodium diet as discussed.  patient will verbalize basic understanding of hypertension disease process and self health management plan as evidenced by compliance with medications, compliance with diet, and working with CCM team to manage health and well being  Interventions:  Collaboration with [name], NP regarding development and update of comprehensive plan of care as evidenced by provider attestation and  co-signature Inter-disciplinary care team collaboration (see longitudinal plan of care) Evaluation of current treatment plan related to hypertension self management and patient's adherence to plan as established by provider. 07-23-2021: The patient is stable and has no new concerns with her blood pressure. Adheres to heart healthy diet and denies any issues with medications compliance. Will continue to  monitor Provided education to patient re: stroke prevention, s/s of heart attack and stroke, DASH diet, complications of uncontrolled blood pressure Reviewed medications with patient and discussed importance of compliance. 07-23-2021: The patient verbalized she is compliant with medications.  Discussed plans with patient for ongoing care management follow up and provided patient with direct contact information for care management team Advised patient, providing education and rationale, to monitor blood pressure daily and record, calling PCP for findings outside established parameters.  Reviewed scheduled/upcoming provider appointments including: 07-29-2021 Patient Goals: - blood pressure trends reviewed - depression screen reviewed - home or ambulatory blood pressure monitoring encouraged Self-Care Activities: - Self administers medications as prescribed Attends all scheduled provider appointments Calls provider office for new concerns, questions, or BP outside discussed parameters Checks BP and records as discussed Follows a low sodium diet/DASH diet Follow Up Plan: Telephone follow up appointment with care management team member scheduled for:  09-24-2021 at 1 pm    Task: RNCM: Identify and Monitor Blood Pressure Elevation Completed 07/23/2021  Outcome: Positive  Note:   Care Management Activities:    - blood pressure trends reviewed - depression screen reviewed - home or ambulatory blood pressure monitoring encouraged        Care Plan : RNCM: Diabetes Type 2 (Adult)  Updates made by Vanita Ingles since 07/23/2021 12:00 AM     Problem: RNCM: Glycemic Management (Diabetes, Type 2)   Priority: High     Long-Range Goal: RNCM: Glycemic Management Optimized   Start Date: 02/15/2021  Expected End Date: 06/06/2022  This Visit's Progress: On track  Priority: High  Note:   Objective:  Lab Results  Component Value Date   HGBA1C 8.2 (H) 01/29/2021   05-08-2021 A1C 8.1 Lab Results   Component Value Date   CREATININE 2.45 (H) 02/12/2021   CREATININE 2.60 (H) 01/29/2021   CREATININE 2.56 (H) 10/25/2020   No results found for: EGFR Current Barriers:  Knowledge Deficits related to medications used for management of diabetes Does not use cbg meter  Limited Social Support Unable to independently manage blood sugars Unable to self administer medications as prescribed Does not adhere to provider recommendations re: checking blood sugars as recommended by the provider  Does not adhere to prescribed medication regimen Lacks social connections Case Manager Clinical Goal(s):  patient will demonstrate improved adherence to prescribed treatment plan for diabetes self care/management as evidenced by: daily monitoring and recording of CBG  adherence to ADA/ carb modified diet exercise 3/4 days/week adherence to prescribed medication regimen contacting provider for new or worsened symptoms or questions Interventions:  Collaboration with [name], NP regarding development and update of comprehensive plan of care as evidenced by provider attestation and co-signature Inter-disciplinary care team collaboration (see longitudinal plan of care) Provided education to patient about basic DM disease process Reviewed medications with patient and discussed importance of medication adherence. 07-23-2021: The patient is compliant with medications.  Discussed plans with patient for ongoing care management follow up and provided patient with direct contact information for care management team Provided patient with written educational materials related to hypo and hyperglycemia and importance of correct treatment.07-23-2021: The  patient states that after taking her "shot" sometimes her blood sugars have been dropping "low". She states 63 and sometimes lower.  She knows what to do and usually eats something, drinks something or takes glucose tablets. She also keeps tablets by her bed.  She states  this is happening on a consistent basis. Education provided. Will collaborate with pcp on information provided by the patient. The patient has an appointment on 07-29-2021.  The patient does desire to talk to her about her blood sugars, back pain, and she is having issues with being hoarse after talking. She states it is getting worse and she can no longer sing. Will send in basket message letting the pcp know of patients stated concerns. Education and support provided.  Reviewed scheduled/upcoming provider appointments including: 07-29-2021 at 11 am Advised patient, providing education and rationale, to check cbg bid and record, calling pcp for findings outside established parameters.  07-23-2021: The patient has been checking more frequently. The patients range is 63 to 140. This am her blood sugar was 113. Review of patient status, including review of consultants reports, relevant laboratory and other test results, and medications completed. Self-Care Activities - UNABLE to independently manage DM Self administers oral medications as prescribed Self administers insulin as prescribed Attends all scheduled provider appointments Checks blood sugars as prescribed and utilize hyper and hypoglycemia protocol as needed Adheres to prescribed ADA/carb modified Patient Goals: -- barriers to adherence to treatment plan identified - blood glucose monitoring encouraged - blood glucose readings reviewed - encourage participation in diabetes education classes - individualized medical nutrition therapy provided - resources required to improve adherence to care identified - self-awareness of signs/symptoms of hypo or hyperglycemia encouraged - use of blood glucose monitoring log promoted Follow Up Plan: Telephone follow up appointment with care management team member scheduled for: 09-24-2021 at 1 pm    Task: RNCM: Alleviate Barriers to Glycemic Management Completed 07/23/2021  Outcome: Positive  Note:   Care  Management Activities:    - barriers to adherence to treatment plan identified - blood glucose monitoring encouraged - blood glucose readings reviewed - encourage participation in diabetes education classes - individualized medical nutrition therapy provided - resources required to improve adherence to care identified - self-awareness of signs/symptoms of hypo or hyperglycemia encouraged - use of blood glucose monitoring log promoted         Plan:Telephone follow up appointment with care management team member scheduled for:  09-24-2021 at 1 pm  [place], MSN, [facility] Mobile: [phone]

## 2021-07-23 NOTE — Patient Instructions (Signed)
Visit Information  PATIENT GOALS:  Goals Addressed             This Visit's Progress    RNCM: Manage Chronic Pain       Timeframe:  Long-Range Goal Priority:  High Start Date:   02-15-2021                          Expected End Date:         07-13-2022              Follow Up Date 09-24-2021   - call for medicine refill 2 or 3 days before it runs out - develop a personal pain management plan - keep track of prescription refills - plan exercise or activity when pain is best controlled - prioritize tasks for the day - track times pain is worst and when it is best - track what makes the pain worse and what makes it better - use ice or heat for pain relief - work slower and less intense when having pain    Why is this important?   Day-to-day life can be hard when you have chronic pain.  Pain medicine is just one piece of the treatment puzzle.  You can try these action steps to help you manage your pain.    Notes: Pain onset x 2 days ago, denies falls or injury. 04-05-2021: Had a procedure recently and is doing well. Having tingling down her legs but not bad. Will discuss with the specialist at appointment on 04-09-2021. 07-23-2021: Pain down her legs has resolved but the patient is still dealing with a lot of back pain. Will discuss with the pcp on upcoming appointment.      RNCM: Monitor and Manage My Blood Sugar-Diabetes Type 2       Timeframe:  Long-Range Goal Priority:  High Start Date:  02-15-2021                           Expected End Date:  06-07-2022                    Follow Up Date 09-24-2021   - check blood sugar at prescribed times - check blood sugar before and after exercise - check blood sugar if I feel it is too high or too low - enter blood sugar readings and medication or insulin into daily log - take the blood sugar log to all doctor visits - take the blood sugar meter to all doctor visits    Why is this important?   Checking your blood sugar at home helps to  keep it from getting very high or very low.  Writing the results in a diary or log helps the doctor know how to care for you.  Your blood sugar log should have the time, date and the results.  Also, write down the amount of insulin or other medicine that you take.  Other information, like what you ate, exercise done and how you were feeling, will also be helpful.     Notes: Had not been consistently checking blood sugars but has started back now. Is checking blood sugars consistently now. The patient states the readings are in the 110's. 07-23-2021: The patient has been consistently checking. Praised for positive changes. The patient states that she has been having some low episodes sometimes at night but mostly in the day after taking her "shot".  She reports that it has been 63 or a little lower but states the highest it has been is 140. She states this am it was 113.  Last hemoglobin A1C was 8.1 on 05-08-2021. Will collaborate with the pcp concerning lows. Has a follow up appointment on 07-29-2021 with the pcp.        Patient verbalizes understanding of instructions provided today and agrees to view in [place].   Telephone follow up appointment with care management team member scheduled for: 09-24-2021 at 1 pm  [name] RN, MSN, [facility] Mobile: [phone]

## 2021-07-24 ENCOUNTER — Other Ambulatory Visit: Payer: Self-pay | Admitting: Nurse Practitioner

## 2021-07-24 NOTE — Telephone Encounter (Signed)
Requested Prescriptions  Pending Prescriptions Disp Refills  . clopidogrel (PLAVIX) 75 MG tablet [Pharmacy Med Name: Clopidogrel Bisulfate 75 MG Oral Tablet] 90 tablet 0    Sig: TAKE 1 TABLET BY MOUTH  DAILY     Hematology: Antiplatelets - clopidogrel Failed - 07/24/2021 10:20 PM      Failed - Evaluate AST, ALT within 2 months of therapy initiation.      Failed - HCT in normal range and within 180 days    HCT  Date Value Ref Range Status  02/12/2021 28.4 (L) 36.0 - 46.0 % Final   Hematocrit  Date Value Ref Range Status  02/15/2019 27.8 (L) 34.0 - 46.6 % Final         Failed - HGB in normal range and within 180 days    Hemoglobin  Date Value Ref Range Status  02/12/2021 8.8 (L) 12.0 - 15.0 g/dL Final  02/15/2019 8.9 (L) 11.1 - 15.9 g/dL Final         Passed - ALT in normal range and within 360 days    ALT  Date Value Ref Range Status  02/12/2021 12 0 - 44 U/L Final         Passed - AST in normal range and within 360 days    AST  Date Value Ref Range Status  02/12/2021 21 15 - 41 U/L Final         Passed - PLT in normal range and within 180 days    Platelets  Date Value Ref Range Status  02/12/2021 277 150 - 400 K/uL Final  02/15/2019 294 150 - 450 x10E3/uL Final         Passed - Valid encounter within last 6 months    Recent Outpatient Visits          5 months ago Poorly controlled type 2 diabetes mellitus with neuropathy ([place])   [place], [name] T, NP   9 months ago Poorly controlled type 2 diabetes mellitus with neuropathy ([place])   [place], [place] T, NP   9 months ago Right-sided chest pain   [facility] [place], Megan P, DO   12 months ago Poorly controlled type 2 diabetes mellitus with neuropathy ([place])   [place], [name] T, NP   1 year ago 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 omeprazole (PRILOSEC) 20 MG capsule [Pharmacy Med Name: Omeprazole 20 MG Oral Capsule Delayed Release] 180 capsule 0    Sig: TAKE 1 CAPSULE BY MOUTH  TWICE DAILY     Gastroenterology: Proton Pump Inhibitors Passed - 07/24/2021 10:20 PM      Passed - Valid encounter within last 12 months    Recent Outpatient Visits          5 months ago Poorly controlled type 2 diabetes mellitus with neuropathy ([place])   [place], [name] T, NP   9 months ago Poorly controlled type 2 diabetes mellitus with neuropathy ([place])   [place], [name] T, NP   9 months ago Right-sided chest pain   [facility] [place], Megan P, DO   12 months ago Poorly controlled type 2 diabetes mellitus with neuropathy ([place])   [place], [name] T, NP   1 year ago 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

## 2021-07-29 ENCOUNTER — Encounter: Payer: Self-pay | Admitting: Nurse Practitioner

## 2021-07-29 ENCOUNTER — Other Ambulatory Visit: Payer: Self-pay

## 2021-07-29 ENCOUNTER — Ambulatory Visit (INDEPENDENT_AMBULATORY_CARE_PROVIDER_SITE_OTHER): Payer: Medicare Other | Admitting: Nurse Practitioner

## 2021-07-29 VITALS — BP 140/82 | HR 81 | Temp 97.9°F | Ht 65.35 in | Wt 169.5 lb

## 2021-07-29 DIAGNOSIS — E1140 Type 2 diabetes mellitus with diabetic neuropathy, unspecified: Secondary | ICD-10-CM

## 2021-07-29 DIAGNOSIS — E785 Hyperlipidemia, unspecified: Secondary | ICD-10-CM

## 2021-07-29 DIAGNOSIS — N2581 Secondary hyperparathyroidism of renal origin: Secondary | ICD-10-CM

## 2021-07-29 DIAGNOSIS — D631 Anemia in chronic kidney disease: Secondary | ICD-10-CM

## 2021-07-29 DIAGNOSIS — E1165 Type 2 diabetes mellitus with hyperglycemia: Secondary | ICD-10-CM | POA: Diagnosis not present

## 2021-07-29 DIAGNOSIS — B0229 Other postherpetic nervous system involvement: Secondary | ICD-10-CM | POA: Diagnosis not present

## 2021-07-29 DIAGNOSIS — R490 Dysphonia: Secondary | ICD-10-CM | POA: Diagnosis not present

## 2021-07-29 DIAGNOSIS — N184 Chronic kidney disease, stage 4 (severe): Secondary | ICD-10-CM

## 2021-07-29 DIAGNOSIS — E1169 Type 2 diabetes mellitus with other specified complication: Secondary | ICD-10-CM

## 2021-07-29 DIAGNOSIS — I5032 Chronic diastolic (congestive) heart failure: Secondary | ICD-10-CM

## 2021-07-29 DIAGNOSIS — Z794 Long term (current) use of insulin: Secondary | ICD-10-CM | POA: Diagnosis not present

## 2021-07-29 DIAGNOSIS — I130 Hypertensive heart and chronic kidney disease with heart failure and stage 1 through stage 4 chronic kidney disease, or unspecified chronic kidney disease: Secondary | ICD-10-CM | POA: Diagnosis not present

## 2021-07-29 NOTE — Assessment & Plan Note (Signed)
Chronic, ongoing, followed by nephrology with recent labs obtained.  Continue collaboration with nephrology.   Recent note and labs reviewed. 

## 2021-07-29 NOTE — Assessment & Plan Note (Signed)
Chronic with CKD.  Continue iron supplement daily and collaboration with hematology and nephrology teams -- have highly recommended she schedule follow-up with Dr. [name].  Labs up to date and reviewed in Care Everywhere.

## 2021-07-29 NOTE — Assessment & Plan Note (Signed)
Chronic, ongoing.  Monitor closely due to AM low BS.  Continue collaboration with endocrinology. 

## 2021-07-29 NOTE — Assessment & Plan Note (Signed)
Chronic, ongoing.  Continue current medication regimen and collaboration with nephrology. Labs up to date and reviewed in Care Everywhere.  

## 2021-07-29 NOTE — Assessment & Plan Note (Signed)
Chronic with poor tolerance to Gabapentin and Lyrica and minimal relief with simple treatment methods.  Currently followed by pain management, continue this collaboration.   °

## 2021-07-29 NOTE — Progress Notes (Signed)
BP 140/82 (BP Location: Left Arm)   Pulse 81   Temp 97.9 F (36.6 C)   Ht 5' 5.35" (1.66 m)   Wt 169 lb 8 oz (76.9 kg)   LMP  (LMP Unknown)   SpO2 97%   BMI 27.90 kg/m    Subjective:    Patient ID: Tonya [name], female    DOB: 1946/06/30, 75 y.o.   MRN: [id number]  HPI: Tonya [name] is a 75 y.o. female  Chief Complaint  Patient presents with   Hypertension   Hyperlipidemia   Diabetes   Chronic Kidney Disease   Pain   HOARSENESS: Intermittent for several months, notices it the more she talks.  Can not sing lead in choir at church anymore as voice goes out.  She was treated for URI by nephrology on 04/15/21 -- with Azithromycin and Prednisone.  Denies allergies, does not take medication for this.  She reports tickling sensation at times in throat.  Never smoker, did live with a smoker for 18 years.  Denies alcohol use or drug use.  No sore throat, but feels like something there at times.  Denies congestion or rhinorrhea.  Has tried cough medication, which helps past cough, but does not stop hoarseness.  Takes daily Prilosec.    DIABETES Last saw Dr. [name]  with endo on 05/15/21 with A1C 8.1%.  Cardiology and PCP have recommended to endo discontinuation of Actos due to her HF, she reports this medication continues.  Does endorse she has been eating differently and focused more on diet.  Previously has tried Toujeo and Trulicity -- did not tolerate.   Freestyle in place.  Average glucose over past 90 days has been 151, have been above 45% of the time, at goal 31%, and low 24% of time.  Polydipsia/polyuria: no Visual disturbance: no Chest pain: no Paresthesias: no Glucose Monitoring: yes             Accucheck frequency: TID -- has had 35 low events on Freestyle over past 90 days             Fasting glucose:              Post prandial:             Evening: as above             Before meals:              Taking Insulin?: yes             Long acting insulin: Basaglar 8 units  in morning == she reports not taking this -- reports endo is aware             Short acting insulin: Humalog 8-10 units three times a day Blood Pressure Monitoring: a few times a week Retinal Examination: Up to Date -- 06/01/20 Foot Exam: Up to Date Pneumovax: Up to Date Influenza: Up to Date Aspirin: yes    HYPERTENSION / HYPERLIPIDEMIA/HF Followed by cardiology, Dr. [name] Situ, with last visit 04/19/21 with no changes made.  Continues on Carvedilol, Torsemide, and Crestor.  Her LVEF on 12/02/2019 was 60 to 65% with moderate LVH, and grade 1 diastolic dysfunction.  Discussed use of compression hose with her and she reports not using. Weighing self every day and denies increases. Satisfied with current treatment? yes Duration of hypertension: chronic BP monitoring frequency: a few times a week BP range: 130-150/80-90's BP medication side effects: no Duration of hyperlipidemia: chronic  Cholesterol medication side effects: no Cholesterol supplements: none Medication compliance: good compliance Aspirin: yes Recent stressors: no Recurrent headaches: no Visual changes: no Palpitations: no Dyspnea: no Chest pain: no Lower extremity edema: at baseline, small amount Dizzy/lightheaded: no    CHRONIC KIDNEY DISEASE Followed by nephrology and last saw 04/15/21 -- they treated her for URI at that visit with Azithromycin and Prednisone. Labs showing CRT 2.3 and GFR 25, PTH 91, BUN 59 with nephrology.  Is followed by hematology for anemia in CKD and last seen 12/27/2019.  Recent H/H with nephrology 8.7/28.0, MCV 87.8.   CKD status: stable Medications renally dose: yes Previous renal evaluation: yes Pneumovax:  Up to Date Influenza Vaccine:  Up to Date    POSTHERPETIC NEURALGIA: Followed by pain management and last seen by Dr. Holley Raring on 04/08/21 and 04/23/21 -- however these were telemedicine and she did not respond.  Had her last injection to back on 03/13/21.    Had shingles years ago and since that  time has had ongoing issues with pain to lower right back.  She is intolerant to Gabapentin and had issues with Pregabalin (change in mental status and hospital admission) + has tried Lidocaine patches with no benefit.  Has history of imaging to lumbar spine in 2016, where her pain is, and it showed "diffuse multilevel degenerative changes lumbar spine". Continues on Duloxetine.  Relevant past medical, surgical, family and social history reviewed and updated as indicated. Interim medical history since our last visit reviewed. Allergies and medications reviewed and updated.  Review of Systems  Constitutional:  Negative for activity change, appetite change, diaphoresis, fatigue and fever.  Respiratory:  Negative for cough, chest tightness, shortness of breath and wheezing.   Cardiovascular:  Negative for chest pain, palpitations and leg swelling.  Gastrointestinal: Negative.   Neurological: Negative.   Psychiatric/Behavioral: Negative.     Per HPI unless specifically indicated above     Objective:    BP 140/82 (BP Location: Left Arm)   Pulse 81   Temp 97.9 F (36.6 C)   Ht 5' 5.35" (1.66 m)   Wt 169 lb 8 oz (76.9 kg)   LMP  (LMP Unknown)   SpO2 97%   BMI 27.90 kg/m   Wt Readings from Last 3 Encounters:  07/29/21 169 lb 8 oz (76.9 kg)  04/19/21 171 lb 8 oz (77.8 kg)  03/19/21 170 lb (77.1 kg)    Physical Exam Vitals and nursing note reviewed.  Constitutional:      General: She is awake. She is not in acute distress.    Appearance: She is well-developed. She is not ill-appearing.  HENT:     Head: Normocephalic.     Right Ear: Hearing normal.     Left Ear: Hearing normal.     Mouth/Throat:     Mouth: Mucous membranes are moist.     Tongue: No lesions.     Pharynx: Posterior oropharyngeal erythema (mild lateral aspects) present. No pharyngeal swelling, oropharyngeal exudate or uvula swelling.  Eyes:     General: Lids are normal.        Right eye: No discharge.        Left  eye: No discharge.     Conjunctiva/sclera: Conjunctivae normal.     Pupils: Pupils are equal, round, and reactive to light.  Neck:     Thyroid: No thyromegaly.     Vascular: No carotid bruit.  Cardiovascular:     Rate and Rhythm: Normal rate and regular rhythm.  Heart sounds: Normal heart sounds. No murmur heard.   No gallop.  Pulmonary:     Effort: Pulmonary effort is normal. No accessory muscle usage or respiratory distress.     Breath sounds: Normal breath sounds.  Abdominal:     General: Bowel sounds are normal.     Palpations: Abdomen is soft.  Musculoskeletal:     Cervical back: Normal range of motion and neck supple.     Right lower leg: 1+ Edema present.     Left lower leg: 1+ Edema present.  Skin:    General: Skin is warm and dry.     Findings: No rash.  Neurological:     Mental Status: She is alert and oriented to person, place, and time.  Psychiatric:        Attention and Perception: Attention normal.        Mood and Affect: Mood normal.        Behavior: Behavior normal. Behavior is cooperative.        Thought Content: Thought content normal.        Judgment: Judgment normal.   Diabetic Foot Exam - Simple   Simple Foot Form Visual Inspection See comments: Yes Sensation Testing See comments: Yes Pulse Check Posterior Tibialis and Dorsalis pulse intact bilaterally: Yes Comments Sensation left 6/10 and right 7/10. 1+ edema.     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  Initial BP elevated, but repeat at goal range for age -- occasional elevations at home.   Continue current medication regimen and collaboration with nephrology.  Labs up to date and reviewed in Care Everywhere.  Recommend she continue to monitor BP at home daily and document for providers + focus on DASH diet.   Return in 3 months.      Chronic heart failure with preserved ejection fraction (HFpEF) (HCC)    Chronic, ongoing with recent EF 60-65%, euvolemic at this time.  Continue current heart medication regimen and collaboration with cardiology + CCM team in office.  She needs lots of education on HF, needs to have diet reiterated each visit. - Reminded to call for an overnight weight gain of >2 pounds or a weekly weight weight of >5 pounds -- work with CCM team on obtaining scale. - not adding salt to his food and has been reading food labels. Reviewed the importance of keeping daily sodium intake to 2000mg  daily  - Wear compression hose daily -- not on today and not consistently wearing        Endocrine   Poorly controlled type 2 diabetes mellitus with neuropathy ([place]) - Primary    Chronic, ongoing, followed by endocrinology.  A1C with endo in May 8.1%. Continue current medication regimen and defer changes to endo at upcoming visit due to ongoing lows (24%) over past month.  Cardiology and PCP have recommended to endo discontinuing Actos due to her HF.  Continue to collaborate with Dr. [name] and CCM team. Recommend she monitor BS at least 3 times a day at home, continue  consistent Libre use.  Return in 3 months as is being followed closely by endo.      Relevant Medications   Insulin Glargine (BASAGLAR KWIKPEN [place])   Hyperlipidemia associated with type 2 diabetes mellitus (HCC)    Chronic, ongoing.  Continue Rosuvastatin. Continue to collaborate with [facility].   Labs up to date and reviewed in Care Everywhere.       Relevant Medications   Insulin Glargine (BASAGLAR KWIKPEN [facility])   Hyperparathyroidism due to renal insufficiency (HCC)    Chronic, ongoing, followed by  nephrology with recent labs obtained.  Continue collaboration with nephrology.   Recent note and labs reviewed.        Nervous and Auditory   Post herpetic neuralgia    Chronic with poor tolerance to Gabapentin and Lyrica and minimal relief with simple treatment methods.  Currently followed by pain management, continue this collaboration.        Genitourinary   Chronic kidney disease, stage 4, severely decreased GFR (HCC)    Chronic, ongoing.  Continue current medication regimen and collaboration with nephrology. Labs up to date and reviewed in Care Everywhere.         Other   Encounter for long-term (current) use of insulin (HCC)    Chronic, ongoing.  Monitor closely due to AM low BS.  Continue collaboration with endocrinology.      Anemia in CKD (chronic kidney disease)    Chronic with CKD.  Continue iron supplement daily and collaboration with hematology and nephrology teams -- have highly recommended she schedule follow-up with Dr. [name].  Labs up to date and reviewed in Care Everywhere.       Other Visit Diagnoses     Hoarseness, persistent       Recommend taking daily Claritin 10 MG and will place referral to ENT.   Relevant Orders   Ambulatory referral to ENT        Follow up plan: Return in about 3 months (around 10/29/2021) for T2DM, HTN/HLD, HF, PAIN, CKD.

## 2021-07-29 NOTE — Assessment & Plan Note (Addendum)
Chronic, ongoing.  Initial BP elevated, but repeat at goal range for age -- occasional elevations at home.  Continue current medication regimen and collaboration with nephrology.  Labs up to date and reviewed in Care Everywhere.  Recommend she continue to monitor BP at home daily and document for providers + focus on DASH diet.   Return in 3 months.

## 2021-07-29 NOTE — Patient Instructions (Addendum)
START TAKING Claritin 10 MG daily.  Call [facility] for eye exam Call Dr. [name] to schedule follow-up for anemia. Reschedule pain clinic.  Diabetes Mellitus and Nutrition, Adult When you have diabetes, or diabetes mellitus, it is very important to have healthy eating habits because your blood sugar (glucose) levels are greatly affected by what you eat and drink. Eating healthy foods in the right amounts, at about the same times every day, can help you: Control your blood glucose. Lower your risk of heart disease. Improve your blood pressure. Reach or maintain a healthy weight. What can affect my meal plan? Every person with diabetes is different, and each person has different needs for a meal plan. Your health care provider may recommend that you work with a dietitian to make a meal plan that is best for you. Your meal plan may vary depending on factors such as: The calories you need. The medicines you take. Your weight. Your blood glucose, blood pressure, and cholesterol levels. Your activity level. Other health conditions you have, such as heart or kidney disease. How do carbohydrates affect me? Carbohydrates, also called carbs, affect your blood glucose level more than any other type of food. Eating carbs naturally raises the amount of glucose in your blood. Carb counting is a method for keeping track of how many carbs you eat. Counting carbs is important to keep your blood glucose at a healthy level,especially if you use insulin or take certain oral diabetes medicines. It is important to know how many carbs you can safely have in each meal. This is different for every person. Your dietitian can help you calculate how manycarbs you should have at each meal and for each snack. How does alcohol affect me? Alcohol can cause a sudden decrease in blood glucose (hypoglycemia), especially if you use insulin or take certain oral diabetes medicines. Hypoglycemia can be a life-threatening condition.  Symptoms of hypoglycemia, such as sleepiness, dizziness, and confusion, are similar to symptoms of having too much alcohol. Do not drink alcohol if: Your health care provider tells you not to drink. You are pregnant, may be pregnant, or are planning to become pregnant. If you drink alcohol: Do not drink on an empty stomach. Limit how much you use to: 0-1 drink a day for women. 0-2 drinks a day for men. Be aware of how much alcohol is in your drink. In the [place] one drink equals one 12 oz bottle of beer (355 mL), one 5 oz glass of wine (148 mL), or one 1 oz glass of hard liquor (44 mL). Keep yourself hydrated with water, diet soda, or unsweetened iced tea. Keep in mind that regular soda, juice, and other mixers may contain a lot of sugar and must be counted as carbs. What are tips for following this plan?  Reading food labels Start by checking the serving size on the "Nutrition Facts" label of packaged foods and drinks. The amount of calories, carbs, fats, and other nutrients listed on the label is based on one serving of the item. Many items contain more than one serving per package. Check the total grams (g) of carbs in one serving. You can calculate the number of servings of carbs in one serving by dividing the total carbs by 15. For example, if a food has 30 g of total carbs per serving, it would be equal to 2 servings of carbs. Check the number of grams (g) of saturated fats and trans fats in one serving. Choose foods that have  a low amount or none of these fats. Check the number of milligrams (mg) of salt (sodium) in one serving. Most people should limit total sodium intake to less than 2,300 mg per day. Always check the nutrition information of foods labeled as "low-fat" or "nonfat." These foods may be higher in added sugar or refined carbs and should be avoided. Talk to your dietitian to identify your daily goals for nutrients listed on the label. Shopping Avoid buying canned, pre-made,  or processed foods. These foods tend to be high in fat, sodium, and added sugar. Shop around the outside edge of the grocery store. This is where you will most often find fresh fruits and vegetables, bulk grains, fresh meats, and fresh dairy. Cooking Use low-heat cooking methods, such as baking, instead of high-heat cooking methods like deep frying. Cook using healthy oils, such as olive, canola, or sunflower oil. Avoid cooking with butter, cream, or high-fat meats. Meal planning Eat meals and snacks regularly, preferably at the same times every day. Avoid going long periods of time without eating. Eat foods that are high in fiber, such as fresh fruits, vegetables, beans, and whole grains. Talk with your dietitian about how many servings of carbs you can eat at each meal. Eat 4-6 oz (112-168 g) of lean protein each day, such as lean meat, chicken, fish, eggs, or tofu. One ounce (oz) of lean protein is equal to: 1 oz (28 g) of meat, chicken, or fish. 1 egg.  cup (62 g) of tofu. Eat some foods each day that contain healthy fats, such as avocado, nuts, seeds, and fish. What foods should I eat? Fruits Berries. Apples. Oranges. Peaches. Apricots. Plums. Grapes. Mango. Papaya.Pomegranate. Kiwi. Cherries. Vegetables Lettuce. Spinach. Leafy greens, including kale, chard, collard greens, and mustard greens. Beets. Cauliflower. Cabbage. Broccoli. Carrots. Green beans.Tomatoes. Peppers. Onions. Cucumbers. Brussels sprouts. Grains Whole grains, such as whole-wheat or whole-grain bread, crackers, tortillas,cereal, and pasta. Unsweetened oatmeal. Quinoa. Brown or wild rice. Meats and other proteins Seafood. Poultry without skin. Lean cuts of poultry and beef. Tofu. Nuts. Seeds. Dairy Low-fat or fat-free dairy products such as milk, yogurt, and cheese. The items listed above may not be a complete list of foods and beverages you can eat. Contact a dietitian for more information. What foods should I  avoid? Fruits Fruits canned with syrup. Vegetables Canned vegetables. Frozen vegetables with butter or cream sauce. Grains Refined white flour and flour products such as bread, pasta, snack foods, andcereals. Avoid all processed foods. Meats and other proteins Fatty cuts of meat. Poultry with skin. Breaded or fried meats. Processed meat.Avoid saturated fats. Dairy Full-fat yogurt, cheese, or milk. Beverages Sweetened drinks, such as soda or iced tea. The items listed above may not be a complete list of foods and beverages you should avoid. Contact a dietitian for more information. Questions to ask a health care provider Do I need to meet with a diabetes educator? Do I need to meet with a dietitian? What number can I call if I have questions? When are the best times to check my blood glucose? Where to find more information: American Diabetes Association: diabetes.org Academy of Nutrition and Dietetics: www.eatright.[employer] of Diabetes and Digestive and Kidney Diseases: [place] Association of Diabetes Care and Education Specialists: www.diabeteseducator.org Summary It is important to have healthy eating habits because your blood sugar (glucose) levels are greatly affected by what you eat and drink. A healthy meal plan will help you control your blood glucose and maintain a healthy  lifestyle. Your health care provider may recommend that you work with a dietitian to make a meal plan that is best for you. Keep in mind that carbohydrates (carbs) and alcohol have immediate effects on your blood glucose levels. It is important to count carbs and to use alcohol carefully. This information is not intended to replace advice given to you by your health care provider. Make sure you discuss any questions you have with your healthcare provider. Document Revised: 11/08/2019 Document Reviewed: 11/08/2019 Elsevier Patient Education  2021 [employer].

## 2021-07-29 NOTE — Assessment & Plan Note (Signed)
Chronic, ongoing.  Continue Rosuvastatin. Continue to collaborate with [facility].   Labs up to date and reviewed in Care Everywhere.

## 2021-07-29 NOTE — Assessment & Plan Note (Signed)
Chronic, ongoing, followed by endocrinology.  A1C with endo in May 8.1%. Continue current medication regimen and defer changes to endo at upcoming visit due to ongoing lows (24%) over past month.  Cardiology and PCP have recommended to endo discontinuing Actos due to her HF.  Continue to collaborate with Dr. [name] and CCM team. Recommend she monitor BS at least 3 times a day at home, continue consistent [place] use.  Return in 3 months as is being followed closely by endo.

## 2021-07-29 NOTE — Assessment & Plan Note (Signed)
Chronic, ongoing with recent EF 60-65%, euvolemic at this time.  Continue current heart medication regimen and collaboration with cardiology + CCM team in office.  She needs lots of education on HF, needs to have diet reiterated each visit. - Reminded to call for an overnight weight gain of >2 pounds or a weekly weight weight of >5 pounds -- work with CCM team on obtaining scale. - not adding salt to his food and has been reading food labels. Reviewed the importance of keeping daily sodium intake to 2000mg  daily  - Wear compression hose daily -- not on today and not consistently wearing

## 2021-07-31 ENCOUNTER — Ambulatory Visit: Payer: Medicare Other

## 2021-07-31 DIAGNOSIS — J301 Allergic rhinitis due to pollen: Secondary | ICD-10-CM | POA: Diagnosis not present

## 2021-07-31 DIAGNOSIS — K219 Gastro-esophageal reflux disease without esophagitis: Secondary | ICD-10-CM | POA: Diagnosis not present

## 2021-07-31 DIAGNOSIS — R490 Dysphonia: Secondary | ICD-10-CM | POA: Diagnosis not present

## 2021-07-31 DIAGNOSIS — H6123 Impacted cerumen, bilateral: Secondary | ICD-10-CM | POA: Diagnosis not present

## 2021-08-02 ENCOUNTER — Ambulatory Visit: Payer: Medicare Other

## 2021-08-07 DIAGNOSIS — E113293 Type 2 diabetes mellitus with mild nonproliferative diabetic retinopathy without macular edema, bilateral: Secondary | ICD-10-CM | POA: Diagnosis not present

## 2021-08-07 DIAGNOSIS — Z961 Presence of intraocular lens: Secondary | ICD-10-CM | POA: Diagnosis not present

## 2021-08-07 LAB — HM DIABETES EYE EXAM

## 2021-08-08 ENCOUNTER — Other Ambulatory Visit: Payer: Self-pay

## 2021-08-08 ENCOUNTER — Encounter: Payer: Self-pay | Admitting: Student in an Organized Health Care Education/Training Program

## 2021-08-08 ENCOUNTER — Ambulatory Visit
Payer: Medicare Other | Attending: Student in an Organized Health Care Education/Training Program | Admitting: Student in an Organized Health Care Education/Training Program

## 2021-08-08 VITALS — BP 167/89 | HR 95 | Temp 96.8°F | Resp 16 | Ht 67.0 in | Wt 170.0 lb

## 2021-08-08 DIAGNOSIS — G894 Chronic pain syndrome: Secondary | ICD-10-CM | POA: Insufficient documentation

## 2021-08-08 DIAGNOSIS — M47816 Spondylosis without myelopathy or radiculopathy, lumbar region: Secondary | ICD-10-CM | POA: Diagnosis not present

## 2021-08-08 NOTE — Progress Notes (Signed)
PROVIDER NOTE: Information contained herein reflects review and annotations entered in association with encounter. Interpretation of such information and data should be left to medically-trained personnel. Information provided to patient can be located elsewhere in the medical record under "Patient Instructions". Document created using STT-dictation technology, any transcriptional errors that may result from process are unintentional.    Patient: Tonya [name]  Service Category: E/M  Provider: Gillis Santa, MD  DOB: 31-Jan-1946  DOS: 08/08/2021  Specialty: Interventional Pain Management  MRN: [id number]  Setting: Ambulatory outpatient  PCP: [name], NP  Type: Established Patient    Referring Provider: [name], NP  Location: Office  Delivery: Face-to-face     HPI  Reason for encounter: [name], a 75 y.o. year old female, is here today for evaluation and management of her Lumbar facet arthropathy [M47.816]. [name] primary complain today is Back Pain (Low left) Last encounter: Practice (03/07/2020). My last encounter with her was on 04/08/21 Pertinent problems: [name] has Chronic pain syndrome; Neuropathic pain; Lumbar degenerative disc disease; and Lumbar spondylosis on their pertinent problem list. Pain Assessment: Severity of Chronic pain is reported as a 6 /10. Location: Back Lower, Left/denies. Onset: More than a month ago. Quality: Aching. Timing: Constant. Modifying factor(s): medication. Vitals:  height is $RemoveB'5\' 7"'HyvbVdii$  (1.702 m) and weight is 170 lb (77.1 kg). Her temperature is 96.8 F (36 C) (abnormal). Her blood pressure is 167/89 (abnormal) and her pulse is 95. Her respiration is 16 and oxygen saturation is 100%.   Patient presents with increased right> left lower back pain that is worse with facet loading related to lumbar facet arthropathy, lumbar spondylosis.  She is status post right L3, L4, L5 lumbar facet medial branch nerve block 06/27/2020, over 1 year  ago.  This provided her with significant pain relief, rated as proximately 85% for more than 3 months with a gradual return of her pain thereafter.  She also noted functional improvement during this time.  Given that he has been longer than 1 year since her previous diagnostic/therapeutic right L3, L4, L5 medial branch nerve block, recommend repeating.  Risks and benefits reviewed.  Patient would like to proceed 3.  Instructed to stop her Plavix 7 days prior.   ROS  Constitutional: Denies any fever or chills Gastrointestinal: No reported hemesis, hematochezia, vomiting, or acute GI distress Musculoskeletal:  Low back pain, right greater than left Neurological: No reported episodes of acute onset apraxia, aphasia, dysarthria, agnosia, amnesia, paralysis, loss of coordination, or loss of consciousness  Medication Review  Insulin Glargine, Insulin Pen Needle, Torsemide, aspirin, carvedilol, clopidogrel, docusate sodium, ferrous sulfate, insulin lispro, olmesartan, omeprazole, pioglitazone, rosuvastatin, and vitamin D3  History Review  Allergy: [name] is allergic to atorvastatin, gabapentin, pravastatin, pregabalin, and trulicity [dulaglutide]. Drug: [name]  reports no history of drug use. Alcohol:  reports no history of alcohol use. Tobacco:  reports that she has never smoked. She has never used smokeless tobacco. Social: [name]  reports that she has never smoked. She has never used smokeless tobacco. She reports that she does not drink alcohol and does not use drugs. Medical:  has a past medical history of (HFpEF) heart failure with preserved ejection fraction ([place]), CKD (chronic kidney disease), stage III ([place]), Diabetes mellitus without complication ([place]), Hyperlipidemia, Hypertension, Stroke ([place]), and Wears dentures. Surgical: [name]  has a past surgical history that includes Tubal ligation; Colonoscopy (12/15/2006); Colonoscopy with propofol (N/A, 06/06/2019); Colonoscopy with propofol  (N/A,  01/16/2020); polypectomy (N/A, 01/16/2020); Cataract extraction w/PHACO (Left, 02/05/2021); and Cataract extraction w/PHACO (Right, 03/19/2021). Family: family history includes Diabetes in her brother, brother, mother, and sister; Gout in her son; Heart attack in her sister; Hyperlipidemia in her father; Hypertension in her father; Kidney disease in her son; Lymphoma in her sister; Stroke in her mother.  Laboratory Chemistry Profile   Renal Lab Results  Component Value Date   BUN 53 (H) 02/12/2021   CREATININE 2.45 (H) 02/12/2021   BCR 22 01/29/2021   GFRAA 20 (L) 01/29/2021   GFRNONAA 20 (L) 02/12/2021     Hepatic Lab Results  Component Value Date   AST 21 02/12/2021   ALT 12 02/12/2021   ALBUMIN 3.4 (L) 02/12/2021   ALKPHOS 62 02/12/2021     Electrolytes Lab Results  Component Value Date   NA 139 02/12/2021   K 4.3 02/12/2021   CL 109 02/12/2021   CALCIUM 8.7 (L) 02/12/2021   MG 2.3 12/03/2019     Bone Lab Results  Component Value Date   VD25OH 26.9 (L) 05/18/2019     Inflammation (CRP: Acute Phase) (ESR: Chronic Phase) Lab Results  Component Value Date   CRP 1.1 04/14/2018   ESRSEDRATE 14 04/14/2018       Note: Above Lab results reviewed.  Recent Imaging Review  DG PAIN CLINIC C-ARM 1-60 MIN NO REPORT Fluoro was used, but no Radiologist interpretation will be provided.  Please refer to "NOTES" tab for provider progress note. Note: Reviewed        Physical Exam  General appearance: Well nourished, well developed, and well hydrated. In no apparent acute distress Mental status: Alert, oriented x 3 (person, place, & time)       Respiratory: No evidence of acute respiratory distress Eyes: PERLA Vitals: BP (!) 167/89 (BP Location: Right Arm, Patient Position: Sitting, Cuff Size: Normal)   Pulse 95   Temp (!) 96.8 F (36 C)   Resp 16   Ht $R'5\' 7"'JX$  (1.702 m)   Wt 170 lb (77.1 kg)   LMP  (LMP Unknown)   SpO2 100%   BMI 26.63 kg/m  BMI: Estimated body mass  index is 26.63 kg/m as calculated from the following:   Height as of this encounter: $RemoveBeforeD'5\' 7"'yLPAdVOgvflNwr$  (1.702 m).   Weight as of this encounter: 170 lb (77.1 kg). Ideal: Ideal body weight: 61.6 kg (135 lb 12.9 oz) Adjusted ideal body weight: 67.8 kg (149 lb 7.7 oz)  Lumbar Spine Area Exam  Skin & Axial Inspection: No masses, redness, or swelling Alignment: Symmetrical Functional ROM: Pain restricted ROM affecting primarily the right Stability: No instability detected Muscle Tone/Strength: Functionally intact. No obvious neuro-muscular anomalies detected. Sensory (Neurological): Musculoskeletal pain pattern Palpation: Complains of area being tender to palpation       Provocative Tests: Hyperextension/rotation test: (+) on the right for facet joint pain. Lumbar quadrant test (Kemp's test): (+) on the right for facet joint pain.  Gait & Posture Assessment  Ambulation: Limited Gait: Significantly limited. Dependent on assistive device to ambulate Posture: Difficulty standing up straight, due to pain  Lower Extremity Exam    Side: Right lower extremity  Side: Left lower extremity  Stability: No instability observed          Stability: No instability observed          Skin & Extremity Inspection: Skin color, temperature, and hair growth are WNL. No peripheral edema or cyanosis. No masses, redness, swelling, asymmetry, or associated skin lesions.  No contractures.  Skin & Extremity Inspection: Skin color, temperature, and hair growth are WNL. No peripheral edema or cyanosis. No masses, redness, swelling, asymmetry, or associated skin lesions. No contractures.  Functional ROM: Pain restricted ROM                  Functional ROM: Unrestricted ROM                  Muscle Tone/Strength: Functionally intact. No obvious neuro-muscular anomalies detected.  Muscle Tone/Strength: Functionally intact. No obvious neuro-muscular anomalies detected.  Sensory (Neurological): Unimpaired        Sensory (Neurological):  Unimpaired        DTR: Patellar: deferred today Achilles: deferred today Plantar: deferred today  DTR: Patellar: deferred today Achilles: deferred today Plantar: deferred today  Palpation: No palpable anomalies  Palpation: No palpable anomalies    Assessment   Status Diagnosis  Having a Flare-up Having a Flare-up Persistent 1. Lumbar facet arthropathy   2. Lumbar spondylosis   3. Chronic pain syndrome      Updated Problems: Problem  Lumbar Spondylosis    Plan of Care   [name] has a current medication list which includes the following long-term medication(s): carvedilol, ferrous sulfate, olmesartan, omeprazole, rosuvastatin, and torsemide.  Orders:  Orders Placed This Encounter  Procedures   LUMBAR FACET(MEDIAL BRANCH NERVE BLOCK) MBNB    Standing Status:   Future    Standing Expiration Date:   09/08/2021    Scheduling Instructions:     Procedure: Lumbar facet block (AKA.: Lumbosacral medial branch nerve block)     Side: RIGHT     Level: L3-4, L4-5, & L5-S1 Facets (L3, L4, L5, Medial Branch Nerves)     Sedation: with     Timeframe: ASAA    Order Specific Question:   Where will this procedure be performed?    Answer:   [facility]   Follow-up plan:   Return in about 13 days (around 08/21/2021) for Right L3, 4, 5 MBNB #2, without sedation (stop Plavix 7 days prior).     Postherpetic neuralgia: Failed gabapentin, Lyrica, Cymbalta, tramadol.    Recent Visits No visits were found meeting these conditions. Showing recent visits within past 90 days and meeting all other requirements Today's Visits Date Type Provider Dept  08/08/21 Office Visit Gillis Santa, MD [facility]  Showing today's visits and meeting all other requirements Future Appointments No visits were found meeting these conditions. Showing future appointments within next 90 days and meeting all other requirements I discussed the assessment and treatment plan with the  patient. The patient was provided an opportunity to ask questions and all were answered. The patient agreed with the plan and demonstrated an understanding of the instructions.  Patient advised to call back or seek an in-person evaluation if the symptoms or condition worsens.  Duration of encounter: 30 minutes.  Note by: Gillis Santa, MD Date: 08/08/2021; Time: 12:02 PM

## 2021-08-08 NOTE — Patient Instructions (Addendum)
Increase Tylenol arthritis to 2 tablets in morning and 2 tablets at night Stop Plavix 7 days prior ______________________________________________________________________  Preparing for your procedure (without sedation)  Procedure appointments are limited to planned procedures: No Prescription Refills. No disability issues will be discussed. No medication changes will be discussed.  Instructions: Oral Intake: Do not eat or drink anything for at least 2 hours prior to your procedure. (Exception: Blood Pressure Medication. See below.) Transportation: Unless otherwise stated by your physician, you may drive yourself after the procedure. Blood Pressure Medicine: Do not forget to take your blood pressure medicine with a sip of water the morning of the procedure. If your Diastolic (lower reading)is above 100 mmHg, elective cases will be cancelled/rescheduled. Blood thinners: These will need to be stopped for procedures. Notify our staff if you are taking any blood thinners. Depending on which one you take, there will be specific instructions on how and when to stop it. Diabetics on insulin: Notify the staff so that you can be scheduled 1st case in the morning. If your diabetes requires high dose insulin, take only  of your normal insulin dose the morning of the procedure and notify the staff that you have done so. Preventing infections: Shower with an antibacterial soap the morning of your procedure.  Build-up your immune system: Take 1000 mg of Vitamin C with every meal (3 times a day) the day prior to your procedure. Antibiotics: Inform the staff if you have a condition or reason that requires you to take antibiotics before dental procedures. Pregnancy: If you are pregnant, call and cancel the procedure. Sickness: If you have a cold, fever, or any active infections, call and cancel the procedure. Arrival: You must be in the facility at least 30 minutes prior to your scheduled procedure. Children:  Do not bring any children with you. Dress appropriately: Bring dark clothing that you would not mind if they get stained. Valuables: Do not bring any jewelry or valuables.  Reasons to call and reschedule or cancel your procedure: (Following these recommendations will minimize the risk of a serious complication.) Surgeries: Avoid having procedures within 2 weeks of any surgery. (Avoid for 2 weeks before or after any surgery). Flu Shots: Avoid having procedures within 2 weeks of a flu shots or . (Avoid for 2 weeks before or after immunizations). Barium: Avoid having a procedure within 7-10 days after having had a radiological study involving the use of radiological contrast. (Myelograms, Barium swallow or enema study). Heart attacks: Avoid any elective procedures or surgeries for the initial 6 months after a "Myocardial Infarction" (Heart Attack). Blood thinners: It is imperative that you stop these medications before procedures. Let us know if you if you take any blood thinner.  Infection: Avoid procedures during or within two weeks of an infection (including chest colds or gastrointestinal problems). Symptoms associated with infections include: Localized redness, fever, chills, night sweats or profuse sweating, burning sensation when voiding, cough, congestion, stuffiness, runny nose, sore throat, diarrhea, nausea, vomiting, cold or Flu symptoms, recent or current infections. It is specially important if the infection is over the area that we intend to treat. Heart and lung problems: Symptoms that may suggest an active cardiopulmonary problem include: cough, chest pain, breathing difficulties or shortness of breath, dizziness, ankle swelling, uncontrolled high or unusually low blood pressure, and/or palpitations. If you are experiencing any of these symptoms, cancel your procedure and contact your primary care physician for an evaluation.  Remember:  Regular Business hours are:  Monday to  Thursday  8:00 AM to 4:00 PM  Provider's Schedule: Milinda Pointer, MD:  Procedure days: Tuesday and Thursday 7:30 AM to 4:00 PM  Gillis Santa, MD:  Procedure days: Monday and Wednesday 7:30 AM to 4:00 PM ______________________________________________________________________

## 2021-08-16 DIAGNOSIS — Z794 Long term (current) use of insulin: Secondary | ICD-10-CM | POA: Diagnosis not present

## 2021-08-16 DIAGNOSIS — E1165 Type 2 diabetes mellitus with hyperglycemia: Secondary | ICD-10-CM | POA: Diagnosis not present

## 2021-08-23 DIAGNOSIS — E1165 Type 2 diabetes mellitus with hyperglycemia: Secondary | ICD-10-CM | POA: Diagnosis not present

## 2021-08-23 DIAGNOSIS — Z794 Long term (current) use of insulin: Secondary | ICD-10-CM | POA: Diagnosis not present

## 2021-08-30 DIAGNOSIS — E1165 Type 2 diabetes mellitus with hyperglycemia: Secondary | ICD-10-CM | POA: Diagnosis not present

## 2021-08-30 DIAGNOSIS — E1121 Type 2 diabetes mellitus with diabetic nephropathy: Secondary | ICD-10-CM | POA: Diagnosis not present

## 2021-08-30 DIAGNOSIS — R809 Proteinuria, unspecified: Secondary | ICD-10-CM | POA: Diagnosis not present

## 2021-08-30 DIAGNOSIS — Z794 Long term (current) use of insulin: Secondary | ICD-10-CM | POA: Diagnosis not present

## 2021-08-30 DIAGNOSIS — E113393 Type 2 diabetes mellitus with moderate nonproliferative diabetic retinopathy without macular edema, bilateral: Secondary | ICD-10-CM | POA: Diagnosis not present

## 2021-08-30 DIAGNOSIS — E1129 Type 2 diabetes mellitus with other diabetic kidney complication: Secondary | ICD-10-CM | POA: Diagnosis not present

## 2021-09-02 ENCOUNTER — Ambulatory Visit (HOSPITAL_BASED_OUTPATIENT_CLINIC_OR_DEPARTMENT_OTHER): Payer: Medicare Other | Admitting: Student in an Organized Health Care Education/Training Program

## 2021-09-02 ENCOUNTER — Ambulatory Visit
Admission: RE | Admit: 2021-09-02 | Discharge: 2021-09-02 | Disposition: A | Discharge status: Home / Self Care | Payer: Medicare Other | Source: Ambulatory Visit | Attending: Student in an Organized Health Care Education/Training Program | Admitting: Student in an Organized Health Care Education/Training Program

## 2021-09-02 ENCOUNTER — Other Ambulatory Visit: Payer: Self-pay

## 2021-09-02 DIAGNOSIS — G894 Chronic pain syndrome: Secondary | ICD-10-CM | POA: Insufficient documentation

## 2021-09-02 DIAGNOSIS — M47816 Spondylosis without myelopathy or radiculopathy, lumbar region: Secondary | ICD-10-CM

## 2021-09-02 MED ORDER — DIAZEPAM 5 MG PO TABS
ORAL_TABLET | ORAL | Status: AC
  Filled 2021-09-02: qty 1

## 2021-09-02 MED ORDER — DIAZEPAM 5 MG PO TABS
5.0000 mg | ORAL_TABLET | ORAL | Status: AC
  Administered 2021-09-02: 5 mg via ORAL

## 2021-09-02 MED ORDER — ROPIVACAINE HCL 2 MG/ML IJ SOLN
INTRAMUSCULAR | Status: AC
  Filled 2021-09-02: qty 20

## 2021-09-02 MED ORDER — LIDOCAINE HCL 2 % IJ SOLN
20.0000 mL | Freq: Once | INTRAMUSCULAR | Status: AC
  Administered 2021-09-02: 200 mg
  Filled 2021-09-02: qty 20

## 2021-09-02 MED ORDER — ROPIVACAINE HCL 2 MG/ML IJ SOLN
9.0000 mL | Freq: Once | INTRAMUSCULAR | Status: AC
  Administered 2021-09-02: 9 mL via PERINEURAL

## 2021-09-02 MED ORDER — LIDOCAINE HCL (PF) 2 % IJ SOLN
INTRAMUSCULAR | Status: AC
  Filled 2021-09-02: qty 20

## 2021-09-02 MED ORDER — DEXAMETHASONE SODIUM PHOSPHATE 10 MG/ML IJ SOLN
10.0000 mg | Freq: Once | INTRAMUSCULAR | Status: AC
  Administered 2021-09-02: 10 mg
  Filled 2021-09-02: qty 1

## 2021-09-02 NOTE — Progress Notes (Signed)
Safety precautions to be maintained throughout the outpatient stay will include: orient to surroundings, keep bed in low position, maintain call bell within reach at all times, provide assistance with transfer out of bed and ambulation.  

## 2021-09-02 NOTE — Progress Notes (Signed)
PROVIDER NOTE: Information contained herein reflects review and annotations entered in association with encounter. Interpretation of such information and data should be left to medically-trained personnel. Information provided to patient can be located elsewhere in the medical record under "Patient Instructions". Document created using STT-dictation technology, any transcriptional errors that may result from process are unintentional.    Patient: Tonya [name]  Service Category: Procedure  Provider: Gillis Santa, MD  DOB: 29-Dec-1945  DOS: 09/02/2021  Location: [facility]  MRN: [id number]  Setting: Ambulatory - outpatient  Referring Provider: Gillis Santa, MD  Type: Established Patient  Specialty: Interventional Pain Management  PCP: [name], NP   Primary Reason for Visit: Interventional Pain Management Treatment. CC: Back Pain (lower)  Procedure:          Anesthesia, Analgesia, Anxiolysis:  Type: Lumbar Facet, Medial Branch Block(s) #2 (#1 done 06/27/20 ) Primary Purpose: Diagnostic Region: Posterolateral Lumbosacral Spine Level:  L3, L4, L5,  Medial Branch Level(s). Injecting these levels blocks the L3-4, L4-5 lumbar facet joints. Laterality: Right  Type: Local Anesthesia with PO Valium 5 mg  Local Anesthetic: Lidocaine 1-2%  Position: Prone   Indications: 1. Lumbar facet arthropathy   2. Lumbar spondylosis   3. Chronic pain syndrome    Pain Score: Pre-procedure: 10-Worst pain ever/10 Post-procedure: 3  (moving)/10   Patient stopped Plavix 7 days prior to procedure.  Pre-op Assessment:  Tonya [name] is a 75 y.o. (year old), female patient, seen today for interventional treatment. She  has a past surgical history that includes Tubal ligation; Colonoscopy (12/15/2006); Colonoscopy with propofol (N/A, 06/06/2019); Colonoscopy with propofol (N/A, 01/16/2020); polypectomy (N/A, 01/16/2020); Cataract extraction w/PHACO (Left, 02/05/2021); and Cataract extraction w/PHACO  (Right, 03/19/2021). Tonya [name] has a current medication list which includes the following prescription(s): aspirin, carvedilol, clopidogrel, docusate sodium, ferrous sulfate, humalog kwikpen, insulin glargine, novofine, olmesartan, omeprazole, pioglitazone, rosuvastatin, torsemide, and vitamin d3. Her primarily concern today is the Back Pain (lower)  Initial Vital Signs:  Pulse/HCG Rate: 91  Temp: (!) 96.8 F (36 C) Resp: 14 BP: 118/85 SpO2: 100 %  BMI: Estimated body mass index is 25.84 kg/m as calculated from the following:   Height as of this encounter: 5\' 7"  (1.702 m).   Weight as of this encounter: 165 lb (74.8 kg).  Risk Assessment: Allergies: Reviewed. She is allergic to atorvastatin, gabapentin, pravastatin, pregabalin, and trulicity [dulaglutide].  Allergy Precautions: None required Coagulopathies: Reviewed. None identified.  Blood-thinner therapy: None at this time Active Infection(s): Reviewed. None identified. Tonya [name] is afebrile  Site Confirmation: Tonya [name] was asked to confirm the procedure and laterality before marking the site Procedure checklist: Completed Consent: Before the procedure and under the influence of no sedative(s), amnesic(s), or anxiolytics, the patient was informed of the treatment options, risks and possible complications. To fulfill our ethical and legal obligations, as recommended by the American Medical Association's Code of Ethics, I have informed the patient of my clinical impression; the nature and purpose of the treatment or procedure; the risks, benefits, and possible complications of the intervention; the alternatives, including doing nothing; the risk(s) and benefit(s) of the alternative treatment(s) or procedure(s); and the risk(s) and benefit(s) of doing nothing. The patient was provided information about the general risks and possible complications associated with the procedure. These may include, but are not limited to: failure to achieve  desired goals, infection, bleeding, organ or nerve damage, allergic reactions, paralysis, and death. In addition, the patient was informed of those risks and  complications associated to Spine-related procedures, such as failure to decrease pain; infection (i.e.: Meningitis, epidural or intraspinal abscess); bleeding (i.e.: epidural hematoma, subarachnoid hemorrhage, or any other type of intraspinal or peri-dural bleeding); organ or nerve damage (i.e.: Any type of peripheral nerve, nerve root, or spinal cord injury) with subsequent damage to sensory, motor, and/or autonomic systems, resulting in permanent pain, numbness, and/or weakness of one or several areas of the body; allergic reactions; (i.e.: anaphylactic reaction); and/or death. Furthermore, the patient was informed of those risks and complications associated with the medications. These include, but are not limited to: allergic reactions (i.e.: anaphylactic or anaphylactoid reaction(s)); adrenal axis suppression; blood sugar elevation that in diabetics may result in ketoacidosis or comma; water retention that in patients with history of congestive heart failure may result in shortness of breath, pulmonary edema, and decompensation with resultant heart failure; weight gain; swelling or edema; medication-induced neural toxicity; particulate matter embolism and blood vessel occlusion with resultant organ, and/or nervous system infarction; and/or aseptic necrosis of one or more joints. Finally, the patient was informed that Medicine is not an exact science; therefore, there is also the possibility of unforeseen or unpredictable risks and/or possible complications that may result in a catastrophic outcome. The patient indicated having understood very clearly. We have given the patient no guarantees and we have made no promises. Enough time was given to the patient to ask questions, all of which were answered to the patient's satisfaction. Tonya [name] has  indicated that she wanted to continue with the procedure. Attestation: I, the ordering provider, attest that I have discussed with the patient the benefits, risks, side-effects, alternatives, likelihood of achieving goals, and potential problems during recovery for the procedure that I have provided informed consent. Date  Time: 09/02/2021  9:25 AM  Pre-Procedure Preparation:  Monitoring: As per clinic protocol. Respiration, ETCO2, SpO2, BP, heart rate and rhythm monitor placed and checked for adequate function Safety Precautions: Patient was assessed for positional comfort and pressure points before starting the procedure. Time-out: I initiated and conducted the "Time-out" before starting the procedure, as per protocol. The patient was asked to participate by confirming the accuracy of the "Time Out" information. Verification of the correct person, site, and procedure were performed and confirmed by me, the nursing staff, and the patient. "Time-out" conducted as per Joint Commission's Universal Protocol (UP.01.01.01). Time: 1009  Description of Procedure:          Laterality: Right Levels:   L3, L4, L5,  Medial Branch Level(s) Area Prepped: Posterior Lumbosacral Region DuraPrep (Iodine Povacrylex [0.7% available iodine] and Isopropyl Alcohol, 74% w/w) Safety Precautions: Aspiration looking for blood return was conducted prior to all injections. At no point did we inject any substances, as a needle was being advanced. Before injecting, the patient was told to immediately notify me if she was experiencing any new onset of "ringing in the ears, or metallic taste in the mouth". No attempts were made at seeking any paresthesias. Safe injection practices and needle disposal techniques used. Medications properly checked for expiration dates. SDV (single dose vial) medications used. After the completion of the procedure, all disposable equipment used was discarded in the proper designated medical waste  containers. Local Anesthesia: Protocol guidelines were followed. The patient was positioned over the fluoroscopy table. The area was prepped in the usual manner. The time-out was completed. The target area was identified using fluoroscopy. A 12-in long, straight, sterile hemostat was used with fluoroscopic guidance to locate the targets for each level  blocked. Once located, the skin was marked with an approved surgical skin marker. Once all sites were marked, the skin (epidermis, dermis, and hypodermis), as well as deeper tissues (fat, connective tissue and muscle) were infiltrated with a small amount of a short-acting local anesthetic, loaded on a 10cc syringe with a 25G, 1.5-in  Needle. An appropriate amount of time was allowed for local anesthetics to take effect before proceeding to the next step. Local Anesthetic: Lidocaine 2.0% The unused portion of the local anesthetic was discarded in the proper designated containers.  Technical explanation of process:  L3 Medial Branch Nerve Block (MBB): The target area for the L3 medial branch is at the junction of the postero-lateral aspect of the superior articular process and the superior, posterior, and medial edge of the transverse process of L4. Under fluoroscopic guidance, a Quincke needle was inserted until contact was made with os over the superior postero-lateral aspect of the pedicular shadow (target area). After negative aspiration for blood, 2 mL of the nerve block solution was injected without difficulty or complication. The needle was removed intact. L4 Medial Branch Nerve Block (MBB): The target area for the L4 medial branch is at the junction of the postero-lateral aspect of the superior articular process and the superior, posterior, and medial edge of the transverse process of L5. Under fluoroscopic guidance, a Quincke needle was inserted until contact was made with os over the superior postero-lateral aspect of the pedicular shadow (target area).  After negative aspiration for blood, 2 mL of the nerve block solution was injected without difficulty or complication. The needle was removed intact. L5 Medial Branch Nerve Block (MBB): The target area for the L5 medial branch is at the junction of the postero-lateral aspect of the superior articular process and the superior, posterior, and medial edge of the sacral ala. Under fluoroscopic guidance, a Quincke needle was inserted until contact was made with os over the superior postero-lateral aspect of the pedicular shadow (target area). After negative aspiration for blood, 84mL of the nerve block solution was injected without difficulty or complication. The needle was removed intact.  Nerve block solution: 10 cc solution made of 8 cc of 0.2% ropivacaine, 2 cc of Decadron 10 mg/cc.  3cc injected at each level above on the right.  the unused portion of the solution was discarded in the proper designated containers. Procedural Needles: 22-gauge, 3.5-inch, Quincke needles used for all levels.  Once the entire procedure was completed, the treated area was cleaned, making sure to leave some of the prepping solution back to take advantage of its long term bactericidal properties.   Illustration of the posterior view of the lumbar spine and the posterior neural structures. Laminae of L2 through S1 are labeled. DPRL5, dorsal primary ramus of L5; DPRS1, dorsal primary ramus of S1; DPR3, dorsal primary ramus of L3; FJ, facet (zygapophyseal) joint L3-L4; I, inferior articular process of L4; LB1, lateral branch of dorsal primary ramus of L1; IAB, inferior articular branches from L3 medial branch (supplies L4-L5 facet joint); IBP, intermediate branch plexus; MB3, medial branch of dorsal primary ramus of L3; NR3, third lumbar nerve root; S, superior articular process of L5; SAB, superior articular branches from L4 (supplies L4-5 facet joint also); TP3, transverse process of L3.  Vitals:   09/02/21 1005 09/02/21 1010  09/02/21 1015 09/02/21 1020  BP: (!) 171/85 (!) 164/84 (!) 147/85 (!) 151/82  Pulse: 80 81 83 84  Resp: 18 17 18 16   Temp:  TempSrc:      SpO2: 100% 100% 100% 99%  Weight:      Height:         Start Time: 1009 hrs. End Time: 1018 hrs.  Imaging Guidance (Spinal):          Type of Imaging Technique: Fluoroscopy Guidance (Spinal) Indication(s): Assistance in needle guidance and placement for procedures requiring needle placement in or near specific anatomical locations not easily accessible without such assistance. Exposure Time: Please see nurses notes. Contrast: None used. Fluoroscopic Guidance: I was personally present during the use of fluoroscopy. "Tunnel Vision Technique" used to obtain the best possible view of the target area. Parallax error corrected before commencing the procedure. "Direction-depth-direction" technique used to introduce the needle under continuous pulsed fluoroscopy. Once target was reached, antero-posterior, oblique, and lateral fluoroscopic projection used confirm needle placement in all planes. Images permanently stored in EMR. Interpretation: No contrast injected. I personally interpreted the imaging intraoperatively. Adequate needle placement confirmed in multiple planes. Permanent images saved into the patient's record.  Post-operative Assessment:  Post-procedure Vital Signs:  Pulse/HCG Rate: 84 (NSR)  Temp: (!) 96.8 F (36 C) Resp: 16 BP: (!) 151/82 SpO2: 99 %  EBL: None  Complications: No immediate post-treatment complications observed by team, or reported by patient.  Note: The patient tolerated the entire procedure well. A repeat set of vitals were taken after the procedure and the patient was kept under observation following institutional policy, for this type of procedure. Post-procedural neurological assessment was performed, showing return to baseline, prior to discharge. The patient was provided with post-procedure discharge instructions,  including a section on how to identify potential problems. Should any problems arise concerning this procedure, the patient was given instructions to immediately contact us, at any time, without hesitation. In any case, we plan to contact the patient by telephone for a follow-up status report regarding this interventional procedure.  Comments:  No additional relevant information.  Plan of Care  Orders:  Orders Placed This Encounter  Procedures   DG PA[facility] C-ARM 1-60 MIN NO REPORT    Intraoperative interpretation by procedural physician at [facility].    Standing Status:   Standing    Number of Occurrences:   1    Order Specific Question:   Reason for exam:    Answer:   Assistance in needle guidance and placement for procedures requiring needle placement in or near specific anatomical locations not easily accessible without such assistance.    Medications ordered for procedure: Meds ordered this encounter  Medications   lidocaine (XYLOCAINE) 2 % (with pres) injection 400 mg   diazepam (VALIUM) tablet 5 mg    Make sure Flumazenil is available in the pyxis when using this medication. If oversedation occurs, administer 0.2 mg IV over 15 sec. If after 45 sec no response, administer 0.2 mg again over 1 min; may repeat at 1 min intervals; not to exceed 4 doses (1 mg)   ropivacaine (PF) 2 mg/mL (0.2%) (NAROPIN) injection 9 mL   dexamethasone (DECADRON) injection 10 mg   dexamethasone (DECADRON) injection 10 mg    Medications administered: We administered lidocaine, diazepam, ropivacaine (PF) 2 mg/mL (0.2%), dexamethasone, and dexamethasone.  See the medical record for exact dosing, route, and time of administration.  Follow-up plan:   Return in about 5 weeks (around 10/07/2021) for Post Procedure Evaluation, virtual.      Postherpetic neuralgia: Failed gabapentin, Lyrica, Cymbalta, tramadol.     Recent Visits Date Type Provider Dept  08/08/21 Office Visit Gillis Santa,  MD [facility]  Showing recent visits within past 90 days and meeting all other requirements Today's Visits Date Type Provider Dept  09/02/21 Procedure visit Gillis Santa, MD [facility]  Showing today's visits and meeting all other requirements Future Appointments Date Type Provider Dept  10/07/21 Appointment Gillis Santa, MD [facility]  Showing future appointments within next 90 days and meeting all other requirements Disposition: Discharge home  Discharge (Date  Time): 09/02/2021; 1025 hrs.   Primary Care Physician: [name], NP Location: [facility] Note by: Gillis Santa, MD Date: 09/02/2021; Time: 11:04 AM  Disclaimer:  Medicine is not an exact science. The only guarantee in medicine is that nothing is guaranteed. It is important to note that the decision to proceed with this intervention was based on the information collected from the patient. The Data and conclusions were drawn from the patient's questionnaire, the interview, and the physical examination. Because the information was provided in large part by the patient, it cannot be guaranteed that it has not been purposely or unconsciously manipulated. Every effort has been made to obtain as much relevant data as possible for this evaluation. It is important to note that the conclusions that lead to this procedure are derived in large part from the available data. Always take into account that the treatment will also be dependent on availability of resources and existing treatment guidelines, considered by other Pain Management Practitioners as being common knowledge and practice, at the time of the intervention. For Medico-Legal purposes, it is also important to point out that variation in procedural techniques and pharmacological choices are the acceptable norm. The indications, contraindications, technique, and results of the above procedure should only be  interpreted and judged by a Board-Certified Interventional Pain Specialist with extensive familiarity and expertise in the same exact procedure and technique.

## 2021-09-09 ENCOUNTER — Other Ambulatory Visit: Payer: Self-pay

## 2021-09-09 ENCOUNTER — Ambulatory Visit: Payer: Medicare Other | Attending: Otolaryngology | Admitting: Speech Pathology

## 2021-09-09 DIAGNOSIS — R490 Dysphonia: Secondary | ICD-10-CM | POA: Diagnosis not present

## 2021-09-11 NOTE — Therapy (Addendum)
[place] MAIN [facility] SERVICES [address] [place], [place], [address] Phone: [phone]   Fax:  [phone]  Speech Language Pathology Evaluation  Patient Details  Name: Tonya [name] MRN: [id number] Date of Birth: [birth year] Referring Provider (SLP): Carloyn Manner   Encounter Date: 09/09/2021   End of Session - 09/11/21 1233     Visit Number 1    Number of Visits 17    Date for SLP Re-Evaluation 11/06/21    Authorization Type [employer] Medicare    Authorization Time Period 09/09/2021 thru 11/06/2021    Authorization - Visit Number 1    Progress Note Due on Visit 10    SLP Start Time 1100    SLP Stop Time  1200    SLP Time Calculation (min) 60 min    Activity Tolerance Patient tolerated treatment well             Past Medical History:  Diagnosis Date   (HFpEF) heart failure with preserved ejection fraction ([place])    a. 05/2017 Echo: EF 55-60%, Gr1 DD, mildly dil LA.   CKD (chronic kidney disease), stage III ([place])    Diabetes mellitus without complication ([place])    Hyperlipidemia    Hypertension    Stroke ([place])    a. 08/2017.   Wears dentures    partial upper    Past Surgical History:  Procedure Laterality Date   CATARACT EXTRACTION W/PHACO Left 02/05/2021   Procedure: CATARACT EXTRACTION PHACO AND INTRAOCULAR LENS PLACEMENT (IOC) LEFT DIABETIC 5.47 00:51.4;  Surgeon: Birder Robson, MD;  Location: [place];  Service: Ophthalmology;  Laterality: Left;  Diabetic - insulin and oral meds   CATARACT EXTRACTION W/PHACO Right 03/19/2021   Procedure: CATARACT EXTRACTION PHACO AND INTRAOCULAR LENS PLACEMENT (IOC) RIGHT DIABETIC 6.09 00:39.6;  Surgeon: Birder Robson, MD;  Location: [place];  Service: Ophthalmology;  Laterality: Right;   COLONOSCOPY  12/15/2006   COLONOSCOPY WITH PROPOFOL N/A 06/06/2019   Procedure: COLONOSCOPY WITH PROPOFOL;  Surgeon: [name], MD;  Location: [facility] ENDOSCOPY;   Service: Gastroenterology;  Laterality: N/A;   COLONOSCOPY WITH PROPOFOL N/A 01/16/2020   Procedure: COLONOSCOPY WITH BIOPSY;  Surgeon: [name], MD;  Location: [place];  Service: Endoscopy;  Laterality: N/A;  Diabetic - insulin and oral meds   POLYPECTOMY N/A 01/16/2020   Procedure: POLYPECTOMY;  Surgeon: [name], MD;  Location: [place];  Service: Endoscopy;  Laterality: N/A;   TUBAL LIGATION      There were no vitals filed for this visit.   Subjective Assessment - 09/11/21 1221     Subjective pt pleasant, appears to be good historian    Currently in Pain? No/denies                SLP Evaluation [facility] - 09/11/21 1221       SLP Visit Information   SLP Received On 09/09/21    Referring Provider (SLP) Carloyn Manner    Onset Date 07/31/2021    Medical Diagnosis Dysphonia      General Information   HPI Pt is a 75 year old pt referred by Dr Carloyn Manner for dysphonia. 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 She has associated reflux for which she is taking Omeprazole.    Behavioral/Cognition appropriate    Mobility Status ambulatory with cane      Prior Functional Status  Cognitive/Linguistic Baseline Within functional limits      Cognition   Overall Cognitive Status Within Functional Limits for tasks assessed      Auditory Comprehension   Overall Auditory Comprehension Appears within functional limits for tasks assessed      Expression   Primary Mode of Expression Verbal      Verbal Expression   Overall Verbal Expression Appears within functional limits for tasks assessed      Written Expression   Dominant Hand Right      Oral Motor/Sensory Function   Overall Oral Motor/Sensory Function Appears within functional limits for tasks assessed      Motor Speech   Overall Motor Speech Impaired    Respiration Impaired     Level of Impairment Phrase    Phonation Hoarse;Breathy;Aphonic;Low vocal intensity    Resonance Within functional limits    Articulation Within functional limitis    Intelligibility Intelligible    Motor Planning Witnin functional limits    Motor Speech Errors Not applicable    Phonation Impaired    Vocal Abuses Habitual Cough/Throat Clear;Prolonged Vocal Use;Vocal Fold Dehydration    Tension Present Neck;Shoulder    Volume Soft    Pitch Low      Standardized Assessments   Standardized Assessments  Other Assessment   Perceptual Voice Evaluation           Perceptual Voice Evaluation Voice Case History  Health risks: MODERATE; reflux   Characteristic voice use: pt is fairly talkative; singing in choir  Environmental risks: N/A  Misuse: prolonged use, strain; speaking/singing without good breath support  Phono-traumatic behaviors: throat clearing  Vocal characteristics: gravelly, raspy, strained, periodically aphonic, limited voice range, poor vocal projection Onset of voice problem: developed gradually over the past year with worsening over last several months   Objective Voice Measurements Maximum phonation time for sustained "ah": 5 seconds Average fundamental frequency during sustained "ah": 187 Hz (2 SD below average 244 Hz + 27 for gender) Habitual pitch: 160 Hz Highest dynamic pitch when altering pitch from a low note to a high note: 165 Hz Lowest dynamic pitch when altering from a high note to a low note: 145 Hz Highest dynamic pitch in conversational speech: monotone  Lowest dynamic pitch in conversational speech: monotone  Average time patient was able to sustain /s/: 7.76 seconds Average time patient was able to sustain /z/: 4.4 seconds s/z ratio : 1.8 (suggestive of dysfunction>1.0)  VHI Score:  The Voice Handicap Index is comprised of a series of questions to assess the patient's perception of their voice. It is designed to evaluate the emotional,  physical and functional components of the voice problem.   Functional: 4 (mild) Physical: 20 (mild to moderate) Emotional: 18 (mild to moderate) Total:  z score =  2.2  moderate = 2.00-2.99      SLP Education - 09/11/21 1232     Education Details results of evaluation, ST POC    Person(s) Educated Patient    Methods Explanation;Demonstration    Comprehension Verbalized understanding;Need further instruction              SLP Short Term Goals - 09/11/21 1242       SLP SHORT TERM GOAL #1   Title The patient will decrease laryngeal and articulatory muscle tension by independently completing relaxation/stretching exercises.    Baseline new goal    Time 10   sessions   Status New  SLP Long Term Goals - 09/11/21 1243       SLP LONG TERM GOAL #2   Title The patient will demonstrate independent understanding of vocal hygiene concepts.    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 The patient will benefit from voice therapy for education (vocal hygiene, reflux precautions), to improve breath control for speech, and learn techniques to increase loudness and pitch range without strain.    Speech Therapy Frequency 2x / week    Duration 8 weeks    Treatment/Interventions SLP instruction and feedback;Patient/family education    Potential to Achieve Goals Fair    Potential Considerations Severity of impairments    Consulted and Agree with Plan of Care Patient             Patient will benefit from skilled therapeutic intervention in order to  improve the following deficits and impairments:   Dysphonia    Problem List Patient Active Problem List   Diagnosis Date Noted   Neuropathic pain 06/07/2020   Lumbar degenerative disc disease 06/07/2020   Lumbar spondylosis 06/07/2020   Chronic pain syndrome 04/18/2020   Sacroiliac joint pain 04/18/2020   Chronic heart failure with preserved ejection fraction (HFpEF) ([place]) 01/11/2020   Grade I diastolic dysfunction 16/09/9603   History of CVA (cerebrovascular accident) 12/01/2019   Hyperparathyroidism due to renal insufficiency ([place]) 08/31/2019   Anemia in CKD (chronic kidney disease) 02/22/2019   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 Rutherford Nail M.S., CCC-SLP, [place] Pathologist Rehabilitation Services Office [phone]  Stormy Fabian 09/11/2021, 12:48 PM  [place] MAIN [facility] SERVICES [address] [place], [place], [address] Phone: [phone]   Fax:  [phone]  Name: Tonya [name] MRN: [id number] Date of Birth: March 31, 1946

## 2021-09-12 ENCOUNTER — Ambulatory Visit: Payer: Medicare Other | Admitting: Speech Pathology

## 2021-09-12 ENCOUNTER — Other Ambulatory Visit: Payer: Self-pay

## 2021-09-12 DIAGNOSIS — R490 Dysphonia: Secondary | ICD-10-CM | POA: Diagnosis not present

## 2021-09-13 NOTE — Patient Instructions (Addendum)
Abdominal breathing Abdominal breathing with sustained /s/ Flow phonation skill level 1

## 2021-09-13 NOTE — Therapy (Signed)
[place] MAIN [facility] SERVICES [address] [place], [place], [address] Phone: [phone]   Fax:  [phone]  Speech Language Pathology Treatment  Patient Details  Name: Tonya [name] MRN: [id number] Date of Birth: 1946-08-27 Referring Provider (SLP): Carloyn Manner   Encounter Date: 09/12/2021   End of Session - 09/13/21 1521     Visit Number 2    Number of Visits 17    Date for SLP Re-Evaluation 11/06/21    Authorization Type [employer] Medicare    Authorization Time Period 09/09/2021 thru 11/06/2021    Authorization - Visit Number 2    Progress Note Due on Visit 10    SLP Start Time 1400    SLP Stop Time  1500    SLP Time Calculation (min) 60 min    Activity Tolerance Patient tolerated treatment well             Past Medical History:  Diagnosis Date   (HFpEF) heart failure with preserved ejection fraction ([place])    a. 05/2017 Echo: EF 55-60%, Gr1 DD, mildly dil LA.   CKD (chronic kidney disease), stage III ([place])    Diabetes mellitus without complication ([place])    Hyperlipidemia    Hypertension    Stroke ([place])    a. 08/2017.   Wears dentures    partial upper    Past Surgical History:  Procedure Laterality Date   CATARACT EXTRACTION W/PHACO Left 02/05/2021   Procedure: CATARACT EXTRACTION PHACO AND INTRAOCULAR LENS PLACEMENT (IOC) LEFT DIABETIC 5.47 00:51.4;  Surgeon: Birder Robson, MD;  Location: [place];  Service: Ophthalmology;  Laterality: Left;  Diabetic - insulin and oral meds   CATARACT EXTRACTION W/PHACO Right 03/19/2021   Procedure: CATARACT EXTRACTION PHACO AND INTRAOCULAR LENS PLACEMENT (IOC) RIGHT DIABETIC 6.09 00:39.6;  Surgeon: Birder Robson, MD;  Location: [place];  Service: Ophthalmology;  Laterality: Right;   COLONOSCOPY  12/15/2006   COLONOSCOPY WITH PROPOFOL N/A 06/06/2019   Procedure: COLONOSCOPY WITH PROPOFOL;  Surgeon: [name], MD;  Location: [facility] ENDOSCOPY;   Service: Gastroenterology;  Laterality: N/A;   COLONOSCOPY WITH PROPOFOL N/A 01/16/2020   Procedure: COLONOSCOPY WITH BIOPSY;  Surgeon: [name], MD;  Location: [place];  Service: Endoscopy;  Laterality: N/A;  Diabetic - insulin and oral meds   POLYPECTOMY N/A 01/16/2020   Procedure: POLYPECTOMY;  Surgeon: [name], MD;  Location: [place];  Service: Endoscopy;  Laterality: N/A;   TUBAL LIGATION      There were no vitals filed for this visit.   Subjective Assessment - 09/13/21 1518     Subjective pt pleasant, eager to participate in therapy activities    Currently in Pain? No/denies                   ADULT SLP TREATMENT - 09/13/21 0001       Treatment Provided   Treatment provided Cognitive-Linquistic      Cognitive-Linquistic Treatment   Treatment focused on Voice;Patient/family/caregiver education    Skilled Treatment Introduced abdominal breathing, producing sustained /s/ using abdominal breathing, pt able to increase duration of /s/ from 6 seconds at eval to 12 seconds with cues during structred exercise; Patient instructed in flow phonation through skill level 1: establish airflow release: Unarticulated: significant improvement in duration of airflow.              SLP Education - 09/13/21 1521     Education Details abdominal breathing, flow phonation  Person(s) Educated Patient    Methods Explanation;Demonstration;Tactile cues;Verbal cues;Handout    Comprehension Verbalized understanding;Returned demonstration;Need further instruction              SLP Short Term Goals - 09/11/21 1242       SLP SHORT TERM GOAL #1   Title The patient will decrease laryngeal and articulatory muscle tension by independently completing relaxation/stretching exercises.    Baseline new goal    Time 10   sessions   Status New              SLP Long Term Goals - 09/11/21 1243       SLP LONG TERM GOAL #2   Title The patient will  demonstrate independent understanding of vocal hygiene concepts.    Baseline new goal    Time 8    Period Weeks    Status New    Target Date 11/06/21              Plan - 09/13/21 1523     Clinical Impression Statement The pt responded well to abdominal breathing exercises as well as flow phonation therapy. She has already improved her duration of unarticulated airflow and is experiencing the benefits of reducing tension as she stated, "I am not getting a headache and my throat/neck feels relaxed." Skilled ST intervention continues to be required to target vocal hygiene, relaxed phonation and decreased respiratory support.    Speech Therapy Frequency 2x / week    Duration 8 weeks    Treatment/Interventions SLP instruction and feedback;Patient/family education    Potential to Achieve Goals Fair    Potential Considerations Severity of impairments    SLP Home Exercise Plan provided, see pt instructions section    Consulted and Agree with Plan of Care Patient             Patient will benefit from skilled therapeutic intervention in order to improve the following deficits and impairments:   Dysphonia    Problem List Patient Active Problem List   Diagnosis Date Noted   Neuropathic pain 06/07/2020   Lumbar degenerative disc disease 06/07/2020   Lumbar spondylosis 06/07/2020   Chronic pain syndrome 04/18/2020   Sacroiliac joint pain 04/18/2020   Chronic heart failure with preserved ejection fraction (HFpEF) ([place]) 01/11/2020   Grade I diastolic dysfunction 02/72/5366   History of CVA (cerebrovascular accident) 12/01/2019   Hyperparathyroidism due to renal insufficiency ([place]) 08/31/2019   Anemia in CKD (chronic kidney disease) 02/22/2019   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 Rutherford Nail M.S., CCC-SLP, [place] Pathologist Rehabilitation Services Office [phone]  Stormy Fabian 09/13/2021, 3:31 PM  [place] MAIN [facility] SERVICES [address] [place], [place], [address] Phone: [phone]   Fax:  [phone]   Name: Tonya [name] MRN: [id number] Date of Birth: [birth year]

## 2021-09-17 ENCOUNTER — Ambulatory Visit: Payer: Medicare Other | Attending: Otolaryngology | Admitting: Speech Pathology

## 2021-09-17 DIAGNOSIS — R490 Dysphonia: Secondary | ICD-10-CM | POA: Insufficient documentation

## 2021-09-19 ENCOUNTER — Ambulatory Visit: Payer: Medicare Other | Admitting: Speech Pathology

## 2021-09-19 ENCOUNTER — Other Ambulatory Visit: Payer: Self-pay

## 2021-09-19 DIAGNOSIS — R490 Dysphonia: Secondary | ICD-10-CM | POA: Diagnosis not present

## 2021-09-19 NOTE — Patient Instructions (Signed)
Practice abdominal breathing Practice flow phonation through skill level 1: establish airflow release: Unarticulated - single syllable thru phrase level

## 2021-09-20 NOTE — Therapy (Signed)
[place] MAIN [facility] SERVICES [address] [place], [place], [address] Phone: [phone]   Fax:  [phone]  Speech Language Pathology Treatment  Patient Details  Name: Tonya [name] MRN: [id number] Date of Birth: [birth year] Referring Provider (SLP): Carloyn Manner   Encounter Date: 09/19/2021   End of Session - 09/19/21 1056     Visit Number 3    Number of Visits 17    Date for SLP Re-Evaluation 11/06/21    Authorization Type [employer] Medicare    Authorization Time Period 09/09/2021 thru 11/06/2021    Authorization - Visit Number 3    Progress Note Due on Visit 10    SLP Start Time 0900    SLP Stop Time  1000    SLP Time Calculation (min) 60 min    Activity Tolerance Patient tolerated treatment well             Past Medical History:  Diagnosis Date   (HFpEF) heart failure with preserved ejection fraction ([place])    a. 05/2017 Echo: EF 55-60%, Gr1 DD, mildly dil LA.   CKD (chronic kidney disease), stage III ([place])    Diabetes mellitus without complication ([place])    Hyperlipidemia    Hypertension    Stroke ([place])    a. 08/2017.   Wears dentures    partial upper    Past Surgical History:  Procedure Laterality Date   CATARACT EXTRACTION W/PHACO Left 02/05/2021   Procedure: CATARACT EXTRACTION PHACO AND INTRAOCULAR LENS PLACEMENT (IOC) LEFT DIABETIC 5.47 00:51.4;  Surgeon: Birder Robson, MD;  Location: [place];  Service: Ophthalmology;  Laterality: Left;  Diabetic - insulin and oral meds   CATARACT EXTRACTION W/PHACO Right 03/19/2021   Procedure: CATARACT EXTRACTION PHACO AND INTRAOCULAR LENS PLACEMENT (IOC) RIGHT DIABETIC 6.09 00:39.6;  Surgeon: Birder Robson, MD;  Location: [place];  Service: Ophthalmology;  Laterality: Right;   COLONOSCOPY  12/15/2006   COLONOSCOPY WITH PROPOFOL N/A 06/06/2019   Procedure: COLONOSCOPY WITH PROPOFOL;  Surgeon: [name], MD;  Location: [facility] ENDOSCOPY;   Service: Gastroenterology;  Laterality: N/A;   COLONOSCOPY WITH PROPOFOL N/A 01/16/2020   Procedure: COLONOSCOPY WITH BIOPSY;  Surgeon: [name], MD;  Location: [place];  Service: Endoscopy;  Laterality: N/A;  Diabetic - insulin and oral meds   POLYPECTOMY N/A 01/16/2020   Procedure: POLYPECTOMY;  Surgeon: [name], MD;  Location: [place];  Service: Endoscopy;  Laterality: N/A;   TUBAL LIGATION      There were no vitals filed for this visit.   Subjective Assessment - 09/19/21 1053     Subjective "did I miss my appt earlier this week?"    Currently in Pain? No/denies                   ADULT SLP TREATMENT - 09/20/21 0001       Treatment Provided   Treatment provided Cognitive-Linquistic      Cognitive-Linquistic Treatment   Treatment focused on Voice;Patient/family/caregiver education    Skilled Treatment Pt is Mod I with abdominal breathing; Patient instructed in flow phonation through skill level 1: establish airflow release: Unarticulated: significant improvement in duration of airflow thru sentence level with model by SLP              SLP Education - 09/19/21 1056     Education Details flow phonation; muscle tension dysphonia    Person(s) Educated Patient    Methods Explanation;Demonstration;Tactile cues;Verbal cues;Handout  Comprehension Verbalized understanding;Returned demonstration;Need further instruction              SLP Short Term Goals - 09/19/21 1100       SLP SHORT TERM GOAL #1   Title The patient will decrease laryngeal and articulatory muscle tension by independently completing relaxation/stretching exercises.    Time 10    Period --   sessions   Status New      SLP SHORT TERM GOAL #2   Title The patient will eliminate phonotraumatic behaviors such as chronic throat clearing, by substituting non-traumatic methods to clear mucus.    Baseline habitual throat clears    Time 10    Period --   sessions   Status  New      SLP SHORT TERM GOAL #3   Title The patient will increase hydration for an eventual goal of 6-8 glasses per day and limit caffeine intake (to maximum of 1-2, 8 oz cups/day), as measured by patient report.    Baseline 8 oz    Time 10    Period --   sessions   Status New              SLP Long Term Goals - 09/20/21 0801       SLP LONG TERM GOAL #1   Title The patient will increase hydration for an eventual goal of 6-8 glasses per day and limit caffeine intake (to maximum of 1-2, 8 oz cups/day), as measured by patient report.    Baseline new goal    Time 8    Period Weeks    Status New    Target Date 11/06/21      SLP LONG TERM GOAL #2   Title The patient will demonstrate independent understanding of vocal hygiene concepts.    Baseline new goal    Time 8    Period Weeks    Target Date 11/06/22      SLP LONG TERM GOAL #3   Title The patient will eliminate phonotraumatic behaviors such as chronic throat clearing, by substituting non-traumatic methods to clear mucus.    Baseline new goal    Time 8    Period Weeks    Status New    Target Date 11/06/21      SLP LONG TERM GOAL #4   Title The patient will maximize voice quality and loudness using breath support/oral resonance for sustained vowel production, pitch glides, and hierarchal speech drill.    Baseline new goal    Time 8    Period Weeks    Status New    Target Date 11/06/22              Plan - 09/19/21 1057     Clinical Impression Statement The pt responded well to abdominal breathing exercises as well as flow phonation therapy. When advancing flow phonation from single syllable to phrase level, pt required SLP model to eliminate strain. During moments of strained airflow, pt also had throat clear. Education provided on strain and phontraumatic nature of throat clears. Skilled ST intervention continues to be required to target vocal hygiene, relaxed phonation and decreased respiratory support.    Speech  Therapy Frequency 2x / week    Duration 12 weeks    Treatment/Interventions SLP instruction and feedback;Patient/family education    Potential to Achieve Goals Fair    Potential Considerations Severity of impairments    SLP Home Exercise Plan provided, see pt instructions section    Consulted and Agree  with Plan of Care Patient             Patient will benefit from skilled therapeutic intervention in order to improve the following deficits and impairments:   Dysphonia    Problem List Patient Active Problem List   Diagnosis Date Noted   Neuropathic pain 06/07/2020   Lumbar degenerative disc disease 06/07/2020   Lumbar spondylosis 06/07/2020   Chronic pain syndrome 04/18/2020   Sacroiliac joint pain 04/18/2020   Chronic heart failure with preserved ejection fraction (HFpEF) ([place]) 01/11/2020   Grade I diastolic dysfunction 12/52/7129   History of CVA (cerebrovascular accident) 12/01/2019   Hyperparathyroidism due to renal insufficiency ([place]) 08/31/2019   Anemia in CKD (chronic kidney disease) 02/22/2019   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 Rutherford Nail M.S., CCC-SLP, [place] Pathologist Rehabilitation Services Office [phone]  Stormy Fabian 09/20/2021, 8:06 AM  [place] MAIN [facility] SERVICES [address] [place], [place], [address] Phone: [phone]   Fax:  [phone]   Name: Tonya [name] MRN: [id number] Date of Birth: September 07, 1946

## 2021-09-24 ENCOUNTER — Ambulatory Visit: Payer: Medicare Other | Admitting: Speech Pathology

## 2021-09-24 ENCOUNTER — Other Ambulatory Visit: Payer: Self-pay

## 2021-09-24 ENCOUNTER — Ambulatory Visit (INDEPENDENT_AMBULATORY_CARE_PROVIDER_SITE_OTHER): Payer: Medicare Other

## 2021-09-24 ENCOUNTER — Telehealth: Payer: Self-pay

## 2021-09-24 DIAGNOSIS — I5032 Chronic diastolic (congestive) heart failure: Secondary | ICD-10-CM

## 2021-09-24 DIAGNOSIS — I10 Essential (primary) hypertension: Secondary | ICD-10-CM

## 2021-09-24 DIAGNOSIS — G894 Chronic pain syndrome: Secondary | ICD-10-CM

## 2021-09-24 DIAGNOSIS — G8929 Other chronic pain: Secondary | ICD-10-CM

## 2021-09-24 DIAGNOSIS — E1140 Type 2 diabetes mellitus with diabetic neuropathy, unspecified: Secondary | ICD-10-CM

## 2021-09-24 DIAGNOSIS — M5442 Lumbago with sciatica, left side: Secondary | ICD-10-CM

## 2021-09-24 DIAGNOSIS — R490 Dysphonia: Secondary | ICD-10-CM | POA: Diagnosis not present

## 2021-09-24 DIAGNOSIS — E1165 Type 2 diabetes mellitus with hyperglycemia: Secondary | ICD-10-CM

## 2021-09-24 NOTE — Patient Instructions (Signed)
Visit Information  PATIENT GOALS:  Goals Addressed             This Visit's Progress    RNCM: Manage Chronic Pain       Timeframe:  Long-Range Goal Priority:  High Start Date:   02-15-2021                          Expected End Date:         07-13-2022              Follow Up Date 11-26-2021   - call for medicine refill 2 or 3 days before it runs out - develop a personal pain management plan - keep track of prescription refills - plan exercise or activity when pain is best controlled - prioritize tasks for the day - track times pain is worst and when it is best - track what makes the pain worse and what makes it better - use ice or heat for pain relief - work slower and less intense when having pain    Why is this important?   Day-to-day life can be hard when you have chronic pain.  Pain medicine is just one piece of the treatment puzzle.  You can try these action steps to help you manage your pain.    Notes: Pain onset x 2 days ago, denies falls or injury. 04-05-2021: Had a procedure recently and is doing well. Having tingling down her legs but not bad. Will discuss with the specialist at appointment on 04-09-2021. 07-23-2021: Pain down her legs has resolved but the patient is still dealing with a lot of back pain. Will discuss with the pcp on upcoming appointment. 10-11-2022Martin Majestic to the pain specialist on August 25th and got an injection. The patient states it helped for a few days but it wears off quickly. The patient states the pain specialist is going to call to evaluate her pain level on 10-24 and she will talk to them about the next steps. The patient states that when she is sitting and it is call it is about a 1 but she says it is not always like that. She has to take Tylenol Arthritis to rest at night. Sometimes it wakes her up from her sleep if she turns over. She is hopeful the pain specialist will have some alternative methods to try to help with the pain relief in her back.       RNCM: Monitor and Manage My Blood Sugar-Diabetes Type 2       Timeframe:  Long-Range Goal Priority:  High Start Date:  02-15-2021                           Expected End Date:  06-07-2022                    Follow Up Date 11-26-2021   - check blood sugar at prescribed times - check blood sugar before and after exercise - check blood sugar if I feel it is too high or too low - enter blood sugar readings and medication or insulin into daily log - take the blood sugar log to all doctor visits - take the blood sugar meter to all doctor visits    Why is this important?   Checking your blood sugar at home helps to keep it from getting very high or very low.  Writing the results in  a diary or log helps the doctor know how to care for you.  Your blood sugar log should have the time, date and the results.  Also, write down the amount of insulin or other medicine that you take.  Other information, like what you ate, exercise done and how you were feeling, will also be helpful.   Lab Results  Component Value Date   HGBA1C 8.2 (H) 01/29/2021   08-30-2021 at endocrinologist office her hemoglobin A1C was 9.1  Notes: Had not been consistently checking blood sugars but has started back now. Is checking blood sugars consistently now. The patient states the readings are in the 110's. 07-23-2021: The patient has been consistently checking. Praised for positive changes. The patient states that she has been having some low episodes sometimes at night but mostly in the day after taking her "shot". She reports that it has been 63 or a little lower but states the highest it has been is 140. She states this am it was 113.  Last hemoglobin A1C was 8.1 on 05-08-2021. Will collaborate with the pcp concerning lows. Has a follow up appointment on 07-29-2021 with the pcp. 09-24-2021: The patient is using the free style libre and her average blood sugars when going to endocrinologist was 163. She is using sliding scale meal  coverage and Sunday her blood sugar dropped too low in church. She did not eat enough to compensate. She did not check her blood sugar. Advised the patient the next time that happened to check her blood sugar to see how low it was. Also discussed trying jelly beans as nother source of getting her blood sugars up when it drops. She had candy but it was not enough. The patient education on fasting blood sugars of <130 and post prandial of <180. Review goal of A1C of <7.0. Encouraged the patient to follow a heart healthy/ADA diet.        Patient verbalizes understanding of instructions provided today and agrees to view in [place].   Telephone follow up appointment with care management team member scheduled for: 11-26-2021 at 145 pm  [name] RN, MSN, [facility] Mobile: [phone]

## 2021-09-24 NOTE — Therapy (Signed)
[place] MAIN [facility] SERVICES [address] [place], [place], [address] Phone: [phone]   Fax:  [phone]  Speech Language Pathology Treatment  Patient Details  Name: Tonya [name] MRN: [id number] Date of Birth: 06-25-1946 Referring Provider (SLP): Carloyn Manner   Encounter Date: 09/24/2021   End of Session - 09/24/21 1247     Visit Number 4    Number of Visits 17    Date for SLP Re-Evaluation 11/06/21    Authorization Type [employer]    Authorization Time Period 09/09/2021 thru 11/06/2021    Authorization - Visit Number 4    Progress Note Due on Visit 10    SLP Start Time 0900    SLP Stop Time  1000    SLP Time Calculation (min) 60 min    Activity Tolerance Patient tolerated treatment well             Past Medical History:  Diagnosis Date   (HFpEF) heart failure with preserved ejection fraction ([place])    a. 05/2017 Echo: EF 55-60%, Gr1 DD, mildly dil LA.   CKD (chronic kidney disease), stage III ([place])    Diabetes mellitus without complication ([place])    Hyperlipidemia    Hypertension    Stroke ([place])    a. 08/2017.   Wears dentures    partial upper    Past Surgical History:  Procedure Laterality Date   CATARACT EXTRACTION W/PHACO Left 02/05/2021   Procedure: CATARACT EXTRACTION PHACO AND INTRAOCULAR LENS PLACEMENT (IOC) LEFT DIABETIC 5.47 00:51.4;  Surgeon: Birder Robson, MD;  Location: [place];  Service: Ophthalmology;  Laterality: Left;  Diabetic - insulin and oral meds   CATARACT EXTRACTION W/PHACO Right 03/19/2021   Procedure: CATARACT EXTRACTION PHACO AND INTRAOCULAR LENS PLACEMENT (IOC) RIGHT DIABETIC 6.09 00:39.6;  Surgeon: Birder Robson, MD;  Location: [place];  Service: Ophthalmology;  Laterality: Right;   COLONOSCOPY  12/15/2006   COLONOSCOPY WITH PROPOFOL N/A 06/06/2019   Procedure: COLONOSCOPY WITH PROPOFOL;  Surgeon: [name], MD;  Location: [facility] ENDOSCOPY;   Service: Gastroenterology;  Laterality: N/A;   COLONOSCOPY WITH PROPOFOL N/A 01/16/2020   Procedure: COLONOSCOPY WITH BIOPSY;  Surgeon: [name], MD;  Location: [place];  Service: Endoscopy;  Laterality: N/A;  Diabetic - insulin and oral meds   POLYPECTOMY N/A 01/16/2020   Procedure: POLYPECTOMY;  Surgeon: [name], MD;  Location: [place];  Service: Endoscopy;  Laterality: N/A;   TUBAL LIGATION      There were no vitals filed for this visit.   Subjective Assessment - 09/24/21 1240     Subjective Patient pleasant, brought her homework folder and reported practicing at home    Currently in Pain? No/denies                   ADULT SLP TREATMENT - 09/24/21 0001       Treatment Provided   Treatment provided Cognitive-Linquistic      Cognitive-Linquistic Treatment   Treatment focused on Voice;Patient/family/caregiver education    Skilled Treatment Pt arrived to session and began demonstrating flow phonation unvoiced articulated with significant strain heard. Pt required maximal multi-modal (including imitation) to establish unrestricted airflow at the sound and word level. Pt continues to demonstrate self-awareness of vocal strain but frequently requires a model before she is able to establish unrestricted airflow.              SLP Education - 09/24/21 1247     Education  Details flow phonation; muscle tension dysphonia    Person(s) Educated Patient    Methods Explanation    Comprehension Verbalized understanding;Need further instruction              SLP Short Term Goals - 09/19/21 1100       SLP SHORT TERM GOAL #1   Title The patient will decrease laryngeal and articulatory muscle tension by independently completing relaxation/stretching exercises.    Time 10    Period --   sessions   Status New      SLP SHORT TERM GOAL #2   Title The patient will eliminate phonotraumatic behaviors such as chronic throat clearing, by substituting  non-traumatic methods to clear mucus.    Baseline habitual throat clears    Time 10    Period --   sessions   Status New      SLP SHORT TERM GOAL #3   Title The patient will increase hydration for an eventual goal of 6-8 glasses per day and limit caffeine intake (to maximum of 1-2, 8 oz cups/day), as measured by patient report.    Baseline 8 oz    Time 10    Period --   sessions   Status New              SLP Long Term Goals - 09/20/21 0801       SLP LONG TERM GOAL #1   Title The patient will increase hydration for an eventual goal of 6-8 glasses per day and limit caffeine intake (to maximum of 1-2, 8 oz cups/day), as measured by patient report.    Baseline new goal    Time 8    Period Weeks    Status New    Target Date 11/06/21      SLP LONG TERM GOAL #2   Title The patient will demonstrate independent understanding of vocal hygiene concepts.    Baseline new goal    Time 8    Period Weeks    Target Date 11/06/22      SLP LONG TERM GOAL #3   Title The patient will eliminate phonotraumatic behaviors such as chronic throat clearing, by substituting non-traumatic methods to clear mucus.    Baseline new goal    Time 8    Period Weeks    Status New    Target Date 11/06/21      SLP LONG TERM GOAL #4   Title The patient will maximize voice quality and loudness using breath support/oral resonance for sustained vowel production, pitch glides, and hierarchal speech drill.    Baseline new goal    Time 8    Period Weeks    Status New    Target Date 11/06/22              Plan - 09/24/21 1248     Clinical Impression Statement The pt responded well to flow phonation therapy. When advancing flow phonation from single syllable to phrase level, pt required SLP model to eliminate strain. During moments of strained airflow, pt also had throat clear. Education provided on strain and phontraumatic nature of throat clears. Skilled ST intervention continues to be required to  target vocal hygiene, relaxed phonation and decreased respiratory support.    Speech Therapy Frequency 2x / week    Duration 12 weeks    Treatment/Interventions SLP instruction and feedback;Patient/family education    Potential to Achieve Goals Fair    Potential Considerations Severity of impairments    SLP Home  Exercise Plan provided, see pt instructions section    Consulted and Agree with Plan of Care Patient             Patient will benefit from skilled therapeutic intervention in order to improve the following deficits and impairments:   Dysphonia    Problem List Patient Active Problem List   Diagnosis Date Noted   Neuropathic pain 06/07/2020   Lumbar degenerative disc disease 06/07/2020   Lumbar spondylosis 06/07/2020   Chronic pain syndrome 04/18/2020   Sacroiliac joint pain 04/18/2020   Chronic heart failure with preserved ejection fraction (HFpEF) (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 Rutherford Nail M.S., CCC-SLP, [place] [facility]ologist Rehabilitation Services Office [phone]  Stormy Fabian 09/24/2021, 12:50 PM  [place] MAIN [facility] SERVICES [address] [place], [place], [address] Phone: [phone]   Fax:   [phone]   Name: Tonya [name] MRN: [id number] Date of Birth: 1946-07-18

## 2021-09-24 NOTE — Chronic Care Management (AMB) (Signed)
Chronic Care Management   CCM RN Visit Note  09/24/2021 Name: Tonya [name] MRN: [id number] DOB: November 12, 1946  Subjective: Tonya [name] is a 75 y.o. year old female who is a primary care patient of Cannady, [name], NP. The care management team was consulted for assistance with disease management and care coordination needs.    Engaged with patient by telephone for follow up visit in response to provider referral for case management and/or care coordination services.   Consent to Services:  The patient was given information about Chronic Care Management services, agreed to services, and gave verbal consent prior to initiation of services.  Please see initial visit note for detailed documentation.   Patient agreed to services and verbal consent obtained.   Assessment: Review of patient past medical history, allergies, medications, health status, including review of consultants reports, laboratory and other test data, was performed as part of comprehensive evaluation and provision of chronic care management services.   SDOH (Social Determinants of Health) assessments and interventions performed:  SDOH Interventions    Flowsheet Row Most Recent Value  SDOH Interventions   Food Insecurity Interventions Intervention Not Indicated  Financial Strain Interventions Intervention Not Indicated  Housing Interventions Intervention Not Indicated  Intimate Partner Violence Interventions Intervention Not Indicated  Physical Activity Interventions Other (Comments)  [no structured activity]  Stress Interventions Other (Comment)  [a little stressed over her pain level in her back and not being relieved]  Social Connections Interventions Intervention Not Indicated  Transportation Interventions Intervention Not Indicated        CCM Care Plan  Allergies  Allergen Reactions   Atorvastatin     Myalgias    Gabapentin Other (See Comments)    Speech impairment    Pravastatin     Myalgias     Pregabalin     Speech impairment   Trulicity [Dulaglutide] Hives    Outpatient Encounter Medications as of 09/24/2021  Medication Sig   aspirin 81 MG tablet Take 81 mg by mouth daily.   carvedilol (COREG) 3.125 MG tablet TAKE 1 TABLET BY MOUTH  TWICE DAILY WITH A MEAL   clopidogrel (PLAVIX) 75 MG tablet TAKE 1 TABLET BY MOUTH  DAILY   docusate sodium (COLACE) 100 MG capsule Take 100 mg by mouth daily as needed for mild constipation.   ferrous sulfate 325 (65 FE) MG tablet Take 325 mg by mouth daily with breakfast.   HUMALOG KWIKPEN 100 UNIT/ML KiwkPen 6 Units. Take 6 units before breakfast, lunch, supper if glucose greater than 70   Insulin Glargine (BASAGLAR KWIKPEN [place]) Inject 8 Units into the skin daily.   NOVOFINE 32G X 6 MM MISC USE AS DIRECTED THREE TIMES DAILY WITH HUMALOG   olmesartan (BENICAR) 40 MG tablet Take 1 tablet (40 mg total) by mouth daily.   omeprazole (PRILOSEC) 20 MG capsule TAKE 1 CAPSULE BY MOUTH  TWICE DAILY   pioglitazone (ACTOS) 30 MG tablet Take 30 mg by mouth daily.   rosuvastatin (CRESTOR) 5 MG tablet TAKE 1 TABLET BY MOUTH  DAILY   Torsemide 40 MG TABS Take by mouth.   Vitamin D, Cholecalciferol, 50 MCG (2000 UT) CAPS Take 2,000 Units by mouth daily.   No facility-administered encounter medications on file as of 09/24/2021.    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[place]) 09/22/2017   Advanced care planning/counseling  discussion 04/13/2017   Post herpetic neuralgia 10/10/2016   Hip bursitis 09/18/2015   Poorly controlled type 2 diabetes mellitus with neuropathy ([place]) 05/01/2015   Hyperlipidemia associated with type 2 diabetes mellitus ([place]) 05/01/2015   Chronic kidney disease, stage 4, severely decreased GFR ([place]) 05/01/2015   Encounter for long-term (current) use of insulin ([place]) 05/01/2015    Conditions to be addressed/monitored:CHF, HTN, DMII, and chronic pain  Care Plan : RNCM: Heart Failure (Adult)  Updates made by Vanita Ingles, RN since 09/24/2021 12:00 AM     Problem: RNCM: Symptom Exacerbation (Heart Failure)   Priority: Medium     Long-Range Goal: RNCM: Symptom Exacerbation Prevented or Minimized   Start Date: 02/15/2021  Expected End Date: 06/03/2022  This Visit's Progress: On track  Recent Progress: On track  Priority: Medium  Note:   Current Barriers:  Knowledge deficits related to basic heart failure pathophysiology and self care management Lacks social connections Does not contact provider office for questions/concerns Lack of scale in home Financial strain Nurse Case Manager Clinical Goal(s):  patient will weigh self daily and record patient will verbalize understanding of Heart Failure Action Plan and when to call doctor patient will take all Heart Failure mediations as prescribed Interventions:  Collaboration with [name], NP regarding development and update of comprehensive plan of care as evidenced by provider attestation and co-signature Inter-disciplinary care team collaboration (see longitudinal plan of care) Basic overview and discussion of pathophysiology of Heart Failure Provided written and verbal education on low sodium diet. 07-23-2021: Endorses compliance with heart healthy/ADA diet  Reviewed Heart Failure Action Plan in depth and provided written copy Assessed for scales in home- has scales Discussed importance of daily weight Reviewed role of  diuretics in prevention of fluid overload Evaluation of HF. 09-24-2021: The patient states she is doing well. Denies any exacerbations of HF at this time. Is compliant with the poc, medications and dietary restrictions. Will continue to monitor.  Patient Goals/Self-Care Activities:  Takes Heart Failure Medications as prescribed. 09-24-2021: Compliant with medications  Weighs daily and record (notifying MD of 3 lb weight gain over night or 5 lb in a week) Verbalizes understanding of and follows CHF Action Plan Adheres to low sodium diet  - Take Heart Failure Medications as prescribed - Weigh daily and record (notify MD with 3 lb weight gain over night or 5 lb in a week) - Follow CHF Action Plan - Adhere to low sodium diet - barriers to lifestyle changes reviewed and addressed - barriers to treatment reviewed and addressed - cognitive screening completed and reviewed - depression screen reviewed - health literacy screening completed or reviewed - healthy lifestyle promoted - medication-adherence assessment completed - rescue (action) plan developed - rescue (action) plan reviewed - self-awareness of signs/symptoms of worsening disease encouraged Follow Up Plan: Telephone follow up appointment with care management team member scheduled for: 11-26-2021 at 145pm    Care Plan : RNCM: Chronic Pain (Adult)  Updates made by Vanita Ingles, RN since 09/24/2021 12:00 AM     Problem: RNCM: Pain Management Plan (Chronic Pain)   Priority: High  Onset  Date: 02/12/2021     Long-Range Goal: RNCM: Pain Management Plan Developed   Start Date: 02/12/2021  Expected End Date: 07/03/2022  This Visit's Progress: On track  Recent Progress: On track  Priority: High  Note:   Current Barriers:  Knowledge Deficits related to managing acute/chronic pain Non-adherence to scheduled provider appointments Non-adherence to prescribed medication regimen Difficulty obtaining medications Chronic Disease Management  support and education needs related to chronic pain Unable to independently manage pain and discomfort to lower back radiating down left leg Lacks social connections Unable to perform IADLs independently Does not contact provider office for questions/concerns Nurse Case Manager Clinical Goal(s):  patient will verbalize understanding of plan for managing pain patient will attend all scheduled medical appointments: 10-29-2021 at 1020 am patient will demonstrate use of different relaxation  skills and/or diversional activities to assist with pain reduction (distraction, imagery, relaxation, massage, acupressure, TENS, heat, and cold application patient will report pain at a level less than 3 to 4 on a 10-10 rating scale patient will use pharmacological and nonpharmacological pain relief strategies patient will verbalize acceptable level of pain relief and ability to engage in desired activities patient will engage in desired activities without an increase in pain level Interventions:  Collaboration with [name] T, NP regarding development and update of comprehensive plan of care as evidenced by provider attestation and co-signature Inter-disciplinary care team collaboration (see longitudinal plan of care) - careful application of heat or ice encouraged - deep breathing, relaxation and mindfulness use promoted - effectiveness of pharmacologic therapy monitored - medication-induced side effects managed - misuse of pain medication assessed - motivation and barriers to change assessed and addressed - mutually acceptable comfort goal set - pain assessed. 04-05-2021: The patient states her pain level is less than a 3. Procedure has worked well for her. She is having tingling in her legs but will discuss with the specialist at upcoming appointment on 04-09-2021. 07-23-2021: The patient denies any tingling in her legs. That has resolved. She is still having issues with back pain and discomfort. It  bothers her the most when she is in the kitchen cooking or washing dishes. Discussed pacing activity. She states she rest well at night if she takes a Tylenol Arthritis for pain management. Encouraged the patient to discuss options with the pcp at upcoming appointment on 07-29-2021. 09-24-2021: The patient states she had an injection on 07-16-2021 and it was not effective for long. She is supposed to hear from the pain specialist on 10-07-2021 and will discuss further options. Rates her pain level at a 1 today but currently was sitting down and not moving around. She states that is was "calm". Would like to have something that works for it. She does take Tylenol arthritis when needed for pain. States the pain does wake her from sleep if she turns over a certain way. Education and support given.  - pain treatment goals reviewed. 04-05-2021: The patient states she is feeling better. She is thankful the procedure has been helpful. Will see the specialist next week. 07-23-2021: Would like to know recommendations for effective management of back pain and discomfort. 09-24-2021: Is working with the pain specialist. Will discuss further options for relief of chronic back pain on 10-07-2021.  - premedication prior to activity encouraged Evaluation of current treatment plan related to pain in back going down left leg and patient's adherence to plan as established by provider. 07-23-2021: Pain in legs with tingling resolved. The patient still having issues with back  pain. Sees pcp next week. Will collaborate with the pcp for recommendations for the patient with effective management of her back pain. 09-24-2021: Is working with the specialist to help with a plan of care for chronic back pain and discomfort.  Advised patient to call the office for worsening sx/sx of pain or changes noted  Provided education to patient re: alternative pain relief measures. The patient tried heat application last night but states it was not helpful.  States the pain is going down into her left leg from her back. 09-24-2021: States her pain is "calm" today and rates at a 1.  Reviewed medications with patient and discussed compliance. The patient is taking a prednisone dose pack and Robaxin. She feels the Robaxin or the prednisone is not helping. 09-24-2021: The patient is taking Tylenol arthritis for back pain and this is effective in helping her rest at night.  Collaborated with pcp regarding the patient states she can not see the pain specialist until 03-05-2021, ask about getting a "shot", will discuss with pcp for recommendations. 04-05-2021: Had procedure and is doing well post procedure. Will continue to monitor. 07-23-2021: Follow up with the pcp on 07-29-2021, the patient will discuss her pain concerns with the pcp. 09-24-2021: Will follow up with the pain specialist on 10-07-2021 and with the pcp on 10-29-2021. Discussed plans with patient for ongoing care management follow up and provided patient with direct contact information for care management team Allow patient to maintain a diary of pain ratings, timing, precipitating events, medications, treatments, and what works best to relieve pain,  Refer to support groups and self-help groups Educate patient about the use of pharmacological interventions for pain management- antianxiety, antidepressants, NSAIDS, opioid analgesics,  Explain the importance of lifestyle modifications to effective pain management  Patient Goals/Self Care Activities:  - mutually acceptable comfort goal set - pain assessed - pain management plan developed - pain treatment goals reviewed - patient response to treatment assessed - sharing of pain management plan with teachers and other caregivers encouraged Self-administers medications as prescribed Attends all scheduled provider appointments Calls pharmacy for medication refills Calls provider office for new concerns or questions Follow Up Plan: Telephone follow up  appointment with care management team member scheduled for: 11-26-2021 at 145 pm      Care Plan : RNCM: Hypertension (Adult)  Updates made by Vanita Ingles, RN since 09/24/2021 12:00 AM     Problem: RNCM: Hypertension (Hypertension)   Priority: Medium     Long-Range Goal: RNCM: Hypertension Monitored   Start Date: 02/15/2021  Expected End Date: 06/13/2022  This Visit's Progress: On track  Recent Progress: On track  Priority: Medium  Note:   Objective:  Last practice recorded BP readings:  BP Readings from Last 3 Encounters:  09/02/21 (!) 151/82  08/08/21 (!) 167/89  07/29/21 140/82     Most recent eGFR/CrCl: No results found for: EGFR  No components found for: CRCL Current Barriers:  Knowledge Deficits related to basic understanding of hypertension pathophysiology and self care management Knowledge Deficits related to understanding of medications prescribed for management of hypertension Limited Social Support Lacks social connections Unable to perform IADLs independently Does not contact provider office for questions/concerns Case Manager Clinical Goal(s):   patient will verbalize understanding of plan for hypertension management patient will attend all scheduled medical appointments: 10-29-2021 patient will demonstrate improved adherence to prescribed treatment plan for hypertension as evidenced by taking all medications as prescribed, monitoring and recording blood pressure as directed, adhering  to low sodium/DASH diet  patient will demonstrate improved health management independence as evidenced by checking blood pressure as directed and notifying PCP if SBP>160 or DBP > 90, taking all medications as prescribe, and adhering to a low sodium diet as discussed.  patient will verbalize basic understanding of hypertension disease process and self health management plan as evidenced by compliance with medications, compliance with diet, and working with CCM team to manage health and  well being  Interventions:  Collaboration with [name], NP regarding development and update of comprehensive plan of care as evidenced by provider attestation and co-signature Inter-disciplinary care team collaboration (see longitudinal plan of care) Evaluation of current treatment plan related to hypertension self management and patient's adherence to plan as established by provider. 07-23-2021: The patient is stable and has no new concerns with her blood pressure. Adheres to heart healthy diet and denies any issues with medications compliance. Will continue to monitor. 09-24-2021: The patient has been having elevations in blood pressures recently. States the reason for the 167/89 was she was having injections and she was nervous. Education and support given.  Provided education to patient re: stroke prevention, s/s of heart attack and stroke, DASH diet, complications of uncontrolled blood pressure Reviewed medications with patient and discussed importance of compliance. 09-24-2021: The patient verbalized she is compliant with medications.  Discussed plans with patient for ongoing care management follow up and provided patient with direct contact information for care management team Advised patient, providing education and rationale, to monitor blood pressure daily and record, calling PCP for findings outside established parameters.  Reviewed scheduled/upcoming provider appointments including: 10-29-2021 Patient Goals: - blood pressure trends reviewed - depression screen reviewed - home or ambulatory blood pressure monitoring encouraged Self-Care Activities: - Self administers medications as prescribed Attends all scheduled provider appointments Calls provider office for new concerns, questions, or BP outside discussed parameters Checks BP and records as discussed Follows a low sodium diet/DASH diet Follow Up Plan: Telephone follow up appointment with care management team member  scheduled for:  11-26-2021 at 145 pm    Care Plan : RNCM: Diabetes Type 2 (Adult)  Updates made by Vanita Ingles, RN since 09/24/2021 12:00 AM     Problem: RNCM: Glycemic Management (Diabetes, Type 2)   Priority: High     Long-Range Goal: RNCM: Glycemic Management Optimized   Start Date: 02/15/2021  Expected End Date: 06/06/2022  This Visit's Progress: Not on track  Recent Progress: On track  Priority: High  Note:   Objective:  Lab Results  Component Value Date   HGBA1C 8.2 (H) 01/29/2021   05-08-2021 A1C 8.1, 08-30-2021 9.1 Lab Results  Component Value Date   CREATININE 2.45 (H) 02/12/2021   CREATININE 2.60 (H) 01/29/2021   CREATININE 2.56 (H) 10/25/2020   No results found for: EGFR Current Barriers:  Knowledge Deficits related to medications used for management of diabetes Does not use cbg meter  Limited Social Support Unable to independently manage blood sugars Unable to self administer medications as prescribed Does not adhere to provider recommendations re: checking blood sugars as recommended by the provider  Does not adhere to prescribed medication regimen Lacks social connections Case Manager Clinical Goal(s):  patient will demonstrate improved adherence to prescribed treatment plan for diabetes self care/management as evidenced by: daily monitoring and recording of CBG  adherence to ADA/ carb modified diet exercise 3/4 days/week adherence to prescribed medication regimen contacting provider for new or worsened symptoms or questions Interventions:  Collaboration  with [name], NP regarding development and update of comprehensive plan of care as evidenced by provider attestation and co-signature Inter-disciplinary care team collaboration (see longitudinal plan of care) Provided education to patient about basic DM disease process Reviewed medications with patient and discussed importance of medication adherence. 09-24-2021: The patient is compliant with  medications. Has had adjustments recently Discussed plans with patient for ongoing care management follow up and provided patient with direct contact information for care management team Provided patient with written educational materials related to hypo and hyperglycemia and importance of correct treatment.07-23-2021: The patient states that after taking her "shot" sometimes her blood sugars have been dropping "low". She states 63 and sometimes lower.  She knows what to do and usually eats something, drinks something or takes glucose tablets. She also keeps tablets by her bed.  She states this is happening on a consistent basis. Education provided. Will collaborate with pcp on information provided by the patient. The patient has an appointment on 07-29-2021.  The patient does desire to talk to her about her blood sugars, back pain, and she is having issues with being hoarse after talking. She states it is getting worse and she can no longer sing. Will send in basket message letting the pcp know of patients stated concerns. Education and support provided. 09-24-2021: The patient states she still has to watch after taking her shot. She had an episode where she was at church and her blood sugar dropped. She did not check it but was diaphoretic and had to eat something to help it go back up. Discussed caring jelly beans to help when her blood sugars drop.  Reviewed scheduled/upcoming provider appointments including: 07-29-2021 at 11 am Advised patient, providing education and rationale, to check cbg bid and record, calling pcp for findings outside established parameters.  07-23-2021: The patient has been checking more frequently. The patients range is 63 to 140. This am her blood sugar was 113. 09-24-2021: the patient is using the freestyle libre now. Her average blood sugar is 163. Education on the goal of fasting <130 and post prandial of <180. Will continue to monitor.  Review of patient status, including review of  consultants reports, relevant laboratory and other test results, and medications completed. Self-Care Activities - UNABLE to independently manage DM Self administers oral medications as prescribed Self administers insulin as prescribed Attends all scheduled provider appointments Checks blood sugars as prescribed and utilize hyper and hypoglycemia protocol as needed Adheres to prescribed ADA/carb modified Patient Goals: -- barriers to adherence to treatment plan identified - blood glucose monitoring encouraged - blood glucose readings reviewed - encourage participation in diabetes education classes - individualized medical nutrition therapy provided - resources required to improve adherence to care identified - self-awareness of signs/symptoms of hypo or hyperglycemia encouraged - use of blood glucose monitoring log promoted Follow Up Plan: Telephone follow up appointment with care management team member scheduled for: 11-26-2021 at 145 pm     Plan:Telephone follow up appointment with care management team member scheduled for:  11-26-2021 at 145 pm  [place], MSN, [facility] Mobile: [phone]

## 2021-09-24 NOTE — Telephone Encounter (Signed)
  Care Management   Follow Up Note   09/24/2021 Name: Tonya [name] MRN: [id number] DOB: 1945-12-28   Referred by: [name], NP Reason for referral : Chronic Care Management (RNCM: Follow up for Chronic Disease Management and Care Coordination Needs )   Call made back to the patient and outreach completed. See new encounter.   Follow Up Plan: Telephone follow up appointment with care management team member scheduled for: 11-26-2021 at 145 pm  [name] RN, MSN, [facility] Mobile: [phone]

## 2021-09-24 NOTE — Patient Instructions (Signed)
Practice list of sounds and words using unvoiced unrestricted airflow

## 2021-09-25 ENCOUNTER — Encounter: Payer: Medicare Other | Admitting: Speech Pathology

## 2021-09-26 ENCOUNTER — Ambulatory Visit: Payer: Medicare Other | Admitting: Speech Pathology

## 2021-09-30 ENCOUNTER — Ambulatory Visit: Payer: Medicare Other | Admitting: Speech Pathology

## 2021-09-30 ENCOUNTER — Other Ambulatory Visit: Payer: Self-pay

## 2021-09-30 DIAGNOSIS — R490 Dysphonia: Secondary | ICD-10-CM

## 2021-09-30 DIAGNOSIS — E113293 Type 2 diabetes mellitus with mild nonproliferative diabetic retinopathy without macular edema, bilateral: Secondary | ICD-10-CM | POA: Diagnosis not present

## 2021-09-30 NOTE — Patient Instructions (Signed)
Using a loud, good quality voice read cognate pairs

## 2021-09-30 NOTE — Therapy (Signed)
[place] MAIN [facility] SERVICES [address] [place], [place], [address] Phone: [phone]   Fax:  [phone]  Speech Language Pathology Treatment  Patient Details  Name: Tonya [name] MRN: [id number] Date of Birth: Nov 05, 1946 Referring Provider (SLP): Carloyn Manner   Encounter Date: 09/30/2021   End of Session - 09/30/21 1332     Visit Number 5    Number of Visits 17    Date for SLP Re-Evaluation 11/06/21    Authorization Type [employer] Medicare    Authorization Time Period 09/09/2021 thru 11/06/2021    Authorization - Visit Number 5    Progress Note Due on Visit 10    SLP Start Time 0900    SLP Stop Time  1000    SLP Time Calculation (min) 60 min    Activity Tolerance Patient tolerated treatment well             Past Medical History:  Diagnosis Date   (HFpEF) heart failure with preserved ejection fraction ([place])    a. 05/2017 Echo: EF 55-60%, Gr1 DD, mildly dil LA.   CKD (chronic kidney disease), stage III ([place])    Diabetes mellitus without complication ([place])    Hyperlipidemia    Hypertension    Stroke ([place])    a. 08/2017.   Wears dentures    partial upper    Past Surgical History:  Procedure Laterality Date   CATARACT EXTRACTION W/PHACO Left 02/05/2021   Procedure: CATARACT EXTRACTION PHACO AND INTRAOCULAR LENS PLACEMENT (IOC) LEFT DIABETIC 5.47 00:51.4;  Surgeon: Birder Robson, MD;  Location: [place];  Service: Ophthalmology;  Laterality: Left;  Diabetic - insulin and oral meds   CATARACT EXTRACTION W/PHACO Right 03/19/2021   Procedure: CATARACT EXTRACTION PHACO AND INTRAOCULAR LENS PLACEMENT (IOC) RIGHT DIABETIC 6.09 00:39.6;  Surgeon: Birder Robson, MD;  Location: [place];  Service: Ophthalmology;  Laterality: Right;   COLONOSCOPY  12/15/2006   COLONOSCOPY WITH PROPOFOL N/A 06/06/2019   Procedure: COLONOSCOPY WITH PROPOFOL;  Surgeon: [name], MD;  Location: [facility] ENDOSCOPY;   Service: Gastroenterology;  Laterality: N/A;   COLONOSCOPY WITH PROPOFOL N/A 01/16/2020   Procedure: COLONOSCOPY WITH BIOPSY;  Surgeon: [name], MD;  Location: [place];  Service: Endoscopy;  Laterality: N/A;  Diabetic - insulin and oral meds   POLYPECTOMY N/A 01/16/2020   Procedure: POLYPECTOMY;  Surgeon: [name], MD;  Location: [place];  Service: Endoscopy;  Laterality: N/A;   TUBAL LIGATION      There were no vitals filed for this visit.   Subjective Assessment - 09/30/21 1329     Subjective "When they call me on the phone, what's wrong with you, did you just get up?"    Currently in Pain? No/denies                   ADULT SLP TREATMENT - 09/30/21 0001       Treatment Provided   Treatment provided Cognitive-Linquistic      Cognitive-Linquistic Treatment   Treatment focused on Voice;Patient/family/caregiver education    Skilled Treatment Pt independent with abdominal breathing; Patient instructed in flow phonation through skill level 1: establish airflow release: Articulated (unvoiced): independent with significant improvement in duration of airflow thru sentence level; Initiated skill level 2-  Airflow + Voicing: Unarticulated:  significant strain, improved pitch 212Hz  with maximal cues to achieve 84dB when reading cognate pairs              SLP Education -  09/30/21 1331     Education Details muscle tension dysphonia, increase respiratory support    Person(s) Educated Patient    Methods Explanation;Demonstration;Verbal cues;Handout    Comprehension Verbalized understanding;Need further instruction              SLP Short Term Goals - 09/19/21 1100       SLP SHORT TERM GOAL #1   Title The patient will decrease laryngeal and articulatory muscle tension by independently completing relaxation/stretching exercises.    Time 10    Period --   sessions   Status New      SLP SHORT TERM GOAL #2   Title The patient will eliminate  phonotraumatic behaviors such as chronic throat clearing, by substituting non-traumatic methods to clear mucus.    Baseline habitual throat clears    Time 10    Period --   sessions   Status New      SLP SHORT TERM GOAL #3   Title The patient will increase hydration for an eventual goal of 6-8 glasses per day and limit caffeine intake (to maximum of 1-2, 8 oz cups/day), as measured by patient report.    Baseline 8 oz    Time 10    Period --   sessions   Status New              SLP Long Term Goals - 09/20/21 0801       SLP LONG TERM GOAL #1   Title The patient will increase hydration for an eventual goal of 6-8 glasses per day and limit caffeine intake (to maximum of 1-2, 8 oz cups/day), as measured by patient report.    Baseline new goal    Time 8    Period Weeks    Status New    Target Date 11/06/21      SLP LONG TERM GOAL #2   Title The patient will demonstrate independent understanding of vocal hygiene concepts.    Baseline new goal    Time 8    Period Weeks    Target Date 11/06/22      SLP LONG TERM GOAL #3   Title The patient will eliminate phonotraumatic behaviors such as chronic throat clearing, by substituting non-traumatic methods to clear mucus.    Baseline new goal    Time 8    Period Weeks    Status New    Target Date 11/06/21      SLP LONG TERM GOAL #4   Title The patient will maximize voice quality and loudness using breath support/oral resonance for sustained vowel production, pitch glides, and hierarchal speech drill.    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 When advancing thru flow phonation to unarticulated voiced, pt exhibit significant pharyngeal resonsance. During these moments, pt begins to exhibit throat clearing. When introducing  various therapy techniques, pt has moderate difficulty performing techniques. As such, prognosis for returning to singing soprano in church is considered poor at this time. Skilled ST intervention continues to be required to target vocal hygiene, relaxed phonation and decreased respiratory support.    Speech Therapy Frequency 2x / week    Duration 8 weeks    Treatment/Interventions  SLP instruction and feedback;Patient/family education    Potential to Achieve Goals Fair    Potential Considerations Severity of impairments    SLP Home Exercise Plan provided, see pt instructions section    Consulted and Agree with Plan of Care Patient             Patient will benefit from skilled therapeutic intervention in order to improve the following deficits and impairments:   Dysphonia    Problem List Patient Active Problem List   Diagnosis Date Noted   Neuropathic pain 06/07/2020   Lumbar degenerative disc disease 06/07/2020   Lumbar spondylosis 06/07/2020   Chronic pain syndrome 04/18/2020   Sacroiliac joint pain 04/18/2020   Chronic heart failure with preserved ejection fraction (HFpEF) ([place]) 01/11/2020   Grade I diastolic dysfunction 37/62/8315   History of CVA (cerebrovascular accident) 12/01/2019   Hyperparathyroidism due to renal insufficiency ([place]) 08/31/2019   Anemia in CKD (chronic kidney disease) 02/22/2019   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 Rutherford Nail M.S., CCC-SLP, [place] Pathologist Rehabilitation  Services Office [phone]  Stormy Fabian 09/30/2021, 1:39 PM  [place] MAIN [facility] SERVICES [address] [place], [place], [address] Phone: [phone]   Fax:  [phone]   Name: ALLAN BACIGALUPI MRN: [id number] Date of Birth: Dec 19, 1945

## 2021-10-02 ENCOUNTER — Ambulatory Visit: Payer: Self-pay

## 2021-10-02 ENCOUNTER — Encounter: Payer: Medicare Other | Admitting: Speech Pathology

## 2021-10-02 ENCOUNTER — Telehealth: Payer: Self-pay

## 2021-10-02 NOTE — Chronic Care Management (AMB) (Signed)
Chronic Care Management Pharmacy Assistant   Name: Tonya [name]  MRN: [id number] DOB: 10/29/1946   Recent office visits:  07/29/21 [name] NP - Seen for diabetes - Referral to ENT - Start Claritin 10 MG daily - Follow up in 3 months  04/05/21 [name] NP - Seen for diabetes - No notes available   Recent consult visits:  08/30/21 Germain Osgood - Endocrinology - Seen for diabetes - No medication changes noted - Follow up in 12 weeks  08/08/21 Gillis Santa MD - Pain med - Seen for Lumbar facet arthropathy - Increase Tylenol arthritis to 2 tablets in morning and 2 tablets at night - Stop Plavix 7 days prior to procedure - Return in 13 days for procedure  05/29/21 Lavonia Dana MD - Nephrology - Seen for chronic kidney disease stage 4 - Started Azithromycin 250 MG tablet (antibiotic) and prednisone 10 MG tablet (steroid) for 5 days - Follow up in 3 months  05/15/21 Germain Osgood - Endocrinology - Seen for diabetes - Follow up in 12 weeks - per provider note: Change Humalog to 10 units before morning meal 8 units before mid day snack/meal 8 units before evening snack/meal 05/08/21 Germain Osgood - Endocrinology - Seen for diabetes - No notes available  05/06/21 Birder Robson - Ophthalmology - Seen for diabetes - No notes available  04/19/21 [name] MD - Cardiology - Seen for Chronic heart failure with preserved ejection fraction - No medication changes noted - Follow up in 1 year  04/15/21 Lavonia Dana MD - Nephrology - Seen for Chronic kidney disease stage 4 - stop taking furosemide and start torsemide 40 mg  - Follow up in 6 weeks  04/08/21 Birder Robson - Ophthalmology - Seen for diabetes - No notes available    Hospital visits:  None in previous 6 months  Medications: Outpatient Encounter Medications as of 10/02/2021  Medication Sig   aspirin 81 MG tablet Take 81 mg by mouth daily.   carvedilol (COREG) 3.125 MG tablet TAKE 1 TABLET BY MOUTH  TWICE  DAILY WITH A MEAL   clopidogrel (PLAVIX) 75 MG tablet TAKE 1 TABLET BY MOUTH  DAILY   docusate sodium (COLACE) 100 MG capsule Take 100 mg by mouth daily as needed for mild constipation.   ferrous sulfate 325 (65 FE) MG tablet Take 325 mg by mouth daily with breakfast.   HUMALOG KWIKPEN 100 UNIT/ML KiwkPen 6 Units. Take 6 units before breakfast, lunch, supper if glucose greater than 70   Insulin Glargine (BASAGLAR KWIKPEN [place]) Inject 8 Units into the skin daily.   NOVOFINE 32G X 6 MM MISC USE AS DIRECTED THREE TIMES DAILY WITH HUMALOG   olmesartan (BENICAR) 40 MG tablet Take 1 tablet (40 mg total) by mouth daily.   omeprazole (PRILOSEC) 20 MG capsule TAKE 1 CAPSULE BY MOUTH  TWICE DAILY   pioglitazone (ACTOS) 30 MG tablet Take 30 mg by mouth daily.   rosuvastatin (CRESTOR) 5 MG tablet TAKE 1 TABLET BY MOUTH  DAILY   Torsemide 40 MG TABS Take by mouth.   Vitamin D, Cholecalciferol, 50 MCG (2000 UT) CAPS Take 2,000 Units by mouth daily.   No facility-administered encounter medications on file as of 10/02/2021.   Have you had any problems recently with your health? Have you had any problems with your pharmacy? What issues or side effects are you having with your medications? What would you like me to pass along to [facility] for  them to help you with?  What can we do to take care of you better?    Attempted to reach the patient a total of 3 times. Unable to contact her for this assessment call    Care Gaps: None   Star Rating Drugs: HUMALOG KWIKPEN 100 UNIT/ML KiwkPen 06/18/2021 Insulin Glargine (BASAGLAR KWIKPEN [place]) 07/29/21 olmesartan (BENICAR) 40 MG tablet 04/06/2021 90 DS  pioglitazone (ACTOS) 30 MG tablet 05/01/2021 90 DS  rosuvastatin (CRESTOR) 5 MG tablet 05/30/2021 90 DS     Andee Poles, CMA

## 2021-10-02 NOTE — Telephone Encounter (Signed)
Summary: high blood pressure reading   Caller:  gail Nurse from [facility] ([phone], ext [phone]   Primary [place], Tonya [name] (Patient)  Tonya [name] [name] (Patient) General - Other  Reason for MCE:YEMV Nurse from [facility] just want to relay info, about patients BP is 163/98 and heart rate is is 100. She is gonna call patient, she had a hard time reaching her at home number, so I gave the cell number.   10/02/2021 01:16 PM [place], [place] POOL Routine   Left message for [name] with [facility]. Attempted to reach pt. On home and mobile numbers, unable to leave message.

## 2021-10-02 NOTE — Telephone Encounter (Signed)
Attempted to reach pt. Voice mailbox has not been set up, unable to leave message.

## 2021-10-03 ENCOUNTER — Ambulatory Visit: Payer: Medicare Other | Admitting: Speech Pathology

## 2021-10-03 ENCOUNTER — Other Ambulatory Visit: Payer: Self-pay

## 2021-10-03 DIAGNOSIS — R490 Dysphonia: Secondary | ICD-10-CM

## 2021-10-03 NOTE — Telephone Encounter (Signed)
Attempted to reach pt. On both numbers in chart. No answer and unable to leave message. Left message for daughter on DPR, Ms. Hester, to call back. Baker Janus with [employer] reports she monitors pt.'s vital signs and weight electronically. BP readings this week are elevated 168/93 and 179/98. Weight on 09/27/21 166.6 lbs. And on 10/01/21 170. Reports pt. Has CHF and is concerned about the pt.'s weight gain.

## 2021-10-03 NOTE — Therapy (Signed)
[place] MAIN [facility] SERVICES [address] [place], [place], [address] Phone: [phone]   Fax:  [phone]  Speech Language Pathology Treatment  Patient Details  Name: Tonya [name] MRN: [id number] Date of Birth: Sep 17, 1946 Referring Provider (SLP): Carloyn Manner   Encounter Date: 10/03/2021   End of Session - 10/03/21 1535     Visit Number 6    Number of Visits 17    Date for SLP Re-Evaluation 11/06/21    Authorization Type [employer] Medicare    Authorization Time Period 09/09/2021 thru 11/06/2021    Authorization - Visit Number 6    Progress Note Due on Visit 10    SLP Start Time 0900    SLP Stop Time  1000    SLP Time Calculation (min) 60 min    Activity Tolerance Patient tolerated treatment well             Past Medical History:  Diagnosis Date   (HFpEF) heart failure with preserved ejection fraction ([place])    a. 05/2017 Echo: EF 55-60%, Gr1 DD, mildly dil LA.   CKD (chronic kidney disease), stage III ([place])    Diabetes mellitus without complication ([place])    Hyperlipidemia    Hypertension    Stroke ([place])    a. 08/2017.   Wears dentures    partial upper    Past Surgical History:  Procedure Laterality Date   CATARACT EXTRACTION W/PHACO Left 02/05/2021   Procedure: CATARACT EXTRACTION PHACO AND INTRAOCULAR LENS PLACEMENT (IOC) LEFT DIABETIC 5.47 00:51.4;  Surgeon: Birder Robson, MD;  Location: [place];  Service: Ophthalmology;  Laterality: Left;  Diabetic - insulin and oral meds   CATARACT EXTRACTION W/PHACO Right 03/19/2021   Procedure: CATARACT EXTRACTION PHACO AND INTRAOCULAR LENS PLACEMENT (IOC) RIGHT DIABETIC 6.09 00:39.6;  Surgeon: Birder Robson, MD;  Location: [place];  Service: Ophthalmology;  Laterality: Right;   COLONOSCOPY  12/15/2006   COLONOSCOPY WITH PROPOFOL N/A 06/06/2019   Procedure: COLONOSCOPY WITH PROPOFOL;  Surgeon: [name], MD;  [facility] ENDOSCOPY;   Service: Gastroenterology;  Laterality: N/A;   COLONOSCOPY WITH PROPOFOL N/A 01/16/2020   Procedure: COLONOSCOPY WITH BIOPSY;  Surgeon: [name], MD;  Location: [place];  Service: Endoscopy;  Laterality: N/A;  Diabetic - insulin and oral meds   POLYPECTOMY N/A 01/16/2020   Procedure: POLYPECTOMY;  Surgeon: [name], MD;  Location: [place];  Service: Endoscopy;  Laterality: N/A;   TUBAL LIGATION      There were no vitals filed for this visit.   Subjective Assessment - 10/03/21 1536     Subjective "I had a funeral to go to"    Currently in Pain? No/denies                   ADULT SLP TREATMENT - 10/03/21 0001       Treatment Provided   Treatment provided Cognitive-Linquistic      Cognitive-Linquistic Treatment   Treatment focused on Voice;Patient/family/caregiver education    Skilled Treatment Pt was Mod I with using a good quality LOUD voice when reading list of cognate pairs (82dB; 276Hz ); moderate faded to minimal cues for carryover into simple conversaiton about her husband/children and minimal assistance for carryover into simple singing              SLP Education - 10/03/21 1538     Education Details noted improvement throughout session    Person(s) Educated Patient    Methods Explanation;Demonstration;Verbal  cues;Handout    Comprehension Verbalized understanding;Returned demonstration;Need further instruction              SLP Short Term Goals - 09/19/21 1100       SLP SHORT TERM GOAL #1   Title The patient will decrease laryngeal and articulatory muscle tension by independently completing relaxation/stretching exercises.    Time 10    Period --   sessions   Status New      SLP SHORT TERM GOAL #2   Title The patient will eliminate phonotraumatic behaviors such as chronic throat clearing, by substituting non-traumatic methods to clear mucus.    Baseline habitual throat clears    Time 10    Period --   sessions   Status  New      SLP SHORT TERM GOAL #3   Title The patient will increase hydration for an eventual goal of 6-8 glasses per day and limit caffeine intake (to maximum of 1-2, 8 oz cups/day), as measured by patient report.    Baseline 8 oz    Time 10    Period --   sessions   Status New              SLP Long Term Goals - 09/20/21 0801       SLP LONG TERM GOAL #1   Title The patient will increase hydration for an eventual goal of 6-8 glasses per day and limit caffeine intake (to maximum of 1-2, 8 oz cups/day), as measured by patient report.    Baseline new goal    Time 8    Period Weeks    Status New    Target Date 11/06/21      SLP LONG TERM GOAL #2   Title The patient will demonstrate independent understanding of vocal hygiene concepts.    Baseline new goal    Time 8    Period Weeks    Target Date 11/06/22      SLP LONG TERM GOAL #3   Title The patient will eliminate phonotraumatic behaviors such as chronic throat clearing, by substituting non-traumatic methods to clear mucus.    Baseline new goal    Time 8    Period Weeks    Status New    Target Date 11/06/21      SLP LONG TERM GOAL #4   Title The patient will maximize voice quality and loudness using breath support/oral resonance for sustained vowel production, pitch glides, and hierarchal speech drill.    Baseline new goal    Time 8    Period Weeks    Status New    Target Date 11/06/22              Plan - 10/03/21 1535     Clinical Impression Statement Pt presents with improving dysphonia and as a result her vocal quality, projection, endurance and vocal hygiene are improving. More assistance is required for carryover into conversation and off-the-cuff remarks. Therefore skilled ST intervention continues to be required to target vocal hygiene, relaxed phonation and increased respiratory support.    Speech Therapy Frequency 2x / week    Duration 8 weeks    Treatment/Interventions SLP instruction and  feedback;Patient/family education    Potential to Achieve Goals Good    Potential Considerations Severity of impairments    SLP Home Exercise Plan provided, see pt instructions section    Consulted and Agree with Plan of Care Patient  Patient will benefit from skilled therapeutic intervention in order to improve the following deficits and impairments:   Dysphonia    Problem List Patient Active Problem List   Diagnosis Date Noted   Neuropathic pain 06/07/2020   Lumbar degenerative disc disease 06/07/2020   Lumbar spondylosis 06/07/2020   Chronic pain syndrome 04/18/2020   Sacroiliac joint pain 04/18/2020   Chronic heart failure with preserved ejection fraction (HFpEF) ([place]) 01/11/2020   Grade I diastolic dysfunction 01/14/4387   History of CVA (cerebrovascular accident) 12/01/2019   Hyperparathyroidism due to renal insufficiency ([place]) 08/31/2019   Anemia in CKD (chronic kidney disease) 02/22/2019   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 Rutherford Nail M.S., CCC-SLP, [place] Pathologist Rehabilitation Services Office [phone]   Stormy Fabian 10/03/2021, 3:41 PM  [place] MAIN [facility] SERVICES [address] [place], [place], [address] Phone: [phone]   Fax:  [phone]   Name: Tonya [name] MRN: [id number] Date of Birth: 11-Jul-1946

## 2021-10-03 NOTE — Patient Instructions (Signed)
Continue using relaxed phonation and improved breath support when reading aloud

## 2021-10-03 NOTE — Telephone Encounter (Signed)
Baker Janus with [facility] calling back. States she is still not able to reach pt. Will attempt to call pt.

## 2021-10-07 ENCOUNTER — Telehealth: Payer: Medicare Other | Admitting: Student in an Organized Health Care Education/Training Program

## 2021-10-07 NOTE — Telephone Encounter (Signed)
Please see concerns below.

## 2021-10-08 ENCOUNTER — Ambulatory Visit: Payer: Medicare Other | Admitting: Speech Pathology

## 2021-10-08 NOTE — Telephone Encounter (Signed)
Attempted to call pt, unable to leave vm due to vm not being set up.

## 2021-10-10 ENCOUNTER — Encounter: Payer: Medicare Other | Admitting: Speech Pathology

## 2021-10-10 ENCOUNTER — Ambulatory Visit: Payer: Medicare Other | Admitting: Speech Pathology

## 2021-10-10 ENCOUNTER — Other Ambulatory Visit: Payer: Self-pay

## 2021-10-10 DIAGNOSIS — R490 Dysphonia: Secondary | ICD-10-CM

## 2021-10-10 NOTE — Patient Instructions (Signed)
Morning routine, throat/neck stretches and abdominal breathing followed by continued flow phonation exercises on daily basis with easy onset /m/ words in progression (single word per breath, x2 words [place], x3 words/breath)

## 2021-10-10 NOTE — Therapy (Signed)
[place] MAIN [facility] SERVICES [address] [place], [place], [address] Phone: [phone]   Fax:  [phone]  Speech Language Pathology Treatment  Patient Details  Name: Tonya [name] MRN: [id number] Date of Birth: 1946/06/17 Referring Provider (SLP): Carloyn Manner   Encounter Date: 10/10/2021   End of Session - 10/10/21 1023     Visit Number 7    Number of Visits 17    Date for SLP Re-Evaluation 11/06/21    Authorization Type [employer]    Authorization Time Period 09/09/2021 thru 11/06/2021    Authorization - Visit Number 7    Progress Note Due on Visit 10    SLP Start Time 0900    SLP Stop Time  1000    SLP Time Calculation (min) 60 min    Activity Tolerance Patient tolerated treatment well             Past Medical History:  Diagnosis Date   (HFpEF) heart failure with preserved ejection fraction ([place])    a. 05/2017 Echo: EF 55-60%, Gr1 DD, mildly dil LA.   CKD (chronic kidney disease), stage III ([place])    Diabetes mellitus without complication ([place])    Hyperlipidemia    Hypertension    Stroke ([place])    a. 08/2017.   Wears dentures    partial upper    Past Surgical History:  Procedure Laterality Date   CATARACT EXTRACTION W/PHACO Left 02/05/2021   Procedure: CATARACT EXTRACTION PHACO AND INTRAOCULAR LENS PLACEMENT (IOC) LEFT DIABETIC 5.47 00:51.4;  Surgeon: Birder Robson, MD;  Location: [place];  Service: Ophthalmology;  Laterality: Left;  Diabetic - insulin and oral meds   CATARACT EXTRACTION W/PHACO Right 03/19/2021   Procedure: CATARACT EXTRACTION PHACO AND INTRAOCULAR LENS PLACEMENT (IOC) RIGHT DIABETIC 6.09 00:39.6;  Surgeon: Birder Robson, MD;  Location: [place];  Service: Ophthalmology;  Laterality: Right;   COLONOSCOPY  12/15/2006   COLONOSCOPY WITH PROPOFOL N/A 06/06/2019   Procedure: COLONOSCOPY WITH PROPOFOL;  Surgeon: [name], MD;  Location: [facility] ENDOSCOPY;   Service: Gastroenterology;  Laterality: N/A;   COLONOSCOPY WITH PROPOFOL N/A 01/16/2020   Procedure: COLONOSCOPY WITH BIOPSY;  Surgeon: [name], MD;  Location: [place];  Service: Endoscopy;  Laterality: N/A;  Diabetic - insulin and oral meds   POLYPECTOMY N/A 01/16/2020   Procedure: POLYPECTOMY;  Surgeon: [name], MD;  Location: [place];  Service: Endoscopy;  Laterality: N/A;   TUBAL LIGATION      There were no vitals filed for this visit.   Subjective Assessment - 10/10/21 1006     Subjective "I do it" re: homework from prior ST session. Patient pleasant and engaged for therapy session    Currently in Pain? No/denies                   ADULT SLP TREATMENT - 10/10/21 0001       General Information   Behavior/Cognition Alert;Cooperative;Pleasant mood      Treatment Provided   Treatment provided Cognitive-Linquistic      Cognitive-Linquistic Treatment   Treatment focused on Voice    Skilled Treatment Patient demonstrated +carryover of flow phonation for single word level though challenged with spontaneous 5-10 word utterances as demonstrated by increased hoarse/harsh vocal quality. Initiated training resonant voice therapy as alternative tool for improved vocal quality with tactile biofeedback via feeling vibration of superior airflow phonation. Patient demonstrated carryover of skills to functional reading activity (bible verses) with 60%  accuracy x5, strained voice occurences noted during increased length of utterance though improved with cues for increased rest time during natural pauses. Patient demonstrated carryover of trained strategies to conversational level with 70% x10 opportunities on initial set and improved to 80% accuracy x10 on second set. All occurences of harsh/strained voice improved on second attempt with verbal cue for strategy use.              SLP Education - 10/10/21 1022     Education Details flow phonation, easy onset,  and resonant voice strategies    Person(s) Educated Patient    Methods Explanation;Demonstration;Tactile cues;Verbal cues    Comprehension Verbalized understanding;Need further instruction              SLP Short Term Goals - 09/19/21 1100       SLP SHORT TERM GOAL #1   Title The patient will decrease laryngeal and articulatory muscle tension by independently completing relaxation/stretching exercises.    Time 10    Period --   sessions   Status New      SLP SHORT TERM GOAL #2   Title The patient will eliminate phonotraumatic behaviors such as chronic throat clearing, by substituting non-traumatic methods to clear mucus.    Baseline habitual throat clears    Time 10    Period --   sessions   Status New      SLP SHORT TERM GOAL #3   Title The patient will increase hydration for an eventual goal of 6-8 glasses per day and limit caffeine intake (to maximum of 1-2, 8 oz cups/day), as measured by patient report.    Baseline 8 oz    Time 10    Period --   sessions   Status New              SLP Long Term Goals - 09/20/21 0801       SLP LONG TERM GOAL #1   Title The patient will increase hydration for an eventual goal of 6-8 glasses per day and limit caffeine intake (to maximum of 1-2, 8 oz cups/day), as measured by patient report.    Baseline new goal    Time 8    Period Weeks    Status New    Target Date 11/06/21      SLP LONG TERM GOAL #2   Title The patient will demonstrate independent understanding of vocal hygiene concepts.    Baseline new goal    Time 8    Period Weeks    Target Date 11/06/22      SLP LONG TERM GOAL #3   Title The patient will eliminate phonotraumatic behaviors such as chronic throat clearing, by substituting non-traumatic methods to clear mucus.    Baseline new goal    Time 8    Period Weeks    Status New    Target Date 11/06/21      SLP LONG TERM GOAL #4   Title The patient will maximize voice quality and loudness using breath  support/oral resonance for sustained vowel production, pitch glides, and hierarchal speech drill.    Baseline new goal    Time 8    Period Weeks    Status New    Target Date 11/06/22              Plan - 10/10/21 1023     Clinical Impression Statement Pt presents with improving dysphonia and as a result her vocal quality, projection, endurance and vocal hygiene  are improving. More assistance is required for carryover into conversation and real-life situational challenges (background noisy environments, phone calls). Therefore skilled ST intervention continues to be required to target vocal hygiene, relaxed phonation and increased respiratory support for improved voice and communication.    Speech Therapy Frequency 2x / week    Duration 8 weeks    Treatment/Interventions SLP instruction and feedback;Patient/family education    Potential to Achieve Goals Good    Potential Considerations Severity of impairments    SLP Home Exercise Plan provided, see pt instructions section    Consulted and Agree with Plan of Care Patient             Patient will benefit from skilled therapeutic intervention in order to improve the following deficits and impairments:   Dysphonia    Problem List Patient Active Problem List   Diagnosis Date Noted   Neuropathic pain 06/07/2020   Lumbar degenerative disc disease 06/07/2020   Lumbar spondylosis 06/07/2020   Chronic pain syndrome 04/18/2020   Sacroiliac joint pain 04/18/2020   Chronic heart failure with preserved ejection fraction (HFpEF) ([place]) 01/11/2020   Grade I diastolic dysfunction 92/44/6286   History of CVA (cerebrovascular accident) 12/01/2019   Hyperparathyroidism due to renal insufficiency ([place]) 08/31/2019   Anemia in CKD (chronic kidney disease) 02/22/2019   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 CCC-SLP [place] 10/10/2021, 10:25 AM  [place] MAIN [facility] SERVICES [address] [place], [place], [address] Phone: [phone]   Fax:  [phone]   Name: ROSALIND GUIDO MRN: [id number] Date of Birth: 08-12-1946

## 2021-10-14 DIAGNOSIS — E1165 Type 2 diabetes mellitus with hyperglycemia: Secondary | ICD-10-CM | POA: Diagnosis not present

## 2021-10-14 DIAGNOSIS — I5032 Chronic diastolic (congestive) heart failure: Secondary | ICD-10-CM | POA: Diagnosis not present

## 2021-10-14 DIAGNOSIS — I10 Essential (primary) hypertension: Secondary | ICD-10-CM | POA: Diagnosis not present

## 2021-10-14 DIAGNOSIS — E1140 Type 2 diabetes mellitus with diabetic neuropathy, unspecified: Secondary | ICD-10-CM

## 2021-10-15 ENCOUNTER — Other Ambulatory Visit: Payer: Self-pay

## 2021-10-15 ENCOUNTER — Ambulatory Visit: Payer: Medicare Other | Attending: Otolaryngology | Admitting: Speech Pathology

## 2021-10-15 DIAGNOSIS — R490 Dysphonia: Secondary | ICD-10-CM | POA: Diagnosis not present

## 2021-10-15 NOTE — Therapy (Signed)
[place] MAIN [facility] SERVICES [address] [place], [place], [address] Phone: [phone]   Fax:  [phone]  Speech Language Pathology Treatment  Patient Details  Name: Tonya [name] MRN: [id number] Date of Birth: 22-Jan-1946 Referring Provider (SLP): Carloyn Manner   Encounter Date: 10/15/2021   End of Session - 10/15/21 1004     Visit Number 8    Number of Visits 17    Date for SLP Re-Evaluation 11/06/21    Authorization Type [employer] Medicare    Authorization Time Period 09/09/2021 thru 11/06/2021    Authorization - Visit Number 8    Progress Note Due on Visit 10    SLP Start Time 0900    SLP Stop Time  1000    SLP Time Calculation (min) 60 min    Activity Tolerance Patient tolerated treatment well             Past Medical History:  Diagnosis Date   (HFpEF) heart failure with preserved ejection fraction ([place])    a. 05/2017 Echo: EF 55-60%, Gr1 DD, mildly dil LA.   CKD (chronic kidney disease), stage III ([place])    Diabetes mellitus without complication ([place])    Hyperlipidemia    Hypertension    Stroke ([place])    a. 08/2017.   Wears dentures    partial upper    Past Surgical History:  Procedure Laterality Date   CATARACT EXTRACTION W/PHACO Left 02/05/2021   Procedure: CATARACT EXTRACTION PHACO AND INTRAOCULAR LENS PLACEMENT (IOC) LEFT DIABETIC 5.47 00:51.4;  Surgeon: Birder Robson, MD;  Location: [place];  Service: Ophthalmology;  Laterality: Left;  Diabetic - insulin and oral meds   CATARACT EXTRACTION W/PHACO Right 03/19/2021   Procedure: CATARACT EXTRACTION PHACO AND INTRAOCULAR LENS PLACEMENT (IOC) RIGHT DIABETIC 6.09 00:39.6;  Surgeon: Birder Robson, MD;  Location: [place];  Service: Ophthalmology;  Laterality: Right;   COLONOSCOPY  12/15/2006   COLONOSCOPY WITH PROPOFOL N/A 06/06/2019   Procedure: COLONOSCOPY WITH PROPOFOL;  Surgeon: [name], MD;  Location: [facility] ENDOSCOPY;   Service: Gastroenterology;  Laterality: N/A;   COLONOSCOPY WITH PROPOFOL N/A 01/16/2020   Procedure: COLONOSCOPY WITH BIOPSY;  Surgeon: [name], MD;  Location: [place];  Service: Endoscopy;  Laterality: N/A;  Diabetic - insulin and oral meds   POLYPECTOMY N/A 01/16/2020   Procedure: POLYPECTOMY;  Surgeon: [name], MD;  Location: [place];  Service: Endoscopy;  Laterality: N/A;   TUBAL LIGATION      There were no vitals filed for this visit.   Subjective Assessment - 10/15/21 0907     Subjective I lost my words list.    Currently in Pain? Yes    Pain Score 10-Worst pain ever    Pain Location Back    Pain Type Chronic pain    Effect of Pain on Daily Activities intermittent and known by pcp                   ADULT SLP TREATMENT - 10/15/21 0001       Cognitive-Linquistic Treatment   Skilled Treatment Mod-max assistance required for relaxation/stretching exercises as warmup. Focuse tis date targeted skill level  of flow phonation with /u/ sound and biofeedback with tissue. Mod assist for identification of continuous and noncontinuous flow. Max cues to take sips of water throughout session for pharyngeal hydration. IND in identificaiton of phonotraumatic behaviors though max assist to utilize alternative methods (sip water, hard swallow). Mod-max verbal and  visual cues for transition to articulated airflow release to multiple sounds improved with continuous trials and cues. Conversational speech following exercises improved with reduced harsh/strained quality.              SLP Education - 10/15/21 1004     Education Details flow phonation with biofeedback via tissue use    Methods Explanation;Demonstration    Comprehension Verbalized understanding;Need further instruction              SLP Short Term Goals - 09/19/21 1100       SLP SHORT TERM GOAL #1   Title The patient will decrease laryngeal and articulatory muscle tension by  independently completing relaxation/stretching exercises.    Time 10    Period --   sessions   Status New      SLP SHORT TERM GOAL #2   Title The patient will eliminate phonotraumatic behaviors such as chronic throat clearing, by substituting non-traumatic methods to clear mucus.    Baseline habitual throat clears    Time 10    Period --   sessions   Status New      SLP SHORT TERM GOAL #3   Title The patient will increase hydration for an eventual goal of 6-8 glasses per day and limit caffeine intake (to maximum of 1-2, 8 oz cups/day), as measured by patient report.    Baseline 8 oz    Time 10    Period --   sessions   Status New              SLP Long Term Goals - 09/20/21 0801       SLP LONG TERM GOAL #1   Title The patient will increase hydration for an eventual goal of 6-8 glasses per day and limit caffeine intake (to maximum of 1-2, 8 oz cups/day), as measured by patient report.    Baseline new goal    Time 8    Period Weeks    Status New    Target Date 11/06/21      SLP LONG TERM GOAL #2   Title The patient will demonstrate independent understanding of vocal hygiene concepts.    Baseline new goal    Time 8    Period Weeks    Target Date 11/06/22      SLP LONG TERM GOAL #3   Title The patient will eliminate phonotraumatic behaviors such as chronic throat clearing, by substituting non-traumatic methods to clear mucus.    Baseline new goal    Time 8    Period Weeks    Status New    Target Date 11/06/21      SLP LONG TERM GOAL #4   Title The patient will maximize voice quality and loudness using breath support/oral resonance for sustained vowel production, pitch glides, and hierarchal speech drill.    Baseline new goal    Time 8    Period Weeks    Status New    Target Date 11/06/22              Plan - 10/15/21 1005     Clinical Impression Statement Pt presents with improving dysphonia and as a result her vocal quality, projection, endurance and  vocal hygiene are improving. Trained flow phonation,patient is currently at skill level one with assistance for transference from unarticulated to articulated flow phonation. Therefore skilled ST intervention continues to be required to target vocal hygiene, relaxed phonation and increased respiratory support for improved voice and communication.  Speech Therapy Frequency 2x / week    Duration 8 weeks    Treatment/Interventions SLP instruction and feedback;Patient/family education    Potential to Achieve Goals Good    Potential Considerations Severity of impairments;Cooperation/participation level    SLP Home Exercise Plan provided, see pt instructions section    Consulted and Agree with Plan of Care Patient             Patient will benefit from skilled therapeutic intervention in order to improve the following deficits and impairments:   Dysphonia    Problem List Patient Active Problem List   Diagnosis Date Noted   Neuropathic pain 06/07/2020   Lumbar degenerative disc disease 06/07/2020   Lumbar spondylosis 06/07/2020   Chronic pain syndrome 04/18/2020   Sacroiliac joint pain 04/18/2020   Chronic heart failure with preserved ejection fraction (HFpEF) ([place]) 01/11/2020   Grade I diastolic dysfunction 24/46/9507   History of CVA (cerebrovascular accident) 12/01/2019   Hyperparathyroidism due to renal insufficiency ([place]) 08/31/2019   Anemia in CKD (chronic kidney disease) 02/22/2019   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 Fletcher Rathbun, M.A. [place] [phone]  Dalbert Batman 10/15/2021, 10:06 AM  [place] MAIN [facility] SERVICES [address] [place], [place], [address] Phone: [phone]   Fax:  [phone]   Name: MAKAI AGOSTINELLI MRN: [id number] Date of Birth: December 31, 1945

## 2021-10-15 NOTE — Patient Instructions (Signed)
Home exercise program, written instructions provided for relaxation/stretches and flow phonation progression from unarticulated to articulated.

## 2021-10-17 ENCOUNTER — Telehealth: Payer: Self-pay | Admitting: Nurse Practitioner

## 2021-10-17 ENCOUNTER — Ambulatory Visit: Payer: Medicare Other | Admitting: Speech Pathology

## 2021-10-17 ENCOUNTER — Other Ambulatory Visit: Payer: Self-pay | Admitting: Nurse Practitioner

## 2021-10-17 ENCOUNTER — Other Ambulatory Visit: Payer: Self-pay

## 2021-10-17 DIAGNOSIS — R490 Dysphonia: Secondary | ICD-10-CM

## 2021-10-17 NOTE — Therapy (Signed)
[place] MAIN [facility] SERVICES [address] [place], [place], [address] Phone: [phone]   Fax:  [phone]  Speech Language Pathology Treatment  Patient Details  Name: Tonya [name] MRN: [id number] Date of Birth: 1946/08/14 Referring Provider (SLP): Carloyn Manner   Encounter Date: 10/17/2021   End of Session - 10/17/21 1640     Visit Number 9    Number of Visits 17    Date for SLP Re-Evaluation 11/06/21    Authorization Type [employer] Medicare    Authorization Time Period 09/09/2021 thru 11/06/2021    Authorization - Visit Number 9    Progress Note Due on Visit 10    SLP Start Time 0900    SLP Stop Time  1000    SLP Time Calculation (min) 60 min    Activity Tolerance Patient tolerated treatment well             Past Medical History:  Diagnosis Date   (HFpEF) heart failure with preserved ejection fraction ([place])    a. 05/2017 Echo: EF 55-60%, Gr1 DD, mildly dil LA.   CKD (chronic kidney disease), stage III ([place])    Diabetes mellitus without complication ([place])    Hyperlipidemia    Hypertension    Stroke ([place])    a. 08/2017.   Wears dentures    partial upper    Past Surgical History:  Procedure Laterality Date   CATARACT EXTRACTION W/PHACO Left 02/05/2021   Procedure: CATARACT EXTRACTION PHACO AND INTRAOCULAR LENS PLACEMENT (IOC) LEFT DIABETIC 5.47 00:51.4;  Surgeon: Birder Robson, MD;  Location: [place];  Service: Ophthalmology;  Laterality: Left;  Diabetic - insulin and oral meds   CATARACT EXTRACTION W/PHACO Right 03/19/2021   Procedure: CATARACT EXTRACTION PHACO AND INTRAOCULAR LENS PLACEMENT (IOC) RIGHT DIABETIC 6.09 00:39.6;  Surgeon: Birder Robson, MD;  Location: [place];  Service: Ophthalmology;  Laterality: Right;   COLONOSCOPY  12/15/2006   COLONOSCOPY WITH PROPOFOL N/A 06/06/2019   Procedure: COLONOSCOPY WITH PROPOFOL;  Surgeon: [name], MD;  Location[facility] ENDOSCOPY;   Service: Gastroenterology;  Laterality: N/A;   COLONOSCOPY WITH PROPOFOL N/A 01/16/2020   Procedure: COLONOSCOPY WITH BIOPSY;  Surgeon: [name], MD;  Location: [place];  Service: Endoscopy;  Laterality: N/A;  Diabetic - insulin and oral meds   POLYPECTOMY N/A 01/16/2020   Procedure: POLYPECTOMY;  Surgeon: [name], MD;  Location: [place];  Service: Endoscopy;  Laterality: N/A;   TUBAL LIGATION      There were no vitals filed for this visit.   Subjective Assessment - 10/17/21 1636     Subjective "I am doing ok"    Currently in Pain? No/denies                   ADULT SLP TREATMENT - 10/17/21 0001       Treatment Provided   Treatment provided Cognitive-Linquistic      Cognitive-Linquistic Treatment   Treatment focused on Voice;Patient/family/caregiver education    Skilled Treatment Improvement noted in vocal quality during conversational speech with sustained phonation, increased vocal intensity, endurance with improved objective data of 207 Hz and 81 dB. Pt continues to require Maximal multimodal (visual, tactile) for abdominal breathing and increase respiratory support. Pt's vocal quality improved greatly after pt began utilizing abdominal breathing with her pitch rising to 237 Hz and loudness improved to 83 dB. Educated on importance of abdominal breathing as well as consistent practice outside of therapy sessions.  SLP Education - 10/17/21 1640     Education Details important of consistent practice outside of ST sessions    Person(s) Educated Patient    Methods Explanation;Demonstration;Verbal cues;Tactile cues    Comprehension Verbalized understanding;Need further instruction              SLP Short Term Goals - 09/19/21 1100       SLP SHORT TERM GOAL #1   Title The patient will decrease laryngeal and articulatory muscle tension by independently completing relaxation/stretching exercises.    Time 10    Period --    sessions   Status New      SLP SHORT TERM GOAL #2   Title The patient will eliminate phonotraumatic behaviors such as chronic throat clearing, by substituting non-traumatic methods to clear mucus.    Baseline habitual throat clears    Time 10    Period --   sessions   Status New      SLP SHORT TERM GOAL #3   Title The patient will increase hydration for an eventual goal of 6-8 glasses per day and limit caffeine intake (to maximum of 1-2, 8 oz cups/day), as measured by patient report.    Baseline 8 oz    Time 10    Period --   sessions   Status New              SLP Long Term Goals - 09/20/21 0801       SLP LONG TERM GOAL #1   Title The patient will increase hydration for an eventual goal of 6-8 glasses per day and limit caffeine intake (to maximum of 1-2, 8 oz cups/day), as measured by patient report.    Baseline new goal    Time 8    Period Weeks    Status New    Target Date 11/06/21      SLP LONG TERM GOAL #2   Title The patient will demonstrate independent understanding of vocal hygiene concepts.    Baseline new goal    Time 8    Period Weeks    Target Date 11/06/22      SLP LONG TERM GOAL #3   Title The patient will eliminate phonotraumatic behaviors such as chronic throat clearing, by substituting non-traumatic methods to clear mucus.    Baseline new goal    Time 8    Period Weeks    Status New    Target Date 11/06/21      SLP LONG TERM GOAL #4   Title The patient will maximize voice quality and loudness using breath support/oral resonance for sustained vowel production, pitch glides, and hierarchal speech drill.    Baseline new goal    Time 8    Period Weeks    Status New    Target Date 11/06/22              Plan - 10/17/21 1640     Clinical Impression Statement Pt presents with improving dysphonia and as a result her vocal quality, projection, endurance and vocal hygiene are improving. Pt's progress is hampered by lack of follow thru with HEP.  Stressed importance during today's session.  Therefore skilled ST intervention continues to be required to target vocal hygiene, relaxed phonation and increased respiratory support for improved voice and communication.    Speech Therapy Frequency 2x / week    Duration 8 weeks    Treatment/Interventions SLP instruction and feedback;Patient/family education    Potential to Achieve Goals Good  Potential Considerations Severity of impairments;Cooperation/participation level    SLP Home Exercise Plan provided, see pt instructions section    Consulted and Agree with Plan of Care Patient             Patient will benefit from skilled therapeutic intervention in order to improve the following deficits and impairments:   Dysphonia    Problem List Patient Active Problem List   Diagnosis Date Noted   Neuropathic pain 06/07/2020   Lumbar degenerative disc disease 06/07/2020   Lumbar spondylosis 06/07/2020   Chronic pain syndrome 04/18/2020   Sacroiliac joint pain 04/18/2020   Chronic heart failure with preserved ejection fraction (HFpEF) ([place]) 01/11/2020   Grade I diastolic dysfunction 12/07/8249   History of CVA (cerebrovascular accident) 12/01/2019   Hyperparathyroidism due to renal insufficiency ([place]) 08/31/2019   Anemia in CKD (chronic kidney disease) 02/22/2019   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 Rutherford Nail M.S., CCC-SLP, [place] Pathologist Rehabilitation Services Office [phone]  Stormy Fabian 10/17/2021, 4:42 PM  [place] MAIN [facility] SERVICES [address] [place], [place], [address] Phone: [phone]   Fax:  [phone]   Name: Tonya [name] MRN: [id number] Date of Birth: 1946-01-19

## 2021-10-17 NOTE — Patient Instructions (Signed)
Abdominal breathing exercises Sustained phonation using abdominal breath support

## 2021-10-17 NOTE — Telephone Encounter (Signed)
Left message for patient to call back and schedule the Medicare Annual Wellness Visit (AWV) virtually or by telephone.  Last AWV 07/30/20  Please schedule at anytime with [facility]FP-Nurse Health Advisor.  45 minute appointment  Any questions, please call me at [phone]

## 2021-10-18 ENCOUNTER — Encounter: Payer: Medicare Other | Admitting: Speech Pathology

## 2021-10-18 NOTE — Telephone Encounter (Signed)
Requested medications are due for refill today yes  Requested medications are on the active medication list yes  Last refill 08/23/21 (mail order)  Last visit 07/29/21  Future visit scheduled 10/29/21  Notes to clinic failed protocol of valid labs within 180 days, please assess.  Requested Prescriptions  Pending Prescriptions Disp Refills   clopidogrel (PLAVIX) 75 MG tablet [Pharmacy Med Name: Clopidogrel Bisulfate 75 MG Oral Tablet] 90 tablet 3    Sig: TAKE 1 TABLET BY MOUTH  DAILY     Hematology: Antiplatelets - clopidogrel Failed - 10/17/2021 11:36 PM      Failed - Evaluate AST, ALT within 2 months of therapy initiation.      Failed - HCT in normal range and within 180 days    HCT  Date Value Ref Range Status  02/12/2021 28.4 (L) 36.0 - 46.0 % Final   Hematocrit  Date Value Ref Range Status  02/15/2019 27.8 (L) 34.0 - 46.6 % Final          Failed - HGB in normal range and within 180 days    Hemoglobin  Date Value Ref Range Status  02/12/2021 8.8 (L) 12.0 - 15.0 g/dL Final  02/15/2019 8.9 (L) 11.1 - 15.9 g/dL Final          Failed - PLT in normal range and within 180 days    Platelets  Date Value Ref Range Status  02/12/2021 277 150 - 400 K/uL Final  02/15/2019 294 150 - 450 x10E3/uL Final          Passed - ALT in normal range and within 360 days    ALT  Date Value Ref Range Status  02/12/2021 12 0 - 44 U/L Final          Passed - AST in normal range and within 360 days    AST  Date Value Ref Range Status  02/12/2021 21 15 - 41 U/L Final          Passed - Valid encounter within last 6 months    Recent Outpatient Visits           2 months ago Poorly controlled type 2 diabetes mellitus with neuropathy ([place])   [place], [name] T, NP   8 months ago Poorly controlled type 2 diabetes mellitus with neuropathy ([place])   [place], [name] T, NP   11 months ago Poorly controlled type 2 diabetes mellitus with  neuropathy ([place])   [place], [place] T, NP   1 year ago Right-sided chest pain   [facility] [place], Megan P, DO   1 year ago Poorly controlled type 2 diabetes mellitus with neuropathy ([place])   [place], [name], NP       Future Appointments             In 1 week [place], [name], NP [employer]            Signed Prescriptions Disp Refills   omeprazole (PRILOSEC) 20 MG capsule 180 capsule 2    Sig: TAKE 1 CAPSULE BY MOUTH  TWICE DAILY     Gastroenterology: Proton Pump Inhibitors Passed - 10/17/2021 11:36 PM      Passed - Valid encounter within last 12 months    Recent Outpatient Visits           2 months ago Poorly controlled type 2 diabetes mellitus with neuropathy ([place])   [place]  [name] T, NP   8 months ago Poorly controlled type 2 diabetes mellitus with neuropathy ([place])   [place], [name] T, NP   11 months ago Poorly controlled type 2 diabetes mellitus with neuropathy ([place])   [place], [place] T, NP   1 year ago Right-sided chest pain   [place], DO   1 year ago Poorly controlled type 2 diabetes mellitus with neuropathy ([place])   [place], [name], NP       Future Appointments             In 1 week Cannady, [name], NP [employer]

## 2021-10-21 ENCOUNTER — Encounter: Payer: Medicare Other | Admitting: Speech Pathology

## 2021-10-22 ENCOUNTER — Ambulatory Visit: Payer: Medicare Other | Admitting: Speech Pathology

## 2021-10-22 ENCOUNTER — Other Ambulatory Visit: Payer: Self-pay

## 2021-10-22 DIAGNOSIS — E875 Hyperkalemia: Secondary | ICD-10-CM | POA: Diagnosis not present

## 2021-10-22 DIAGNOSIS — N184 Chronic kidney disease, stage 4 (severe): Secondary | ICD-10-CM | POA: Diagnosis not present

## 2021-10-22 DIAGNOSIS — N2581 Secondary hyperparathyroidism of renal origin: Secondary | ICD-10-CM | POA: Diagnosis not present

## 2021-10-22 DIAGNOSIS — R490 Dysphonia: Secondary | ICD-10-CM | POA: Diagnosis not present

## 2021-10-22 DIAGNOSIS — D631 Anemia in chronic kidney disease: Secondary | ICD-10-CM | POA: Diagnosis not present

## 2021-10-22 DIAGNOSIS — R809 Proteinuria, unspecified: Secondary | ICD-10-CM | POA: Diagnosis not present

## 2021-10-22 DIAGNOSIS — I129 Hypertensive chronic kidney disease with stage 1 through stage 4 chronic kidney disease, or unspecified chronic kidney disease: Secondary | ICD-10-CM | POA: Diagnosis not present

## 2021-10-22 DIAGNOSIS — E1121 Type 2 diabetes mellitus with diabetic nephropathy: Secondary | ICD-10-CM | POA: Diagnosis not present

## 2021-10-23 NOTE — Therapy (Signed)
[place] MAIN [facility] SERVICES [address] [place], [place], [address] Phone: [phone]   Fax:  [phone]  Speech Language Pathology Treatment  Patient Details  Name: Tonya [name] MRN: [id number] Date of Birth: [birth year] Referring Provider (SLP): Carloyn Manner   Encounter Date: 10/22/2021   End of Session - 10/23/21 1246     Visit Number 10    Number of Visits 17    Date for SLP Re-Evaluation 11/06/21    Authorization Type [employer] Medicare    Authorization Time Period 09/09/2021 thru 11/06/2021    Authorization - Visit Number 10    Progress Note Due on Visit 10    SLP Start Time 1500    SLP Stop Time  1600    SLP Time Calculation (min) 60 min    Activity Tolerance Patient tolerated treatment well             Past Medical History:  Diagnosis Date   (HFpEF) heart failure with preserved ejection fraction ([place])    a. 05/2017 Echo: EF 55-60%, Gr1 DD, mildly dil LA.   CKD (chronic kidney disease), stage III ([place])    Diabetes mellitus without complication ([place])    Hyperlipidemia    Hypertension    Stroke ([place])    a. 08/2017.   Wears dentures    partial upper    Past Surgical History:  Procedure Laterality Date   CATARACT EXTRACTION W/PHACO Left 02/05/2021   Procedure: CATARACT EXTRACTION PHACO AND INTRAOCULAR LENS PLACEMENT (IOC) LEFT DIABETIC 5.47 00:51.4;  Surgeon: Birder Robson, MD;  Location: [place];  Service: Ophthalmology;  Laterality: Left;  Diabetic - insulin and oral meds   CATARACT EXTRACTION W/PHACO Right 03/19/2021   Procedure: CATARACT EXTRACTION PHACO AND INTRAOCULAR LENS PLACEMENT (IOC) RIGHT DIABETIC 6.09 00:39.6;  Surgeon: Birder Robson, MD;  Location: [place];  Service: Ophthalmology;  Laterality: Right;   COLONOSCOPY  12/15/2006   COLONOSCOPY WITH PROPOFOL N/A 06/06/2019   Procedure: COLONOSCOPY WITH PROPOFOL;  Surgeon: [name], MD;  Location: [facility] ENDOSCOPY;   Service: Gastroenterology;  Laterality: N/A;   COLONOSCOPY WITH PROPOFOL N/A 01/16/2020   Procedure: COLONOSCOPY WITH BIOPSY;  Surgeon: [name], MD;  Location: [place];  Service: Endoscopy;  Laterality: N/A;  Diabetic - insulin and oral meds   POLYPECTOMY N/A 01/16/2020   Procedure: POLYPECTOMY;  Surgeon: [name], MD;  Location: [place];  Service: Endoscopy;  Laterality: N/A;   TUBAL LIGATION      There were no vitals filed for this visit.   Subjective Assessment - 10/22/21 1602     Subjective "my voice is sounding better"    Currently in Pain? No/denies                     SLP Education - 10/23/21 1246     Education Details provided    [employer]) Educated Patient    Methods Explanation;Demonstration    Comprehension Verbalized understanding;Returned demonstration              SLP Short Term Goals - 09/19/21 1100       SLP SHORT TERM GOAL #1   Title The patient will decrease laryngeal and articulatory muscle tension by independently completing relaxation/stretching exercises.    Time 10    Period --   sessions   Status New      SLP SHORT TERM GOAL #2   Title The patient will eliminate phonotraumatic behaviors such as chronic  throat clearing, by substituting non-traumatic methods to clear mucus.    Baseline habitual throat clears    Time 10    Period --   sessions   Status New      SLP SHORT TERM GOAL #3   Title The patient will increase hydration for an eventual goal of 6-8 glasses per day and limit caffeine intake (to maximum of 1-2, 8 oz cups/day), as measured by patient report.    Baseline 8 oz    Time 10    Period --   sessions   Status New              SLP Long Term Goals - 09/20/21 0801       SLP LONG TERM GOAL #1   Title The patient will increase hydration for an eventual goal of 6-8 glasses per day and limit caffeine intake (to maximum of 1-2, 8 oz cups/day), as measured by patient report.    Baseline new  goal    Time 8    Period Weeks    Status New    Target Date 11/06/21      SLP LONG TERM GOAL #2   Title The patient will demonstrate independent understanding of vocal hygiene concepts.    Baseline new goal    Time 8    Period Weeks    Target Date 11/06/22      SLP LONG TERM GOAL #3   Title The patient will eliminate phonotraumatic behaviors such as chronic throat clearing, by substituting non-traumatic methods to clear mucus.    Baseline new goal    Time 8    Period Weeks    Status New    Target Date 11/06/21      SLP LONG TERM GOAL #4   Title The patient will maximize voice quality and loudness using breath support/oral resonance for sustained vowel production, pitch glides, and hierarchal speech drill.    Baseline new goal    Time 8    Period Weeks    Status New    Target Date 11/06/22              Plan - 10/23/21 1300     Clinical Impression Statement Pt presents with improving dysphonia and as a result her vocal quality, projection, endurance and vocal hygiene are improving. Noticable progress observed during today's session. Great evidence of pt's committment to performing her HEP. Skilled ST intervention continues to be required to target vocal hygiene, relaxed phonation and increased respiratory support for improved voice and communication.    Speech Therapy Frequency 2x / week    Duration 8 weeks    Treatment/Interventions SLP instruction and feedback;Patient/family education    SLP Home Exercise Plan provided, see pt instructions section    Consulted and Agree with Plan of Care Patient             Patient will benefit from skilled therapeutic intervention in order to improve the following deficits and impairments:   Dysphonia    Problem List Patient Active Problem List   Diagnosis Date Noted   Neuropathic pain 06/07/2020   Lumbar degenerative disc disease 06/07/2020   Lumbar spondylosis 06/07/2020   Chronic pain syndrome 04/18/2020   Sacroiliac  joint pain 04/18/2020   Chronic heart failure with preserved ejection fraction (HFpEF) ([place]) 01/11/2020   Grade I diastolic dysfunction 37/85/8850   History of CVA (cerebrovascular accident) 12/01/2019   Hyperparathyroidism due to renal insufficiency ([place]) 08/31/2019   Anemia in CKD (chronic  kidney disease) 02/22/2019   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 Rutherford Nail M.S., CCC-SLP, [place] Pathologist Rehabilitation Services Office [phone]  Stormy Fabian 10/23/2021, 4:04 PM  [place] MAIN [facility] SERVICES [address] [place], [place], [address] Phone: [phone]   Fax:  [phone]   Name: KIKUE GERHART MRN: [id number] Date of Birth: [birth year]

## 2021-10-24 ENCOUNTER — Other Ambulatory Visit: Payer: Self-pay

## 2021-10-24 ENCOUNTER — Ambulatory Visit: Payer: Medicare Other | Admitting: Speech Pathology

## 2021-10-24 DIAGNOSIS — R490 Dysphonia: Secondary | ICD-10-CM | POA: Diagnosis not present

## 2021-10-24 NOTE — Therapy (Signed)
[place] MAIN [facility] SERVICES [address] [place], [place], [address] Phone: [phone]   Fax:  [phone]  Speech Language Pathology Treatment  Patient Details  Name: Tonya [name] MRN: [id number] Date of Birth: [birth year] Referring Provider (SLP): Carloyn Manner   Encounter Date: 10/24/2021   End of Session - 10/24/21 1631     Visit Number 11    Number of Visits 17    Date for SLP Re-Evaluation 11/06/21    Authorization Type [employer]    Authorization Time Period 09/09/2021 thru 11/06/2021    Authorization - Visit Number 1    Progress Note Due on Visit 10    SLP Start Time 1500    SLP Stop Time  1600    SLP Time Calculation (min) 60 min    Activity Tolerance Patient tolerated treatment well             Past Medical History:  Diagnosis Date   (HFpEF) heart failure with preserved ejection fraction ([place])    a. 05/2017 Echo: EF 55-60%, Gr1 DD, mildly dil LA.   CKD (chronic kidney disease), stage III ([place])    Diabetes mellitus without complication ([place])    Hyperlipidemia    Hypertension    Stroke ([place])    a. 08/2017.   Wears dentures    partial upper    Past Surgical History:  Procedure Laterality Date   CATARACT EXTRACTION W/PHACO Left 02/05/2021   Procedure: CATARACT EXTRACTION PHACO AND INTRAOCULAR LENS PLACEMENT (IOC) LEFT DIABETIC 5.47 00:51.4;  Surgeon: Birder Robson, MD;  Location: [place];  Service: Ophthalmology;  Laterality: Left;  Diabetic - insulin and oral meds   CATARACT EXTRACTION W/PHACO Right 03/19/2021   Procedure: CATARACT EXTRACTION PHACO AND INTRAOCULAR LENS PLACEMENT (IOC) RIGHT DIABETIC 6.09 00:39.6;  Surgeon: Birder Robson, MD;  Location: [place];  Service: Ophthalmology;  Laterality: Right;   COLONOSCOPY  12/15/2006   COLONOSCOPY WITH PROPOFOL N/A 06/06/2019   Procedure: COLONOSCOPY WITH PROPOFOL;  Surgeon: [name], MD;  Location: [facility] ENDOSCOPY;   Service: Gastroenterology;  Laterality: N/A;   COLONOSCOPY WITH PROPOFOL N/A 01/16/2020   Procedure: COLONOSCOPY WITH BIOPSY;  Surgeon: [name], MD;  Location: [place];  Service: Endoscopy;  Laterality: N/A;  Diabetic - insulin and oral meds   POLYPECTOMY N/A 01/16/2020   Procedure: POLYPECTOMY;  Surgeon: [name], MD;  Location: [place];  Service: Endoscopy;  Laterality: N/A;   TUBAL LIGATION      There were no vitals filed for this visit.   Subjective Assessment - 10/24/21 1628     Subjective "my voice can go longer now wihtout cracking"    Currently in Pain? No/denies                   ADULT SLP TREATMENT - 10/24/21 0001       Treatment Provided   Treatment provided Cognitive-Linquistic      Cognitive-Linquistic Treatment   Treatment focused on Voice;Patient/family/caregiver education    Skilled Treatment Pt was independent during today's session for relaxed phonation throughout conversational phonation. Educaiton provided on risks involved with returning to soprano singing. Will place pt on hold for 1 week and revisit ST intervention if needed after ENT follow up on 11/01/2021              SLP Education - 10/24/21 1630     Education Details risks of increasing muscle tension dysphonia when returning to soprano singing  Person(s) Educated Patient    Methods Explanation;Demonstration;Verbal cues    Comprehension Verbalized understanding;Returned demonstration              SLP Short Term Goals - 09/19/21 1100       SLP SHORT TERM GOAL #1   Title The patient will decrease laryngeal and articulatory muscle tension by independently completing relaxation/stretching exercises.    Time 10    Period --   sessions   Status New      SLP SHORT TERM GOAL #2   Title The patient will eliminate phonotraumatic behaviors such as chronic throat clearing, by substituting non-traumatic methods to clear mucus.    Baseline habitual throat  clears    Time 10    Period --   sessions   Status New      SLP SHORT TERM GOAL #3   Title The patient will increase hydration for an eventual goal of 6-8 glasses per day and limit caffeine intake (to maximum of 1-2, 8 oz cups/day), as measured by patient report.    Baseline 8 oz    Time 10    Period --   sessions   Status New              SLP Long Term Goals - 09/20/21 0801       SLP LONG TERM GOAL #1   Title The patient will increase hydration for an eventual goal of 6-8 glasses per day and limit caffeine intake (to maximum of 1-2, 8 oz cups/day), as measured by patient report.    Baseline new goal    Time 8    Period Weeks    Status New    Target Date 11/06/21      SLP LONG TERM GOAL #2   Title The patient will demonstrate independent understanding of vocal hygiene concepts.    Baseline new goal    Time 8    Period Weeks    Target Date 11/06/22      SLP LONG TERM GOAL #3   Title The patient will eliminate phonotraumatic behaviors such as chronic throat clearing, by substituting non-traumatic methods to clear mucus.    Baseline new goal    Time 8    Period Weeks    Status New    Target Date 11/06/21      SLP LONG TERM GOAL #4   Title The patient will maximize voice quality and loudness using breath support/oral resonance for sustained vowel production, pitch glides, and hierarchal speech drill.    Baseline new goal    Time 8    Period Weeks    Status New    Target Date 11/06/22              Plan - 10/24/21 1631     Clinical Impression Statement Pt presents with improving dysphonia and as a result her vocal quality, projection, endurance and vocal hygiene are improving. Noticable progress observed during today's session. Great evidence of pt's committment to performing her HEP. Skilled ST intervention continues to be required to target vocal hygiene, relaxed phonation and increased respiratory support for improved voice and communication.    Speech  Therapy Frequency --   placing pt on hold for 1 week until after ENT follow up on 11/01/2021   Treatment/Interventions SLP instruction and feedback;Patient/family education    Potential to Achieve Goals Good    Consulted and Agree with Plan of Care Patient  Patient will benefit from skilled therapeutic intervention in order to improve the following deficits and impairments:   Dysphonia    Problem List Patient Active Problem List   Diagnosis Date Noted   Neuropathic pain 06/07/2020   Lumbar degenerative disc disease 06/07/2020   Lumbar spondylosis 06/07/2020   Chronic pain syndrome 04/18/2020   Sacroiliac joint pain 04/18/2020   Chronic heart failure with preserved ejection fraction (HFpEF) ([place]) 01/11/2020   Grade I diastolic dysfunction 01/74/9449   History of CVA (cerebrovascular accident) 12/01/2019   Hyperparathyroidism due to renal insufficiency ([place]) 08/31/2019   Anemia in CKD (chronic kidney disease) 02/22/2019   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 Rutherford Nail M.S., CCC-SLP, [place] Pathologist Rehabilitation Services Office [phone]  Stormy Fabian 10/24/2021, 4:33 PM  [place] MAIN [facility] SERVICES [address] [place], [place], [address] Phone: [phone]   Fax:  [phone]   Name: DYNASTI KERMAN MRN: [id number] Date of Birth: Jun 27, 1946

## 2021-10-25 ENCOUNTER — Encounter: Payer: Medicare Other | Admitting: Speech Pathology

## 2021-10-29 ENCOUNTER — Ambulatory Visit (INDEPENDENT_AMBULATORY_CARE_PROVIDER_SITE_OTHER): Payer: Medicare Other | Admitting: Nurse Practitioner

## 2021-10-29 ENCOUNTER — Ambulatory Visit (INDEPENDENT_AMBULATORY_CARE_PROVIDER_SITE_OTHER): Payer: Medicare Other

## 2021-10-29 ENCOUNTER — Telehealth: Payer: Medicare Other

## 2021-10-29 ENCOUNTER — Other Ambulatory Visit: Payer: Self-pay

## 2021-10-29 ENCOUNTER — Encounter: Payer: Self-pay | Admitting: Nurse Practitioner

## 2021-10-29 VITALS — BP 138/76 | HR 71 | Temp 98.1°F | Ht 66.0 in | Wt 170.0 lb

## 2021-10-29 DIAGNOSIS — D631 Anemia in chronic kidney disease: Secondary | ICD-10-CM

## 2021-10-29 DIAGNOSIS — N184 Chronic kidney disease, stage 4 (severe): Secondary | ICD-10-CM | POA: Diagnosis not present

## 2021-10-29 DIAGNOSIS — N2581 Secondary hyperparathyroidism of renal origin: Secondary | ICD-10-CM

## 2021-10-29 DIAGNOSIS — G894 Chronic pain syndrome: Secondary | ICD-10-CM | POA: Diagnosis not present

## 2021-10-29 DIAGNOSIS — I10 Essential (primary) hypertension: Secondary | ICD-10-CM

## 2021-10-29 DIAGNOSIS — Z23 Encounter for immunization: Secondary | ICD-10-CM | POA: Diagnosis not present

## 2021-10-29 DIAGNOSIS — E1169 Type 2 diabetes mellitus with other specified complication: Secondary | ICD-10-CM

## 2021-10-29 DIAGNOSIS — I5032 Chronic diastolic (congestive) heart failure: Secondary | ICD-10-CM | POA: Diagnosis not present

## 2021-10-29 DIAGNOSIS — Z794 Long term (current) use of insulin: Secondary | ICD-10-CM | POA: Diagnosis not present

## 2021-10-29 DIAGNOSIS — E1140 Type 2 diabetes mellitus with diabetic neuropathy, unspecified: Secondary | ICD-10-CM

## 2021-10-29 DIAGNOSIS — E1165 Type 2 diabetes mellitus with hyperglycemia: Secondary | ICD-10-CM

## 2021-10-29 DIAGNOSIS — M792 Neuralgia and neuritis, unspecified: Secondary | ICD-10-CM

## 2021-10-29 DIAGNOSIS — E785 Hyperlipidemia, unspecified: Secondary | ICD-10-CM | POA: Diagnosis not present

## 2021-10-29 DIAGNOSIS — I130 Hypertensive heart and chronic kidney disease with heart failure and stage 1 through stage 4 chronic kidney disease, or unspecified chronic kidney disease: Secondary | ICD-10-CM | POA: Diagnosis not present

## 2021-10-29 DIAGNOSIS — B0229 Other postherpetic nervous system involvement: Secondary | ICD-10-CM

## 2021-10-29 NOTE — Chronic Care Management (AMB) (Signed)
Chronic Care Management   CCM RN Visit Note  10/29/2021 Name: Tonya [name] MRN: [id number] DOB: December 22, 1945  Subjective: Tonya [name] is a 75 y.o. year old female who is a primary care patient of Cannady, [name], NP. The care management team was consulted for assistance with disease management and care coordination needs.    Engaged with patient face to face for follow up visit in response to provider referral for case management and/or care coordination services.   Consent to Services:  The patient was given information about Chronic Care Management services, agreed to services, and gave verbal consent prior to initiation of services.  Please see initial visit note for detailed documentation.   Patient agreed to services and verbal consent obtained.   Assessment: Review of patient past medical history, allergies, medications, health status, including review of consultants reports, laboratory and other test data, was performed as part of comprehensive evaluation and provision of chronic care management services.   SDOH (Social Determinants of Health) assessments and interventions performed:    CCM Care Plan  Allergies  Allergen Reactions   Atorvastatin     Myalgias    Gabapentin Other (See Comments)    Speech impairment    Pravastatin     Myalgias    Pregabalin     Speech impairment   Trulicity [Dulaglutide] Hives    Outpatient Encounter Medications as of 10/29/2021  Medication Sig   aspirin 81 MG tablet Take 81 mg by mouth daily.   carvedilol (COREG) 3.125 MG tablet TAKE 1 TABLET BY MOUTH  TWICE DAILY WITH A MEAL   clopidogrel (PLAVIX) 75 MG tablet TAKE 1 TABLET BY MOUTH  DAILY   docusate sodium (COLACE) 100 MG capsule Take 100 mg by mouth daily as needed for mild constipation.   ferrous sulfate 325 (65 FE) MG tablet Take 325 mg by mouth daily with breakfast.   HUMALOG KWIKPEN 100 UNIT/ML KiwkPen 6 Units. Take 6 units before breakfast, lunch, supper if glucose  greater than 70   Insulin Glargine (BASAGLAR KWIKPEN [place]) Inject 8 Units into the skin daily.   NOVOFINE 32G X 6 MM MISC USE AS DIRECTED THREE TIMES DAILY WITH HUMALOG   olmesartan (BENICAR) 40 MG tablet Take 1 tablet (40 mg total) by mouth daily.   omeprazole (PRILOSEC) 20 MG capsule TAKE 1 CAPSULE BY MOUTH  TWICE DAILY   pioglitazone (ACTOS) 30 MG tablet Take 30 mg by mouth daily.   rosuvastatin (CRESTOR) 5 MG tablet TAKE 1 TABLET BY MOUTH  DAILY   Torsemide 40 MG TABS Take by mouth.   vitamin B-12 (CYANOCOBALAMIN) 100 MCG tablet Take 100 mcg by mouth daily.   Vitamin D, Cholecalciferol, 50 MCG (2000 UT) CAPS Take 2,000 Units by mouth daily.   No facility-administered encounter medications on file as of 10/29/2021.    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[place]) 09/22/2017   Advanced care planning/counseling discussion 04/13/2017   Post herpetic neuralgia 10/10/2016   Hip bursitis  09/18/2015   Poorly controlled type 2 diabetes mellitus with neuropathy ([place]) 05/01/2015   Hyperlipidemia associated with type 2 diabetes mellitus ([place]) 05/01/2015   Chronic kidney disease, stage 4, severely decreased GFR ([place]) 05/01/2015   Encounter for long-term (current) use of insulin ([place]) 05/01/2015    Conditions to be addressed/monitored:CHF, HTN, HLD, DMII, Osteoarthritis, and Chronic Pain   Care Plan : RNCM: Heart Failure (Adult)  Updates made by Vanita Ingles, RN since 10/29/2021 12:00 AM  Completed 10/29/2021   Problem: RNCM: Symptom Exacerbation (Heart Failure) Resolved 10/29/2021  Priority: Medium     Long-Range Goal: RNCM: Symptom Exacerbation Prevented or Minimized Completed 10/29/2021  Start Date: 02/15/2021  Expected End Date: 06/03/2022  Recent Progress: On track  Priority: Medium  Note:   Current Barriers: Resolving, duplicate goal  Knowledge deficits related to basic heart failure pathophysiology and self care management Lacks social connections Does not contact provider office for questions/concerns Lack of scale in home Financial strain Nurse Case Manager Clinical Goal(s):  patient will weigh self daily and record patient will verbalize understanding of Heart Failure Action Plan and when to call doctor patient will take all Heart Failure mediations as prescribed Interventions:  Collaboration with [name], NP regarding development and update of comprehensive plan of care as evidenced by provider attestation and co-signature Inter-disciplinary care team collaboration (see longitudinal plan of care) Basic overview and discussion of pathophysiology of Heart Failure Provided written and verbal education on low sodium diet. 07-23-2021: Endorses compliance with heart healthy/ADA diet  Reviewed Heart Failure Action Plan in depth and provided written copy Assessed for scales in home- has scales Discussed importance of daily weight Reviewed role of diuretics in prevention of fluid overload Evaluation of HF. 09-24-2021: The patient states she is doing well. Denies any exacerbations of HF at this time. Is compliant with the poc, medications and dietary restrictions. Will continue to monitor.  Patient Goals/Self-Care Activities:  Takes Heart Failure Medications as prescribed. 09-24-2021: Compliant with medications  Weighs daily and record (notifying MD of 3 lb weight gain over night or 5 lb in a week) Verbalizes  understanding of and follows CHF Action Plan Adheres to low sodium diet  - Take Heart Failure Medications as prescribed - Weigh daily and record (notify MD with 3 lb weight gain over night or 5 lb in a week) - Follow CHF Action Plan - Adhere to low sodium diet - barriers to lifestyle changes reviewed and addressed - barriers to treatment reviewed and addressed - cognitive screening completed and reviewed - depression screen reviewed - health literacy screening completed or reviewed - healthy lifestyle promoted - medication-adherence assessment completed - rescue (action) plan developed - rescue (action) plan reviewed - self-awareness of signs/symptoms of worsening disease encouraged Follow Up Plan: Telephone follow up appointment with care management team member scheduled for: 11-26-2021 at 145pm    Care Plan : RNCM: Chronic Pain (Adult)  Updates made by Vanita Ingles, RN since 10/29/2021 12:00 AM  Completed 10/29/2021   Problem: RNCM: Pain Management Plan (Chronic Pain) Resolved 10/29/2021  Priority: High  Onset Date: 02/12/2021     Long-Range Goal: RNCM: Pain Management Plan Developed Completed 10/29/2021  Start Date: 02/12/2021  Expected End Date: 07/03/2022  Recent Progress: On track  Priority: High  Note:   Current Barriers: Resolving, duplicate goal  Knowledge Deficits related to managing acute/chronic pain Non-adherence to scheduled provider appointments Non-adherence to prescribed medication regimen Difficulty obtaining medications Chronic Disease Management support and education  needs related to chronic pain Unable to independently manage pain and discomfort to lower back radiating down left leg Lacks social connections Unable to perform IADLs independently Does not contact provider office for questions/concerns Nurse Case Manager Clinical Goal(s):  patient will verbalize understanding of plan for managing pain patient will attend all scheduled medical appointments:  10-29-2021 at 1020 am patient will demonstrate use of different relaxation  skills and/or diversional activities to assist with pain reduction (distraction, imagery, relaxation, massage, acupressure, TENS, heat, and cold application patient will report pain at a level less than 3 to 4 on a 10-10 rating scale patient will use pharmacological and nonpharmacological pain relief strategies patient will verbalize acceptable level of pain relief and ability to engage in desired activities patient will engage in desired activities without an increase in pain level Interventions:  Collaboration with [name] T, NP regarding development and update of comprehensive plan of care as evidenced by provider attestation and co-signature Inter-disciplinary care team collaboration (see longitudinal plan of care) - careful application of heat or ice encouraged - deep breathing, relaxation and mindfulness use promoted - effectiveness of pharmacologic therapy monitored - medication-induced side effects managed - misuse of pain medication assessed - motivation and barriers to change assessed and addressed - mutually acceptable comfort goal set - pain assessed. 04-05-2021: The patient states her pain level is less than a 3. Procedure has worked well for her. She is having tingling in her legs but will discuss with the specialist at upcoming appointment on 04-09-2021. 07-23-2021: The patient denies any tingling in her legs. That has resolved. She is still having issues with back pain and discomfort. It bothers her the most when she is in the kitchen cooking or washing dishes. Discussed pacing activity. She states she rest well at night if she takes a Tylenol Arthritis for pain management. Encouraged the patient to discuss options with the pcp at upcoming appointment on 07-29-2021. 09-24-2021: The patient states she had an injection on 07-16-2021 and it was not effective for long. She is supposed to hear from the pain  specialist on 10-07-2021 and will discuss further options. Rates her pain level at a 1 today but currently was sitting down and not moving around. She states that is was "calm". Would like to have something that works for it. She does take Tylenol arthritis when needed for pain. States the pain does wake her from sleep if she turns over a certain way. Education and support given.  - pain treatment goals reviewed. 04-05-2021: The patient states she is feeling better. She is thankful the procedure has been helpful. Will see the specialist next week. 07-23-2021: Would like to know recommendations for effective management of back pain and discomfort. 09-24-2021: Is working with the pain specialist. Will discuss further options for relief of chronic back pain on 10-07-2021.  - premedication prior to activity encouraged Evaluation of current treatment plan related to pain in back going down left leg and patient's adherence to plan as established by provider. 07-23-2021: Pain in legs with tingling resolved. The patient still having issues with back pain. Sees pcp next week. Will collaborate with the pcp for recommendations for the patient with effective management of her back pain. 09-24-2021: Is working with the specialist to help with a plan of care for chronic back pain and discomfort.  Advised patient to call the office for worsening sx/sx of pain or changes noted  Provided education to patient re: alternative pain relief measures. The patient tried heat  application last night but states it was not helpful. States the pain is going down into her left leg from her back. 09-24-2021: States her pain is "calm" today and rates at a 1.  Reviewed medications with patient and discussed compliance. The patient is taking a prednisone dose pack and Robaxin. She feels the Robaxin or the prednisone is not helping. 09-24-2021: The patient is taking Tylenol arthritis for back pain and this is effective in helping her rest at night.   Collaborated with pcp regarding the patient states she can not see the pain specialist until 03-05-2021, ask about getting a "shot", will discuss with pcp for recommendations. 04-05-2021: Had procedure and is doing well post procedure. Will continue to monitor. 07-23-2021: Follow up with the pcp on 07-29-2021, the patient will discuss her pain concerns with the pcp. 09-24-2021: Will follow up with the pain specialist on 10-07-2021 and with the pcp on 10-29-2021. Discussed plans with patient for ongoing care management follow up and provided patient with direct contact information for care management team Allow patient to maintain a diary of pain ratings, timing, precipitating events, medications, treatments, and what works best to relieve pain,  Refer to support groups and self-help groups Educate patient about the use of pharmacological interventions for pain management- antianxiety, antidepressants, NSAIDS, opioid analgesics,  Explain the importance of lifestyle modifications to effective pain management  Patient Goals/Self Care Activities:  - mutually acceptable comfort goal set - pain assessed - pain management plan developed - pain treatment goals reviewed - patient response to treatment assessed - sharing of pain management plan with teachers and other caregivers encouraged Self-administers medications as prescribed Attends all scheduled provider appointments Calls pharmacy for medication refills Calls provider office for new concerns or questions Follow Up Plan: Telephone follow up appointment with care management team member scheduled for: 11-26-2021 at 145 pm      Care Plan : RNCM: Hypertension (Adult)  Updates made by Vanita Ingles, RN since 10/29/2021 12:00 AM  Completed 10/29/2021   Problem: RNCM: Hypertension (Hypertension) Resolved 10/29/2021  Priority: Medium     Long-Range Goal: RNCM: Hypertension Monitored Completed 10/29/2021  Start Date: 02/15/2021  Expected End Date:  06/13/2022  Recent Progress: On track  Priority: Medium  Note:   Objective: Resolving, duplicate goal  Last practice recorded BP readings:  BP Readings from Last 3 Encounters:  09/02/21 (!) 151/82  08/08/21 (!) 167/89  07/29/21 140/82     Most recent eGFR/CrCl: No results found for: EGFR  No components found for: CRCL Current Barriers:  Knowledge Deficits related to basic understanding of hypertension pathophysiology and self care management Knowledge Deficits related to understanding of medications prescribed for management of hypertension Limited Social Support Lacks social connections Unable to perform IADLs independently Does not contact provider office for questions/concerns Case Manager Clinical Goal(s):   patient will verbalize understanding of plan for hypertension management patient will attend all scheduled medical appointments: 10-29-2021 patient will demonstrate improved adherence to prescribed treatment plan for hypertension as evidenced by taking all medications as prescribed, monitoring and recording blood pressure as directed, adhering to low sodium/DASH diet  patient will demonstrate improved health management independence as evidenced by checking blood pressure as directed and notifying PCP if SBP>160 or DBP > 90, taking all medications as prescribe, and adhering to a low sodium diet as discussed.  patient will verbalize basic understanding of hypertension disease process and self health management plan as evidenced by compliance with medications, compliance with diet, and working with  CCM team to manage health and well being  Interventions:  Collaboration with [name], NP regarding development and update of comprehensive plan of care as evidenced by provider attestation and co-signature Inter-disciplinary care team collaboration (see longitudinal plan of care) Evaluation of current treatment plan related to hypertension self management and patient's adherence  to plan as established by provider. 07-23-2021: The patient is stable and has no new concerns with her blood pressure. Adheres to heart healthy diet and denies any issues with medications compliance. Will continue to monitor. 09-24-2021: The patient has been having elevations in blood pressures recently. States the reason for the 167/89 was she was having injections and she was nervous. Education and support given.  Provided education to patient re: stroke prevention, s/s of heart attack and stroke, DASH diet, complications of uncontrolled blood pressure Reviewed medications with patient and discussed importance of compliance. 09-24-2021: The patient verbalized she is compliant with medications.  Discussed plans with patient for ongoing care management follow up and provided patient with direct contact information for care management team Advised patient, providing education and rationale, to monitor blood pressure daily and record, calling PCP for findings outside established parameters.  Reviewed scheduled/upcoming provider appointments including: 10-29-2021 Patient Goals: - blood pressure trends reviewed - depression screen reviewed - home or ambulatory blood pressure monitoring encouraged Self-Care Activities: - Self administers medications as prescribed Attends all scheduled provider appointments Calls provider office for new concerns, questions, or BP outside discussed parameters Checks BP and records as discussed Follows a low sodium diet/DASH diet Follow Up Plan: Telephone follow up appointment with care management team member scheduled for:  11-26-2021 at 145 pm    Care Plan : RNCM: Diabetes Type 2 (Adult)  Updates made by Vanita Ingles, RN since 10/29/2021 12:00 AM  Completed 10/29/2021   Problem: RNCM: Glycemic Management (Diabetes, Type 2) Resolved 10/29/2021  Priority: High     Long-Range Goal: RNCM: Glycemic Management Optimized Completed 10/29/2021  Start Date: 02/15/2021   Expected End Date: 06/06/2022  Recent Progress: Not on track  Priority: High  Note:   Objective: Resolving, duplicate goal  Lab Results  Component Value Date   HGBA1C 8.2 (H) 01/29/2021   05-08-2021 A1C 8.1, 08-30-2021 9.1 Lab Results  Component Value Date   CREATININE 2.45 (H) 02/12/2021   CREATININE 2.60 (H) 01/29/2021   CREATININE 2.56 (H) 10/25/2020   No results found for: EGFR Current Barriers:  Knowledge Deficits related to medications used for management of diabetes Does not use cbg meter  Limited Social Support Unable to independently manage blood sugars Unable to self administer medications as prescribed Does not adhere to provider recommendations re: checking blood sugars as recommended by the provider  Does not adhere to prescribed medication regimen Lacks social connections Case Manager Clinical Goal(s):  patient will demonstrate improved adherence to prescribed treatment plan for diabetes self care/management as evidenced by: daily monitoring and recording of CBG  adherence to ADA/ carb modified diet exercise 3/4 days/week adherence to prescribed medication regimen contacting provider for new or worsened symptoms or questions Interventions:  Collaboration with [name], NP regarding development and update of comprehensive plan of care as evidenced by provider attestation and co-signature Inter-disciplinary care team collaboration (see longitudinal plan of care) Provided education to patient about basic DM disease process Reviewed medications with patient and discussed importance of medication adherence. 09-24-2021: The patient is compliant with medications. Has had adjustments recently Discussed plans with patient for ongoing care management follow up  and provided patient with direct contact information for care management team Provided patient with written educational materials related to hypo and hyperglycemia and importance of correct treatment.07-23-2021: The  patient states that after taking her "shot" sometimes her blood sugars have been dropping "low". She states 63 and sometimes lower.  She knows what to do and usually eats something, drinks something or takes glucose tablets. She also keeps tablets by her bed.  She states this is happening on a consistent basis. Education provided. Will collaborate with pcp on information provided by the patient. The patient has an appointment on 07-29-2021.  The patient does desire to talk to her about her blood sugars, back pain, and she is having issues with being hoarse after talking. She states it is getting worse and she can no longer sing. Will send in basket message letting the pcp know of patients stated concerns. Education and support provided. 09-24-2021: The patient states she still has to watch after taking her shot. She had an episode where she was at church and her blood sugar dropped. She did not check it but was diaphoretic and had to eat something to help it go back up. Discussed caring jelly beans to help when her blood sugars drop.  Reviewed scheduled/upcoming provider appointments including: 07-29-2021 at 11 am Advised patient, providing education and rationale, to check cbg bid and record, calling pcp for findings outside established parameters.  07-23-2021: The patient has been checking more frequently. The patients range is 63 to 140. This am her blood sugar was 113. 09-24-2021: the patient is using the freestyle libre now. Her average blood sugar is 163. Education on the goal of fasting <130 and post prandial of <180. Will continue to monitor.  Review of patient status, including review of consultants reports, relevant laboratory and other test results, and medications completed. Self-Care Activities - UNABLE to independently manage DM Self administers oral medications as prescribed Self administers insulin as prescribed Attends all scheduled provider appointments Checks blood sugars as prescribed and  utilize hyper and hypoglycemia protocol as needed Adheres to prescribed ADA/carb modified Patient Goals: -- barriers to adherence to treatment plan identified - blood glucose monitoring encouraged - blood glucose readings reviewed - encourage participation in diabetes education classes - individualized medical nutrition therapy provided - resources required to improve adherence to care identified - self-awareness of signs/symptoms of hypo or hyperglycemia encouraged - use of blood glucose monitoring log promoted Follow Up Plan: Telephone follow up appointment with care management team member scheduled for: 11-26-2021 at 145 pm    Care Plan : [facility]; General Plan of Care (Adult) for Chronic Disease Management and Care Coordination Needs  Updates made by Vanita Ingles, RN since 10/29/2021 12:00 AM     Problem: RNCM: Development for Plan of Care for Chronic Disease Management (DM, HTN, HLD, HF, Chronic Pain)   Priority: High     Long-Range Goal: RNCM: Effective Management for Plan of Care for Chronic Disease Management (DM, HTN, HLD, HF, Chronic Pain)   Start Date: 10/29/2021  Expected End Date: 10/29/2022  Priority: High  Note:   Current Barriers:  Knowledge Deficits related to plan of care for management of CHF, HTN, HLD, DMII, and Chronic Pain   Chronic Disease Management support and education needs related to CHF, HTN, HLD, DMII, and chronic pain   RNCM Clinical Goal(s):  Patient will verbalize understanding of plan for management of CHF, HTN, HLD, DMII, and chronic pain  as evidenced by seeing providers on a  regular basis, taking medications as ordered, following plan of care and calling offie for changes take all medications exactly as prescribed and will call provider for medication related questions as evidenced by compliance and calling the pharmacy before running out of medications     attend all scheduled medical appointments: seeing pcp today, has specialist appointments  coming up as evidenced by keeping appointments and calling the office for changes that need to be made if unable to make appointment         demonstrate improved and ongoing health management independence as evidenced by working with the CCM team to optimize health and well being        demonstrate a decrease in CHF, HTN, HLD, DMII, and Chronic pain  exacerbations  as evidenced by following plan of care, working with the CCM team to effectively manage health and well being  demonstrate ongoing self health care management ability for effective management of chronic conditions  as evidenced by  working with the CCM team through collaboration with Consulting civil engineer, provider, and care team.   Interventions: 1:1 collaboration with primary care provider regarding development and update of comprehensive plan of care as evidenced by provider attestation and co-signature Inter-disciplinary care team collaboration (see longitudinal plan of care) Evaluation of current treatment plan related to  self management and patient's adherence to plan as established by provider   SDOH Barriers (Status: Goal on Track (progressing): YES.) Long Term Goal  Patient interviewed and SDOH assessment performed        Patient interviewed and appropriate assessments performed Provided patient with information about resources available and to call the office for changes in SDOH or new needs  Discussed plans with patient for ongoing care management follow up and provided patient with direct contact information for care management team Advised patient to call the office for changes in SDOH, questions, or concerns    Heart Failure Interventions:  (Status: Goal on Track (progressing): YES.)  Long Term Goal  Basic overview and discussion of pathophysiology of Heart Failure reviewed Provided education on low sodium diet Reviewed Heart Failure Action Plan in depth and provided written copy Assessed need for readable accurate scales  in home Provided education about placing scale on hard, flat surface Advised patient to weigh each morning after emptying bladder Discussed importance of daily weight and advised patient to weigh and record daily Reviewed role of diuretics in prevention of fluid overload and management of heart failure Discussed the importance of keeping all appointments with provider Provided patient with education about the role of exercise in the management of heart failure Advised patient to discuss HF and heart health with provider Screening for signs and symptoms of depression related to chronic disease state  Assessed social determinant of health barriers Evaluation of edema and wearing compression stockings. The patient states she is not using these. Encouraged by the pcp to start wearing. RNCM recommended trying the [employer] compression socks for a different alternative to the compression hose. Wrote down the information for the patient and where to look into getting these socks. Encouraged the patient that this would also help with her neuropathy.   Diabetes:  (Status: Goal on Track (progressing): YES.) Long Term Goal   Lab Results  Component Value Date   HGBA1C 8.2 (H) 01/29/2021  08-30-2021: 9.1 at [place] patient's understanding of A1c goal: <7% Provided education to patient about basic DM disease process; Reviewed medications with patient and discussed importance of medication adherence;  Reviewed prescribed diet with patient heart healthy/ADA; Counseled on importance of regular laboratory monitoring as prescribed;        Discussed plans with patient for ongoing care management follow up and provided patient with direct contact information for care management team;      Provided patient with written educational materials related to hypo and hyperglycemia and importance of correct treatment. 10-29-2021: The patient had a low of 40 and 55 on 10-21-2021: Has had 7 lows  over the last 90 days. Education on keeping gel with her in her pocket book and at home for lows. The patient does eat peanut butter and drinks something that will help bring it back up when she is at home. Education and support given.        Reviewed scheduled/upcoming provider appointments including: seeing pcp today, has upcoming specialist appointments ;         Advised patient, providing education and rationale, to check cbg as directed, the patient has the freestyle [place]  and record . 10-29-2021: Average has been 186. The patients range has been 40 on 11-7 to 273.  Extensive education from the pcp today to discuss restarting long acting insulin.  The patient verbalized understanding       call provider for findings outside established parameters;       Review of patient status, including review of consultants reports, relevant laboratory and other test results, and medications completed;       Advised patient to discuss medications  with provider.  The patient has not been taking her long acting insulin but has her short acting and is experiencing some lows. Education and support given      Evaluation of neuropathy pain. The patient sees Dr. Holley Raring as she cannot tolerate gabapentin or lyricia. Cymbalta did not offer any relief either. Pcp is going to discuss capzasin cream wraps to toes with Dr. Holley Raring as this is something he is trying in the office now to help with pain relief from neuropathy.   Hyperlipidemia:  (Status: Goal on Track (progressing): YES.) Long Term Goal  Lab Results  Component Value Date   CHOL 183 01/29/2021   HDL 80 01/29/2021   LDLCALC 88 01/29/2021   LDLDIRECT 67 12/30/2018   TRIG 81 01/29/2021   CHOLHDL 2.5 12/02/2019  *Having repeat lab work in the office today 10-29-2021   Medication review performed; medication list updated in electronic medical record.  Provider established cholesterol goals reviewed; Counseled on importance of regular laboratory monitoring as  prescribed; Provided HLD educational materials; Reviewed role and benefits of statin for ASCVD risk reduction; Discussed strategies to manage statin-induced myalgias; Reviewed importance of limiting foods high in cholesterol;  Hypertension: (Status: Goal on Track (progressing): YES.) Last practice recorded BP readings:  BP Readings from Last 3 Encounters:  10/29/21 (!) 145/85  09/02/21 (!) 151/82  08/08/21 (!) 167/89  Most recent eGFR/CrCl: No results found for: EGFR  No components found for: CRCL  Evaluation of current treatment plan related to hypertension self management and patient's adherence to plan as established by provider;   Provided education to patient re: stroke prevention, s/s of heart attack and stroke; Reviewed prescribed diet heart healthy/ADA diet  Reviewed medications with patient and discussed importance of compliance;  Counseled on adverse effects of illicit drug and excessive alcohol use in patients with high blood pressure;  Discussed plans with patient for ongoing care management follow up and provided patient with direct contact information for care management team; Advised patient,  providing education and rationale, to monitor blood pressure daily and record, calling PCP for findings outside established parameters;  Advised patient to discuss blood pressure trends  with provider; Provided education on prescribed diet heart healthy/ADA diet ;  Discussed complications of poorly controlled blood pressure such as heart disease, stroke, circulatory complications, vision complications, kidney impairment, sexual dysfunction;   Pain:  (Status: Goal on Track (progressing): YES.) Long Term Goal  Pain assessment performed Medications reviewed Reviewed provider established plan for pain management; Discussed importance of adherence to all scheduled medical appointments; Counseled on the importance of reporting any/all new or changed pain symptoms or management strategies  to pain management provider; Advised patient to report to care team affect of pain on daily activities; Discussed use of relaxation techniques and/or diversional activities to assist with pain reduction (distraction, imagery, relaxation, massage, acupressure, TENS, heat, and cold application. 10-29-2021: The patient uses CBD oil and uses a spray to help with relaxing her and that has been effective in helping her rest. The pain is were she had shingles and also OA. The patient had an injection from the pain clinic but only was effective for a short period of time. The patient states that she is also have neuropathy pain in toes. The patient cannot tolerate gabapentin or Lyrica. The pcp to collaborate with pain provider. Discussed capzasin cream wraps for toes to see if that would help with neuropathy pain. The patient is open to recommendations  Reviewed with patient prescribed pharmacological and nonpharmacological pain relief strategies; Advised patient to discuss unresolved pain, changes in intensity or level of pain  with provider;   Patient Goals/Self-Care Activities: Patient will self administer medications as prescribed as evidenced by self report/primary caregiver report  Patient will attend all scheduled provider appointments as evidenced by clinician review of documented attendance to scheduled appointments and patient/caregiver report Patient will call pharmacy for medication refills as evidenced by patient report and review of pharmacy fill history as appropriate Patient will attend church or other social activities as evidenced by patient report Patient will continue to perform ADL's independently as evidenced by patient/caregiver report Patient will continue to perform IADL's independently as evidenced by patient/caregiver report Patient will call provider office for new concerns or questions as evidenced by review of documented incoming telephone call notes and patient report Patient  will work with BSW to address care coordination needs and will continue to work with the clinical team to address health care and disease management related needs as evidenced by documented adherence to scheduled care management/care coordination appointments call office if I gain more than 2 pounds in one day or 5 pounds in one week keep legs up while sitting track weight in diary use salt in moderation watch for swelling in feet, ankles and legs every day weigh myself daily develop a rescue plan follow rescue plan if symptoms flare-up eat more whole grains, fruits and vegetables, lean meats and healthy fats know when to call the doctorfor changes in HF, questions, or concerns track symptoms and what helps feel better or worse dress right for the weather, hot or cold schedule appointment with eye doctor check blood sugar at prescribed times: when you have symptoms of low or high blood sugar, before and after exercise, and has freestyle libre and can check blood sugars frequently  check feet daily for cuts, sores or redness enter blood sugar readings and medication or insulin into daily log take the blood sugar meter to all doctor visits trim toenails straight  across drink 6 to 8 glasses of water each day eat fish at least once per week fill half of plate with vegetables limit fast food meals to no more than 1 per week manage portion size prepare main meal at home 3 to 5 days each week read food labels for fat, fiber, carbohydrates and portion size reduce red meat to 2 to 3 times a week keep feet up while sitting wash and dry feet carefully every day wear comfortable, cotton socks wear comfortable, well-fitting shoes - check blood pressure daily - choose a place to take my blood pressure (home, clinic or office, retail store) - write blood pressure results in a log or diary - learn about high blood pressure - keep a blood pressure log - take blood pressure log to all doctor  appointments - call doctor for signs and symptoms of high blood pressure - develop an action plan for high blood pressure - keep all doctor appointments - take medications for blood pressure exactly as prescribed - report new symptoms to your doctor - eat more whole grains, fruits and vegetables, lean meats and healthy fats - call for medicine refill 2 or 3 days before it runs out - take all medications exactly as prescribed - call doctor with any symptoms you believe are related to your medicine - call doctor when you experience any new symptoms - go to all doctor appointments as scheduled - adhere to prescribed diet: heart healthy/ADA diet        Plan:Telephone follow up appointment with care management team member scheduled for:  11-26-2021 at [place] am  [name] RN, MSN, [facility] Mobile: [phone]

## 2021-10-29 NOTE — Assessment & Plan Note (Signed)
Chronic, ongoing.  Continue Rosuvastatin. Continue to collaborate with [facility].   Lipid panel today. 

## 2021-10-29 NOTE — Progress Notes (Signed)
BP 138/76 (BP Location: Left Arm, Patient Position: Sitting)   Pulse 71   Temp 98.1 F (36.7 C) (Oral)   Ht $R'5\' 6"'QZ$  (1.676 m)   Wt 170 lb (77.1 kg)   LMP  (LMP Unknown)   SpO2 100%   BMI 27.44 kg/m    Subjective:    Patient ID: Tonya [name], female    DOB: 1946-05-09, 75 y.o.   MRN: [id number]  HPI: Tonya [name] is a 75 y.o. female  Chief Complaint  Patient presents with   Diabetes    Follow up for diabetes   DIABETES Last saw Dr. [name]  with endo on 08/30/21 with A1C 9.1%.    Cardiology and PCP have recommended to endo discontinuation of Actos due to her HF, she reports this medication continues and remains on list.  Does endorse she has been eating differently and focused more on diet.  Previously has tried Toujeo and Trulicity -- did not tolerate.  Currently taking Humalog and Actos -- she self stopped Basaglar == discussed at length with her today the difference between long and short acting insulin.   Freestyle in place.  Average glucose over past 90 days has been 186, have been above 61% of the time, at goal 28%, and low 11% of time.  No lows in past 7 days -- in past 90 days has had 7 lows -- has glucose gel at home to use as needed. Polydipsia/polyuria: no Visual disturbance: no Chest pain: no Paresthesias: no Glucose Monitoring: yes             Accucheck frequency: TID as above Freestyle over past 90 days             Fasting glucose:              Post prandial:             Evening: as above             Before meals:              Taking Insulin?: yes             Long acting insulin: None, she stopped taking             Short acting insulin: Humalog 10 units in morning and then 8 units at lunch and dinner Blood Pressure Monitoring: a few times a week Retinal Examination: Up to Date -- 06/01/20 Foot Exam: Up to Date Pneumovax: Up to Date Influenza: Up to Date Aspirin: yes    HYPERTENSION / HYPERLIPIDEMIA/HF Sees cardiology, Dr. [name] Situ, with last visit  04/19/21 with no changes made.  Continues on Carvedilol, Torsemide, and Crestor.  Her LVEF on 12/02/2019 was 60 to 65% with moderate LVH, and grade 1 diastolic dysfunction.  Is not wearing compression hose as recommended at home. Satisfied with current treatment? yes Duration of hypertension: chronic BP monitoring frequency: a few times a week BP range: 130/80 range at home BP medication side effects: no Duration of hyperlipidemia: chronic Cholesterol medication side effects: no Cholesterol supplements: none Medication compliance: good compliance Aspirin: yes Recent stressors: no Recurrent headaches: no Visual changes: no Palpitations: no Dyspnea: no Chest pain: no Lower extremity edema: at baseline, small amount Dizzy/lightheaded: no    CHRONIC KIDNEY DISEASE Followed by nephrology and last saw 10/22/21 -- CRT 2.52, eGFR 20, BUN 50, PTH 132.  Is followed by hematology for anemia in CKD and last seen 12/27/2019.  Recent H/H with nephrology 8.7/28.5 ==  has been to hematology in past, but not recently. CKD status: stable Medications renally dose: yes Previous renal evaluation: yes Pneumovax:  Up to Date Influenza Vaccine:  Up to Date    POSTHERPETIC NEURALGIA: Seen by pain management and last seen by Dr. Holley Raring on 08/30/21 -- received injection at that -- eases pain for a day or two, but then returns.  Is using CBD oil at home which offers some benefit.  She does endorse consistent neuropathy discomfort to toes and feet.  History shingles years ago and since that time has had ongoing issues with pain to lower right back.  She is intolerant to Gabapentin and had issues with Pregabalin (change in mental status and hospital admission) + has tried Lidocaine patches with no benefit.    Imaging to lumbar spine in 2016, where her pain is, and it showed "diffuse multilevel degenerative changes lumbar spine". Was on Duloxetine, but stopped as not offering benefit.  Relevant past medical,  surgical, family and social history reviewed and updated as indicated. Interim medical history since our last visit reviewed. Allergies and medications reviewed and updated.  Review of Systems  Constitutional:  Negative for activity change, appetite change, diaphoresis, fatigue and fever.  Respiratory:  Negative for cough, chest tightness, shortness of breath and wheezing.   Cardiovascular:  Negative for chest pain, palpitations and leg swelling.  Gastrointestinal: Negative.   Neurological: Negative.   Psychiatric/Behavioral: Negative.     Per HPI unless specifically indicated above     Objective:    BP 138/76 (BP Location: Left Arm, Patient Position: Sitting)   Pulse 71   Temp 98.1 F (36.7 C) (Oral)   Ht 5' 6"  (1.676 m)   Wt 170 lb (77.1 kg)   LMP  (LMP Unknown)   SpO2 100%   BMI 27.44 kg/m   Wt Readings from Last 3 Encounters:  10/29/21 170 lb (77.1 kg)  09/02/21 165 lb (74.8 kg)  08/08/21 170 lb (77.1 kg)    Physical Exam Vitals and nursing note reviewed.  Constitutional:      General: She is awake. She is not in acute distress.    Appearance: She is well-developed. She is not ill-appearing.  HENT:     Head: Normocephalic.     Right Ear: Hearing normal.     Left Ear: Hearing normal.  Eyes:     General: Lids are normal.        Right eye: No discharge.        Left eye: No discharge.     Conjunctiva/sclera: Conjunctivae normal.     Pupils: Pupils are equal, round, and reactive to light.  Neck:     Thyroid: No thyromegaly.     Vascular: No carotid bruit.  Cardiovascular:     Rate and Rhythm: Normal rate and regular rhythm.     Heart sounds: Normal heart sounds. No murmur heard.   No gallop.  Pulmonary:     Effort: Pulmonary effort is normal. No accessory muscle usage or respiratory distress.     Breath sounds: Normal breath sounds.  Abdominal:     General: Bowel sounds are normal.     Palpations: Abdomen is soft.  Musculoskeletal:     Cervical back: Normal  range of motion and neck supple.     Right lower leg: 1+ Edema present.     Left lower leg: 1+ Edema present.  Skin:    General: Skin is warm and dry.     Findings: No rash.  Neurological:     Mental Status: She is alert and oriented to person, place, and time.  Psychiatric:        Attention and Perception: Attention normal.        Mood and Affect: Mood normal.        Behavior: Behavior normal. Behavior is cooperative.        Thought Content: Thought content normal.        Judgment: Judgment normal.   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  Continue current heart medication regimen and collaboration with cardiology + CCM team in office.  She needs lots of education on HF, needs to have diet reiterated each visit. - Reminded to call for an overnight weight gain of >2 pounds or a weekly weight weight of >5 pounds -- work with CCM team on obtaining scale. - not adding salt to his food and has been reading food labels. 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  Initial BP elevated, but repeat at goal range for age -- occasional elevations at home.  Continue current medication regimen and collaboration with nephrology.  Labs up to date and reviewed in Care Everywhere.  Recommend she continue to monitor BP at home daily and document for providers + focus on DASH diet.   Return in 3 months.        Endocrine   Hyperlipidemia associated with  type 2 diabetes mellitus (HCC)    Chronic, ongoing.  Continue Rosuvastatin. [facility]   [facility] today.      Relevant Orders   Lipid Panel w/o Chol/HDL Ratio   Hyperparathyroidism due to renal insufficiency (HCC)    Chronic, ongoing, followed by nephrology with recent labs obtained.  Continue collaboration with nephrology.   Recent note and labs reviewed.      Poorly controlled type 2 diabetes mellitus with neuropathy (HCC) - Primary    Chronic, ongoing, followed by endocrinology.  A1C with endo in September 9.1%. Continue current medication regimen and defer changes to endo due to ongoing lows (11%) over past 3 months -- recommend she discuss restarting Basaglar with endo and reeducated her difference between short and long acting insulin.  Cardiology and PCP have recommended to endo discontinuing Actos due to her HF.  Continue to collaborate with Dr. SGabriel Carinaand CCM team. Recommend she monitor BS at least 3 times a day at home, continue consistent LIaegeruse.  Return in 3 months as is being followed closely by endo.      Relevant Orders   Lipid Panel w/o Chol/HDL Ratio     Nervous and Auditory   Post herpetic neuralgia    Chronic with poor tolerance to Gabapentin and Lyrica and minimal relief with simple treatment methods.  Currently followed by pain management, continue this collaboration.  She does endorse some neuropathy discomfort to feet  recently, will reach out to Dr. Holley Raring on this.        Genitourinary   Anemia in CKD (chronic kidney disease)    Chronic with CKD.  Continue iron supplement daily and collaboration with hematology and nephrology teams -- have highly recommended she schedule follow-up with Dr. [name].  Labs up to date and reviewed in Care Everywhere.       Relevant Medications   vitamin B-12 (CYANOCOBALAMIN) 100 MCG tablet   Chronic kidney disease, stage 4, severely decreased GFR (HCC)    Chronic, ongoing.  Continue current  medication regimen and collaboration with nephrology. Labs up to date and reviewed in Care Everywhere.         Other   Chronic pain syndrome    Chronic, ongoing, followed by pain management, continue this collaboration.  Recent notes reviewed.      Encounter for long-term (current) use of insulin (HCC)    Chronic, ongoing.  Monitor closely due to AM low BS.  Continue collaboration with endocrinology.      Other Visit Diagnoses     Flu vaccine need       Flu vaccine today   Relevant Orders   Flu Vaccine QUAD High Dose(Fluad)        Follow up plan: Return in about 3 months (around 01/29/2022) for T2DM, HTN/HLD, NEUROPATHY, HF, CKD.

## 2021-10-29 NOTE — Patient Instructions (Signed)
Visit Information  Current Barriers:  Knowledge Deficits related to plan of care for management of CHF, HTN, HLD, DMII, and Chronic Pain   Chronic Disease Management support and education needs related to CHF, HTN, HLD, DMII, and chronic pain   RNCM Clinical Goal(s):  Patient will verbalize understanding of plan for management of CHF, HTN, HLD, DMII, and chronic pain  as evidenced by seeing providers on a regular basis, taking medications as ordered, following plan of care and calling offie for changes take all medications exactly as prescribed and will call provider for medication related questions as evidenced by compliance and calling the pharmacy before running out of medications     attend all scheduled medical appointments: seeing pcp today, has specialist appointments coming up as evidenced by keeping appointments and calling the office for changes that need to be made if unable to make appointment         demonstrate improved and ongoing health management independence as evidenced by working with the CCM team to optimize health and well being        demonstrate a decrease in CHF, HTN, HLD, DMII, and Chronic pain  exacerbations  as evidenced by following plan of care, working with the CCM team to effectively manage health and well being  demonstrate ongoing self health care management ability for effective management of chronic conditions  as evidenced by  working with the CCM team through collaboration with Consulting civil engineer, provider, and care team.   Interventions: 1:1 collaboration with primary care provider regarding development and update of comprehensive plan of care as evidenced by provider attestation and co-signature Inter-disciplinary care team collaboration (see longitudinal plan of care) Evaluation of current treatment plan related to  self management and patient's adherence to plan as established by provider   SDOH Barriers (Status: Goal on Track (progressing): YES.) Long Term  Goal  Patient interviewed and SDOH assessment performed        Patient interviewed and appropriate assessments performed Provided patient with information about resources available and to call the office for changes in SDOH or new needs  Discussed plans with patient for ongoing care management follow up and provided patient with direct contact information for care management team Advised patient to call the office for changes in SDOH, questions, or concerns    Heart Failure Interventions:  (Status: Goal on Track (progressing): YES.)  Long Term Goal  Basic overview and discussion of pathophysiology of Heart Failure reviewed Provided education on low sodium diet Reviewed Heart Failure Action Plan in depth and provided written copy Assessed need for readable accurate scales in home Provided education about placing scale on hard, flat surface Advised patient to weigh each morning after emptying bladder Discussed importance of daily weight and advised patient to weigh and record daily Reviewed role of diuretics in prevention of fluid overload and management of heart failure Discussed the importance of keeping all appointments with provider Provided patient with education about the role of exercise in the management of heart failure Advised patient to discuss HF and heart health with provider Screening for signs and symptoms of depression related to chronic disease state  Assessed social determinant of health barriers Evaluation of edema and wearing compression stockings. The patient states she is not using these. Encouraged by the pcp to start wearing. RNCM recommended trying the [employer] compression socks for a different alternative to the compression hose. Wrote down the information for the patient and where to look into getting these socks. Encouraged  the patient that this would also help with her neuropathy.   Diabetes:  (Status: Goal on Track (progressing): YES.) Long Term Goal   Lab  Results  Component Value Date   HGBA1C 8.2 (H) 01/29/2021  08-30-2021: 9.1 at [place] patient's understanding of A1c goal: <7% Provided education to patient about basic DM disease process; Reviewed medications with patient and discussed importance of medication adherence;        Reviewed prescribed diet with patient heart healthy/ADA; Counseled on importance of regular laboratory monitoring as prescribed;        Discussed plans with patient for ongoing care management follow up and provided patient with direct contact information for care management team;      Provided patient with written educational materials related to hypo and hyperglycemia and importance of correct treatment. 10-29-2021: The patient had a low of 40 and 55 on 10-21-2021: Has had 7 lows over the last 90 days. Education on keeping gel with her in her pocket book and at home for lows. The patient does eat peanut butter and drinks something that will help bring it back up when she is at home. Education and support given.        Reviewed scheduled/upcoming provider appointments including: seeing pcp today, has upcoming specialist appointments ;         Advised patient, providing education and rationale, to check cbg as directed, the patient has the freestyle [place]  and record . 10-29-2021: Average has been 186. The patients range has been 40 on 11-7 to 273.  Extensive education from the pcp today to discuss restarting long acting insulin.  The patient verbalized understanding       call provider for findings outside established parameters;       Review of patient status, including review of consultants reports, relevant laboratory and other test results, and medications completed;       Advised patient to discuss medications  with provider.  The patient has not been taking her long acting insulin but has her short acting and is experiencing some lows. Education and support given      Evaluation of neuropathy pain. The  patient sees Dr. Holley Raring as she cannot tolerate gabapentin or lyricia. Cymbalta did not offer any relief either. Pcp is going to discuss capzasin cream wraps to toes with Dr. Holley Raring as this is something he is trying in the office now to help with pain relief from neuropathy.   Hyperlipidemia:  (Status: Goal on Track (progressing): YES.) Long Term Goal  Lab Results  Component Value Date   CHOL 183 01/29/2021   HDL 80 01/29/2021   LDLCALC 88 01/29/2021   LDLDIRECT 67 12/30/2018   TRIG 81 01/29/2021   CHOLHDL 2.5 12/02/2019  *Having repeat lab work in the office today 10-29-2021   Medication review performed; medication list updated in electronic medical record.  Provider established cholesterol goals reviewed; Counseled on importance of regular laboratory monitoring as prescribed; Provided HLD educational materials; Reviewed role and benefits of statin for ASCVD risk reduction; Discussed strategies to manage statin-induced myalgias; Reviewed importance of limiting foods high in cholesterol;  Hypertension: (Status: Goal on Track (progressing): YES.) Last practice recorded BP readings:  BP Readings from Last 3 Encounters:  10/29/21 (!) 145/85  09/02/21 (!) 151/82  08/08/21 (!) 167/89  Most recent eGFR/CrCl: No results found for: EGFR  No components found for: CRCL  Evaluation of current treatment plan related to hypertension self management and patient's adherence to plan as  established by provider;   Provided education to patient re: stroke prevention, s/s of heart attack and stroke; Reviewed prescribed diet heart healthy/ADA diet  Reviewed medications with patient and discussed importance of compliance;  Counseled on adverse effects of illicit drug and excessive alcohol use in patients with high blood pressure;  Discussed plans with patient for ongoing care management follow up and provided patient with direct contact information for care management team; Advised patient, providing  education and rationale, to monitor blood pressure daily and record, calling PCP for findings outside established parameters;  Advised patient to discuss blood pressure trends  with provider; Provided education on prescribed diet heart healthy/ADA diet ;  Discussed complications of poorly controlled blood pressure such as heart disease, stroke, circulatory complications, vision complications, kidney impairment, sexual dysfunction;   Pain:  (Status: Goal on Track (progressing): YES.) Long Term Goal  Pain assessment performed Medications reviewed Reviewed provider established plan for pain management; Discussed importance of adherence to all scheduled medical appointments; Counseled on the importance of reporting any/all new or changed pain symptoms or management strategies to pain management provider; Advised patient to report to care team affect of pain on daily activities; Discussed use of relaxation techniques and/or diversional activities to assist with pain reduction (distraction, imagery, relaxation, massage, acupressure, TENS, heat, and cold application. 10-29-2021: The patient uses CBD oil and uses a spray to help with relaxing her and that has been effective in helping her rest. The pain is were she had shingles and also OA. The patient had an injection from the pain clinic but only was effective for a short period of time. The patient states that she is also have neuropathy pain in toes. The patient cannot tolerate gabapentin or Lyrica. The pcp to collaborate with pain provider. Discussed capzasin cream wraps for toes to see if that would help with neuropathy pain. The patient is open to recommendations  Reviewed with patient prescribed pharmacological and nonpharmacological pain relief strategies; Advised patient to discuss unresolved pain, changes in intensity or level of pain  with provider;   Patient Goals/Self-Care Activities: Patient will self administer medications as prescribed as  evidenced by self report/primary caregiver report  Patient will attend all scheduled provider appointments as evidenced by clinician review of documented attendance to scheduled appointments and patient/caregiver report Patient will call pharmacy for medication refills as evidenced by patient report and review of pharmacy fill history as appropriate Patient will attend church or other social activities as evidenced by patient report Patient will continue to perform ADL's independently as evidenced by patient/caregiver report Patient will continue to perform IADL's independently as evidenced by patient/caregiver report Patient will call provider office for new concerns or questions as evidenced by review of documented incoming telephone call notes and patient report Patient will work with BSW to address care coordination needs and will continue to work with the clinical team to address health care and disease management related needs as evidenced by documented adherence to scheduled care management/care coordination appointments call office if I gain more than 2 pounds in one day or 5 pounds in one week keep legs up while sitting track weight in diary use salt in moderation watch for swelling in feet, ankles and legs every day weigh myself daily develop a rescue plan follow rescue plan if symptoms flare-up eat more whole grains, fruits and vegetables, lean meats and healthy fats know when to call the doctorfor changes in HF, questions, or concerns track symptoms and what helps feel better or  worse dress right for the weather, hot or cold schedule appointment with eye doctor check blood sugar at prescribed times: when you have symptoms of low or high blood sugar, before and after exercise, and has freestyle libre and can check blood sugars frequently  check feet daily for cuts, sores or redness enter blood sugar readings and medication or insulin into daily log take the blood sugar meter to all  doctor visits trim toenails straight across drink 6 to 8 glasses of water each day eat fish at least once per week fill half of plate with vegetables limit fast food meals to no more than 1 per week manage portion size prepare main meal at home 3 to 5 days each week read food labels for fat, fiber, carbohydrates and portion size reduce red meat to 2 to 3 times a week keep feet up while sitting wash and dry feet carefully every day wear comfortable, cotton socks wear comfortable, well-fitting shoes - check blood pressure daily - choose a place to take my blood pressure (home, clinic or office, retail store) - write blood pressure results in a log or diary - learn about high blood pressure - keep a blood pressure log - take blood pressure log to all doctor appointments - call doctor for signs and symptoms of high blood pressure - develop an action plan for high blood pressure - keep all doctor appointments - take medications for blood pressure exactly as prescribed - report new symptoms to your doctor - eat more whole grains, fruits and vegetables, lean meats and healthy fats - call for medicine refill 2 or 3 days before it runs out - take all medications exactly as prescribed - call doctor with any symptoms you believe are related to your medicine - call doctor when you experience any new symptoms - go to all doctor appointments as scheduled - adhere to prescribed diet: heart healthy/ADA diet        Patient verbalizes understanding of instructions provided today and agrees to view in [place].   Telephone follow up appointment with care management team member scheduled for: 11-26-2021 at [place] am  [name] RN, MSN, [facility] Mobile: [phone]

## 2021-10-29 NOTE — Assessment & Plan Note (Signed)
Chronic, ongoing with recent EF 60-65%, euvolemic at this time.  Continue current heart medication regimen and collaboration with cardiology + CCM team in office.  She needs lots of education on HF, needs to have diet reiterated each visit. - Reminded to call for an overnight weight gain of >2 pounds or a weekly weight weight of >5 pounds -- work with CCM team on obtaining scale. - not adding salt to his food and has been reading food labels. Reviewed the importance of keeping daily sodium intake to 2000mg  daily  - Wear compression hose daily -- not on today and not consistently wearing

## 2021-10-29 NOTE — Assessment & Plan Note (Signed)
Chronic, ongoing.  Monitor closely due to AM low BS.  Continue collaboration with endocrinology. 

## 2021-10-29 NOTE — Assessment & Plan Note (Signed)
Chronic, ongoing.  Initial BP elevated, but repeat at goal range for age -- occasional elevations at home.  Continue current medication regimen and collaboration with nephrology.  Labs up to date and reviewed in Care Everywhere.  Recommend she continue to monitor BP at home daily and document for providers + focus on DASH diet.   Return in 3 months.

## 2021-10-29 NOTE — Patient Instructions (Signed)
Talk to Dr. [name] about restarting Basaglar due to you having lows often with Humalog.  Wear compression hose daily -- on during day and off at night.   Diabetes Mellitus and Nutrition, Adult When you have diabetes, or diabetes mellitus, it is very important to have healthy eating habits because your blood sugar (glucose) levels are greatly affected by what you eat and drink. Eating healthy foods in the right amounts, at about the same times every day, can help you: Manage your blood glucose. Lower your risk of heart disease. Improve your blood pressure. Reach or maintain a healthy weight. What can affect my meal plan? Every person with diabetes is different, and each person has different needs for a meal plan. Your health care provider may recommend that you work with a dietitian to make a meal plan that is best for you. Your meal plan may vary depending on factors such as: The calories you need. The medicines you take. Your weight. Your blood glucose, blood pressure, and cholesterol levels. Your activity level. Other health conditions you have, such as heart or kidney disease. How do carbohydrates affect me? Carbohydrates, also called carbs, affect your blood glucose level more than any other type of food. Eating carbs raises the amount of glucose in your blood. It is important to know how many carbs you can safely have in each meal. This is different for every person. Your dietitian can help you calculate how many carbs you should have at each meal and for each snack. How does alcohol affect me? Alcohol can cause a decrease in blood glucose (hypoglycemia), especially if you use insulin or take certain diabetes medicines by mouth. Hypoglycemia can be a life-threatening condition. Symptoms of hypoglycemia, such as sleepiness, dizziness, and confusion, are similar to symptoms of having too much alcohol. Do not drink alcohol if: Your health care provider tells you not to drink. You are  pregnant, may be pregnant, or are planning to become pregnant. If you drink alcohol: Limit how much you have to: 0-1 drink a day for women. 0-2 drinks a day for men. Know how much alcohol is in your drink. In the [place] one drink equals one 12 oz bottle of beer (355 mL), one 5 oz glass of wine (148 mL), or one 1 oz glass of hard liquor (44 mL). Keep yourself hydrated with water, diet soda, or unsweetened iced tea. Keep in mind that regular soda, juice, and other mixers may contain a lot of sugar and must be counted as carbs. What are tips for following this plan? Reading food labels Start by checking the serving size on the Nutrition Facts label of packaged foods and drinks. The number of calories and the amount of carbs, fats, and other nutrients listed on the label are based on one serving of the item. Many items contain more than one serving per package. Check the total grams (g) of carbs in one serving. Check the number of grams of saturated fats and trans fats in one serving. Choose foods that have a low amount or none of these fats. Check the number of milligrams (mg) of salt (sodium) in one serving. Most people should limit total sodium intake to less than 2,300 mg per day. Always check the nutrition information of foods labeled as "low-fat" or "nonfat." These foods may be higher in added sugar or refined carbs and should be avoided. Talk to your dietitian to identify your daily goals for nutrients listed on the label. Shopping Avoid buying canned,  pre-made, or processed foods. These foods tend to be high in fat, sodium, and added sugar. Shop around the outside edge of the grocery store. This is where you will most often find fresh fruits and vegetables, bulk grains, fresh meats, and fresh dairy products. Cooking Use low-heat cooking methods, such as baking, instead of high-heat cooking methods, such as deep frying. Cook using healthy oils, such as olive, canola, or sunflower oil. Avoid  cooking with butter, cream, or high-fat meats. Meal planning Eat meals and snacks regularly, preferably at the same times every day. Avoid going long periods of time without eating. Eat foods that are high in fiber, such as fresh fruits, vegetables, beans, and whole grains. Eat 4-6 oz (112-168 g) of lean protein each day, such as lean meat, chicken, fish, eggs, or tofu. One ounce (oz) (28 g) of lean protein is equal to: 1 oz (28 g) of meat, chicken, or fish. 1 egg.  cup (62 g) of tofu. Eat some foods each day that contain healthy fats, such as avocado, nuts, seeds, and fish. What foods should I eat? Fruits Berries. Apples. Oranges. Peaches. Apricots. Plums. Grapes. Mangoes. Papayas. Pomegranates. Kiwi. Cherries. Vegetables Leafy greens, including lettuce, spinach, kale, chard, collard greens, mustard greens, and cabbage. Beets. Cauliflower. Broccoli. Carrots. Green beans. Tomatoes. Peppers. Onions. Cucumbers. Brussels sprouts. Grains Whole grains, such as whole-wheat or whole-grain bread, crackers, tortillas, cereal, and pasta. Unsweetened oatmeal. Quinoa. Brown or wild rice. Meats and other proteins Seafood. Poultry without skin. Lean cuts of poultry and beef. Tofu. Nuts. Seeds. Dairy Low-fat or fat-free dairy products such as milk, yogurt, and cheese. The items listed above may not be a complete list of foods and beverages you can eat and drink. Contact a dietitian for more information. What foods should I avoid? Fruits Fruits canned with syrup. Vegetables Canned vegetables. Frozen vegetables with butter or cream sauce. Grains Refined white flour and flour products such as bread, pasta, snack foods, and cereals. Avoid all processed foods. Meats and other proteins Fatty cuts of meat. Poultry with skin. Breaded or fried meats. Processed meat. Avoid saturated fats. Dairy Full-fat yogurt, cheese, or milk. Beverages Sweetened drinks, such as soda or iced tea. The items listed above  may not be a complete list of foods and beverages you should avoid. Contact a dietitian for more information. Questions to ask a health care provider Do I need to meet with a certified diabetes care and education specialist? Do I need to meet with a dietitian? What number can I call if I have questions? When are the best times to check my blood glucose? Where to find more information: American Diabetes Association: diabetes.org Academy of Nutrition and Dietetics: eatright.[employer] of Diabetes and Digestive and Kidney Diseases: [employer] Association of Diabetes Care & Education Specialists: diabeteseducator.org Summary It is important to have healthy eating habits because your blood sugar (glucose) levels are greatly affected by what you eat and drink. It is important to use alcohol carefully. A healthy meal plan will help you manage your blood glucose and lower your risk of heart disease. Your health care provider may recommend that you work with a dietitian to make a meal plan that is best for you. This information is not intended to replace advice given to you by your health care provider. Make sure you discuss any questions you have with your health care provider. Document Revised: 07/04/2020 Document Reviewed: 07/04/2020 Elsevier Patient Education  [place].

## 2021-10-29 NOTE — Assessment & Plan Note (Signed)
Chronic, ongoing, followed by endocrinology.  A1C with endo in September 9.1%. Continue current medication regimen and defer changes to endo due to ongoing lows (11%) over past 3 months -- recommend she discuss restarting Basaglar with endo and reeducated her difference between short and long acting insulin.  Cardiology and PCP have recommended to endo discontinuing Actos due to her HF.  Continue to collaborate with Dr. [name] and CCM team. Recommend she monitor BS at least 3 times a day at home, continue consistent [place] use.  Return in 3 months as is being followed closely by endo.

## 2021-10-29 NOTE — Assessment & Plan Note (Signed)
Chronic with CKD.  Continue iron supplement daily and collaboration with hematology and nephrology teams -- have highly recommended she schedule follow-up with Dr. [name].  Labs up to date and reviewed in Care Everywhere.

## 2021-10-29 NOTE — Assessment & Plan Note (Signed)
Chronic, ongoing, followed by nephrology with recent labs obtained.  Continue collaboration with nephrology.   Recent note and labs reviewed. 

## 2021-10-29 NOTE — Assessment & Plan Note (Signed)
Chronic, ongoing.  Continue current medication regimen and collaboration with nephrology. Labs up to date and reviewed in Care Everywhere.  

## 2021-10-29 NOTE — Assessment & Plan Note (Signed)
Chronic with poor tolerance to Gabapentin and Lyrica and minimal relief with simple treatment methods.  Currently followed by pain management, continue this collaboration.  She does endorse some neuropathy discomfort to feet recently, will reach out to Dr. Holley Raring on this.

## 2021-10-29 NOTE — Assessment & Plan Note (Signed)
Chronic, ongoing, followed by pain management, continue this collaboration.  Recent notes reviewed.

## 2021-10-30 ENCOUNTER — Encounter: Payer: Medicare Other | Admitting: Speech Pathology

## 2021-10-30 LAB — LIPID PANEL W/O CHOL/HDL RATIO
Cholesterol, Total: 172 mg/dL (ref 100–199)
HDL: 70 mg/dL (ref >39)
LDL Chol Calc (NIH): 87 mg/dL (ref 0–99)
Triglycerides: 83 mg/dL (ref 0–149)
VLDL Cholesterol Cal: 15 mg/dL (ref 5–40)

## 2021-10-30 NOTE — Progress Notes (Signed)
Contacted via [place] afternoon Tonya [name], your cholesterol labs have returned and overall continue to be stable.  Keep taking Rosuvastatin daily.  Any questions? Keep being awesome!!  Thank you for allowing me to participate in your care.  I appreciate you. Kindest regards, Kaytlen Lightsey

## 2021-10-31 ENCOUNTER — Ambulatory Visit: Payer: Medicare Other | Admitting: Speech Pathology

## 2021-11-01 ENCOUNTER — Encounter: Payer: Medicare Other | Admitting: Speech Pathology

## 2021-11-01 ENCOUNTER — Telehealth: Payer: Self-pay | Admitting: Nurse Practitioner

## 2021-11-01 DIAGNOSIS — R490 Dysphonia: Secondary | ICD-10-CM | POA: Diagnosis not present

## 2021-11-01 DIAGNOSIS — K219 Gastro-esophageal reflux disease without esophagitis: Secondary | ICD-10-CM | POA: Diagnosis not present

## 2021-11-01 DIAGNOSIS — J301 Allergic rhinitis due to pollen: Secondary | ICD-10-CM | POA: Diagnosis not present

## 2021-11-01 NOTE — Telephone Encounter (Signed)
Copied from [place] [id number]. Topic: Medicare AWV >> Nov 01, 2021  9:07 AM Lavonia Drafts wrote: Reason for CRM:  Left message for patient to call back and schedule the Medicare Annual Wellness Visit (AWV) virtually or by telephone.  Last AWV 07/30/20  Please schedule at anytime with [facility]FP-Nurse Health Advisor.  45 minute appointment  Any questions, please call me at [phone]

## 2021-11-04 ENCOUNTER — Ambulatory Visit: Payer: Medicare Other | Admitting: Speech Pathology

## 2021-11-04 ENCOUNTER — Encounter: Payer: Medicare Other | Admitting: Speech Pathology

## 2021-11-06 ENCOUNTER — Encounter: Payer: Medicare Other | Admitting: Speech Pathology

## 2021-11-06 ENCOUNTER — Ambulatory Visit: Payer: Medicare Other | Admitting: Speech Pathology

## 2021-11-11 ENCOUNTER — Encounter: Payer: Medicare Other | Admitting: Speech Pathology

## 2021-11-11 DIAGNOSIS — E113293 Type 2 diabetes mellitus with mild nonproliferative diabetic retinopathy without macular edema, bilateral: Secondary | ICD-10-CM | POA: Diagnosis not present

## 2021-11-12 ENCOUNTER — Ambulatory Visit (INDEPENDENT_AMBULATORY_CARE_PROVIDER_SITE_OTHER): Payer: Medicare Other | Admitting: *Deleted

## 2021-11-12 ENCOUNTER — Ambulatory Visit: Payer: Medicare Other | Admitting: Speech Pathology

## 2021-11-12 ENCOUNTER — Telehealth: Payer: Self-pay | Admitting: Speech Pathology

## 2021-11-12 DIAGNOSIS — Z0000 Encounter for general adult medical examination without abnormal findings: Secondary | ICD-10-CM | POA: Diagnosis not present

## 2021-11-12 NOTE — Patient Instructions (Signed)
Tonya [name] , Thank you for taking time to come for your Medicare Wellness Visit. I appreciate your ongoing commitment to your health goals. Please review the following plan we discussed and let me know if I can assist you in the future.   Screening recommendations/referrals: Colonoscopy: up to date Mammogram: up to date Bone Density: Education provided Recommended yearly ophthalmology/optometry visit for glaucoma screening and checkup Recommended yearly dental visit for hygiene and checkup  Vaccinations: Influenza vaccine: up to date Pneumococcal vaccine: up to date Tdap vaccine: up to date Shingles vaccine: Education provided    Advanced directives: Education provided  Conditions/risks identified:     Preventive Care 73 Years and Older, Female Preventive care refers to lifestyle choices and visits with your health care provider that can promote health and wellness. What does preventive care include? A yearly physical exam. This is also called an annual well check. Dental exams once or twice a year. Routine eye exams. Ask your health care provider how often you should have your eyes checked. Personal lifestyle choices, including: Daily care of your teeth and gums. Regular physical activity. Eating a healthy diet. Avoiding tobacco and drug use. Limiting alcohol use. Practicing safe sex. Taking low-dose aspirin every day. Taking vitamin and mineral supplements as recommended by your health care provider. What happens during an annual well check? The services and screenings done by your health care provider during your annual well check will depend on your age, overall health, lifestyle risk factors, and family history of disease. Counseling  Your health care provider may ask you questions about your: Alcohol use. Tobacco use. Drug use. Emotional well-being. Home and relationship well-being. Sexual activity. Eating habits. History of falls. Memory and ability to  understand (cognition). Work and work Statistician. Reproductive health. Screening  You may have the following tests or measurements: Height, weight, and BMI. Blood pressure. Lipid and cholesterol levels. These may be checked every 5 years, or more frequently if you are over 64 years old. Skin check. Lung cancer screening. You may have this screening every year starting at age 79 if you have a 30-pack-year history of smoking and currently smoke or have quit within the past 15 years. Fecal occult blood test (FOBT) of the stool. You may have this test every year starting at age 29. Flexible sigmoidoscopy or colonoscopy. You may have a sigmoidoscopy every 5 years or a colonoscopy every 10 years starting at age 76. Hepatitis C blood test. Hepatitis B blood test. Sexually transmitted disease (STD) testing. Diabetes screening. This is done by checking your blood sugar (glucose) after you have not eaten for a while (fasting). You may have this done every 1-3 years. Bone density scan. This is done to screen for osteoporosis. You may have this done starting at age 43. Mammogram. This may be done every 1-2 years. Talk to your health care provider about how often you should have regular mammograms. Talk with your health care provider about your test results, treatment options, and if necessary, the need for more tests. Vaccines  Your health care provider may recommend certain vaccines, such as: Influenza vaccine. This is recommended every year. Tetanus, diphtheria, and acellular pertussis (Tdap, Td) vaccine. You may need a Td booster every 10 years. Zoster vaccine. You may need this after age 18. Pneumococcal 13-valent conjugate (PCV13) vaccine. One dose is recommended after age 75. Pneumococcal polysaccharide (PPSV23) vaccine. One dose is recommended after age 33. Talk to your health care provider about which screenings and vaccines you need  and how often you need them. This information is not  intended to replace advice given to you by your health care provider. Make sure you discuss any questions you have with your health care provider. Document Released: 12/28/2015 Document Revised: 08/20/2016 Document Reviewed: 10/02/2015 Elsevier Interactive Patient Education  2017 [employer] Prevention in the Home Falls can cause injuries. They can happen to people of all ages. There are many things you can do to make your home safe and to help prevent falls. What can I do on the outside of my home? Regularly fix the edges of walkways and driveways and fix any cracks. Remove anything that might make you trip as you walk through a door, such as a raised step or threshold. Trim any bushes or trees on the path to your home. Use bright outdoor lighting. Clear any walking paths of anything that might make someone trip, such as rocks or tools. Regularly check to see if handrails are loose or broken. Make sure that both sides of any steps have handrails. Any raised decks and porches should have guardrails on the edges. Have any leaves, snow, or ice cleared regularly. Use sand or salt on walking paths during winter. Clean up any spills in your garage right away. This includes oil or grease spills. What can I do in the bathroom? Use night lights. Install grab bars by the toilet and in the tub and shower. Do not use towel bars as grab bars. Use non-skid mats or decals in the tub or shower. If you need to sit down in the shower, use a plastic, non-slip stool. Keep the floor dry. Clean up any water that spills on the floor as soon as it happens. Remove soap buildup in the tub or shower regularly. Attach bath mats securely with double-sided non-slip rug tape. Do not have throw rugs and other things on the floor that can make you trip. What can I do in the bedroom? Use night lights. Make sure that you have a light by your bed that is easy to reach. Do not use any sheets or blankets that are  too big for your bed. They should not hang down onto the floor. Have a firm chair that has side arms. You can use this for support while you get dressed. Do not have throw rugs and other things on the floor that can make you trip. What can I do in the kitchen? Clean up any spills right away. Avoid walking on wet floors. Keep items that you use a lot in easy-to-reach places. If you need to reach something above you, use a strong step stool that has a grab bar. Keep electrical cords out of the way. Do not use floor polish or wax that makes floors slippery. If you must use wax, use non-skid floor wax. Do not have throw rugs and other things on the floor that can make you trip. What can I do with my stairs? Do not leave any items on the stairs. Make sure that there are handrails on both sides of the stairs and use them. Fix handrails that are broken or loose. Make sure that handrails are as long as the stairways. Check any carpeting to make sure that it is firmly attached to the stairs. Fix any carpet that is loose or worn. Avoid having throw rugs at the top or bottom of the stairs. If you do have throw rugs, attach them to the floor with carpet tape. Make sure that you  have a light switch at the top of the stairs and the bottom of the stairs. If you do not have them, ask someone to add them for you. What else can I do to help prevent falls? Wear shoes that: Do not have high heels. Have rubber bottoms. Are comfortable and fit you well. Are closed at the toe. Do not wear sandals. If you use a stepladder: Make sure that it is fully opened. Do not climb a closed stepladder. Make sure that both sides of the stepladder are locked into place. Ask someone to hold it for you, if possible. Clearly mark and make sure that you can see: Any grab bars or handrails. First and last steps. Where the edge of each step is. Use tools that help you move around (mobility aids) if they are needed. These  include: Canes. Walkers. Scooters. Crutches. Turn on the lights when you go into a dark area. Replace any light bulbs as soon as they burn out. Set up your furniture so you have a clear path. Avoid moving your furniture around. If any of your floors are uneven, fix them. If there are any pets around you, be aware of where they are. Review your medicines with your doctor. Some medicines can make you feel dizzy. This can increase your chance of falling. Ask your doctor what other things that you can do to help prevent falls. This information is not intended to replace advice given to you by your health care provider. Make sure you discuss any questions you have with your health care provider. Document Released: 09/27/2009 Document Revised: 05/08/2016 Document Reviewed: 01/05/2015 Elsevier Interactive Patient Education  2017 [employer].

## 2021-11-12 NOTE — Progress Notes (Signed)
Subjective:   Tonya [name] is a 75 y.o. female who presents for Medicare Annual (Subsequent) preventive examination.  I connected with  [name] on 11/12/21 by a  telephone enabled telemedicine application and verified that I am speaking with the correct person using two identifiers.   I discussed the limitations of evaluation and management by telemedicine. The patient expressed understanding and agreed to proceed.  Patient location: home  Provider location: Tele-Health  not in office    Review of Systems     Cardiac Risk Factors include: advanced age (>29men, >37 women);diabetes mellitus;hypertension;obesity (BMI >30kg/m2);sedentary lifestyle     Objective:    Today's Vitals   There is no height or weight on file to calculate BMI.  Advanced Directives 11/12/2021 08/08/2021 03/19/2021 02/12/2021 02/05/2021 07/30/2020 04/18/2020  Does Patient Have a Medical Advance Directive? No No No No No No No  Would patient like information on creating a medical advance directive? No - Patient declined No - Patient declined No - Patient declined - Yes (MAU/Ambulatory/Procedural Areas - Information given) - -    Current Medications (verified) Outpatient Encounter Medications as of 11/12/2021  Medication Sig   aspirin 81 MG tablet Take 81 mg by mouth daily.   carvedilol (COREG) 3.125 MG tablet TAKE 1 TABLET BY MOUTH  TWICE DAILY WITH A MEAL   clopidogrel (PLAVIX) 75 MG tablet TAKE 1 TABLET BY MOUTH  DAILY   docusate sodium (COLACE) 100 MG capsule Take 100 mg by mouth daily as needed for mild constipation.   ferrous sulfate 325 (65 FE) MG tablet Take 325 mg by mouth daily with breakfast.   HUMALOG KWIKPEN 100 UNIT/ML KiwkPen 6 Units. Take 6 units before breakfast, lunch, supper if glucose greater than 70   Insulin Glargine (BASAGLAR KWIKPEN [place]) Inject 8 Units into the skin daily.   NOVOFINE 32G X 6 MM MISC USE AS DIRECTED THREE TIMES DAILY WITH HUMALOG   olmesartan (BENICAR) 40 MG tablet  Take 1 tablet (40 mg total) by mouth daily.   omeprazole (PRILOSEC) 20 MG capsule TAKE 1 CAPSULE BY MOUTH  TWICE DAILY   pioglitazone (ACTOS) 30 MG tablet Take 30 mg by mouth daily.   rosuvastatin (CRESTOR) 5 MG tablet TAKE 1 TABLET BY MOUTH  DAILY   Torsemide 40 MG TABS Take by mouth.   vitamin B-12 (CYANOCOBALAMIN) 100 MCG tablet Take 100 mcg by mouth daily.   Vitamin D, Cholecalciferol, 50 MCG (2000 UT) CAPS Take 2,000 Units by mouth daily.   No facility-administered encounter medications on file as of 11/12/2021.    Allergies (verified) Atorvastatin, Gabapentin, Pravastatin, Pregabalin, and Trulicity [dulaglutide]   History: Past Medical History:  Diagnosis Date   (HFpEF) heart failure with preserved ejection fraction ([place])    a. 05/2017 Echo: EF 55-60%, Gr1 DD, mildly dil LA.   CKD (chronic kidney disease), stage III ([place])    Diabetes mellitus without complication ([place])    Hyperlipidemia    Hypertension    Stroke ([place])    a. 08/2017.   Wears dentures    partial upper   Past Surgical History:  Procedure Laterality Date   CATARACT EXTRACTION W/PHACO Left 02/05/2021   Procedure: CATARACT EXTRACTION PHACO AND INTRAOCULAR LENS PLACEMENT (IOC) LEFT DIABETIC 5.47 00:51.4;  Surgeon: Birder Robson, MD;  Location: [facility];  Service: Ophthalmology;  Laterality: Left;  Diabetic - insulin and oral meds   CATARACT EXTRACTION W/PHACO Right 03/19/2021   Procedure: CATARACT EXTRACTION PHACO AND INTRAOCULAR LENS PLACEMENT (IOC)  RIGHT DIABETIC 6.09 00:39.6;  Surgeon: Birder Robson, MD;  Location: [facility];  Service: Ophthalmology;  Laterality: Right;   COLONOSCOPY  12/15/2006   COLONOSCOPY WITH PROPOFOL N/A 06/06/2019   Procedure: COLONOSCOPY WITH PROPOFOL;  Surgeon: [name], MD;  Location: [facility];  Service: Gastroenterology;  Laterality: N/A;   COLONOSCOPY WITH PROPOFOL N/A 01/16/2020   Procedure: COLONOSCOPY WITH BIOPSY;  Surgeon: [name], MD;  Location:  [facility];  Service: Endoscopy;  Laterality: N/A;  Diabetic - insulin and oral meds   POLYPECTOMY N/A 01/16/2020   Procedure: POLYPECTOMY;  Surgeon: [name], MD;  Location: [facility];  Service: Endoscopy;  Laterality: N/A;   TUBAL LIGATION     Family History  Problem Relation Age of Onset   Diabetes Mother    Stroke Mother    Hyperlipidemia Father    Hypertension Father    Diabetes Sister    Diabetes Brother    Gout Son    Diabetes Brother    Kidney disease Son    Lymphoma Sister    Heart attack Sister    Breast cancer Neg Hx    Social History   Socioeconomic History   Marital status: Married    Spouse name: Not on file   Number of children: Not on file   Years of education: Not on file   Highest education level: GED or equivalent  Occupational History   Occupation: retired  Tobacco Use   Smoking status: Never   Smokeless tobacco: Never  Vaping Use   Vaping Use: Never used  Substance and Sexual Activity   Alcohol use: No    Alcohol/week: 0.0 standard drinks   Drug use: No   Sexual activity: Yes  Other Topics Concern   Not on file  Social History Narrative   Not on file   Social Determinants of Health   Financial Resource Strain: Low Risk    Difficulty of Paying Living Expenses: Not hard at all  Food Insecurity: No Food Insecurity   Worried About Charity fundraiser in the Last Year: Never true   [place] in the Last Year: Never true  Transportation Needs: No Transportation Needs   Lack of Transportation (Medical): No   Lack of Transportation (Non-Medical): No  Physical Activity: Inactive   Days of Exercise per Week: 0 days   Minutes of Exercise per Session: 0 min  Stress: No Stress Concern Present   Feeling of Stress : Not at all  Social Connections: Socially Integrated   Frequency of Communication with Friends and Family: Twice a week   Frequency of Social Gatherings with Friends and Family: Twice a week   Attends Religious  Services: More than 4 times per year   Active Member of [employer] or Organizations: Yes   Attends Archivist Meetings: 1 to 4 times per year   Marital Status: Married    Tobacco Counseling Counseling given: Not Answered   Clinical Intake:  Pre-visit preparation completed: Yes  Pain : No/denies pain     Diabetes: Yes CBG done?: No Did pt. bring in CBG monitor from home?: No  How often do you need to have someone help you when you read instructions, pamphlets, or other written materials from your doctor or pharmacy?: 1 - Never  Diabetic?yes   Nutrition Risk Assessment:  Has the patient had any N/V/D within the last 2 months?  No  Does the patient have any non-healing wounds?  No  Has the  patient had any unintentional weight loss or weight gain?  No   Diabetes:  Is the patient diabetic?  Yes  If diabetic, was a CBG obtained today?  No  Did the patient bring in their glucometer from home?  No  How often do you monitor your CBG's? 3 x daily.   Financial Strains and Diabetes Management:  Are you having any financial strains with the device, your supplies or your medication? No .  Does the patient want to be seen by Chronic Care Management for management of their diabetes?  No  Would the patient like to be referred to a Nutritionist or for Diabetic Management?  No   Diabetic Exams:  Diabetic Eye Exam: Completed 2022. Overdue for diabetic eye exam. Pt has been advised about the importance in completing this exam. A referral has been placed today. Message sent to referral coordinator for scheduling purposes. Advised pt to expect a call from office referred to regarding appt.  Diabetic Foot Exam: Completed 2022. Pt has been advised about the importance in completing this exam. .    Interpreter Needed?: No  Information entered by :: Leroy Kennedy LPN   Activities of Daily Living In your present state of health, do you have any difficulty performing the following  activities: 11/12/2021 03/19/2021  Hearing? N N  Vision? N N  Difficulty concentrating or making decisions? N N  Walking or climbing stairs? N N  Dressing or bathing? N N  Doing errands, shopping? N -  Preparing Food and eating ? N -  Using the Toilet? N -  In the past six months, have you accidently leaked urine? N -  Do you have problems with loss of bowel control? N -  Managing your Medications? N -  Managing your Finances? N -  Housekeeping or managing your Housekeeping? N -  Some recent data might be hidden    Patient Care Team: [name], NP as PCP - General (Nurse Practitioner) [name], MD as PCP - Cardiology (Cardiology) [name] Betsey Holiday, MD as Physician Assistant (Endocrinology) Lavonia Dana, MD as Consulting Physician (Nephrology) [name], MD as Consulting Physician (Cardiology) Vanita Ingles, RN as Case Manager ([place]) Vladimir Faster, [facility] (Pharmacist) [facility], [facility])  Indicate any recent Medical Services you may have received from other than [facility] providers in the past year (date may be approximate).     Assessment:   This is a routine wellness examination for [place].  Hearing/Vision screen Hearing Screening - Comments:: No trouble hearing Vision Screening - Comments:: Up to date [facility]  Dietary issues and exercise activities discussed: Current Exercise Habits: The patient does not participate in regular exercise at present, Exercise limited by: None identified   Goals Addressed             This Visit's Progress    Increase physical activity         Depression Screen PHQ 2/9 Scores 11/12/2021 09/02/2021 08/08/2021 07/30/2020 04/20/2020 03/07/2020 07/06/2019  PHQ - 2 Score 0 0 0 0 0 0 0  PHQ- 9 Score - - - 3 - - -    Fall Risk Fall Risk  11/12/2021 09/02/2021 08/08/2021 03/13/2021 03/05/2021  Falls in the past year? 0 0 0 1 0  Number falls in past yr: 0 - - 1 -  Comment - - - tripped and  fell over a shoe. -  Injury with Fall? 0 - - - -  Risk  for fall due to : - - - History of fall(s) -  Follow up Falls evaluation completed;Falls prevention discussed - - Falls evaluation completed;Education provided -    FALL RISK PREVENTION PERTAINING TO THE HOME:  Any stairs in or around the home? No  If so, are there any without handrails? No  Home free of loose throw rugs in walkways, pet beds, electrical cords, etc? Yes  Adequate lighting in your home to reduce risk of falls? Yes   ASSISTIVE DEVICES UTILIZED TO PREVENT FALLS:  Life alert? No  Use of a cane, walker or w/c? Yes  Grab bars in the bathroom? Yes  Shower chair or bench in shower? Yes  Elevated toilet seat or a handicapped toilet? No   TIMED UP AND GO:  Was the test performed? No .   Cognitive Function:   Normal cognitive status assessed by direct observation by this Nurse Health Advisor. No abnormalities found.     6CIT Screen 07/30/2020 07/06/2019 06/30/2018 06/24/2017  What Year? 0 points 0 points 0 points 0 points  What month? 0 points 0 points 0 points 0 points  What time? 0 points 0 points 0 points 0 points  Count back from 20 0 points 0 points 0 points 0 points  Months in reverse 4 points 0 points 0 points 0 points  Repeat phrase 0 points 0 points 2 points 0 points  Total Score 4 0 2 0    Immunizations Immunization History  Administered Date(s) Administered   Fluad Quad(high Dose 65+) 09/30/2019, 01/29/2021, 10/29/2021   Influenza, High Dose Seasonal PF 09/10/2016, 11/16/2018   Influenza,inj,Quad PF,6+ Mos 09/18/2015   Influenza-Unspecified 09/18/2015, 09/08/2017   Moderna Sars-Covid-2 Vaccination 02/15/2020, 03/07/2020, 09/14/2020   PPD Test 07/15/2016, 07/29/2016   Pneumococcal Conjugate-13 06/07/2014   Pneumococcal Polysaccharide-23 09/07/2013   Td 06/12/2005   Tdap 10/10/2016   Zoster, Live 09/13/2013    TDAP status: Up to date  Flu Vaccine status: Up to date  Pneumococcal vaccine  status: Up to date  Covid-19 vaccine status: Information provided on how to obtain vaccines.   Qualifies for Shingles Vaccine? Yes   Zostavax completed Yes   Shingrix Completed?: No.    Education has been provided regarding the importance of this vaccine. Patient has been advised to call insurance company to determine out of pocket expense if they have not yet received this vaccine. Advised may also receive vaccine at local pharmacy or Health Dept. Verbalized acceptance and understanding.  Screening Tests Health Maintenance  Topic Date Due   Zoster Vaccines- Shingrix (1 of 2) Never done   COVID-19 Vaccine (4 - Booster) 11/14/2021 (Originally 11/09/2020)   HEMOGLOBIN A1C  02/20/2022   FOOT EXAM  07/29/2022   OPHTHALMOLOGY EXAM  08/07/2022   MAMMOGRAM  01/07/2023   TETANUS/TDAP  10/10/2026   DEXA SCAN  03/12/2027   COLONOSCOPY (Pts 45-72yrs Insurance coverage will need to be confirmed)  01/15/2030   Pneumonia Vaccine 60+ Years old  Completed   INFLUENZA VACCINE  Completed   Hepatitis C Screening  Completed   HPV VACCINES  Aged Out    Health Maintenance  Health Maintenance Due  Topic Date Due   Zoster Vaccines- Shingrix (1 of 2) Never done    Colorectal cancer screening: Type of screening: Colonoscopy. Completed  . Repeat every 10 years  Mammogram status: Completed  . Repeat every year  Bone Density completed 2018  Lung Cancer Screening: (Low Dose CT Chest recommended if Age 65-80 years, 30 pack-year currently  smoking OR have quit w/in 15years.) does not qualify.   Lung Cancer Screening Referral:   Additional Screening:  Hepatitis C Screening: does not qualify; Completed 2017  Vision Screening: Recommended annual ophthalmology exams for early detection of glaucoma and other disorders of the eye. Is the patient up to date with their annual eye exam?  Yes  Who is the provider or what is the name of the office in which the patient attends annual eye exams? [facility] If pt is not established with a provider, would they like to be referred to a provider to establish care? No .   Dental Screening: Recommended annual dental exams for proper oral hygiene  Community Resource Referral / Chronic Care Management: CRR required this visit?  No   CCM required this visit?  No      Plan:     I have personally reviewed and noted the following in the patient's chart:   Medical and social history Use of alcohol, tobacco or illicit drugs  Current medications and supplements including opioid prescriptions.  Functional ability and status Nutritional status Physical activity Advanced directives List of other physicians Hospitalizations, surgeries, and ER visits in previous 12 months Vitals Screenings to include cognitive, depression, and falls Referrals and appointments  In addition, I have reviewed and discussed with patient certain preventive protocols, quality metrics, and best practice recommendations. A written personalized care plan for preventive services as well as general preventive health recommendations were provided to patient.     Leroy Kennedy, LPN   [id number]   Nurse Notes:

## 2021-11-12 NOTE — Telephone Encounter (Signed)
I spoke with Mrs Boom who states that she misunderstood that today's appt had been canceled.   Confirmed next appt on Thursday December 8th at [place]. Rutherford Nail M.S., [place], [place] Office [phone]

## 2021-11-13 ENCOUNTER — Encounter: Payer: Medicare Other | Admitting: Speech Pathology

## 2021-11-13 DIAGNOSIS — E1165 Type 2 diabetes mellitus with hyperglycemia: Secondary | ICD-10-CM

## 2021-11-13 DIAGNOSIS — I5032 Chronic diastolic (congestive) heart failure: Secondary | ICD-10-CM

## 2021-11-13 DIAGNOSIS — I10 Essential (primary) hypertension: Secondary | ICD-10-CM

## 2021-11-13 DIAGNOSIS — E1140 Type 2 diabetes mellitus with diabetic neuropathy, unspecified: Secondary | ICD-10-CM | POA: Diagnosis not present

## 2021-11-13 DIAGNOSIS — E785 Hyperlipidemia, unspecified: Secondary | ICD-10-CM

## 2021-11-13 DIAGNOSIS — E1169 Type 2 diabetes mellitus with other specified complication: Secondary | ICD-10-CM

## 2021-11-14 ENCOUNTER — Encounter: Payer: Medicare Other | Admitting: Speech Pathology

## 2021-11-14 DIAGNOSIS — Z794 Long term (current) use of insulin: Secondary | ICD-10-CM | POA: Diagnosis not present

## 2021-11-14 DIAGNOSIS — E1165 Type 2 diabetes mellitus with hyperglycemia: Secondary | ICD-10-CM | POA: Diagnosis not present

## 2021-11-21 ENCOUNTER — Ambulatory Visit: Payer: Medicare Other | Attending: Otolaryngology | Admitting: Speech Pathology

## 2021-11-21 ENCOUNTER — Other Ambulatory Visit: Payer: Self-pay

## 2021-11-21 DIAGNOSIS — R490 Dysphonia: Secondary | ICD-10-CM | POA: Diagnosis not present

## 2021-11-21 NOTE — Therapy (Signed)
[place] MAIN [facility] SERVICES [address] [place], [place], [address] Phone: [phone]   Fax:  [phone]  Speech Language Pathology Treatment RE-CERTIFICATION REQUEST Patient Details  Name: Tonya [name] MRN: [id number] Date of Birth: [birth year] Referring Provider (SLP): Carloyn Manner   Encounter Date: 11/21/2021   End of Session - 11/21/21 1703     Visit Number 12    Number of Visits 28    Date for SLP Re-Evaluation 01/16/22    Authorization Type [employer]    Authorization Time Period 11/06/2021 thru    Authorization - Visit Number 1    Progress Note Due on Visit 10    SLP Start Time 0900    SLP Stop Time  1000    SLP Time Calculation (min) 60 min    Activity Tolerance Patient tolerated treatment well             Past Medical History:  Diagnosis Date   (HFpEF) heart failure with preserved ejection fraction ([place])    a. 05/2017 Echo: EF 55-60%, Gr1 DD, mildly dil LA.   CKD (chronic kidney disease), stage III ([place])    Diabetes mellitus without complication ([place])    Hyperlipidemia    Hypertension    Stroke ([place])    a. 08/2017.   Wears dentures    partial upper    Past Surgical History:  Procedure Laterality Date   CATARACT EXTRACTION W/PHACO Left 02/05/2021   Procedure: CATARACT EXTRACTION PHACO AND INTRAOCULAR LENS PLACEMENT (IOC) LEFT DIABETIC 5.47 00:51.4;  Surgeon: Birder Robson, MD;  Location: [place];  Service: Ophthalmology;  Laterality: Left;  Diabetic - insulin and oral meds   CATARACT EXTRACTION W/PHACO Right 03/19/2021   Procedure: CATARACT EXTRACTION PHACO AND INTRAOCULAR LENS PLACEMENT (IOC) RIGHT DIABETIC 6.09 00:39.6;  Surgeon: Birder Robson, MD;  Location: [place];  Service: Ophthalmology;  Laterality: Right;   COLONOSCOPY  12/15/2006   COLONOSCOPY WITH PROPOFOL N/A 06/06/2019   Procedure: COLONOSCOPY WITH PROPOFOL;  Surgeon: [name], MD;  Location: [facility]  ENDOSCOPY;  Service: Gastroenterology;  Laterality: N/A;   COLONOSCOPY WITH PROPOFOL N/A 01/16/2020   Procedure: COLONOSCOPY WITH BIOPSY;  Surgeon: [name], MD;  Location: [place];  Service: Endoscopy;  Laterality: N/A;  Diabetic - insulin and oral meds   POLYPECTOMY N/A 01/16/2020   Procedure: POLYPECTOMY;  Surgeon: [name], MD;  Location: [place];  Service: Endoscopy;  Laterality: N/A;   TUBAL LIGATION      There were no vitals filed for this visit.   Subjective Assessment - 11/21/21 1700     Subjective "I get so tired of people asking me - you just waking up? were you sleeping? do you have a cold?"    Currently in Pain? No/denies                   ADULT SLP TREATMENT - 11/21/21 0001       Treatment Provided   Treatment provided Cognitive-Linquistic      Cognitive-Linquistic Treatment   Treatment focused on Voice;Patient/family/caregiver education    Skilled Treatment Information from ENT appt discussed with pt, Perceptual Voice Evaluation readministered as well as the Voice Handicap Index. For results, see clinical impressions statement.              SLP Education - 11/21/21 1702     Education Details results of evaluation, ST POC    Person(s) Educated Patient    Methods Explanation;Demonstration;Verbal cues  Comprehension Verbalized understanding;Need further instruction              SLP Short Term Goals - 11/21/21 1704       SLP SHORT TERM GOAL #1   Title The patient will decrease laryngeal and articulatory muscle tension by independently completing relaxation/stretching exercises.    Time 10    Period --   sessions   Status On-going      SLP SHORT TERM GOAL #2   Title The patient will eliminate phonotraumatic behaviors such as chronic throat clearing, by substituting non-traumatic methods to clear mucus.    Status Achieved      SLP SHORT TERM GOAL #3   Title The patient will increase hydration for an eventual  goal of 6-8 glasses per day and limit caffeine intake (to maximum of 1-2, 8 oz cups/day), as measured by patient report.    Baseline 16 oz    Time 10    Period --   sessions   Status On-going      SLP SHORT TERM GOAL #4   Title The patient will complete vocal function exercises without laryngeal tension.    Baseline new goal    Time 10    Period --   sessions   Status New              SLP Long Term Goals - 11/21/21 1707       SLP LONG TERM GOAL #1   Title The patient will increase hydration for an eventual goal of 6-8 glasses per day and limit caffeine intake (to maximum of 1-2, 8 oz cups/day), as measured by patient report.    Baseline increased to 16 oz    Time 8    Period Weeks    Status On-going    Target Date 01/16/22      SLP LONG TERM GOAL #2   Title The patient will demonstrate independent understanding of vocal hygiene concepts by answering [facility] questions with 90% accuracy.    Baseline 50% with moderate cues    Time 8    Period Weeks    Status On-going    Target Date 01/16/22      SLP LONG TERM GOAL #3   Title The patient will eliminate phonotraumatic behaviors such as chronic throat clearing, by substituting non-traumatic methods to clear mucus.    Baseline progressed, intermittent, much reduced    Time 8    Period Weeks    Status On-going    Target Date 01/16/22      SLP LONG TERM GOAL #4   Title The patient will maximize voice quality and loudness using breath support/oral resonance for sustained vowel production, pitch glides, and hierarchal speech drill.    Baseline loudness much improved to 79dB, pitch improved to 214 Hz    Time 8    Period Weeks    Status On-going    Target Date 01/16/22              Plan - 11/21/21 1704     Clinical Impression Statement While pt is making slower than expected progress towards goals, her last appt with ENT on  11/01/2021 revealed very mild bowing and intermittent muscle tension dysphonia. This appears  improved from findings on 07/31/2021 of bilateral muscle tension dysphonia with touching of false vocal fold with phonation and mild bilateral bowing of vocal folds. In addition, pt has successfully eliminated habitual throat clears and coughing. To objectively compare progress, the Perceptual Voice Evaluation was  readministered with improvements noted in pitch, pitch range and vocal intensity. Pt continues to have impairments in respiratory support and coordination of respiration with phonation. Pt has history of sarcoidosis, and she reports in the last 6 months to 1 year she is continually SOB. Will reach out to referring ENT for possible consult to pulmonology. Given pt's progress, I recommend skilled ST to train pt in vocal hygiene, improve breath support for voice and reduce laryngeal tension in order to improve vocal quality, endurance, and quality of life.   Objective Voice Measurements Maximum phonation time for sustained "ah": 4.5 seconds Average fundamental frequency during sustained "ah": 214 Hz (1.5 SD below average 244 Hz + 27 for gender) Habitual pitch: 184 Hz Highest dynamic pitch when altering pitch from a low note to a high note: 165 Hz Lowest dynamic pitch when altering from a high note to a low note: 145 Hz Highest dynamic pitch in conversational speech: 132 Hz  Lowest dynamic pitch in conversational speech: 275 Hz Average time patient was able to sustain /s/: 5.5 seconds Average time patient was able to sustain /z/: 4.13 seconds s/z ratio : 1.33 (suggestive of dysfunction>1.0)  VHI Score:  The Voice Handicap Index is comprised of a series of questions to assess the patient's perception of their voice. It is designed to evaluate the emotional, physical and functional components of the voice problem.   Functional: 5 (mild) Physical: 20 (mild to moderate) Emotional: 21 (mild to moderate) Total:  z score =  2.5  moderate = 2.00-2.99     Speech Therapy Frequency 2x / week     Duration 8 weeks    Treatment/Interventions SLP instruction and feedback;Patient/family education    Potential to Achieve Goals Good    Potential Considerations Ability to learn/carryover information;Severity of impairments    Consulted and Agree with Plan of Care Patient             Patient will benefit from skilled therapeutic intervention in order to improve the following deficits and impairments:   Dysphonia    Problem List Patient Active Problem List   Diagnosis Date Noted   Neuropathic pain 06/07/2020   Lumbar degenerative disc disease 06/07/2020   Lumbar spondylosis 06/07/2020   Chronic pain syndrome 04/18/2020   Sacroiliac joint pain 04/18/2020   Chronic heart failure with preserved ejection fraction (HFpEF) ([place]) 01/11/2020   Grade I diastolic dysfunction 01/77/9390   History of CVA (cerebrovascular accident) 12/01/2019   Hyperparathyroidism due to renal insufficiency ([place]) 08/31/2019   Anemia in CKD (chronic kidney disease) 02/22/2019   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 Rutherford Nail M.S., CCC-SLP, [facility] Speech-Language Pathologist Rehabilitation Services Office [phone]  Stormy Fabian, [place] 11/21/2021, 5:14 PM  [place] MAIN [facility] SERVICES [address] [place], [place], [address] Phone: [phone]   Fax:  [phone]   Name: Tonya [name] MRN: [id number] Date of Birth: [birth year]

## 2021-11-22 DIAGNOSIS — E1121 Type 2 diabetes mellitus with diabetic nephropathy: Secondary | ICD-10-CM | POA: Diagnosis not present

## 2021-11-22 DIAGNOSIS — Z794 Long term (current) use of insulin: Secondary | ICD-10-CM | POA: Diagnosis not present

## 2021-11-26 ENCOUNTER — Telehealth: Payer: Medicare Other | Admitting: General Practice

## 2021-11-26 ENCOUNTER — Ambulatory Visit: Payer: Self-pay

## 2021-11-26 DIAGNOSIS — M792 Neuralgia and neuritis, unspecified: Secondary | ICD-10-CM

## 2021-11-26 DIAGNOSIS — G894 Chronic pain syndrome: Secondary | ICD-10-CM

## 2021-11-26 DIAGNOSIS — I10 Essential (primary) hypertension: Secondary | ICD-10-CM

## 2021-11-26 DIAGNOSIS — I5032 Chronic diastolic (congestive) heart failure: Secondary | ICD-10-CM

## 2021-11-26 DIAGNOSIS — R490 Dysphonia: Secondary | ICD-10-CM

## 2021-11-26 DIAGNOSIS — E1140 Type 2 diabetes mellitus with diabetic neuropathy, unspecified: Secondary | ICD-10-CM

## 2021-11-26 DIAGNOSIS — E1169 Type 2 diabetes mellitus with other specified complication: Secondary | ICD-10-CM

## 2021-11-26 DIAGNOSIS — E785 Hyperlipidemia, unspecified: Secondary | ICD-10-CM

## 2021-11-26 NOTE — Patient Instructions (Signed)
Visit Information  Thank you for taking time to visit with me today. Please don't hesitate to contact me if I can be of assistance to you before our next scheduled telephone appointment.  Following are the goals we discussed today:  RNCM Clinical Goal(s):  Patient will verbalize understanding of plan for management of CHF, HTN, HLD, DMII, and chronic pain  as evidenced by seeing providers on a regular basis, taking medications as ordered, following plan of care and calling offie for changes take all medications exactly as prescribed and will call provider for medication related questions as evidenced by compliance and calling the pharmacy before running out of medications     attend all scheduled medical appointments: 01-29-2022, has specialist appointments coming up as evidenced by keeping appointments and calling the office for changes that need to be made if unable to make appointment         demonstrate improved and ongoing health management independence as evidenced by working with the CCM team to optimize health and well being        demonstrate a decrease in CHF, HTN, HLD, DMII, and Chronic pain  exacerbations  as evidenced by following plan of care, working with the CCM team to effectively manage health and well being  demonstrate ongoing self health care management ability for effective management of chronic conditions  as evidenced by  working with the CCM team through collaboration with Consulting civil engineer, provider, and care team.    Interventions: 1:1 collaboration with primary care provider regarding development and update of comprehensive plan of care as evidenced by provider attestation and co-signature Inter-disciplinary care team collaboration (see longitudinal plan of care) Evaluation of current treatment plan related to  self management and patient's adherence to plan as established by provider     SDOH Barriers (Status: Goal on Track (progressing): YES.) Long Term Goal  Patient  interviewed and SDOH assessment performed        Patient interviewed and appropriate assessments performed Provided patient with information about resources available and to call the office for changes in SDOH or new needs  Discussed plans with patient for ongoing care management follow up and provided patient with direct contact information for care management team Advised patient to call the office for changes in SDOH, questions, or concerns       Heart Failure Interventions:  (Status: Goal on Track (progressing): YES.)  Long Term Goal  Basic overview and discussion of pathophysiology of Heart Failure reviewed Provided education on low sodium diet Reviewed Heart Failure Action Plan in depth and provided written copy Assessed need for readable accurate scales in home Provided education about placing scale on hard, flat surface Advised patient to weigh each morning after emptying bladder Discussed importance of daily weight and advised patient to weigh and record daily Reviewed role of diuretics in prevention of fluid overload and management of heart failure Discussed the importance of keeping all appointments with provider. 11-26-2021: Saw pcp recently and will see specialist soon for further evaluation of raspy voice, has been working with speech therapy. Denies any issues with HF. Will have a pulmonary appointment coming up for further evaluation of raspy voice as the speech therapist feels it has something to do with pulmonary. Provided patient with education about the role of exercise in the management of heart failure Advised patient to discuss HF and heart health with provider Screening for signs and symptoms of depression related to chronic disease state  Assessed social determinant of health barriers  Evaluation of edema and wearing compression stockings. The patient states she is not using these. Encouraged by the pcp to start wearing. RNCM recommended trying the [employer]  compression socks for a different alternative to the compression hose. Wrote down the information for the patient and where to look into getting these socks. Encouraged the patient that this would also help with her neuropathy. 11-26-2021: The patient denies any swelling in her feet and legs. States her HF is stable. Will continue to monitor.   Diabetes:  (Status: Goal on Track (progressing): YES.) Long Term Goal         Lab Results  Component Value Date    HGBA1C 8.2 (H) 01/29/2021  08-30-2021: 9.1 at [place] patient's understanding of A1c goal: <7% Provided education to patient about basic DM disease process; Reviewed medications with patient and discussed importance of medication adherence;        Reviewed prescribed diet with patient heart healthy/ADA; Counseled on importance of regular laboratory monitoring as prescribed;        Discussed plans with patient for ongoing care management follow up and provided patient with direct contact information for care management team;      Provided patient with written educational materials related to hypo and hyperglycemia and importance of correct treatment. 10-29-2021: The patient had a low of 40 and 55 on 10-21-2021: Has had 7 lows over the last 90 days. Education on keeping gel with her in her pocket book and at home for lows. The patient does eat peanut butter and drinks something that will help bring it back up when she is at home. Education and support given.   11-26-2021: Denies any lows at this time. States her blood sugars are consistently around 112.      Reviewed scheduled/upcoming provider appointments including: 01-29-2022, has upcoming specialist appointments ;         Advised patient, providing education and rationale, to check cbg as directed, the patient has the freestyle [place]  and record . 10-29-2021: Average has been 186. The patients range has been 40 on 11-7 to 273.  Extensive education from the pcp today to discuss  restarting long acting insulin.  The patient verbalized understanding       call provider for findings outside established parameters. 11-26-2021: States that her blood sugars have been consistently 112 in the ams. Denies any elevations or lows;       Review of patient status, including review of consultants reports, relevant laboratory and other test results, and medications completed;       Advised patient to discuss medications  with provider.  The patient has not been taking her long acting insulin but has her short acting and is experiencing some lows. Education and support given      Evaluation of neuropathy pain. The patient sees Dr. Holley Raring as she cannot tolerate gabapentin or lyricia. Cymbalta did not offer any relief either. Pcp is going to discuss capzasin cream wraps to toes with Dr. Holley Raring as this is something he is trying in the office now to help with pain relief from neuropathy.    Hyperlipidemia:  (Status: Goal on Track (progressing): YES.) Long Term Goal       Lab Results  Component Value Date    CHOL 172 10/29/2021    HDL 70 10/29/2021    LDLCALC 87 10/29/2021    LDLDIRECT 67 12/30/2018    TRIG 83 10/29/2021    CHOLHDL 2.5 12/02/2019    Medication review  performed; medication list updated in electronic medical record.  Provider established cholesterol goals reviewed. 11-26-2021: The patient has WNL lab work. The patient is doing well with managing her cholesterol at this time Counseled on importance of regular laboratory monitoring as prescribed; Provided HLD educational materials; Reviewed role and benefits of statin for ASCVD risk reduction; Discussed strategies to manage statin-induced myalgias; Reviewed importance of limiting foods high in cholesterol;   Hypertension: (Status: Goal on Track (progressing): YES.) Last practice recorded BP readings:     BP Readings from Last 3 Encounters:  10/29/21 138/76  09/02/21 (!) 151/82  08/08/21 (!) 167/89  Most recent  eGFR/CrCl: No results found for: EGFR  No components found for: CRCL   Evaluation of current treatment plan related to hypertension self management and patient's adherence to plan as established by provider. 11-26-2021: The patient feels her blood pressure is better controlled. She denies any acute findings related to HTN health.   Provided education to patient re: stroke prevention, s/s of heart attack and stroke; Reviewed prescribed diet heart healthy/ADA diet  Reviewed medications with patient and discussed importance of compliance;  Counseled on adverse effects of illicit drug and excessive alcohol use in patients with high blood pressure;  Discussed plans with patient for ongoing care management follow up and provided patient with direct contact information for care management team; Advised patient, providing education and rationale, to monitor blood pressure daily and record, calling PCP for findings outside established parameters;  Advised patient to discuss blood pressure trends  with provider; Provided education on prescribed diet heart healthy/ADA diet ;  Discussed complications of poorly controlled blood pressure such as heart disease, stroke, circulatory complications, vision complications, kidney impairment, sexual dysfunction;    Pain:  (Status: Goal on Track (progressing): YES.) Long Term Goal  Pain assessment performed. 11-26-2021: States "0" pain at the time of the call. When she is active and over does it it can be up to a 10.  Medications reviewed. 11-26-2021: Is compliant with medications regimen  Reviewed provider established plan for pain management; Discussed importance of adherence to all scheduled medical appointments; Counseled on the importance of reporting any/all new or changed pain symptoms or management strategies to pain management provider; Advised patient to report to care team affect of pain on daily activities; Discussed use of relaxation techniques and/or  diversional activities to assist with pain reduction (distraction, imagery, relaxation, massage, acupressure, TENS, heat, and cold application. 10-29-2021: The patient uses CBD oil and uses a spray to help with relaxing her and that has been effective in helping her rest. The pain is were she had shingles and also OA. The patient had an injection from the pain clinic but only was effective for a short period of time. The patient states that she is also have neuropathy pain in toes. The patient cannot tolerate gabapentin or Lyrica. The pcp to collaborate with pain provider. Discussed capzasin cream wraps for toes to see if that would help with neuropathy pain. The patient is open to recommendations  Reviewed with patient prescribed pharmacological and nonpharmacological pain relief strategies; Advised patient to discuss unresolved pain, changes in intensity or level of pain  with provider;     Patient Goals/Self-Care Activities: Patient will self administer medications as prescribed as evidenced by self report/primary caregiver report  Patient will attend all scheduled provider appointments as evidenced by clinician review of documented attendance to scheduled appointments and patient/caregiver report Patient will call pharmacy for medication refills as evidenced by patient report  and review of pharmacy fill history as appropriate Patient will attend church or other social activities as evidenced by patient report Patient will continue to perform ADL's independently as evidenced by patient/caregiver report Patient will continue to perform IADL's independently as evidenced by patient/caregiver report Patient will call provider office for new concerns or questions as evidenced by review of documented incoming telephone call notes and patient report Patient will work with BSW to address care coordination needs and will continue to work with the clinical team to address health care and disease management  related needs as evidenced by documented adherence to scheduled care management/care coordination appointments call office if I gain more than 2 pounds in one day or 5 pounds in one week keep legs up while sitting track weight in diary use salt in moderation watch for swelling in feet, ankles and legs every day weigh myself daily develop a rescue plan follow rescue plan if symptoms flare-up eat more whole grains, fruits and vegetables, lean meats and healthy fats know when to call the doctorfor changes in HF, questions, or concerns track symptoms and what helps feel better or worse dress right for the weather, hot or cold schedule appointment with eye doctor check blood sugar at prescribed times: when you have symptoms of low or high blood sugar, before and after exercise, and has freestyle libre and can check blood sugars frequently  check feet daily for cuts, sores or redness enter blood sugar readings and medication or insulin into daily log take the blood sugar meter to all doctor visits trim toenails straight across drink 6 to 8 glasses of water each day eat fish at least once per week fill half of plate with vegetables limit fast food meals to no more than 1 per week manage portion size prepare main meal at home 3 to 5 days each week read food labels for fat, fiber, carbohydrates and portion size reduce red meat to 2 to 3 times a week keep feet up while sitting wash and dry feet carefully every day wear comfortable, cotton socks wear comfortable, well-fitting shoes - check blood pressure daily - choose a place to take my blood pressure (home, clinic or office, retail store) - write blood pressure results in a log or diary - learn about high blood pressure - keep a blood pressure log - take blood pressure log to all doctor appointments - call doctor for signs and symptoms of high blood pressure - develop an action plan for high blood pressure - keep all doctor  appointments - take medications for blood pressure exactly as prescribed - report new symptoms to your doctor - eat more whole grains, fruits and vegetables, lean meats and healthy fats - call for medicine refill 2 or 3 days before it runs out - take all medications exactly as prescribed - call doctor with any symptoms you believe are related to your medicine - call doctor when you experience any new symptoms - go to all doctor appointments as scheduled - adhere to prescribed diet: heart healthy/ADA diet       Our next appointment is by telephone on 02-04-2021 at 1 pm  Please call the care guide team at [phone] if you need to cancel or reschedule your appointment.   If you are experiencing a Mental Health or [place] or need someone to talk to, please call the Suicide and Crisis Lifeline: 988 call the [place] National Suicide Prevention Lifeline: 520-506-4403 or TTY: 409 277 6504 TTY (762)722-9429) to talk to  a trained counselor call 1-800-273-TALK (toll free, 24 hour hotline)   Patient verbalizes understanding of instructions provided today and agrees to view in [place].   [name] RN, MSN, [facility] Mobile: [phone]

## 2021-11-26 NOTE — Chronic Care Management (AMB) (Signed)
Chronic Care Management   CCM RN Visit Note  11/26/2021 Name: Tonya [name] MRN: [id number] DOB: [birth year]  Subjective: Tonya [name] is a 75 y.o. year old female who is a primary care patient of Cannady, [name], NP. The care management team was consulted for assistance with disease management and care coordination needs.    Engaged with patient by telephone for follow up visit in response to provider referral for case management and/or care coordination services.   Consent to Services:  The patient was given information about Chronic Care Management services, agreed to services, and gave verbal consent prior to initiation of services.  Please see initial visit note for detailed documentation.   Patient agreed to services and verbal consent obtained.   Assessment: Review of patient past medical history, allergies, medications, health status, including review of consultants reports, laboratory and other test data, was performed as part of comprehensive evaluation and provision of chronic care management services.   SDOH (Social Determinants of Health) assessments and interventions performed:    CCM Care Plan  Allergies  Allergen Reactions   Atorvastatin     Myalgias    Gabapentin Other (See Comments)    Speech impairment    Pravastatin     Myalgias    Pregabalin     Speech impairment   Trulicity [Dulaglutide] Hives    Outpatient Encounter Medications as of 11/26/2021  Medication Sig   aspirin 81 MG tablet Take 81 mg by mouth daily.   carvedilol (COREG) 3.125 MG tablet TAKE 1 TABLET BY MOUTH  TWICE DAILY WITH A MEAL   clopidogrel (PLAVIX) 75 MG tablet TAKE 1 TABLET BY MOUTH  DAILY   docusate sodium (COLACE) 100 MG capsule Take 100 mg by mouth daily as needed for mild constipation.   ferrous sulfate 325 (65 FE) MG tablet Take 325 mg by mouth daily with breakfast.   HUMALOG KWIKPEN 100 UNIT/ML KiwkPen 6 Units. Take 6 units before breakfast, lunch, supper if glucose  greater than 70   Insulin Glargine (BASAGLAR KWIKPEN [place]) Inject 8 Units into the skin daily.   NOVOFINE 32G X 6 MM MISC USE AS DIRECTED THREE TIMES DAILY WITH HUMALOG   olmesartan (BENICAR) 40 MG tablet Take 1 tablet (40 mg total) by mouth daily.   omeprazole (PRILOSEC) 20 MG capsule TAKE 1 CAPSULE BY MOUTH  TWICE DAILY   pioglitazone (ACTOS) 30 MG tablet Take 30 mg by mouth daily.   rosuvastatin (CRESTOR) 5 MG tablet TAKE 1 TABLET BY MOUTH  DAILY   Torsemide 40 MG TABS Take by mouth.   vitamin B-12 (CYANOCOBALAMIN) 100 MCG tablet Take 100 mcg by mouth daily.   Vitamin D, Cholecalciferol, 50 MCG (2000 UT) CAPS Take 2,000 Units by mouth daily.   No facility-administered encounter medications on file as of 11/26/2021.    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[place]) 09/22/2017   Advanced care planning/counseling discussion 04/13/2017   Post herpetic neuralgia 10/10/2016   Hip bursitis 09/18/2015  Poorly controlled type 2 diabetes mellitus with neuropathy ([place]) 05/01/2015   Hyperlipidemia associated with type 2 diabetes mellitus ([place]) 05/01/2015   Chronic kidney disease, stage 4, severely decreased GFR ([place]) 05/01/2015   Encounter for long-term (current) use of insulin ([place]) 05/01/2015    Conditions to be addressed/monitored:CHF, HTN, HLD, DMII, and Chronic pain  Care Plan : RNCM; General Plan of Care (Adult) for Chronic Disease Management and Care  Coordination Needs  Updates made by Vanita Ingles, RN since 11/26/2021 12:00 AM     Problem: RNCM: Development for Plan of Care for Chronic Disease Management (DM, HTN, HLD, HF, Chronic Pain)   Priority: High     Long-Range Goal: RNCM: Effective Management for Plan of Care for Chronic Disease Management (DM, HTN, HLD, HF, Chronic Pain)   Start Date: 10/29/2021  Expected End Date: 10/29/2022  Priority: High  Note:   Current Barriers:  Knowledge Deficits related to plan of care for management of CHF, HTN, HLD, DMII, and Chronic Pain   Chronic Disease Management support and education needs related to CHF, HTN, HLD, DMII, and chronic pain   RNCM Clinical Goal(s):  Patient will verbalize understanding of plan for management of CHF, HTN, HLD, DMII, and chronic pain  as evidenced by seeing providers on a regular basis, taking medications as ordered, following plan of care and calling offie for changes take all medications exactly as prescribed and will call provider for medication related questions as evidenced by compliance and calling the pharmacy before running out of medications     attend all scheduled medical appointments: 01-29-2022, has specialist appointments coming up as evidenced by keeping appointments and calling the office for changes that need to be made if unable to make appointment         demonstrate improved and ongoing health management independence as evidenced by working with the CCM team to optimize health and well being        demonstrate a decrease in CHF, HTN, HLD, DMII, and Chronic pain  exacerbations  as evidenced by following plan of care, working with the CCM team to effectively manage health and well being  demonstrate ongoing self health care management ability for effective management of chronic conditions  as evidenced by  working with the CCM team through collaboration with Consulting civil engineer, provider, and care team.   Interventions: 1:1 collaboration with primary  care provider regarding development and update of comprehensive plan of care as evidenced by provider attestation and co-signature Inter-disciplinary care team collaboration (see longitudinal plan of care) Evaluation of current treatment plan related to  self management and patient's adherence to plan as established by provider   SDOH Barriers (Status: Goal on Track (progressing): YES.) Long Term Goal  Patient interviewed and SDOH assessment performed        Patient interviewed and appropriate assessments performed Provided patient with information about resources available and to call the office for changes in SDOH or new needs  Discussed plans with patient for ongoing care management follow up and provided patient with direct contact information for care management team Advised patient to call the office for changes in [place], questions, or concerns    Heart Failure Interventions:  (Status: Goal on Track (progressing): YES.)  Long Term Goal  Basic overview and discussion of pathophysiology of Heart Failure reviewed Provided education on low sodium diet Reviewed Heart Failure Action Plan in depth and provided written copy Assessed need for readable accurate scales in home Provided education about placing  scale on hard, flat surface Advised patient to weigh each morning after emptying bladder Discussed importance of daily weight and advised patient to weigh and record daily Reviewed role of diuretics in prevention of fluid overload and management of heart failure Discussed the importance of keeping all appointments with provider. 11-26-2021: Saw pcp recently and will see specialist soon for further evaluation of raspy voice, has been working with speech therapy. Denies any issues with HF. Will have a pulmonary appointment coming up for further evaluation of raspy voice as the speech therapist feels it has something to do with pulmonary. Provided patient with education about the role of  exercise in the management of heart failure Advised patient to discuss HF and heart health with provider Screening for signs and symptoms of depression related to chronic disease state  Assessed social determinant of health barriers Evaluation of edema and wearing compression stockings. The patient states she is not using these. Encouraged by the pcp to start wearing. RNCM recommended trying the [employer] compression socks for a different alternative to the compression hose. Wrote down the information for the patient and where to look into getting these socks. Encouraged the patient that this would also help with her neuropathy. 11-26-2021: The patient denies any swelling in her feet and legs. States her HF is stable. Will continue to monitor.  Diabetes:  (Status: Goal on Track (progressing): YES.) Long Term Goal   Lab Results  Component Value Date   HGBA1C 8.2 (H) 01/29/2021  08-30-2021: 9.1 at [place] patient's understanding of A1c goal: <7% Provided education to patient about basic DM disease process; Reviewed medications with patient and discussed importance of medication adherence;        Reviewed prescribed diet with patient heart healthy/ADA; Counseled on importance of regular laboratory monitoring as prescribed;        Discussed plans with patient for ongoing care management follow up and provided patient with direct contact information for care management team;      Provided patient with written educational materials related to hypo and hyperglycemia and importance of correct treatment. 10-29-2021: The patient had a low of 40 and 55 on 10-21-2021: Has had 7 lows over the last 90 days. Education on keeping gel with her in her pocket book and at home for lows. The patient does eat peanut butter and drinks something that will help bring it back up when she is at home. Education and support given.   11-26-2021: Denies any lows at this time. States her blood sugars are  consistently around 112.      Reviewed scheduled/upcoming provider appointments including: 01-29-2022, has upcoming specialist appointments ;         Advised patient, providing education and rationale, to check cbg as directed, the patient has the freestyle [place]  and record . 10-29-2021: Average has been 186. The patients range has been 40 on 11-7 to 273.  Extensive education from the pcp today to discuss restarting long acting insulin.  The patient verbalized understanding       call provider for findings outside established parameters. 11-26-2021: States that her blood sugars have been consistently 112 in the ams. Denies any elevations or lows;       Review of patient status, including review of consultants reports, relevant laboratory and other test results, and medications completed;       Advised patient to discuss medications  with provider.  The patient has not been taking her long acting insulin but has her  short acting and is experiencing some lows. Education and support given      Evaluation of neuropathy pain. The patient sees Dr. Holley Raring as she cannot tolerate gabapentin or lyricia. Cymbalta did not offer any relief either. Pcp is going to discuss capzasin cream wraps to toes with Dr. Holley Raring as this is something he is trying in the office now to help with pain relief from neuropathy.   Hyperlipidemia:  (Status: Goal on Track (progressing): YES.) Long Term Goal  Lab Results  Component Value Date   CHOL 172 10/29/2021   HDL 70 10/29/2021   LDLCALC 87 10/29/2021   LDLDIRECT 67 12/30/2018   TRIG 83 10/29/2021   CHOLHDL 2.5 12/02/2019   Medication review performed; medication list updated in electronic medical record.  Provider established cholesterol goals reviewed. 11-26-2021: The patient has WNL lab work. The patient is doing well with managing her cholesterol at this time Counseled on importance of regular laboratory monitoring as prescribed; Provided HLD educational  materials; Reviewed role and benefits of statin for ASCVD risk reduction; Discussed strategies to manage statin-induced myalgias; Reviewed importance of limiting foods high in cholesterol;  Hypertension: (Status: Goal on Track (progressing): YES.) Last practice recorded BP readings:  BP Readings from Last 3 Encounters:  10/29/21 138/76  09/02/21 (!) 151/82  08/08/21 (!) 167/89  Most recent eGFR/CrCl: No results found for: EGFR  No components found for: CRCL  Evaluation of current treatment plan related to hypertension self management and patient's adherence to plan as established by provider. 11-26-2021: The patient feels her blood pressure is better controlled. She denies any acute findings related to HTN health.   Provided education to patient re: stroke prevention, s/s of heart attack and stroke; Reviewed prescribed diet heart healthy/ADA diet  Reviewed medications with patient and discussed importance of compliance;  Counseled on adverse effects of illicit drug and excessive alcohol use in patients with high blood pressure;  Discussed plans with patient for ongoing care management follow up and provided patient with direct contact information for care management team; Advised patient, providing education and rationale, to monitor blood pressure daily and record, calling PCP for findings outside established parameters;  Advised patient to discuss blood pressure trends  with provider; Provided education on prescribed diet heart healthy/ADA diet ;  Discussed complications of poorly controlled blood pressure such as heart disease, stroke, circulatory complications, vision complications, kidney impairment, sexual dysfunction;   Pain:  (Status: Goal on Track (progressing): YES.) Long Term Goal  Pain assessment performed. 11-26-2021: States "0" pain at the time of the call. When she is active and over does it it can be up to a 10.  Medications reviewed. 11-26-2021: Is compliant with  medications regimen  Reviewed provider established plan for pain management; Discussed importance of adherence to all scheduled medical appointments; Counseled on the importance of reporting any/all new or changed pain symptoms or management strategies to pain management provider; Advised patient to report to care team affect of pain on daily activities; Discussed use of relaxation techniques and/or diversional activities to assist with pain reduction (distraction, imagery, relaxation, massage, acupressure, TENS, heat, and cold application. 10-29-2021: The patient uses CBD oil and uses a spray to help with relaxing her and that has been effective in helping her rest. The pain is were she had shingles and also OA. The patient had an injection from the pain clinic but only was effective for a short period of time. The patient states that she is also have neuropathy pain in  toes. The patient cannot tolerate gabapentin or Lyrica. The pcp to collaborate with pain provider. Discussed capzasin cream wraps for toes to see if that would help with neuropathy pain. The patient is open to recommendations  Reviewed with patient prescribed pharmacological and nonpharmacological pain relief strategies; Advised patient to discuss unresolved pain, changes in intensity or level of pain  with provider;   Patient Goals/Self-Care Activities: Patient will self administer medications as prescribed as evidenced by self report/primary caregiver report  Patient will attend all scheduled provider appointments as evidenced by clinician review of documented attendance to scheduled appointments and patient/caregiver report Patient will call pharmacy for medication refills as evidenced by patient report and review of pharmacy fill history as appropriate Patient will attend church or other social activities as evidenced by patient report Patient will continue to perform ADL's independently as evidenced by patient/caregiver  report Patient will continue to perform IADL's independently as evidenced by patient/caregiver report Patient will call provider office for new concerns or questions as evidenced by review of documented incoming telephone call notes and patient report Patient will work with BSW to address care coordination needs and will continue to work with the clinical team to address health care and disease management related needs as evidenced by documented adherence to scheduled care management/care coordination appointments call office if I gain more than 2 pounds in one day or 5 pounds in one week keep legs up while sitting track weight in diary use salt in moderation watch for swelling in feet, ankles and legs every day weigh myself daily develop a rescue plan follow rescue plan if symptoms flare-up eat more whole grains, fruits and vegetables, lean meats and healthy fats know when to call the doctorfor changes in HF, questions, or concerns track symptoms and what helps feel better or worse dress right for the weather, hot or cold schedule appointment with eye doctor check blood sugar at prescribed times: when you have symptoms of low or high blood sugar, before and after exercise, and has freestyle libre and can check blood sugars frequently  check feet daily for cuts, sores or redness enter blood sugar readings and medication or insulin into daily log take the blood sugar meter to all doctor visits trim toenails straight across drink 6 to 8 glasses of water each day eat fish at least once per week fill half of plate with vegetables limit fast food meals to no more than 1 per week manage portion size prepare main meal at home 3 to 5 days each week read food labels for fat, fiber, carbohydrates and portion size reduce red meat to 2 to 3 times a week keep feet up while sitting wash and dry feet carefully every day wear comfortable, cotton socks wear comfortable, well-fitting shoes - check  blood pressure daily - choose a place to take my blood pressure (home, clinic or office, retail store) - write blood pressure results in a log or diary - learn about high blood pressure - keep a blood pressure log - take blood pressure log to all doctor appointments - call doctor for signs and symptoms of high blood pressure - develop an action plan for high blood pressure - keep all doctor appointments - take medications for blood pressure exactly as prescribed - report new symptoms to your doctor - eat more whole grains, fruits and vegetables, lean meats and healthy fats - call for medicine refill 2 or 3 days before it runs out - take all medications exactly as prescribed -  call doctor with any symptoms you believe are related to your medicine - call doctor when you experience any new symptoms - go to all doctor appointments as scheduled - adhere to prescribed diet: heart healthy/ADA diet        Plan:Telephone follow up appointment with care management team member scheduled for:  02-04-2021 at 1 pm  [place], MSN, [facility] Mobile: [phone]

## 2021-11-28 ENCOUNTER — Ambulatory Visit: Payer: Medicare Other | Admitting: Speech Pathology

## 2021-12-03 ENCOUNTER — Ambulatory Visit: Payer: Medicare Other | Admitting: Speech Pathology

## 2021-12-05 ENCOUNTER — Ambulatory Visit: Payer: Medicare Other | Admitting: Speech Pathology

## 2021-12-10 ENCOUNTER — Ambulatory Visit: Payer: Medicare Other | Admitting: Speech Pathology

## 2021-12-12 ENCOUNTER — Ambulatory Visit: Payer: Medicare Other | Admitting: Speech Pathology

## 2021-12-17 ENCOUNTER — Encounter: Payer: Medicare Other | Admitting: Speech Pathology

## 2021-12-19 ENCOUNTER — Encounter: Payer: Medicare Other | Admitting: Speech Pathology

## 2021-12-19 DIAGNOSIS — E1129 Type 2 diabetes mellitus with other diabetic kidney complication: Secondary | ICD-10-CM | POA: Diagnosis not present

## 2021-12-19 DIAGNOSIS — E1121 Type 2 diabetes mellitus with diabetic nephropathy: Secondary | ICD-10-CM | POA: Diagnosis not present

## 2021-12-19 DIAGNOSIS — E1165 Type 2 diabetes mellitus with hyperglycemia: Secondary | ICD-10-CM | POA: Diagnosis not present

## 2021-12-19 DIAGNOSIS — E113393 Type 2 diabetes mellitus with moderate nonproliferative diabetic retinopathy without macular edema, bilateral: Secondary | ICD-10-CM | POA: Diagnosis not present

## 2021-12-19 DIAGNOSIS — R809 Proteinuria, unspecified: Secondary | ICD-10-CM | POA: Diagnosis not present

## 2021-12-19 DIAGNOSIS — Z794 Long term (current) use of insulin: Secondary | ICD-10-CM | POA: Diagnosis not present

## 2021-12-24 ENCOUNTER — Encounter: Payer: Medicare Other | Admitting: Speech Pathology

## 2021-12-24 ENCOUNTER — Ambulatory Visit: Payer: Medicare Other | Attending: Otolaryngology | Admitting: Speech Pathology

## 2021-12-24 ENCOUNTER — Other Ambulatory Visit: Payer: Self-pay

## 2021-12-24 DIAGNOSIS — R490 Dysphonia: Secondary | ICD-10-CM | POA: Diagnosis not present

## 2021-12-25 NOTE — Therapy (Signed)
[place] MAIN [facility] SERVICES [address] [place], [place], [address] Phone: [phone]   Fax:  [phone]  Speech Language Pathology Treatment  Patient Details  Name: Tonya [name] MRN: [id number] Date of Birth: [birth year] Referring Provider (SLP): Carloyn Manner   Encounter Date: 12/24/2021   End of Session - 12/25/21 1558     Visit Number 13    Number of Visits 28    Date for SLP Re-Evaluation 01/16/22    Authorization Type [employer]    Authorization Time Period 11/06/2021 thru 01/16/2022    Authorization - Visit Number 3    Progress Note Due on Visit 10    SLP Start Time 1100    SLP Stop Time  1200    SLP Time Calculation (min) 60 min    Activity Tolerance Patient tolerated treatment well             Past Medical History:  Diagnosis Date   (HFpEF) heart failure with preserved ejection fraction ([place])    a. 05/2017 Echo: EF 55-60%, Gr1 DD, mildly dil LA.   CKD (chronic kidney disease), stage III ([place])    Diabetes mellitus without complication ([place])    Hyperlipidemia    Hypertension    Stroke ([place])    a. 08/2017.   Wears dentures    partial upper    Past Surgical History:  Procedure Laterality Date   CATARACT EXTRACTION W/PHACO Left 02/05/2021   Procedure: CATARACT EXTRACTION PHACO AND INTRAOCULAR LENS PLACEMENT (IOC) LEFT DIABETIC 5.47 00:51.4;  Surgeon: Birder Robson, MD;  Location: [place];  Service: Ophthalmology;  Laterality: Left;  Diabetic - insulin and oral meds   CATARACT EXTRACTION W/PHACO Right 03/19/2021   Procedure: CATARACT EXTRACTION PHACO AND INTRAOCULAR LENS PLACEMENT (IOC) RIGHT DIABETIC 6.09 00:39.6;  Surgeon: Birder Robson, MD;  Location: [place];  Service: Ophthalmology;  Laterality: Right;   COLONOSCOPY  12/15/2006   COLONOSCOPY WITH PROPOFOL N/A 06/06/2019   Procedure: COLONOSCOPY WITH PROPOFOL;  Surgeon: [name], MD;  Location: [place] [facility];   Service: Gastroenterology;  Laterality: N/A;   COLONOSCOPY WITH PROPOFOL N/A 01/16/2020   Procedure: COLONOSCOPY WITH BIOPSY;  Surgeon: [name], MD;  Location: [place];  Service: Endoscopy;  Laterality: N/A;  Diabetic - insulin and oral meds   POLYPECTOMY N/A 01/16/2020   Procedure: POLYPECTOMY;  Surgeon: [name], MD;  Location: [place];  Service: Endoscopy;  Laterality: N/A;   TUBAL LIGATION      There were no vitals filed for this visit.   Subjective Assessment - 12/25/21 1551     Subjective "My breathing is worse"    Currently in Pain? No/denies                   ADULT SLP TREATMENT - 12/25/21 0001       Treatment Provided   Treatment provided Cognitive-Linquistic      Cognitive-Linquistic Treatment   Treatment focused on Voice;Patient/family/caregiver education    Skilled Treatment Pt arrived for skilled voice therapy. Pt presents with continued dysphonia that is c/b gravelly hoarse vocal quality with reduced pitch (201 Hz). More importantly she presents with declined respiratory support when phonating. Her sustained phonation continues to decrease to < 2 seconds. Despite max multimodal cues, pt is not able to increased her vocal intensity without tension observed at pt's neck.              SLP Education - 12/25/21 1558  Education Details multifactorial nature of dysphonia    Person(s) Educated Patient    Methods Explanation;Demonstration    Comprehension Verbalized understanding;Need further instruction              SLP Short Term Goals - 12/25/21 1600       SLP SHORT TERM GOAL #1   Title The patient will decrease laryngeal and articulatory muscle tension by independently completing relaxation/stretching exercises.    Status Partially Met      SLP SHORT TERM GOAL #2   Title The patient will eliminate phonotraumatic behaviors such as chronic throat clearing, by substituting non-traumatic methods to clear mucus.     Status Partially Met      SLP SHORT TERM GOAL #3   Title The patient will increase hydration for an eventual goal of 6-8 glasses per day and limit caffeine intake (to maximum of 1-2, 8 oz cups/day), as measured by patient report.    Status Partially Met      SLP SHORT TERM GOAL #4   Title The patient will complete vocal function exercises without laryngeal tension.    Status Partially Met              SLP Long Term Goals - 12/25/21 1600       SLP LONG TERM GOAL #1   Title The patient will increase hydration for an eventual goal of 6-8 glasses per day and limit caffeine intake (to maximum of 1-2, 8 oz cups/day), as measured by patient report.    Status Partially Met      SLP LONG TERM GOAL #2   Title The patient will demonstrate independent understanding of vocal hygiene concepts by answering [facility] questions with 90% accuracy.    Status Achieved      SLP LONG TERM GOAL #3   Title The patient will eliminate phonotraumatic behaviors such as chronic throat clearing, by substituting non-traumatic methods to clear mucus.    Status Partially Met      SLP LONG TERM GOAL #4   Title The patient will maximize voice quality and loudness using breath support/oral resonance for sustained vowel production, pitch glides, and hierarchal speech drill.    Status Partially Met              Plan - 12/25/21 1559     Clinical Impression Statement Despite extensive education, on the phono-traumatic nature, pt continues to demonstrate fixation on singing in her church choir that results in hoarseness for remainder of day. Pt's dysphonia is multifactorial related to continued phono-traumatic behaviors, decreased respiratory support, inconsistent completion of vocal exercises at home, decreased understanding and recall of information requiring more than a reasonable amount of trials to demonstrate therapeutic activities. Pt is interested in following up with Pulmonology to treat her poor respiratory  support. At this time, will place pt on hold pending further improvement in the above areas. Consult sent to Pulmonology.    Speech Therapy Frequency --   placing pt on hold pending Pulmonology   Treatment/Interventions SLP instruction and feedback;Patient/family education    Potential to Achieve Goals Good    Potential Considerations Ability to learn/carryover information;Severity of impairments;Co-morbidities;Previous level of function    Consulted and Agree with Plan of Care Patient            Problem List Patient Active Problem List   Diagnosis Date Noted   Neuropathic pain 06/07/2020   Lumbar degenerative disc disease 06/07/2020   Lumbar spondylosis 06/07/2020   Chronic pain syndrome 04/18/2020  Sacroiliac joint pain 04/18/2020   Chronic heart failure with preserved ejection fraction (HFpEF) ([place]) 01/11/2020   Grade I diastolic dysfunction 89/84/2103   History of CVA (cerebrovascular accident) 12/01/2019   Hyperparathyroidism due to renal insufficiency ([place]) 08/31/2019   Anemia in CKD (chronic kidney disease) 02/22/2019   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 Rutherford Nail M.S., CCC-SLP, [facility] Pathologist Rehabilitation Services Office [phone]  Stormy Fabian, [place] 12/25/2021, 4:01 PM  [place] MAIN [facility] SERVICES [address] [place], [place], [address] Phone: [phone]   Fax:  [phone]   Name: Tonya [name] MRN: [id number] Date of Birth:  Apr 20, 1946

## 2021-12-26 ENCOUNTER — Encounter: Payer: Medicare Other | Admitting: Speech Pathology

## 2021-12-26 ENCOUNTER — Ambulatory Visit: Payer: Medicare Other | Admitting: Speech Pathology

## 2021-12-26 DIAGNOSIS — E113293 Type 2 diabetes mellitus with mild nonproliferative diabetic retinopathy without macular edema, bilateral: Secondary | ICD-10-CM | POA: Diagnosis not present

## 2021-12-31 ENCOUNTER — Encounter: Payer: Medicare Other | Admitting: Speech Pathology

## 2022-01-02 ENCOUNTER — Encounter: Payer: Medicare Other | Admitting: Speech Pathology

## 2022-01-07 ENCOUNTER — Encounter: Payer: Medicare Other | Admitting: Speech Pathology

## 2022-01-07 ENCOUNTER — Other Ambulatory Visit: Payer: Self-pay | Admitting: Cardiovascular Disease

## 2022-01-09 ENCOUNTER — Encounter: Payer: Medicare Other | Admitting: Speech Pathology

## 2022-01-14 ENCOUNTER — Encounter: Payer: Medicare Other | Admitting: Speech Pathology

## 2022-01-16 ENCOUNTER — Encounter: Payer: Medicare Other | Admitting: Speech Pathology

## 2022-01-26 NOTE — Patient Instructions (Addendum)
Your are scheduled to see kidney doctor on 02/05/22.  Diabetes Mellitus and Nutrition, Adult When you have diabetes, or diabetes mellitus, it is very important to have healthy eating habits because your blood sugar (glucose) levels are greatly affected by what you eat and drink. Eating healthy foods in the right amounts, at about the same times every day, can help you: Manage your blood glucose. Lower your risk of heart disease. Improve your blood pressure. Reach or maintain a healthy weight. What can affect my meal plan? Every person with diabetes is different, and each person has different needs for a meal plan. Your health care provider may recommend that you work with a dietitian to make a meal plan that is best for you. Your meal plan may vary depending on factors such as: The calories you need. The medicines you take. Your weight. Your blood glucose, blood pressure, and cholesterol levels. Your activity level. Other health conditions you have, such as heart or kidney disease. How do carbohydrates affect me? Carbohydrates, also called carbs, affect your blood glucose level more than any other type of food. Eating carbs raises the amount of glucose in your blood. It is important to know how many carbs you can safely have in each meal. This is different for every person. Your dietitian can help you calculate how many carbs you should have at each meal and for each snack. How does alcohol affect me? Alcohol can cause a decrease in blood glucose (hypoglycemia), especially if you use insulin or take certain diabetes medicines by mouth. Hypoglycemia can be a life-threatening condition. Symptoms of hypoglycemia, such as sleepiness, dizziness, and confusion, are similar to symptoms of having too much alcohol. Do not drink alcohol if: Your health care provider tells you not to drink. You are pregnant, may be pregnant, or are planning to become pregnant. If you drink alcohol: Limit how much you  have to: 0-1 drink a day for women. 0-2 drinks a day for men. Know how much alcohol is in your drink. In the [place] one drink equals one 12 oz bottle of beer (355 mL), one 5 oz glass of wine (148 mL), or one 1 oz glass of hard liquor (44 mL). Keep yourself hydrated with water, diet soda, or unsweetened iced tea. Keep in mind that regular soda, juice, and other mixers may contain a lot of sugar and must be counted as carbs. What are tips for following this plan? Reading food labels Start by checking the serving size on the Nutrition Facts label of packaged foods and drinks. The number of calories and the amount of carbs, fats, and other nutrients listed on the label are based on one serving of the item. Many items contain more than one serving per package. Check the total grams (g) of carbs in one serving. Check the number of grams of saturated fats and trans fats in one serving. Choose foods that have a low amount or none of these fats. Check the number of milligrams (mg) of salt (sodium) in one serving. Most people should limit total sodium intake to less than 2,300 mg per day. Always check the nutrition information of foods labeled as "low-fat" or "nonfat." These foods may be higher in added sugar or refined carbs and should be avoided. Talk to your dietitian to identify your daily goals for nutrients listed on the label. Shopping Avoid buying canned, pre-made, or processed foods. These foods tend to be high in fat, sodium, and added sugar. Shop around the outside  edge of the grocery store. This is where you will most often find fresh fruits and vegetables, bulk grains, fresh meats, and fresh dairy products. Cooking Use low-heat cooking methods, such as baking, instead of high-heat cooking methods, such as deep frying. Cook using healthy oils, such as olive, canola, or sunflower oil. Avoid cooking with butter, cream, or high-fat meats. Meal planning Eat meals and snacks regularly, preferably  at the same times every day. Avoid going long periods of time without eating. Eat foods that are high in fiber, such as fresh fruits, vegetables, beans, and whole grains. Eat 4-6 oz (112-168 g) of lean protein each day, such as lean meat, chicken, fish, eggs, or tofu. One ounce (oz) (28 g) of lean protein is equal to: 1 oz (28 g) of meat, chicken, or fish. 1 egg.  cup (62 g) of tofu. Eat some foods each day that contain healthy fats, such as avocado, nuts, seeds, and fish. What foods should I eat? Fruits Berries. Apples. Oranges. Peaches. Apricots. Plums. Grapes. Mangoes. Papayas. Pomegranates. Kiwi. Cherries. Vegetables Leafy greens, including lettuce, spinach, kale, chard, collard greens, mustard greens, and cabbage. Beets. Cauliflower. Broccoli. Carrots. Green beans. Tomatoes. Peppers. Onions. Cucumbers. Brussels sprouts. Grains Whole grains, such as whole-wheat or whole-grain bread, crackers, tortillas, cereal, and pasta. Unsweetened oatmeal. Quinoa. Brown or wild rice. Meats and other proteins Seafood. Poultry without skin. Lean cuts of poultry and beef. Tofu. Nuts. Seeds. Dairy Low-fat or fat-free dairy products such as milk, yogurt, and cheese. The items listed above may not be a complete list of foods and beverages you can eat and drink. Contact a dietitian for more information. What foods should I avoid? Fruits Fruits canned with syrup. Vegetables Canned vegetables. Frozen vegetables with butter or cream sauce. Grains Refined white flour and flour products such as bread, pasta, snack foods, and cereals. Avoid all processed foods. Meats and other proteins Fatty cuts of meat. Poultry with skin. Breaded or fried meats. Processed meat. Avoid saturated fats. Dairy Full-fat yogurt, cheese, or milk. Beverages Sweetened drinks, such as soda or iced tea. The items listed above may not be a complete list of foods and beverages you should avoid. Contact a dietitian for more  information. Questions to ask a health care provider Do I need to meet with a certified diabetes care and education specialist? Do I need to meet with a dietitian? What number can I call if I have questions? When are the best times to check my blood glucose? Where to find more information: American Diabetes Association: diabetes.org Academy of Nutrition and Dietetics: eatright.[employer] of Diabetes and Digestive and Kidney Diseases: [employer]is Association of Diabetes Care & Education Specialists: diabeteseducator.org Summary It is important to have healthy eating habits because your blood sugar (glucose) levels are greatly affected by what you eat and drink. It is important to use alcohol carefully. A healthy meal plan will help you manage your blood glucose and lower your risk of heart disease. Your health care provider may recommend that you work with a dietitian to make a meal plan that is best for you. This information is not intended to replace advice given to you by your health care provider. Make sure you discuss any questions you have with your health care provider. Document Revised: 07/04/2020 Document Reviewed: 07/04/2020 Elsevier Patient Education  [place].

## 2022-01-29 ENCOUNTER — Encounter: Payer: Self-pay | Admitting: Nurse Practitioner

## 2022-01-29 ENCOUNTER — Other Ambulatory Visit: Payer: Self-pay

## 2022-01-29 ENCOUNTER — Ambulatory Visit (INDEPENDENT_AMBULATORY_CARE_PROVIDER_SITE_OTHER): Payer: Medicare Other | Admitting: Nurse Practitioner

## 2022-01-29 VITALS — BP 102/70 | HR 92 | Temp 97.9°F | Ht 65.98 in | Wt 165.4 lb

## 2022-01-29 DIAGNOSIS — E1165 Type 2 diabetes mellitus with hyperglycemia: Secondary | ICD-10-CM

## 2022-01-29 DIAGNOSIS — Z794 Long term (current) use of insulin: Secondary | ICD-10-CM | POA: Diagnosis not present

## 2022-01-29 DIAGNOSIS — E1140 Type 2 diabetes mellitus with diabetic neuropathy, unspecified: Secondary | ICD-10-CM | POA: Diagnosis not present

## 2022-01-29 DIAGNOSIS — M67432 Ganglion, left wrist: Secondary | ICD-10-CM | POA: Diagnosis not present

## 2022-01-29 DIAGNOSIS — I5032 Chronic diastolic (congestive) heart failure: Secondary | ICD-10-CM

## 2022-01-29 DIAGNOSIS — D631 Anemia in chronic kidney disease: Secondary | ICD-10-CM

## 2022-01-29 DIAGNOSIS — I130 Hypertensive heart and chronic kidney disease with heart failure and stage 1 through stage 4 chronic kidney disease, or unspecified chronic kidney disease: Secondary | ICD-10-CM | POA: Diagnosis not present

## 2022-01-29 DIAGNOSIS — B0229 Other postherpetic nervous system involvement: Secondary | ICD-10-CM

## 2022-01-29 DIAGNOSIS — E785 Hyperlipidemia, unspecified: Secondary | ICD-10-CM

## 2022-01-29 DIAGNOSIS — E1169 Type 2 diabetes mellitus with other specified complication: Secondary | ICD-10-CM

## 2022-01-29 DIAGNOSIS — Z862 Personal history of diseases of the blood and blood-forming organs and certain disorders involving the immune mechanism: Secondary | ICD-10-CM | POA: Insufficient documentation

## 2022-01-29 DIAGNOSIS — G894 Chronic pain syndrome: Secondary | ICD-10-CM | POA: Diagnosis not present

## 2022-01-29 DIAGNOSIS — N184 Chronic kidney disease, stage 4 (severe): Secondary | ICD-10-CM | POA: Diagnosis not present

## 2022-01-29 DIAGNOSIS — N2581 Secondary hyperparathyroidism of renal origin: Secondary | ICD-10-CM | POA: Diagnosis not present

## 2022-01-29 NOTE — Assessment & Plan Note (Signed)
Chronic with poor tolerance to Gabapentin and Lyrica and minimal relief with simple treatment methods.  Currently followed by pain management, continue this collaboration.

## 2022-01-29 NOTE — Assessment & Plan Note (Signed)
Chronic, ongoing, followed by endocrinology.  A1C with endo in January 9.2%. Continue current medication regimen and defer changes to endo due to ongoing lows on occasion.  Cardiology and PCP have recommended to endo discontinuing Actos due to her HF.  Continue to collaborate with Dr. [name] and CCM team. Recommend she monitor BS at least 3 times a day at home, continue consistent Carey use.  Return in 6 months as is being followed closely by endo.

## 2022-01-29 NOTE — Assessment & Plan Note (Signed)
For several weeks with left wrist pain, will place referral to ortho for further recommendations. Recommend Tylenol at home as needed + Diclofenac gel.

## 2022-01-29 NOTE — Assessment & Plan Note (Signed)
Chronic, ongoing. BP at goal today in office and on home readings.  Continue current medication regimen and collaboration with nephrology.  Labs up to date and reviewed in Care Everywhere.  Recommend she continue to monitor BP at home daily and document for providers + focus on DASH diet.   Return in 3 months.

## 2022-01-29 NOTE — Progress Notes (Signed)
BP 102/70    Pulse 92    Temp 97.9 F (36.6 C) (Oral)    Ht 5' 5.98" (1.676 m)    Wt 165 lb 6.4 oz (75 kg)    LMP  (LMP Unknown)    BMI 26.71 kg/m    Subjective:    Patient ID: Tonya [name], female    DOB: [birth year], 76 y.o.   MRN: [id number]  HPI: Tonya [name] is a 76 y.o. female  Chief Complaint  Patient presents with   Diabetes   Hyperlipidemia   Congestive Heart Failure   Chronic Kidney Disease   DIABETES Last saw Dr. [name]  with endo on 12/19/21 with A1C 9.2%.  On review patient had been skipping her Humalog before meals if sugars were good, she was counseled by endo not to do this.  Cardiology and PCP have recommended in past to endo, discontinuation of Actos due to her HF, she reports this medication continues and remains on list.  Previously has tried Toujeo and Trulicity -- did not tolerate.  Currently taking Humalog and Actos -- she self stopped Basaglar which per endo note is okay at this time as fasting sugars stable.   Freestyle in place.  Reports sugars were good until she ran out of sensors. Polydipsia/polyuria: no Visual disturbance: no Chest pain: no Paresthesias: no Glucose Monitoring: yes             Accucheck frequency: TID              Fasting glucose: 102 to 112             Post prandial:             Evening: 200 range             Before meals:              Taking Insulin?: yes             Long acting insulin: None, endo stopped             Short acting insulin: Humalog 10 units in morning and then 8 units at lunch and dinner Blood Pressure Monitoring: a few times a week Retinal Examination: Up to Date -- 06/01/20 Foot Exam: Up to Date Pneumovax: Up to Date Influenza: Up to Date Aspirin: yes    HYPERTENSION / HYPERLIPIDEMIA/HF Sees cardiology, Dr. [name] Situ, with last visit 04/19/21 with no changes made. Continues on Carvedilol, Torsemide, and Crestor.  Her LVEF on 12/02/2019 was 60 to 65% with moderate LVH, and grade 1 diastolic dysfunction.  Is  not wearing compression hose as recommended at home -- she reports they are too tight to wear.  Was recently being followed by speech therapy for hoarseness, sent to them by ENT.  They discussed with patient possible visit with pulmonary, via secure chat ST does recommend this as patient had difficulty with abdominal breathing and she was concerned it could be related to her h/o sarcoidosis. Satisfied with current treatment? yes Duration of hypertension: chronic BP monitoring frequency: a few times a week BP range: 120-130/80 range BP medication side effects: no Duration of hyperlipidemia: chronic Cholesterol medication side effects: no Cholesterol supplements: none Medication compliance: good compliance Aspirin: yes Recent stressors: no Recurrent headaches: no Visual changes: no Palpitations: no Dyspnea: no Chest pain: no Lower extremity edema: at baseline, small amount Dizzy/lightheaded: no   WRIST PAIN  Ongoing for 3 months, has had similar issues before.  Right handed at  baseline, left wrist is bothering her. Duration: months Involved wrist: left Mechanism of injury:  unknown Location: radial Onset: gradual Severity: 5/10  Quality:  dull, aching, and throbbing Frequency: intermittent Radiation: no Aggravating factors: movement and gripping  Alleviating factors: rest  Status: fluctuating Treatments attempted: APAP and aleve    Relief with NSAIDs?:  No NSAIDs Taken Weakness: no Numbness: no  none Redness: no Bruising: no Swelling: no Fevers: no    CHRONIC KIDNEY DISEASE Followed by nephrology and last saw 10/22/21.  Is followed by hematology for anemia in CKD and last seen 12/27/2019.  Recent H/H with nephrology 8.7/28.5 == has been to hematology in past, but not recently. CKD status: stable Medications renally dose: yes Previous renal evaluation: yes Pneumovax:  Up to Date Influenza Vaccine:  Up to Date  CMP Latest Ref Rng & Units 02/12/2021 01/29/2021 10/25/2020   Glucose 70 - 99 mg/dL 204(H) 168(H) 109(H)  BUN 8 - 23 mg/dL 53(H) 57(H) 54(H)  Creatinine 0.44 - 1.00 mg/dL 2.45(H) 2.60(H) 2.56(H)  Sodium 135 - 145 mmol/L 139 145(H) 145(H)  Potassium 3.5 - 5.1 mmol/L 4.3 4.8 4.6  Chloride 98 - 111 mmol/L 109 108(H) 110(H)  CO2 22 - 32 mmol/L 21(L) 17(L) 21  Calcium 8.9 - 10.3 mg/dL 8.7(L) 8.9 9.2  Total Protein 6.5 - 8.1 g/dL 6.6 - -  Total Bilirubin 0.3 - 1.2 mg/dL 0.6 - -  Alkaline Phos 38 - 126 U/L 62 - -  AST 15 - 41 U/L 21 - -  ALT 0 - 44 U/L 12 - -    POSTHERPETIC NEURALGIA: Seen by pain management and last seen by Dr. Holley Raring on 09/02/21 -- received injection at that time.  Uses CBD oil at home which offers some benefit for short period.  She does endorse consistent neuropathy discomfort to toes and feet.  History shingles years ago and since that time has had ongoing issues with pain to lower right back.  She is intolerant to Gabapentin and had issues with Pregabalin (change in mental status and hospital admission) + has tried Lidocaine patches with no benefit.  She currently takes Aleeve or Tylenol, which helps her sleep some.    Imaging to lumbar spine in 2016, where her pain is, and it showed "diffuse multilevel degenerative changes lumbar spine". Was on Duloxetine, but stopped as not offering benefit.  Relevant past medical, surgical, family and social history reviewed and updated as indicated. Interim medical history since our last visit reviewed. Allergies and medications reviewed and updated.  Review of Systems  Constitutional:  Negative for activity change, appetite change, diaphoresis, fatigue and fever.  Respiratory:  Negative for cough, chest tightness, shortness of breath and wheezing.   Cardiovascular:  Negative for chest pain, palpitations and leg swelling.  Gastrointestinal: Negative.   Neurological: Negative.   Psychiatric/Behavioral: Negative.     Per HPI unless specifically indicated above     Objective:    BP 102/70     Pulse 92    Temp 97.9 F (36.6 C) (Oral)    Ht 5' 5.98" (1.676 m)    Wt 165 lb 6.4 oz (75 kg)    LMP  (LMP Unknown)    BMI 26.71 kg/m   Wt Readings from Last 3 Encounters:  01/29/22 165 lb 6.4 oz (75 kg)  10/29/21 170 lb (77.1 kg)  09/02/21 165 lb (74.8 kg)    Physical Exam Vitals and nursing note reviewed.  Constitutional:      General: She is awake.  She is not in acute distress.    Appearance: She is well-developed. She is not ill-appearing.  HENT:     Head: Normocephalic.     Right Ear: Hearing normal.     Left Ear: Hearing normal.  Eyes:     General: Lids are normal.        Right eye: No discharge.        Left eye: No discharge.     Conjunctiva/sclera: Conjunctivae normal.     Pupils: Pupils are equal, round, and reactive to light.  Neck:     Thyroid: No thyromegaly.     Vascular: No carotid bruit.  Cardiovascular:     Rate and Rhythm: Normal rate and regular rhythm.     Heart sounds: Normal heart sounds. No murmur heard.   No gallop.  Pulmonary:     Effort: Pulmonary effort is normal. No accessory muscle usage or respiratory distress.     Breath sounds: Normal breath sounds.  Abdominal:     General: Bowel sounds are normal.     Palpations: Abdomen is soft.  Musculoskeletal:     Right wrist: No swelling, deformity, tenderness, snuff box tenderness or crepitus. Normal range of motion.     Left wrist: Deformity (ganglion cyst noted to left wrist) present. No swelling, tenderness, snuff box tenderness or crepitus. Normal range of motion.     Right hand: Normal.     Left hand: Normal.     Cervical back: Normal range of motion and neck supple.     Right lower leg: Edema (trace) present.     Left lower leg: Edema (trace) present.     Comments: Negative Tinel and Ph[place].  Skin:    General: Skin is warm and dry.     Findings: No rash.  Neurological:     Mental Status: She is alert and oriented to person, place, and time.  Psychiatric:        Attention and Perception:  Attention normal.        Mood and Affect: Mood normal.        Behavior: Behavior normal. Behavior is cooperative.        Thought Content: Thought content normal.        Judgment: Judgment normal.   Results for orders placed or performed in visit on 10/29/21  Lipid Panel w/o Chol/HDL Ratio  Result Value Ref Range   Cholesterol, Total 172 100 - 199 mg/dL   Triglycerides 83 0 - 149 mg/dL   HDL 70 >39 mg/dL   VLDL Cholesterol Cal 15 5 - 40 mg/dL   LDL Chol Calc (NIH) 87 0 - 99 mg/dL      Assessment & Plan:   Problem List Items Addressed This Visit       Cardiovascular and Mediastinum   Chronic heart failure with preserved ejection fraction (HFpEF) (HCC)    Chronic, ongoing with recent EF 60-65%, euvolemic at this time.  Continue current heart medication regimen and collaboration with cardiology + CCM team in office.  She needs lots of education on HF, needs to have diet reiterated each visit. - Reminded to call for an overnight weight gain of >2 pounds or a weekly weight weight of >5 pounds -- work with CCM team on obtaining scale. - not adding salt to his food and has been reading food labels. 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 BP at goal today in office and on home readings.  Continue current medication regimen and collaboration with nephrology.  Labs up to date and reviewed in Care Everywhere.  Recommend she continue to monitor BP at home daily and document for providers + focus on DASH diet.   Return in 3 months.        Endocrine   Hyperlipidemia associated with type 2 diabetes mellitus (HCC)    Chronic, ongoing.  Continue Rosuvastatin. Continue to collaborate with [facility].   Lipid panel today.      Relevant Orders   Lipid Panel w/o Chol/HDL Ratio   Hyperparathyroidism due  to renal insufficiency (HCC)    Chronic, ongoing, followed by nephrology with recent labs obtained.  Continue collaboration with nephrology.   Recent note and labs reviewed.      Relevant Orders   VITAMIN D 25 Hydroxy (Vit-D Deficiency, Fractures)   Poorly controlled type 2 diabetes mellitus with neuropathy ([place]) - Primary    Chronic, ongoing, followed by endocrinology.  A1C with endo in January 9.2%. Continue current medication regimen and defer changes to endo due to ongoing lows on occasion.  Cardiology and PCP have recommended to endo discontinuing Actos due to her HF.  Continue to collaborate with Dr. [name] and CCM team. Recommend she monitor BS at least 3 times a day at home, continue consistent [place] use.  Return in 6 months as is being followed closely by endo.        Nervous and Auditory   Post herpetic neuralgia    Chronic with poor tolerance to Gabapentin and Lyrica and minimal relief with simple treatment methods.  Currently followed by pain management, continue this collaboration.          Genitourinary   Anemia in CKD (chronic kidney disease)    Chronic with CKD.  Continue iron supplement daily and collaboration with hematology and nephrology teams -- have highly recommended she schedule follow-up with Dr. [name].  Labs up to date and reviewed in Care Everywhere.       Chronic kidney disease, stage 4, severely decreased GFR (HCC)    Chronic, ongoing.  Continue current medication regimen and collaboration with nephrology. Labs up to date and reviewed in Care Everywhere.         Other   Chronic pain syndrome    Chronic, ongoing, followed by pain management, continue this collaboration.  Recent notes reviewed.  Referral for home PT placed.      Relevant Orders   Ambulatory referral to [facility] for long-term (current) use of insulin (HCC)    Chronic, ongoing.  Monitor closely due to AM low BS.  Continue collaboration with endocrinology.      Ganglion cyst of  wrist, left    For several weeks with left wrist pain, will place referral to ortho for further recommendations. Recommend Tylenol at home as needed + Diclofenac gel.      Relevant Orders   Ambulatory referral to Orthopedics   H/O sarcoidosis    Referral to pulmonary per speech therapy recommendations.      Relevant Orders   Ambulatory referral to Pulmonology     Follow up plan: Return in about 6 months (around 07/29/2022) for T2DM, HTN/HLD, LUNGS, PAIN, HF.

## 2022-01-29 NOTE — Assessment & Plan Note (Signed)
Chronic, ongoing, followed by nephrology with recent labs obtained.  Continue collaboration with nephrology.   Recent note and labs reviewed. 

## 2022-01-29 NOTE — Assessment & Plan Note (Signed)
Referral to pulmonary per speech therapy recommendations.

## 2022-01-29 NOTE — Assessment & Plan Note (Addendum)
Chronic, ongoing, followed by pain management, continue this collaboration.  Recent notes reviewed.  Referral for home PT placed.

## 2022-01-29 NOTE — Assessment & Plan Note (Signed)
Chronic, ongoing.  Continue Rosuvastatin. Continue to collaborate with [facility].   Lipid panel today. 

## 2022-01-29 NOTE — Assessment & Plan Note (Signed)
Chronic, ongoing.  Continue current medication regimen and collaboration with nephrology. Labs up to date and reviewed in Care Everywhere.  

## 2022-01-29 NOTE — Assessment & Plan Note (Signed)
Chronic, ongoing.  Monitor closely due to AM low BS.  Continue collaboration with endocrinology. 

## 2022-01-29 NOTE — Assessment & Plan Note (Signed)
Chronic, ongoing with recent EF 60-65%, euvolemic at this time.  Continue current heart medication regimen and collaboration with cardiology + CCM team in office.  She needs lots of education on HF, needs to have diet reiterated each visit. - Reminded to call for an overnight weight gain of >2 pounds or a weekly weight weight of >5 pounds -- work with CCM team on obtaining scale. - not adding salt to his food and has been reading food labels. Reviewed the importance of keeping daily sodium intake to 2000mg  daily  - Wear compression hose daily -- not on today and not consistently wearing

## 2022-01-29 NOTE — Assessment & Plan Note (Signed)
Chronic with CKD.  Continue iron supplement daily and collaboration with hematology and nephrology teams -- have highly recommended she schedule follow-up with Dr. [name].  Labs up to date and reviewed in Care Everywhere.

## 2022-01-30 LAB — LIPID PANEL W/O CHOL/HDL RATIO
Cholesterol, Total: 176 mg/dL (ref 100–199)
HDL: 85 mg/dL (ref >39)
LDL Chol Calc (NIH): 76 mg/dL (ref 0–99)
Triglycerides: 84 mg/dL (ref 0–149)
VLDL Cholesterol Cal: 15 mg/dL (ref 5–40)

## 2022-01-30 LAB — VITAMIN D 25 HYDROXY (VIT D DEFICIENCY, FRACTURES): Vit D, 25-Hydroxy: 23.9 ng/mL — ABNORMAL LOW (ref 30.0–100.0)

## 2022-01-30 NOTE — Progress Notes (Signed)
Contacted via [place] afternoon Xayla, your labs have returned and Cholesterol levels remain stable, continue Rosuvastatin as ordered.  Vitamin D level a little low, please ensure you are taking Vitamin D3 2000 units daily for bone health.  Any questions? Keep being amazing!!  Thank you for allowing me to participate in your care.  I appreciate you. Kindest regards, Amil Moseman

## 2022-02-03 DIAGNOSIS — E1165 Type 2 diabetes mellitus with hyperglycemia: Secondary | ICD-10-CM | POA: Diagnosis not present

## 2022-02-03 DIAGNOSIS — Z794 Long term (current) use of insulin: Secondary | ICD-10-CM | POA: Diagnosis not present

## 2022-02-04 ENCOUNTER — Ambulatory Visit (INDEPENDENT_AMBULATORY_CARE_PROVIDER_SITE_OTHER): Payer: Medicare Other

## 2022-02-04 ENCOUNTER — Telehealth: Payer: Medicare Other

## 2022-02-04 DIAGNOSIS — E785 Hyperlipidemia, unspecified: Secondary | ICD-10-CM

## 2022-02-04 DIAGNOSIS — I130 Hypertensive heart and chronic kidney disease with heart failure and stage 1 through stage 4 chronic kidney disease, or unspecified chronic kidney disease: Secondary | ICD-10-CM

## 2022-02-04 DIAGNOSIS — M792 Neuralgia and neuritis, unspecified: Secondary | ICD-10-CM

## 2022-02-04 DIAGNOSIS — I5032 Chronic diastolic (congestive) heart failure: Secondary | ICD-10-CM

## 2022-02-04 DIAGNOSIS — M67432 Ganglion, left wrist: Secondary | ICD-10-CM

## 2022-02-04 DIAGNOSIS — E1140 Type 2 diabetes mellitus with diabetic neuropathy, unspecified: Secondary | ICD-10-CM

## 2022-02-04 DIAGNOSIS — E1169 Type 2 diabetes mellitus with other specified complication: Secondary | ICD-10-CM

## 2022-02-04 DIAGNOSIS — G894 Chronic pain syndrome: Secondary | ICD-10-CM

## 2022-02-04 NOTE — Patient Instructions (Signed)
Visit Information  Thank you for taking time to visit with me today. Please don't hesitate to contact me if I can be of assistance to you before our next scheduled telephone appointment.  Following are the goals we discussed today:  RNCM Clinical Goal(s):  Patient will verbalize understanding of plan for management of CHF, HTN, HLD, DMII, and chronic pain  as evidenced by seeing providers on a regular basis, taking medications as ordered, following plan of care and calling offie for changes take all medications exactly as prescribed and will call provider for medication related questions as evidenced by compliance and calling the pharmacy before running out of medications     attend all scheduled medical appointments: 07-29-2022, has specialist appointments coming up as evidenced by keeping appointments and calling the office for changes that need to be made if unable to make appointment         demonstrate improved and ongoing health management independence as evidenced by working with the CCM team to optimize health and well being        demonstrate a decrease in CHF, HTN, HLD, DMII, and Chronic pain  exacerbations  as evidenced by following plan of care, working with the CCM team to effectively manage health and well being  demonstrate ongoing self health care management ability for effective management of chronic conditions  as evidenced by  working with the CCM team through collaboration with Consulting civil engineer, provider, and care team.    Interventions: 1:1 collaboration with primary care provider regarding development and update of comprehensive plan of care as evidenced by provider attestation and co-signature Inter-disciplinary care team collaboration (see longitudinal plan of care) Evaluation of current treatment plan related to  self management and patient's adherence to plan as established by provider     SDOH Barriers (Status: Goal on Track (progressing): YES.) Long Term Goal  Patient  interviewed and SDOH assessment performed        Patient interviewed and appropriate assessments performed Provided patient with information about resources available and to call the office for changes in SDOH or new needs  Discussed plans with patient for ongoing care management follow up and provided patient with direct contact information for care management team Advised patient to call the office for changes in [place], questions, or concerns       Heart Failure Interventions:  (Status: Goal on Track (progressing): YES.)  Long Term Goal     Wt Readings from Last 3 Encounters:  02/04/22 163 lb (73.9 kg)  01/29/22 165 lb 6.4 oz (75 kg)  10/29/21 170 lb (77.1 kg)    Basic overview and discussion of pathophysiology of Heart Failure reviewed Provided education on low sodium diet. 02-04-2022: Review of heart healthy/ADA diet. The patient states she is watching what she eats and denies any acute issues with dietary restrictions.  Reviewed Heart Failure Action Plan in depth and provided written copy Assessed need for readable accurate scales in home. 02-04-2022: the patient states that she has a scale. She weighed this am and her weight was 163 Provided education about placing scale on hard, flat surface Advised patient to weigh each morning after emptying bladder Discussed importance of daily weight and advised patient to weigh and record daily Reviewed role of diuretics in prevention of fluid overload and management of heart failure Discussed the importance of keeping all appointments with provider. 11-26-2021: Saw pcp recently and will see specialist soon for further evaluation of raspy voice, has been working with speech therapy.  Denies any issues with HF. Will have a pulmonary appointment coming up for further evaluation of raspy voice as the speech therapist feels it has something to do with pulmonary. Saw pcp on 01-29-2022: Next appointment with pcp on 07-29-2022 Provided patient with education  about the role of exercise in the management of heart failure Advised patient to discuss HF and heart health with provider Screening for signs and symptoms of depression related to chronic disease state  Assessed social determinant of health barriers Evaluation of edema and wearing compression stockings. The patient states she is not using these. Encouraged by the pcp to start wearing. RNCM recommended trying the [employer] compression socks for a different alternative to the compression hose. Wrote down the information for the patient and where to look into getting these socks. Encouraged the patient that this would also help with her neuropathy. 02-04-2022: The patient denies any swelling in her feet and legs. States her HF is stable. Will continue to monitor. She is still not using compression stocking. Encouraged use and ask the patient to elevate her legs when sitting down. She states her edema is stable and not more than usual.    Diabetes:  (Status: Goal on Track (progressing): YES.) Long Term Goal         Lab Results  Component Value Date    HGBA1C 8.2 (H) 01/29/2021  08-30-2021: 9.1 at Endocrinologist. January 2023 at endocrinology 9.2% Assessed patient's understanding of A1c goal: <7% Provided education to patient about basic DM disease process; Reviewed medications with patient and discussed importance of medication adherence;        Reviewed prescribed diet with patient heart healthy/ADA; Counseled on importance of regular laboratory monitoring as prescribed. 02-04-2022: The patient has regular lab testing. Will see endocrinology again in March;        Discussed plans with patient for ongoing care management follow up and provided patient with direct contact information for care management team;      Provided patient with written educational materials related to hypo and hyperglycemia and importance of correct treatment. 10-29-2021: The patient had a low of 40 and 55 on 10-21-2021: Has  had 7 lows over the last 90 days. Education on keeping gel with her in her pocket book and at home for lows. The patient does eat peanut butter and drinks something that will help bring it back up when she is at home. Education and support given.   11-26-2021: Denies any lows at this time. States her blood sugars are consistently around 112.   02-04-2022: The patient has not been checking her blood sugars this past week. She ran out of supplies. They shipped on 02-02-2022 so she should have them soon. Denies any sx and sx of hyper or hypoglycemia   Reviewed scheduled/upcoming provider appointments including: 07-29-2022 with pcp,  has upcoming specialist appointments ;         Advised patient, providing education and rationale, to check cbg as directed, the patient has the freestyle [place]  and record . 10-29-2021: Average has been 186. The patients range has been 40 on 11-7 to 273.  Extensive education from the pcp today to discuss restarting long acting insulin.  The patient verbalized understanding. 02-04-2022: Encouraged the patient to check blood sugars per recommendations of the provider when she got her new supplies. The patient verbalized understanding. States that the last time she took it it was 110 and 112.      call provider for findings outside established parameters.  11-26-2021: States that her blood sugars have been consistently 112 in the ams. Denies any elevations or lows;       Review of patient status, including review of consultants reports, relevant laboratory and other test results, and medications completed;       Advised patient to discuss medications  with provider.  The patient has not been taking her long acting insulin but has her short acting and is experiencing some lows. Education and support given      Evaluation of neuropathy pain. The patient sees Dr. Holley Raring as she cannot tolerate gabapentin or lyricia. Cymbalta did not offer any relief either. Pcp is going to discuss capzasin cream  wraps to toes with Dr. Holley Raring as this is something he is trying in the office now to help with pain relief from neuropathy.  Eye exam done in January of 2023   Hyperlipidemia:  (Status: Goal on Track (progressing): YES.) Long Term Goal       Lab Results  Component Value Date    CHOL 176 01/29/2022    HDL 85 01/29/2022    LDLCALC 76 01/29/2022    LDLDIRECT 67 12/30/2018    TRIG 84 01/29/2022    CHOLHDL 2.5 12/02/2019    Medication review performed; medication list updated in electronic medical record. 02-04-2022: Is taking Crestor 5 mg daily as directed.  Provider established cholesterol goals reviewed. 02-04-2022: The patient has WNL lab work. The patient is doing well with managing her cholesterol at this time Counseled on importance of regular laboratory monitoring as prescribed. 02-04-2022: Has labs on a consistent basis.  Provided HLD educational materials; Reviewed role and benefits of statin for ASCVD risk reduction; Discussed strategies to manage statin-induced myalgias; Reviewed importance of limiting foods high in cholesterol. 02-04-2022: Review of heart healthy/ADA diet    Hypertension: (Status: Goal on Track (progressing): YES.) Last practice recorded BP readings:     BP Readings from Last 3 Encounters:  01/29/22 102/70  10/29/21 138/76  09/02/21 (!) 151/82  Most recent eGFR/CrCl: No results found for: EGFR  No components found for: CRCL   Evaluation of current treatment plan related to hypertension self management and patient's adherence to plan as established by provider. 02-04-2022: The patient feels her blood pressure is better controlled. She denies any acute findings related to HTN health.   Provided education to patient re: stroke prevention, s/s of heart attack and stroke; Reviewed prescribed diet heart healthy/ADA diet  Reviewed medications with patient and discussed importance of compliance. 02-04-2022: The patient is compliant with medications. Denies any medication  needs;  Counseled on adverse effects of illicit drug and excessive alcohol use in patients with high blood pressure;  Discussed plans with patient for ongoing care management follow up and provided patient with direct contact information for care management team; Advised patient, providing education and rationale, to monitor blood pressure daily and record, calling PCP for findings outside established parameters;  Advised patient to discuss blood pressure trends  with provider; Provided education on prescribed diet heart healthy/ADA diet ;  Discussed complications of poorly controlled blood pressure such as heart disease, stroke, circulatory complications, vision complications, kidney impairment, sexual dysfunction;    Pain:  (Status: Goal on Track (progressing): YES.) Long Term Goal  Pain assessment performed. 11-26-2021: States "0" pain at the time of the call. When she is active and over does it it can be up to a 10. 02-04-2022: The patient rates her back pain and neuropathy pain at a 5 today. She  will go to the orthopedic provider on 02-07-2022 for evaluation of left wrist pain due to a cyst. She states this has been there for a while but over time it has gotten worse.  Medications reviewed. 02-04-2022: Is compliant with medications regimen  Reviewed provider established plan for pain management; Discussed importance of adherence to all scheduled medical appointments; Counseled on the importance of reporting any/all new or changed pain symptoms or management strategies to pain management provider; Advised patient to report to care team affect of pain on daily activities; Discussed use of relaxation techniques and/or diversional activities to assist with pain reduction (distraction, imagery, relaxation, massage, acupressure, TENS, heat, and cold application. 10-29-2021: The patient uses CBD oil and uses a spray to help with relaxing her and that has been effective in helping her rest. The pain is were  she had shingles and also OA. The patient had an injection from the pain clinic but only was effective for a short period of time. The patient states that she is also have neuropathy pain in toes. The patient cannot tolerate gabapentin or Lyrica. The pcp to collaborate with pain provider. Discussed capzasin cream wraps for toes to see if that would help with neuropathy pain. The patient is open to recommendations  Reviewed with patient prescribed pharmacological and nonpharmacological pain relief strategies; Advised patient to discuss unresolved pain, changes in intensity or level of pain  with provider;     Patient Goals/Self-Care Activities: Patient will self administer medications as prescribed as evidenced by self report/primary caregiver report  Patient will attend all scheduled provider appointments as evidenced by clinician review of documented attendance to scheduled appointments and patient/caregiver report Patient will call pharmacy for medication refills as evidenced by patient report and review of pharmacy fill history as appropriate Patient will attend church or other social activities as evidenced by patient report Patient will continue to perform ADL's independently as evidenced by patient/caregiver report Patient will continue to perform IADL's independently as evidenced by patient/caregiver report Patient will call provider office for new concerns or questions as evidenced by review of documented incoming telephone call notes and patient report Patient will work with BSW to address care coordination needs and will continue to work with the clinical team to address health care and disease management related needs as evidenced by documented adherence to scheduled care management/care coordination appointments call office if I gain more than 2 pounds in one day or 5 pounds in one week keep legs up while sitting track weight in diary use salt in moderation watch for swelling in feet,  ankles and legs every day weigh myself daily develop a rescue plan follow rescue plan if symptoms flare-up eat more whole grains, fruits and vegetables, lean meats and healthy fats know when to call the doctorfor changes in HF, questions, or concerns track symptoms and what helps feel better or worse dress right for the weather, hot or cold schedule appointment with eye doctor- eye exam in January of 2023, denies any new changes or needs related to vision check blood sugar at prescribed times: when you have symptoms of low or high blood sugar, before and after exercise, and has freestyle libre and can check blood sugars frequently  check feet daily for cuts, sores or redness enter blood sugar readings and medication or insulin into daily log take the blood sugar meter to all doctor visits trim toenails straight across drink 6 to 8 glasses of water each day eat fish at least once per week  fill half of plate with vegetables limit fast food meals to no more than 1 per week manage portion size prepare main meal at home 3 to 5 days each week read food labels for fat, fiber, carbohydrates and portion size reduce red meat to 2 to 3 times a week keep feet up while sitting wash and dry feet carefully every day wear comfortable, cotton socks wear comfortable, well-fitting shoes - check blood pressure daily - choose a place to take my blood pressure (home, clinic or office, retail store) - write blood pressure results in a log or diary - learn about high blood pressure - keep a blood pressure log - take blood pressure log to all doctor appointments - call doctor for signs and symptoms of high blood pressure - develop an action plan for high blood pressure - keep all doctor appointments - take medications for blood pressure exactly as prescribed - report new symptoms to your doctor - eat more whole grains, fruits and vegetables, lean meats and healthy fats - call for medicine refill 2 or 3  days before it runs out - take all medications exactly as prescribed - call doctor with any symptoms you believe are related to your medicine - call doctor when you experience any new symptoms - go to all doctor appointments as scheduled - adhere to prescribed diet: heart healthy/ADA diet       Our next appointment is by telephone on 04-01-2022 at 1pm  Please call the care guide team at [phone] if you need to cancel or reschedule your appointment.   If you are experiencing a Mental Health or [place] or need someone to talk to, please call the Suicide and Crisis Lifeline: 988 call the [place] National Suicide Prevention Lifeline: 770-014-9562 or TTY: (740)612-3490 TTY 9307936697) to talk to a trained counselor call 1-800-273-TALK (toll free, 24 hour hotline)   Patient verbalizes understanding of instructions and care plan provided today and agrees to view in [place]. Active MyChart status confirmed with patient.    [name] RN, MSN, [facility] Mobile: [phone]

## 2022-02-04 NOTE — Chronic Care Management (AMB) (Signed)
Chronic Care Management   CCM RN Visit Note  02/04/2022 Name: ALOHA [name] MRN: [id number] DOB: 12-06-1946  Subjective: Tonya [name] is a 75 y.o. year old female who is a primary care patient of Cannady, Tonya Faster, NP. The care management team was consulted for assistance with disease management and care coordination needs.    Engaged with patient by telephone for follow up visit in response to provider referral for case management and/or care coordination services.   Consent to Services:  The patient was given information about Chronic Care Management services, agreed to services, and gave verbal consent prior to initiation of services.  Please see initial visit note for detailed documentation.   Patient agreed to services and verbal consent obtained.   Assessment: Review of patient past medical history, allergies, medications, health status, including review of consultants reports, laboratory and other test data, was performed as part of comprehensive evaluation and provision of chronic care management services.   SDOH (Social Determinants of Health) assessments and interventions performed:    CCM Care Plan  Allergies  Allergen Reactions   Atorvastatin     Myalgias    Gabapentin Other (See Comments)    Speech impairment    Pravastatin     Myalgias    Pregabalin     Speech impairment   Trulicity [Dulaglutide] Hives    Outpatient Encounter Medications as of 02/04/2022  Medication Sig   aspirin 81 MG tablet Take 81 mg by mouth daily.   carvedilol (COREG) 3.125 MG tablet TAKE 1 TABLET BY MOUTH  TWICE DAILY WITH A MEAL   clopidogrel (PLAVIX) 75 MG tablet TAKE 1 TABLET BY MOUTH  DAILY   docusate sodium (COLACE) 100 MG capsule Take 100 mg by mouth daily as needed for mild constipation.   ferrous sulfate 325 (65 FE) MG tablet Take 325 mg by mouth daily with breakfast.   HUMALOG KWIKPEN 100 UNIT/ML KiwkPen 6 Units. Take 6 units before breakfast, lunch, supper if glucose  greater than 70   NOVOFINE 32G X 6 MM MISC USE AS DIRECTED THREE TIMES DAILY WITH HUMALOG   olmesartan (BENICAR) 40 MG tablet Take 1 tablet (40 mg total) by mouth daily.   omeprazole (PRILOSEC) 20 MG capsule TAKE 1 CAPSULE BY MOUTH  TWICE DAILY   pioglitazone (ACTOS) 30 MG tablet Take 30 mg by mouth daily.   rosuvastatin (CRESTOR) 5 MG tablet TAKE 1 TABLET BY MOUTH  DAILY   Torsemide 40 MG TABS Take by mouth.   vitamin B-12 (CYANOCOBALAMIN) 100 MCG tablet Take 100 mcg by mouth daily.   Vitamin D, Cholecalciferol, 50 MCG (2000 UT) CAPS Take 2,000 Units by mouth daily.   No facility-administered encounter medications on file as of 02/04/2022.    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[place]) 09/22/2017   Advanced care planning/counseling discussion 04/13/2017   Post herpetic neuralgia 10/10/2016   Hip bursitis 09/18/2015   Poorly controlled type 2  diabetes mellitus with neuropathy ([place]) 05/01/2015   Hyperlipidemia associated with type 2 diabetes mellitus ([place]) 05/01/2015   Chronic kidney disease, stage 4, severely decreased GFR ([place]) 05/01/2015   Encounter for long-term (current) use of insulin ([place]) 05/01/2015    Conditions to be addressed/monitored:CHF, HTN, HLD, DMII, and chronic pain   Care Plan : RNCM; General Plan of Care (Adult) for Chronic Disease Management and Care Coordination Needs  Updates made  by Vanita Ingles, RN since 02/04/2022 12:00 AM     Problem: RNCM: Development for Plan of Care for Chronic Disease Management (DM, HTN, HLD, HF, Chronic Pain)   Priority: High     Long-Range Goal: RNCM: Effective Management for Plan of Care for Chronic Disease Management (DM, HTN, HLD, HF, Chronic Pain)   Start Date: 10/29/2021  Expected End Date: 10/29/2022  Priority: High  Note:   Current Barriers:  Knowledge Deficits related to plan of care for management of CHF, HTN, HLD, DMII, and Chronic Pain   Chronic Disease Management support and education needs related to CHF, HTN, HLD, DMII, and chronic pain   RNCM Clinical Goal(s):  Patient will verbalize understanding of plan for management of CHF, HTN, HLD, DMII, and chronic pain  as evidenced by seeing providers on a regular basis, taking medications as ordered, following plan of care and calling offie for changes take all medications exactly as prescribed and will call provider for medication related questions as evidenced by compliance and calling the pharmacy before running out of medications     attend all scheduled medical appointments: 07-29-2022, has specialist appointments coming up as evidenced by keeping appointments and calling the office for changes that need to be made if unable to make appointment         demonstrate improved and ongoing health management independence as evidenced by working with the CCM team to optimize health and well being        demonstrate a decrease in CHF, HTN, HLD, DMII, and Chronic pain  exacerbations  as evidenced by following plan of care, working with the CCM team to effectively manage health and well being  demonstrate ongoing self health care management ability for effective management of chronic conditions  as evidenced by  working with the CCM team through collaboration with Consulting civil engineer, provider, and care team.   Interventions: 1:1 collaboration with primary care provider regarding development  and update of comprehensive plan of care as evidenced by provider attestation and co-signature Inter-disciplinary care team collaboration (see longitudinal plan of care) Evaluation of current treatment plan related to  self management and patient's adherence to plan as established by provider   SDOH Barriers (Status: Goal on Track (progressing): YES.) Long Term Goal  Patient interviewed and SDOH assessment performed        Patient interviewed and appropriate assessments performed Provided patient with information about resources available and to call the office for changes in SDOH or new needs  Discussed plans with patient for ongoing care management follow up and provided patient with direct contact information for care management team Advised patient to call the office for changes in [place], questions, or concerns    Heart Failure Interventions:  (Status: Goal on Track (progressing): YES.)  Long Term Goal  Wt Readings from Last 3 Encounters:  02/04/22 163 lb (73.9 kg)  01/29/22 165 lb 6.4 oz (75 kg)  10/29/21 170 lb (77.1 kg)    Basic overview and discussion of pathophysiology of Heart Failure reviewed Provided education on  low sodium diet. 02-04-2022: Review of heart healthy/ADA diet. The patient states she is watching what she eats and denies any acute issues with dietary restrictions.  Reviewed Heart Failure Action Plan in depth and provided written copy Assessed need for readable accurate scales in home. 02-04-2022: the patient states that she has a scale. She weighed this am and her weight was 163 Provided education about placing scale on hard, flat surface Advised patient to weigh each morning after emptying bladder Discussed importance of daily weight and advised patient to weigh and record daily Reviewed role of diuretics in prevention of fluid overload and management of heart failure Discussed the importance of keeping all appointments with provider. 11-26-2021: Saw pcp recently  and will see specialist soon for further evaluation of raspy voice, has been working with speech therapy. Denies any issues with HF. Will have a pulmonary appointment coming up for further evaluation of raspy voice as the speech therapist feels it has something to do with pulmonary. Saw pcp on 01-29-2022: Next appointment with pcp on 07-29-2022 Provided patient with education about the role of exercise in the management of heart failure Advised patient to discuss HF and heart health with provider Screening for signs and symptoms of depression related to chronic disease state  Assessed social determinant of health barriers Evaluation of edema and wearing compression stockings. The patient states she is not using these. Encouraged by the pcp to start wearing. RNCM recommended trying the [employer] compression socks for a different alternative to the compression hose. Wrote down the information for the patient and where to look into getting these socks. Encouraged the patient that this would also help with her neuropathy. 02-04-2022: The patient denies any swelling in her feet and legs. States her HF is stable. Will continue to monitor. She is still not using compression stocking. Encouraged use and ask the patient to elevate her legs when sitting down. She states her edema is stable and not more than usual.   Diabetes:  (Status: Goal on Track (progressing): YES.) Long Term Goal   Lab Results  Component Value Date   HGBA1C 8.2 (H) 01/29/2021  08-30-2021: 9.1 at Endocrinologist. January 2023 at endocrinology 9.2% Assessed patient's understanding of A1c goal: <7% Provided education to patient about basic DM disease process; Reviewed medications with patient and discussed importance of medication adherence;        Reviewed prescribed diet with patient heart healthy/ADA; Counseled on importance of regular laboratory monitoring as prescribed. 02-04-2022: The patient has regular lab testing. Will see  endocrinology again in March;        Discussed plans with patient for ongoing care management follow up and provided patient with direct contact information for care management team;      Provided patient with written educational materials related to hypo and hyperglycemia and importance of correct treatment. 10-29-2021: The patient had a low of 40 and 55 on 10-21-2021: Has had 7 lows over the last 90 days. Education on keeping gel with her in her pocket book and at home for lows. The patient does eat peanut butter and drinks something that will help bring it back up when she is at home. Education and support given.   11-26-2021: Denies any lows at this time. States her blood sugars are consistently around 112.   02-04-2022: The patient has not been checking her blood sugars this past week. She ran out of supplies. They shipped on 02-02-2022 so she should have them soon. Denies any sx and  sx of hyper or hypoglycemia   Reviewed scheduled/upcoming provider appointments including: 07-29-2022 with pcp,  has upcoming specialist appointments ;         Advised patient, providing education and rationale, to check cbg as directed, the patient has the freestyle [place]  and record . 10-29-2021: Average has been 186. The patients range has been 40 on 11-7 to 273.  Extensive education from the pcp today to discuss restarting long acting insulin.  The patient verbalized understanding. 02-04-2022: Encouraged the patient to check blood sugars per recommendations of the provider when she got her new supplies. The patient verbalized understanding. States that the last time she took it it was 110 and 112.      call provider for findings outside established parameters. 11-26-2021: States that her blood sugars have been consistently 112 in the ams. Denies any elevations or lows;       Review of patient status, including review of consultants reports, relevant laboratory and other test results, and medications completed;       Advised  patient to discuss medications  with provider.  The patient has not been taking her long acting insulin but has her short acting and is experiencing some lows. Education and support given      Evaluation of neuropathy pain. The patient sees Dr. Holley Raring as she cannot tolerate gabapentin or lyricia. Cymbalta did not offer any relief either. Pcp is going to discuss capzasin cream wraps to toes with Dr. Holley Raring as this is something he is trying in the office now to help with pain relief from neuropathy.  Eye exam done in January of 2023  Hyperlipidemia:  (Status: Goal on Track (progressing): YES.) Long Term Goal  Lab Results  Component Value Date   CHOL 176 01/29/2022   HDL 85 01/29/2022   LDLCALC 76 01/29/2022   LDLDIRECT 67 12/30/2018   TRIG 84 01/29/2022   CHOLHDL 2.5 12/02/2019   Medication review performed; medication list updated in electronic medical record. 02-04-2022: Is taking Crestor 5 mg daily as directed.  Provider established cholesterol goals reviewed. 02-04-2022: The patient has WNL lab work. The patient is doing well with managing her cholesterol at this time Counseled on importance of regular laboratory monitoring as prescribed. 02-04-2022: Has labs on a consistent basis.  Provided HLD educational materials; Reviewed role and benefits of statin for ASCVD risk reduction; Discussed strategies to manage statin-induced myalgias; Reviewed importance of limiting foods high in cholesterol. 02-04-2022: Review of heart healthy/ADA diet   Hypertension: (Status: Goal on Track (progressing): YES.) Last practice recorded BP readings:  BP Readings from Last 3 Encounters:  01/29/22 102/70  10/29/21 138/76  09/02/21 (!) 151/82  Most recent eGFR/CrCl: No results found for: EGFR  No components found for: CRCL  Evaluation of current treatment plan related to hypertension self management and patient's adherence to plan as established by provider. 02-04-2022: The patient feels her blood pressure is  better controlled. She denies any acute findings related to HTN health.   Provided education to patient re: stroke prevention, s/s of heart attack and stroke; Reviewed prescribed diet heart healthy/ADA diet  Reviewed medications with patient and discussed importance of compliance. 02-04-2022: The patient is compliant with medications. Denies any medication needs;  Counseled on adverse effects of illicit drug and excessive alcohol use in patients with high blood pressure;  Discussed plans with patient for ongoing care management follow up and provided patient with direct contact information for care management team; Advised patient, providing education and rationale,  to monitor blood pressure daily and record, calling PCP for findings outside established parameters;  Advised patient to discuss blood pressure trends  with provider; Provided education on prescribed diet heart healthy/ADA diet ;  Discussed complications of poorly controlled blood pressure such as heart disease, stroke, circulatory complications, vision complications, kidney impairment, sexual dysfunction;   Pain:  (Status: Goal on Track (progressing): YES.) Long Term Goal  Pain assessment performed. 11-26-2021: States "0" pain at the time of the call. When she is active and over does it it can be up to a 10. 02-04-2022: The patient rates her back pain and neuropathy pain at a 5 today. She will go to the orthopedic provider on 02-07-2022 for evaluation of left wrist pain due to a cyst. She states this has been there for a while but over time it has gotten worse.  Medications reviewed. 02-04-2022: Is compliant with medications regimen  Reviewed provider established plan for pain management; Discussed importance of adherence to all scheduled medical appointments; Counseled on the importance of reporting any/all new or changed pain symptoms or management strategies to pain management provider; Advised patient to report to care team affect of  pain on daily activities; Discussed use of relaxation techniques and/or diversional activities to assist with pain reduction (distraction, imagery, relaxation, massage, acupressure, TENS, heat, and cold application. 10-29-2021: The patient uses CBD oil and uses a spray to help with relaxing her and that has been effective in helping her rest. The pain is were she had shingles and also OA. The patient had an injection from the pain clinic but only was effective for a short period of time. The patient states that she is also have neuropathy pain in toes. The patient cannot tolerate gabapentin or Lyrica. The pcp to collaborate with pain provider. Discussed capzasin cream wraps for toes to see if that would help with neuropathy pain. The patient is open to recommendations  Reviewed with patient prescribed pharmacological and nonpharmacological pain relief strategies; Advised patient to discuss unresolved pain, changes in intensity or level of pain  with provider;   Patient Goals/Self-Care Activities: Patient will self administer medications as prescribed as evidenced by self report/primary caregiver report  Patient will attend all scheduled provider appointments as evidenced by clinician review of documented attendance to scheduled appointments and patient/caregiver report Patient will call pharmacy for medication refills as evidenced by patient report and review of pharmacy fill history as appropriate Patient will attend church or other social activities as evidenced by patient report Patient will continue to perform ADL's independently as evidenced by patient/caregiver report Patient will continue to perform IADL's independently as evidenced by patient/caregiver report Patient will call provider office for new concerns or questions as evidenced by review of documented incoming telephone call notes and patient report Patient will work with BSW to address care coordination needs and will continue to work  with the clinical team to address health care and disease management related needs as evidenced by documented adherence to scheduled care management/care coordination appointments call office if I gain more than 2 pounds in one day or 5 pounds in one week keep legs up while sitting track weight in diary use salt in moderation watch for swelling in feet, ankles and legs every day weigh myself daily develop a rescue plan follow rescue plan if symptoms flare-up eat more whole grains, fruits and vegetables, lean meats and healthy fats know when to call the doctorfor changes in HF, questions, or concerns track symptoms and what helps feel  better or worse dress right for the weather, hot or cold schedule appointment with eye doctor- eye exam in January of 2023, denies any new changes or needs related to vision check blood sugar at prescribed times: when you have symptoms of low or high blood sugar, before and after exercise, and has freestyle libre and can check blood sugars frequently  check feet daily for cuts, sores or redness enter blood sugar readings and medication or insulin into daily log take the blood sugar meter to all doctor visits trim toenails straight across drink 6 to 8 glasses of water each day eat fish at least once per week fill half of plate with vegetables limit fast food meals to no more than 1 per week manage portion size prepare main meal at home 3 to 5 days each week read food labels for fat, fiber, carbohydrates and portion size reduce red meat to 2 to 3 times a week keep feet up while sitting wash and dry feet carefully every day wear comfortable, cotton socks wear comfortable, well-fitting shoes - check blood pressure daily - choose a place to take my blood pressure (home, clinic or office, retail store) - write blood pressure results in a log or diary - learn about high blood pressure - keep a blood pressure log - take blood pressure log to all doctor  appointments - call doctor for signs and symptoms of high blood pressure - develop an action plan for high blood pressure - keep all doctor appointments - take medications for blood pressure exactly as prescribed - report new symptoms to your doctor - eat more whole grains, fruits and vegetables, lean meats and healthy fats - call for medicine refill 2 or 3 days before it runs out - take all medications exactly as prescribed - call doctor with any symptoms you believe are related to your medicine - call doctor when you experience any new symptoms - go to all doctor appointments as scheduled - adhere to prescribed diet: heart healthy/ADA diet        Plan:Telephone follow up appointment with care management team member scheduled for:  04-01-2022 at 1 pm  [place], MSN, [facility] Mobile: [phone]

## 2022-02-05 DIAGNOSIS — Z794 Long term (current) use of insulin: Secondary | ICD-10-CM | POA: Diagnosis not present

## 2022-02-05 DIAGNOSIS — R809 Proteinuria, unspecified: Secondary | ICD-10-CM | POA: Diagnosis not present

## 2022-02-05 DIAGNOSIS — E875 Hyperkalemia: Secondary | ICD-10-CM | POA: Diagnosis not present

## 2022-02-05 DIAGNOSIS — D631 Anemia in chronic kidney disease: Secondary | ICD-10-CM | POA: Diagnosis not present

## 2022-02-05 DIAGNOSIS — I129 Hypertensive chronic kidney disease with stage 1 through stage 4 chronic kidney disease, or unspecified chronic kidney disease: Secondary | ICD-10-CM | POA: Diagnosis not present

## 2022-02-05 DIAGNOSIS — N184 Chronic kidney disease, stage 4 (severe): Secondary | ICD-10-CM | POA: Diagnosis not present

## 2022-02-05 DIAGNOSIS — E1122 Type 2 diabetes mellitus with diabetic chronic kidney disease: Secondary | ICD-10-CM | POA: Diagnosis not present

## 2022-02-05 DIAGNOSIS — N2581 Secondary hyperparathyroidism of renal origin: Secondary | ICD-10-CM | POA: Diagnosis not present

## 2022-02-05 DIAGNOSIS — E119 Type 2 diabetes mellitus without complications: Secondary | ICD-10-CM | POA: Diagnosis not present

## 2022-02-07 DIAGNOSIS — M25532 Pain in left wrist: Secondary | ICD-10-CM | POA: Diagnosis not present

## 2022-02-11 DIAGNOSIS — E1140 Type 2 diabetes mellitus with diabetic neuropathy, unspecified: Secondary | ICD-10-CM | POA: Diagnosis not present

## 2022-02-11 DIAGNOSIS — I130 Hypertensive heart and chronic kidney disease with heart failure and stage 1 through stage 4 chronic kidney disease, or unspecified chronic kidney disease: Secondary | ICD-10-CM | POA: Diagnosis not present

## 2022-02-11 DIAGNOSIS — E1169 Type 2 diabetes mellitus with other specified complication: Secondary | ICD-10-CM | POA: Diagnosis not present

## 2022-02-11 DIAGNOSIS — E1165 Type 2 diabetes mellitus with hyperglycemia: Secondary | ICD-10-CM | POA: Diagnosis not present

## 2022-02-11 DIAGNOSIS — E785 Hyperlipidemia, unspecified: Secondary | ICD-10-CM

## 2022-02-11 DIAGNOSIS — I5032 Chronic diastolic (congestive) heart failure: Secondary | ICD-10-CM

## 2022-02-20 DIAGNOSIS — E113312 Type 2 diabetes mellitus with moderate nonproliferative diabetic retinopathy with macular edema, left eye: Secondary | ICD-10-CM | POA: Diagnosis not present

## 2022-02-28 DIAGNOSIS — M545 Low back pain, unspecified: Secondary | ICD-10-CM | POA: Diagnosis not present

## 2022-03-04 ENCOUNTER — Telehealth: Payer: Self-pay

## 2022-03-04 NOTE — Progress Notes (Signed)
? ? ?  Chronic Care Management ?Pharmacy Assistant  ? ?Name: ANANYA MCCLEESE  MRN: [id number] DOB: 04/28/1946 ? ?KIWANA DEBLASI is an 76 y.o. year old female who presents for his follow-up CCM visit with the clinical pharmacist. ? ?Reason for Encounter: Disease State-General ?  ? ?Recent office visits:  ?None ID ? ?Recent consult visits:  ?None ID ? ?Hospital visits:  ?None in previous 6 months ? ?Medications: ?Outpatient Encounter Medications as of 03/04/2022  ?Medication Sig  ? aspirin 81 MG tablet Take 81 mg by mouth daily.  ? carvedilol (COREG) 3.125 MG tablet TAKE 1 TABLET BY MOUTH  TWICE DAILY WITH A MEAL  ? clopidogrel (PLAVIX) 75 MG tablet TAKE 1 TABLET BY MOUTH  DAILY  ? docusate sodium (COLACE) 100 MG capsule Take 100 mg by mouth daily as needed for mild constipation.  ? ferrous sulfate 325 (65 FE) MG tablet Take 325 mg by mouth daily with breakfast.  ? HUMALOG KWIKPEN 100 UNIT/ML KiwkPen 6 Units. Take 6 units before breakfast, lunch, supper if glucose greater than 70  ? NOVOFINE 32G X 6 MM MISC USE AS DIRECTED THREE TIMES DAILY WITH HUMALOG  ? olmesartan (BENICAR) 40 MG tablet Take 1 tablet (40 mg total) by mouth daily.  ? omeprazole (PRILOSEC) 20 MG capsule TAKE 1 CAPSULE BY MOUTH  TWICE DAILY  ? pioglitazone (ACTOS) 30 MG tablet Take 30 mg by mouth daily.  ? rosuvastatin (CRESTOR) 5 MG tablet TAKE 1 TABLET BY MOUTH  DAILY  ? Torsemide 40 MG TABS Take by mouth.  ? vitamin B-12 (CYANOCOBALAMIN) 100 MCG tablet Take 100 mcg by mouth daily.  ? Vitamin D, Cholecalciferol, 50 MCG (2000 UT) CAPS Take 2,000 Units by mouth daily.  ? ?No facility-administered encounter medications on file as of 03/04/2022.  ? ?Have you had any problems recently with your health?Patient stated that she has not had any new health issues or any changes made to her medications  ? ?Have you had any problems with your pharmacy?Patient states that she does not have any problems with getting medications or the cost of medications from the  pharmacy ? ?What issues or side effects are you having with your medications?Patient states that she does not have any side effects from medications ? ?What would you like me to pass along to Edison Nasuti Potts,CPP for them to help you with? Patient states that she is doing well and does not have any complaints about her health or medications ? ?What can we do to take care of you better? Patient states that she does not need anything at this time ? ?Care Gaps: ?Colonoscopy-01/16/20 ?Diabetic Foot Exam-8/158/22 ?Mammogram-01/07/21 ?Ophthalmology-08/07/21 ?Dexa Scan - 03/11/17 ?Annual Well Visit - NA ?Micro albumin-11/22/21 ?Hemoglobin A1c- 01/01/22 ? ?Star Rating Drugs: ?Rosuvastatin 5 mg-last fill 11/13/21 90 ds ?Olmesartan 40 mg-last fill 12/05/21 90 ds ?Pioglitazone 30 mg-last fill 01/08/22 90 ds ? ?Ethelene Hal ?Clinical Pharmacist Assistant ?[phone]  ?

## 2022-03-06 DIAGNOSIS — E1165 Type 2 diabetes mellitus with hyperglycemia: Secondary | ICD-10-CM | POA: Diagnosis not present

## 2022-03-06 DIAGNOSIS — Z794 Long term (current) use of insulin: Secondary | ICD-10-CM | POA: Diagnosis not present

## 2022-03-18 ENCOUNTER — Inpatient Hospital Stay: Payer: Medicare Other | Admitting: Oncology

## 2022-03-28 ENCOUNTER — Ambulatory Visit
Admission: RE | Admit: 2022-03-28 | Discharge: 2022-03-28 | Disposition: A | Discharge status: Home / Self Care | Payer: Medicare Other | Attending: Pulmonary Disease | Admitting: Pulmonary Disease

## 2022-03-28 ENCOUNTER — Encounter: Payer: Self-pay | Admitting: Pulmonary Disease

## 2022-03-28 ENCOUNTER — Ambulatory Visit
Admission: RE | Admit: 2022-03-28 | Discharge: 2022-03-28 | Disposition: A | Discharge status: Home / Self Care | Payer: Medicare Other | Source: Ambulatory Visit | Attending: Pulmonary Disease | Admitting: Pulmonary Disease

## 2022-03-28 ENCOUNTER — Ambulatory Visit: Payer: Medicare Other | Admitting: Pulmonary Disease

## 2022-03-28 VITALS — BP 140/80 | HR 98 | Temp 98.1°F | Ht 67.0 in | Wt 172.0 lb

## 2022-03-28 DIAGNOSIS — D869 Sarcoidosis, unspecified: Secondary | ICD-10-CM | POA: Insufficient documentation

## 2022-03-28 DIAGNOSIS — R490 Dysphonia: Secondary | ICD-10-CM | POA: Diagnosis not present

## 2022-03-28 DIAGNOSIS — R1310 Dysphagia, unspecified: Secondary | ICD-10-CM | POA: Diagnosis not present

## 2022-03-28 NOTE — Progress Notes (Signed)
Subjective:    Patient ID: Tonya [name], female    DOB: 05/29/1946, 76 y.o.   MRN: [id number] Patient Care Team: Marjie Skiff, NP as PCP - General (Nurse Practitioner) Antonieta Iba, MD as PCP - Cardiology (Cardiology) Tedd Sias Marlana Salvage, MD as Physician Assistant (Endocrinology) Lamont Dowdy, MD as Consulting Physician (Nephrology) Antonieta Iba, MD as Consulting Physician (Cardiology) Marlowe Sax, RN as Case Manager (General Practice) Lajean Manes, [facility] (Inactive) (Pharmacist) Pa, [facility])  Chief Complaint  Patient presents with   Consult    Dr. Happy sent her here to have her lungs checked out.  She had sarcoidosis years ago, but it has not been a problem recently.  She had difficulty swallowing and was sent her to check out her breathing.  She says she is tired, but no sob.  Coughs sometimes when she eats.    HPI Tonya [name] is a 76 year old lifelong never smoker with a prior history of sarcoidosis who presents for evaluation of possible active sarcoidosis.  The patient is being referred by Speech Pathology, Reuel Derby, [facility] due to the patient's prior history of sarcoidosis. The patient's primary care practitioner is Aura Dials, NP.  The patient has been having issues with dysphonia and has been seeing speech pathology for the same.  The question was whether this was related to her prior sarcoidosis.  States that she was diagnosed with sarcoidosis in her 38s at [facility].  She was apparently hospitalized for a week and had a biopsy of her skin which confirmed the diagnosis.  She states that she was on prednisone for "a while" and then weaned off of these.  She did not have any symptomatology after her prednisone therapy.  She was weaned off without issue.  She does not endorse any fevers, chills or sweats.  No shortness of breath.  No hemoptysis nor hematemesis.  She has some issues with dysphonia and with dysphagia.  He has not had any  weight loss or anorexia.  She does feel occasional muscle and joint stiffness but this is self-limiting during the day and responds to acetaminophen.  She has very mild shortness of breath on activity.  She does note gastroesophageal reflux symptoms as well as heartburn.  This has been a chronic issue for her.  Review of Systems A 10 point review of systems was performed and it is as noted above otherwise negative.   Past Medical History:  Diagnosis Date   (HFpEF) heart failure with preserved ejection fraction (HCC)    a. 05/2017 Echo: EF 55-60%, Gr1 DD, mildly dil LA.   CKD (chronic kidney disease), stage III (HCC)    Diabetes mellitus without complication (HCC)    Hyperlipidemia    Hypertension    Stroke (HCC)    a. 08/2017.   Wears dentures    partial upper   Past Surgical History:  Procedure Laterality Date   CATARACT EXTRACTION W/PHACO Left 02/05/2021   Procedure: CATARACT EXTRACTION PHACO AND INTRAOCULAR LENS PLACEMENT (IOC) LEFT DIABETIC 5.47 00:51.4;  Surgeon: Galen Manila, MD;  Location: [facility];  Service: Ophthalmology;  Laterality: Left;  Diabetic - insulin and oral meds   CATARACT EXTRACTION W/PHACO Right 03/19/2021   Procedure: CATARACT EXTRACTION PHACO AND INTRAOCULAR LENS PLACEMENT (IOC) RIGHT DIABETIC 6.09 00:39.6;  Surgeon: Galen Manila, MD;  Location[facility];  Service: Ophthalmology;  Laterality: Right;   COLONOSCOPY  12/15/2006   COLONOSCOPY WITH PROPOFOL N/A 06/06/2019   Procedure:  COLONOSCOPY WITH PROPOFOL;  Surgeon: Wyline Mood, MD;  Location: [facility];  Service: Gastroenterology;  Laterality: N/A;   COLONOSCOPY WITH PROPOFOL N/A 01/16/2020   Procedure: COLONOSCOPY WITH BIOPSY;  Surgeon: Wyline Mood, MD;  Location: [facility];  Service: Endoscopy;  Laterality: N/A;  Diabetic - insulin and oral meds   POLYPECTOMY N/A 01/16/2020   Procedure: POLYPECTOMY;  Surgeon: Wyline Mood, MD;  Location: [facility];  Service:  Endoscopy;  Laterality: N/A;   TUBAL LIGATION     Patient Active Problem List   Diagnosis Date Noted   H/O sarcoidosis 01/29/2022   Ganglion cyst of wrist, left 01/29/2022   Lumbar degenerative disc disease 06/07/2020   Lumbar spondylosis 06/07/2020   Chronic pain syndrome 04/18/2020   Sacroiliac joint pain 04/18/2020   Chronic heart failure with preserved ejection fraction (HFpEF) (HCC) 01/11/2020   Grade I diastolic dysfunction 12/15/2019   History of CVA (cerebrovascular accident) 12/01/2019   Hyperparathyroidism due to renal insufficiency (HCC) 08/31/2019   Anemia in CKD (chronic kidney disease) 02/22/2019   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   Advanced care planning/counseling discussion 04/13/2017   Post herpetic neuralgia 10/10/2016   Hip bursitis 09/18/2015   Poorly controlled type 2 diabetes mellitus with neuropathy (HCC) 05/01/2015   Hyperlipidemia associated with type 2 diabetes mellitus (HCC) 05/01/2015   Chronic kidney disease, stage 4, severely decreased GFR (HCC) 05/01/2015   Encounter for long-term (current) use of insulin (HCC) 05/01/2015   Family History  Problem Relation Age of Onset   Diabetes Mother    Stroke Mother    Hyperlipidemia Father    Hypertension Father    Diabetes Sister    Diabetes Brother    Gout Son    Diabetes Brother    Kidney disease Son    Lymphoma Sister    Heart attack Sister    Breast cancer Neg Hx    Social History   Tobacco Use   Smoking status: Never    Passive exposure: Never   Smokeless tobacco: Never  Substance Use Topics   Alcohol use: No    Alcohol/week: 0.0 standard drinks of alcohol       Objective:   Physical Exam BP 140/80 (BP Location: Right Arm, Patient Position: Sitting, Cuff Size: Large)   Pulse 98   Temp 98.1 F (36.7 C) (Oral)   Ht  (1.702 m)   Wt 172 lb (78 kg)   LMP  (LMP Unknown)   SpO2 99%   BMI 26.94 kg/m   SpO2: 99 % O2 Device: None (Room  air)  GENERAL: Well-developed, well-nourished woman, no acute distress.  Fully ambulatory.  No conversational dyspnea. HEAD: Normocephalic, atraumatic.  EYES: Pupils equal, round, reactive to light.  No scleral icterus.  MOUTH: Oral mucosa moist.  No thrush.  Dentures. NECK: Supple. No thyromegaly. Trachea midline. No JVD.  No adenopathy. PULMONARY: Good air entry bilaterally.  No adventitious sounds.  CARDIOVASCULAR: S1 and S2. Regular rate and rhythm.  No rubs, murmurs or gallops heard. ABDOMEN: Benign. MUSCULOSKELETAL: No joint deformity, no clubbing, no edema.  NEUROLOGIC: No overt focal deficit, no gait disturbance, speech is fluent. SKIN: Intact,warm,dry. PSYCH: Mood and behavior normal.     Assessment & Plan:     ICD-10-CM   1. Sarcoidosis  D86.9 DG Chest 2 View    Pulmonary Function Test [facility] Only   Query activity Check chest x-ray PA and lateral Check PFTs    2. 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 Dysphagia, unspecified type  R13.10    Being followed by speech pathology The need GI evaluation     Orders Placed This Encounter  Procedures   DG Chest 2 View    Standing Status:   Future    Standing Expiration Date:   03/29/2023    Order Specific Question:   Reason for Exam (SYMPTOM  OR DIAGNOSIS REQUIRED)    Answer:   sarcoidosis    Order Specific Question:   Preferred imaging location?    Answer:   [facility]    Order Specific Question:   Radiology Contrast Protocol - do NOT remove file path    Answer:   [email or web address]   Pulmonary Function Test [facility] Only    Standing Status:   Future    Standing Expiration Date:   03/29/2023    Order Specific Question:   Full PFT: includes the following: basic spirometry, spirometry pre & post bronchodilator, diffusion capacity (DLCO), lung volumes    Answer:   Full PFT   We will see the patient in follow-up in 3 months time she  is to contact us prior to that time should any new difficulties arise.  Gailen Shelter, MD Advanced Bronchoscopy PCCM [facility]    *This note was dictated using voice recognition software/Dragon.  Despite best efforts to proofread, errors can occur which can change the meaning. Any transcriptional errors that result from this process are unintentional and may not be fully corrected at the time of dictation.

## 2022-03-28 NOTE — Patient Instructions (Addendum)
We are going to get a breathing test and chest x-ray.  This will tell us about the status of your lungs.  However, your lungs sounded very clear today but was not 99% which is perfect. ? ?I recommend you continue doing some exercises with your voice even being humming or singing to yourself at home that helps strengthen the muscles that activate the vocal cords. ? ?We will see you in follow-up in 3 months time please call sooner should any new problems arise. ?

## 2022-04-01 ENCOUNTER — Ambulatory Visit (INDEPENDENT_AMBULATORY_CARE_PROVIDER_SITE_OTHER): Payer: Medicare Other

## 2022-04-01 ENCOUNTER — Telehealth: Payer: Medicare Other

## 2022-04-01 DIAGNOSIS — I5032 Chronic diastolic (congestive) heart failure: Secondary | ICD-10-CM

## 2022-04-01 DIAGNOSIS — E1140 Type 2 diabetes mellitus with diabetic neuropathy, unspecified: Secondary | ICD-10-CM

## 2022-04-01 DIAGNOSIS — I5189 Other ill-defined heart diseases: Secondary | ICD-10-CM

## 2022-04-01 DIAGNOSIS — G894 Chronic pain syndrome: Secondary | ICD-10-CM

## 2022-04-01 DIAGNOSIS — M792 Neuralgia and neuritis, unspecified: Secondary | ICD-10-CM

## 2022-04-01 DIAGNOSIS — E1169 Type 2 diabetes mellitus with other specified complication: Secondary | ICD-10-CM

## 2022-04-01 DIAGNOSIS — I10 Essential (primary) hypertension: Secondary | ICD-10-CM

## 2022-04-01 NOTE — Chronic Care Management (AMB) (Signed)
?Chronic Care Management  ? ?CCM RN Visit Note ? ?04/01/2022 ?Name: Tonya [name] MRN: [id number] DOB: 08/23/1946 ? ?Subjective: ?Tonya [name] is a 76 y.o. year old female who is a primary care patient of Cannady, [name], NP. The care management team was consulted for assistance with disease management and care coordination needs.   ? ?Engaged with patient by telephone for follow up visit in response to provider referral for case management and/or care coordination services.  ? ?Consent to Services:  ?The patient was given information about Chronic Care Management services, agreed to services, and gave verbal consent prior to initiation of services.  Please see initial visit note for detailed documentation.  ? ?Patient agreed to services and verbal consent obtained.  ? ?Assessment: Review of patient past medical history, allergies, medications, health status, including review of consultants reports, laboratory and other test data, was performed as part of comprehensive evaluation and provision of chronic care management services.  ? ?SDOH (Social Determinants of Health) assessments and interventions performed:   ? ?CCM Care Plan ? ?Allergies  ?Allergen Reactions  ? Atorvastatin   ?  Myalgias ?  ? Gabapentin Other (See Comments)  ?  Speech impairment   ? Pravastatin   ?  Myalgias ?  ? Pregabalin   ?  Speech impairment  ? Trulicity [Dulaglutide] Hives  ? ? ?Outpatient Encounter Medications as of 04/01/2022  ?Medication Sig  ? aspirin 81 MG tablet Take 81 mg by mouth daily.  ? carvedilol (COREG) 3.125 MG tablet TAKE 1 TABLET BY MOUTH  TWICE DAILY WITH A MEAL  ? clopidogrel (PLAVIX) 75 MG tablet TAKE 1 TABLET BY MOUTH  DAILY  ? docusate sodium (COLACE) 100 MG capsule Take 100 mg by mouth daily as needed for mild constipation.  ? ferrous sulfate 325 (65 FE) MG tablet Take 325 mg by mouth daily with breakfast.  ? HUMALOG KWIKPEN 100 UNIT/ML KiwkPen 6 Units. Take 6 units before breakfast, lunch, supper if glucose  greater than 70  ? NOVOFINE 32G X 6 MM MISC USE AS DIRECTED THREE TIMES DAILY WITH HUMALOG  ? olmesartan (BENICAR) 40 MG tablet Take 1 tablet (40 mg total) by mouth daily.  ? omeprazole (PRILOSEC) 20 MG capsule TAKE 1 CAPSULE BY MOUTH  TWICE DAILY  ? pioglitazone (ACTOS) 30 MG tablet Take 30 mg by mouth daily.  ? rosuvastatin (CRESTOR) 5 MG tablet TAKE 1 TABLET BY MOUTH  DAILY  ? Torsemide 40 MG TABS Take by mouth.  ? vitamin B-12 (CYANOCOBALAMIN) 100 MCG tablet Take 100 mcg by mouth daily.  ? Vitamin D, Cholecalciferol, 50 MCG (2000 UT) CAPS Take 2,000 Units by mouth daily.  ? ?No facility-administered encounter medications on file as of 04/01/2022.  ? ? ?Patient Active Problem List  ? Diagnosis Date Noted  ? H/O sarcoidosis 01/29/2022  ? Ganglion cyst of wrist, left 01/29/2022  ? Lumbar degenerative disc disease 06/07/2020  ? Lumbar spondylosis 06/07/2020  ? Chronic pain syndrome 04/18/2020  ? Sacroiliac joint pain 04/18/2020  ? Chronic heart failure with preserved ejection fraction (HFpEF) ([place]) 01/11/2020  ? Grade I diastolic dysfunction 35/70/1779  ? History of CVA (cerebrovascular accident) 12/01/2019  ? Hyperparathyroidism due to renal insufficiency ([place]) 08/31/2019  ? Anemia in CKD (chronic kidney disease) 02/22/2019  ? 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 Advanced care planning/counseling discussion 04/13/2017  ? Post herpetic neuralgia 10/10/2016  ? Hip bursitis 09/18/2015  ? Poorly controlled type 2  diabetes mellitus with neuropathy ([place]) 05/01/2015  ? Hyperlipidemia associated with type 2 diabetes mellitus ([place]) 05/01/2015  ? Chronic kidney disease, stage 4, severely decreased GFR ([place]) 05/01/2015  ? Encounter for long-term (current) use of insulin ([place]) 05/01/2015  ? ? ?Conditions to be addressed/monitored:CHF, HTN, HLD, DMII, and chronic pain ? ?Care Plan : RNCM; General Plan of Care (Adult) for Chronic Disease Management and Care Coordination Needs  ?Updates made by  Vanita Ingles, RN since 04/01/2022 12:00 AM  ?  ? ?Problem: RNCM: Development for Plan of Care for Chronic Disease Management (DM, HTN, HLD, HF, Chronic Pain)   ?Priority: High  ?  ? ?Long-Range Goal: RNCM: Effective Management for Plan of Care for Chronic Disease Management (DM, HTN, HLD, HF, Chronic Pain)   ?Start Date: 10/29/2021  ?Expected End Date: 10/29/2022  ?Priority: High  ?Note:   ?Current Barriers:  ?Knowledge Deficits related to plan of care for management of CHF, HTN, HLD, DMII, and Chronic Pain   ?Chronic Disease Management support and education needs related to CHF, HTN, HLD, DMII, and chronic pain  ? ?RNCM Clinical Goal(s):  ?Patient will verbalize understanding of plan for management of CHF, HTN, HLD, DMII, and chronic pain  as evidenced by seeing providers on a regular basis, taking medications as ordered, following plan of care and calling offie for changes ?take all medications exactly as prescribed and will call provider for medication related questions as evidenced by compliance and calling the pharmacy before running out of medications     ?attend all scheduled medical appointments: 07-29-2022, has specialist appointments coming up as evidenced by keeping appointments and calling the office for changes that need to be made if unable to make appointment         ?demonstrate improved and ongoing health management independence as evidenced by working with the CCM team to optimize health and well being        ?demonstrate a decrease in CHF, HTN, HLD, DMII, and Chronic pain  exacerbations  as evidenced by following plan of care, working with the CCM team to effectively manage health and well being  ?demonstrate ongoing self health care management ability for effective management of chronic conditions  as evidenced by  working with the CCM team through collaboration with Consulting civil engineer, provider, and care team.  ? ?Interventions: ?1:1 collaboration with primary care provider regarding development  and update of comprehensive plan of care as evidenced by provider attestation and co-signature ?Inter-disciplinary care team collaboration (see longitudinal plan of care) ?Evaluation of current treatment plan related to  self management and patient's adherence to plan as established by provider ? ? ?SDOH Barriers (Status: Goal on Track (progressing): YES.) Long Term Goal  ?Patient interviewed and SDOH assessment performed ?       ?Patient interviewed and appropriate assessments performed ?Provided patient with information about resources available and to call the office for changes in SDOH or new needs  ?Discussed plans with patient for ongoing care management follow up and provided patient with direct contact information for care management team ?Advised patient to call the office for changes in SDOH, questions, or concerns ? ? ? ?Heart Failure Interventions:  (Status: Goal on Track (progressing): YES.)  Long Term Goal  ?Wt Readings from Last 3 Encounters:  ?04/01/22 170 lb (77.1 kg)  ?03/28/22 172 lb (78 kg)  ?02/04/22 163 lb (73.9 kg)  ?  ?Basic overview and discussion of pathophysiology of Heart Failure reviewed. 04-01-2022: Education about sx and sx  to monitor for possible CHF exacerbation. The patient states that she is doing well other than she has noticed more swelling in her feet and legs. She says that she has tried the compression hose but they are too tight on her legs. She does put her sketchers on and this helps a lot with the swelling. Education provided.  ?Provided education on low sodium diet. 04-01-2022: Review of heart healthy/ADA diet. The patient states she is watching what she eats and denies any acute issues with dietary restrictions. Discussed monitoring for hidden sodiums in food, especially when eating out.  ?Reviewed Heart Failure Action Plan in depth and provided written copy ?Assessed need for readable accurate scales in home. 04-01-2022: the patient states that she has a scale. She weighed  this am and her weight was 170. Has had about a 10 pound weight gain since February. Education on monitoring weight on a consistent basis to catch subtle changes in water weight and possibly prevent an exa

## 2022-04-01 NOTE — Patient Instructions (Signed)
Visit Information ? ?Thank you for taking time to visit with me today. Please don't hesitate to contact me if I can be of assistance to you before our next scheduled telephone appointment. ? ?Following are the goals we discussed today:  ?RNCM Clinical Goal(s):  ?Patient will verbalize understanding of plan for management of CHF, HTN, HLD, DMII, and chronic pain  as evidenced by seeing providers on a regular basis, taking medications as ordered, following plan of care and calling offie for changes ?take all medications exactly as prescribed and will call provider for medication related questions as evidenced by compliance and calling the pharmacy before running out of medications     ?attend all scheduled medical appointments: 07-29-2022, has specialist appointments coming up as evidenced by keeping appointments and calling the office for changes that need to be made if unable to make appointment         ?demonstrate improved and ongoing health management independence as evidenced by working with the CCM team to optimize health and well being        ?demonstrate a decrease in CHF, HTN, HLD, DMII, and Chronic pain  exacerbations  as evidenced by following plan of care, working with the CCM team to effectively manage health and well being  ?demonstrate ongoing self health care management ability for effective management of chronic conditions  as evidenced by  working with the CCM team through collaboration with Consulting civil engineer, provider, and care team.  ?  ?Interventions: ?1:1 collaboration with primary care provider regarding development and update of comprehensive plan of care as evidenced by provider attestation and co-signature ?Inter-disciplinary care team collaboration (see longitudinal plan of care) ?Evaluation of current treatment plan related to  self management and patient's adherence to plan as established by provider ?  ?  ?SDOH Barriers (Status: Goal on Track (progressing): YES.) Long Term Goal  ?Patient  interviewed and SDOH assessment performed ?       ?Patient interviewed and appropriate assessments performed ?Provided patient with information about resources available and to call the office for changes in SDOH or new needs  ?Discussed plans with patient for ongoing care management follow up and provided patient with direct contact information for care management team ?Advised patient to call the office for changes in SDOH, questions, or concerns ?  ?  ?  ?Heart Failure Interventions:  (Status: Goal on Track (progressing): YES.)  Long Term Goal  ?   ?Wt Readings from Last 3 Encounters:  ?04/01/22 170 lb (77.1 kg)  ?03/28/22 172 lb (78 kg)  ?02/04/22 163 lb (73.9 kg)  ?  ?Basic overview and discussion of pathophysiology of Heart Failure reviewed. 04-01-2022: Education about sx and sx to monitor for possible CHF exacerbation. The patient states that she is doing well other than she has noticed more swelling in her feet and legs. She says that she has tried the compression hose but they are too tight on her legs. She does put her sketchers on and this helps a lot with the swelling. Education provided.  ?Provided education on low sodium diet. 04-01-2022: Review of heart healthy/ADA diet. The patient states she is watching what she eats and denies any acute issues with dietary restrictions. Discussed monitoring for hidden sodiums in food, especially when eating out.  ?Reviewed Heart Failure Action Plan in depth and provided written copy ?Assessed need for readable accurate scales in home. 04-01-2022: the patient states that she has a scale. She weighed this am and her weight was 170.  Has had about a 10 pound weight gain since February. Education on monitoring weight on a consistent basis to catch subtle changes in water weight and possibly prevent an exacerbation of HF. ?Provided education about placing scale on hard, flat surface ?Advised patient to weigh each morning after emptying bladder. 04-01-2022: Review  ?Discussed  importance of daily weight and advised patient to weigh and record daily. 04-01-2022: Review of the benefits of daily weights and calling the provider for +2 weight gain in one day, or +5 in one week. The patient verbalized understanding.  ?Reviewed role of diuretics in prevention of fluid overload and management of heart failure. 04-01-2022: Confirmed that the patient is taking her Torsemide.  ?Discussed the importance of keeping all appointments with provider. 11-26-2021: Saw pcp recently and will see specialist soon for further evaluation of raspy voice, has been working with speech therapy. Denies any issues with HF. Will have a pulmonary appointment coming up for further evaluation of raspy voice as the speech therapist feels it has something to do with pulmonary. Saw pcp on 01-29-2022: 04-01-2022: Saw pulmonary provider on 03-28-2022 and there is no indication that her raspy voice is coming from pulmonary issues. She states they do not know what is causing it. She will follow up with the pulmonary provider in July and  next appointment with pcp on 07-29-2022 at 240 pm ?Provided patient with education about the role of exercise in the management of heart failure ?Advised patient to discuss HF and heart health with provider ?Screening for signs and symptoms of depression related to chronic disease state  ?Assessed social determinant of health barriers ?Evaluation of edema and wearing compression stockings. The patient states she is not using these. Encouraged by the pcp to start wearing. RNCM recommended trying the [employer] compression socks for a different alternative to the compression hose. Wrote down the information for the patient and where to look into getting these socks. Encouraged the patient that this would also help with her neuropathy. 02-04-2022: The patient denies any swelling in her feet and legs. States her HF is stable. Will continue to monitor. She is still not using compression stocking.  Encouraged use and ask the patient to elevate her legs when sitting down. She states her edema is stable and not more than usual. 04-01-2022: The patient tried the compression stockings but states these were too tight on her legs. She is putting her sketchers on and this is helping her a lot. The patient states that she has not been on her feet a lot. Education on the sx and sx to look for for exacerbation of heart failure and to elevated feet and legs when sitting down. The patient verbalized understanding.  ?  ?Diabetes:  (Status: Goal on Track (progressing): YES.) Long Term Goal  ?  ?     ?Lab Results  ?Component Value Date  ?  HGBA1C 8.2 (H) 01/29/2021  ?08-30-2021: 9.1 at Endocrinologist. January 2023 at endocrinology 9.2% ?Assessed patient's understanding of A1c goal: <7%. 04-01-2022: Review of goal of A1C level ?Provided education to patient about basic DM disease process; ?Reviewed medications with patient and discussed importance of medication adherence. 04-01-2022: The patient states compliance with medications;        ?Reviewed prescribed diet with patient heart healthy/ADA. 04-01-2022: The patient is compliant with a heart healthy/ADA diet ; ?Counseled on importance of regular laboratory monitoring as prescribed. 04-01-2022: The patient has regular lab testing.   ?Discussed plans with patient for ongoing care management  follow up and provided patient with direct contact information for care management team;      ?Provided patient with written educational materials related to hypo and hyperglycemia and importance of correct treatment. 10-29-2021: The patient had a low of 40 and 55 on 10-21-2021: Has had 7 lows over the last 90 days. Education on keeping gel with her in her pocket book and at home for lows. The patient does eat peanut butter and drinks something that will help bring it back up when she is at home. Education and support given.   11-26-2021: Denies any lows at this time. States her blood sugars are  consistently around 112.   02-04-2022: The patient has not been checking her blood sugars this past week. She ran out of supplies. They shipped on 02-02-2022 so she should have them soon. Denies any sx and sx o

## 2022-04-03 ENCOUNTER — Other Ambulatory Visit: Payer: Self-pay | Admitting: Cardiovascular Disease

## 2022-04-03 ENCOUNTER — Other Ambulatory Visit: Payer: Self-pay | Admitting: Nurse Practitioner

## 2022-04-04 ENCOUNTER — Other Ambulatory Visit: Payer: Self-pay | Admitting: Nurse Practitioner

## 2022-04-04 ENCOUNTER — Other Ambulatory Visit: Payer: Self-pay

## 2022-04-04 DIAGNOSIS — D869 Sarcoidosis, unspecified: Secondary | ICD-10-CM

## 2022-04-06 DIAGNOSIS — E1165 Type 2 diabetes mellitus with hyperglycemia: Secondary | ICD-10-CM | POA: Diagnosis not present

## 2022-04-06 DIAGNOSIS — Z794 Long term (current) use of insulin: Secondary | ICD-10-CM | POA: Diagnosis not present

## 2022-04-07 DIAGNOSIS — E1165 Type 2 diabetes mellitus with hyperglycemia: Secondary | ICD-10-CM | POA: Diagnosis not present

## 2022-04-07 DIAGNOSIS — Z794 Long term (current) use of insulin: Secondary | ICD-10-CM | POA: Diagnosis not present

## 2022-04-08 ENCOUNTER — Ambulatory Visit
Admission: RE | Admit: 2022-04-08 | Discharge: 2022-04-08 | Disposition: A | Discharge status: Home / Self Care | Payer: Medicare Other | Source: Ambulatory Visit | Attending: Pulmonary Disease | Admitting: Pulmonary Disease

## 2022-04-08 DIAGNOSIS — I2510 Atherosclerotic heart disease of native coronary artery without angina pectoris: Secondary | ICD-10-CM | POA: Diagnosis not present

## 2022-04-08 DIAGNOSIS — R1310 Dysphagia, unspecified: Secondary | ICD-10-CM | POA: Diagnosis not present

## 2022-04-08 DIAGNOSIS — D869 Sarcoidosis, unspecified: Secondary | ICD-10-CM | POA: Insufficient documentation

## 2022-04-08 DIAGNOSIS — K449 Diaphragmatic hernia without obstruction or gangrene: Secondary | ICD-10-CM | POA: Diagnosis not present

## 2022-04-13 DIAGNOSIS — I509 Heart failure, unspecified: Secondary | ICD-10-CM

## 2022-04-13 DIAGNOSIS — E1159 Type 2 diabetes mellitus with other circulatory complications: Secondary | ICD-10-CM

## 2022-04-13 DIAGNOSIS — Z7984 Long term (current) use of oral hypoglycemic drugs: Secondary | ICD-10-CM

## 2022-04-13 DIAGNOSIS — E785 Hyperlipidemia, unspecified: Secondary | ICD-10-CM

## 2022-04-13 DIAGNOSIS — I110 Hypertensive heart disease with heart failure: Secondary | ICD-10-CM

## 2022-04-14 DIAGNOSIS — N184 Chronic kidney disease, stage 4 (severe): Secondary | ICD-10-CM | POA: Diagnosis not present

## 2022-04-14 DIAGNOSIS — E1122 Type 2 diabetes mellitus with diabetic chronic kidney disease: Secondary | ICD-10-CM | POA: Diagnosis not present

## 2022-04-14 DIAGNOSIS — E1121 Type 2 diabetes mellitus with diabetic nephropathy: Secondary | ICD-10-CM | POA: Diagnosis not present

## 2022-04-14 DIAGNOSIS — E1129 Type 2 diabetes mellitus with other diabetic kidney complication: Secondary | ICD-10-CM | POA: Diagnosis not present

## 2022-04-14 DIAGNOSIS — R809 Proteinuria, unspecified: Secondary | ICD-10-CM | POA: Diagnosis not present

## 2022-04-14 DIAGNOSIS — E1165 Type 2 diabetes mellitus with hyperglycemia: Secondary | ICD-10-CM | POA: Diagnosis not present

## 2022-04-14 DIAGNOSIS — Z794 Long term (current) use of insulin: Secondary | ICD-10-CM | POA: Diagnosis not present

## 2022-04-15 ENCOUNTER — Inpatient Hospital Stay: Payer: Medicare Other

## 2022-04-15 ENCOUNTER — Encounter: Payer: Self-pay | Admitting: Oncology

## 2022-04-15 ENCOUNTER — Telehealth: Payer: Self-pay | Admitting: Pulmonary Disease

## 2022-04-15 ENCOUNTER — Inpatient Hospital Stay: Payer: Medicare Other | Attending: Oncology | Admitting: Oncology

## 2022-04-15 VITALS — BP 135/82 | HR 90 | Temp 97.4°F | Wt 170.0 lb

## 2022-04-15 DIAGNOSIS — N184 Chronic kidney disease, stage 4 (severe): Secondary | ICD-10-CM

## 2022-04-15 DIAGNOSIS — Z79899 Other long term (current) drug therapy: Secondary | ICD-10-CM | POA: Diagnosis not present

## 2022-04-15 DIAGNOSIS — K449 Diaphragmatic hernia without obstruction or gangrene: Secondary | ICD-10-CM

## 2022-04-15 DIAGNOSIS — D631 Anemia in chronic kidney disease: Secondary | ICD-10-CM

## 2022-04-15 DIAGNOSIS — D638 Anemia in other chronic diseases classified elsewhere: Secondary | ICD-10-CM | POA: Insufficient documentation

## 2022-04-15 LAB — COMPREHENSIVE METABOLIC PANEL
ALT: 12 U/L (ref 0–44)
AST: 21 U/L (ref 15–41)
Albumin: 3.2 g/dL — ABNORMAL LOW (ref 3.5–5.0)
Alkaline Phosphatase: 57 U/L (ref 38–126)
Anion gap: 6 (ref 5–15)
BUN: 39 mg/dL — ABNORMAL HIGH (ref 8–23)
CO2: 25 mmol/L (ref 22–32)
Calcium: 8.8 mg/dL — ABNORMAL LOW (ref 8.9–10.3)
Chloride: 109 mmol/L (ref 98–111)
Creatinine, Ser: 2.26 mg/dL — ABNORMAL HIGH (ref 0.44–1.00)
GFR, Estimated: 22 mL/min — ABNORMAL LOW (ref >60)
Glucose, Bld: 278 mg/dL — ABNORMAL HIGH (ref 70–99)
Potassium: 4.6 mmol/L (ref 3.5–5.1)
Sodium: 140 mmol/L (ref 135–145)
Total Bilirubin: 0.6 mg/dL (ref 0.3–1.2)
Total Protein: 6.3 g/dL — ABNORMAL LOW (ref 6.5–8.1)

## 2022-04-15 LAB — CBC WITH DIFFERENTIAL/PLATELET
Abs Immature Granulocytes: 0.02 10*3/uL (ref 0.00–0.07)
Basophils Absolute: 0 10*3/uL (ref 0.0–0.1)
Basophils Relative: 1 %
Eosinophils Absolute: 0.2 10*3/uL (ref 0.0–0.5)
Eosinophils Relative: 4 %
HCT: 27.7 % — ABNORMAL LOW (ref 36.0–46.0)
Hemoglobin: 8.5 g/dL — ABNORMAL LOW (ref 12.0–15.0)
Immature Granulocytes: 0 %
Lymphocytes Relative: 21 %
Lymphs Abs: 1.2 10*3/uL (ref 0.7–4.0)
MCH: 27.2 pg (ref 26.0–34.0)
MCHC: 30.7 g/dL (ref 30.0–36.0)
MCV: 88.8 fL (ref 80.0–100.0)
Monocytes Absolute: 0.4 10*3/uL (ref 0.1–1.0)
Monocytes Relative: 7 %
Neutro Abs: 3.7 10*3/uL (ref 1.7–7.7)
Neutrophils Relative %: 67 %
Platelets: 280 10*3/uL (ref 150–400)
RBC: 3.12 MIL/uL — ABNORMAL LOW (ref 3.87–5.11)
RDW: 15.1 % (ref 11.5–15.5)
WBC: 5.6 10*3/uL (ref 4.0–10.5)
nRBC: 0 % (ref 0.0–0.2)

## 2022-04-15 LAB — RETIC PANEL
Immature Retic Fract: 18.7 % — ABNORMAL HIGH (ref 2.3–15.9)
RBC.: 3.09 MIL/uL — ABNORMAL LOW (ref 3.87–5.11)
Retic Count, Absolute: 60.6 10*3/uL (ref 19.0–186.0)
Retic Ct Pct: 2 % (ref 0.4–3.1)
Reticulocyte Hemoglobin: 29.5 pg (ref >27.9)

## 2022-04-15 LAB — TECHNOLOGIST SMEAR REVIEW: Plt Morphology: ADEQUATE

## 2022-04-15 LAB — IRON AND TIBC
Iron: 91 ug/dL (ref 28–170)
Saturation Ratios: 23 % (ref 10.4–31.8)
TIBC: 395 ug/dL (ref 250–450)
UIBC: 304 ug/dL

## 2022-04-15 LAB — FERRITIN: Ferritin: 14 ng/mL (ref 11–307)

## 2022-04-15 NOTE — Telephone Encounter (Signed)
Her chest CT does not show any sarcoidosis of the lungs.  However she does have a very LARGE hiatal hernia and there is evidence also of a dilated esophagus with food all the way to the throat.  This may be part of the issues that she is having with her voice and swallowing.  Recommend that we refer her to GI ([facility]) here in [place] or [place].  She is likely going to need evaluation with endoscopy. ? ?Patient is aware of results and voiced her understanding.  ?Referral placed to GI. ?Nothing further needed.  ? ?

## 2022-04-15 NOTE — Progress Notes (Signed)
?Hematology/Oncology Progress note ?[phone] [phone] ?  ?# ? ? ?Patient Care Team: ?[name], NP as PCP - General (Nurse Practitioner) ?[name], MD as PCP - Cardiology (Cardiology) ?Judi Cong, MD as Physician Assistant (Endocrinology) ?Lavonia Dana, MD as Consulting Physician (Nephrology) ?[name], MD as Consulting Physician (Cardiology) ?Vanita Ingles, RN as Case Manager (General Practice) ?Pa, [facility]) ? ?REFERRING PROVIDER: ?Dr.Cannady ?CHIEF COMPLAINTS/REASON FOR VISIT:  ?Follow-up for anemia ? ?HISTORY OF PRESENTING ILLNESS:  ?Tonya [name] is a  76 y.o.  female with PMH listed below who was referred to me for evaluation of anemia ?Reviewed patient's recent labs that was done at [facility] office. Labs revealed anemia with hemoglobin of 8.9, MCV 82, normal platelet and wbc ? ?Reviewed patient's previous labs ordered by primary care physician's office, anemia is chronic onset , duration is since 2017 ?No aggravating or improving factors. ? ?Associated signs and symptoms: Patient reports fatigue.  SOB with exertion.  ?Denies weight loss, easy bruising, hematochezia, hemoptysis, hematuria. ?Context: History of GI bleeding: denies ?              History of Chronic kidney disease: CKD, follows up with Dr.Kolluru.  ?              History of autoimmune disease: denies ?              History of hemolytic anemia. denies ?              Last colonoscopy: underwent colonoscopy on 06/06/2019.  Due to poor preparation, patient was scheduled to repeat colonoscopy due to suboptimal bowel preparation.  Patient told me that she is waiting for insurance approval for repeat procedure. ? ?INTERVAL HISTORY ?Tonya [name] is a 76 y.o. female who has above history reviewed by me today presents to reestablish care for chronic anemia. ?Patient was last seen by me on 12/27/2019.  She has previously received IV Venofer treatments and tolerated well ?Lost  follow-up after last visit. ?Patient reports chronic fatigue, unchanged.  Denies any melena or hematochezia. 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 ? ?Patient had colonoscopy done on 01/16/2020. ? ?Review of Systems  ?Constitutional:  Positive for fatigue. Negative for appetite change, chills and fever.  ?HENT:   Negative for hearing loss and voice change.   ?Eyes:  Negative for eye problems.  ?Respiratory:  Negative for chest tightness and cough.   ?Cardiovascular:  Negative for chest pain.  ?Gastrointestinal:  Negative for abdominal distention, abdominal pain and blood in stool.  ?Endocrine: Negative for hot flashes.  ?Genitourinary:  Negative for difficulty urinating and frequency.   ?Musculoskeletal:  Negative for arthralgias.  ?Skin:  Negative for itching and rash.  ?Neurological:  Negative for extremity weakness.  ?Hematological:  Negative for adenopathy.  ?Psychiatric/Behavioral:  Negative for confusion.   ? ?MEDICAL HISTORY:  ?Past Medical History:  ?Diagnosis Date  ? (HFpEF) heart failure with preserved ejection fraction ([place])   ? a. 05/2017 Echo: EF 55-60%, Gr1 DD, mildly dil LA.  ? CKD (chronic kidney disease), stage III ([place])   ? Diabetes mellitus without complication ([place])   ? Hyperlipidemia   ? Hypertension   ? Stroke [facility])   ? a. 08/2017.  ? Wears dentures   ? partial upper  ? ? ?SURGICAL HISTORY: ?Past Surgical History:  ?Procedure Laterality Date  ? CATARACT EXTRACTION W/PHACO Left 02/05/2021  ? Procedure: CATARACT EXTRACTION PHACO AND INTRAOCULAR  LENS PLACEMENT (IOC) LEFT DIABETIC 5.47 00:51.4;  Surgeon: Birder Robson, MD;  Location: [place];  Service: Ophthalmology;  Laterality: Left;  Diabetic - insulin and oral meds  ? CATARACT EXTRACTION W/PHACO Right 03/19/2021  ? Procedure: CATARACT EXTRACTION PHACO AND INTRAOCULAR LENS PLACEMENT (IOC) RIGHT DIABETIC 6.09 00:39.6;  Surgeon: Birder Robson, MD;  Location: [place];  Service: Ophthalmology;   Laterality: Right;  ? COLONOSCOPY  12/15/2006  ? COLONOSCOPY WITH PROPOFOL N/A 06/06/2019  ? Procedure: COLONOSCOPY WITH PROPOFOL;  Surgeon: [name], MD;  Location: [facility] ENDOSCOPY;  Service: Gastroenterology;  Laterality: N/A;  ? COLONOSCOPY WITH PROPOFOL N/A 01/16/2020  ? Procedure: COLONOSCOPY WITH BIOPSY;  Surgeon: [name], MD;  Location: [place];  Service: Endoscopy;  Laterality: N/A;  Diabetic - insulin and oral meds  ? POLYPECTOMY N/A 01/16/2020  ? Procedure: POLYPECTOMY;  Surgeon: [name], MD;  Location: [place];  Service: Endoscopy;  Laterality: N/A;  ? TUBAL LIGATION    ? ? ?SOCIAL HISTORY: ?Social History  ? ?Socioeconomic History  ? Marital status: Married  ?  Spouse name: Not on file  ? Number of children: Not on file  ? Years of education: Not on file  ? Highest education level: GED or equivalent  ?Occupational History  ? Occupation: retired  ?Tobacco Use  ? Smoking status: Never  ? Smokeless tobacco: Never  ?Vaping Use  ? Vaping Use: Never used  ?Substance and Sexual Activity  ? Alcohol use: No  ?  Alcohol/week: 0.0 standard drinks  ? Drug use: No  ? Sexual activity: Yes  ?Other Topics Concern  ? Not on file  ?Social History Narrative  ? Not on file  ? ?Social Determinants of Health  ? ?Financial Resource Strain: Low Risk   ? Difficulty of Paying Living Expenses: Not hard at all  ?Food Insecurity: No Food Insecurity  ? Worried About Charity fundraiser in the Last Year: Never true  ? Ran Out of Food in the Last Year: Never true  ?Transportation Needs: No Transportation Needs  ? Lack of Transportation (Medical): No  ? Lack of Transportation (Non-Medical): No  ?Physical Activity: Inactive  ? Days of Exercise per Week: 0 days  ? Minutes of Exercise per Session: 0 min  ?Stress: No Stress Concern Present  ? Feeling of Stress : Not at all  ?Social Connections: Socially Integrated  ? Frequency of Communication with Friends and Family: Twice a week  ? Frequency of Social Gatherings  with Friends and Family: Twice a week  ? Attends Religious Services: More than 4 times per year  ? Active Member of Clubs or Organizations: Yes  ? Attends Archivist Meetings: 1 to 4 times per year  ? Marital Status: Married  ?Intimate Partner Violence: Not At Risk  ? Fear of Current or Ex-Partner: No  ? Emotionally Abused: No  ? Physically Abused: No  ? Sexually Abused: No  ? ? ?FAMILY HISTORY: ?Family History  ?Problem Relation Age of Onset  ? Diabetes Mother   ? Stroke Mother   ? Hyperlipidemia Father   ? Hypertension Father   ? Diabetes Sister   ? Diabetes Brother   ? Gout Son   ? Diabetes Brother   ? Kidney disease Son   ? Lymphoma Sister   ? Heart attack Sister   ? Breast cancer Neg Hx   ? ? ?ALLERGIES:  is allergic to atorvastatin, gabapentin, pravastatin, pregabalin, and trulicity [dulaglutide]. ? ?MEDICATIONS:  ?Current  Outpatient Medications  ?Medication Sig Dispense Refill  ? aspirin 81 MG tablet Take 81 mg by mouth daily.    ? carvedilol (COREG) 3.125 MG tablet TAKE 1 TABLET BY MOUTH TWICE  DAILY WITH A MEAL 180 tablet 0  ? clopidogrel (PLAVIX) 75 MG tablet TAKE 1 TABLET BY MOUTH  DAILY 90 tablet 4  ? docusate sodium (COLACE) 100 MG capsule Take 100 mg by mouth daily as needed for mild constipation.    ? ferrous sulfate 325 (65 FE) MG tablet Take 325 mg by mouth daily with breakfast.    ? HUMALOG KWIKPEN 100 UNIT/ML KiwkPen 6 Units. Take 6 units before breakfast, lunch, supper if glucose greater than 70  11  ? NOVOFINE 32G X 6 MM MISC USE AS DIRECTED THREE TIMES DAILY WITH HUMALOG 100 each 3  ? olmesartan (BENICAR) 40 MG tablet Take 1 tablet (40 mg total) by mouth daily. 90 tablet 4  ? omeprazole (PRILOSEC) 20 MG capsule TAKE 1 CAPSULE BY MOUTH  TWICE DAILY 180 capsule 2  ? pioglitazone (ACTOS) 30 MG tablet Take 30 mg by mouth daily.    ? rosuvastatin (CRESTOR) 5 MG tablet TAKE 1 TABLET BY MOUTH DAILY 90 tablet 0  ? Torsemide 40 MG TABS Take by mouth.    ? vitamin B-12 (CYANOCOBALAMIN) 100 MCG  tablet Take 100 mcg by mouth daily.    ? Vitamin D, Cholecalciferol, 50 MCG (2000 UT) CAPS Take 2,000 Units by mouth daily.    ? ?No current facility-administered medications for this visit.  ? ? ? ?PHYSICAL

## 2022-04-16 ENCOUNTER — Telehealth: Payer: Self-pay

## 2022-04-16 NOTE — Telephone Encounter (Signed)
Called and left detailed message of lab results and Dr. Collie Siad recommendation to start IV venofer weekly x 4. Advised to follow up with Dr. [name] in 3 months with lab prior to check level. Informed patient via VM we will get theses scheduled and notify her of date and time. Advised to call back with any questions or concerns.  ? ? ?Please schedule patient for: ?IV venofer weekly x4 ?Lab in 3 months ?MD/+/- venofer 2 days after. ?Please notify patient of appt. Thanks ?

## 2022-04-16 NOTE — Telephone Encounter (Signed)
-----   Message from Earlie Server, MD sent at 04/15/2022 11:28 PM EDT ----- ?Recommend patient to get IV venofer.  ?Venofer weekly x4 ?Labs in 3 months, 2 days prior to MD +/- Venofer +/- Retacrit. Labs are ordered.  ?

## 2022-04-17 DIAGNOSIS — E113312 Type 2 diabetes mellitus with moderate nonproliferative diabetic retinopathy with macular edema, left eye: Secondary | ICD-10-CM | POA: Diagnosis not present

## 2022-04-18 ENCOUNTER — Telehealth: Payer: Self-pay

## 2022-04-18 NOTE — Chronic Care Management (AMB) (Signed)
Chronic Care Management Pharmacy Assistant   Name: Tonya [name]  MRN: [id number] DOB: Jul 17, 1946  Reason for Encounter: Disease State General    Recent office visits:  01/29/22-Tonya T. Ned Card, NP (PCP) General follow up visit. Labs ordered. Ambulatory referral to Home Health. Ambulatory referral to Orthopedics. Ambulatory referral to Pulmonology. Follow up in 6 months. 10/29/21-Tonya T. Ned Card, NP (PCP) Diabetic follow up. Labs ordered. Flu vaccine given. Follow up in 3 months.  Recent consult visits:  04/15/22-[name] [name], MD (Oncology) Follow up for anemia. Follow up in 4 weeks.  04/14/22-[name] Lavone Orn, MD (Endocrinology) Follow up for anemia. Follow up in 4 weeks.  03/28/22-Carmen Veda Canning, MD (Pulmonology) Consult visit. Follow up in 3 months. 02/05/22-Sarath Kolluru, MD (Nephrology) Follow up visit. Labs ordered. Follow up in 3 months. 12/19/21-[name] Lavone Orn, MD (Endocrinology) Follow up visit. Follow up in 12 weeks.  11/22/21-[name] M. Solum (Endocrinology) Notes not available. 11/11/21-William Porfolio (Ophthalmology) Notes not available.  11/01/21-Creighton Vaught (Otolaryngoogy) Notes not available.  12/22/20-Sarath Kolluru, MD (Nephrology) Follow up visit. Labs ordered. Follow up in 3 months.  Hospital visits:  None in previous 6 months  Medications: Outpatient Encounter Medications as of 04/18/2022  Medication Sig   aspirin 81 MG tablet Take 81 mg by mouth daily.   carvedilol (COREG) 3.125 MG tablet TAKE 1 TABLET BY MOUTH TWICE  DAILY WITH A MEAL   clopidogrel (PLAVIX) 75 MG tablet TAKE 1 TABLET BY MOUTH  DAILY   docusate sodium (COLACE) 100 MG capsule Take 100 mg by mouth daily as needed for mild constipation.   ferrous sulfate 325 (65 FE) MG tablet Take 325 mg by mouth daily with breakfast.   HUMALOG KWIKPEN 100 UNIT/ML KiwkPen 6 Units. Take 6 units before breakfast, lunch, supper if glucose greater than 70   NOVOFINE 32G X 6 MM MISC USE AS DIRECTED  THREE TIMES DAILY WITH HUMALOG   olmesartan (BENICAR) 40 MG tablet Take 1 tablet (40 mg total) by mouth daily.   omeprazole (PRILOSEC) 20 MG capsule TAKE 1 CAPSULE BY MOUTH  TWICE DAILY   pioglitazone (ACTOS) 30 MG tablet Take 30 mg by mouth daily.   rosuvastatin (CRESTOR) 5 MG tablet TAKE 1 TABLET BY MOUTH DAILY   Torsemide 40 MG TABS Take by mouth.   vitamin B-12 (CYANOCOBALAMIN) 100 MCG tablet Take 100 mcg by mouth daily.   Vitamin D, Cholecalciferol, 50 MCG (2000 UT) CAPS Take 2,000 Units by mouth daily.   No facility-administered encounter medications on file as of 04/18/2022.   [place] for General Review Call   Chart Review:  Have there been any documented new, changed, or discontinued medications since last visit? No (If yes, include name, dose, frequency, date)  Has there been any documented recent hospitalizations or ED visits since last visit with Clinical Pharmacist? No    Adherence Review:  Does the Clinical Pharmacist Assistant have access to adherence rates? Yes  Adherence rates for STAR metric medications (List medication(s)/day supply/ last 2 fill dates). See star list below the note  Does the patient have >5 day gap between last estimated fill dates for any of the above medications or other medication gaps? No    Disease State Questions:  Able to connect with Patient? Yes Did patient have any problems with their health recently? No Note problems and Concerns:Patient states she has been doing good no new issues.  Have you had any admissions or emergency room visits or worsening of your condition(s) since  last visit? No Details of ED visit, hospital visit and/or worsening condition(s):N/a  Have you had any visits with new specialists or providers since your last visit? Yes Explain:Consultation with pulmonologist.  Have you had any new health care problem(s) since your last visit? No New problem(s) reported:Patient states she has no new  problems or issues.  Have you run out of any of your medications since you last spoke with clinical pharmacist? No What caused you to run out of your medications? Patient states she is up to date with her medicaitons.  Are there any medications you are not taking as prescribed? No What kept you from taking your medications as prescribed? N/a  Are you having any issues or side effects with your medications? No Note of issues or side effects:Patient states she has no side effects to any of her medications.  Do you have any other health concerns or questions you want to discuss with your Clinical Pharmacist before your next visit? No Note additional concerns and questions from Patient. Patient states she has no additional concerns.  Are there any health concerns that you feel we can do a better job addressing? No Note Patient's response. Patient states there is nothing ar this time.  Are you having any problems with any of the following since the last visit: (select all that apply)  None  Details:N/a  12. Any falls since last visit? No  Details:N//a  13. Any increased or uncontrolled pain since last visit? No  Details:N/a  14. Next visit Type: office       Visit with:Infusion visit        Date:05/08/22        Time:2:15pm  15. Additional Details? No    Care Gaps: None noted  Star Rating Drugs: Rosuvastatin 5 mg Last filled:02/06/22 90 DS  Myriam Elta [place], [place]

## 2022-04-22 ENCOUNTER — Ambulatory Visit: Payer: Medicare Other | Admitting: Gastroenterology

## 2022-04-22 NOTE — Telephone Encounter (Signed)
Spoke to patient. She had additional questions regarding GI referral.  ?She is aware that GI will call to schedule appt. I have also provided her with GI's contact number.  ?She voiced her understanding.   ?Nothing further needed.  ?

## 2022-04-24 ENCOUNTER — Inpatient Hospital Stay: Payer: Medicare Other

## 2022-04-24 VITALS — BP 128/63 | HR 85 | Temp 96.8°F | Resp 16

## 2022-04-24 DIAGNOSIS — D631 Anemia in chronic kidney disease: Secondary | ICD-10-CM

## 2022-04-24 DIAGNOSIS — N184 Chronic kidney disease, stage 4 (severe): Secondary | ICD-10-CM | POA: Diagnosis not present

## 2022-04-24 DIAGNOSIS — Z79899 Other long term (current) drug therapy: Secondary | ICD-10-CM | POA: Diagnosis not present

## 2022-04-24 MED ORDER — SODIUM CHLORIDE 0.9 % IV SOLN: 200.0000 mg | Freq: Once | INTRAVENOUS | Status: DC

## 2022-04-24 MED ORDER — IRON SUCROSE 20 MG/ML IV SOLN
200.0000 mg | Freq: Once | INTRAVENOUS | Status: AC
  Administered 2022-04-24: 200 mg via INTRAVENOUS
  Filled 2022-04-24: qty 10

## 2022-04-24 MED ORDER — SODIUM CHLORIDE 0.9 % IV SOLN
Freq: Once | INTRAVENOUS | Status: AC
  Filled 2022-04-24: qty 250

## 2022-04-24 NOTE — Patient Instructions (Signed)

## 2022-05-01 ENCOUNTER — Inpatient Hospital Stay: Payer: Medicare Other

## 2022-05-01 VITALS — BP 112/62 | HR 84 | Temp 97.2°F | Resp 18

## 2022-05-01 DIAGNOSIS — Z79899 Other long term (current) drug therapy: Secondary | ICD-10-CM | POA: Diagnosis not present

## 2022-05-01 DIAGNOSIS — N184 Chronic kidney disease, stage 4 (severe): Secondary | ICD-10-CM | POA: Diagnosis not present

## 2022-05-01 DIAGNOSIS — D631 Anemia in chronic kidney disease: Secondary | ICD-10-CM | POA: Diagnosis not present

## 2022-05-01 MED ORDER — SODIUM CHLORIDE 0.9 % IV SOLN: 200.0000 mg | Freq: Once | INTRAVENOUS | Status: DC

## 2022-05-01 MED ORDER — IRON SUCROSE 20 MG/ML IV SOLN
200.0000 mg | Freq: Once | INTRAVENOUS | Status: AC
  Administered 2022-05-01: 200 mg via INTRAVENOUS
  Filled 2022-05-01: qty 10

## 2022-05-01 MED ORDER — SODIUM CHLORIDE 0.9 % IV SOLN
Freq: Once | INTRAVENOUS | Status: AC
  Filled 2022-05-01: qty 250

## 2022-05-01 NOTE — Patient Instructions (Signed)
[facility]  Discharge Instructions: ?Thank you for choosing [facility] to provide your oncology and hematology care.  ?If you have a lab appointment with the Cancer Center, please go directly to the Cancer Center and check in at the registration area. ? ?Wear comfortable clothing and clothing appropriate for easy access to any Portacath or PICC line.  ? ?We strive to give you quality time with your provider. You may need to reschedule your appointment if you arrive late (15 or more minutes).  Arriving late affects you and other patients whose appointments are after yours.  Also, if you miss three or more appointments without notifying the office, you may be dismissed from the clinic at the provider?s discretion.    ?  ?For prescription refill requests, have your pharmacy contact our office and allow 72 hours for refills to be completed.   ? ?Today you received the following chemotherapy and/or immunotherapy agents VENOFER ?    ?  ?To help prevent nausea and vomiting after your treatment, we encourage you to take your nausea medication as directed. ? ?BELOW ARE SYMPTOMS THAT SHOULD BE REPORTED IMMEDIATELY: ?*FEVER GREATER THAN 100.4 F (38 ?C) OR HIGHER ?*CHILLS OR SWEATING ?*NAUSEA AND VOMITING THAT IS NOT CONTROLLED WITH YOUR NAUSEA MEDICATION ?*UNUSUAL SHORTNESS OF BREATH ?*UNUSUAL BRUISING OR BLEEDING ?*URINARY PROBLEMS (pain or burning when urinating, or frequent urination) ?*BOWEL PROBLEMS (unusual diarrhea, constipation, pain near the anus) ?TENDERNESS IN MOUTH AND THROAT WITH OR WITHOUT PRESENCE OF ULCERS (sore throat, sores in mouth, or a toothache) ?UNUSUAL RASH, SWELLING OR PAIN  ?UNUSUAL VAGINAL DISCHARGE OR ITCHING  ? ?Items with * indicate a potential emergency and should be followed up as soon as possible or go to the Emergency Department if any problems should occur. ? ?Please show the CHEMOTHERAPY ALERT CARD or IMMUNOTHERAPY ALERT CARD at check-in to  the Emergency Department and triage nurse. ? ?Should you have questions after your visit or need to cancel or reschedule your appointment, please contact [facility]  [phone] and follow the prompts.  Office hours are 8:00 a.m. to 4:30 p.m. Monday - Friday. Please note that voicemails left after 4:00 p.m. may not be returned until the following business day.  We are closed weekends and major holidays. You have access to a nurse at all times for urgent questions. Please call the main number to the clinic [phone] and follow the prompts. ? ?For any non-urgent questions, you may also contact your provider using MyChart. We now offer e-Visits for anyone 18 and older to request care online for non-urgent symptoms. For details visit [email or web address]. ?  ?Also download the MyChart app! Go to the app store, search "MyChart", open the app, select [facility], and log in with your MyChart username and password. ? ?Due to Covid, a mask is required upon entering the hospital/clinic. If you do not have a mask, one will be given to you upon arrival. For doctor visits, patients may have 1 support person aged 18 or older with them. For treatment visits, patients cannot have anyone with them due to current Covid guidelines and our immunocompromised population.  ? ?Iron Sucrose Injection ?What is this medication? ?IRON SUCROSE (EYE ern SOO krose) treats low levels of iron (iron deficiency anemia) in people with kidney disease. Iron is a mineral that plays an important role in making red blood cells, which carry oxygen from your lungs to the rest of your body. ?This medicine   may be used for other purposes; ask your health care provider or pharmacist if you have questions. ?COMMON BRAND NAME(S): Venofer ?What should I tell my care team before I take this medication? ?They need to know if you have any of these conditions: ?Anemia not caused by low iron levels ?Heart disease ?High levels of  iron in the blood ?Kidney disease ?Liver disease ?An unusual or allergic reaction to iron, other medications, foods, dyes, or preservatives ?Pregnant or trying to get pregnant ?Breast-feeding ?How should I use this medication? ?This medication is for infusion into a vein. It is given in a hospital or clinic setting. ?Talk to your care team about the use of this medication in children. While this medication may be prescribed for children as young as 2 years for selected conditions, precautions do apply. ?Overdosage: If you think you have taken too much of this medicine contact a poison control center or emergency room at once. ?NOTE: This medicine is only for you. Do not share this medicine with others. ?What if I miss a dose? ?It is important not to miss your dose. Call your care team if you are unable to keep an appointment. ?What may interact with this medication? ?Do not take this medication with any of the following: ?Deferoxamine ?Dimercaprol ?Other iron products ?This medication may also interact with the following: ?Chloramphenicol ?Deferasirox ?This list may not describe all possible interactions. Give your health care provider a list of all the medicines, herbs, non-prescription drugs, or dietary supplements you use. Also tell them if you smoke, drink alcohol, or use illegal drugs. Some items may interact with your medicine. ?What should I watch for while using this medication? ?Visit your care team regularly. Tell your care team if your symptoms do not start to get better or if they get worse. You may need blood work done while you are taking this medication. ?You may need to follow a special diet. Talk to your care team. Foods that contain iron include: whole grains/cereals, dried fruits, beans, or peas, leafy green vegetables, and organ meats (liver, kidney). ?What side effects may I notice from receiving this medication? ?Side effects that you should report to your care team as soon as  possible: ?Allergic reactions--skin rash, itching, hives, swelling of the face, lips, tongue, or throat ?Low blood pressure--dizziness, feeling faint or lightheaded, blurry vision ?Shortness of breath ?Side effects that usually do not require medical attention (report to your care team if they continue or are bothersome): ?Flushing ?Headache ?Joint pain ?Muscle pain ?Nausea ?Pain, redness, or irritation at injection site ?This list may not describe all possible side effects. Call your doctor for medical advice about side effects. You may report side effects to FDA at 1-800-FDA-1088. ?Where should I keep my medication? ?This medication is given in a hospital or clinic and will not be stored at home. ?NOTE: This sheet is a summary. It may not cover all possible information. If you have questions about this medicine, talk to your doctor, pharmacist, or health care provider. ?? 2023 [employer] (2021-04-26 00:00:00) ? ?

## 2022-05-08 ENCOUNTER — Inpatient Hospital Stay: Payer: Medicare Other

## 2022-05-08 VITALS — BP 138/59 | HR 79 | Temp 96.1°F | Resp 18

## 2022-05-08 DIAGNOSIS — D631 Anemia in chronic kidney disease: Secondary | ICD-10-CM

## 2022-05-08 DIAGNOSIS — Z79899 Other long term (current) drug therapy: Secondary | ICD-10-CM | POA: Diagnosis not present

## 2022-05-08 DIAGNOSIS — N184 Chronic kidney disease, stage 4 (severe): Secondary | ICD-10-CM | POA: Diagnosis not present

## 2022-05-08 MED ORDER — IRON SUCROSE 20 MG/ML IV SOLN
200.0000 mg | Freq: Once | INTRAVENOUS | Status: AC
  Administered 2022-05-08: 200 mg via INTRAVENOUS
  Filled 2022-05-08: qty 10

## 2022-05-08 MED ORDER — SODIUM CHLORIDE 0.9 % IV SOLN: 200.0000 mg | Freq: Once | INTRAVENOUS | Status: DC

## 2022-05-08 MED ORDER — SODIUM CHLORIDE 0.9 % IV SOLN
Freq: Once | INTRAVENOUS | Status: AC
  Filled 2022-05-08: qty 250

## 2022-05-08 NOTE — Patient Instructions (Signed)

## 2022-05-14 DIAGNOSIS — E1165 Type 2 diabetes mellitus with hyperglycemia: Secondary | ICD-10-CM | POA: Diagnosis not present

## 2022-05-14 DIAGNOSIS — D631 Anemia in chronic kidney disease: Secondary | ICD-10-CM | POA: Diagnosis not present

## 2022-05-14 DIAGNOSIS — I10 Essential (primary) hypertension: Secondary | ICD-10-CM | POA: Diagnosis not present

## 2022-05-14 DIAGNOSIS — E1122 Type 2 diabetes mellitus with diabetic chronic kidney disease: Secondary | ICD-10-CM | POA: Diagnosis not present

## 2022-05-14 DIAGNOSIS — N2581 Secondary hyperparathyroidism of renal origin: Secondary | ICD-10-CM | POA: Diagnosis not present

## 2022-05-14 DIAGNOSIS — Z794 Long term (current) use of insulin: Secondary | ICD-10-CM | POA: Diagnosis not present

## 2022-05-14 DIAGNOSIS — N184 Chronic kidney disease, stage 4 (severe): Secondary | ICD-10-CM | POA: Diagnosis not present

## 2022-05-15 ENCOUNTER — Inpatient Hospital Stay: Payer: Medicare Other | Attending: Oncology

## 2022-05-15 VITALS — BP 112/70 | HR 79 | Temp 97.2°F | Resp 18

## 2022-05-15 DIAGNOSIS — N184 Chronic kidney disease, stage 4 (severe): Secondary | ICD-10-CM | POA: Insufficient documentation

## 2022-05-15 DIAGNOSIS — D631 Anemia in chronic kidney disease: Secondary | ICD-10-CM | POA: Insufficient documentation

## 2022-05-15 DIAGNOSIS — D509 Iron deficiency anemia, unspecified: Secondary | ICD-10-CM | POA: Diagnosis not present

## 2022-05-15 MED ORDER — IRON SUCROSE 20 MG/ML IV SOLN
200.0000 mg | Freq: Once | INTRAVENOUS | Status: AC
  Administered 2022-05-15: 200 mg via INTRAVENOUS
  Filled 2022-05-15: qty 10

## 2022-05-15 MED ORDER — SODIUM CHLORIDE 0.9 % IV SOLN: 200.0000 mg | Freq: Once | INTRAVENOUS | Status: DC

## 2022-05-15 MED ORDER — SODIUM CHLORIDE 0.9 % IV SOLN
Freq: Once | INTRAVENOUS | Status: AC
  Filled 2022-05-15: qty 250

## 2022-06-03 ENCOUNTER — Ambulatory Visit (INDEPENDENT_AMBULATORY_CARE_PROVIDER_SITE_OTHER): Payer: Medicare Other

## 2022-06-03 ENCOUNTER — Telehealth: Payer: Medicare Other

## 2022-06-03 DIAGNOSIS — I5032 Chronic diastolic (congestive) heart failure: Secondary | ICD-10-CM

## 2022-06-03 DIAGNOSIS — I10 Essential (primary) hypertension: Secondary | ICD-10-CM

## 2022-06-03 DIAGNOSIS — E1140 Type 2 diabetes mellitus with diabetic neuropathy, unspecified: Secondary | ICD-10-CM

## 2022-06-03 DIAGNOSIS — G894 Chronic pain syndrome: Secondary | ICD-10-CM

## 2022-06-03 DIAGNOSIS — E1169 Type 2 diabetes mellitus with other specified complication: Secondary | ICD-10-CM

## 2022-06-03 NOTE — Chronic Care Management (AMB) (Signed)
Chronic Care Management   CCM RN Visit Note  06/03/2022 Name: Tonya [name] MRN: [id number] DOB: 1946/02/23  Subjective: Tonya [name] is a 76 y.o. year old female who is a primary care patient of Cannady, [name], NP. The care management team was consulted for assistance with disease management and care coordination needs.    Engaged with patient by telephone for follow up visit in response to provider referral for case management and/or care coordination services.   Consent to Services:  The patient was given information about Chronic Care Management services, agreed to services, and gave verbal consent prior to initiation of services.  Please see initial visit note for detailed documentation.   Patient agreed to services and verbal consent obtained.   Assessment: Review of patient past medical history, allergies, medications, health status, including review of consultants reports, laboratory and other test data, was performed as part of comprehensive evaluation and provision of chronic care management services.   SDOH (Social Determinants of Health) assessments and interventions performed:    CCM Care Plan  Allergies  Allergen Reactions   Atorvastatin     Myalgias    Gabapentin Other (See Comments)    Speech impairment    Pravastatin     Myalgias    Pregabalin     Speech impairment   Trulicity [Dulaglutide] Hives    Outpatient Encounter Medications as of 06/03/2022  Medication Sig   aspirin 81 MG tablet Take 81 mg by mouth daily.   carvedilol (COREG) 3.125 MG tablet TAKE 1 TABLET BY MOUTH TWICE  DAILY WITH A MEAL   clopidogrel (PLAVIX) 75 MG tablet TAKE 1 TABLET BY MOUTH  DAILY   docusate sodium (COLACE) 100 MG capsule Take 100 mg by mouth daily as needed for mild constipation.   ferrous sulfate 325 (65 FE) MG tablet Take 325 mg by mouth daily with breakfast.   HUMALOG KWIKPEN 100 UNIT/ML KiwkPen 6 Units. Take 6 units before breakfast, lunch, supper if glucose  greater than 70   NOVOFINE 32G X 6 MM MISC USE AS DIRECTED THREE TIMES DAILY WITH HUMALOG   olmesartan (BENICAR) 40 MG tablet Take 1 tablet (40 mg total) by mouth daily.   omeprazole (PRILOSEC) 20 MG capsule TAKE 1 CAPSULE BY MOUTH  TWICE DAILY   pioglitazone (ACTOS) 30 MG tablet Take 30 mg by mouth daily.   rosuvastatin (CRESTOR) 5 MG tablet TAKE 1 TABLET BY MOUTH DAILY   Torsemide 40 MG TABS Take by mouth.   vitamin B-12 (CYANOCOBALAMIN) 100 MCG tablet Take 100 mcg by mouth daily.   Vitamin D, Cholecalciferol, 50 MCG (2000 UT) CAPS Take 2,000 Units by mouth daily.   No facility-administered encounter medications on file as of 06/03/2022.    Patient Active Problem List   Diagnosis Date Noted   Anemia in stage 4 chronic kidney disease ([employer]) 04/15/2022   H/O sarcoidosis 01/29/2022   Ganglion cyst of wrist, left 01/29/2022   Lumbar degenerative disc disease 06/07/2020   Lumbar spondylosis 06/07/2020   Chronic pain syndrome 04/18/2020   Sacroiliac joint pain 04/18/2020   Chronic heart failure with preserved ejection fraction (HFpEF) (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[place]) 09/22/2017   Advanced care planning/counseling discussion 04/13/2017   Post herpetic neuralgia 10/10/2016  Hip bursitis 09/18/2015   Poorly controlled type 2 diabetes mellitus with neuropathy ([name]) 05/01/2015   Hyperlipidemia associated with type 2 diabetes mellitus ([name]) 05/01/2015   Chronic kidney disease, stage 4, severely decreased GFR ([place]) 05/01/2015   Encounter for long-term (current) use of insulin ([place]) 05/01/2015    Conditions to be addressed/monitored:CHF, HTN, HLD, DMII, and Chronic pain  Care Plan : RNCM; General Plan of Care (Adult) for Chronic  Disease Management and Care Coordination Needs  Updates made by Vanita Ingles, RN since 06/03/2022 12:00 AM     Problem: RNCM: Development for Plan of Care for Chronic Disease Management (DM, HTN, HLD, HF, Chronic Pain)   Priority: High     Long-Range Goal: RNCM: Effective Management for Plan of Care for Chronic Disease Management (DM, HTN, HLD, HF, Chronic Pain) Completed 06/03/2022  Start Date: 10/29/2021  Expected End Date: 10/29/2022  Priority: High  Note:   Current Barriers: 06-03-2022: The patient has met the goals of care and the goals and care plan are being closed Knowledge Deficits related to plan of care for management of CHF, HTN, HLD, DMII, and Chronic Pain   Chronic Disease Management support and education needs related to CHF, HTN, HLD, DMII, and chronic pain   RNCM Clinical Goal(s):  Patient will verbalize understanding of plan for management of CHF, HTN, HLD, DMII, and chronic pain  as evidenced by seeing providers on a regular basis, taking medications as ordered, following plan of care and calling offie for changes take all medications exactly as prescribed and will call provider for medication related questions as evidenced by compliance and calling the pharmacy before running out of medications     attend all scheduled medical appointments: 07-29-2022, has specialist appointments coming up as evidenced by keeping appointments and calling the office for changes that need to be made if unable to make appointment         demonstrate improved and ongoing health management independence as evidenced by working with the CCM team to optimize health and well being        demonstrate a decrease in CHF, HTN, HLD, DMII, and Chronic pain  exacerbations  as evidenced by following plan of care, working with the CCM team to effectively manage health and well being  demonstrate ongoing self health care management ability for effective management of chronic conditions  as evidenced by  working  with the CCM team through collaboration with Consulting civil engineer, provider, and care team.   Interventions: 1:1 collaboration with primary care provider regarding development and update of comprehensive plan of care as evidenced by provider attestation and co-signature Inter-disciplinary care team collaboration (see longitudinal plan of care) Evaluation of current treatment plan related to  self management and patient's adherence to plan as established by provider   SDOH Barriers (Status: Goal on Track (progressing): YES.) Long Term Goal  Patient interviewed and SDOH assessment performed        Patient interviewed and appropriate assessments performed Provided patient with information about resources available and to call the office for changes in SDOH or new needs  Discussed plans with patient for ongoing care management follow up and provided patient with direct contact information for care management team Advised patient to call the office for changes in [place], questions, or concerns    Heart Failure Interventions:  (Status: Goal Met.)  Long Term Goal 06-03-2022: The patient has met the plan of care and goals have been met.  Wt Readings from Last 3  Encounters:  04/15/22 170 lb (77.1 kg)  04/01/22 170 lb (77.1 kg)  03/28/22 172 lb (78 kg)    Basic overview and discussion of pathophysiology of Heart Failure reviewed. 04-01-2022: Education about sx and sx to monitor for possible CHF exacerbation. The patient states that she is doing well other than she has noticed more swelling in her feet and legs. She says that she has tried the compression hose but they are too tight on her legs. She does put her sketchers on and this helps a lot with the swelling. Education provided. 06-03-2022: The patient has stable condition and no issues related to HF at this time. Knows to call for changes Provided education on low sodium diet. 06-03-2022: Review of heart healthy/ADA diet. The patient states she is watching  what she eats and denies any acute issues with dietary restrictions. Discussed monitoring for hidden sodiums in food, especially when eating out.  Reviewed Heart Failure Action Plan in depth and provided written copy Assessed need for readable accurate scales in home. 04-01-2022: the patient states that she has a scale. She weighed this am and her weight was 170. Has had about a 10 pound weight gain since February. Education on monitoring weight on a consistent basis to catch subtle changes in water weight and possibly prevent an exacerbation of HF. 06-03-2022: The patient is stable and denies any weight changes at this time. Denies any acute changes in HF. Provided education about placing scale on hard, flat surface Advised patient to weigh each morning after emptying bladder. 06-03-2022: Review  Discussed importance of daily weight and advised patient to weigh and record daily. 06-03-2022: Review of the benefits of daily weights and calling the provider for +2 weight gain in one day, or +5 in one week. The patient verbalized understanding.  Reviewed role of diuretics in prevention of fluid overload and management of heart failure. 06-03-2022: Confirmed that the patient is taking her Torsemide.  Discussed the importance of keeping all appointments with provider. 11-26-2021: Saw pcp recently and will see specialist soon for further evaluation of raspy voice, has been working with speech therapy. Denies any issues with HF. Will have a pulmonary appointment coming up for further evaluation of raspy voice as the speech therapist feels it has something to do with pulmonary. Saw pcp on 01-29-2022: 04-01-2022: Saw pulmonary provider on 03-28-2022 and there is no indication that her raspy voice is coming from pulmonary issues. She states they do not know what is causing it. She will follow up with the pulmonary provider in July and  next appointment with pcp on 07-29-2022 at 240 pm. 06-03-2022: The patient's appointment with  pcp is 07-29-2022 at 240 pm Provided patient with education about the role of exercise in the management of heart failure Advised patient to discuss HF and heart health with provider Screening for signs and symptoms of depression related to chronic disease state  Assessed social determinant of health barriers Evaluation of edema and wearing compression stockings. The patient states she is not using these. Encouraged by the pcp to start wearing. RNCM recommended trying the [employer] compression socks for a different alternative to the compression hose. Wrote down the information for the patient and where to look into getting these socks. Encouraged the patient that this would also help with her neuropathy. 02-04-2022: The patient denies any swelling in her feet and legs. States her HF is stable. Will continue to monitor. She is still not using compression stocking. Encouraged use and ask  the patient to elevate her legs when sitting down. She states her edema is stable and not more than usual. 04-01-2022: The patient tried the compression stockings but states these were too tight on her legs. She is putting her sketchers on and this is helping her a lot. The patient states that she has not been on her feet a lot. Education on the sx and sx to look for for exacerbation of heart failure and to elevated feet and legs when sitting down. The patient verbalized understanding.   Diabetes:  (Status: Goal Met.) Long Term Goal  06-03-2022; Goals have been met and plan of care closed  Lab Results  Component Value Date   HGBA1C 8.2 (H) 01/29/2021  08-30-2021: 9.1 at Endocrinologist. January 2023 at endocrinology 9.2% Assessed patient's understanding of A1c goal: <7%. 06-03-2022: Review of goal of A1C level Provided education to patient about basic DM disease process; Reviewed medications with patient and discussed importance of medication adherence. 06-03-2022: The patient states compliance with medications;         Reviewed prescribed diet with patient heart healthy/ADA. 06-03-2022: The patient is compliant with a heart healthy/ADA diet ; Counseled on importance of regular laboratory monitoring as prescribed. 06-03-2022: The patient has regular lab testing.   Discussed plans with patient for ongoing care management follow up and provided patient with direct contact information for care management team;      Provided patient with written educational materials related to hypo and hyperglycemia and importance of correct treatment. 10-29-2021: The patient had a low of 40 and 55 on 10-21-2021: Has had 7 lows over the last 90 days. Education on keeping gel with her in her pocket book and at home for lows. The patient does eat peanut butter and drinks something that will help bring it back up when she is at home. Education and support given.   11-26-2021: Denies any lows at this time. States her blood sugars are consistently around 112.   02-04-2022: The patient has not been checking her blood sugars this past week. She ran out of supplies. They shipped on 02-02-2022 so she should have them soon. Denies any sx and sx of hyper or hypoglycemia. 04-01-2022: The patient gave several readings today and the lowest was 64 and the highest was 342. Reviewed scheduled/upcoming provider appointments including: 07-29-2022 with pcp,  has upcoming specialist appointments ;         Advised patient, providing education and rationale, to check cbg as directed, the patient has the freestyle [place]  and record . 10-29-2021: Average has been 186. The patients range has been 40 on 11-7 to 273.  Extensive education from the pcp today to discuss restarting long acting insulin.  The patient verbalized understanding. 02-04-2022: Encouraged the patient to check blood sugars per recommendations of the provider when she got her new supplies. The patient verbalized understanding. States that the last time she took it it was 110 and 112.   04-01-2022: The patient  gave several readings to the [facility] and is checking her blood sugars more consistently. The patient states her blood sugars fluctuate sometimes. Range has been 64 to 342 with an average of 194 out of 12 readings. This am fasting blood sugar was 98. Review of fasting <130 and post prandial of <180.  06-03-2022: States that her blood sugars this am were 80 and it has been around 85 fasting. call provider for findings outside established parameters. 11-26-2021: States that her blood sugars have been consistently 112 in  the ams. Denies any elevations or lows. 06-03-2022: Reminder to call the provider for out of range blood sugar readings. ;       Review of patient status, including review of consultants reports, relevant laboratory and other test results, and medications completed;       Advised patient to discuss medications  with provider.  The patient has not been taking her long acting insulin but has her short acting and is experiencing some lows. Education and support given      Evaluation of neuropathy pain. The patient sees Dr. Holley Raring as she cannot tolerate gabapentin or lyricia. Cymbalta did not offer any relief either. Pcp is going to discuss capzasin cream wraps to toes with Dr. Holley Raring as this is something he is trying in the office now to help with pain relief from neuropathy. 04-01-2022: Denies any more pain than usual. States she is not having any pain at the time of the call.  Eye exam done in January of 2023  Hyperlipidemia:  (Status: Goal Met.) Long Term Goal  Lab Results  Component Value Date   CHOL 176 01/29/2022   HDL 85 01/29/2022   LDLCALC 76 01/29/2022   LDLDIRECT 67 12/30/2018   TRIG 84 01/29/2022   CHOLHDL 2.5 12/02/2019   Medication review performed; medication list updated in electronic medical record. 06-03-2022: Is taking Crestor 5 mg daily as directed.  Provider established cholesterol goals reviewed. 06-03-2022: The patient has WNL lab work. The patient is doing well with  managing her cholesterol at this time. Praised for being at goal Counseled on importance of regular laboratory monitoring as prescribed. 06-03-2022: Has labs on a consistent basis.  Provided HLD educational materials; Reviewed role and benefits of statin for ASCVD risk reduction; Discussed strategies to manage statin-induced myalgias; Reviewed importance of limiting foods high in cholesterol. 06-03-2022: Review of heart healthy/ADA diet   Hypertension: (Status: Goal Met.) Last practice recorded BP readings:  BP Readings from Last 3 Encounters:  05/15/22 112/70  05/08/22 (!) 138/59  05/01/22 112/62  Most recent eGFR/CrCl: No results found for: "EGFR"  No components found for: CRCL EGFR in the nephrologist office was 21 Evaluation of current treatment plan related to hypertension self management and patient's adherence to plan as established by provider. 02-04-2022: The patient feels her blood pressure is better controlled. She denies any acute findings related to HTN health.  06-03-2022: The patient is having more stable blood pressures. Denies any issues with HTN  Provided education to patient re: stroke prevention, s/s of heart attack and stroke; Reviewed prescribed diet heart healthy/ADA diet  Reviewed medications with patient and discussed importance of compliance.06-03-2022: The patient is compliant with medications. Denies any medication needs;  Counseled on adverse effects of illicit drug and excessive alcohol use in patients with high blood pressure;  Discussed plans with patient for ongoing care management follow up and provided patient with direct contact information for care management team; Advised patient, providing education and rationale, to monitor blood pressure daily and record, calling PCP for findings outside established parameters;  Advised patient to discuss blood pressure trends  with provider; Provided education on prescribed diet heart healthy/ADA diet ;  Discussed  complications of poorly controlled blood pressure such as heart disease, stroke, circulatory complications, vision complications, kidney impairment, sexual dysfunction;   Pain:  (Status: Goal Met.) Long Term Goal 06-03-2022: goal met and being closed Pain assessment performed. 11-26-2021: States "0" pain at the time of the call. When she is active and over  does it it can be up to a 10. 02-04-2022: The patient rates her back pain and neuropathy pain at a 5 today. She will go to the orthopedic provider on 02-07-2022 for evaluation of left wrist pain due to a cyst. She states this has been there for a while but over time it has gotten worse. 06-03-2022: The patient denies pain today . The patient states when she is sitting she has no pain.  Medications reviewed. 06-03-2022: Is compliant with medications regimen  Reviewed provider established plan for pain management. 06-03-2022: Is compliant with plan of care for pain management  Discussed importance of adherence to all scheduled medical appointments. Next appointment with pcp is 07-29-2022 at 240 pm; Counseled on the importance of reporting any/all new or changed pain symptoms or management strategies to pain management provider; Advised patient to report to care team affect of pain on daily activities; Discussed use of relaxation techniques and/or diversional activities to assist with pain reduction (distraction, imagery, relaxation, massage, acupressure, TENS, heat, and cold application. 10-29-2021: The patient uses CBD oil and uses a spray to help with relaxing her and that has been effective in helping her rest. The pain is were she had shingles and also OA. The patient had an injection from the pain clinic but only was effective for a short period of time. The patient states that she is also have neuropathy pain in toes. The patient cannot tolerate gabapentin or Lyrica. The pcp to collaborate with pain provider. Discussed capzasin cream wraps for toes to see if  that would help with neuropathy pain. The patient is open to recommendations  Reviewed with patient prescribed pharmacological and nonpharmacological pain relief strategies; Advised patient to discuss unresolved pain, changes in intensity or level of pain  with provider;   Patient Goals/Self-Care Activities: Patient will self administer medications as prescribed as evidenced by self report/primary caregiver report  Patient will attend all scheduled provider appointments as evidenced by clinician review of documented attendance to scheduled appointments and patient/caregiver report Patient will call pharmacy for medication refills as evidenced by patient report and review of pharmacy fill history as appropriate Patient will attend church or other social activities as evidenced by patient report Patient will continue to perform ADL's independently as evidenced by patient/caregiver report Patient will continue to perform IADL's independently as evidenced by patient/caregiver report Patient will call provider office for new concerns or questions as evidenced by review of documented incoming telephone call notes and patient report Patient will work with BSW to address care coordination needs and will continue to work with the clinical team to address health care and disease management related needs as evidenced by documented adherence to scheduled care management/care coordination appointments call office if I gain more than 2 pounds in one day or 5 pounds in one week keep legs up while sitting track weight in diary use salt in moderation watch for swelling in feet, ankles and legs every day weigh myself daily develop a rescue plan follow rescue plan if symptoms flare-up eat more whole grains, fruits and vegetables, lean meats and healthy fats know when to call the doctorfor changes in HF, questions, or concerns track symptoms and what helps feel better or worse dress right for the weather, hot  or cold schedule appointment with eye doctor- eye exam in January of 2023, denies any new changes or needs related to vision check blood sugar at prescribed times: when you have symptoms of low or high blood sugar, before and after  exercise, and has freestyle libre and can check blood sugars frequently  check feet daily for cuts, sores or redness enter blood sugar readings and medication or insulin into daily log take the blood sugar meter to all doctor visits trim toenails straight across drink 6 to 8 glasses of water each day eat fish at least once per week fill half of plate with vegetables limit fast food meals to no more than 1 per week manage portion size prepare main meal at home 3 to 5 days each week read food labels for fat, fiber, carbohydrates and portion size reduce red meat to 2 to 3 times a week keep feet up while sitting wash and dry feet carefully every day wear comfortable, cotton socks wear comfortable, well-fitting shoes - check blood pressure daily - choose a place to take my blood pressure (home, clinic or office, retail store) - write blood pressure results in a log or diary - learn about high blood pressure - keep a blood pressure log - take blood pressure log to all doctor appointments - call doctor for signs and symptoms of high blood pressure - develop an action plan for high blood pressure - keep all doctor appointments - take medications for blood pressure exactly as prescribed - report new symptoms to your doctor - eat more whole grains, fruits and vegetables, lean meats and healthy fats - call for medicine refill 2 or 3 days before it runs out - take all medications exactly as prescribed - call doctor with any symptoms you believe are related to your medicine - call doctor when you experience any new symptoms - go to all doctor appointments as scheduled - adhere to prescribed diet: heart healthy/ADA diet        Plan:No further follow up required:  the patient is stable and goals of care have been met. Has the RNCM number to call for new changes or concerns.   [name] RN, MSN, [facility] Mobile: [phone]

## 2022-06-03 NOTE — Patient Instructions (Signed)
Visit Information  Thank you for taking time to visit with me today. Please don't hesitate to contact me if I can be of assistance to you before our next scheduled telephone appointment.  Following are the goals we discussed today:  Heart Failure Interventions:  (Status: Goal Met.)  Long Term Goal 06-03-2022: The patient has met the plan of care and goals have been met.     Wt Readings from Last 3 Encounters:  04/15/22 170 lb (77.1 kg)  04/01/22 170 lb (77.1 kg)  03/28/22 172 lb (78 kg)    Basic overview and discussion of pathophysiology of Heart Failure reviewed. 04-01-2022: Education about sx and sx to monitor for possible CHF exacerbation. The patient states that she is doing well other than she has noticed more swelling in her feet and legs. She says that she has tried the compression hose but they are too tight on her legs. She does put her sketchers on and this helps a lot with the swelling. Education provided. 06-03-2022: The patient has stable condition and no issues related to HF at this time. Knows to call for changes Provided education on low sodium diet. 06-03-2022: Review of heart healthy/ADA diet. The patient states she is watching what she eats and denies any acute issues with dietary restrictions. Discussed monitoring for hidden sodiums in food, especially when eating out.  Reviewed Heart Failure Action Plan in depth and provided written copy Assessed need for readable accurate scales in home. 04-01-2022: the patient states that she has a scale. She weighed this am and her weight was 170. Has had about a 10 pound weight gain since February. Education on monitoring weight on a consistent basis to catch subtle changes in water weight and possibly prevent an exacerbation of HF. 06-03-2022: The patient is stable and denies any weight changes at this time. Denies any acute changes in HF. Provided education about placing scale on hard, flat surface Advised patient to weigh each morning after  emptying bladder. 06-03-2022: Review  Discussed importance of daily weight and advised patient to weigh and record daily. 06-03-2022: Review of the benefits of daily weights and calling the provider for +2 weight gain in one day, or +5 in one week. The patient verbalized understanding.  Reviewed role of diuretics in prevention of fluid overload and management of heart failure. 06-03-2022: Confirmed that the patient is taking her Torsemide.  Discussed the importance of keeping all appointments with provider. 11-26-2021: Saw pcp recently and will see specialist soon for further evaluation of raspy voice, has been working with speech therapy. Denies any issues with HF. Will have a pulmonary appointment coming up for further evaluation of raspy voice as the speech therapist feels it has something to do with pulmonary. Saw pcp on 01-29-2022: 04-01-2022: Saw pulmonary provider on 03-28-2022 and there is no indication that her raspy voice is coming from pulmonary issues. She states they do not know what is causing it. She will follow up with the pulmonary provider in July and  next appointment with pcp on 07-29-2022 at 240 pm. 06-03-2022: The patient's appointment with pcp is 07-29-2022 at 240 pm Provided patient with education about the role of exercise in the management of heart failure Advised patient to discuss HF and heart health with provider Screening for signs and symptoms of depression related to chronic disease state  Assessed social determinant of health barriers Evaluation of edema and wearing compression stockings. The patient states she is not using these. Encouraged by the pcp to  start wearing. RNCM recommended trying the [employer] compression socks for a different alternative to the compression hose. Wrote down the information for the patient and where to look into getting these socks. Encouraged the patient that this would also help with her neuropathy. 02-04-2022: The patient denies any swelling in her  feet and legs. States her HF is stable. Will continue to monitor. She is still not using compression stocking. Encouraged use and ask the patient to elevate her legs when sitting down. She states her edema is stable and not more than usual. 04-01-2022: The patient tried the compression stockings but states these were too tight on her legs. She is putting her sketchers on and this is helping her a lot. The patient states that she has not been on her feet a lot. Education on the sx and sx to look for for exacerbation of heart failure and to elevated feet and legs when sitting down. The patient verbalized understanding.    Diabetes:  (Status: Goal Met.) Long Term Goal  06-03-2022; Goals have been met and plan of care closed        Lab Results  Component Value Date    HGBA1C 8.2 (H) 01/29/2021  08-30-2021: 9.1 at Endocrinologist. January 2023 at endocrinology 9.2% Assessed patient's understanding of A1c goal: <7%. 06-03-2022: Review of goal of A1C level Provided education to patient about basic DM disease process; Reviewed medications with patient and discussed importance of medication adherence. 06-03-2022: The patient states compliance with medications;        Reviewed prescribed diet with patient heart healthy/ADA. 06-03-2022: The patient is compliant with a heart healthy/ADA diet ; Counseled on importance of regular laboratory monitoring as prescribed. 06-03-2022: The patient has regular lab testing.   Discussed plans with patient for ongoing care management follow up and provided patient with direct contact information for care management team;      Provided patient with written educational materials related to hypo and hyperglycemia and importance of correct treatment. 10-29-2021: The patient had a low of 40 and 55 on 10-21-2021: Has had 7 lows over the last 90 days. Education on keeping gel with her in her pocket book and at home for lows. The patient does eat peanut butter and drinks something that will  help bring it back up when she is at home. Education and support given.   11-26-2021: Denies any lows at this time. States her blood sugars are consistently around 112.   02-04-2022: The patient has not been checking her blood sugars this past week. She ran out of supplies. They shipped on 02-02-2022 so she should have them soon. Denies any sx and sx of hyper or hypoglycemia. 04-01-2022: The patient gave several readings today and the lowest was 64 and the highest was 342. Reviewed scheduled/upcoming provider appointments including: 07-29-2022 with pcp,  has upcoming specialist appointments ;         Advised patient, providing education and rationale, to check cbg as directed, the patient has the freestyle [place]  and record . 10-29-2021: Average has been 186. The patients range has been 40 on 11-7 to 273.  Extensive education from the pcp today to discuss restarting long acting insulin.  The patient verbalized understanding. 02-04-2022: Encouraged the patient to check blood sugars per recommendations of the provider when she got her new supplies. The patient verbalized understanding. States that the last time she took it it was 110 and 112.   04-01-2022: The patient gave several readings to the [facility]  and is checking her blood sugars more consistently. The patient states her blood sugars fluctuate sometimes. Range has been 64 to 342 with an average of 194 out of 12 readings. This am fasting blood sugar was 98. Review of fasting <130 and post prandial of <180.  06-03-2022: States that her blood sugars this am were 80 and it has been around 85 fasting. call provider for findings outside established parameters. 11-26-2021: States that her blood sugars have been consistently 112 in the ams. Denies any elevations or lows. 06-03-2022: Reminder to call the provider for out of range blood sugar readings. ;       Review of patient status, including review of consultants reports, relevant laboratory and other test results, and  medications completed;       Advised patient to discuss medications  with provider.  The patient has not been taking her long acting insulin but has her short acting and is experiencing some lows. Education and support given      Evaluation of neuropathy pain. The patient sees Dr. Holley Raring as she cannot tolerate gabapentin or lyricia. Cymbalta did not offer any relief either. Pcp is going to discuss capzasin cream wraps to toes with Dr. Holley Raring as this is something he is trying in the office now to help with pain relief from neuropathy. 04-01-2022: Denies any more pain than usual. States she is not having any pain at the time of the call.  Eye exam done in January of 2023   Hyperlipidemia:  (Status: Goal Met.) Long Term Goal       Lab Results  Component Value Date    CHOL 176 01/29/2022    HDL 85 01/29/2022    LDLCALC 76 01/29/2022    LDLDIRECT 67 12/30/2018    TRIG 84 01/29/2022    CHOLHDL 2.5 12/02/2019    Medication review performed; medication list updated in electronic medical record. 06-03-2022: Is taking Crestor 5 mg daily as directed.  Provider established cholesterol goals reviewed. 06-03-2022: The patient has WNL lab work. The patient is doing well with managing her cholesterol at this time. Praised for being at goal Counseled on importance of regular laboratory monitoring as prescribed. 06-03-2022: Has labs on a consistent basis.  Provided HLD educational materials; Reviewed role and benefits of statin for ASCVD risk reduction; Discussed strategies to manage statin-induced myalgias; Reviewed importance of limiting foods high in cholesterol. 06-03-2022: Review of heart healthy/ADA diet    Hypertension: (Status: Goal Met.) Last practice recorded BP readings:     BP Readings from Last 3 Encounters:  05/15/22 112/70  05/08/22 (!) 138/59  05/01/22 112/62  Most recent eGFR/CrCl: No results found for: "EGFR"  No components found for: CRCL EGFR in the nephrologist office was 21 Evaluation  of current treatment plan related to hypertension self management and patient's adherence to plan as established by provider. 02-04-2022: The patient feels her blood pressure is better controlled. She denies any acute findings related to HTN health.  06-03-2022: The patient is having more stable blood pressures. Denies any issues with HTN  Provided education to patient re: stroke prevention, s/s of heart attack and stroke; Reviewed prescribed diet heart healthy/ADA diet  Reviewed medications with patient and discussed importance of compliance.06-03-2022: The patient is compliant with medications. Denies any medication needs;  Counseled on adverse effects of illicit drug and excessive alcohol use in patients with high blood pressure;  Discussed plans with patient for ongoing care management follow up and provided patient with direct contact information  for care management team; Advised patient, providing education and rationale, to monitor blood pressure daily and record, calling PCP for findings outside established parameters;  Advised patient to discuss blood pressure trends  with provider; Provided education on prescribed diet heart healthy/ADA diet ;  Discussed complications of poorly controlled blood pressure such as heart disease, stroke, circulatory complications, vision complications, kidney impairment, sexual dysfunction;    Pain:  (Status: Goal Met.) Long Term Goal 06-03-2022: goal met and being closed Pain assessment performed. 11-26-2021: States "0" pain at the time of the call. When she is active and over does it it can be up to a 10. 02-04-2022: The patient rates her back pain and neuropathy pain at a 5 today. She will go to the orthopedic provider on 02-07-2022 for evaluation of left wrist pain due to a cyst. She states this has been there for a while but over time it has gotten worse. 06-03-2022: The patient denies pain today . The patient states when she is sitting she has no pain.  Medications  reviewed. 06-03-2022: Is compliant with medications regimen  Reviewed provider established plan for pain management. 06-03-2022: Is compliant with plan of care for pain management  Discussed importance of adherence to all scheduled medical appointments. Next appointment with pcp is 07-29-2022 at 240 pm; Counseled on the importance of reporting any/all new or changed pain symptoms or management strategies to pain management provider; Advised patient to report to care team affect of pain on daily activities; Discussed use of relaxation techniques and/or diversional activities to assist with pain reduction (distraction, imagery, relaxation, massage, acupressure, TENS, heat, and cold application. 10-29-2021: The patient uses CBD oil and uses a spray to help with relaxing her and that has been effective in helping her rest. The pain is were she had shingles and also OA. The patient had an injection from the pain clinic but only was effective for a short period of time. The patient states that she is also have neuropathy pain in toes. The patient cannot tolerate gabapentin or Lyrica. The pcp to collaborate with pain provider. Discussed capzasin cream wraps for toes to see if that would help with neuropathy pain. The patient is open to recommendations  Reviewed with patient prescribed pharmacological and nonpharmacological pain relief strategies; Advised patient to discuss unresolved pain, changes in intensity or level of pain  with provider  No further follow up needed at this time. Knows to call for changes or new needs  Please call the care guide team at [phone] if you need to cancel or reschedule your appointment.   If you are experiencing a Mental Health or [place] or need someone to talk to, please call the Suicide and Crisis Lifeline: 988 call the [place] National Suicide Prevention Lifeline: 224-684-2588 or TTY: 825-694-1486 TTY 608-205-0136) to talk to a trained counselor call  1-800-273-TALK (toll free, 24 hour hotline)   Patient verbalizes understanding of instructions and care plan provided today and agrees to view in [place]. Active MyChart status and patient understanding of how to access instructions and care plan via MyChart confirmed with patient.     [name] RN, MSN, [facility] Mobile: [phone]

## 2022-06-06 DIAGNOSIS — E113312 Type 2 diabetes mellitus with moderate nonproliferative diabetic retinopathy with macular edema, left eye: Secondary | ICD-10-CM | POA: Diagnosis not present

## 2022-06-06 LAB — HM DIABETES EYE EXAM

## 2022-06-09 ENCOUNTER — Encounter: Payer: Self-pay | Admitting: Gastroenterology

## 2022-06-09 ENCOUNTER — Encounter: Payer: Self-pay | Admitting: Oncology

## 2022-06-09 ENCOUNTER — Other Ambulatory Visit: Payer: Self-pay

## 2022-06-09 ENCOUNTER — Ambulatory Visit: Payer: Medicare Other | Admitting: Gastroenterology

## 2022-06-09 VITALS — BP 127/78 | HR 91 | Temp 97.4°F | Ht 67.0 in | Wt 166.6 lb

## 2022-06-09 DIAGNOSIS — R935 Abnormal findings on diagnostic imaging of other abdominal regions, including retroperitoneum: Secondary | ICD-10-CM

## 2022-06-09 MED ORDER — OMEPRAZOLE 40 MG PO CPDR: 40.0000 mg | DELAYED_RELEASE_CAPSULE | Freq: Every day | ORAL | 0 refills | Status: DC

## 2022-06-09 MED ORDER — OMEPRAZOLE 40 MG PO CPDR: 40.0000 mg | DELAYED_RELEASE_CAPSULE | Freq: Two times a day (BID) | ORAL | 0 refills | Status: DC

## 2022-06-13 DIAGNOSIS — E785 Hyperlipidemia, unspecified: Secondary | ICD-10-CM | POA: Diagnosis not present

## 2022-06-13 DIAGNOSIS — E1159 Type 2 diabetes mellitus with other circulatory complications: Secondary | ICD-10-CM

## 2022-06-13 DIAGNOSIS — Z794 Long term (current) use of insulin: Secondary | ICD-10-CM | POA: Diagnosis not present

## 2022-06-13 DIAGNOSIS — I110 Hypertensive heart disease with heart failure: Secondary | ICD-10-CM

## 2022-06-13 DIAGNOSIS — I509 Heart failure, unspecified: Secondary | ICD-10-CM

## 2022-06-16 ENCOUNTER — Telehealth: Payer: Medicare Other

## 2022-06-19 ENCOUNTER — Telehealth: Payer: Self-pay | Admitting: Nurse Practitioner

## 2022-06-19 NOTE — Telephone Encounter (Signed)
Patient dropped off Blood Thinner Information Request form from [facility]  to be completed by provider. Form was place in the provider folder for completion.

## 2022-06-20 ENCOUNTER — Encounter: Payer: Self-pay | Admitting: Oncology

## 2022-06-20 ENCOUNTER — Encounter: Payer: Self-pay | Admitting: Gastroenterology

## 2022-06-20 NOTE — Telephone Encounter (Signed)
Spoke with patient and notified her of [name]'s recommendations. Patient says she thought it was supposed to be faxed back over to [facility]. Patient verbalized understanding and has no further questions.

## 2022-06-23 ENCOUNTER — Ambulatory Visit: Payer: Medicare Other | Admitting: Certified Registered"

## 2022-06-23 ENCOUNTER — Encounter: Payer: Self-pay | Admitting: Gastroenterology

## 2022-06-23 ENCOUNTER — Ambulatory Visit
Admission: RE | Admit: 2022-06-23 | Discharge: 2022-06-23 | Disposition: A | Discharge status: Home / Self Care | Payer: Medicare Other | Attending: Gastroenterology | Admitting: Gastroenterology

## 2022-06-23 ENCOUNTER — Encounter
Admission: RE | Disposition: A | Discharge status: Home / Self Care | Payer: Self-pay | Source: Home / Self Care | Attending: Gastroenterology

## 2022-06-23 DIAGNOSIS — K449 Diaphragmatic hernia without obstruction or gangrene: Secondary | ICD-10-CM | POA: Insufficient documentation

## 2022-06-23 DIAGNOSIS — E1122 Type 2 diabetes mellitus with diabetic chronic kidney disease: Secondary | ICD-10-CM | POA: Diagnosis not present

## 2022-06-23 DIAGNOSIS — I5032 Chronic diastolic (congestive) heart failure: Secondary | ICD-10-CM | POA: Diagnosis not present

## 2022-06-23 DIAGNOSIS — I130 Hypertensive heart and chronic kidney disease with heart failure and stage 1 through stage 4 chronic kidney disease, or unspecified chronic kidney disease: Secondary | ICD-10-CM | POA: Diagnosis not present

## 2022-06-23 DIAGNOSIS — R935 Abnormal findings on diagnostic imaging of other abdominal regions, including retroperitoneum: Secondary | ICD-10-CM | POA: Insufficient documentation

## 2022-06-23 DIAGNOSIS — Z8673 Personal history of transient ischemic attack (TIA), and cerebral infarction without residual deficits: Secondary | ICD-10-CM | POA: Insufficient documentation

## 2022-06-23 DIAGNOSIS — Z794 Long term (current) use of insulin: Secondary | ICD-10-CM | POA: Diagnosis not present

## 2022-06-23 DIAGNOSIS — N183 Chronic kidney disease, stage 3 unspecified: Secondary | ICD-10-CM | POA: Insufficient documentation

## 2022-06-23 DIAGNOSIS — I509 Heart failure, unspecified: Secondary | ICD-10-CM | POA: Diagnosis not present

## 2022-06-23 HISTORY — PX: ESOPHAGOGASTRODUODENOSCOPY (EGD) WITH PROPOFOL: SHX5813

## 2022-06-23 LAB — GLUCOSE, CAPILLARY: Glucose-Capillary: 138 mg/dL — ABNORMAL HIGH (ref 70–99)

## 2022-06-23 SURGERY — ESOPHAGOGASTRODUODENOSCOPY (EGD) WITH PROPOFOL
Anesthesia: General

## 2022-06-23 MED ORDER — PROPOFOL 500 MG/50ML IV EMUL
INTRAVENOUS | Status: DC | PRN
  Administered 2022-06-23: 100 ug/kg/min via INTRAVENOUS

## 2022-06-23 MED ORDER — SODIUM CHLORIDE 0.9 % IV SOLN: INTRAVENOUS | Status: DC

## 2022-06-23 MED ORDER — PROPOFOL 10 MG/ML IV BOLUS
INTRAVENOUS | Status: DC | PRN
  Administered 2022-06-23: 30 mg via INTRAVENOUS
  Administered 2022-06-23: 50 mg via INTRAVENOUS

## 2022-06-23 MED ORDER — LIDOCAINE HCL (CARDIAC) PF 100 MG/5ML IV SOSY
PREFILLED_SYRINGE | INTRAVENOUS | Status: DC | PRN
  Administered 2022-06-23: 50 mg via INTRAVENOUS

## 2022-06-23 NOTE — H&P (Signed)
[name], MD [address] [place], [place], [place], [address] [address], [place], [place], [place], [address] Phone: [phone]  Fax: [phone]  Primary Care Physician:  [name], NP   Pre-Procedure History & Physical: HPI:  Tonya [name] is a 76 y.o. female is here for an endoscopy    Past Medical History:  Diagnosis Date   (HFpEF) heart failure with preserved ejection fraction ([place])    a. 05/2017 Echo: EF 55-60%, Gr1 DD, mildly dil LA.   CKD (chronic kidney disease), stage III ([place])    Diabetes mellitus without complication ([place])    Hyperlipidemia    Hypertension    Stroke ([place])    a. 08/2017.   Wears dentures    partial upper    Past Surgical History:  Procedure Laterality Date   CATARACT EXTRACTION W/PHACO Left 02/05/2021   Procedure: CATARACT EXTRACTION PHACO AND INTRAOCULAR LENS PLACEMENT (IOC) LEFT DIABETIC 5.47 00:51.4;  Surgeon: Birder Robson, MD;  Location: [facility];  Service: Ophthalmology;  Laterality: Left;  Diabetic - insulin and oral meds   CATARACT EXTRACTION W/PHACO Right 03/19/2021   Procedure: CATARACT EXTRACTION PHACO AND INTRAOCULAR LENS PLACEMENT (IOC) RIGHT DIABETIC 6.09 00:39.6;  Surgeon: Birder Robson, MD;  Location: [facility];  Service: Ophthalmology;  Laterality: Right;   COLONOSCOPY  12/15/2006   COLONOSCOPY WITH PROPOFOL N/A 06/06/2019   Procedure: COLONOSCOPY WITH PROPOFOL;  Surgeon: [name], MD;  [facility] ENDOSCOPY;  Service: Gastroenterology;  Laterality: N/A;   COLONOSCOPY WITH PROPOFOL N/A 01/16/2020   Procedure: COLONOSCOPY WITH BIOPSY;  Surgeon: [name], MD;  Location: [facility];  Service: Endoscopy;  Laterality: N/A;  Diabetic - insulin and oral meds   EYE SURGERY     POLYPECTOMY N/A 01/16/2020   Procedure: POLYPECTOMY;  Surgeon: [name], MD;  Location: [facility];  Service: Endoscopy;  Laterality: N/A;   TUBAL LIGATION      Prior to  Admission medications   Medication Sig Start Date End Date Taking? Authorizing Provider  aspirin 81 MG tablet Take 81 mg by mouth daily.   Yes [provider]  carvedilol (COREG) 3.125 MG tablet TAKE 1 TABLET BY MOUTH TWICE  DAILY WITH A MEAL 04/03/22  Yes Gollan, Kathlene November, MD  docusate sodium (COLACE) 100 MG capsule Take 100 mg by mouth daily as needed for mild constipation.   Yes [provider]  ferrous sulfate 325 (65 FE) MG tablet Take 325 mg by mouth daily with breakfast.   Yes [provider]  HUMALOG KWIKPEN 100 UNIT/ML KiwkPen 6 Units. Take 6 units before breakfast, lunch, supper if glucose greater than 70 05/20/18  Yes [provider]  methocarbamol (ROBAXIN) 500 MG tablet Take 1 tablet by mouth daily.   Yes [provider]  NOVOFINE 32G X 6 MM MISC USE AS DIRECTED THREE TIMES DAILY WITH HUMALOG 06/19/20  Yes Cannady, [name] T, NP  olmesartan (BENICAR) 40 MG tablet Take 1 tablet (40 mg total) by mouth daily. 08/07/20  Yes Cannady, [name] T, NP  omeprazole (PRILOSEC) 40 MG capsule Take 1 capsule (40 mg total) by mouth in the morning and at bedtime. 06/09/22  Yes [name], MD  pioglitazone (ACTOS) 30 MG tablet Take 30 mg by mouth daily. 10/11/19  Yes [provider]  rosuvastatin (CRESTOR) 5 MG tablet TAKE 1 TABLET BY MOUTH DAILY 04/03/22  Yes Gollan, Kathlene November, MD  torsemide (DEMADEX) 20 MG tablet Take 2 tablets by  mouth daily.   Yes [provider]  vitamin B-12 (CYANOCOBALAMIN) 100 MCG tablet Take 100 mcg by mouth daily.   Yes [provider]  Vitamin D, Cholecalciferol, 50 MCG (2000 UT) CAPS Take 2,000 Units by mouth daily.   Yes [provider]  clopidogrel (PLAVIX) 75 MG tablet TAKE 1 TABLET BY MOUTH  DAILY 10/18/21   [name] T, NP    Allergies as of 06/10/2022 - Review Complete 06/09/2022  Allergen Reaction Noted   Atorvastatin  09/03/2017   Gabapentin Other (See Comments) 06/30/2018    Pravastatin  09/03/2017   Pregabalin  57/84/6962   Trulicity [dulaglutide] Hives 05/01/2015    Family History  Problem Relation Age of Onset   Diabetes Mother    Stroke Mother    Hyperlipidemia Father    Hypertension Father    Diabetes Sister    Diabetes Brother    Gout Son    Diabetes Brother    Kidney disease Son    Lymphoma Sister    Heart attack Sister    Breast cancer Neg Hx     Social History   Socioeconomic History   Marital status: Married    Spouse name: Not on file   Number of children: Not on file   Years of education: Not on file   Highest education level: GED or equivalent  Occupational History   Occupation: retired  Tobacco Use   Smoking status: Never   Smokeless tobacco: Never  Vaping Use   Vaping Use: Never used  Substance and Sexual Activity   Alcohol use: No    Alcohol/week: 0.0 standard drinks of alcohol   Drug use: No   Sexual activity: Yes  Other Topics Concern   Not on file  Social History Narrative   Not on file   Social Determinants of Health   Financial Resource Strain: Low Risk  (11/12/2021)   Overall Financial Resource Strain (CARDIA)    Difficulty of Paying Living Expenses: Not hard at all  Food Insecurity: No Food Insecurity (11/12/2021)   Hunger Vital Sign    Worried About Running Out of Food in the Last Year: Never true    [place] in the Last Year: Never true  Transportation Needs: No Transportation Needs (11/12/2021)   PRAPARE - Hydrologist (Medical): No    Lack of Transportation (Non-Medical): No  Physical Activity: Inactive (11/12/2021)   Exercise Vital Sign    Days of Exercise per Week: 0 days    Minutes of Exercise per Session: 0 min  Stress: No Stress Concern Present (11/12/2021)   [place]    Feeling of Stress : Not at all  Social Connections: [place] (11/12/2021)   Social Connection and Isolation  Panel [NHANES]    Frequency of Communication with Friends and Family: Twice a week    Frequency of Social Gatherings with Friends and Family: Twice a week    Attends Religious Services: More than 4 times per year    Active Member of [employer] or Organizations: Yes    Attends Archivist Meetings: 1 to 4 times per year    Marital Status: Married  Human resources officer Violence: Not At Risk (11/12/2021)   Humiliation, Afraid, Rape, and Kick questionnaire    Fear of Current or Ex-Partner: No    Emotionally Abused: No    Physically Abused: No    Sexually Abused: No    Review  of Systems: See HPI, otherwise negative ROS  Physical Exam: BP (!) 186/86   Pulse 79   Temp (!) 96.6 F (35.9 C) (Temporal)   Resp 16   Ht '5\' 7"'$  (1.702 m)   Wt 72.7 kg   LMP  (LMP Unknown)   SpO2 100%   BMI 25.11 kg/m  General:   Alert,  pleasant and cooperative in NAD Head:  Normocephalic and atraumatic. Neck:  Supple; no masses or thyromegaly. Lungs:  Clear throughout to auscultation, normal respiratory effort.    Heart:  +S1, +S2, Regular rate and rhythm, No edema. Abdomen:  Soft, nontender and nondistended. Normal bowel sounds, without guarding, and without rebound.   Neurologic:  Alert and  oriented x4;  grossly normal neurologically.  Impression/Plan: Tonya [name] is here for an endoscopy  to be performed for  evaluation of abnormal ct scan     Risks, benefits, limitations, and alternatives regarding endoscopy have been reviewed with the patient.  Questions have been answered.  All parties agreeable.   [name], MD  06/23/2022, 11:08 AM

## 2022-06-23 NOTE — Transfer of Care (Signed)
Immediate Anesthesia Transfer of Care Note  Patient: Tonya [name]  Procedure(s) Performed: ESOPHAGOGASTRODUODENOSCOPY (EGD) WITH PROPOFOL  Patient Location: PACU  Anesthesia Type:General  Level of Consciousness: drowsy  Airway & Oxygen Therapy: Patient Spontanous Breathing and Patient connected to face mask oxygen  Post-op Assessment: Report given to RN and Post -op Vital signs reviewed and stable  Post vital signs: Reviewed and stable  Last Vitals:  Vitals Value Taken Time  BP 131/75 06/23/22 1139  Temp    Pulse 77 06/23/22 1139  Resp 17 06/23/22 1139  SpO2 100 % 06/23/22 1139  Vitals shown include unvalidated device data.  Last Pain:  Vitals:   06/23/22 1026  TempSrc: Temporal  PainSc: 0-No pain         Complications: No notable events documented.

## 2022-06-23 NOTE — Anesthesia Preprocedure Evaluation (Signed)
Anesthesia Evaluation  Patient identified by MRN, date of birth, ID band Patient awake    Reviewed: Allergy & Precautions, NPO status , Patient's Chart, lab work & pertinent test results  History of Anesthesia Complications Negative for: history of anesthetic complications  Airway Mallampati: III  TM Distance: <3 FB Neck ROM: full    Dental  (+) Chipped, Missing   Pulmonary neg pulmonary ROS, neg shortness of breath,    Pulmonary exam normal        Cardiovascular Exercise Tolerance: Poor hypertension, (-) anginaNormal cardiovascular exam     Neuro/Psych CVA, Residual Symptoms negative psych ROS   GI/Hepatic negative GI ROS, Neg liver ROS, neg GERD  ,  Endo/Other  diabetes, Type 2  Renal/GU Renal disease  negative genitourinary   Musculoskeletal   Abdominal   Peds  Hematology negative hematology ROS (+)   Anesthesia Other Findings Past Medical History: No date: (HFpEF) heart failure with preserved ejection fraction ([place])     Comment:  a. 05/2017 Echo: EF 55-60%, Gr1 DD, mildly dil LA. No date: CKD (chronic kidney disease), stage III (HCC) No date: Diabetes mellitus without complication ([place]) No date: Hyperlipidemia No date: Hypertension No date: Stroke [facility])     Comment:  a. 08/2017. No date: Wears dentures     Comment:  partial upper  Past Surgical History: 02/05/2021: CATARACT EXTRACTION W/PHACO; Left     Comment:  Procedure: CATARACT EXTRACTION PHACO AND INTRAOCULAR               LENS PLACEMENT (IOC) LEFT DIABETIC 5.47 00:51.4;                Surgeon: Birder Robson, MD;  Location: [place];  Service: Ophthalmology;  Laterality: Left;                Diabetic - insulin and oral meds 03/19/2021: CATARACT EXTRACTION W/PHACO; Right     Comment:  Procedure: CATARACT EXTRACTION PHACO AND INTRAOCULAR               LENS PLACEMENT (IOC) RIGHT DIABETIC 6.09 00:39.6;                 Surgeon: Birder Robson, MD;  Location: [place];  Service: Ophthalmology;  Laterality: Right; 12/15/2006: COLONOSCOPY 06/06/2019: COLONOSCOPY WITH PROPOFOL; N/A     Comment:  Procedure: COLONOSCOPY WITH PROPOFOL;  Surgeon: [name], MD;  Location: [facility] ENDOSCOPY;  Service:               Gastroenterology;  Laterality: N/A; 01/16/2020: COLONOSCOPY WITH PROPOFOL; N/A     Comment:  Procedure: COLONOSCOPY WITH BIOPSY;  Surgeon: [name], MD;  Location: [place];  Service:               Endoscopy;  Laterality: N/A;  Diabetic - insulin and oral              meds No date: EYE SURGERY 01/16/2020: POLYPECTOMY; N/A     Comment:  Procedure: POLYPECTOMY;  Surgeon: [name], MD;                Location: [place];  Service: Endoscopy;  Laterality: N/A; No date: TUBAL LIGATION  BMI    Body Mass Index: 25.11 kg/m      Reproductive/Obstetrics negative OB ROS                             Anesthesia Physical Anesthesia Plan  ASA: 3  Anesthesia Plan: General   Post-op Pain Management:    Induction: Intravenous  PONV Risk Score and Plan: Propofol infusion and TIVA  Airway Management Planned: Natural Airway and Nasal Cannula  Additional Equipment:   Intra-op Plan:   Post-operative Plan:   Informed Consent: I have reviewed the patients History and Physical, chart, labs and discussed the procedure including the risks, benefits and alternatives for the proposed anesthesia with the patient or authorized representative who has indicated his/her understanding and acceptance.     Dental Advisory Given  Plan Discussed with: Anesthesiologist, CRNA and Surgeon  Anesthesia Plan Comments: (Patient consented for risks of anesthesia including but not limited to:  - adverse reactions to medications - risk of airway placement if required - damage to eyes, teeth, lips or  other oral mucosa - nerve damage due to positioning  - sore throat or hoarseness - Damage to heart, brain, nerves, lungs, other parts of body or loss of life  Patient voiced understanding.)        Anesthesia Quick Evaluation

## 2022-06-23 NOTE — Anesthesia Postprocedure Evaluation (Signed)
Anesthesia Post Note  Patient: Tonya [name]  Procedure(s) Performed: ESOPHAGOGASTRODUODENOSCOPY (EGD) WITH PROPOFOL  Patient location during evaluation: Endoscopy Anesthesia Type: General Level of consciousness: awake and alert Pain management: pain level controlled Vital Signs Assessment: post-procedure vital signs reviewed and stable Respiratory status: spontaneous breathing, nonlabored ventilation, respiratory function stable and patient connected to nasal cannula oxygen Cardiovascular status: blood pressure returned to baseline and stable Postop Assessment: no apparent nausea or vomiting Anesthetic complications: no   No notable events documented.   Last Vitals:  Vitals:   06/23/22 1149 06/23/22 1159  BP: (!) 150/84 (!) 165/70  Pulse: 73 74  Resp: 14 14  Temp:    SpO2: 98% 100%    Last Pain:  Vitals:   06/23/22 1159  TempSrc:   PainSc: 0-No pain                 Tonya [name]

## 2022-06-23 NOTE — Op Note (Signed)
[facility] Patient Name: Tonya [name] Procedure Date: 06/23/2022 11:16 AM MRN: [id number] Account #: [id number] Date of Birth: [birth year] Admit Type: Outpatient Age: 76 Room: [facility] ENDO ROOM 1 Gender: Female Note Status: Finalized Instrument Name: Upper Endoscope [id number] Procedure:             Upper GI endoscopy Indications:           Abnormal CT of the GI tract Providers:             [name] MD, MD Referring MD:          [name]. Ned Card (Referring MD) Medicines:             Monitored Anesthesia Care Complications:         No immediate complications. Procedure:             Pre-Anesthesia Assessment:                        - Prior to the procedure, a History and Physical was                         performed, and patient medications, allergies and                         sensitivities were reviewed. The patient's tolerance                         of previous anesthesia was reviewed.                        - The risks and benefits of the procedure and the                         sedation options and risks were discussed with the                         patient. All questions were answered and informed                         consent was obtained.                        - ASA Grade Assessment: II - A patient with mild                         systemic disease.                        After obtaining informed consent, the endoscope was                         passed under direct vision. Throughout the procedure,                         the patient's blood pressure, pulse, and oxygen                         saturations were monitored continuously. The Endoscope  was introduced through the mouth, and advanced to the                         third part of duodenum. The upper GI endoscopy was                         accomplished with ease. The patient tolerated the                         procedure well. Findings:      A large  hiatal hernia was present.      The esophagus was normal.      The examined duodenum was normal.      The cardia and gastric fundus were normal on retroflexion. Impression:            - Large hiatal hernia.                        - Normal esophagus.                        - Normal examined duodenum.                        - No specimens collected. Recommendation:        - Discharge patient to home (with escort).                        - Resume previous diet.                        - Continue present medications.                        - Return to my office as previously scheduled. Procedure Code(s):     --- Professional ---                        [phone], Esophagogastroduodenoscopy, flexible,                         transoral; diagnostic, including collection of                         specimen(s) by brushing or washing, when performed                         (separate procedure) Diagnosis Code(s):     --- Professional ---                        K44.9, Diaphragmatic hernia without obstruction or                         gangrene                        R93.3, Abnormal findings on diagnostic imaging of                         other parts of digestive tract CPT copyright 2019 American Medical Association. All rights reserved. The codes documented in this report are  preliminary and upon coder review may  be revised to meet current compliance requirements. [name], MD [name] MD, MD 06/23/2022 11:33:11 AM This report has been signed electronically. Number of Addenda: 0 Note Initiated On: 06/23/2022 11:16 AM Estimated Blood Loss:  Estimated blood loss: none.      [facility]

## 2022-06-24 ENCOUNTER — Encounter: Payer: Self-pay | Admitting: Gastroenterology

## 2022-06-26 ENCOUNTER — Other Ambulatory Visit: Payer: Self-pay | Admitting: Cardiovascular Disease

## 2022-06-26 ENCOUNTER — Other Ambulatory Visit: Payer: Self-pay | Admitting: Nurse Practitioner

## 2022-06-26 NOTE — Telephone Encounter (Signed)
No longer current dosing on this medication Requested Prescriptions  Pending Prescriptions Disp Refills  . omeprazole (PRILOSEC) 20 MG capsule [Pharmacy Med Name: Omeprazole 20 MG Oral Capsule Delayed Release] 180 capsule 3    Sig: TAKE 1 CAPSULE BY MOUTH TWICE  DAILY     Gastroenterology: Proton Pump Inhibitors Passed - 06/26/2022  2:05 AM      Passed - Valid encounter within last 12 months    Recent Outpatient Visits          4 months ago Poorly controlled type 2 diabetes mellitus with neuropathy ([place])   [place], [name] T, NP   8 months ago Poorly controlled type 2 diabetes mellitus with neuropathy ([place])   [place], [name] T, NP   11 months ago Poorly controlled type 2 diabetes mellitus with neuropathy ([place])   [place], [name] T, NP   1 year ago Poorly controlled type 2 diabetes mellitus with neuropathy ([place])   [place], [name] T, NP   1 year ago Poorly controlled type 2 diabetes mellitus with neuropathy ([place])   [place], [name], NP      Future Appointments            In 5 days [place], Kathlene November, MD [place], [place]   In 1 month [place], [name], NP [employer], [place]   In 2 months [name], [place]

## 2022-06-27 ENCOUNTER — Ambulatory Visit: Payer: Medicare Other | Admitting: Pulmonary Disease

## 2022-06-27 ENCOUNTER — Encounter: Payer: Self-pay | Admitting: Pulmonary Disease

## 2022-06-27 VITALS — BP 130/80 | HR 94 | Temp 98.2°F | Ht 67.0 in | Wt 162.0 lb

## 2022-06-27 DIAGNOSIS — K219 Gastro-esophageal reflux disease without esophagitis: Secondary | ICD-10-CM

## 2022-06-27 DIAGNOSIS — Z862 Personal history of diseases of the blood and blood-forming organs and certain disorders involving the immune mechanism: Secondary | ICD-10-CM | POA: Diagnosis not present

## 2022-06-27 DIAGNOSIS — K449 Diaphragmatic hernia without obstruction or gangrene: Secondary | ICD-10-CM

## 2022-06-27 NOTE — Patient Instructions (Signed)
We are referring you to Dr. Dahlia Byes on a surgeon who specializes on repairs of hiatal hernia so that he can give you an opinion on what are the options for repair of your hernia.   I will see you in follow-up in 6 months time call sooner should any new problems arise.

## 2022-06-27 NOTE — Progress Notes (Signed)
Subjective:    Patient ID: Tonya [name], female    DOB: [birth year], 76 y.o.   MRN: [id number] Patient Care Team: [name], NP as PCP - General (Nurse Practitioner) [name], MD as PCP - Cardiology (Cardiology) [place], Betsey Holiday, MD as Physician Assistant (Endocrinology) Lavonia Dana, MD as Consulting Physician (Nephrology) [name], MD as Consulting Physician (Cardiology) [place], [place] (Optometry) Earlie Server, MD as Consulting Physician (Oncology) Vanita Ingles, RN as Case Manager (General Practice)  Chief Complaint  Patient presents with   Follow-up    No current sx.    HPI Tonya [name] is a 76 year old smoker with a prior history of sarcoidosis who presents for follow-up from her initial visit of 28 March 2022.  At that time she was referred by the speech therapist because of prior history of sarcoidosis and the issues that the patient had with swallowing for which the speech pathologist was evaluating her.  The question was whether this was related to her prior sarcoidosis.  The patient had a chest x-ray performed on the day of the visit and this showed low lung volumes with chronic changes without definitive cardiopulmonary disease however it did reveal a very large hiatal hernia.  This was followed by a chest CT without contrast on 25 April.  The CT scan of the chest was notable for no evidence of sarcoidosis within the lungs.  No mediastinal lymphadenopathy.  The patient did have a very large hiatal hernia and there was still food material within the esophagus to the level of the thoracic inlet.  There was concern for obstructive esophageal lesion endoscopy was recommended.  The patient was referred to GI due to these findings.  The patient presents today for follow-up.  He had upper endoscopy on 10 July by Dr. Vicente Males.  She has not  been referred to surgery for consideration of paraesophageal hernia repair.  She does not endorse any fevers, chills or sweats.   No shortness of breath.  No hemoptysis nor hematemesis.  Continues to have some swallowing difficulties but as noted this is being evaluated thoroughly.  We have reassured her that she does not have recurrent sarcoidosis and this relieved her.  Her visit we ordered pulmonary function testing however these have not been performed as of yet.   Review of Systems A 10 point review of systems was performed and it is as noted above otherwise negative.  Patient Active Problem List   Diagnosis Date Noted   Acute upper GI bleeding 12/05/2022   S/P repair of paraesophageal hernia 08/26/2022   Hiatal hernia 07/26/2022   Anemia in stage 4 chronic kidney disease ([place]) 04/15/2022   H/O sarcoidosis 01/29/2022   Ganglion cyst of wrist, left 01/29/2022   Lumbar degenerative disc disease 06/07/2020   Lumbar spondylosis 06/07/2020   Chronic pain syndrome 04/18/2020   Sacroiliac joint pain 04/18/2020   Chronic heart failure with preserved ejection fraction (HFpEF) ([place]) 01/11/2020   Grade I diastolic dysfunction 70/35/0093   History of CVA (cerebrovascular accident) 12/01/2019   Hyperparathyroidism due to renal insufficiency ([place]) 08/31/2019   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[place]) 09/22/2017   Advanced care planning/counseling discussion 04/13/2017   Post herpetic neuralgia 10/10/2016   Hip bursitis 09/18/2015   Poorly controlled type 2 diabetes mellitus with neuropathy ([place]) 05/01/2015   Hyperlipidemia associated with type 2 diabetes mellitus ([place]) 05/01/2015   Chronic kidney disease, stage 4, severely decreased GFR ([place]) 05/01/2015  Encounter for long-term (current) use of insulin ([place]) 05/01/2015   Social History   Tobacco Use   Smoking status: Never   Smokeless tobacco: Never  Substance Use Topics   Alcohol use: No    Alcohol/week: 0.0 standard drinks of alcohol   Allergies  Allergen Reactions   Atorvastatin     Myalgias    Gabapentin Other (See Comments)     Speech impairment    Pravastatin     Myalgias    Pregabalin     Speech impairment   Trulicity [Dulaglutide] Hives   Current medications were reviewed with the patient as noted.  Immunization History  Administered Date(s) Administered   Fluad Quad(high Dose 65+) 09/30/2019, 01/29/2021, 10/29/2021, 12/22/2022   Influenza, High Dose Seasonal PF 09/10/2016, 11/16/2018   Influenza,inj,Quad PF,6+ Mos 09/18/2015   Influenza-Unspecified 09/18/2015, 09/08/2017   Moderna Sars-Covid-2 Vaccination 02/15/2020, 03/07/2020, 09/14/2020   PPD Test 07/15/2016, 07/29/2016   Pneumococcal Conjugate-13 06/07/2014   Pneumococcal Polysaccharide-23 09/07/2013   Td 06/12/2005   Td (Adult), 2 Lf Tetanus Toxid, Preservative Free 06/12/2005   Tdap 10/10/2016   Zoster, Live 09/13/2013      Objective:   Physical Exam BP 130/80 (BP Location: Left Arm, Cuff Size: Normal)   Pulse 94   Temp 98.2 F (36.8 C) (Temporal)   Ht '5\' 7"'$  (1.702 m)   Wt 162 lb (73.5 kg)   LMP  (LMP Unknown)   SpO2 98%   BMI 25.37 kg/m ' GENERAL: Well-developed, well-nourished woman, no acute distress.  Fully ambulatory.  No conversational dyspnea. HEAD: Normocephalic, atraumatic.  EYES: Pupils equal, round, reactive to light.  No scleral icterus.  MOUTH: Oral mucosa moist.  No thrush NECK: Supple. No thyromegaly. Trachea midline. No JVD.  No adenopathy. PULMONARY: Good air entry bilaterally.  No adventitious sounds.  CARDIOVASCULAR: S1 and S2. Regular rate and rhythm.  No rubs, murmurs or gallops heard. ABDOMEN: Benign. MUSCULOSKELETAL: No joint deformity, no clubbing, no edema.  NEUROLOGIC: No overt focal deficit, no gait disturbance, speech is fluent. SKIN: Intact,warm,dry. PSYCH: Mood and behavior normal.  Representative image of the CT performed 08 April 2022 showing a large hiatal hernia, paraesophageal, with intrathoracic stomach (arrow):       Assessment & Plan:     ICD-10-CM   1. Hiatal hernia with GERD  K44.9  Ambulatory referral to General Surgery   K21.9 CANCELED: Ambulatory referral to General Surgery   Referral to Dr. Dahlia Byes for consideration of surgical repair    2. Personal history of sarcoidosis  Z86.2    No evidence of active disease     Orders Placed This Encounter  Procedures   Ambulatory referral to General Surgery    Referral Priority:   Routine    Referral Type:   Surgical    Referral Reason:   Specialty Services Required    Referred to Provider:   [name], MD    Requested Specialty:   General Surgery    Number of Visits Requested:   1   Will see the patient in follow-up in 6 months time she is to call sooner should any new problems arise.  Renold Don, MD Advanced Bronchoscopy PCCM [facility]-    *This note was dictated using voice recognition software/Dragon.  Despite best efforts to proofread, errors can occur which can change the meaning. Any transcriptional errors that result from this process are unintentional and may not be fully corrected at the time of dictation.

## 2022-06-30 NOTE — Progress Notes (Unsigned)
Cardiology Office Note  Date:  07/01/2022   ID:  Tonya [name], DOB 1946-04-21, MRN [id number]  PCP:  [name], NP   Chief Complaint  Patient presents with   12 month follow up    "Doing well." Patient c/o LE edema at times. Medications reviewed by the patient verbally.     HPI:  76 yo woman with past medical hx of  DM, HBA1C 13 down to 7 to 8 Chronic Leg edema HTN Hyperlipidemia Chronic kidney disease, followed by nephrology Presenting for f/u of her cardiomyopathy, leg swelling''  Last seen by myself 5/22 Reports sedentary, sits in her favorite chair watches TV Walks with a cane, no falls No regular exercise program Denies any shortness of breath, no chest pain, no tachypalpitations concerning for arrhythmia Minimal leg edema Limited by back pain, followed by pain clinic  Lab work reviewed A1c 8.5 Total cholesterol 176 LDL 76 Creatinine 2.4 BUN 40  Echocardiogram December 2020  ejection fraction 60 to 65%  CT chest, images pulled up and reviewed Large hiatal hernia. Minimal coronary calcium, minimal aortic athero  EKG personally reviewed by myself on todays visit Shows normal sinus rhythm rate 77 with  T wave abnormality 1 and aVL,   Last seen in clinic by one of our providers January 2021 On that visit reported blood pressure 176 and 160 systolic, had little bit of swelling at the end of the day thought it was a side effect from the Lyrica Was on Crestor, Lasix, carvedilol low-dose with Plavix and aspirin  Echocardiogram December 2020 normal ejection fraction  PMH:   has a past medical history of (HFpEF) heart failure with preserved ejection fraction ([place]), CKD (chronic kidney disease), stage III ([place]), Diabetes mellitus without complication ([place]), Hyperlipidemia, Hypertension, Stroke ([place]), and Wears dentures.  PSH:    Past Surgical History:  Procedure Laterality Date   CATARACT EXTRACTION W/PHACO Left 02/05/2021   Procedure: CATARACT EXTRACTION  PHACO AND INTRAOCULAR LENS PLACEMENT (IOC) LEFT DIABETIC 5.47 00:51.4;  Surgeon: Birder Robson, MD;  Location: [place];  Service: Ophthalmology;  Laterality: Left;  Diabetic - insulin and oral meds   CATARACT EXTRACTION W/PHACO Right 03/19/2021   Procedure: CATARACT EXTRACTION PHACO AND INTRAOCULAR LENS PLACEMENT (IOC) RIGHT DIABETIC 6.09 00:39.6;  Surgeon: Birder Robson, MD;  Location: [place];  Service: Ophthalmology;  Laterality: Right;   COLONOSCOPY  12/15/2006   COLONOSCOPY WITH PROPOFOL N/A 06/06/2019   Procedure: COLONOSCOPY WITH PROPOFOL;  Surgeon: [name], MD;  Location[facility] ENDOSCOPY;  Service: Gastroenterology;  Laterality: N/A;   COLONOSCOPY WITH PROPOFOL N/A 01/16/2020   Procedure: COLONOSCOPY WITH BIOPSY;  Surgeon: [name], MD;  Location: [place];  Service: Endoscopy;  Laterality: N/A;  Diabetic - insulin and oral meds   ESOPHAGOGASTRODUODENOSCOPY (EGD) WITH PROPOFOL N/A 06/23/2022   Procedure: ESOPHAGOGASTRODUODENOSCOPY (EGD) WITH PROPOFOL;  Surgeon: [name], MD;  Location: [facility] ENDOSCOPY;  Service: Gastroenterology;  Laterality: N/A;   EYE SURGERY     POLYPECTOMY N/A 01/16/2020   Procedure: POLYPECTOMY;  Surgeon: [name], MD;  Location: [place];  Service: Endoscopy;  Laterality: N/A;   TUBAL LIGATION      Current Outpatient Medications  Medication Sig Dispense Refill   aspirin 81 MG tablet Take 81 mg by mouth daily.     carvedilol (COREG) 3.125 MG tablet Take 1 tablet (3.125 mg total) by mouth 2 (two) times daily with a meal. Please keep upcoming appointment in July 2023 for future refills. Thank you  60 tablet 0   clopidogrel (PLAVIX) 75 MG tablet TAKE 1 TABLET BY MOUTH  DAILY 90 tablet 4   docusate sodium (COLACE) 100 MG capsule Take 100 mg by mouth daily as needed for mild constipation.     ferrous sulfate 325 (65 FE) MG tablet Take 325 mg by mouth daily with breakfast.     HUMALOG KWIKPEN 100 UNIT/ML  KiwkPen 6 Units. Take 6 units before breakfast, lunch, supper if glucose greater than 70  11   methocarbamol (ROBAXIN) 500 MG tablet Take 1 tablet by mouth daily.     NOVOFINE 32G X 6 MM MISC USE AS DIRECTED THREE TIMES DAILY WITH HUMALOG 100 each 3   olmesartan (BENICAR) 40 MG tablet Take 1 tablet (40 mg total) by mouth daily. 90 tablet 4   omeprazole (PRILOSEC) 40 MG capsule Take 1 capsule (40 mg total) by mouth in the morning and at bedtime. 180 capsule 0   pioglitazone (ACTOS) 30 MG tablet Take 30 mg by mouth daily.     rosuvastatin (CRESTOR) 5 MG tablet Take 1 tablet (5 mg total) by mouth daily. Please keep schedule appointment in July 2023 for future refills 30 tablet 0   torsemide (DEMADEX) 20 MG tablet Take 2 tablets by mouth daily.     vitamin B-12 (CYANOCOBALAMIN) 100 MCG tablet Take 100 mcg by mouth daily.     Vitamin D, Cholecalciferol, 50 MCG (2000 UT) CAPS Take 2,000 Units by mouth daily.     No current facility-administered medications for this visit.    Allergies:   Atorvastatin, Gabapentin, Pravastatin, Pregabalin, and Trulicity [dulaglutide]   Social History:  The patient  reports that she has never smoked. She has never used smokeless tobacco. She reports that she does not drink alcohol and does not use drugs.   Family History:   family history includes Diabetes in her brother, brother, mother, and sister; Gout in her son; Heart attack in her sister; Hyperlipidemia in her father; Hypertension in her father; Kidney disease in her son; Lymphoma in her sister; Stroke in her mother.    Review of Systems: Review of Systems  Constitutional: Negative.   Respiratory: Negative.    Cardiovascular: Negative.   Gastrointestinal: Negative.   Musculoskeletal:  Positive for back pain.  Neurological: Negative.   Psychiatric/Behavioral: Negative.    All other systems reviewed and are negative.   PHYSICAL EXAM: VS:  BP 138/70 (BP Location: Left Arm, Patient Position: Sitting, Cuff  Size: Normal)   Pulse 77   Ht '5\' 7"'$  (1.702 m)   Wt 166 lb (75.3 kg)   LMP  (LMP Unknown)   SpO2 98%   BMI 26.00 kg/m  , BMI Body mass index is 26 kg/m. Constitutional:  oriented to person, place, and time. No distress.  HENT:  Head: Grossly normal Eyes:  no discharge. No scleral icterus.  Neck: No JVD, no carotid bruits  Cardiovascular: Regular rate and rhythm, no murmurs appreciated Pulmonary/Chest: Clear to auscultation bilaterally, no wheezes or rails Abdominal: Soft.  no distension.  no tenderness.  Musculoskeletal: Normal range of motion Neurological:  normal muscle tone. Coordination normal. No atrophy Skin: Skin warm and dry Psychiatric: normal affect, pleasant   Recent Labs: 04/15/2022: ALT 12; BUN 39; Creatinine, Ser 2.26; Hemoglobin 8.5; Platelets 280; Potassium 4.6; Sodium 140    Lipid Panel Lab Results  Component Value Date   CHOL 176 01/29/2022   HDL 85 01/29/2022   LDLCALC 76 01/29/2022   TRIG 84 01/29/2022  Wt Readings from Last 3 Encounters:  07/01/22 166 lb (75.3 kg)  06/27/22 162 lb (73.5 kg)  06/23/22 160 lb 5.1 oz (72.7 kg)     ASSESSMENT AND PLAN:  Fatigue Long discussion concerning need for exercise program to maintain leg /gait stability, strength  Shortness of breath Sedentary, recommended regular walking program normal ejection fraction by echocardiogram 2000, 2021 No change to clinical appearance on today's visit    Poorly controlled type 2 diabetes mellitus with neuropathy ([place]) Discussed diet with her, medication compliance Already with complicating kidney disease,  Stressed the importance of aggressive diabetes control  Chronic kidney disease, stage 4, severely decreased GFR (HCC)  kidney disease likely secondary to long-standing diabetes, hypertension  Creatinine greater than 2 Followed by nephrology, on torsemide  Mixed hyperlipidemia  Continue Crestor Minimal coronary or aortic plaque noted on CT scan images reviewed  personally by myself  Hypertension On  ARB, per nephrology Avoid calcium channel blockers given history of leg swelling   Total encounter time more than 30 minutes  Greater than 50% was spent in counseling and coordination of care with the patient     Orders Placed This Encounter  Procedures   EKG 12-Lead     Signed, Esmond Plants, M.D., Ph.D. 07/01/2022  [place], [place]

## 2022-07-01 ENCOUNTER — Ambulatory Visit: Payer: Medicare Other | Admitting: Cardiovascular Disease

## 2022-07-01 ENCOUNTER — Encounter: Payer: Self-pay | Admitting: Cardiovascular Disease

## 2022-07-01 VITALS — BP 138/70 | HR 77 | Ht 67.0 in | Wt 166.0 lb

## 2022-07-01 DIAGNOSIS — E785 Hyperlipidemia, unspecified: Secondary | ICD-10-CM | POA: Diagnosis not present

## 2022-07-01 DIAGNOSIS — N184 Chronic kidney disease, stage 4 (severe): Secondary | ICD-10-CM | POA: Diagnosis not present

## 2022-07-01 DIAGNOSIS — I5032 Chronic diastolic (congestive) heart failure: Secondary | ICD-10-CM

## 2022-07-01 DIAGNOSIS — E1140 Type 2 diabetes mellitus with diabetic neuropathy, unspecified: Secondary | ICD-10-CM

## 2022-07-01 DIAGNOSIS — I130 Hypertensive heart and chronic kidney disease with heart failure and stage 1 through stage 4 chronic kidney disease, or unspecified chronic kidney disease: Secondary | ICD-10-CM

## 2022-07-01 DIAGNOSIS — E1165 Type 2 diabetes mellitus with hyperglycemia: Secondary | ICD-10-CM

## 2022-07-01 DIAGNOSIS — E1169 Type 2 diabetes mellitus with other specified complication: Secondary | ICD-10-CM

## 2022-07-01 MED ORDER — TORSEMIDE 20 MG PO TABS: 40.0000 mg | ORAL_TABLET | Freq: Every day | ORAL | 3 refills | Status: DC

## 2022-07-01 MED ORDER — ROSUVASTATIN CALCIUM 5 MG PO TABS: 5.0000 mg | ORAL_TABLET | Freq: Every day | ORAL | 3 refills | Status: DC

## 2022-07-01 MED ORDER — CARVEDILOL 3.125 MG PO TABS: 3.1250 mg | ORAL_TABLET | Freq: Two times a day (BID) | ORAL | 3 refills | Status: DC

## 2022-07-01 NOTE — Patient Instructions (Signed)
Medication Instructions:  No changes  If you need a refill on your cardiac medications before your next appointment, please call your pharmacy.   Lab work: No new labs needed  Testing/Procedures: No new testing needed  Follow-Up: At [facility], you and your health needs are our priority.  As part of our continuing mission to provide you with exceptional heart care, we have created designated Provider Care Teams.  These Care Teams include your primary Cardiologist (physician) and Advanced Practice Providers (APPs -  Physician Assistants and Nurse Practitioners) who all work together to provide you with the care you need, when you need it.  You will need a follow up appointment in 12 months  Providers on your designated Care Team:   Christopher Berge, NP Ryan Dunn, PA-C Cadence Furth, PA-C  COVID-19 Vaccine Information can be found at: [email or web address] For questions related to vaccine distribution or appointments, please email [email or web address] or call [phone].   

## 2022-07-02 ENCOUNTER — Encounter: Payer: Self-pay | Admitting: Oncology

## 2022-07-07 ENCOUNTER — Encounter: Payer: Self-pay | Admitting: Oncology

## 2022-07-07 ENCOUNTER — Other Ambulatory Visit: Payer: Self-pay | Admitting: Nurse Practitioner

## 2022-07-09 ENCOUNTER — Other Ambulatory Visit: Payer: Self-pay

## 2022-07-09 ENCOUNTER — Telehealth: Payer: Self-pay

## 2022-07-09 ENCOUNTER — Encounter: Payer: Self-pay | Admitting: Surgery

## 2022-07-09 ENCOUNTER — Ambulatory Visit: Payer: Medicare Other | Admitting: Surgery

## 2022-07-09 ENCOUNTER — Telehealth: Payer: Self-pay | Admitting: Cardiovascular Disease

## 2022-07-09 VITALS — BP 151/89 | HR 96 | Temp 98.1°F | Ht 67.0 in | Wt 165.8 lb

## 2022-07-09 DIAGNOSIS — K449 Diaphragmatic hernia without obstruction or gangrene: Secondary | ICD-10-CM

## 2022-07-09 DIAGNOSIS — K219 Gastro-esophageal reflux disease without esophagitis: Secondary | ICD-10-CM

## 2022-07-09 DIAGNOSIS — R1310 Dysphagia, unspecified: Secondary | ICD-10-CM

## 2022-07-09 NOTE — Telephone Encounter (Signed)
Faxed Cardiac Clearance to Dr. Ida Rogue at [phone].

## 2022-07-09 NOTE — Patient Instructions (Addendum)
Your CT is scheduled for 07/11/2022 '@3'$  pm (arrive by 2:45 pm) at [facility] [address]. Nothing to eat or drink 4 hours prior. Please pick up contrast between now and the day before.    Your Barium Swallow is scheduled for 07/18/2022 @ 11 am (arrive by 10:45 am) at [facility]. Nothing to eat or drink 3 hours prior.    If you have any concerns or questions, please feel free to call our office. See follow up appointment below.   Hiatal Hernia  A hiatal hernia occurs when part of the stomach slides above the muscle that separates the abdomen from the chest (diaphragm). A person can be born with a hiatal hernia (congenital), or it may develop over time. In almost all cases of hiatal hernia, only the top part of the stomach pushes through the diaphragm. Many people have a hiatal hernia with no symptoms. The larger the hernia, the more likely it is that you will have symptoms. In some cases, a hiatal hernia allows stomach acid to flow back into the tube that carries food from your mouth to your stomach (esophagus). This may cause heartburn symptoms. Severe heartburn symptoms may mean that you have developed a condition called gastroesophageal reflux disease (GERD). What are the causes? This condition is caused by a weakness in the opening (hiatus) where the esophagus passes through the diaphragm to attach to the upper part of the stomach. A person may be born with a weakness in the hiatus, or a weakness can develop over time. What increases the risk? This condition is more likely to develop in: Older people. Age is a major risk factor for a hiatal hernia, especially if you are over the age of 47. Pregnant women. People who are overweight. People who have frequent constipation. What are the signs or symptoms? Symptoms of this condition usually develop in the form of GERD symptoms. Symptoms include: Heartburn. Belching. Indigestion. Trouble swallowing. Coughing or wheezing. Sore  throat. Hoarseness. Chest pain. Nausea and vomiting. How is this diagnosed? This condition may be diagnosed during testing for GERD. Tests that may be done include: X-rays of your stomach or chest. An upper gastrointestinal (GI) series. This is an X-ray exam of your GI tract that is taken after you swallow a chalky liquid that shows up clearly on the X-ray. Endoscopy. This is a procedure to look into your stomach using a thin, flexible tube that has a tiny camera and light on the end of it. How is this treated? This condition may be treated by: Dietary and lifestyle changes to help reduce GERD symptoms. Medicines. These may include: Over-the-counter antacids. Medicines that make your stomach empty more quickly. Medicines that block the production of stomach acid (H2 blockers). Stronger medicines to reduce stomach acid (proton pump inhibitors). Surgery to repair the hernia, if other treatments are not helping. If you have no symptoms, you may not need treatment. Follow these instructions at home: Lifestyle and activity Do not use any products that contain nicotine or tobacco, such as cigarettes and e-cigarettes. If you need help quitting, ask your health care provider. Try to achieve and maintain a healthy body weight. Avoid putting pressure on your abdomen. Anything that puts pressure on your abdomen increases the amount of acid that may be pushed up into your esophagus. Avoid bending over, especially after eating. Raise the head of your bed by putting blocks under the legs. This keeps your head and esophagus higher than your stomach. Do not wear tight  clothing around your chest or stomach. Try not to strain when having a bowel movement, when urinating, or when lifting heavy objects. Eating and drinking Avoid foods that can worsen GERD symptoms. These may include: Fatty foods, like fried foods. Citrus fruits, like oranges or lemon. Other foods and drinks that contain acid, like orange  juice or tomatoes. Spicy food. Chocolate. Eat frequent small meals instead of three large meals a day. This helps prevent your stomach from getting too full. Eat slowly. Do not lie down right after eating. Do not eat 1-2 hours before bed. Do not drink beverages with caffeine. These include cola, coffee, cocoa, and tea. Do not drink alcohol. General instructions Take over-the-counter and prescription medicines only as told by your health care provider. Keep all follow-up visits as told by your health care provider. This is important. Contact a health care provider if: Your symptoms are not controlled with medicines or lifestyle changes. You are having trouble swallowing. You have coughing or wheezing that will not go away. Get help right away if: Your pain is getting worse. Your pain spreads to your arms, neck, jaw, teeth, or back. You have shortness of breath. You sweat for no reason. You feel sick to your stomach (nauseous) or you vomit. You vomit blood. You have bright red blood in your stools. You have black, tarry stools. Summary A hiatal hernia occurs when part of the stomach slides above the muscle that separates the abdomen from the chest (diaphragm). A person may be born with a weakness in the hiatus, or a weakness can develop over time. Symptoms of hiatal hernia may include heartburn, trouble swallowing, or sore throat. Management of hiatal hernia includes eating frequent small meals instead of three large meals a day. Get help right away if you vomit blood, have bright red blood in your stools, or have black, tarry stools. This information is not intended to replace advice given to you by your health care provider. Make sure you discuss any questions you have with your health care provider. Document Revised: 10/15/2021 Document Reviewed: 11/01/2020 Elsevier Patient Education  [place].

## 2022-07-09 NOTE — Progress Notes (Signed)
Patient ID: Tonya [name], female   DOB: [birth year], 76 y.o.   MRN: [id number]  HPI Tonya [name] is a 76 y.o. female seen in consultation at the request of Dr. Patsey Berthold for a paraesophageal hernia.  She does have a history of some chronic pulmonary issues.  She does have a history of chronic kidney disease a stage III, diabetes, hypertension and a prior stroke 5 years ago or so.  She has a preserved ejection fraction.  She also reports significant reflux that is only partially controlled with PPI.  More recently having cough and then having some issues with her lungs. She already had an EGD by Dr. Vicente Males that I have personally reviewed showing evidence of a large paraesophageal hernia.  There was no evidence of a strictures. She also had a CT scan that I have personally reviewed showing evidence of paraesophageal hernia.  There is about a half of the stomach within the mediastinum She is currently on Plavix and aspirin for stroke She walks w a cane. Lab work reviewed A1c 8.5 Hb 8.5, PL 280k Total cholesterol 176 LDL 76 Creatinine 2.4 BUN 40   Echocardiogram December 2020  ejection fraction 60 to 65%   HPI  Past Medical History:  Diagnosis Date   (HFpEF) heart failure with preserved ejection fraction ([place])    a. 05/2017 Echo: EF 55-60%, Gr1 DD, mildly dil LA.   CKD (chronic kidney disease), stage III ([place])    Diabetes mellitus without complication ([place])    Hyperlipidemia    Hypertension    Stroke ([place])    a. 08/2017.   Wears dentures    partial upper    Past Surgical History:  Procedure Laterality Date   CATARACT EXTRACTION W/PHACO Left 02/05/2021   Procedure: CATARACT EXTRACTION PHACO AND INTRAOCULAR LENS PLACEMENT (IOC) LEFT DIABETIC 5.47 00:51.4;  Surgeon: Birder Robson, MD;  Location: [place];  Service: Ophthalmology;  Laterality: Left;  Diabetic - insulin and oral meds   CATARACT EXTRACTION W/PHACO Right 03/19/2021   Procedure: CATARACT EXTRACTION PHACO AND  INTRAOCULAR LENS PLACEMENT (IOC) RIGHT DIABETIC 6.09 00:39.6;  Surgeon: Birder Robson, MD;  Location: [place];  Service: Ophthalmology;  Laterality: Right;   COLONOSCOPY  12/15/2006   COLONOSCOPY WITH PROPOFOL N/A 06/06/2019   Procedure: COLONOSCOPY WITH PROPOFOL;  Surgeon: [name], MD;  Location: [facility] ENDOSCOPY;  Service: Gastroenterology;  Laterality: N/A;   COLONOSCOPY WITH PROPOFOL N/A 01/16/2020   Procedure: COLONOSCOPY WITH BIOPSY;  Surgeon: [name], MD;  Location: [place];  Service: Endoscopy;  Laterality: N/A;  Diabetic - insulin and oral meds   ESOPHAGOGASTRODUODENOSCOPY (EGD) WITH PROPOFOL N/A 06/23/2022   Procedure: ESOPHAGOGASTRODUODENOSCOPY (EGD) WITH PROPOFOL;  Surgeon: [name], MD;  Location: [facility] ENDOSCOPY;  Service: Gastroenterology;  Laterality: N/A;   EYE SURGERY     POLYPECTOMY N/A 01/16/2020   Procedure: POLYPECTOMY;  Surgeon: [name], MD;  Location: [place];  Service: Endoscopy;  Laterality: N/A;   TUBAL LIGATION      Family History  Problem Relation Age of Onset   Diabetes Mother    Stroke Mother    Hyperlipidemia Father    Hypertension Father    Diabetes Sister    Diabetes Brother    Gout Son    Diabetes Brother    Kidney disease Son    Lymphoma Sister    Heart attack Sister    Breast cancer Neg Hx     Social History Social History   Tobacco Use  Smoking status: Never   Smokeless tobacco: Never  Vaping Use   Vaping Use: Never used  Substance Use Topics   Alcohol use: No    Alcohol/week: 0.0 standard drinks of alcohol   Drug use: No    Allergies  Allergen Reactions   Atorvastatin     Myalgias    Gabapentin Other (See Comments)    Speech impairment    Pravastatin     Myalgias    Pregabalin     Speech impairment   Trulicity [Dulaglutide] Hives    Current Outpatient Medications  Medication Sig Dispense Refill   aspirin 81 MG tablet Take 81 mg by mouth daily.     carvedilol (COREG)  3.125 MG tablet Take 1 tablet (3.125 mg total) by mouth 2 (two) times daily with a meal. 180 tablet 3   clopidogrel (PLAVIX) 75 MG tablet TAKE 1 TABLET BY MOUTH  DAILY 90 tablet 4   docusate sodium (COLACE) 100 MG capsule Take 100 mg by mouth daily as needed for mild constipation.     ferrous sulfate 325 (65 FE) MG tablet Take 325 mg by mouth daily with breakfast.     HUMALOG KWIKPEN 100 UNIT/ML KiwkPen 6 Units. Take 6 units before breakfast, lunch, supper if glucose greater than 70  11   methocarbamol (ROBAXIN) 500 MG tablet Take 1 tablet by mouth daily.     NOVOFINE 32G X 6 MM MISC USE AS DIRECTED THREE TIMES DAILY WITH HUMALOG 100 each 3   olmesartan (BENICAR) 40 MG tablet Take 1 tablet (40 mg total) by mouth daily. 90 tablet 4   omeprazole (PRILOSEC) 40 MG capsule Take 1 capsule (40 mg total) by mouth in the morning and at bedtime. 180 capsule 0   pioglitazone (ACTOS) 30 MG tablet Take 30 mg by mouth daily.     rosuvastatin (CRESTOR) 5 MG tablet Take 1 tablet (5 mg total) by mouth daily. 90 tablet 3   torsemide (DEMADEX) 20 MG tablet Take 2 tablets (40 mg total) by mouth daily. 180 tablet 3   vitamin B-12 (CYANOCOBALAMIN) 100 MCG tablet Take 100 mcg by mouth daily.     Vitamin D, Cholecalciferol, 50 MCG (2000 UT) CAPS Take 2,000 Units by mouth daily.     No current facility-administered medications for this visit.     Review of Systems Full ROS  was asked and was negative except for the information on the HPI  Physical Exam Blood pressure (!) 151/89, pulse 96, temperature 98.1 F (36.7 C), temperature source Oral, height '5\' 7"'$  (1.702 m), weight 165 lb 12.8 oz (75.2 kg), SpO2 99 %. CONSTITUTIONAL: NAD. EYES: Pupils are equal, round, Sclera are non-icteric. EARS, NOSE, MOUTH AND THROAT: The oral mucosa is pink and moist. Hearing is intact to voice. LYMPH NODES:  Lymph nodes in the neck are normal. RESPIRATORY:  Lungs are clear. There is normal respiratory effort, with equal breath  sounds bilaterally, and without pathologic use of accessory muscles. CARDIOVASCULAR: Heart is regular without murmurs, gallops, or rubs. GI: The abdomen is  soft, nontender, and nondistended. There are no palpable masses. There is no hepatosplenomegaly. There are normal bowel sounds in all quadrants. GU: Rectal deferred.   MUSCULOSKELETAL: Normal muscle strength and tone. No cyanosis or edema.   SKIN: Turgor is good and there are no pathologic skin lesions or ulcers. NEUROLOGIC: Motor and sensation is grossly normal. Cranial nerves are grossly intact. PSYCH:  Oriented to person, place and time. Affect is normal.  Data Reviewed  I have personally reviewed the patient's imaging, laboratory findings and medical records.    Assessment/Plan 76 year old female with symptomatic paraesophageal hernia type III significant reflux symptoms and pulmonary issues.  Discussed with the patient in detail about her disease process and the role for paraesophageal hernia repair.  I do think she will be a reasonable candidate for repair.  I do think that we need to perform our due diligence and I will order a CT scan of the abdomen pelvis to evaluate intra-abdominal anatomy as well as a barium swallow to rule out any achalasia or other motility disorders.  We will also make sure that we obtain cardiac optimization before planned surgery  I will see her back in a few weeks and then we will revisit potential need for surgical intervention.  Please note that I spent 60 minutes in this encounter including personally reviewing imaging studies, coordinating her care, counseling the patient, placing orders and performing appropriate documentation  Caroleen Hamman, MD [place] Surgeon 07/09/2022, 8:48 PM

## 2022-07-09 NOTE — Telephone Encounter (Signed)
Dr. [name] Situ to review, patient was seen last week at which time she appears to be stable.  She has a history of cardiomyopathy with improved EF, hyperlipidemia, DM2, and stage IV CKD.  Since the patient was just seen in the clinic, will defer final clearance to MD  Dr. [name] Situ, please forward your recommendation to P CV DIV PREOP

## 2022-07-09 NOTE — Telephone Encounter (Signed)
Requested medication (s) are due for refill today: no  Requested medication (s) are on the active medication list: no  Last refill:  06/09/22  Future visit scheduled: yes  Notes to clinic:  Unable to refill per protocol, Rx expired.  Medication was discontinued 06/09/22, due to dosage change.     Requested Prescriptions  Pending Prescriptions Disp Refills   omeprazole (PRILOSEC) 20 MG capsule [Pharmacy Med Name: Omeprazole 20 MG Oral Capsule Delayed Release] 180 capsule 3    Sig: TAKE 1 CAPSULE BY MOUTH  TWICE DAILY     Gastroenterology: Proton Pump Inhibitors Passed - 07/07/2022  2:05 PM      Passed - Valid encounter within last 12 months    Recent Outpatient Visits           5 months ago Poorly controlled type 2 diabetes mellitus with neuropathy ([place])   [place], [name] T, NP   8 months ago Poorly controlled type 2 diabetes mellitus with neuropathy ([place])   [place], [name] T, NP   11 months ago Poorly controlled type 2 diabetes mellitus with neuropathy ([place])   [place], [name] T, NP   1 year ago Poorly controlled type 2 diabetes mellitus with neuropathy ([place])   [place], [name] T, NP   1 year ago Poorly controlled type 2 diabetes mellitus with neuropathy ([place])   [place], [name], NP       Future Appointments             In 2 weeks Cannady, [name], NP [employer], [place]   In 2 months [name], [place]

## 2022-07-09 NOTE — Telephone Encounter (Signed)
   Pre-operative Risk Assessment    Patient Name: Tonya [name]  DOB: 08/30/1946 MRN: [id number]      Request for Surgical Clearance    Procedure:   Nissen fundoplication  Date of Surgery:  Clearance TBD                                 Surgeon:  Dr Caroleen Hamman Surgeon's Group or Practice Name:  [facility] Phone number:  [phone] Fax number:  [phone]   Type of Clearance Requested:   - Medical    Type of Anesthesia:  General    Additional requests/questions:    SignedAce Gins   07/09/2022, 4:47 PM

## 2022-07-11 ENCOUNTER — Other Ambulatory Visit: Payer: Self-pay | Admitting: Surgery

## 2022-07-11 ENCOUNTER — Ambulatory Visit
Admission: RE | Admit: 2022-07-11 | Discharge: 2022-07-11 | Disposition: A | Discharge status: Home / Self Care | Payer: Medicare Other | Source: Ambulatory Visit | Attending: Surgery | Admitting: Surgery

## 2022-07-11 DIAGNOSIS — K449 Diaphragmatic hernia without obstruction or gangrene: Secondary | ICD-10-CM | POA: Insufficient documentation

## 2022-07-11 MED ORDER — IOHEXOL 300 MG/ML  SOLN: 100.0000 mL | Freq: Once | INTRAMUSCULAR | Status: DC | PRN

## 2022-07-14 ENCOUNTER — Other Ambulatory Visit: Payer: Self-pay

## 2022-07-14 ENCOUNTER — Telehealth: Payer: Self-pay

## 2022-07-14 NOTE — Telephone Encounter (Signed)
Left detailed message with CT results and reminded to keep scheduled appointment.

## 2022-07-14 NOTE — Progress Notes (Signed)
Cardiology clearance has been received from [facility]. The patient is cleared at acceptable risk for surgery with no further cardiovascular testing needed. All notes are in Epic.

## 2022-07-14 NOTE — Telephone Encounter (Signed)
   Patient Name: SHANEICE BARSANTI  DOB: 01/05/1946 MRN: [id number]  Primary Cardiologist: Ida Rogue, MD  Chart reviewed as part of pre-operative protocol coverage. Given past medical history and time since last visit, based on ACC/AHA guidelines, KELINA BEAUCHAMP would be at acceptable risk for the planned procedure without further cardiovascular testing.   The patient was advised that if she develops new symptoms prior to surgery to contact our office to arrange for a follow-up visit, and she verbalized understanding.  I will route this recommendation to the requesting party via Epic fax function and remove from pre-op pool.  Please call with questions.  Mable Fill, Marissa Nestle, NP 07/14/2022, 7:51 AM

## 2022-07-15 DIAGNOSIS — N184 Chronic kidney disease, stage 4 (severe): Secondary | ICD-10-CM | POA: Diagnosis not present

## 2022-07-15 DIAGNOSIS — R809 Proteinuria, unspecified: Secondary | ICD-10-CM | POA: Diagnosis not present

## 2022-07-15 DIAGNOSIS — D631 Anemia in chronic kidney disease: Secondary | ICD-10-CM | POA: Diagnosis not present

## 2022-07-15 DIAGNOSIS — N2581 Secondary hyperparathyroidism of renal origin: Secondary | ICD-10-CM | POA: Diagnosis not present

## 2022-07-15 DIAGNOSIS — I10 Essential (primary) hypertension: Secondary | ICD-10-CM | POA: Diagnosis not present

## 2022-07-15 DIAGNOSIS — E1122 Type 2 diabetes mellitus with diabetic chronic kidney disease: Secondary | ICD-10-CM | POA: Diagnosis not present

## 2022-07-16 ENCOUNTER — Other Ambulatory Visit: Payer: Medicare Other

## 2022-07-18 ENCOUNTER — Ambulatory Visit
Admission: RE | Admit: 2022-07-18 | Discharge: 2022-07-18 | Disposition: A | Discharge status: Home / Self Care | Payer: Medicare Other | Source: Ambulatory Visit | Attending: Surgery | Admitting: Surgery

## 2022-07-18 DIAGNOSIS — R1310 Dysphagia, unspecified: Secondary | ICD-10-CM | POA: Insufficient documentation

## 2022-07-18 DIAGNOSIS — K219 Gastro-esophageal reflux disease without esophagitis: Secondary | ICD-10-CM | POA: Diagnosis not present

## 2022-07-18 DIAGNOSIS — K449 Diaphragmatic hernia without obstruction or gangrene: Secondary | ICD-10-CM | POA: Diagnosis not present

## 2022-07-21 ENCOUNTER — Telehealth: Payer: Self-pay

## 2022-07-21 ENCOUNTER — Inpatient Hospital Stay: Payer: Medicare Other | Attending: Oncology

## 2022-07-21 DIAGNOSIS — D631 Anemia in chronic kidney disease: Secondary | ICD-10-CM | POA: Diagnosis not present

## 2022-07-21 DIAGNOSIS — N184 Chronic kidney disease, stage 4 (severe): Secondary | ICD-10-CM | POA: Diagnosis not present

## 2022-07-21 LAB — CBC WITH DIFFERENTIAL/PLATELET
Abs Immature Granulocytes: 0.02 10*3/uL (ref 0.00–0.07)
Basophils Absolute: 0 10*3/uL (ref 0.0–0.1)
Basophils Relative: 1 %
Eosinophils Absolute: 0.2 10*3/uL (ref 0.0–0.5)
Eosinophils Relative: 3 %
HCT: 29.2 % — ABNORMAL LOW (ref 36.0–46.0)
Hemoglobin: 8.9 g/dL — ABNORMAL LOW (ref 12.0–15.0)
Immature Granulocytes: 0 %
Lymphocytes Relative: 29 %
Lymphs Abs: 1.6 10*3/uL (ref 0.7–4.0)
MCH: 27.1 pg (ref 26.0–34.0)
MCHC: 30.5 g/dL (ref 30.0–36.0)
MCV: 88.8 fL (ref 80.0–100.0)
Monocytes Absolute: 0.4 10*3/uL (ref 0.1–1.0)
Monocytes Relative: 8 %
Neutro Abs: 3.1 10*3/uL (ref 1.7–7.7)
Neutrophils Relative %: 59 %
Platelets: 225 10*3/uL (ref 150–400)
RBC: 3.29 MIL/uL — ABNORMAL LOW (ref 3.87–5.11)
RDW: 15.5 % (ref 11.5–15.5)
WBC: 5.3 10*3/uL (ref 4.0–10.5)
nRBC: 0 % (ref 0.0–0.2)

## 2022-07-21 LAB — IRON AND TIBC
Iron: 62 ug/dL (ref 28–170)
Saturation Ratios: 20 % (ref 10.4–31.8)
TIBC: 305 ug/dL (ref 250–450)
UIBC: 243 ug/dL

## 2022-07-21 LAB — FERRITIN: Ferritin: 102 ng/mL (ref 11–307)

## 2022-07-21 NOTE — Telephone Encounter (Signed)
-----   Message from [name], MD sent at 07/18/2022  4:49 PM EDT ----- Please let her know study showed known hiatal hernia but no other surprises  ----- Message ----- From: Interface, Rad Results In Sent: 07/18/2022   4:31 PM EDT To: [name], MD

## 2022-07-21 NOTE — Telephone Encounter (Signed)
Message left for patient letting her know that her scan only showed a hiatal hernia and no other problems.

## 2022-07-22 ENCOUNTER — Other Ambulatory Visit: Payer: Self-pay | Admitting: Cardiovascular Disease

## 2022-07-23 ENCOUNTER — Inpatient Hospital Stay: Payer: Medicare Other | Admitting: Oncology

## 2022-07-23 ENCOUNTER — Inpatient Hospital Stay: Payer: Medicare Other

## 2022-07-23 ENCOUNTER — Encounter: Payer: Self-pay | Admitting: Oncology

## 2022-07-23 VITALS — BP 136/68

## 2022-07-23 VITALS — BP 112/67 | HR 84 | Temp 96.4°F | Ht 67.0 in | Wt 166.0 lb

## 2022-07-23 DIAGNOSIS — N184 Chronic kidney disease, stage 4 (severe): Secondary | ICD-10-CM

## 2022-07-23 DIAGNOSIS — D631 Anemia in chronic kidney disease: Secondary | ICD-10-CM

## 2022-07-23 MED ORDER — SODIUM CHLORIDE 0.9 % IV SOLN
Freq: Once | INTRAVENOUS | Status: AC
  Filled 2022-07-23: qty 250

## 2022-07-23 MED ORDER — SODIUM CHLORIDE 0.9 % IV SOLN
200.0000 mg | Freq: Once | INTRAVENOUS | Status: AC
  Administered 2022-07-23: 200 mg via INTRAVENOUS
  Filled 2022-07-23: qty 200

## 2022-07-23 NOTE — Patient Instructions (Signed)

## 2022-07-24 ENCOUNTER — Encounter: Payer: Self-pay | Admitting: Oncology

## 2022-07-24 NOTE — Progress Notes (Signed)
Hematology/Oncology Progress note [phone] [phone]   #   Patient Care Team: [name], NP as PCP - General (Nurse Practitioner) [name], MD as PCP - Cardiology (Cardiology) Solum, Betsey Holiday, MD as Physician Assistant (Endocrinology) Lavonia Dana, MD as Consulting Physician (Nephrology) [name], MD as Consulting Physician (Cardiology) Pa, [place] (Optometry)  REFERRING PROVIDER: Dr.Cannady CHIEF COMPLAINTS/REASON FOR VISIT:  Follow-up for anemia  HISTORY OF PRESENTING ILLNESS:  Tonya [name] is a  76 y.o.  female with PMH listed below who was referred to me for evaluation of anemia Reviewed patient's recent labs that was done at [facility] office. Labs revealed anemia with hemoglobin of 8.9, MCV 82, normal platelet and wbc  Reviewed patient's previous labs ordered by primary care physician's office, anemia is chronic onset , duration is since 2017 No aggravating or improving factors.  Associated signs and symptoms: Patient reports fatigue.  SOB with exertion.  Denies weight loss, easy bruising, hematochezia, hemoptysis, hematuria. Context: History of GI bleeding: denies               History of Chronic kidney disease: CKD, follows up with Dr.Kolluru.                History of autoimmune disease: denies               History of hemolytic anemia. denies               Last colonoscopy: underwent colonoscopy on 06/06/2019.  Due to poor preparation, patient was scheduled to repeat colonoscopy due to suboptimal bowel preparation.  Patient told me that she is waiting for insurance approval for repeat procedure.  INTERVAL HISTORY Tonya [name] is a 76 y.o. female who has above history reviewed by me today presents to reestablish care for chronic anemia. Patient tolerates Venofer treatments.  Chronic fatigue, unchanged.  Review of Systems  Constitutional:  Positive for fatigue. Negative for appetite change, chills and  fever.  HENT:   Negative for hearing loss and voice change.   Eyes:  Negative for eye problems.  Respiratory:  Negative for chest tightness and cough.   Cardiovascular:  Negative for chest pain.  Gastrointestinal:  Negative for abdominal distention, abdominal pain and blood in stool.  Endocrine: Negative for hot flashes.  Genitourinary:  Negative for difficulty urinating and frequency.   Musculoskeletal:  Negative for arthralgias.  Skin:  Negative for itching and rash.  Neurological:  Negative for extremity weakness.  Hematological:  Negative for adenopathy.  Psychiatric/Behavioral:  Negative for confusion.     MEDICAL HISTORY:  Past Medical History:  Diagnosis Date   (HFpEF) heart failure with preserved ejection fraction (Tonya [name])    a. 05/2017 Echo: EF 55-60%, Gr1 DD, mildly dil LA.   CKD (chronic kidney disease), stage III ([place])    Diabetes mellitus without complication ([place])    Hyperlipidemia    Hypertension    Stroke ([place])    a. 08/2017.   Wears dentures    partial upper    SURGICAL HISTORY: Past Surgical History:  Procedure Laterality Date   CATARACT EXTRACTION W/PHACO Left 02/05/2021   Procedure: CATARACT EXTRACTION PHACO AND INTRAOCULAR LENS PLACEMENT (IOC) LEFT DIABETIC 5.47 00:51.4;  Surgeon: Birder Robson, MD;  Location: [place];  Service: Ophthalmology;  Laterality: Left;  Diabetic - insulin and oral meds   CATARACT EXTRACTION W/PHACO Right 03/19/2021   Procedure: CATARACT EXTRACTION PHACO AND INTRAOCULAR LENS PLACEMENT (IOC) RIGHT DIABETIC 6.09 00:39.6;  Surgeon: Birder Robson, MD;  Location: [place];  Service: Ophthalmology;  Laterality: Right;   COLONOSCOPY  12/15/2006   COLONOSCOPY WITH PROPOFOL N/A 06/06/2019   Procedure: COLONOSCOPY WITH PROPOFOL;  Surgeon: [name], MD;  Location: [facility] ENDOSCOPY;  Service: Gastroenterology;  Laterality: N/A;   COLONOSCOPY WITH PROPOFOL N/A 01/16/2020   Procedure: COLONOSCOPY WITH BIOPSY;   Surgeon: [name], MD;  Location: [place];  Service: Endoscopy;  Laterality: N/A;  Diabetic - insulin and oral meds   ESOPHAGOGASTRODUODENOSCOPY (EGD) WITH PROPOFOL N/A 06/23/2022   Procedure: ESOPHAGOGASTRODUODENOSCOPY (EGD) WITH PROPOFOL;  Surgeon: [name], MD;  Location: [facility] ENDOSCOPY;  Service: Gastroenterology;  Laterality: N/A;   EYE SURGERY     POLYPECTOMY N/A 01/16/2020   Procedure: POLYPECTOMY;  Surgeon: [name], MD;  Location: [place];  Service: Endoscopy;  Laterality: N/A;   TUBAL LIGATION      SOCIAL HISTORY: Social History   Socioeconomic History   Marital status: Married    Spouse name: Not on file   Number of children: Not on file   Years of education: Not on file   Highest education level: GED or equivalent  Occupational History   Occupation: retired  Tobacco Use   Smoking status: Never   Smokeless tobacco: Never  Vaping Use   Vaping Use: Never used  Substance and Sexual Activity   Alcohol use: No    Alcohol/week: 0.0 standard drinks of alcohol   Drug use: No   Sexual activity: Yes  Other Topics Concern   Not on file  Social History Narrative   Not on file   Social Determinants of Health   Financial Resource Strain: Low Risk  (11/12/2021)   Overall Financial Resource Strain (CARDIA)    Difficulty of Paying Living Expenses: Not hard at all  Food Insecurity: No Food Insecurity (11/12/2021)   Hunger Vital Sign    Worried About Running Out of Food in the Last Year: Never true    [place] in the Last Year: Never true  Transportation Needs: No Transportation Needs (11/12/2021)   PRAPARE - Hydrologist (Medical): No    Lack of Transportation (Non-Medical): No  Physical Activity: Inactive (11/12/2021)   Exercise Vital Sign    Days of Exercise per Week: 0 days    Minutes of Exercise per Session: 0 min  Stress: No Stress Concern Present (11/12/2021)   [place]    Feeling of Stress : Not at all  Social Connections: [place] (11/12/2021)   Social Connection and Isolation Panel [NHANES]    Frequency of Communication with Friends and Family: Twice a week    Frequency of Social Gatherings with Friends and Family: Twice a week    Attends Religious Services: More than 4 times per year    Active Member of [employer] or Organizations: Yes    Attends Archivist Meetings: 1 to 4 times per year    Marital Status: Married  Human resources officer Violence: Not At Risk (11/12/2021)   Humiliation, Afraid, Rape, and Kick questionnaire    Fear of Current or Ex-Partner: No    Emotionally Abused: No    Physically Abused: No    Sexually Abused: No    FAMILY HISTORY: Family History  Problem Relation Age of Onset   Diabetes Mother    Stroke Mother    Hyperlipidemia Father    Hypertension Father    Diabetes Sister  Diabetes Brother    Gout Son    Diabetes Brother    Kidney disease Son    Lymphoma Sister    Heart attack Sister    Breast cancer Neg Hx     ALLERGIES:  is allergic to atorvastatin, gabapentin, pravastatin, pregabalin, and trulicity [dulaglutide].  MEDICATIONS:  Current Outpatient Medications  Medication Sig Dispense Refill   aspirin 81 MG tablet Take 81 mg by mouth daily.     carvedilol (COREG) 3.125 MG tablet TAKE 1 TABLET BY MOUTH TWICE  DAILY WITH MEALS 180 tablet 3   clopidogrel (PLAVIX) 75 MG tablet TAKE 1 TABLET BY MOUTH  DAILY 90 tablet 4   docusate sodium (COLACE) 100 MG capsule Take 100 mg by mouth daily as needed for mild constipation.     HUMALOG KWIKPEN 100 UNIT/ML KiwkPen 6 Units. Take 6 units before breakfast, lunch, supper if glucose greater than 70  11   methocarbamol (ROBAXIN) 500 MG tablet Take 1 tablet by mouth daily.     NOVOFINE 32G X 6 MM MISC USE AS DIRECTED THREE TIMES DAILY WITH HUMALOG 100 each 3   olmesartan (BENICAR) 40 MG tablet Take 1 tablet (40 mg  total) by mouth daily. 90 tablet 4   omeprazole (PRILOSEC) 40 MG capsule Take 1 capsule (40 mg total) by mouth in the morning and at bedtime. 180 capsule 0   pioglitazone (ACTOS) 30 MG tablet Take 30 mg by mouth daily.     rosuvastatin (CRESTOR) 5 MG tablet Take 1 tablet (5 mg total) by mouth daily. 90 tablet 3   torsemide (DEMADEX) 20 MG tablet Take 2 tablets (40 mg total) by mouth daily. 180 tablet 3   vitamin B-12 (CYANOCOBALAMIN) 100 MCG tablet Take 100 mcg by mouth daily.     Vitamin D, Cholecalciferol, 50 MCG (2000 UT) CAPS Take 2,000 Units by mouth daily.     No current facility-administered medications for this visit.     PHYSICAL EXAMINATION: ECOG PERFORMANCE STATUS: 1 - Symptomatic but completely ambulatory Vitals:   07/23/22 1314  BP: 112/67  Pulse: 84  Temp: (!) 96.4 F (35.8 C)   Filed Weights   07/23/22 1314  Weight: 166 lb (75.3 kg)    Physical Exam Constitutional:      General: She is not in acute distress. HENT:     Head: Normocephalic and atraumatic.  Eyes:     General: No scleral icterus.    Pupils: Pupils are equal, round, and reactive to light.  Cardiovascular:     Rate and Rhythm: Normal rate.  Pulmonary:     Effort: Pulmonary effort is normal. No respiratory distress.     Breath sounds: No wheezing.  Abdominal:     General: Bowel sounds are normal. There is no distension.     Palpations: Abdomen is soft.  Musculoskeletal:        General: No deformity. Normal range of motion.     Cervical back: Normal range of motion and neck supple.  Skin:    General: Skin is warm and dry.     Findings: No erythema or rash.  Neurological:     Mental Status: She is alert and oriented to person, place, and time.     Cranial Nerves: No cranial nerve deficit.     Coordination: Coordination normal.  Psychiatric:        Mood and Affect: Mood normal.      LABORATORY DATA:  I have reviewed the data as listed  Latest Ref Rng & Units 07/21/2022    2:07 PM  04/15/2022    1:34 PM 02/12/2021    5:06 PM  CBC  WBC 4.0 - 10.5 K/uL 5.3  5.6  6.5   Hemoglobin 12.0 - 15.0 g/dL 8.9  8.5  8.8   Hematocrit 36.0 - 46.0 % 29.2  27.7  28.4   Platelets 150 - 400 K/uL 225  280  277       Latest Ref Rng & Units 04/15/2022    1:34 PM 02/12/2021    5:06 PM 01/29/2021   11:10 AM  CMP  Glucose 70 - 99 mg/dL 278  204  168   BUN 8 - 23 mg/dL 39  53  57   Creatinine 0.44 - 1.00 mg/dL 2.26  2.45  2.60   Sodium 135 - 145 mmol/L 140  139  145   Potassium 3.5 - 5.1 mmol/L 4.6  4.3  4.8   Chloride 98 - 111 mmol/L 109  109  108   CO2 22 - 32 mmol/L '25  21  17   '$ Calcium 8.9 - 10.3 mg/dL 8.8  8.7  8.9   Total Protein 6.5 - 8.1 g/dL 6.3  6.6    Total Bilirubin 0.3 - 1.2 mg/dL 0.6  0.6    Alkaline Phos 38 - 126 U/L 57  62    AST 15 - 41 U/L 21  21    ALT 0 - 44 U/L 12  12     Lab Results  Component Value Date   IRON 62 07/21/2022   TIBC 305 07/21/2022   FERRITIN 102 07/21/2022         ASSESSMENT & PLAN:  1. Anemia in stage 4 chronic kidney disease ([place])   2. Chronic kidney disease, stage 4, severely decreased GFR (H[facility])    History of iron deficiency anemia,  anemia secondary to chronic kidney disease. Labs reviewed and discussed with patient She tolerates IV Venofer treatments Hemoglobin is stable at 8.9, ferritin has improved to 102.  Iron saturation 20 Recommend additional IV Venofer x 2 to further increase iron stores.  Goal of ferritin 200  Recommend erythropoietin replacement therapy.  Rationale and potential side effects were reviewed and discussed with patient. She agrees with the plan.  Proceed with Procrit 20,000 units   CKD, encourage oral hydration, avoid nephrotoxins. Protein electrophoresis, light chain ratio were checked in 2020 and were negative for M protein, normal light chain ratio.   All questions were answered. The patient knows to call the clinic with any problems questions or concerns.  Return of visit:  Venofer today.  Venofer  next week, and also Procrit next week  4 weeks flex, lab prior to MD +/- Procrit     Earlie Server, MD, PhD Hematology Oncology  07/24/2022

## 2022-07-25 DIAGNOSIS — E113312 Type 2 diabetes mellitus with moderate nonproliferative diabetic retinopathy with macular edema, left eye: Secondary | ICD-10-CM | POA: Diagnosis not present

## 2022-07-26 DIAGNOSIS — K449 Diaphragmatic hernia without obstruction or gangrene: Secondary | ICD-10-CM | POA: Insufficient documentation

## 2022-07-28 ENCOUNTER — Ambulatory Visit (INDEPENDENT_AMBULATORY_CARE_PROVIDER_SITE_OTHER): Payer: Medicare Other | Admitting: Surgery

## 2022-07-28 ENCOUNTER — Other Ambulatory Visit: Payer: Self-pay

## 2022-07-28 ENCOUNTER — Encounter: Payer: Self-pay | Admitting: Surgery

## 2022-07-28 VITALS — BP 134/81 | HR 79 | Temp 98.6°F | Ht 67.0 in | Wt 166.2 lb

## 2022-07-28 DIAGNOSIS — K449 Diaphragmatic hernia without obstruction or gangrene: Secondary | ICD-10-CM

## 2022-07-28 DIAGNOSIS — K219 Gastro-esophageal reflux disease without esophagitis: Secondary | ICD-10-CM | POA: Diagnosis not present

## 2022-07-28 NOTE — Patient Instructions (Signed)
Our surgery scheduler Pamala Hurry will call you within 24-48 hours to get you scheduled. If you have not heard from her after 48 hours, please call our office. Have the blue sheet available when she calls to write down important information.   If you have any concerns or questions, please feel free to call our office.   Hiatal Hernia  A hiatal hernia occurs when part of the stomach slides above the muscle that separates the abdomen from the chest (diaphragm). A person can be born with a hiatal hernia (congenital), or it may develop over time. In almost all cases of hiatal hernia, only the top part of the stomach pushes through the diaphragm. Many people have a hiatal hernia with no symptoms. The larger the hernia, the more likely it is that you will have symptoms. In some cases, a hiatal hernia allows stomach acid to flow back into the tube that carries food from your mouth to your stomach (esophagus). This may cause heartburn symptoms. The development of heartburn symptoms may mean that you have a condition called gastroesophageal reflux disease (GERD). What are the causes? This condition is caused by a weakness in the opening (hiatus) where the esophagus passes through the diaphragm to attach to the upper part of the stomach. A person may be born with a weakness in the hiatus, or a weakness can develop over time. What increases the risk? This condition is more likely to develop in: Older people. Age is a major risk factor for a hiatal hernia, especially if you are over the age of 34. Pregnant women. People who are overweight. People who have frequent constipation. What are the signs or symptoms? Symptoms of this condition usually develop in the form of GERD symptoms. Symptoms include: Heartburn. Upset stomach (indigestion). Trouble swallowing. Coughing or wheezing. Wheezing is making high-pitched whistling sounds when you breathe. Sore throat. Chest pain. Nausea and vomiting. How is this  diagnosed? This condition may be diagnosed during testing for GERD. Tests that may be done include: X-rays of your stomach or chest. An upper gastrointestinal (GI) series. This is an X-ray exam of your GI tract that is taken after you swallow a chalky liquid that shows up clearly on the X-ray. Endoscopy. This is a procedure to look into your stomach using a thin, flexible tube that has a tiny camera and light on the end of it. How is this treated? This condition may be treated by: Dietary and lifestyle changes to help reduce GERD symptoms. Medicines. These may include: Over-the-counter antacids. Medicines that make your stomach empty more quickly. Medicines that block the production of stomach acid (H2 blockers). Stronger medicines to reduce stomach acid (proton pump inhibitors). Surgery to repair the hernia, if other treatments are not helping. If you have no symptoms, you may not need treatment. Follow these instructions at home: Lifestyle and activity Do not use any products that contain nicotine or tobacco. These products include cigarettes, chewing tobacco, and vaping devices, such as e-cigarettes. If you need help quitting, ask your health care provider. Try to achieve and maintain a healthy body weight. Avoid putting pressure on your abdomen. Anything that puts pressure on your abdomen increases the amount of acid that may be pushed up into your esophagus. Avoid bending over, especially after eating. Raise the head of your bed by putting blocks under the legs. This keeps your head and esophagus higher than your stomach. Do not wear tight clothing around your chest or stomach. Try not to strain when having  a bowel movement, when urinating, or when lifting heavy objects. Eating and drinking Avoid foods that can worsen GERD symptoms. These may include: Fatty foods, like fried foods. Citrus fruits, like oranges or lemon. Other foods and drinks that contain acid, like orange juice or  tomatoes. Spicy food. Chocolate. Eat frequent small meals instead of three large meals a day. This helps prevent your stomach from getting too full. Eat slowly. Do not lie down right after eating. Do not eat 1-2 hours before bed. Do not drink beverages with caffeine. These include cola, coffee, cocoa, and tea. Do not drink alcohol. General instructions Take over-the-counter and prescription medicines only as told by your health care provider. Keep all follow-up visits. Your health care provider will want to check that any new prescribed medicines are helping your symptoms. Contact a health care provider if: Your symptoms are not controlled with medicines or lifestyle changes. You are having trouble swallowing. You have coughing or wheezing that will not go away. Your pain is getting worse. Your pain spreads to your arms, neck, jaw, teeth, or back. You feel nauseous or you vomit. Get help right away if: You have shortness of breath. You vomit blood. You have bright red blood in your stools. You have black, tarry stools. These symptoms may be an emergency. Get help right away. Call 911. Do not wait to see if the symptoms will go away. Do not drive yourself to the hospital. Summary A hiatal hernia occurs when part of the stomach slides above the muscle that separates the abdomen from the chest. A person may be born with a weakness in the hiatus, or a weakness can develop over time. Symptoms of a hiatal hernia may include heartburn, trouble swallowing, or sore throat. Management of a hiatal hernia includes eating frequent small meals instead of three large meals a day. Get help right away if you vomit blood, have bright red blood in your stools, or have black, tarry stools. This information is not intended to replace advice given to you by your health care provider. Make sure you discuss any questions you have with your health care provider. Document Revised: 01/28/2022 Document  Reviewed: 01/28/2022 Elsevier Patient Education  [place].

## 2022-07-29 ENCOUNTER — Ambulatory Visit: Payer: Medicare Other | Admitting: Nurse Practitioner

## 2022-07-29 ENCOUNTER — Encounter: Payer: Self-pay | Admitting: Surgery

## 2022-07-29 DIAGNOSIS — Z794 Long term (current) use of insulin: Secondary | ICD-10-CM

## 2022-07-29 DIAGNOSIS — B0229 Other postherpetic nervous system involvement: Secondary | ICD-10-CM

## 2022-07-29 DIAGNOSIS — N2581 Secondary hyperparathyroidism of renal origin: Secondary | ICD-10-CM

## 2022-07-29 DIAGNOSIS — D631 Anemia in chronic kidney disease: Secondary | ICD-10-CM

## 2022-07-29 DIAGNOSIS — I130 Hypertensive heart and chronic kidney disease with heart failure and stage 1 through stage 4 chronic kidney disease, or unspecified chronic kidney disease: Secondary | ICD-10-CM

## 2022-07-29 DIAGNOSIS — E1140 Type 2 diabetes mellitus with diabetic neuropathy, unspecified: Secondary | ICD-10-CM

## 2022-07-29 DIAGNOSIS — K449 Diaphragmatic hernia without obstruction or gangrene: Secondary | ICD-10-CM

## 2022-07-29 DIAGNOSIS — E1169 Type 2 diabetes mellitus with other specified complication: Secondary | ICD-10-CM

## 2022-07-29 DIAGNOSIS — I5032 Chronic diastolic (congestive) heart failure: Secondary | ICD-10-CM

## 2022-07-29 DIAGNOSIS — E559 Vitamin D deficiency, unspecified: Secondary | ICD-10-CM

## 2022-07-29 DIAGNOSIS — N184 Chronic kidney disease, stage 4 (severe): Secondary | ICD-10-CM

## 2022-07-29 NOTE — Progress Notes (Signed)
Outpatient Surgical Follow Up  07/29/2022  Tonya [name] is an 76 y.o. female.   Chief Complaint  Patient presents with   Follow-up    Hiatal hernia    HPI: Tonya [name] is a 76 y.o. female following up for a paraesophageal hernia.  She does have a history of some chronic pulmonary issues.  She does have a history of chronic kidney disease a stage III, diabetes, hypertension and a prior stroke 5 years ago or so.  She has a preserved ejection fraction.  She also reports significant reflux that is only partially controlled with PPI.  More recently having cough and then having some issues with her lungs. She already had an EGD by Dr. Vicente Males that I have personally reviewed showing evidence of a large paraesophageal hernia.  There was no evidence of a strictures. She also had a CT scan that I have personally reviewed showing evidence of paraesophageal hernia type III.  There is about a half of the stomach within the mediastinum. There was solid shows no evidence of achalasia or strictures Cardiology thinks she is optimized She is currently on Plavix and aspirin for stroke She walks w a cane.  Past Medical History:  Diagnosis Date   (HFpEF) heart failure with preserved ejection fraction ([place])    a. 05/2017 Echo: EF 55-60%, Gr1 DD, mildly dil LA.   CKD (chronic kidney disease), stage III ([place])    Diabetes mellitus without complication ([place])    Hyperlipidemia    Hypertension    Stroke ([place])    a. 08/2017.   Wears dentures    partial upper    Past Surgical History:  Procedure Laterality Date   CATARACT EXTRACTION W/PHACO Left 02/05/2021   Procedure: CATARACT EXTRACTION PHACO AND INTRAOCULAR LENS PLACEMENT (IOC) LEFT DIABETIC 5.47 00:51.4;  Surgeon: Birder Robson, MD;  Location: [facility];  Service: Ophthalmology;  Laterality: Left;  Diabetic - insulin and oral meds   CATARACT EXTRACTION W/PHACO Right 03/19/2021   Procedure: CATARACT EXTRACTION PHACO AND INTRAOCULAR LENS  PLACEMENT (IOC) RIGHT DIABETIC 6.09 00:39.6;  Surgeon: Birder Robson, MD;  Location: [facility];  Service: Ophthalmology;  Laterality: Right;   COLONOSCOPY  12/15/2006   COLONOSCOPY WITH PROPOFOL N/A 06/06/2019   Procedure: COLONOSCOPY WITH PROPOFOL;  Surgeon: [name], MD;  Location: [facility] ENDOSCOPY;  Service: Gastroenterology;  Laterality: N/A;   COLONOSCOPY WITH PROPOFOL N/A 01/16/2020   Procedure: COLONOSCOPY WITH BIOPSY;  Surgeon: [name], MD;  Location: [facility];  Service: Endoscopy;  Laterality: N/A;  Diabetic - insulin and oral meds   ESOPHAGOGASTRODUODENOSCOPY (EGD) WITH PROPOFOL N/A 06/23/2022   Procedure: ESOPHAGOGASTRODUODENOSCOPY (EGD) WITH PROPOFOL;  Surgeon: [name], MD;  Location: [facility] ENDOSCOPY;  Service: Gastroenterology;  Laterality: N/A;   EYE SURGERY     POLYPECTOMY N/A 01/16/2020   Procedure: POLYPECTOMY;  Surgeon: [name], MD;  Location: [place];  Service: Endoscopy;  Laterality: N/A;   TUBAL LIGATION      Family History  Problem Relation Age of Onset   Diabetes Mother    Stroke Mother    Hyperlipidemia Father    Hypertension Father    Diabetes Sister    Diabetes Brother    Gout Son    Diabetes Brother    Kidney disease Son    Lymphoma Sister    Heart attack Sister    Breast cancer Neg Hx     Social History:  reports that she has never smoked. She has never used smokeless  tobacco. She reports that she does not drink alcohol and does not use drugs.  Allergies:  Allergies  Allergen Reactions   Atorvastatin     Myalgias    Gabapentin Other (See Comments)    Speech impairment    Pravastatin     Myalgias    Pregabalin     Speech impairment   Trulicity [Dulaglutide] Hives    Medications reviewed.    ROS Full ROS performed and is otherwise negative other than what is stated in HPI   BP 134/81   Pulse 79   Temp 98.6 F (37 C) (Oral)   Ht '5\' 7"'$  (1.702 m)   Wt 166 lb 3.2 oz (75.4 kg)   LMP   (LMP Unknown)   SpO2 96%   BMI 26.03 kg/m   Physical Exam CONSTITUTIONAL: NAD. EYES: Pupils are equal, round, Sclera are non-icteric. EARS, NOSE, MOUTH AND THROAT: The oral mucosa is pink and moist. Hearing is intact to voice. LYMPH NODES:  Lymph nodes in the neck are normal. RESPIRATORY:  Lungs are clear. There is normal respiratory effort, with equal breath sounds bilaterally, and without pathologic use of accessory muscles. CARDIOVASCULAR: Heart is regular without murmurs, gallops, or rubs. GI: The abdomen is  soft, nontender, and nondistended. There are no palpable masses. There is no hepatosplenomegaly. There are normal bowel sounds in all quadrants. GU: Rectal deferred.   MUSCULOSKELETAL: Normal muscle strength and tone. No cyanosis or edema.   SKIN: Turgor is good and there are no pathologic skin lesions or ulcers. NEUROLOGIC: Motor and sensation is grossly normal. Cranial nerves are grossly intact. PSYCH:  Oriented to person, place and time. Affect is normal.    Assessment/Plan: 76 year old female with symptomatic paraesophageal hernia type III significant reflux symptoms and pulmonary issues.  Discussed with the patient in detail about her disease process and the role for paraesophageal hernia repair.  I do think she will be a good candidate for repair.  She has been optimized from a cardiac perspective.  Talk regarding robotic repair.  Risk, benefits and possible implications include but not limited to: Bleeding, infection bowel and esophageal injuries, bloating, chronic pain.  Interested in moving forward but wants her husband to be present for a surgical discussion.  I actually encouraged her to do that I will see her back in a few weeks .  Please note that I spent 40 minutes in this encounter including personally reviewing imaging studies, coordinating her care, counseling the patient, placing orders and performing appropriate documentation  Caroleen Hamman, MD [place]  Surgeon

## 2022-07-30 ENCOUNTER — Inpatient Hospital Stay: Payer: Medicare Other

## 2022-07-30 VITALS — BP 169/86 | HR 72 | Temp 96.9°F | Resp 18

## 2022-07-30 DIAGNOSIS — N184 Chronic kidney disease, stage 4 (severe): Secondary | ICD-10-CM | POA: Diagnosis not present

## 2022-07-30 DIAGNOSIS — D631 Anemia in chronic kidney disease: Secondary | ICD-10-CM | POA: Diagnosis not present

## 2022-07-30 MED ORDER — SODIUM CHLORIDE 0.9 % IV SOLN
200.0000 mg | Freq: Once | INTRAVENOUS | Status: AC
  Administered 2022-07-30: 200 mg via INTRAVENOUS
  Filled 2022-07-30: qty 200

## 2022-07-30 NOTE — Patient Instructions (Signed)
[facility] [place]  Discharge Instructions: Thank you for choosing [facility] to provide your oncology and hematology care.  If you have a lab appointment with the Cancer Center, please go directly to the Cancer Center and check in at the registration area.  Wear comfortable clothing and clothing appropriate for easy access to any Portacath or PICC line.   We strive to give you quality time with your provider. You may need to reschedule your appointment if you arrive late (15 or more minutes).  Arriving late affects you and other patients whose appointments are after yours.  Also, if you miss three or more appointments without notifying the office, you may be dismissed from the clinic at the provider's discretion.      For prescription refill requests, have your pharmacy contact our office and allow 72 hours for refills to be completed.    Today you received the following chemotherapy and/or immunotherapy agents VENOFER      To help prevent nausea and vomiting after your treatment, we encourage you to take your nausea medication as directed.  BELOW ARE SYMPTOMS THAT SHOULD BE REPORTED IMMEDIATELY: *FEVER GREATER THAN 100.4 F (38 C) OR HIGHER *CHILLS OR SWEATING *NAUSEA AND VOMITING THAT IS NOT CONTROLLED WITH YOUR NAUSEA MEDICATION *UNUSUAL SHORTNESS OF BREATH *UNUSUAL BRUISING OR BLEEDING *URINARY PROBLEMS (pain or burning when urinating, or frequent urination) *BOWEL PROBLEMS (unusual diarrhea, constipation, pain near the anus) TENDERNESS IN MOUTH AND THROAT WITH OR WITHOUT PRESENCE OF ULCERS (sore throat, sores in mouth, or a toothache) UNUSUAL RASH, SWELLING OR PAIN  UNUSUAL VAGINAL DISCHARGE OR ITCHING   Items with * indicate a potential emergency and should be followed up as soon as possible or go to the Emergency Department if any problems should occur.  Please show the CHEMOTHERAPY ALERT CARD or IMMUNOTHERAPY ALERT CARD at check-in to the  Emergency Department and triage nurse.  Should you have questions after your visit or need to cancel or reschedule your appointment, please contact [facility]  [phone] and follow the prompts.  Office hours are 8:00 a.m. to 4:30 p.m. Monday - Friday. Please note that voicemails left after 4:00 p.m. may not be returned until the following business day.  We are closed weekends and major holidays. You have access to a nurse at all times for urgent questions. Please call the main number to the clinic [phone] and follow the prompts.  For any non-urgent questions, you may also contact your provider using MyChart. We now offer e-Visits for anyone 18 and older to request care online for non-urgent symptoms. For details visit [email or web address].   Also download the MyChart app! Go to the app store, search "MyChart", open the app, select [facility], and log in with your MyChart username and password.  Masks are optional in the cancer centers. If you would like for your care team to wear a mask while they are taking care of you, please let them know. For doctor visits, patients may have with them one support person who is at least 74 years old. At this time, visitors are not allowed in the infusion area.  Iron Sucrose Injection What is this medication? IRON SUCROSE (EYE ern SOO krose) treats low levels of iron (iron deficiency anemia) in people with kidney disease. Iron is a mineral that plays an important role in making red blood cells, which carry oxygen from your lungs to the rest of your body. This medicine may be   used for other purposes; ask your health care provider or pharmacist if you have questions. COMMON BRAND NAME(S): Venofer What should I tell my care team before I take this medication? They need to know if you have any of these conditions: Anemia not caused by low iron levels Heart disease High levels of iron in the blood Kidney disease Liver  disease An unusual or allergic reaction to iron, other medications, foods, dyes, or preservatives Pregnant or trying to get pregnant Breast-feeding How should I use this medication? This medication is for infusion into a vein. It is given in a hospital or clinic setting. Talk to your care team about the use of this medication in children. While this medication may be prescribed for children as young as 2 years for selected conditions, precautions do apply. Overdosage: If you think you have taken too much of this medicine contact a poison control center or emergency room at once. NOTE: This medicine is only for you. Do not share this medicine with others. What if I miss a dose? It is important not to miss your dose. Call your care team if you are unable to keep an appointment. What may interact with this medication? Do not take this medication with any of the following: Deferoxamine Dimercaprol Other iron products This medication may also interact with the following: Chloramphenicol Deferasirox This list may not describe all possible interactions. Give your health care provider a list of all the medicines, herbs, non-prescription drugs, or dietary supplements you use. Also tell them if you smoke, drink alcohol, or use illegal drugs. Some items may interact with your medicine. What should I watch for while using this medication? Visit your care team regularly. Tell your care team if your symptoms do not start to get better or if they get worse. You may need blood work done while you are taking this medication. You may need to follow a special diet. Talk to your care team. Foods that contain iron include: whole grains/cereals, dried fruits, beans, or peas, leafy green vegetables, and organ meats (liver, kidney). What side effects may I notice from receiving this medication? Side effects that you should report to your care team as soon as possible: Allergic reactions--skin rash, itching, hives,  swelling of the face, lips, tongue, or throat Low blood pressure--dizziness, feeling faint or lightheaded, blurry vision Shortness of breath Side effects that usually do not require medical attention (report to your care team if they continue or are bothersome): Flushing Headache Joint pain Muscle pain Nausea Pain, redness, or irritation at injection site This list may not describe all possible side effects. Call your doctor for medical advice about side effects. You may report side effects to FDA at 1-800-FDA-1088. Where should I keep my medication? This medication is given in a hospital or clinic and will not be stored at home. NOTE: This sheet is a summary. It may not cover all possible information. If you have questions about this medicine, talk to your doctor, pharmacist, or health care provider.  2023 Elsevier/Gold Standard (2008-01-22 00:00:00)   

## 2022-08-01 ENCOUNTER — Inpatient Hospital Stay: Payer: Medicare Other

## 2022-08-01 ENCOUNTER — Other Ambulatory Visit: Payer: Self-pay

## 2022-08-01 VITALS — BP 112/72 | HR 82

## 2022-08-01 DIAGNOSIS — N184 Chronic kidney disease, stage 4 (severe): Secondary | ICD-10-CM | POA: Diagnosis not present

## 2022-08-01 DIAGNOSIS — D631 Anemia in chronic kidney disease: Secondary | ICD-10-CM

## 2022-08-01 LAB — CBC WITH DIFFERENTIAL/PLATELET
Abs Immature Granulocytes: 0.05 10*3/uL (ref 0.00–0.07)
Basophils Absolute: 0 10*3/uL (ref 0.0–0.1)
Basophils Relative: 1 %
Eosinophils Absolute: 0.2 10*3/uL (ref 0.0–0.5)
Eosinophils Relative: 3 %
HCT: 30.9 % — ABNORMAL LOW (ref 36.0–46.0)
Hemoglobin: 9.4 g/dL — ABNORMAL LOW (ref 12.0–15.0)
Immature Granulocytes: 1 %
Lymphocytes Relative: 26 %
Lymphs Abs: 1.6 10*3/uL (ref 0.7–4.0)
MCH: 27.3 pg (ref 26.0–34.0)
MCHC: 30.4 g/dL (ref 30.0–36.0)
MCV: 89.8 fL (ref 80.0–100.0)
Monocytes Absolute: 0.5 10*3/uL (ref 0.1–1.0)
Monocytes Relative: 7 %
Neutro Abs: 3.8 10*3/uL (ref 1.7–7.7)
Neutrophils Relative %: 62 %
Platelets: 245 10*3/uL (ref 150–400)
RBC: 3.44 MIL/uL — ABNORMAL LOW (ref 3.87–5.11)
RDW: 15.2 % (ref 11.5–15.5)
WBC: 6.2 10*3/uL (ref 4.0–10.5)
nRBC: 0.3 % — ABNORMAL HIGH (ref 0.0–0.2)

## 2022-08-01 MED ORDER — EPOETIN ALFA-EPBX 20000 UNIT/ML IJ SOLN
20000.0000 [IU] | Freq: Once | INTRAMUSCULAR | Status: AC
  Administered 2022-08-01: 20000 [IU] via SUBCUTANEOUS
  Filled 2022-08-01: qty 1

## 2022-08-02 NOTE — Patient Instructions (Signed)
Diabetes Mellitus Basics  Diabetes mellitus, or diabetes, is a long-term (chronic) disease. It occurs when the body does not properly use sugar (glucose) that is released from food after you eat. Diabetes mellitus may be caused by one or both of these problems: Your pancreas does not make enough of a hormone called insulin. Your body does not react in a normal way to the insulin that it makes. Insulin lets glucose enter cells in your body. This gives you energy. If you have diabetes, glucose cannot get into cells. This causes high blood glucose (hyperglycemia). How to treat and manage diabetes You may need to take insulin or other diabetes medicines daily to keep your glucose in balance. If you are prescribed insulin, you will learn how to give yourself insulin by injection. You may need to adjust the amount of insulin you take based on the foods that you eat. You will need to check your blood glucose levels using a glucose monitor as told by your health care provider. The readings can help determine if you have low or high blood glucose. Generally, you should have these blood glucose levels: Before meals (preprandial): 80-130 mg/dL (4.4-7.2 mmol/L). After meals (postprandial): below 180 mg/dL (10 mmol/L). Hemoglobin A1c (HbA1c) level: less than 7%. Your health care provider will set treatment goals for you. Keep all follow-up visits. This is important. Follow these instructions at home: Diabetes medicines Take your diabetes medicines every day as told by your health care provider. List your diabetes medicines here: Name of medicine: ______________________________ Amount (dose): _______________ Time (a.m./p.m.): _______________ Notes: ___________________________________ Name of medicine: ______________________________ Amount (dose): _______________ Time (a.m./p.m.): _______________ Notes: ___________________________________ Name of medicine: ______________________________ Amount (dose):  _______________ Time (a.m./p.m.): _______________ Notes: ___________________________________ Insulin If you use insulin, list the types of insulin you use here: Insulin type: ______________________________ Amount (dose): _______________ Time (a.m./p.m.): _______________Notes: ___________________________________ Insulin type: ______________________________ Amount (dose): _______________ Time (a.m./p.m.): _______________ Notes: ___________________________________ Insulin type: ______________________________ Amount (dose): _______________ Time (a.m./p.m.): _______________ Notes: ___________________________________ Insulin type: ______________________________ Amount (dose): _______________ Time (a.m./p.m.): _______________ Notes: ___________________________________ Insulin type: ______________________________ Amount (dose): _______________ Time (a.m./p.m.): _______________ Notes: ___________________________________ Managing blood glucose  Check your blood glucose levels using a glucose monitor as told by your health care provider. Write down the times that you check your glucose levels here: Time: _______________ Notes: ___________________________________ Time: _______________ Notes: ___________________________________ Time: _______________ Notes: ___________________________________ Time: _______________ Notes: ___________________________________ Time: _______________ Notes: ___________________________________ Time: _______________ Notes: ___________________________________  Low blood glucose Low blood glucose (hypoglycemia) is when glucose is at or below 70 mg/dL (3.9 mmol/L). Symptoms may include: Feeling: Hungry. Sweaty and clammy. Irritable or easily upset. Dizzy. Sleepy. Having: A fast heartbeat. A headache. A change in your vision. Numbness around the mouth, lips, or tongue. Having trouble with: Moving (coordination). Sleeping. Treating low blood glucose To treat low blood  glucose, eat or drink something containing sugar right away. If you can think clearly and swallow safely, follow the 15:15 rule: Take 15 grams of a fast-acting carb (carbohydrate), as told by your health care provider. Some fast-acting carbs are: Glucose tablets: take 3-4 tablets. Hard candy: eat 3-5 pieces. Fruit juice: drink 4 oz (120 mL). Regular (not diet) soda: drink 4-6 oz (120-180 mL). Honey or sugar: eat 1 Tbsp (15 mL). Check your blood glucose levels 15 minutes after you take the carb. If your glucose is still at or below 70 mg/dL (3.9 mmol/L), take 15 grams of a carb again. If your glucose does not go above 70 mg/dL (3.9 mmol/L) after   3 tries, get help right away. After your glucose goes back to normal, eat a meal or a snack within 1 hour. Treating very low blood glucose If your glucose is at or below 54 mg/dL (3 mmol/L), you have very low blood glucose (severe hypoglycemia). This is an emergency. Do not wait to see if the symptoms will go away. Get medical help right away. Call your local emergency services (911 in the [place] Do not drive yourself to the hospital. Questions to ask your health care provider Should I talk with a diabetes educator? What equipment will I need to care for myself at home? What diabetes medicines do I need? When should I take them? How often do I need to check my blood glucose levels? What number can I call if I have questions? When is my follow-up visit? Where can I find a support group for people with diabetes? Where to find more information American Diabetes Association: www.diabetes.org Association of Diabetes Care and Education Specialists: www.diabeteseducator.org Contact a health care provider if: Your blood glucose is at or above 240 mg/dL (13.3 mmol/L) for 2 days in a row. You have been sick or have had a fever for 2 days or more, and you are not getting better. You have any of these problems for more than 6 hours: You cannot eat or  drink. You feel nauseous. You vomit. You have diarrhea. Get help right away if: Your blood glucose is lower than 54 mg/dL (3 mmol/L). You get confused. You have trouble thinking clearly. You have trouble breathing. These symptoms may represent a serious problem that is an emergency. Do not wait to see if the symptoms will go away. Get medical help right away. Call your local emergency services (911 in the [place] Do not drive yourself to the hospital. Summary Diabetes mellitus is a chronic disease that occurs when the body does not properly use sugar (glucose) that is released from food after you eat. Take insulin and diabetes medicines as told. Check your blood glucose every day, as often as told. Keep all follow-up visits. This is important. This information is not intended to replace advice given to you by your health care provider. Make sure you discuss any questions you have with your health care provider. Document Revised: 04/03/2020 Document Reviewed: 04/03/2020 Elsevier Patient Education  2023 Elsevier Inc.  

## 2022-08-03 IMAGING — MG MM DIGITAL SCREENING BILAT W/ TOMO AND CAD
8 series · 8 of 24 positions shown · non-contrast
Comparison: Previous exam(s).

CLINICAL DATA: Screening.

EXAM:
DIGITAL SCREENING BILATERAL MAMMOGRAM WITH TOMO AND CAD

[R MLO synth-2D]
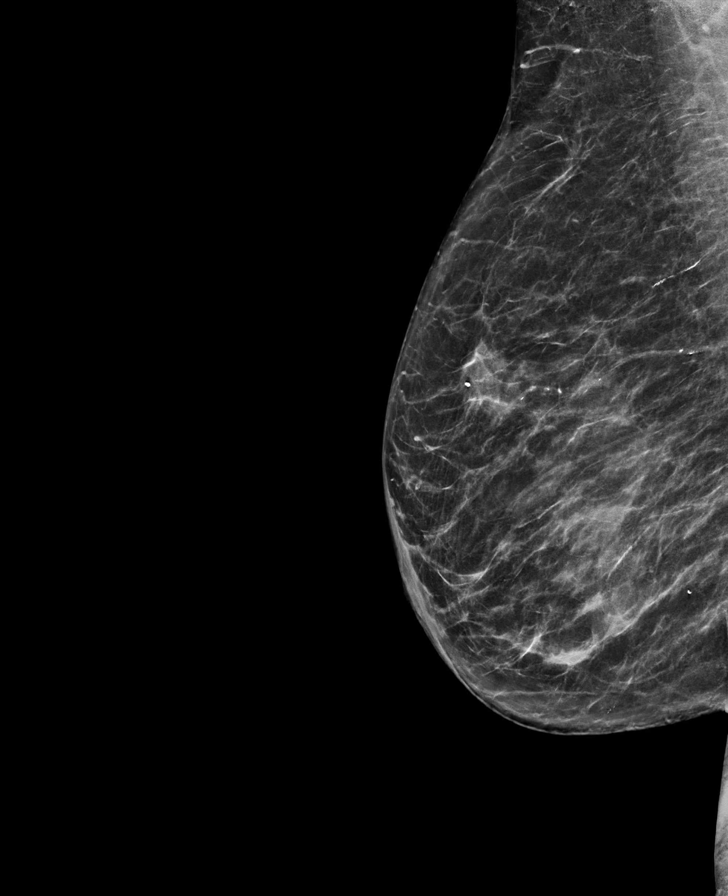

[L CC synth-2D]
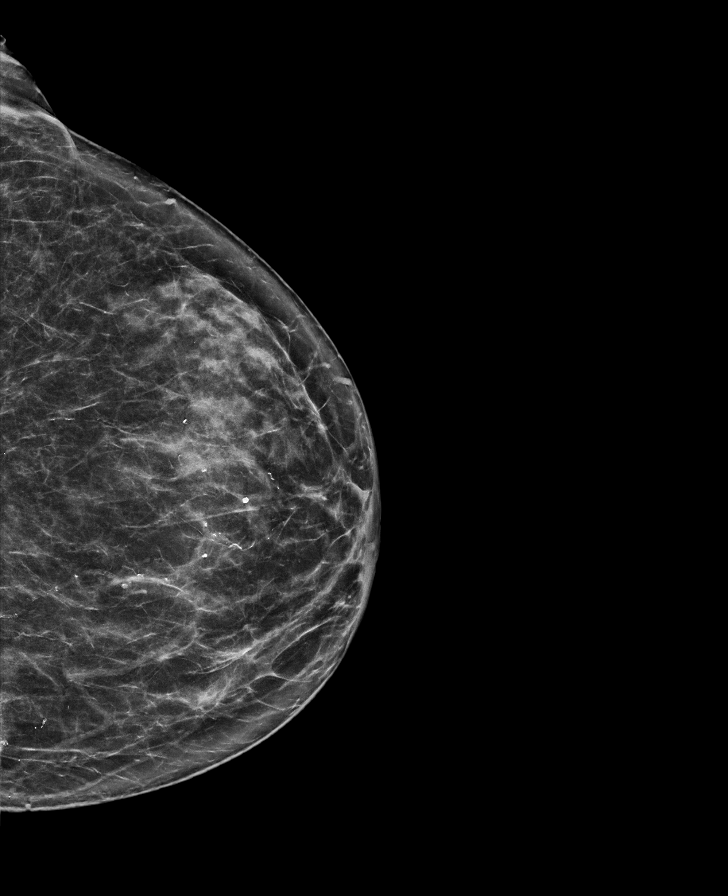

[L MLO synth-2D]
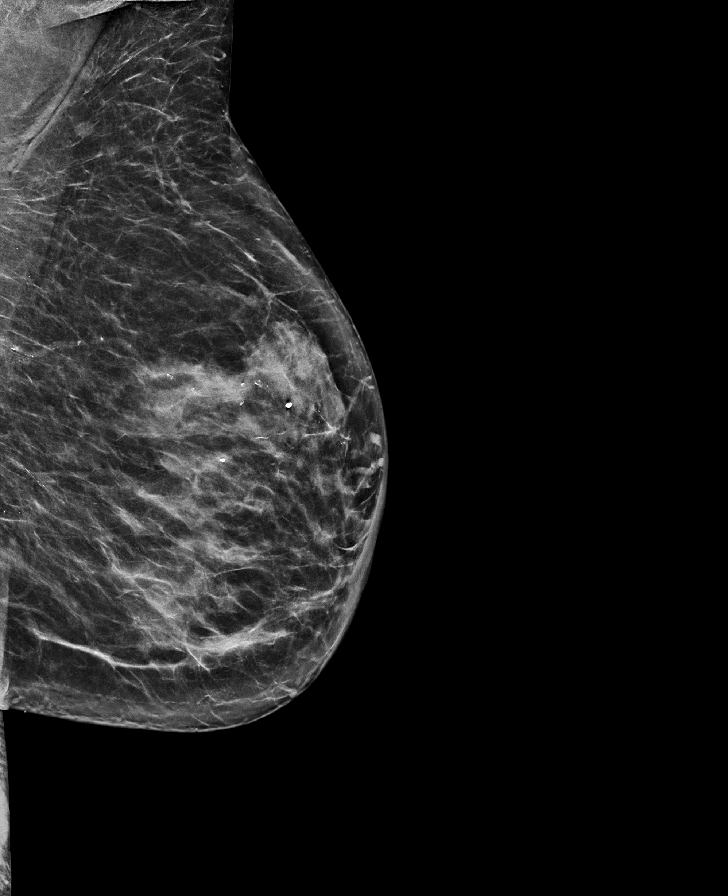

[R CC synth-2D]
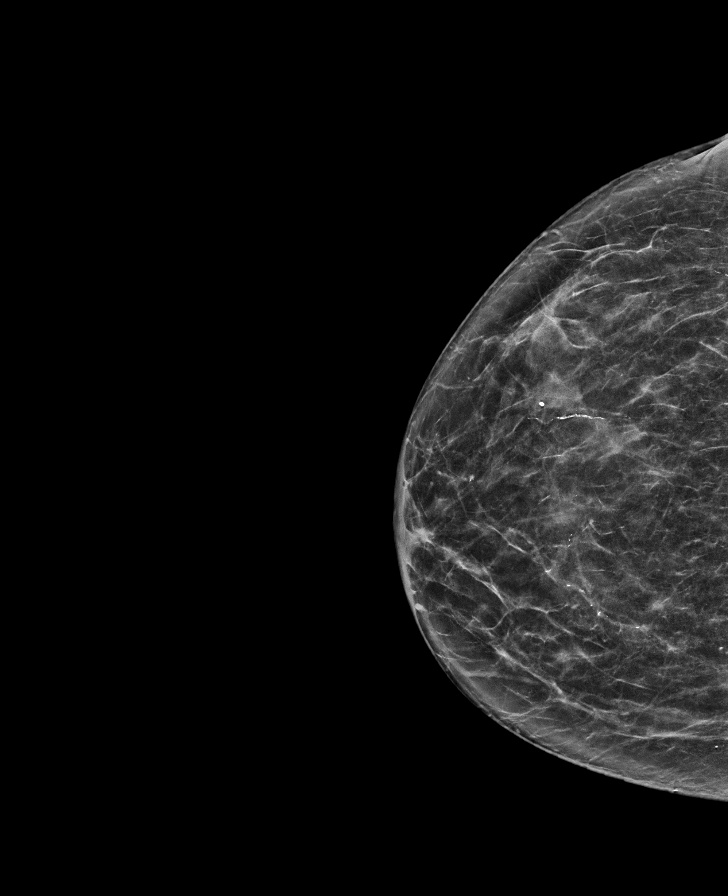

[R MLO tomo · tomo slice 35/69.0]
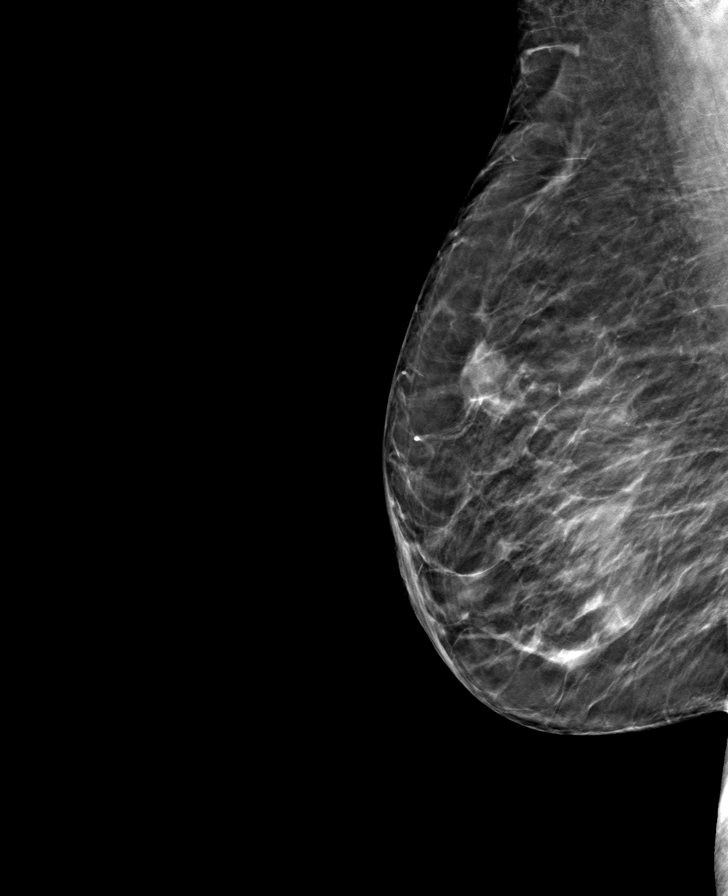

[L MLO tomo · tomo slice 37/72.0]
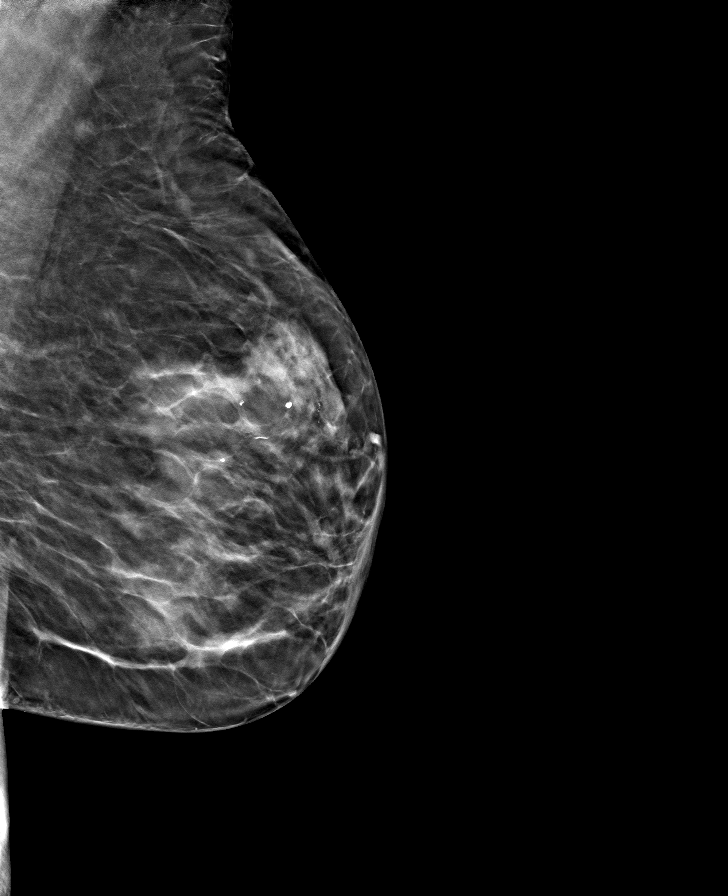

[L CC tomo · tomo slice 35/69.0]
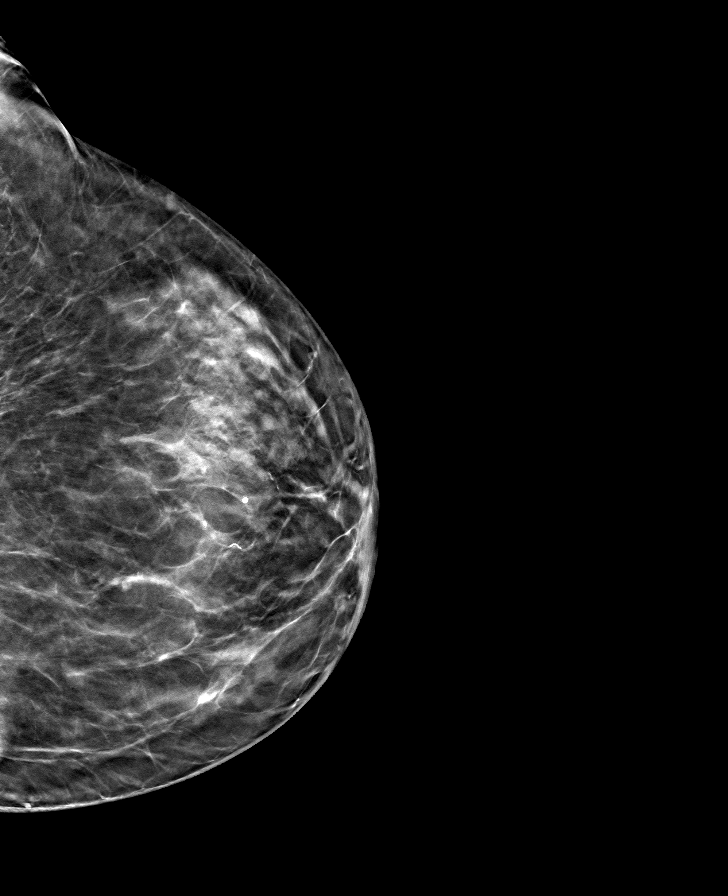

[R CC tomo · tomo slice 33/66.0]
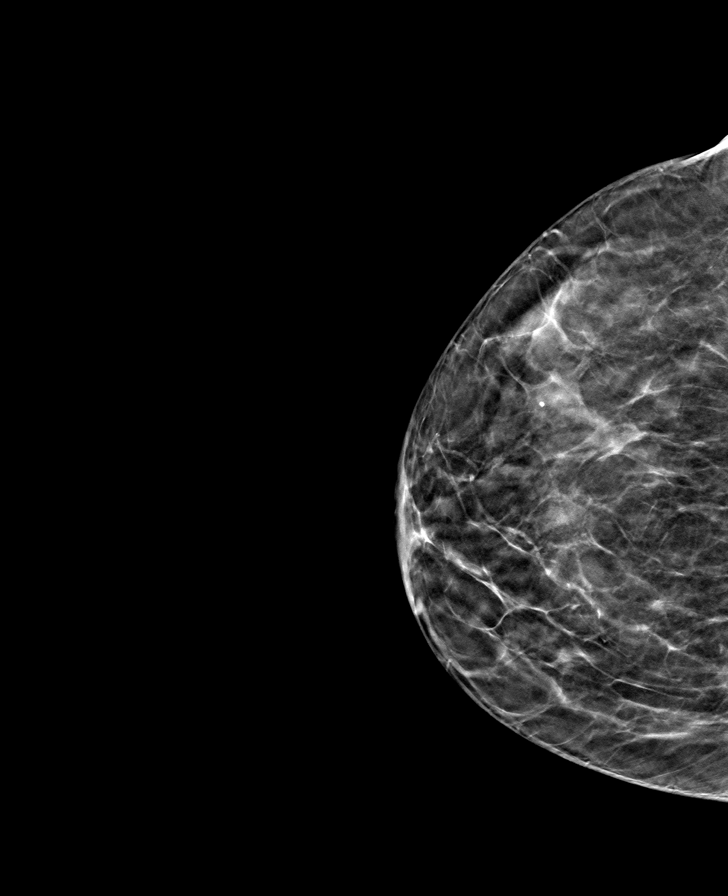

[8 of 24 positions shown; findings below may reference images not displayed]

ACR Breast Density Category c: The breast tissue is heterogeneously
dense, which may obscure small masses.
FINDINGS: There are no findings suspicious for malignancy. The images were
evaluated with computer-aided detection.
IMPRESSION: No mammographic evidence of malignancy. A result letter of this
screening mammogram will be mailed directly to the patient.

RECOMMENDATION:
Screening mammogram in one year. (Code:[id number])

BI-RADS CATEGORY  1: Negative.

## 2022-08-04 ENCOUNTER — Ambulatory Visit: Payer: Medicare Other | Admitting: Nurse Practitioner

## 2022-08-04 ENCOUNTER — Telehealth: Payer: Medicare Other

## 2022-08-04 DIAGNOSIS — N184 Chronic kidney disease, stage 4 (severe): Secondary | ICD-10-CM | POA: Diagnosis not present

## 2022-08-04 DIAGNOSIS — E113393 Type 2 diabetes mellitus with moderate nonproliferative diabetic retinopathy without macular edema, bilateral: Secondary | ICD-10-CM | POA: Diagnosis not present

## 2022-08-04 DIAGNOSIS — Z794 Long term (current) use of insulin: Secondary | ICD-10-CM | POA: Diagnosis not present

## 2022-08-04 DIAGNOSIS — E1121 Type 2 diabetes mellitus with diabetic nephropathy: Secondary | ICD-10-CM | POA: Diagnosis not present

## 2022-08-04 DIAGNOSIS — E1122 Type 2 diabetes mellitus with diabetic chronic kidney disease: Secondary | ICD-10-CM | POA: Diagnosis not present

## 2022-08-05 ENCOUNTER — Encounter: Payer: Self-pay | Admitting: Nurse Practitioner

## 2022-08-05 ENCOUNTER — Ambulatory Visit (INDEPENDENT_AMBULATORY_CARE_PROVIDER_SITE_OTHER): Payer: Medicare Other | Admitting: Nurse Practitioner

## 2022-08-05 VITALS — BP 138/86 | HR 89 | Temp 97.6°F | Ht 67.0 in | Wt 164.9 lb

## 2022-08-05 DIAGNOSIS — N184 Chronic kidney disease, stage 4 (severe): Secondary | ICD-10-CM

## 2022-08-05 DIAGNOSIS — E1140 Type 2 diabetes mellitus with diabetic neuropathy, unspecified: Secondary | ICD-10-CM | POA: Diagnosis not present

## 2022-08-05 DIAGNOSIS — I130 Hypertensive heart and chronic kidney disease with heart failure and stage 1 through stage 4 chronic kidney disease, or unspecified chronic kidney disease: Secondary | ICD-10-CM

## 2022-08-05 DIAGNOSIS — I5032 Chronic diastolic (congestive) heart failure: Secondary | ICD-10-CM | POA: Diagnosis not present

## 2022-08-05 DIAGNOSIS — Z8673 Personal history of transient ischemic attack (TIA), and cerebral infarction without residual deficits: Secondary | ICD-10-CM

## 2022-08-05 DIAGNOSIS — E1165 Type 2 diabetes mellitus with hyperglycemia: Secondary | ICD-10-CM | POA: Diagnosis not present

## 2022-08-05 DIAGNOSIS — G894 Chronic pain syndrome: Secondary | ICD-10-CM

## 2022-08-05 DIAGNOSIS — E785 Hyperlipidemia, unspecified: Secondary | ICD-10-CM | POA: Diagnosis not present

## 2022-08-05 DIAGNOSIS — K449 Diaphragmatic hernia without obstruction or gangrene: Secondary | ICD-10-CM

## 2022-08-05 DIAGNOSIS — E1169 Type 2 diabetes mellitus with other specified complication: Secondary | ICD-10-CM | POA: Diagnosis not present

## 2022-08-05 DIAGNOSIS — N2581 Secondary hyperparathyroidism of renal origin: Secondary | ICD-10-CM | POA: Diagnosis not present

## 2022-08-05 DIAGNOSIS — D631 Anemia in chronic kidney disease: Secondary | ICD-10-CM

## 2022-08-05 DIAGNOSIS — Z794 Long term (current) use of insulin: Secondary | ICD-10-CM | POA: Diagnosis not present

## 2022-08-05 DIAGNOSIS — B0229 Other postherpetic nervous system involvement: Secondary | ICD-10-CM | POA: Diagnosis not present

## 2022-08-05 NOTE — Progress Notes (Signed)
BP 138/86 (BP Location: Left Arm, Patient Position: Sitting)   Pulse 89   Temp 97.6 F (36.4 C) (Oral)   Ht 5' 7" (1.702 m)   Wt 164 lb 14.4 oz (74.8 kg)   LMP  (LMP Unknown)   SpO2 99%   BMI 25.83 kg/m    Subjective:    Patient ID: Tonya [name], female    DOB: 09/09/1946, 76 y.o.   MRN: [id number]  HPI: MARSHELLE BILGER is a 76 y.o. female  Chief Complaint  Patient presents with   Diabetes   Hyperlipidemia   Hypertension   Pain   Congestive Heart Failure   DIABETES Saw Dr. [name] on 08/04/22 with A1c 8.5%, no changes made.    Previously has tried Toujeo and Trulicity -- did not tolerate these.  Currently taking Humalog and Actos -- does not always Humalog, sometimes takes this after lunch.  Freestyle in place = based on recent readings was in target 64%, 3% below target, and 33% above.   Polydipsia/polyuria: no Visual disturbance: no Chest pain: no Paresthesias: no Glucose Monitoring: yes             Accucheck frequency: TID              Fasting glucose:             Post prandial:             Evening:              Before meals:              Taking Insulin?: yes             Long acting insulin: None, endo stopped             Short acting insulin: Humalog 10 units in morning and then 8 units at lunch and dinner Blood Pressure Monitoring: a few times a week Retinal Examination: Up to Date -- goes every 6 weeks Foot Exam: Up to Date Pneumovax: Up to Date Influenza: Up to Date Aspirin: yes    HYPERTENSION / HYPERLIPIDEMIA/HF Sees cardiology, Dr. [name] Situ, with last visit 07/01/22 with no changes made. Continues on Carvedilol, Plavix, Torsemide, Olmesartan, and Crestor. LVEF on 12/02/2019 was 60 to 65% with moderate LVH, and grade 1 diastolic dysfunction.  She is currently going to be scheduled for hiatal hernia repair as this is larger in size and pushing near heart. Satisfied with current treatment? yes Duration of hypertension: chronic BP monitoring frequency: a  few times a week BP range: 112/64 yesterday BP medication side effects: no Duration of hyperlipidemia: chronic Cholesterol medication side effects: no Cholesterol supplements: none Medication compliance: good compliance Aspirin: yes Recent stressors: no Recurrent headaches: no Visual changes: no Palpitations: no Dyspnea: no Chest pain: no Lower extremity edema: at baseline, small amount Dizzy/lightheaded: no   CHRONIC KIDNEY DISEASE Followed by nephrology and last saw 07/15/22.  Hematology for anemia in CKD and last seen 12/27/2019 -- last infusion 08/01/22.  CRT 2.24, eGFR 47, PTH 142. CKD status: stable Medications renally dose: yes Previous renal evaluation: yes Pneumovax:  Up to Date Influenza Vaccine:  Up to Date     Latest Ref Rng & Units 04/15/2022    1:34 PM 02/12/2021    5:06 PM 01/29/2021   11:10 AM  CMP  Glucose 70 - 99 mg/dL 278  204  168   BUN 8 - 23 mg/dL 39  53  57   Creatinine 0.44 -  1.00 mg/dL 2.26  2.45  2.60   Sodium 135 - 145 mmol/L 140  139  145   Potassium 3.5 - 5.1 mmol/L 4.6  4.3  4.8   Chloride 98 - 111 mmol/L 109  109  108   CO2 22 - 32 mmol/L 25  21  17   Calcium 8.9 - 10.3 mg/dL 8.8  8.7  8.9   Total Protein 6.5 - 8.1 g/dL 6.3  6.6    Total Bilirubin 0.3 - 1.2 mg/dL 0.6  0.6    Alkaline Phos 38 - 126 U/L 57  62    AST 15 - 41 U/L 21  21    ALT 0 - 44 U/L 12  12      POSTHERPETIC NEURALGIA: Pain management and last seen by Dr. Lateef on 09/02/21 -- received injection at that time.  Consistent neuropathy discomfort to toes and feet + back -- will ease pain, but then it returns.  History shingles years ago and since that time has had ongoing issues with pain to lower right back.  She is intolerant to Gabapentin and had issues with Lyrica (change in mental status and hospital admission) + has tried Lidocaine patches no benefit.  She currently takes Tylenol, which helps her sleep some.    Imaging to lumbar spine in 2016, where her pain is, and it showed  "diffuse multilevel degenerative changes lumbar spine". Was on Duloxetine, but stopped as not offering benefit.  Relevant past medical, surgical, family and social history reviewed and updated as indicated. Interim medical history since our last visit reviewed. Allergies and medications reviewed and updated.  Review of Systems  Constitutional:  Negative for activity change, appetite change, diaphoresis, fatigue and fever.  Respiratory:  Negative for cough, chest tightness, shortness of breath and wheezing.   Cardiovascular:  Negative for chest pain, palpitations and leg swelling.  Gastrointestinal: Negative.   Neurological: Negative.   Psychiatric/Behavioral: Negative.      Per HPI unless specifically indicated above     Objective:    BP 138/86 (BP Location: Left Arm, Patient Position: Sitting)   Pulse 89   Temp 97.6 F (36.4 C) (Oral)   Ht 5' 7" (1.702 m)   Wt 164 lb 14.4 oz (74.8 kg)   LMP  (LMP Unknown)   SpO2 99%   BMI 25.83 kg/m   Wt Readings from Last 3 Encounters:  08/05/22 164 lb 14.4 oz (74.8 kg)  07/28/22 166 lb 3.2 oz (75.4 kg)  07/23/22 166 lb (75.3 kg)    Physical Exam Vitals and nursing note reviewed.  Constitutional:      General: She is awake. She is not in acute distress.    Appearance: She is well-developed. She is not ill-appearing.  HENT:     Head: Normocephalic.     Right Ear: Hearing normal.     Left Ear: Hearing normal.  Eyes:     General: Lids are normal.        Right eye: No discharge.        Left eye: No discharge.     Conjunctiva/sclera: Conjunctivae normal.     Pupils: Pupils are equal, round, and reactive to light.  Neck:     Thyroid: No thyromegaly.     Vascular: No carotid bruit.  Cardiovascular:     Rate and Rhythm: Normal rate and regular rhythm.     Heart sounds: Normal heart sounds. No murmur heard.    No gallop.  Pulmonary:       Effort: Pulmonary effort is normal. No accessory muscle usage or respiratory distress.     Breath  sounds: Normal breath sounds.  Abdominal:     General: Bowel sounds are normal.     Palpations: Abdomen is soft.  Musculoskeletal:     Cervical back: Normal range of motion and neck supple.     Right lower leg: Edema (trace) present.     Left lower leg: Edema (trace) present.  Skin:    General: Skin is warm and dry.     Findings: No rash.  Neurological:     Mental Status: She is alert and oriented to person, place, and time.  Psychiatric:        Attention and Perception: Attention normal.        Mood and Affect: Mood normal.        Behavior: Behavior normal. Behavior is cooperative.        Thought Content: Thought content normal.        Judgment: Judgment normal.    Diabetic Foot Exam - Simple   Simple Foot Form Visual Inspection No deformities, no ulcerations, no other skin breakdown bilaterally: Yes Sensation Testing Intact to touch and monofilament testing bilaterally: Yes Pulse Check Posterior Tibialis and Dorsalis pulse intact bilaterally: Yes Comments     Results for orders placed or performed in visit on 08/01/22  CBC with Differential/Platelet  Result Value Ref Range   WBC 6.2 4.0 - 10.5 K/uL   RBC 3.44 (L) 3.87 - 5.11 MIL/uL   Hemoglobin 9.4 (L) 12.0 - 15.0 g/dL   HCT 30.9 (L) 36.0 - 46.0 %   MCV 89.8 80.0 - 100.0 fL   MCH 27.3 26.0 - 34.0 pg   MCHC 30.4 30.0 - 36.0 g/dL   RDW 15.2 11.5 - 15.5 %   Platelets 245 150 - 400 K/uL   nRBC 0.3 (H) 0.0 - 0.2 %   Neutrophils Relative % 62 %   Neutro Abs 3.8 1.7 - 7.7 K/uL   Lymphocytes Relative 26 %   Lymphs Abs 1.6 0.7 - 4.0 K/uL   Monocytes Relative 7 %   Monocytes Absolute 0.5 0.1 - 1.0 K/uL   Eosinophils Relative 3 %   Eosinophils Absolute 0.2 0.0 - 0.5 K/uL   Basophils Relative 1 %   Basophils Absolute 0.0 0.0 - 0.1 K/uL   Immature Granulocytes 1 %   Abs Immature Granulocytes 0.05 0.00 - 0.07 K/uL      Assessment & Plan:   Problem List Items Addressed This Visit       Cardiovascular and Mediastinum    Chronic heart failure with preserved ejection fraction (HFpEF) (HCC)    Chronic, ongoing with EF 60-65%, euvolemic at this time.  Continue current medication regimen and collaboration with cardiology + CCM team in office.  - Reminded to call for an overnight weight gain of >2 pounds or a weekly weight weight of >5 pounds -- work with CCM team on obtaining scale. - not adding salt to his food and has been reading food labels. 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 BP at goal today in office on recheck and on home readings.  Continue current medication regimen and collaboration with   nephrology.  Labs TSH today and reviewed in Care Everywhere recent labs.  Recommend she continue to monitor BP at home daily and document for providers + focus on DASH diet.         Relevant Orders   TSH     Respiratory   Hiatal hernia    Followed by general surgery at this time, continue this collaboration.  Mag level today as taking daily PPI.        Endocrine   Hyperlipidemia associated with type 2 diabetes mellitus (HCC)    Chronic, ongoing.  Continue Rosuvastatin. Continue to collaborate with [facility].   Lipid panel today.      Relevant Orders   Lipid Panel w/o Chol/HDL Ratio   Hyperparathyroidism due to renal insufficiency (HCC)    Chronic, ongoing, followed by nephrology with recent labs obtained.  Continue collaboration with nephrology.   Recent note and labs reviewed.      Relevant Orders   VITAMIN D 25 Hydroxy (Vit-D Deficiency, Fractures)   Poorly controlled type 2 diabetes mellitus with neuropathy ([place]) - Primary    Chronic, ongoing, followed by endocrinology.  A1c 8.5% recently and urine ALB up to date with nephrology. Continue current  medication regimen and defer changes to endo.  Cardiology and PCP have recommended to endo discontinuing Actos due to her HF.  Continue to collaborate with Dr. [name] and CCM team. Recommend she monitor BS at least 3 times a day at home, continue consistent [place] use.  Return in 6 months as is being followed closely by endo.        Nervous and Auditory   Post herpetic neuralgia    Chronic with poor tolerance to Gabapentin and Lyrica and minimal relief with simple treatment methods.  Currently followed by pain management, continue this collaboration.  Recommend she return to see them.      Relevant Orders   Magnesium     Genitourinary   Anemia in stage 4 chronic kidney disease (HCC)    Chronic, ongoing.  Continue collaboration with hematology and infusions.      Chronic kidney disease, stage 4, severely decreased GFR (HCC)    Chronic, ongoing.  Continue current medication regimen and collaboration with nephrology. Labs up to date and reviewed in Care Everywhere.         Other   Chronic pain syndrome    Chronic, ongoing, followed by pain management, continue this collaboration.  Recent notes reviewed.  Recommend she return for visit.      Encounter for long-term (current) use of insulin (HCC)    Chronic, ongoing.  Monitor closely due to AM low BS.  Continue collaboration with endocrinology.        Follow up plan: Return in about 6 months (around 02/05/2023) for T2DM, HTN/HLD, GERD, CKD, PAIN, HF.

## 2022-08-05 NOTE — Assessment & Plan Note (Addendum)
Chronic, ongoing. BP at goal today in office on recheck and on home readings.  Continue current medication regimen and collaboration with nephrology.  Labs TSH today and reviewed in Care Everywhere recent labs.  Recommend she continue to monitor BP at home daily and document for providers + focus on DASH diet.

## 2022-08-05 NOTE — Assessment & Plan Note (Signed)
Chronic, ongoing.  Monitor closely due to AM low BS.  Continue collaboration with endocrinology. 

## 2022-08-05 NOTE — Assessment & Plan Note (Signed)
Chronic, ongoing, followed by pain management, continue this collaboration.  Recent notes reviewed.  Recommend she return for visit.

## 2022-08-05 NOTE — Assessment & Plan Note (Signed)
Chronic, ongoing.  Continue collaboration with hematology and infusions.   

## 2022-08-05 NOTE — Assessment & Plan Note (Signed)
Followed by general surgery at this time, continue this collaboration.  Mag level today as taking daily PPI.

## 2022-08-05 NOTE — Assessment & Plan Note (Signed)
Chronic, ongoing.  Continue Rosuvastatin. Continue to collaborate with [facility].   Lipid panel today. 

## 2022-08-05 NOTE — Assessment & Plan Note (Addendum)
Chronic with poor tolerance to Gabapentin and Lyrica and minimal relief with simple treatment methods.  Currently followed by pain management, continue this collaboration.  Recommend she return to see them.

## 2022-08-05 NOTE — Assessment & Plan Note (Addendum)
Chronic, ongoing with EF 60-65%, euvolemic at this time.  Continue current medication regimen and collaboration with cardiology + CCM team in office.  - Reminded to call for an overnight weight gain of >2 pounds or a weekly weight weight of >5 pounds -- work with CCM team on obtaining scale. - not adding salt to his food and has been reading food labels. Reviewed the importance of keeping daily sodium intake to <2000mg daily  - Wear compression hose daily -- not on today and not consistently wearing - No Ibuprofen at home 

## 2022-08-05 NOTE — Assessment & Plan Note (Signed)
Chronic, ongoing.  Continue current medication regimen and collaboration with nephrology. Labs up to date and reviewed in Care Everywhere.  

## 2022-08-05 NOTE — Assessment & Plan Note (Signed)
Chronic, ongoing, followed by nephrology with recent labs obtained.  Continue collaboration with nephrology.   Recent note and labs reviewed. 

## 2022-08-05 NOTE — Assessment & Plan Note (Signed)
Chronic, ongoing, followed by endocrinology.  A1c 8.5% recently and urine ALB up to date with nephrology. Continue current medication regimen and defer changes to endo.  Cardiology and PCP have recommended to endo discontinuing Actos due to her HF.  Continue to collaborate with Dr. [name] and CCM team. Recommend she monitor BS at least 3 times a day at home, continue consistent [place] use.  Return in 6 months as is being followed closely by endo.

## 2022-08-06 LAB — LIPID PANEL W/O CHOL/HDL RATIO
Cholesterol, Total: 177 mg/dL (ref 100–199)
HDL: 76 mg/dL (ref >39)
LDL Chol Calc (NIH): 83 mg/dL (ref 0–99)
Triglycerides: 101 mg/dL (ref 0–149)
VLDL Cholesterol Cal: 18 mg/dL (ref 5–40)

## 2022-08-06 LAB — MAGNESIUM: Magnesium: 2.1 mg/dL (ref 1.6–2.3)

## 2022-08-06 LAB — VITAMIN D 25 HYDROXY (VIT D DEFICIENCY, FRACTURES): Vit D, 25-Hydroxy: 24.4 ng/mL — ABNORMAL LOW (ref 30.0–100.0)

## 2022-08-06 LAB — TSH: TSH: 2.04 u[IU]/mL (ref 0.450–4.500)

## 2022-08-06 NOTE — Progress Notes (Signed)
Contacted via [place] evening [place], your labs have returned and overall remain stable with exception of Vitamin D which remains a little on low side.  I recommend taking Vitamin D3 2000 units daily for bone health.  Any questions? Keep being amazing!!  Thank you for allowing me to participate in your care.  I appreciate you. Kindest regards, Thinh Cuccaro

## 2022-08-07 ENCOUNTER — Encounter: Payer: Self-pay | Admitting: Oncology

## 2022-08-11 ENCOUNTER — Ambulatory Visit: Payer: Medicare Other | Admitting: Surgery

## 2022-08-11 ENCOUNTER — Other Ambulatory Visit: Payer: Self-pay

## 2022-08-11 ENCOUNTER — Encounter: Payer: Self-pay | Admitting: Surgery

## 2022-08-11 VITALS — BP 172/97 | HR 85 | Temp 98.4°F | Ht 67.0 in | Wt 162.4 lb

## 2022-08-11 DIAGNOSIS — K449 Diaphragmatic hernia without obstruction or gangrene: Secondary | ICD-10-CM | POA: Diagnosis not present

## 2022-08-11 NOTE — Patient Instructions (Addendum)
Stop the Plavix 08/19/2022.    Our surgery scheduler will call you within 24-48 hours to schedule your surgery. Please have the [place] surgery sheet available when speaking with her.     Eating Plan After Nissen Fundoplication After a Nissen fundoplication procedure, it is common to have some difficulty swallowing. The part of your body that moves food and liquid from your mouth to your stomach (esophagus) will be swollen and may feel tight. It will take several weeks or months for your esophagus and stomach to heal. By following a special eating plan, you can prevent problems such as pain, swelling or pressure in the abdomen (bloating), gas, nausea, or diarrhea. What are tips for following this plan? Cooking Cook all foods until they are soft. Remove skins and seeds from fruits and vegetables before eating. Remove skin and gristle from meats. Grind or finely mince meats before eating. Avoid over-cooking meat. Dry, tough meat is more difficult to swallow. Avoid using oil when cooking, or use only a small amount of oil. Avoid using seasoning when cooking, or use only a small amount of seasoning. Toast bread before eating. This makes it easier to swallow. Meal planning  Eat 6-8 small meals throughout the day. Right after the surgery, have a few meals that are only clear liquids. Clear liquids include: Water. Clear fruit juice, no pulp. Chicken, beef, or vegetable broth. Gelatin. Decaffeinated tea or coffee without milk. Popsicles or shaved ice. Depending on your progress, you may move to a full liquid diet as told by your health care provider. This includes clear liquids and the following: Dairy and alternative milks, such as soy milk. Strained creamed soups. Ice cream or sherbet. Pudding. Nutritional supplement drinks. Yogurt. A few days after surgery, you may be able to start eating a diet of soft foods. You may need to eat according to this plan for several weeks. Do not eat sweets  or sweetened drinks at the beginning of a meal. Doing that may cause your stomach to empty faster than it should (dumping syndrome). Lifestyle Always sit upright when eating or drinking. Eat slowly. Take small bites and chew food well before swallowing. Do not lie down after eating. Stay sitting up for 30 minutes or longer after each meal. Sip fluids between meals. Limit how much you drink at one time. With meals and snacks, have 4-8 oz (120-240 mL). This is equal to  cup-1 cup. Do not mix solid foods and liquids in the same mouthful. Drink enough fluid to keep your urine pale yellow. Do not chew gum or drink fluids through a straw. Doing those things may cause you to swallow extra air. General information Do not drink carbonated drinks or alcohol. Avoid foods and drinks that contain caffeine and chocolate. Avoid foods and drinks that contain citrus or tomato. Allow hot soups and drinks to cool before eating. Avoid foods that cause gas, such as beans, peas, broccoli, or cabbage. If dairy milk products cause diarrhea, avoid them or eat them in small amounts. Recommended foods Fruits Any soft-cooked fruits after skins and seeds are removed. Fruit juice. Vegetables Any soft-cooked vegetables after skins and seeds are removed. Vegetable juice. Grains Cooked cereals. Dry cereals softened with liquid. Cooked pasta, rice, or other grains. Toasted bread. Bland crackers, such as soda or graham crackers. Meats and other protein foods Tender cuts of meat, poultry, or fish after bones, skin, and gristle are removed. Poached, boiled, or scrambled eggs. Canned fish. Tofu. Creamy nut butters. Dairy Milk. Yogurt.  Cottage cheese. Mild cheeses. Beverages Nutritional supplement drinks. Decaffeinated tea or coffee. Sports drinks. Fats and oils Butter. Margarine. Mayonnaise. Vegetable oil. Smooth salad dressing. Sweets and desserts Plain hard candy. Marshmallows. Pudding. Ice cream. Gelatin.  Sherbet. Seasoning and other foods Salt. Light seasonings. Mustard. Vinegar. The items listed above may not be a complete list of recommended foods and beverages. Contact a dietitian for more information. Foods to avoid Fruits Oranges. Grapefruit. Lemons. Limes. Citrus juices. Dried fruit. Crunchy, raw fruits. Vegetables Tomato sauce. Tomato juice. Broccoli. Cauliflower. Cabbage. Brussels sprouts. Crunchy, raw vegetables. Grains High-fiber or bran cereal. Cereal with nuts, dried fruit, or coconut. Sweet breads, rolls, coffee cake, or donuts. Chewy or crusty breads. Popcorn. Meats and other protein foods Beans, peas, and lentils. Tough or fatty meats. Fried meats, chicken, or fish. Fried eggs. Nuts and seeds. Crunchy nut butters. Dairy Chocolate milk. Yogurt with chunks of fruit, nuts, seeds, or coconut. Strong cheeses. Beverages Carbonated soft drinks. Alcohol. Cocoa. Hot drinks. Fats and oils Bacon fat. Lard. Sweets and desserts Chocolate. Candy with nuts, coconut, or seeds. Peppermint. Cookies. Cakes. Pie crust. Seasoning and other foods Heavy seasonings. Chili sauce. Ketchup. Barbecue sauce. Angie Fava. Horseradish. The items listed above may not be a complete list of foods and beverages to avoid. Contact a dietitian for more information. Summary Following this eating plan after a Nissen fundoplication is an important part of healing after surgery. After surgery, you will start with a clear liquid diet before you progress to full liquids and soft foods. You may need to eat soft foods for several weeks. Avoid eating foods that cause irritation, gas, nausea, diarrhea, or swelling or pressure in the abdomen (bloating), and avoid foods that are difficult to swallow. Talk with a dietitian about which dietary choices are best for you. This information is not intended to replace advice given to you by your health care provider. Make sure you discuss any questions you have with your health care  provider. Document Revised: 06/17/2020 Document Reviewed: 06/17/2020 Elsevier Patient Education  [place]. Laparoscopic Nissen Fundoplication  Laparoscopic Nissen fundoplication is a surgery to relieve heartburn and other problems caused by fluid from your stomach (gastric fluids) flowing up into your esophagus. The esophagus is the part of the body that moves food from the mouth to the stomach. Normally, the muscle that sits between the stomach and the esophagus (lower esophageal sphincter, LES) keeps stomach fluids in the stomach. In some people, the LES does not work properly, and stomach fluids flow up into the esophagus (reflux). This can happen when part of the stomach bulges through the LES (hiatal hernia). The backward flow of stomach fluids can cause a type of severe and long-lasting heartburn that is called gastroesophageal reflux disease (GERD). You may need this surgery if other treatments for GERD have not helped. In this procedure, the upper part of your stomach is wrapped around the lower end of your esophagus and stitched together (sutured). This tightens the connection between your esophagus and stomach to prevent stomach acid reflux. Tell a health care provider about: Any allergies you have. All medicines you are taking, including vitamins, herbs, eye drops, creams, and over-the-counter medicines. Any problems you or family members have had with anesthetic medicines. Any blood disorders you have. Any surgeries you have had. Any medical conditions you have. Whether you are pregnant or may be pregnant. What are the risks? Generally, this is a safe procedure. However, problems may occur, including: Infection. Bleeding. Damage to other structures or  organs. This can include damage to the lung, causing a collapsed lung. Trouble swallowing (dysphagia). Blood clots. Allergic reactions to medicines. What happens before the procedure? Staying hydrated Follow instructions  from your health care provider about hydration, which may include: Up to two hours before the procedure - you may continue to drink clear liquids, such as water, clear fruit juice, black coffee and plain tea. Eating and drinking restrictions Follow instructions from your health care provider about eating and drinking, which may include: 8 hours before the procedure - stop eating heavy meals or foods, such as meat, fried foods, or fatty foods. 6 hours before the procedure - stop eating light meals or foods, such as toast or cereal. 6 hours before the procedure - stop drinking milk or drinks that contain milk. 2 hours before the procedure - stop drinking clear liquids. Medicines Ask your health care provider about: Changing or stopping your regular medicines. This is especially important if you are taking diabetes medicines or blood thinners. Taking medicines such as aspirin and ibuprofen. These medicines can thin your blood. Do not take these medicines unless your health care provider tells you to take them. Taking over-the-counter medicines, vitamins, herbs, and supplements. Tests Your health care provider will do tests to plan the procedure. This may include: An exam using a flexible scope passed down your esophagus into your stomach (endoscopy). Imaging studies. General instructions Plan to have a responsible adult take you home from the hospital or clinic. Ask your health care provider: How your surgery site will be marked. What steps will be taken to help prevent infection. These steps may include: Removing hair at the surgery site. Washing skin with a germ-killing soap. Taking antibiotic medicine. Do not use any products that contain nicotine or tobacco for at least 4 weeks before the procedure. These products include cigarettes, chewing tobacco, and vaping devices, such as e-cigarettes. If you need help quitting, ask your health care provider. What happens during the procedure? An IV  will be inserted into one of your veins. You will be given medicine in your IV to help you relax (sedative) just before the procedure and a medicine to make you fall asleep (general anesthetic). You may have a tube placed through your nose into your stomach to drain stomach acid during the procedure (nasogastric tube). The surgeon will make a small incision in your abdomen and insert a tube through the incision. Your abdomen will be filled with a gas. This helps the surgeon see your organs better, and it makes more space to work. The surgeon will insert a thin, lighted tube (laparoscope) through the small incision. This allows your surgeon to see into your abdomen. The surgeon will make several other small incisions in your abdomen to insert the other instruments that are needed during the procedure. Another instrument (dilator) will be passed through your mouth and down your esophagus into the upper part of your stomach. The dilator will prevent your LES from being closed too tightly during surgery. The upper part of your stomach will be wrapped around the lower part of your esophagus and will be stitched into place. This will strengthen the lower esophageal sphincter and prevent reflux. If you have a hiatal hernia, it will be repaired. The gas will be released from your abdomen. All instruments will be removed, and the incisions will be closed with stitches (sutures). A bandage (dressing) will be placed on your skin over the incisions. The procedure may vary among health care providers and  hospitals. What happens after the procedure? Your blood pressure, heart rate, breathing rate, and blood oxygen level will be monitored until you leave the hospital or clinic. You will be given pain medicine as needed. Your IV will be kept in until you are able to drink fluids. You will be encouraged to get up and walk around as soon as possible. Summary Laparoscopic Nissen fundoplication is a surgery to  relieve heartburn and other problems caused by gastric fluids flowing up into your esophagus. You may need this surgery if other treatments for GERD have not helped. Follow instructions from your health care provider about eating and drinking before the procedure. Your surgeon will use a thin, lighted tube (laparoscope) that is inserted through a small incision, allowing the surgeon to see into your abdomen. This information is not intended to replace advice given to you by your health care provider. Make sure you discuss any questions you have with your health care provider. Document Revised: 06/17/2020 Document Reviewed: 06/17/2020 Elsevier Patient Education  [place].

## 2022-08-12 ENCOUNTER — Telehealth: Payer: Self-pay | Admitting: Surgery

## 2022-08-12 NOTE — Telephone Encounter (Signed)
Outgoing call, left message for patient to call. Please inform her of the following regarding scheduled surgery.   Pre-Admission date/time, and Surgery date.  Surgery Date: 08/26/22 Preadmission Testing Date: 08/15/22 (phone 1p-5p)  Also patient will need to call at [phone], between 1-3:00pm the day before surgery, to find out what time to arrive for surgery.

## 2022-08-12 NOTE — Telephone Encounter (Signed)
Patient calls back, she is informed of all dates regarding her surgery and verbalized understanding. 

## 2022-08-13 ENCOUNTER — Encounter: Payer: Self-pay | Admitting: Surgery

## 2022-08-13 NOTE — H&P (View-Only) (Signed)
Outpatient Surgical Follow Up  08/13/2022  Tonya [name] is an 76 y.o. female.   Chief Complaint  Patient presents with   Follow-up    Hiatal hernia     HPI:  Tonya [name] is a 76 y.o. female following up for a paraesophageal hernia.  She does have a history of some chronic pulmonary issues.  She does have a history of chronic kidney disease a stage III, diabetes, hypertension and a prior stroke 5 years ago or so.  She has a preserved ejection fraction.  She also reports significant reflux that is only partially controlled with PPI.  More recently having cough and then having some issues with her lungs. She already had an EGD by Dr. Vicente Males that I have personally reviewed showing evidence of a large paraesophageal hernia.  There was no evidence of a strictures. She also had a CT scan that I have personally reviewed showing evidence of paraesophageal hernia type III.  There is about a half of the stomach within the mediastinum. There was solid shows no evidence of achalasia or strictures Cardiology thinks she is optimized She is currently on Plavix and aspirin for stroke She walks w a cane. SHe is here with her husband , daughter and son. Initially they were videotaping but I expressed my discomfort with this and voiced my concerns w HIPPA and privacy issues.  Family understood  Past Medical History:  Diagnosis Date   (HFpEF) heart failure with preserved ejection fraction ([place])    a. 05/2017 Echo: EF 55-60%, Gr1 DD, mildly dil LA.   CKD (chronic kidney disease), stage III ([place])    Diabetes mellitus without complication ([place])    Hyperlipidemia    Hypertension    Stroke ([place])    a. 08/2017.   Wears dentures    partial upper    Past Surgical History:  Procedure Laterality Date   CATARACT EXTRACTION W/PHACO Left 02/05/2021   Procedure: CATARACT EXTRACTION PHACO AND INTRAOCULAR LENS PLACEMENT (IOC) LEFT DIABETIC 5.47 00:51.4;  Surgeon: Birder Robson, MD;  Location: [facility];  Service: Ophthalmology;  Laterality: Left;  Diabetic - insulin and oral meds   CATARACT EXTRACTION W/PHACO Right 03/19/2021   Procedure: CATARACT EXTRACTION PHACO AND INTRAOCULAR LENS PLACEMENT (IOC) RIGHT DIABETIC 6.09 00:39.6;  Surgeon: Birder Robson, MD;  Location: [facility];  Service: Ophthalmology;  Laterality: Right;   COLONOSCOPY  12/15/2006   COLONOSCOPY WITH PROPOFOL N/A 06/06/2019   Procedure: COLONOSCOPY WITH PROPOFOL;  Surgeon: [name], MD;  Location: [facility] ENDOSCOPY;  Service: Gastroenterology;  Laterality: N/A;   COLONOSCOPY WITH PROPOFOL N/A 01/16/2020   Procedure: COLONOSCOPY WITH BIOPSY;  Surgeon: [name], MD;  Location: [facility];  Service: Endoscopy;  Laterality: N/A;  Diabetic - insulin and oral meds   ESOPHAGOGASTRODUODENOSCOPY (EGD) WITH PROPOFOL N/A 06/23/2022   Procedure: ESOPHAGOGASTRODUODENOSCOPY (EGD) WITH PROPOFOL;  Surgeon: [name], MD;  Location: [facility] ENDOSCOPY;  Service: Gastroenterology;  Laterality: N/A;   EYE SURGERY     POLYPECTOMY N/A 01/16/2020   Procedure: POLYPECTOMY;  Surgeon: [name], MD;  Location: [facility];  Service: Endoscopy;  Laterality: N/A;   TUBAL LIGATION      Family History  Problem Relation Age of Onset   Diabetes Mother    Stroke Mother    Hyperlipidemia Father    Hypertension Father    Diabetes Sister    Diabetes Brother    Gout Son    Diabetes Brother    Kidney disease Son  Lymphoma Sister    Heart attack Sister    Breast cancer Neg Hx     Social History:  reports that she has never smoked. She has never used smokeless tobacco. She reports that she does not drink alcohol and does not use drugs.  Allergies:  Allergies  Allergen Reactions   Atorvastatin     Myalgias    Gabapentin Other (See Comments)    Speech impairment    Pravastatin     Myalgias    Pregabalin     Speech impairment   Trulicity [Dulaglutide] Hives    Medications reviewed.    ROS Full  ROS performed and is otherwise negative other than what is stated in HPI   BP (!) 172/97   Pulse 85   Temp 98.4 F (36.9 C) (Oral)   Ht '5\' 7"'$  (1.702 m)   Wt 162 lb 6.4 oz (73.7 kg)   LMP  (LMP Unknown)   SpO2 99%   BMI 25.44 kg/m   Physical Exam CONSTITUTIONAL: NAD. EYES: Pupils are equal, round, Sclera are non-icteric. EARS, NOSE, MOUTH AND THROAT: The oral mucosa is pink and moist. Hearing is intact to voice. LYMPH NODES:  Lymph nodes in the neck are normal. RESPIRATORY:  Lungs are clear. There is normal respiratory effort, with equal breath sounds bilaterally, and without pathologic use of accessory muscles. CARDIOVASCULAR: Heart is regular without murmurs, gallops, or rubs. GI: The abdomen is  soft, nontender, and nondistended. There are no palpable masses. There is no hepatosplenomegaly. There are normal bowel sounds in all quadrants. GU: Rectal deferred.   MUSCULOSKELETAL: Normal muscle strength and tone. No cyanosis or edema.   SKIN: Turgor is good and there are no pathologic skin lesions or ulcers. NEUROLOGIC: Motor and sensation is grossly normal. Cranial nerves are grossly intact. PSYCH:  Oriented to person, place and time. Affect is normal.  Assessment/Plan: 76 year old female with symptomatic paraesophageal hernia type III significant reflux symptoms and pulmonary issues.  Discussed with the patient and famaily  in detail about her disease process and the role for paraesophageal hernia repair.  I do think she will be a good candidate for repair.  I did showed them the images studies as well as cartoons related to pt's pathology.  She has been optimized from a cardiac perspective.  We Talked regarding robotic repair.  Risk, benefits and possible implications include but not limited to: Bleeding, infection bowel and esophageal injuries, bloating, chronic pain.  Interested in moving forward and she wishes to proceed w robotic repair .   Please note that I spent 40 minutes  in this encounter including personally reviewing imaging studies, coordinating her care, counseling the patient, placing orders and performing appropriate documentation  Caroleen Hamman, MD [place] Surgeon

## 2022-08-13 NOTE — Progress Notes (Signed)
Outpatient Surgical Follow Up  08/13/2022  Tonya [name] is an 76 y.o. female.   Chief Complaint  Patient presents with   Follow-up    Hiatal hernia     HPI:  Tonya [name] is a 76 y.o. female following up for a paraesophageal hernia.  She does have a history of some chronic pulmonary issues.  She does have a history of chronic kidney disease a stage III, diabetes, hypertension and a prior stroke 5 years ago or so.  She has a preserved ejection fraction.  She also reports significant reflux that is only partially controlled with PPI.  More recently having cough and then having some issues with her lungs. She already had an EGD by Dr. Vicente Males that I have personally reviewed showing evidence of a large paraesophageal hernia.  There was no evidence of a strictures. She also had a CT scan that I have personally reviewed showing evidence of paraesophageal hernia type III.  There is about a half of the stomach within the mediastinum. There was solid shows no evidence of achalasia or strictures Cardiology thinks she is optimized She is currently on Plavix and aspirin for stroke She walks w a cane. SHe is here with her husband , daughter and son. Initially they were videotaping but I expressed my discomfort with this and voiced my concerns w HIPPA and privacy issues.  Family understood  Past Medical History:  Diagnosis Date   (HFpEF) heart failure with preserved ejection fraction ([place])    a. 05/2017 Echo: EF 55-60%, Gr1 DD, mildly dil LA.   CKD (chronic kidney disease), stage III ([place])    Diabetes mellitus without complication ([place])    Hyperlipidemia    Hypertension    Stroke ([place])    a. 08/2017.   Wears dentures    partial upper    Past Surgical History:  Procedure Laterality Date   CATARACT EXTRACTION W/PHACO Left 02/05/2021   Procedure: CATARACT EXTRACTION PHACO AND INTRAOCULAR LENS PLACEMENT (IOC) LEFT DIABETIC 5.47 00:51.4;  Surgeon: Birder Robson, MD;  Location: [facility];  Service: Ophthalmology;  Laterality: Left;  Diabetic - insulin and oral meds   CATARACT EXTRACTION W/PHACO Right 03/19/2021   Procedure: CATARACT EXTRACTION PHACO AND INTRAOCULAR LENS PLACEMENT (IOC) RIGHT DIABETIC 6.09 00:39.6;  Surgeon: Birder Robson, MD;  Location: [facility];  Service: Ophthalmology;  Laterality: Right;   COLONOSCOPY  12/15/2006   COLONOSCOPY WITH PROPOFOL N/A 06/06/2019   Procedure: COLONOSCOPY WITH PROPOFOL;  Surgeon: [name], MD;  Location: [facility] ENDOSCOPY;  Service: Gastroenterology;  Laterality: N/A;   COLONOSCOPY WITH PROPOFOL N/A 01/16/2020   Procedure: COLONOSCOPY WITH BIOPSY;  Surgeon: [name], MD;  Location: [facility];  Service: Endoscopy;  Laterality: N/A;  Diabetic - insulin and oral meds   ESOPHAGOGASTRODUODENOSCOPY (EGD) WITH PROPOFOL N/A 06/23/2022   Procedure: ESOPHAGOGASTRODUODENOSCOPY (EGD) WITH PROPOFOL;  Surgeon: [name], MD;  Location: [facility] ENDOSCOPY;  Service: Gastroenterology;  Laterality: N/A;   EYE SURGERY     POLYPECTOMY N/A 01/16/2020   Procedure: POLYPECTOMY;  Surgeon: [name], MD;  Location: [place];  Service: Endoscopy;  Laterality: N/A;   TUBAL LIGATION      Family History  Problem Relation Age of Onset   Diabetes Mother    Stroke Mother    Hyperlipidemia Father    Hypertension Father    Diabetes Sister    Diabetes Brother    Gout Son    Diabetes Brother    Kidney disease Son  Lymphoma Sister    Heart attack Sister    Breast cancer Neg Hx     Social History:  reports that she has never smoked. She has never used smokeless tobacco. She reports that she does not drink alcohol and does not use drugs.  Allergies:  Allergies  Allergen Reactions   Atorvastatin     Myalgias    Gabapentin Other (See Comments)    Speech impairment    Pravastatin     Myalgias    Pregabalin     Speech impairment   Trulicity [Dulaglutide] Hives    Medications reviewed.    ROS Full  ROS performed and is otherwise negative other than what is stated in HPI   BP (!) 172/97   Pulse 85   Temp 98.4 F (36.9 C) (Oral)   Ht '5\' 7"'$  (1.702 m)   Wt 162 lb 6.4 oz (73.7 kg)   LMP  (LMP Unknown)   SpO2 99%   BMI 25.44 kg/m   Physical Exam CONSTITUTIONAL: NAD. EYES: Pupils are equal, round, Sclera are non-icteric. EARS, NOSE, MOUTH AND THROAT: The oral mucosa is pink and moist. Hearing is intact to voice. LYMPH NODES:  Lymph nodes in the neck are normal. RESPIRATORY:  Lungs are clear. There is normal respiratory effort, with equal breath sounds bilaterally, and without pathologic use of accessory muscles. CARDIOVASCULAR: Heart is regular without murmurs, gallops, or rubs. GI: The abdomen is  soft, nontender, and nondistended. There are no palpable masses. There is no hepatosplenomegaly. There are normal bowel sounds in all quadrants. GU: Rectal deferred.   MUSCULOSKELETAL: Normal muscle strength and tone. No cyanosis or edema.   SKIN: Turgor is good and there are no pathologic skin lesions or ulcers. NEUROLOGIC: Motor and sensation is grossly normal. Cranial nerves are grossly intact. PSYCH:  Oriented to person, place and time. Affect is normal.  Assessment/Plan: 76 year old female with symptomatic paraesophageal hernia type III significant reflux symptoms and pulmonary issues.  Discussed with the patient and famaily  in detail about her disease process and the role for paraesophageal hernia repair.  I do think she will be a good candidate for repair.  I did showed them the images studies as well as cartoons related to pt's pathology.  She has been optimized from a cardiac perspective.  We Talked regarding robotic repair.  Risk, benefits and possible implications include but not limited to: Bleeding, infection bowel and esophageal injuries, bloating, chronic pain.  Interested in moving forward and she wishes to proceed w robotic repair .   Please note that I spent 40 minutes  in this encounter including personally reviewing imaging studies, coordinating her care, counseling the patient, placing orders and performing appropriate documentation  Caroleen Hamman, MD [facility]

## 2022-08-15 ENCOUNTER — Encounter
Admission: RE | Admit: 2022-08-15 | Discharge: 2022-08-15 | Disposition: A | Discharge status: Home / Self Care | Payer: Medicare Other | Source: Ambulatory Visit | Attending: Surgery | Admitting: Surgery

## 2022-08-15 ENCOUNTER — Encounter: Payer: Self-pay | Admitting: Urgent Care

## 2022-08-15 DIAGNOSIS — Z01818 Encounter for other preprocedural examination: Secondary | ICD-10-CM

## 2022-08-15 HISTORY — DX: Anemia in chronic kidney disease: D63.1

## 2022-08-15 HISTORY — DX: Chronic kidney disease, stage 4 (severe): N18.4

## 2022-08-15 HISTORY — DX: Personal history of other diseases of the digestive system: Z87.19

## 2022-08-15 HISTORY — DX: Unspecified osteoarthritis, unspecified site: M19.90

## 2022-08-15 HISTORY — DX: Sarcoidosis, unspecified: D86.9

## 2022-08-15 HISTORY — DX: Cardiomyopathy, unspecified: I42.9

## 2022-08-15 NOTE — Patient Instructions (Signed)
Your procedure is scheduled on:08-26-22 Tuesday Report to the Registration Desk on the 1st floor of the [facility].Then proceed to the 2nd floor Surgery Desk To find out your arrival time, please call [phone] between 1PM - 3PM on:08-25-22 Monday If your arrival time is 6:00 am, do not arrive prior to that time as the [facility] entrance doors do not open until 6:00 am.  REMEMBER: Instructions that are not followed completely may result in serious medical risk, up to and including death; or upon the discretion of your surgeon and anesthesiologist your surgery may need to be rescheduled.  Do not eat food after midnight the night before surgery.  No gum chewing, lozengers or hard candies.  You may however, drink Water up to 2 hours before you are scheduled to arrive for your surgery. Do not drink anything within 2 hours of your scheduled arrival time.  Type 1 and Type 2 diabetics should only drink water.  TAKE THESE MEDICATIONS THE MORNING OF SURGERY WITH A SIP OF WATER: -carvedilol (COREG) -methocarbamol (ROBAXIN) -rosuvastatin (CRESTOR) -omeprazole (PRILOSEC)-take one the night before and one on the morning of surgery - helps to prevent nausea after surgery.)  Patient was instructed by Dr Corlis Leak office to stop her Aspirin 81 mg and clopidogrel (PLAVIX) 7 days prior to surgery-Last dose will be on 08-18-22 Monday  Do NOT take any Insulin the morning of your surgery  One week prior to surgery: Stop Anti-inflammatories (NSAIDS) such as Advil, Aleve, Ibuprofen, Motrin, Naproxen, Naprosyn and Aspirin based products such as Excedrin, Goodys Powder, BC Powder.You may however, take Tylenol if needed for pain up until the day of surgery.  Stop ANY OVER THE COUNTER supplements/vitamins 7 days prior to surgery (Vitamin B12 and Vitamin D)  No Alcohol for 24 hours before or after surgery.  No Smoking including e-cigarettes for 24 hours prior to surgery.  No chewable tobacco products for at  least 6 hours prior to surgery.  No nicotine patches on the day of surgery.  Do not use any "recreational" drugs for at least a week prior to your surgery.  Please be advised that the combination of cocaine and anesthesia may have negative outcomes, up to and including death. If you test positive for cocaine, your surgery will be cancelled.  On the morning of surgery brush your teeth with toothpaste and water, you may rinse your mouth with mouthwash if you wish. Do not swallow any toothpaste or mouthwash.  Use CHG Soap as directed on instruction sheet.  Do not wear jewelry, make-up, hairpins, clips or nail polish.  Do not wear lotions, powders, or perfumes.   Do not shave body from the neck down 48 hours prior to surgery just in case you cut yourself which could leave a site for infection.  Also, freshly shaved skin may become irritated if using the CHG soap.  Contact lenses, hearing aids and dentures may not be worn into surgery.  Do not bring valuables to the hospital. [facility] is not responsible for any missing/lost belongings or valuables.   Notify your doctor if there is any change in your medical condition (cold, fever, infection).  Wear comfortable clothing (specific to your surgery type) to the hospital.  After surgery, you can help prevent lung complications by doing breathing exercises.  Take deep breaths and cough every 1-2 hours. Your doctor may order a device called an Incentive Spirometer to help you take deep breaths. When coughing or sneezing, hold a pillow firmly against your  incision with both hands. This is called "splinting." Doing this helps protect your incision. It also decreases belly discomfort.  If you are being admitted to the hospital overnight, leave your suitcase in the car. After surgery it may be brought to your room.  If you are being discharged the day of surgery, you will not be allowed to drive home. You will need a responsible adult (18 years  or older) to drive you home and stay with you that night.   If you are taking public transportation, you will need to have a responsible adult (18 years or older) with you. Please confirm with your physician that it is acceptable to use public transportation.   Please call the [facility] Dept. at [phone] if you have any questions about these instructions.  Surgery Visitation Policy:  Patients undergoing a surgery or procedure may have two family members or support persons with them as long as the person is not COVID-19 positive or experiencing its symptoms.   Inpatient Visitation:    Visiting hours are 7 a.m. to 8 p.m. Up to four visitors are allowed at one time in a patient room, including children. The visitors may rotate out with other people during the day. One designated support person (adult) may remain overnight.

## 2022-08-20 ENCOUNTER — Encounter: Payer: Self-pay | Admitting: Surgery

## 2022-08-20 NOTE — Progress Notes (Signed)
Perioperative Services  Pre-Admission/Anesthesia Testing Clinical Review  Date: 08/22/22  Patient Demographics:  Name: Tonya [name] DOB:   November 13, 1946 MRN:   [id number]  Planned Surgical Procedure(s):    Case: [id number] Date/Time: 08/26/22 0715   Procedure: XI ROBOTIC ASSISTED PARAESOPHAGEAL HERNIA REPAIR   Anesthesia type: General   Pre-op diagnosis: Paraesophageal hernia   Location: [facility] OR ROOM 06 / [place] [facility]   Surgeons: [name], MD   NOTE: Available PAT nursing documentation and vital signs have been reviewed. Clinical nursing staff has updated patient's PMH/PSHx, current medication list, and drug allergies/intolerances to ensure comprehensive history available to assist in medical decision making as it pertains to the aforementioned surgical procedure and anticipated anesthetic course. Extensive review of available clinical information performed. [place] [facility] and PSHx updated with any diagnoses/procedures that  may have been inadvertently omitted during her intake with the pre-admission testing department's nursing staff.  Clinical Discussion:  Tonya [name] is a 76 y.o. female who is submitted for pre-surgical anesthesia review and clearance prior to her undergoing the above procedure. Patient has never been a smoker. Pertinent PMH includes: cardiomyopathy, HFpEF, CVA, HTN, HLD, T2DM, sarcoidosis, CKD-IV, GERD (on daily PPI), paraesophageal hernia, OA, diabetic neuropathy, diabetic retinopathy, anemia of chronic renal disease.  Patient is followed by cardiology [name] Situ, MD). She was last seen in the cardiology clinic on 07/01/2022; notes reviewed.  At the time of clinic visit, patient reported to be doing well overall from a cardiovascular perspective.  She denied any episodes of chest pain, shortness breath, PND, orthopnea, palpitations, vertiginous symptoms, or presyncope/syncope.  Patient with chronic peripheral edema that was reported to be at  baseline.  Patient with a past medical history significant for cardiovascular diagnoses.  Patient reported to have suffered a CVA back in 08/2017.  She has no residual deficits resulting from this neurological event.  Patient with a history of cardiomyopathy and resulting HFpEF. Ejection fraction is routinely monitored. Last TTE was performed on 11/22/2019 revealed a normal left ventricular systolic function with EF 60 to 65%.  Moderate LVH. Diastolic Doppler parameters consistent with abnormal relaxation (G1DD).  Trivial tricuspid valve regurgitation noted.  There is no evidence of a significant transvalvular gradient to suggest stenosis.  Following CVA, patient remains on daily DAPT therapy (ASA + clopidogrel).  Patient reportedly compliant with therapy with no evidence or reports of GI bleeding.  Blood pressure reasonably controlled at 138/70 on currently prescribed beta-blocker (carvedilol), ARB (olmesartan), and diuretic (torsemide) therapies.  Patient is on rosuvastatin for her HLD diagnosis and ASCVD prevention.  T2DM uncontrolled on currently prescribed regimen; last HgbA1c was 8.5% when checked on 08/04/2022. She does not have an OSAH diagnosis.  Patient maintains a sedentary lifestyle.  Reportedly she spends majority of her time sitting in her chair watching TV.  Patient requires the use of a cane for safe ambulation.  She has no formal exercise regimen.  Functional capacity limited by chronic back pain, fatigue, deconditioning, and multiple medical comorbidities.  With that being said, patient questionably able to achieve 4 METS of activity without experiencing angina/anginal equivalent symptoms.  No changes were made to her medication regimen.  Patient to follow-up with outpatient cardiology in 1 year or sooner if needed.  Tonya [name] is scheduled for an elective XI ROBOTIC ASSISTED PARAESOPHAGEAL HERNIA REPAIR on 08/26/2022 with Dr. Caroleen Hamman, MD. Given patient's past medical history  significant for cardiovascular diagnoses, presurgical cardiac clearance was sought by the PAT team.  Per cardiology, "based ACC/AHA guidelines, the patient's past medical history, and the amount of time since her last clinic visit, this patient would be at an overall ACCEPTABLE risk for the planned procedure without further cardiovascular testing or intervention at this time".  Again, this patient is on daily DAPT therapy.  She has been instructed on recommendations from her PCP for holding her both her ASA and clopidogrel for 7 days prior to her procedure with plans to restart as soon as postoperative bleeding risk felt to be minimized by her primary attending surgeon. The patient is aware that her last dose of her DAPT medications should be on 08/18/2022.  Patient denies previous perioperative complications with anesthesia in the past. In review of the available records, it is noted that patient underwent a general anesthetic course here at [facility] (ASA III) in 06/2022 without documented complications.      08/11/2022    1:28 PM 08/05/2022    2:37 PM 08/05/2022    2:01 PM  Vitals with BMI  Height _0     Weight 162 lbs 6 oz    BMI 38.46    Systolic [phone]  Diastolic 97 86 85  Pulse 85  89    Providers/Specialists:   NOTE: Primary physician provider listed below. Patient may have been seen by APP or partner within same practice.   PROVIDER ROLE / SPECIALTY LAST Suszanne Finch, MD General Surgery (Surgeon) 08/11/2022  [name], NP Primary Care Provider 08/05/2022  Ida Rogue, MD Cardiology 07/02/2022  Earlie Server, MD Hematology 07/23/2022  Lavone Orn, MD Endocrinology 08/04/2022  Murlean Iba, MD Nephrology 07/15/2022   Allergies:  Atorvastatin, Gabapentin, Pravastatin, Pregabalin, and Trulicity [dulaglutide]  Current Home Medications:   No current facility-administered medications for this encounter.    acetaminophen  (TYLENOL) 650 MG CR tablet   aspirin 81 MG tablet   carvedilol (COREG) 3.125 MG tablet   clopidogrel (PLAVIX) 75 MG tablet   docusate sodium (COLACE) 100 MG capsule   HUMALOG KWIKPEN 100 UNIT/ML KiwkPen   methocarbamol (ROBAXIN) 500 MG tablet   NOVOFINE 32G X 6 MM MISC   olmesartan (BENICAR) 40 MG tablet   omeprazole (PRILOSEC) 40 MG capsule   pioglitazone (ACTOS) 30 MG tablet   rosuvastatin (CRESTOR) 5 MG tablet   torsemide (DEMADEX) 20 MG tablet   vitamin B-12 (CYANOCOBALAMIN) 100 MCG tablet   Vitamin D, Cholecalciferol, 50 MCG (2000 UT) CAPS   History:   Past Medical History:  Diagnosis Date   (HFpEF) heart failure with preserved ejection fraction ([place])    a.) TTE 05/29/2017: EF 55-60%, mild LAE, G1DD; b.) TTE 09/07/2017: EF >55%, mild LAE, Ao sclerosis without stenosis, degen MV disease, G2DD; c.) TTE 12/02/2018: EF 55-60%, mild LVH, Mild BAE, triv TR, G1DD; d.) TTE 11/22/2019: EF 60-65%, mod LVH, mild TR, G1DD   Anemia in stage 4 chronic kidney disease (HCC)    Arthritis    Cardiomyopathy ([place])    CKD (chronic kidney disease) stage 4, GFR 15-29 ml/min (HCC)    CVA (cerebral vascular accident) ([place]) 08/2017   a.) no residual deficits   Diabetic neuropathy ([place])    Diverticulitis    GERD (gastroesophageal reflux disease)    History of hiatal hernia    Hyperlipidemia    Hypertension    Long term current use of antithrombotics/antiplatelets    a.) DAPT therapy (ASA + clopidogrel)   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 ([place])  Paraesophageal hernia    PHN (postherpetic neuralgia)    Sarcoidosis    Shingles    Type 2 diabetes mellitus treated with insulin ([place])    Wears dentures    partial upper   Past Surgical History:  Procedure Laterality Date   CATARACT EXTRACTION W/PHACO Left 02/05/2021   Procedure: CATARACT EXTRACTION PHACO AND INTRAOCULAR LENS PLACEMENT ([place]) LEFT DIABETIC 5.47 00:51.4;  Surgeon: Birder Robson, MD;  Location: [place];  Service: Ophthalmology;  Laterality: Left;  Diabetic - insulin and oral meds   CATARACT EXTRACTION W/PHACO Right 03/19/2021   Procedure: CATARACT EXTRACTION PHACO AND INTRAOCULAR LENS PLACEMENT ([facility]) RIGHT DIABETIC 6.09 00:39.6;  Surgeon: Birder Robson, MD;  Location: [place];  Service: Ophthalmology;  Laterality: Right;   COLONOSCOPY  12/15/2006   COLONOSCOPY WITH PROPOFOL N/A 06/06/2019   Procedure: COLONOSCOPY WITH PROPOFOL;  Surgeon: [name], MD;  Location: [facility] ENDOSCOPY;  Service: Gastroenterology;  Laterality: N/A;   COLONOSCOPY WITH PROPOFOL N/A 01/16/2020   Procedure: COLONOSCOPY WITH BIOPSY;  Surgeon: [name], MD;  Location: [place];  Service: Endoscopy;  Laterality: N/A;  Diabetic - insulin and oral meds   ESOPHAGOGASTRODUODENOSCOPY (EGD) WITH PROPOFOL N/A 06/23/2022   Procedure: ESOPHAGOGASTRODUODENOSCOPY (EGD) WITH PROPOFOL;  Surgeon: [name], MD;  Location: [facility] ENDOSCOPY;  Service: Gastroenterology;  Laterality: N/A;   EYE SURGERY     POLYPECTOMY N/A 01/16/2020   Procedure: POLYPECTOMY;  Surgeon: [name], MD;  Location: [place];  Service: Endoscopy;  Laterality: N/A;   TUBAL LIGATION     Family History  Problem Relation Age of Onset   Diabetes Mother    Stroke Mother    Hyperlipidemia Father    Hypertension Father    Diabetes Sister    Diabetes Brother    Gout Son    Diabetes Brother    Kidney disease Son    Lymphoma Sister    Heart attack Sister    Breast cancer Neg Hx    Social History   Tobacco Use   Smoking status: Never   Smokeless tobacco: Never  Vaping Use   Vaping Use: Never used  Substance Use Topics   Alcohol use: No    Alcohol/week: 0.0 standard drinks of alcohol   Drug use: No    Pertinent Clinical Results:  LABS: Labs reviewed: Acceptable for surgery.  Lab Results  Component Value Date   WBC 4.9 08/21/2022   HGB 10.7 (L) 08/21/2022   HCT 34.2 (L) 08/21/2022   MCV 88.4  08/21/2022   PLT 268 08/21/2022    Ref Range & Units 07/15/2022  Glucose 65 - 139 mg/dL 70   BUN 7 - 25 mg/dL 47 High    Creatinine 0.60 - 1.00 mg/dL 2.24 High    eGFR CKD-EPI CR 2021 > OR = 60 mL/min/1.62m 22 Low    BUN/Creatinine Ratio 6 - 22 (calc) 21   Sodium 135 - 146 mmol/L 144   Potassium 3.5 - 5.3 mmol/L 5.0   Chloride 98 - 110 mmol/L 112 High    Bicarbonate (CO2) 20 - 32 mmol/L 24   Calcium 8.6 - 10.4 mg/dL 9.1   Phosphorus 2.1 - 4.3 mg/dL 3.3   Albumin 3.6 - 5.1 g/dL 3.4 Low    Resulting Agency  [facility]    Ref Range & Units 08/04/2022  Hemoglobin A1C 4.2 - 5.6 % 8.5 High    Average Blood Glucose (Calc) mg/dL 197   Normal Range:    4.2 - 5.6%  Increased Risk:  5.7 - 6.4%  Diabetes:        >= 6.5%  Glycemic Control for adults with diabetes:  <7%   Specimen Collected: 08/04/22 11:09   Performed by: [place]: 08/04/22 11:19  Received From: [place]  Result Received: 08/05/22 13:53    ECG: Date: 07/01/2022 Time ECG obtained: 0850 AM Rate: 77 bpm Rhythm: normal sinus Axis (leads I and aVF): Left axis deviation Intervals: PR 160 ms. QRS 88 ms. QTc 436 ms. ST segment and T wave changes: Lateral T wave abnormality  Comparison: Similar to previous tracing obtained on 04/19/2021   IMAGING / PROCEDURES: DG ESOPHAGUS W DOUBLE CM performed on 07/18/2022 Moderate hiatal hernia.   Mild gastroesophageal reflux. Mildly patulous lower thoracic esophagus.  Normal esophageal motility.  No evidence of esophageal mass or clinically significant CT face stricture.  CT ABDOMEN PELVIS WO CONTRAST performed on 07/11/2022 Moderate sliding hiatal hernia. Colonic diverticulosis. No radiographic evidence of diverticulitis. Large amount of stool in rectum; fecal impaction cannot be excluded. Small benign left adrenal adenoma. 2.7 cm benign appearing right ovarian cyst. No follow-up imaging recommended.   CT CHEST WO CONTRAST  performed on 04/08/2022 No evidence of sarcoidosis within lungs.  No mediastinal lymphadenopathy. Large hiatal hernia. In addition to the hernia, there is food material within the esophagus to level of thoracic inlet. Concern for obstructing esophageal lesion which is occult by CT. Recommend upper endoscopy for further evaluation.  TRANSTHORACIC ECHOCARDIOGRAM performed on 12/02/2019 Left ventricular ejection fraction, by visual estimation, is 60 to 65%. The left ventricle has normal function. There is moderately increased left ventricular hypertrophy.  Left ventricular diastolic parameters are consistent with Grade I diastolic dysfunction (impaired relaxation).  The left ventricle has no regional wall motion abnormalities.  Global right ventricle has normal systolic function.The right ventricular size is normal. No increase in right ventricular wall \thickness.  Left atrial size was normal.  TR signal is inadequate for assessing pulmonary artery systolic pressure.   Impression and Plan:  Tonya [name] has been referred for pre-anesthesia review and clearance prior to her undergoing the planned anesthetic and procedural courses. Available labs, pertinent testing, and imaging results were personally reviewed by me. This patient has been appropriately cleared by cardiology with an overall ACCEPTABLE risk of significant perioperative cardiovascular complications.  Based on clinical review performed today (08/22/22), barring any significant acute changes in the patient's overall condition, it is anticipated that she will be able to proceed with the planned surgical intervention. Any acute changes in clinical condition may necessitate her procedure being postponed and/or cancelled. Patient will meet with anesthesia team (MD and/or CRNA) on the day of her procedure for preoperative evaluation/assessment. Questions regarding anesthetic course will be fielded at that time.   Pre-surgical instructions  were reviewed with the patient during her PAT appointment and questions were fielded by PAT clinical staff. Patient was advised that if any questions or concerns arise prior to her procedure then she should return a call to PAT and/or her surgeon's office to discuss.  Honor Loh, MSN, APRN, FNP-C, CEN [facility]  [name]: [phone] Fax: [phone] 08/22/22 8:26 AM  NOTE: This note has been prepared using Dragon dictation software. Despite my best ability to proofread, there is always the potential that unintentional transcriptional errors may still occur from this process.

## 2022-08-20 NOTE — Telephone Encounter (Signed)
Hi Bryan, she does not have a cardiac implication for DAPT. History of CVA 2018 and Plavix is prescribed by PCP.  Let us know if you have any further questions.  Thank you, Emmaline Life, NP-C     08/20/2022, 2:26 PM [address], [address] Office [phone] Fax [phone]

## 2022-08-21 ENCOUNTER — Inpatient Hospital Stay: Payer: Medicare Other | Attending: Oncology

## 2022-08-21 DIAGNOSIS — D631 Anemia in chronic kidney disease: Secondary | ICD-10-CM | POA: Diagnosis not present

## 2022-08-21 DIAGNOSIS — Z79899 Other long term (current) drug therapy: Secondary | ICD-10-CM | POA: Insufficient documentation

## 2022-08-21 DIAGNOSIS — N184 Chronic kidney disease, stage 4 (severe): Secondary | ICD-10-CM | POA: Diagnosis not present

## 2022-08-21 LAB — IRON AND TIBC
Iron: 82 ug/dL (ref 28–170)
Saturation Ratios: 26 % (ref 10.4–31.8)
TIBC: 311 ug/dL (ref 250–450)
UIBC: 229 ug/dL

## 2022-08-21 LAB — BASIC METABOLIC PANEL
Anion gap: 8 (ref 5–15)
BUN: 54 mg/dL — ABNORMAL HIGH (ref 8–23)
CO2: 25 mmol/L (ref 22–32)
Calcium: 9.1 mg/dL (ref 8.9–10.3)
Chloride: 110 mmol/L (ref 98–111)
Creatinine, Ser: 2.38 mg/dL — ABNORMAL HIGH (ref 0.44–1.00)
GFR, Estimated: 21 mL/min — ABNORMAL LOW (ref >60)
Glucose, Bld: 108 mg/dL — ABNORMAL HIGH (ref 70–99)
Potassium: 4.3 mmol/L (ref 3.5–5.1)
Sodium: 143 mmol/L (ref 135–145)

## 2022-08-21 LAB — CBC WITH DIFFERENTIAL/PLATELET
Abs Immature Granulocytes: 0.01 10*3/uL (ref 0.00–0.07)
Basophils Absolute: 0 10*3/uL (ref 0.0–0.1)
Basophils Relative: 1 %
Eosinophils Absolute: 0.1 10*3/uL (ref 0.0–0.5)
Eosinophils Relative: 3 %
HCT: 34.2 % — ABNORMAL LOW (ref 36.0–46.0)
Hemoglobin: 10.7 g/dL — ABNORMAL LOW (ref 12.0–15.0)
Immature Granulocytes: 0 %
Lymphocytes Relative: 25 %
Lymphs Abs: 1.2 10*3/uL (ref 0.7–4.0)
MCH: 27.6 pg (ref 26.0–34.0)
MCHC: 31.3 g/dL (ref 30.0–36.0)
MCV: 88.4 fL (ref 80.0–100.0)
Monocytes Absolute: 0.3 10*3/uL (ref 0.1–1.0)
Monocytes Relative: 5 %
Neutro Abs: 3.2 10*3/uL (ref 1.7–7.7)
Neutrophils Relative %: 66 %
Platelets: 268 10*3/uL (ref 150–400)
RBC: 3.87 MIL/uL (ref 3.87–5.11)
RDW: 15.3 % (ref 11.5–15.5)
WBC: 4.9 10*3/uL (ref 4.0–10.5)
nRBC: 0 % (ref 0.0–0.2)

## 2022-08-21 LAB — FERRITIN: Ferritin: 148 ng/mL (ref 11–307)

## 2022-08-22 NOTE — Progress Notes (Addendum)
  Perioperative Services: Pre-Admission/Anesthesia Testing  Abnormal Lab Notification    Date: 08/15/22  Name: Tonya [name] MRN:   [id number]  Re: Abnormal labs noted during PAT appointment   Provider(s) Notified: Caroleen Hamman, MD Notification mode: Routed and/or faxed via [facility]   ABNORMAL LAB VALUE(S): Lab Results  Component Value Date   GLUCOSE 278 (H) 04/15/2022   Notes: Patient with a T2DM diagnosis. She is currently on both oral (pioglitazone) and parenteral (insulin lispro) therapies. Last Hgb A1c was 8.5% on 08/04/2022. In efforts to reduce his risk of developing SSI, or other potential perioperative complications, this communication is being sent in order to determine if patient is deemed to have adequate medical optimization, including preoperative glycemic control.   With that being said, the benefit of improving glycemic control must be weighed against the overall risk associated with delaying a necessary elective surgery for this patient.  ADDENDUM 08/22/2022 at 0808 AM: BMP repeated yesterday (08/21/2022). Glucose level with noted improvement to 108 mg/dL. Followed with endocrinology; last visit 08/04/2022. Insulin dosing reinforced during this visit, as patient not routinely taking meal time coverage with lunch. Doubt significant change in Hgb A1c level this early, therefore it was not repeated along with other preoperative labs.   Honor Loh, MSN, APRN, FNP-C, CEN [facility]  [name]: [phone] 08/15/22 17:00 PM

## 2022-08-25 ENCOUNTER — Encounter: Payer: Self-pay | Admitting: Oncology

## 2022-08-25 ENCOUNTER — Inpatient Hospital Stay (HOSPITAL_BASED_OUTPATIENT_CLINIC_OR_DEPARTMENT_OTHER): Payer: Medicare Other | Admitting: Oncology

## 2022-08-25 ENCOUNTER — Inpatient Hospital Stay: Payer: Medicare Other

## 2022-08-25 VITALS — BP 135/75 | HR 99 | Temp 98.7°F | Resp 16 | Wt 161.5 lb

## 2022-08-25 DIAGNOSIS — N184 Chronic kidney disease, stage 4 (severe): Secondary | ICD-10-CM

## 2022-08-25 DIAGNOSIS — D631 Anemia in chronic kidney disease: Secondary | ICD-10-CM

## 2022-08-25 DIAGNOSIS — Z79899 Other long term (current) drug therapy: Secondary | ICD-10-CM | POA: Diagnosis not present

## 2022-08-25 MED ORDER — CHLORHEXIDINE GLUCONATE CLOTH 2 % EX PADS: 6.0000 | MEDICATED_PAD | Freq: Once | CUTANEOUS | Status: DC

## 2022-08-25 MED ORDER — CHLORHEXIDINE GLUCONATE 0.12 % MT SOLN: 15.0000 mL | Freq: Once | OROMUCOSAL | Status: AC

## 2022-08-25 MED ORDER — CEFAZOLIN SODIUM-DEXTROSE 2-4 GM/100ML-% IV SOLN
2.0000 g | INTRAVENOUS | Status: AC
  Administered 2022-08-26: 2 g via INTRAVENOUS

## 2022-08-25 MED ORDER — ACETAMINOPHEN 500 MG PO TABS: 1000.0000 mg | ORAL_TABLET | ORAL | Status: AC

## 2022-08-25 MED ORDER — CELECOXIB 200 MG PO CAPS: 200.0000 mg | ORAL_CAPSULE | ORAL | Status: AC

## 2022-08-25 MED ORDER — SODIUM CHLORIDE 0.9 % IV SOLN: INTRAVENOUS | Status: DC

## 2022-08-25 MED ORDER — ORAL CARE MOUTH RINSE: 15.0000 mL | Freq: Once | OROMUCOSAL | Status: AC

## 2022-08-25 NOTE — Assessment & Plan Note (Signed)
History of iron deficiency anemia,  anemia secondary to chronic kidney disease. Labs reviewed and discussed patient. Globin is above 10.  No need for erythropoietin replacement therapy. Iron panel shows stable ferritin.  Recommend patient to take oral iron supplementation daily for maintenance.

## 2022-08-25 NOTE — Progress Notes (Signed)
Hematology/Oncology Progress note [phone] [phone]      Patient Care Team: [name], NP as PCP - General (Nurse Practitioner) [name], MD as PCP - Cardiology (Cardiology) Solum, Betsey Holiday, MD as Physician Assistant (Endocrinology) Lavonia Dana, MD as Consulting Physician (Nephrology) [name], MD as Consulting Physician (Cardiology) Pa, [place] (Optometry)  ASSESSMENT & PLAN:   Anemia in stage 4 chronic kidney disease ([place]) History of iron deficiency anemia,  anemia secondary to chronic kidney disease. Labs reviewed and discussed patient. Globin is above 10.  No need for erythropoietin replacement therapy. Iron panel shows stable ferritin.  Recommend patient to take oral iron supplementation daily for maintenance.   Orders Placed This Encounter  Procedures   Hemoglobin and hematocrit, blood    Standing Status:   Standing    Number of Occurrences:   4    Standing Expiration Date:   08/26/2023   CBC with Differential/Platelet    Standing Status:   Future    Standing Expiration Date:   08/26/2023   Iron and TIBC([facility])    Standing Status:   Future    Standing Expiration Date:   08/26/2023   Ferritin    Standing Status:   Future    Standing Expiration Date:   08/26/2023   Vitamin B12    Standing Status:   Future    Standing Expiration Date:   08/26/2023   Follow-up H&H labs in 4w, 8w,12w +/- retacrit 4 months, lab prior to MD +/- Venofer +/- Retacrit  All questions were answered. The patient knows to call the clinic with any problems, questions or concerns.  Earlie Server, MD, PhD [facility] Hematology Oncology 08/25/2022   CHIEF COMPLAINTS/REASON FOR VISIT:  Follow-up for anemia  HISTORY OF PRESENTING ILLNESS:  Tonya [name] is a  76 y.o.  female with PMH listed below who was referred to me for evaluation of anemia Reviewed patient's recent labs that was done at [facility] office. Labs revealed  anemia with hemoglobin of 8.9, MCV 82, normal platelet and wbc  Reviewed patient's previous labs ordered by primary care physician's office, anemia is chronic onset , duration is since 2017 No aggravating or improving factors.  Associated signs and symptoms: Patient reports fatigue.  SOB with exertion.  Denies weight loss, easy bruising, hematochezia, hemoptysis, hematuria. Context: History of GI bleeding: denies               History of Chronic kidney disease: CKD, follows up with Dr.Kolluru.                History of autoimmune disease: denies               History of hemolytic anemia. denies               Last colonoscopy: underwent colonoscopy on 06/06/2019.  Due to poor preparation, patient was scheduled to repeat colonoscopy due to suboptimal bowel preparation.  Patient told me that she is waiting for insurance approval for repeat procedure.  Previously tolerated IV Venofer treatments very well.  INTERVAL HISTORY Tonya [name] is a 76 y.o. female who has above history reviewed by me today presents to reestablish care for chronic anemia. Patient reports feeling well.  She is going to have elective hernia repair surgery.  Fatigue level is stable, slightly improved.  Review of Systems  Constitutional:  Positive for fatigue. Negative for appetite change, chills and fever.  HENT:   Negative for hearing  loss and voice change.   Eyes:  Negative for eye problems.  Respiratory:  Negative for chest tightness and cough.   Cardiovascular:  Negative for chest pain.  Gastrointestinal:  Negative for abdominal distention, abdominal pain and blood in stool.  Endocrine: Negative for hot flashes.  Genitourinary:  Negative for difficulty urinating and frequency.   Musculoskeletal:  Negative for arthralgias.  Skin:  Negative for itching and rash.  Neurological:  Negative for extremity weakness.  Hematological:  Negative for adenopathy.  Psychiatric/Behavioral:  Negative for confusion.      MEDICAL HISTORY:  Past Medical History:  Diagnosis Date   (HFpEF) heart failure with preserved ejection fraction ([place])    a.) TTE 05/29/2017: EF 55-60%, mild LAE, G1DD; b.) TTE 09/07/2017: EF >55%, mild LAE, Ao sclerosis without stenosis, degen MV disease, G2DD; c.) TTE 12/02/2018: EF 55-60%, mild LVH, Mild BAE, triv TR, G1DD; d.) TTE 11/22/2019: EF 60-65%, mod LVH, mild TR, G1DD   Anemia in stage 4 chronic kidney disease (HCC)    Arthritis    Cardiomyopathy ([place])    CKD (chronic kidney disease) stage 4, GFR 15-29 ml/min (HCC)    CVA (cerebral vascular accident) ([place]) 08/2017   a.) no residual deficits   Diabetic neuropathy (HCC)    Diverticulitis    GERD (gastroesophageal reflux disease)    History of hiatal hernia    Hyperlipidemia    Hypertension    Long term current use of antithrombotics/antiplatelets    a.) DAPT therapy (ASA + clopidogrel)   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 ([place])    Paraesophageal hernia    PHN (postherpetic neuralgia)    Sarcoidosis    Shingles    Type 2 diabetes mellitus treated with insulin ([place])    Wears dentures    partial upper    SURGICAL HISTORY: Past Surgical History:  Procedure Laterality Date   CATARACT EXTRACTION W/PHACO Left 02/05/2021   Procedure: CATARACT EXTRACTION PHACO AND INTRAOCULAR LENS PLACEMENT ([place]) LEFT DIABETIC 5.47 00:51.4;  Surgeon: Birder Robson, MD;  Location: [place];  Service: Ophthalmology;  Laterality: Left;  Diabetic - insulin and oral meds   CATARACT EXTRACTION W/PHACO Right 03/19/2021   Procedure: CATARACT EXTRACTION PHACO AND INTRAOCULAR LENS PLACEMENT ([facility]) RIGHT DIABETIC 6.09 00:39.6;  Surgeon: Birder Robson, MD;  Location: [place];  Service: Ophthalmology;  Laterality: Right;   COLONOSCOPY  12/15/2006   COLONOSCOPY WITH PROPOFOL N/A 06/06/2019   Procedure: COLONOSCOPY WITH PROPOFOL;  Surgeon: [name], MD;  Location: [facility] ENDOSCOPY;  Service:  Gastroenterology;  Laterality: N/A;   COLONOSCOPY WITH PROPOFOL N/A 01/16/2020   Procedure: COLONOSCOPY WITH BIOPSY;  Surgeon: [name], MD;  Location: [place];  Service: Endoscopy;  Laterality: N/A;  Diabetic - insulin and oral meds   ESOPHAGOGASTRODUODENOSCOPY (EGD) WITH PROPOFOL N/A 06/23/2022   Procedure: ESOPHAGOGASTRODUODENOSCOPY (EGD) WITH PROPOFOL;  Surgeon: [name], MD;  Location: [facility] ENDOSCOPY;  Service: Gastroenterology;  Laterality: N/A;   EYE SURGERY     POLYPECTOMY N/A 01/16/2020   Procedure: POLYPECTOMY;  Surgeon: [name], MD;  Location: [place];  Service: Endoscopy;  Laterality: N/A;   TUBAL LIGATION      SOCIAL HISTORY: Social History   Socioeconomic History   Marital status: Married    Spouse name: Not on file   Number of children: Not on file   Years of education: Not on file   Highest education level: GED or equivalent  Occupational History   Occupation: retired  Tobacco Use  Smoking status: Never   Smokeless tobacco: Never  Vaping Use   Vaping Use: Never used  Substance and Sexual Activity   Alcohol use: No    Alcohol/week: 0.0 standard drinks of alcohol   Drug use: No   Sexual activity: Yes  Other Topics Concern   Not on file  Social History Narrative   Not on file   Social Determinants of Health   Financial Resource Strain: Low Risk  (11/12/2021)   Overall Financial Resource Strain (CARDIA)    Difficulty of Paying Living Expenses: Not hard at all  Food Insecurity: No Food Insecurity (11/12/2021)   Hunger Vital Sign    Worried About Running Out of Food in the Last Year: Never true    Ran Out of Food in the Last Year: Never true  Transportation Needs: No Transportation Needs (11/12/2021)   PRAPARE - Hydrologist (Medical): No    Lack of Transportation (Non-Medical): No  Physical Activity: Inactive (11/12/2021)   Exercise Vital Sign    Days of Exercise per Week: 0 days    Minutes  of Exercise per Session: 0 min  Stress: No Stress Concern Present (11/12/2021)   [place]    Feeling of Stress : Not at all  Social Connections: [place] (11/12/2021)   Social Connection and Isolation Panel [NHANES]    Frequency of Communication with Friends and Family: Twice a week    Frequency of Social Gatherings with Friends and Family: Twice a week    Attends Religious Services: More than 4 times per year    Active Member of [employer] or Organizations: Yes    Attends Archivist Meetings: 1 to 4 times per year    Marital Status: Married  Human resources officer Violence: Not At Risk (11/12/2021)   Humiliation, Afraid, Rape, and Kick questionnaire    Fear of Current or Ex-Partner: No    Emotionally Abused: No    Physically Abused: No    Sexually Abused: No    FAMILY HISTORY: Family History  Problem Relation Age of Onset   Diabetes Mother    Stroke Mother    Hyperlipidemia Father    Hypertension Father    Diabetes Sister    Diabetes Brother    Gout Son    Diabetes Brother    Kidney disease Son    Lymphoma Sister    Heart attack Sister    Breast cancer Neg Hx     ALLERGIES:  is allergic to atorvastatin, gabapentin, pravastatin, pregabalin, and trulicity [dulaglutide].  MEDICATIONS:  Current Outpatient Medications  Medication Sig Dispense Refill   acetaminophen (TYLENOL) 650 MG CR tablet Take 650 mg by mouth every 8 (eight) hours as needed for pain.     aspirin 81 MG tablet Take 81 mg by mouth daily.     carvedilol (COREG) 3.125 MG tablet TAKE 1 TABLET BY MOUTH TWICE  DAILY WITH MEALS 180 tablet 3   clopidogrel (PLAVIX) 75 MG tablet TAKE 1 TABLET BY MOUTH  DAILY 90 tablet 4   docusate sodium (COLACE) 100 MG capsule Take 100 mg by mouth daily as needed for mild constipation.     HUMALOG KWIKPEN 100 UNIT/ML KiwkPen 6-10 Units See admin instructions. Take 10 units before morning meal. Take 8  units before the afternoon and evening snacks.  11   methocarbamol (ROBAXIN) 500 MG tablet Take 1 tablet by mouth every morning.     NOVOFINE 32G X  6 MM MISC USE AS DIRECTED THREE TIMES DAILY WITH HUMALOG 100 each 3   olmesartan (BENICAR) 40 MG tablet Take 1 tablet (40 mg total) by mouth daily. (Patient taking differently: Take 40 mg by mouth every morning.) 90 tablet 4   omeprazole (PRILOSEC) 40 MG capsule Take 1 capsule (40 mg total) by mouth in the morning and at bedtime. (Patient taking differently: Take 40 mg by mouth every morning.) 180 capsule 0   pioglitazone (ACTOS) 30 MG tablet Take 30 mg by mouth daily.     rosuvastatin (CRESTOR) 5 MG tablet Take 1 tablet (5 mg total) by mouth daily. (Patient taking differently: Take 5 mg by mouth every morning.) 90 tablet 3   torsemide (DEMADEX) 20 MG tablet Take 2 tablets (40 mg total) by mouth daily. (Patient taking differently: Take 40 mg by mouth every morning.) 180 tablet 3   vitamin B-12 (CYANOCOBALAMIN) 100 MCG tablet Take 100 mcg by mouth daily. (Patient not taking: Reported on 08/15/2022)     Vitamin D, Cholecalciferol, 50 MCG (2000 UT) CAPS Take 2,000 Units by mouth daily. (Patient not taking: Reported on 08/15/2022)     No current facility-administered medications for this visit.     PHYSICAL EXAMINATION: ECOG PERFORMANCE STATUS: 1 - Symptomatic but completely ambulatory Vitals:   08/25/22 1032  BP: 135/75  Pulse: 99  Resp: 16  Temp: 98.7 F (37.1 C)  SpO2: 100%   Filed Weights   08/25/22 1032  Weight: 161 lb 8 oz (73.3 kg)    Physical Exam Constitutional:      General: She is not in acute distress. HENT:     Head: Normocephalic and atraumatic.  Eyes:     General: No scleral icterus.    Pupils: Pupils are equal, round, and reactive to light.  Cardiovascular:     Rate and Rhythm: Normal rate.  Pulmonary:     Effort: Pulmonary effort is normal. No respiratory distress.     Breath sounds: No wheezing.  Abdominal:      General: Bowel sounds are normal. There is no distension.     Palpations: Abdomen is soft.  Musculoskeletal:        General: No deformity. Normal range of motion.     Cervical back: Normal range of motion and neck supple.  Skin:    General: Skin is warm and dry.     Findings: No erythema or rash.  Neurological:     Mental Status: She is alert and oriented to person, place, and time.     Cranial Nerves: No cranial nerve deficit.     Coordination: Coordination normal.  Psychiatric:        Mood and Affect: Mood normal.      LABORATORY DATA:  I have reviewed the data as listed    Latest Ref Rng & Units 08/21/2022   11:51 AM 08/01/2022    2:36 PM 07/21/2022    2:07 PM  CBC  WBC 4.0 - 10.5 K/uL 4.9  6.2  5.3   Hemoglobin 12.0 - 15.0 g/dL 10.7  9.4  8.9   Hematocrit 36.0 - 46.0 % 34.2  30.9  29.2   Platelets 150 - 400 K/uL 268  245  225       Latest Ref Rng & Units 08/21/2022   11:51 AM 04/15/2022    1:34 PM 02/12/2021    5:06 PM  CMP  Glucose 70 - 99 mg/dL 108  278  204   BUN 8 - 23 mg/dL  54  39  53   Creatinine 0.44 - 1.00 mg/dL 2.38  2.26  2.45   Sodium 135 - 145 mmol/L 143  140  139   Potassium 3.5 - 5.1 mmol/L 4.3  4.6  4.3   Chloride 98 - 111 mmol/L 110  109  109   CO2 22 - 32 mmol/L '25  25  21   '$ Calcium 8.9 - 10.3 mg/dL 9.1  8.8  8.7   Total Protein 6.5 - 8.1 g/dL  6.3  6.6   Total Bilirubin 0.3 - 1.2 mg/dL  0.6  0.6   Alkaline Phos 38 - 126 U/L  57  62   AST 15 - 41 U/L  21  21   ALT 0 - 44 U/L  12  12    Lab Results  Component Value Date   IRON 82 08/21/2022   TIBC 311 08/21/2022   FERRITIN 148 08/21/2022

## 2022-08-25 NOTE — Progress Notes (Signed)
Patient here for oncology follow-up appointment, expresses no new concerns at this time.    

## 2022-08-26 ENCOUNTER — Other Ambulatory Visit: Payer: Self-pay

## 2022-08-26 ENCOUNTER — Observation Stay
Admission: RE | Admit: 2022-08-26 | Discharge: 2022-08-27 | Disposition: A | Discharge status: Home / Self Care | Payer: Medicare Other | Attending: Surgery | Admitting: Surgery

## 2022-08-26 ENCOUNTER — Encounter
Admission: RE | Disposition: A | Discharge status: Home / Self Care | Payer: Self-pay | Source: Home / Self Care | Attending: Surgery

## 2022-08-26 ENCOUNTER — Ambulatory Visit: Payer: Medicare Other | Admitting: Urgent Care

## 2022-08-26 ENCOUNTER — Encounter: Payer: Self-pay | Admitting: Surgery

## 2022-08-26 DIAGNOSIS — D869 Sarcoidosis, unspecified: Secondary | ICD-10-CM | POA: Insufficient documentation

## 2022-08-26 DIAGNOSIS — K219 Gastro-esophageal reflux disease without esophagitis: Secondary | ICD-10-CM | POA: Insufficient documentation

## 2022-08-26 DIAGNOSIS — M1990 Unspecified osteoarthritis, unspecified site: Secondary | ICD-10-CM | POA: Diagnosis not present

## 2022-08-26 DIAGNOSIS — K449 Diaphragmatic hernia without obstruction or gangrene: Secondary | ICD-10-CM | POA: Diagnosis not present

## 2022-08-26 DIAGNOSIS — N183 Chronic kidney disease, stage 3 unspecified: Secondary | ICD-10-CM | POA: Diagnosis not present

## 2022-08-26 DIAGNOSIS — E1122 Type 2 diabetes mellitus with diabetic chronic kidney disease: Secondary | ICD-10-CM | POA: Diagnosis not present

## 2022-08-26 DIAGNOSIS — Z7982 Long term (current) use of aspirin: Secondary | ICD-10-CM | POA: Insufficient documentation

## 2022-08-26 DIAGNOSIS — Z8719 Personal history of other diseases of the digestive system: Secondary | ICD-10-CM

## 2022-08-26 DIAGNOSIS — Z8673 Personal history of transient ischemic attack (TIA), and cerebral infarction without residual deficits: Secondary | ICD-10-CM | POA: Insufficient documentation

## 2022-08-26 DIAGNOSIS — I639 Cerebral infarction, unspecified: Secondary | ICD-10-CM | POA: Diagnosis not present

## 2022-08-26 DIAGNOSIS — I130 Hypertensive heart and chronic kidney disease with heart failure and stage 1 through stage 4 chronic kidney disease, or unspecified chronic kidney disease: Secondary | ICD-10-CM | POA: Insufficient documentation

## 2022-08-26 DIAGNOSIS — I5030 Unspecified diastolic (congestive) heart failure: Secondary | ICD-10-CM | POA: Insufficient documentation

## 2022-08-26 DIAGNOSIS — Z9889 Other specified postprocedural states: Secondary | ICD-10-CM | POA: Diagnosis not present

## 2022-08-26 DIAGNOSIS — Z01810 Encounter for preprocedural cardiovascular examination: Secondary | ICD-10-CM | POA: Diagnosis not present

## 2022-08-26 DIAGNOSIS — Z7901 Long term (current) use of anticoagulants: Secondary | ICD-10-CM | POA: Insufficient documentation

## 2022-08-26 DIAGNOSIS — Z01818 Encounter for other preprocedural examination: Secondary | ICD-10-CM

## 2022-08-26 HISTORY — PX: INSERTION OF MESH: SHX5868

## 2022-08-26 HISTORY — DX: Other specified diabetes mellitus with moderate nonproliferative diabetic retinopathy without macular edema, unspecified eye: E13.3399

## 2022-08-26 HISTORY — PX: XI ROBOTIC ASSISTED PARAESOPHAGEAL HERNIA REPAIR: SHX6871

## 2022-08-26 HISTORY — DX: Diaphragmatic hernia without obstruction or gangrene: K44.9

## 2022-08-26 HISTORY — DX: Diverticulitis of intestine, part unspecified, without perforation or abscess without bleeding: K57.92

## 2022-08-26 HISTORY — DX: Zoster without complications: B02.9

## 2022-08-26 HISTORY — DX: Long term (current) use of antithrombotics/antiplatelets: Z79.02

## 2022-08-26 HISTORY — DX: Type 2 diabetes mellitus without complications: Z79.4

## 2022-08-26 HISTORY — DX: Other postherpetic nervous system involvement: B02.29

## 2022-08-26 HISTORY — DX: Type 2 diabetes mellitus with diabetic neuropathy, unspecified: E11.40

## 2022-08-26 HISTORY — DX: Gastro-esophageal reflux disease without esophagitis: K21.9

## 2022-08-26 HISTORY — DX: Type 2 diabetes mellitus without complications: E11.9

## 2022-08-26 LAB — CBC
HCT: 35.1 % — ABNORMAL LOW (ref 36.0–46.0)
Hemoglobin: 10.6 g/dL — ABNORMAL LOW (ref 12.0–15.0)
MCH: 26.7 pg (ref 26.0–34.0)
MCHC: 30.2 g/dL (ref 30.0–36.0)
MCV: 88.4 fL (ref 80.0–100.0)
Platelets: 269 10*3/uL (ref 150–400)
RBC: 3.97 MIL/uL (ref 3.87–5.11)
RDW: 14.9 % (ref 11.5–15.5)
WBC: 8.9 10*3/uL (ref 4.0–10.5)
nRBC: 0 % (ref 0.0–0.2)

## 2022-08-26 LAB — GLUCOSE, CAPILLARY
Glucose-Capillary: 151 mg/dL — ABNORMAL HIGH (ref 70–99)
Glucose-Capillary: 246 mg/dL — ABNORMAL HIGH (ref 70–99)
Glucose-Capillary: 227 mg/dL — ABNORMAL HIGH (ref 70–99)
Glucose-Capillary: 220 mg/dL — ABNORMAL HIGH (ref 70–99)

## 2022-08-26 LAB — CREATININE, SERUM
Creatinine, Ser: 2.53 mg/dL — ABNORMAL HIGH (ref 0.44–1.00)
GFR, Estimated: 19 mL/min — ABNORMAL LOW (ref >60)

## 2022-08-26 LAB — HEMOGLOBIN A1C
Hgb A1c MFr Bld: 8.1 % — ABNORMAL HIGH (ref 4.8–5.6)
Mean Plasma Glucose: 185.77 mg/dL

## 2022-08-26 SURGERY — REPAIR, HERNIA, PARAESOPHAGEAL, ROBOT-ASSISTED
Anesthesia: General

## 2022-08-26 MED ORDER — INSULIN DETEMIR 100 UNIT/ML ~~LOC~~ SOLN
5.0000 [IU] | Freq: Every day | SUBCUTANEOUS | Status: DC
  Administered 2022-08-27: 5 [IU] via SUBCUTANEOUS
  Filled 2022-08-26: qty 0.05

## 2022-08-26 MED ORDER — PROPOFOL 10 MG/ML IV BOLUS
INTRAVENOUS | Status: DC | PRN
  Administered 2022-08-26: 50 mg via INTRAVENOUS

## 2022-08-26 MED ORDER — BUPIVACAINE-EPINEPHRINE 0.25% -1:200000 IJ SOLN
INTRAMUSCULAR | Status: DC | PRN
  Administered 2022-08-26: 30 mL

## 2022-08-26 MED ORDER — OXYCODONE HCL 5 MG/5ML PO SOLN
ORAL | Status: AC
  Filled 2022-08-26: qty 5

## 2022-08-26 MED ORDER — ONDANSETRON HCL 4 MG/2ML IJ SOLN
INTRAMUSCULAR | Status: DC | PRN
  Administered 2022-08-26: 4 mg via INTRAVENOUS

## 2022-08-26 MED ORDER — OXYCODONE HCL 5 MG PO TABS: 5.0000 mg | ORAL_TABLET | Freq: Once | ORAL | Status: AC | PRN

## 2022-08-26 MED ORDER — ASPIRIN 81 MG PO TBEC
81.0000 mg | DELAYED_RELEASE_TABLET | Freq: Every day | ORAL | Status: DC
  Administered 2022-08-27: 81 mg via ORAL
  Filled 2022-08-26: qty 1

## 2022-08-26 MED ORDER — MELATONIN 5 MG PO TABS: 2.5000 mg | ORAL_TABLET | Freq: Every evening | ORAL | Status: DC | PRN

## 2022-08-26 MED ORDER — DEXAMETHASONE SODIUM PHOSPHATE 10 MG/ML IJ SOLN
INTRAMUSCULAR | Status: AC
  Filled 2022-08-26: qty 2

## 2022-08-26 MED ORDER — MORPHINE SULFATE (PF) 2 MG/ML IV SOLN: 2.0000 mg | INTRAVENOUS | Status: DC | PRN

## 2022-08-26 MED ORDER — METHOCARBAMOL 500 MG PO TABS
500.0000 mg | ORAL_TABLET | ORAL | Status: DC
  Administered 2022-08-27: 500 mg via ORAL
  Filled 2022-08-26: qty 1

## 2022-08-26 MED ORDER — FENTANYL CITRATE (PF) 100 MCG/2ML IJ SOLN
INTRAMUSCULAR | Status: AC
  Administered 2022-08-26: 25 ug via INTRAVENOUS
  Filled 2022-08-26: qty 2

## 2022-08-26 MED ORDER — INSULIN ASPART 100 UNIT/ML IJ SOLN
INTRAMUSCULAR | Status: AC
  Filled 2022-08-26: qty 1

## 2022-08-26 MED ORDER — HYDRALAZINE HCL 20 MG/ML IJ SOLN
10.0000 mg | INTRAMUSCULAR | Status: DC | PRN
  Administered 2022-08-26: 10 mg via INTRAVENOUS
  Filled 2022-08-26: qty 1

## 2022-08-26 MED ORDER — INSULIN ASPART 100 UNIT/ML IJ SOLN
4.0000 [IU] | Freq: Three times a day (TID) | INTRAMUSCULAR | Status: DC
  Filled 2022-08-26: qty 1

## 2022-08-26 MED ORDER — DIPHENHYDRAMINE HCL 50 MG/ML IJ SOLN: 12.5000 mg | Freq: Four times a day (QID) | INTRAMUSCULAR | Status: DC | PRN

## 2022-08-26 MED ORDER — DEXMEDETOMIDINE (PRECEDEX) IN NS 20 MCG/5ML (4 MCG/ML) IV SYRINGE
PREFILLED_SYRINGE | INTRAVENOUS | Status: DC | PRN
  Administered 2022-08-26 (×2): 4 ug via INTRAVENOUS

## 2022-08-26 MED ORDER — PROPOFOL 10 MG/ML IV BOLUS
INTRAVENOUS | Status: AC
  Filled 2022-08-26: qty 20

## 2022-08-26 MED ORDER — OXYCODONE HCL 5 MG PO TABS: 5.0000 mg | ORAL_TABLET | ORAL | Status: DC | PRN

## 2022-08-26 MED ORDER — LIDOCAINE HCL (CARDIAC) PF 100 MG/5ML IV SOSY
PREFILLED_SYRINGE | INTRAVENOUS | Status: DC | PRN
  Administered 2022-08-26: 80 mg via INTRAVENOUS

## 2022-08-26 MED ORDER — BUPIVACAINE-EPINEPHRINE (PF) 0.25% -1:200000 IJ SOLN
INTRAMUSCULAR | Status: AC
  Filled 2022-08-26: qty 30

## 2022-08-26 MED ORDER — ACETAMINOPHEN 500 MG PO TABS
ORAL_TABLET | ORAL | Status: AC
  Administered 2022-08-26: 1000 mg via ORAL
  Filled 2022-08-26: qty 2

## 2022-08-26 MED ORDER — INSULIN ASPART 100 UNIT/ML IJ SOLN
5.0000 [IU] | Freq: Once | INTRAMUSCULAR | Status: AC
  Administered 2022-08-26: 5 [IU] via SUBCUTANEOUS

## 2022-08-26 MED ORDER — INSULIN ASPART 100 UNIT/ML IJ SOLN
0.0000 [IU] | Freq: Every day | INTRAMUSCULAR | Status: DC
  Administered 2022-08-26: 2 [IU] via SUBCUTANEOUS
  Filled 2022-08-26: qty 1

## 2022-08-26 MED ORDER — DIPHENHYDRAMINE HCL 12.5 MG/5ML PO ELIX: 12.5000 mg | ORAL_SOLUTION | Freq: Four times a day (QID) | ORAL | Status: DC | PRN

## 2022-08-26 MED ORDER — FENTANYL CITRATE (PF) 100 MCG/2ML IJ SOLN
25.0000 ug | INTRAMUSCULAR | Status: AC | PRN
  Administered 2022-08-26 (×2): 25 ug via INTRAVENOUS

## 2022-08-26 MED ORDER — ONDANSETRON HCL 4 MG/2ML IJ SOLN: 4.0000 mg | Freq: Four times a day (QID) | INTRAMUSCULAR | Status: DC | PRN

## 2022-08-26 MED ORDER — VISTASEAL 10 ML SINGLE DOSE KIT
PACK | CUTANEOUS | Status: DC | PRN
  Administered 2022-08-26: 10 mL via TOPICAL

## 2022-08-26 MED ORDER — LABETALOL HCL 5 MG/ML IV SOLN: 5.0000 mg | INTRAVENOUS | Status: DC | PRN

## 2022-08-26 MED ORDER — LIDOCAINE HCL (PF) 2 % IJ SOLN
INTRAMUSCULAR | Status: AC
  Filled 2022-08-26: qty 10

## 2022-08-26 MED ORDER — ONDANSETRON 4 MG PO TBDP: 4.0000 mg | ORAL_TABLET | Freq: Four times a day (QID) | ORAL | Status: DC | PRN

## 2022-08-26 MED ORDER — CARVEDILOL 6.25 MG PO TABS
3.1250 mg | ORAL_TABLET | Freq: Two times a day (BID) | ORAL | Status: DC
  Administered 2022-08-26 – 2022-08-27 (×2): 3.125 mg via ORAL
  Filled 2022-08-26 (×2): qty 1

## 2022-08-26 MED ORDER — PHENYLEPHRINE 80 MCG/ML (10ML) SYRINGE FOR IV PUSH (FOR BLOOD PRESSURE SUPPORT)
PREFILLED_SYRINGE | INTRAVENOUS | Status: DC | PRN
  Administered 2022-08-26 (×2): 40 ug via INTRAVENOUS
  Administered 2022-08-26 (×5): 80 ug via INTRAVENOUS

## 2022-08-26 MED ORDER — OXYCODONE HCL 5 MG/5ML PO SOLN
5.0000 mg | Freq: Once | ORAL | Status: AC | PRN
  Administered 2022-08-26: 5 mg via ORAL

## 2022-08-26 MED ORDER — INSULIN ASPART 100 UNIT/ML IJ SOLN
0.0000 [IU] | Freq: Three times a day (TID) | INTRAMUSCULAR | Status: DC
  Administered 2022-08-26: 5 [IU] via SUBCUTANEOUS
  Filled 2022-08-26: qty 1

## 2022-08-26 MED ORDER — ROCURONIUM BROMIDE 100 MG/10ML IV SOLN
INTRAVENOUS | Status: DC | PRN
  Administered 2022-08-26: 20 mg via INTRAVENOUS
  Administered 2022-08-26: 60 mg via INTRAVENOUS
  Administered 2022-08-26: 20 mg via INTRAVENOUS

## 2022-08-26 MED ORDER — CEFAZOLIN SODIUM-DEXTROSE 2-4 GM/100ML-% IV SOLN
INTRAVENOUS | Status: AC
  Filled 2022-08-26: qty 100

## 2022-08-26 MED ORDER — SUGAMMADEX SODIUM 200 MG/2ML IV SOLN
INTRAVENOUS | Status: DC | PRN
  Administered 2022-08-26: 200 mg via INTRAVENOUS

## 2022-08-26 MED ORDER — VISTASEAL 10 ML SINGLE DOSE KIT
PACK | CUTANEOUS | Status: AC
  Filled 2022-08-26: qty 10

## 2022-08-26 MED ORDER — ENOXAPARIN SODIUM 40 MG/0.4ML IJ SOSY
40.0000 mg | PREFILLED_SYRINGE | INTRAMUSCULAR | Status: DC
  Administered 2022-08-27: 40 mg via SUBCUTANEOUS
  Filled 2022-08-26: qty 0.4

## 2022-08-26 MED ORDER — PHENYLEPHRINE 80 MCG/ML (10ML) SYRINGE FOR IV PUSH (FOR BLOOD PRESSURE SUPPORT)
PREFILLED_SYRINGE | INTRAVENOUS | Status: AC
  Filled 2022-08-26: qty 10

## 2022-08-26 MED ORDER — FENTANYL CITRATE (PF) 100 MCG/2ML IJ SOLN
25.0000 ug | INTRAMUSCULAR | Status: DC | PRN
  Administered 2022-08-26 (×2): 50 ug via INTRAVENOUS

## 2022-08-26 MED ORDER — PHENYLEPHRINE HCL-NACL 20-0.9 MG/250ML-% IV SOLN
INTRAVENOUS | Status: DC | PRN
  Administered 2022-08-26: 20 ug/min via INTRAVENOUS

## 2022-08-26 MED ORDER — CELECOXIB 200 MG PO CAPS
ORAL_CAPSULE | ORAL | Status: AC
  Administered 2022-08-26: 200 mg via ORAL
  Filled 2022-08-26: qty 1

## 2022-08-26 MED ORDER — SODIUM CHLORIDE 0.9 % IV SOLN: INTRAVENOUS | Status: DC

## 2022-08-26 MED ORDER — PHENYLEPHRINE HCL-NACL 20-0.9 MG/250ML-% IV SOLN
INTRAVENOUS | Status: AC
  Filled 2022-08-26: qty 250

## 2022-08-26 MED ORDER — BUPIVACAINE LIPOSOME 1.3 % IJ SUSP
INTRAMUSCULAR | Status: DC | PRN
  Administered 2022-08-26: 20 mL

## 2022-08-26 MED ORDER — KETOROLAC TROMETHAMINE 15 MG/ML IJ SOLN
15.0000 mg | Freq: Four times a day (QID) | INTRAMUSCULAR | Status: DC
  Administered 2022-08-26 – 2022-08-27 (×3): 15 mg via INTRAVENOUS
  Filled 2022-08-26 (×3): qty 1

## 2022-08-26 MED ORDER — FENTANYL CITRATE (PF) 100 MCG/2ML IJ SOLN
INTRAMUSCULAR | Status: DC | PRN
  Administered 2022-08-26: 50 ug via INTRAVENOUS
  Administered 2022-08-26 (×2): 25 ug via INTRAVENOUS
  Administered 2022-08-26 (×2): 50 ug via INTRAVENOUS

## 2022-08-26 MED ORDER — LABETALOL HCL 5 MG/ML IV SOLN
INTRAVENOUS | Status: AC
  Administered 2022-08-26: 5 mg via INTRAVENOUS
  Filled 2022-08-26: qty 4

## 2022-08-26 MED ORDER — ACETAMINOPHEN 500 MG PO TABS
1000.0000 mg | ORAL_TABLET | Freq: Four times a day (QID) | ORAL | Status: DC
  Administered 2022-08-26 – 2022-08-27 (×3): 1000 mg via ORAL
  Filled 2022-08-26 (×3): qty 2

## 2022-08-26 MED ORDER — FENTANYL CITRATE (PF) 100 MCG/2ML IJ SOLN
INTRAMUSCULAR | Status: AC
  Filled 2022-08-26: qty 2

## 2022-08-26 MED ORDER — CHLORHEXIDINE GLUCONATE 0.12 % MT SOLN
OROMUCOSAL | Status: AC
  Administered 2022-08-26: 15 mL via OROMUCOSAL
  Filled 2022-08-26: qty 15

## 2022-08-26 MED ORDER — IRBESARTAN 150 MG PO TABS
75.0000 mg | ORAL_TABLET | Freq: Every day | ORAL | Status: DC
  Administered 2022-08-26 – 2022-08-27 (×2): 75 mg via ORAL
  Filled 2022-08-26 (×2): qty 1

## 2022-08-26 MED ORDER — DEXAMETHASONE SODIUM PHOSPHATE 10 MG/ML IJ SOLN
INTRAMUSCULAR | Status: DC | PRN
  Administered 2022-08-26: 5 mg via INTRAVENOUS

## 2022-08-26 MED ORDER — BUPIVACAINE LIPOSOME 1.3 % IJ SUSP
INTRAMUSCULAR | Status: AC
  Filled 2022-08-26: qty 20

## 2022-08-26 MED ORDER — ROCURONIUM BROMIDE 10 MG/ML (PF) SYRINGE
PREFILLED_SYRINGE | INTRAVENOUS | Status: AC
  Filled 2022-08-26: qty 10

## 2022-08-26 MED ORDER — ONDANSETRON HCL 4 MG/2ML IJ SOLN
INTRAMUSCULAR | Status: AC
  Filled 2022-08-26: qty 4

## 2022-08-26 MED ORDER — TORSEMIDE 20 MG PO TABS
40.0000 mg | ORAL_TABLET | Freq: Every day | ORAL | Status: DC
  Administered 2022-08-27: 40 mg via ORAL
  Filled 2022-08-26: qty 2

## 2022-08-26 MED ORDER — SEVOFLURANE IN SOLN
RESPIRATORY_TRACT | Status: AC
  Filled 2022-08-26: qty 250

## 2022-08-26 SURGICAL SUPPLY — 60 items
APPLICATOR VISTASEAL 35 (MISCELLANEOUS) IMPLANT
BLADE SURG 15 STRL LF DISP TIS (BLADE) ×2 IMPLANT
BLADE SURG 15 STRL SS (BLADE) ×2
CANNULA REDUC XI 12-8 STAPL (CANNULA) ×2
CANNULA REDUCER 12-8 DVNC XI (CANNULA) ×2 IMPLANT
DERMABOND ADVANCED .7 DNX12 (GAUZE/BANDAGES/DRESSINGS) ×2 IMPLANT
DRAPE 3/4 80X56 (DRAPES) ×2 IMPLANT
DRAPE ARM DVNC X/XI (DISPOSABLE) ×8 IMPLANT
DRAPE COLUMN DVNC XI (DISPOSABLE) ×2 IMPLANT
DRAPE DA VINCI XI ARM (DISPOSABLE) ×8
DRAPE DA VINCI XI COLUMN (DISPOSABLE) ×2
ELECT CAUTERY BLADE 6.4 (BLADE) ×2 IMPLANT
ELECT REM PT RETURN 9FT ADLT (ELECTROSURGICAL) ×2
ELECTRODE REM PT RTRN 9FT ADLT (ELECTROSURGICAL) ×2 IMPLANT
GLOVE BIO SURGEON STRL SZ7 (GLOVE) ×6 IMPLANT
GOWN STRL REUS W/ TWL LRG LVL3 (GOWN DISPOSABLE) ×8 IMPLANT
GOWN STRL REUS W/TWL LRG LVL3 (GOWN DISPOSABLE) ×8
GRASPER LAPSCPC 5X45 DSP (INSTRUMENTS) ×2 IMPLANT
IRRIGATION STRYKERFLOW (MISCELLANEOUS) IMPLANT
IRRIGATOR STRYKERFLOW (MISCELLANEOUS) ×2
IV NS 1000ML (IV SOLUTION)
IV NS 1000ML BAXH (IV SOLUTION) IMPLANT
KIT PINK PAD W/HEAD ARE REST (MISCELLANEOUS) ×2
KIT PINK PAD W/HEAD ARM REST (MISCELLANEOUS) ×2 IMPLANT
KIT TURNOVER CYSTO (KITS) ×2 IMPLANT
LABEL OR SOLS (LABEL) ×2 IMPLANT
MANIFOLD NEPTUNE II (INSTRUMENTS) ×2 IMPLANT
MESH BIO-A 7X10 SYN MAT (Mesh General) IMPLANT
NEEDLE HYPO 22GX1.5 SAFETY (NEEDLE) ×2 IMPLANT
OBTURATOR OPTICAL STANDARD 8MM (TROCAR) ×2
OBTURATOR OPTICAL STND 8 DVNC (TROCAR) ×2
OBTURATOR OPTICALSTD 8 DVNC (TROCAR) ×2 IMPLANT
PACK LAP CHOLECYSTECTOMY (MISCELLANEOUS) ×2 IMPLANT
PENCIL SMOKE EVACUATOR (MISCELLANEOUS) ×2 IMPLANT
SEAL CANN UNIV 5-8 DVNC XI (MISCELLANEOUS) ×6 IMPLANT
SEAL XI 5MM-8MM UNIVERSAL (MISCELLANEOUS) ×6
SEALER VESSEL DA VINCI XI (MISCELLANEOUS) ×2
SEALER VESSEL EXT DVNC XI (MISCELLANEOUS) ×2 IMPLANT
SOLUTION ELECTROLUBE (MISCELLANEOUS) ×2 IMPLANT
SPIKE FLUID TRANSFER (MISCELLANEOUS) ×2 IMPLANT
SPONGE T-LAP 18X18 ~~LOC~~+RFID (SPONGE) ×2 IMPLANT
STAPLER CANNULA SEAL DVNC XI (STAPLE) ×2 IMPLANT
STAPLER CANNULA SEAL XI (STAPLE) ×2
SUT MNCRL 4-0 (SUTURE) ×2
SUT MNCRL 4-0 27XMFL (SUTURE) ×2
SUT SILK 2 0 SH (SUTURE) ×6 IMPLANT
SUT VIC AB 3-0 SH 27 (SUTURE)
SUT VIC AB 3-0 SH 27X BRD (SUTURE) IMPLANT
SUT VICRYL 0 AB UR-6 (SUTURE) ×4 IMPLANT
SUT VLOC 90 S/L VL9 GS22 (SUTURE) ×2 IMPLANT
SUTURE MNCRL 4-0 27XMF (SUTURE) ×2 IMPLANT
SYR 20ML LL LF (SYRINGE) ×2 IMPLANT
SYR 30ML LL (SYRINGE) ×2 IMPLANT
SYS BAG RETRIEVAL 10MM (BASKET) ×2
SYSTEM BAG RETRIEVAL 10MM (BASKET) IMPLANT
TRAP FLUID SMOKE EVACUATOR (MISCELLANEOUS) ×2 IMPLANT
TRAY FOLEY SLVR 16FR LF STAT (SET/KITS/TRAYS/PACK) ×2 IMPLANT
TROCAR XCEL NON-BLD 5MMX100MML (ENDOMECHANICALS) ×2 IMPLANT
TUBING EVAC SMOKE HEATED PNEUM (TUBING) ×2 IMPLANT
WATER STERILE IRR 500ML POUR (IV SOLUTION) ×2 IMPLANT

## 2022-08-26 NOTE — Discharge Instructions (Signed)
In addition to included general post-operative instructions,  Diet: Recommend following Nissen diet recommendations for at least 4 weeks. You were provided a handout for this.  Activity: No heavy lifting >20 pounds (children, pets, laundry, garbage) or strenuous activity for 4 weeks, but light activity and walking are encouraged. Do not drive or drink alcohol if taking narcotic pain medications or having pain that might distract from driving.  Wound care: 2 days after surgery (09/14), you may shower/get incision wet with soapy water and pat dry (do not rub incisions), but no baths or submerging incision underwater until follow-up.   Medications: Resume all home medications. For mild to moderate pain: acetaminophen (Tylenol) or ibuprofen/naproxen (if no kidney disease). Combining Tylenol with alcohol can substantially increase your risk of causing liver disease. Narcotic pain medications, if prescribed, can be used for severe pain, though may cause nausea, constipation, and drowsiness. Do not combine Tylenol and Percocet (or similar) within a 6 hour period as Percocet (and similar) contain(s) Tylenol. If you do not need the narcotic pain medication, you do not need to fill the prescription.  Call office [phone]) at any time if any questions, worsening pain, fevers/chills, bleeding, drainage from incision site, or other concerns.

## 2022-08-26 NOTE — Progress Notes (Signed)
Notified patient family that she is doing well. Resting comfortably in bed. Waiting for a room assignment.

## 2022-08-26 NOTE — Interval H&P Note (Signed)
History and Physical Interval Note:  08/26/2022 7:17 AM  Tonya [name]  has presented today for surgery, with the diagnosis of Paraesophageal hernia.  The various methods of treatment have been discussed with the patient and family. After consideration of risks, benefits and other options for treatment, the patient has consented to  Procedure(s): XI ROBOTIC ASSISTED PARAESOPHAGEAL HERNIA REPAIR, RNFA to assist (N/A) as a surgical intervention.  The patient's history has been reviewed, patient examined, no change in status, stable for surgery.  I have reviewed the patient's chart and labs.  Questions were answered to the patient's satisfaction.     [place]

## 2022-08-26 NOTE — Anesthesia Procedure Notes (Signed)
Procedure Name: Intubation Date/Time: 08/26/2022 7:40 AM  Performed by: Lily Peer, Cristin Penaflor, CRNAPre-anesthesia Checklist: Patient identified, Emergency Drugs available, Suction available and Patient being monitored Patient Re-evaluated:Patient Re-evaluated prior to induction Oxygen Delivery Method: Circle system utilized Preoxygenation: Pre-oxygenation with 100% oxygen Induction Type: IV induction Ventilation: Mask ventilation without difficulty Laryngoscope Size: McGraph and 3 Grade View: Grade I Tube type: Oral Tube size: 7.0 mm Number of attempts: 1 Airway Equipment and Method: Stylet Placement Confirmation: ETT inserted through vocal cords under direct vision, positive ETCO2 and breath sounds checked- equal and bilateral Secured at: 19 cm Tube secured with: Tape Dental Injury: Teeth and Oropharynx as per pre-operative assessment

## 2022-08-26 NOTE — Op Note (Signed)
Robotic assisted laparoscopic  repair of  paraesophageal hernia and Nissen fundoplication   Pre-operative Diagnosis: GERD, paraesophageal hernia type III, sarcoidosis  Post-operative Diagnosis: same  Procedure:  Robotic assisted laparoscopic  repair of  paraesophageal hernia and Nissen fundoplication   Surgeon: Caroleen Hamman, MD FACS  Assistant: Gladstone Lighter RNFA. Required due to the complexity of the case the need for exposure and lack of first assist.  Anesthesia: Gen. with endotracheal tube  Findings: Large Type III paraesophageal hernia  Loose wrap 360 degree over 50 FR Bougie Chronic inflammatory changes on mediastinum with LAD c/w sarcoidosis  Estimated Blood Loss: 15cc       Specimens: Sac           Complications: none   Procedure Details  The patient was seen again in the Holding Room. The benefits, complications, treatment options, and expected outcomes were discussed with the patient. The risks of bleeding, infection, recurrence of symptoms, failure to resolve symptoms,  esophageal damage, Dysphagia, bowel injury, any of which could require further surgery were reviewed with the patient. The likelihood of improving the patient's symptoms with return to their baseline status is good.  The patient and/or family concurred with the proposed plan, giving informed consent.  The patient was taken to Operating Room, identified  and the procedure verified.  A Time Out was held and the above information confirmed.  Prior to the induction of general anesthesia, antibiotic prophylaxis was administered. VTE prophylaxis was in place. General endotracheal anesthesia was then administered and tolerated well. After the induction, the abdomen was prepped with Chloraprep and draped in the sterile fashion. The patient was positioned in the supine position.  Cut down technique was used to enter the abdominal cavity and a Hasson trochar was placed after two vicryl stitches were anchored to the  fascia. Pneumoperitoneum was then created with CO2 and tolerated well without any adverse changes in the patient's vital signs.  Three 8-mm ports were placed under direct vision. All skin incisions  were infiltrated with a local anesthetic agent before making the incision and placing the trocars. An additional 5 mm regular laparoscopic port was placed to assist with retraction and exposure.   The patient was positioned  in reverse Trendelenburg, robot was brought to the surgical field and docked in the standard fashion.  We made sure all the instrumentation was kept indirect view at all times and that there were no collision between the arms. I scrubbed out and went to the console.  I used a robtic arm to retract the liver, the vessel sealer on my right hand and a forced bipolar grasper on my left hand.  There is along the extra 5 mm port allow me ample exposure and the ability to perform meticulous dissection  We Started dividing the lesser omentum via the pars flaccida.  We Were able to dissect the lesser curvature of the stomach and  dissected the fundus free from the right and left crus.  We circumferentially dissected the GE junction.  The hernia sac was also completely reduced and we were able to bring the stomach into the intra-abdominal position. Attention then was turned to the greater curvature where the short gastrics were divided with sealer device.  We were able to identify the left crus and again were able to make sure there was a good circumferential dissection and that the hernia sac was completely excised.    We did perform a good dissection within the mediastinum to allow a complete reduction of  the sac and a to completely allow an intra-abdominal Nissen fundoplication with sufficient esophageal length.There was significant lymphadenopathy and chronic inflammatory changes in the mediastinum c/w sarcoidosis.  The crus was approximated using 2-0V lock suture using two strips of Bio-A as  pledgets. A bio-A mesh was also placed to reinforce the repair and fixed with vistaseal in the standard fashion.  We Asked anesthesia to place a 50 French bougie and this went easily.  We also observe trajectory of the bougie. 360 degree Nissen fundoplication was created with multiple 2-0 silk sutures and we placed 3 stitches taking some of the esophagus within that bite.  The fundoplication measured approximately 3-1/2 cm and he was floppy. I was very happy with the way the fundoplication laid and the repair of the hernia.   Inspection of the  upper quadrant was performed. No bleeding, bile  Or esophageal injuries leaks, or bowel injuries were noted. The hernia sac was excised and removed. Robotic instruments and robotic arms were undocked in the standard fashion. All the needles were removed under direct visualization.   I scrubbed back in.  Pneumoperitoneum was released.  The periumbilical port site was closed with interrumpted 0 Vicryl sutures. 4-0 subcuticular Monocryl was used to close the skin. Liposomal marcaine was injected to all the incisions sites.  Dermabond was  applied.  The patient was then extubated and brought to the recovery room in stable condition. Sponge, lap, and needle counts were correct at closure and at the conclusion of the case.               Caroleen Hamman, MD, FACS

## 2022-08-26 NOTE — Transfer of Care (Signed)
Immediate Anesthesia Transfer of Care Note  Patient: Tonya [name]  Procedure(s) Performed: XI ROBOTIC ASSISTED PARAESOPHAGEAL HERNIA REPAIR, RNFA to assist INSERTION OF MESH  Patient Location: PACU  Anesthesia Type:General  Level of Consciousness: awake and alert   Airway & Oxygen Therapy: Patient Spontanous Breathing and Patient connected to face mask oxygen  Post-op Assessment: Report given to RN and Post -op Vital signs reviewed and stable  Post vital signs: Reviewed and stable  Last Vitals:  Vitals Value Taken Time  BP 192/78 08/26/22 1022  Temp    Pulse 79 08/26/22 1025  Resp 17 08/26/22 1025  SpO2 100 % 08/26/22 1025  Vitals shown include unvalidated device data.  Last Pain:  Vitals:   08/26/22 0645  PainSc: 0-No pain         Complications: No notable events documented.

## 2022-08-26 NOTE — Anesthesia Postprocedure Evaluation (Signed)
Anesthesia Post Note  Patient: Tonya [name]  Procedure(s) Performed: XI ROBOTIC ASSISTED PARAESOPHAGEAL HERNIA REPAIR, RNFA to assist INSERTION OF MESH  Patient location during evaluation: PACU Anesthesia Type: General Level of consciousness: awake and alert Pain management: pain level controlled Vital Signs Assessment: post-procedure vital signs reviewed and stable Respiratory status: spontaneous breathing, nonlabored ventilation, respiratory function stable and patient connected to nasal cannula oxygen Cardiovascular status: blood pressure returned to baseline and stable Postop Assessment: no apparent nausea or vomiting Anesthetic complications: no   No notable events documented.   Last Vitals:  Vitals:   08/26/22 1130 08/26/22 1145  BP: (!) 165/76 (!) 151/75  Pulse: 73 65  Resp: 14 11  Temp:    SpO2: 99% 100%    Last Pain:  Vitals:   08/26/22 1145  PainSc: Asleep                 Ilene Qua

## 2022-08-26 NOTE — Plan of Care (Signed)
  Problem: Health Behavior/Discharge Planning: Goal: Ability to manage health-related needs will improve Outcome: Progressing   Problem: Skin Integrity: Goal: Risk for impaired skin integrity will decrease Outcome: Progressing   Problem: Clinical Measurements: Goal: Cardiovascular complication will be avoided Outcome: Progressing   Problem: Activity: Goal: Risk for activity intolerance will decrease Outcome: Progressing   Problem: Pain Managment: Goal: General experience of comfort will improve Outcome: Progressing   Problem: Safety: Goal: Ability to remain free from injury will improve Outcome: Progressing

## 2022-08-26 NOTE — Anesthesia Preprocedure Evaluation (Addendum)
Anesthesia Evaluation  Patient identified by MRN, date of birth, ID band Patient awake    Reviewed: Allergy & Precautions, NPO status , Patient's Chart, lab work & pertinent test results  History of Anesthesia Complications Negative for: history of anesthetic complications  Airway Mallampati: II  TM Distance: >3 FB     Dental  (+) Missing,    Pulmonary neg pulmonary ROS,    breath sounds clear to auscultation       Cardiovascular METS (Patient physical activity limited 2/2 back pain, spends most days in a chair ): 3 - Mets hypertension, Pt. on medications (-) angina+CHF (HFpEF: Echo 11/2019 EF 60-65%, mod LVH, Grade 1 diastolic dysfunction )  (-) DOE  Rhythm:Regular Rate:Normal     Neuro/Psych  Neuromuscular disease CVA, No Residual Symptoms negative psych ROS   GI/Hepatic Neg liver ROS, hiatal hernia, GERD  ,  Endo/Other  diabetes (A1c (07/2022): 8.5% ), Poorly Controlled, Type 2  Renal/GU Renal disease  negative genitourinary   Musculoskeletal  (+) Arthritis , Sarcoidosis  Patient recently fell and reports swelling in her L foot and ankle     Abdominal Normal abdominal exam  (+)   Peds  Hematology  (+) Blood dyscrasia, anemia ,   Anesthesia Other Findings   Reproductive/Obstetrics                           Anesthesia Physical Anesthesia Plan  ASA: 3  Anesthesia Plan: General   Post-op Pain Management: Tylenol PO (pre-op)* and Celebrex PO (pre-op)*   Induction: Intravenous  PONV Risk Score and Plan: 3 and Dexamethasone, Ondansetron and Treatment may vary due to age or medical condition  Airway Management Planned: Oral ETT  Additional Equipment:   Intra-op Plan:   Post-operative Plan: Extubation in OR  Informed Consent: I have reviewed the patients History and Physical, chart, labs and discussed the procedure including the risks, benefits and alternatives for the proposed  anesthesia with the patient or authorized representative who has indicated his/her understanding and acceptance.     Dental advisory given  Plan Discussed with: Anesthesiologist, CRNA and Surgeon  Anesthesia Plan Comments: (Patient consented for risks of anesthesia including but not limited to:  - adverse reactions to medications - damage to eyes, teeth, lips or other oral mucosa - nerve damage due to positioning  - sore throat or hoarseness - Damage to heart, brain, nerves, lungs, other parts of body or loss of life  Patient voiced understanding.)       Anesthesia Quick Evaluation

## 2022-08-27 ENCOUNTER — Encounter: Payer: Self-pay | Admitting: Surgery

## 2022-08-27 DIAGNOSIS — I5030 Unspecified diastolic (congestive) heart failure: Secondary | ICD-10-CM | POA: Diagnosis not present

## 2022-08-27 DIAGNOSIS — K449 Diaphragmatic hernia without obstruction or gangrene: Secondary | ICD-10-CM | POA: Diagnosis not present

## 2022-08-27 DIAGNOSIS — Z8673 Personal history of transient ischemic attack (TIA), and cerebral infarction without residual deficits: Secondary | ICD-10-CM | POA: Diagnosis not present

## 2022-08-27 DIAGNOSIS — D869 Sarcoidosis, unspecified: Secondary | ICD-10-CM | POA: Diagnosis not present

## 2022-08-27 DIAGNOSIS — K219 Gastro-esophageal reflux disease without esophagitis: Secondary | ICD-10-CM | POA: Diagnosis not present

## 2022-08-27 DIAGNOSIS — E1122 Type 2 diabetes mellitus with diabetic chronic kidney disease: Secondary | ICD-10-CM | POA: Diagnosis not present

## 2022-08-27 DIAGNOSIS — N183 Chronic kidney disease, stage 3 unspecified: Secondary | ICD-10-CM | POA: Diagnosis not present

## 2022-08-27 DIAGNOSIS — Z7901 Long term (current) use of anticoagulants: Secondary | ICD-10-CM | POA: Diagnosis not present

## 2022-08-27 DIAGNOSIS — Z7982 Long term (current) use of aspirin: Secondary | ICD-10-CM | POA: Diagnosis not present

## 2022-08-27 DIAGNOSIS — I130 Hypertensive heart and chronic kidney disease with heart failure and stage 1 through stage 4 chronic kidney disease, or unspecified chronic kidney disease: Secondary | ICD-10-CM | POA: Diagnosis not present

## 2022-08-27 LAB — BASIC METABOLIC PANEL
Anion gap: 5 (ref 5–15)
BUN: 61 mg/dL — ABNORMAL HIGH (ref 8–23)
CO2: 21 mmol/L — ABNORMAL LOW (ref 22–32)
Calcium: 8.2 mg/dL — ABNORMAL LOW (ref 8.9–10.3)
Chloride: 118 mmol/L — ABNORMAL HIGH (ref 98–111)
Creatinine, Ser: 2.65 mg/dL — ABNORMAL HIGH (ref 0.44–1.00)
GFR, Estimated: 18 mL/min — ABNORMAL LOW (ref >60)
Glucose, Bld: 77 mg/dL (ref 70–99)
Potassium: 4.5 mmol/L (ref 3.5–5.1)
Sodium: 144 mmol/L (ref 135–145)

## 2022-08-27 LAB — CBC
HCT: 29.4 % — ABNORMAL LOW (ref 36.0–46.0)
Hemoglobin: 9 g/dL — ABNORMAL LOW (ref 12.0–15.0)
MCH: 27.1 pg (ref 26.0–34.0)
MCHC: 30.6 g/dL (ref 30.0–36.0)
MCV: 88.6 fL (ref 80.0–100.0)
Platelets: 237 10*3/uL (ref 150–400)
RBC: 3.32 MIL/uL — ABNORMAL LOW (ref 3.87–5.11)
RDW: 15.4 % (ref 11.5–15.5)
WBC: 6.6 10*3/uL (ref 4.0–10.5)
nRBC: 0 % (ref 0.0–0.2)

## 2022-08-27 LAB — GLUCOSE, CAPILLARY
Glucose-Capillary: 205 mg/dL — ABNORMAL HIGH (ref 70–99)
Glucose-Capillary: 83 mg/dL (ref 70–99)

## 2022-08-27 LAB — SURGICAL PATHOLOGY

## 2022-08-27 MED ORDER — OXYCODONE HCL 5 MG PO TABS: 5.0000 mg | ORAL_TABLET | Freq: Four times a day (QID) | ORAL | 0 refills | Status: DC | PRN

## 2022-08-27 MED ORDER — IBUPROFEN 600 MG PO TABS: 600.0000 mg | ORAL_TABLET | Freq: Four times a day (QID) | ORAL | 0 refills | Status: DC | PRN

## 2022-08-27 NOTE — Addendum Note (Signed)
Addendum  created 08/27/22 0955 by Norm Salt, CRNA   Clinical Note Signed

## 2022-08-27 NOTE — Discharge Summary (Signed)
[facility] SURGICAL DISCHARGE SUMMARY  Patient ID: [id number] Tonya [name] MRN: [id number] DOB/AGE: 1946-07-15 76 y.o.  Admit date: 08/26/2022 Discharge date: 08/27/2022  Discharge Diagnoses Patient Active Problem List   Diagnosis Date Noted   S/P repair of paraesophageal hernia 08/26/2022   Hiatal hernia 07/26/2022    Consultants None  Procedures 08/26/2022: Robotic assisted laparoscopic paraesophageal hernia repair with Nissen Fundoplication   HPI: Tonya [name] is a 76 y.o. female with history of paraesophageal hernia who presents to [facility] on 09/12 for scheduled repair.   Hospital Course: Informed consent was obtained and documented, and patient underwent uneventful robotic assisted laparoscopic paraesophageal hernia repair with Nissen fundoplication (Dr Dahlia Byes, [id number]/01/5851).  Post-operatively, patient did well. She does have hoarseness of her voice; present before surgery; not worsening. Seen by anesthesia on POD1. Advancement of patient's diet and ambulation were well-tolerated. The remainder of patient's hospital course was essentially unremarkable, and discharge planning was initiated accordingly with patient safely able to be discharged home with appropriate discharge instructions, pain control, and outpatient follow-up after all of her and family's questions were answered to their expressed satisfaction.   Discharge Condition: Good   Physical Examination:  Constitutional: Well appearing female, NAD Pulmonary: Normal effort; hoarseness to voice which was present pre-op; no distress Gastrointestinal: Soft, incisional soreness expectedly, non-distended ,no rebound/guarding  Skin: Laparoscopic incisions are CDI with dermabond, no erythema or drainage    Allergies as of 08/27/2022       Reactions   Atorvastatin    Myalgias   Gabapentin Other (See Comments)   Speech impairment    Pravastatin    Myalgias   Pregabalin    Speech impairment   Trulicity  [dulaglutide] Hives        Medication List     TAKE these medications    acetaminophen 650 MG CR tablet Commonly known as: TYLENOL Take 650 mg by mouth every 8 (eight) hours as needed for pain.   aspirin 81 MG tablet Take 81 mg by mouth daily.   carvedilol 3.125 MG tablet Commonly known as: COREG TAKE 1 TABLET BY MOUTH TWICE  DAILY WITH MEALS   clopidogrel 75 MG tablet Commonly known as: PLAVIX TAKE 1 TABLET BY MOUTH  DAILY   docusate sodium 100 MG capsule Commonly known as: COLACE Take 100 mg by mouth daily as needed for mild constipation.   HumaLOG KwikPen 100 UNIT/ML KwikPen Generic drug: insulin lispro 6-10 Units See admin instructions. Take 10 units before morning meal. Take 8 units before the afternoon and evening snacks.   ibuprofen 600 MG tablet Commonly known as: ADVIL Take 1 tablet (600 mg total) by mouth every 6 (six) hours as needed.   methocarbamol 500 MG tablet Commonly known as: ROBAXIN Take 1 tablet by mouth every morning.   NovoFine 32G X 6 MM Misc Generic drug: Insulin Pen Needle USE AS DIRECTED THREE TIMES DAILY WITH HUMALOG   olmesartan 40 MG tablet Commonly known as: BENICAR Take 1 tablet (40 mg total) by mouth daily. What changed: when to take this   omeprazole 40 MG capsule Commonly known as: PRILOSEC Take 1 capsule (40 mg total) by mouth in the morning and at bedtime. What changed: when to take this   oxyCODONE 5 MG immediate release tablet Commonly known as: Oxy IR/ROXICODONE Take 1 tablet (5 mg total) by mouth every 6 (six) hours as needed for severe pain or breakthrough pain.   pioglitazone 30 MG tablet Commonly known as: ACTOS Take 30 mg by  mouth daily.   rosuvastatin 5 MG tablet Commonly known as: CRESTOR Take 1 tablet (5 mg total) by mouth daily. What changed: when to take this   torsemide 20 MG tablet Commonly known as: DEMADEX Take 2 tablets (40 mg total) by mouth daily. What changed: when to take this   vitamin  B-12 100 MCG tablet Commonly known as: CYANOCOBALAMIN Take 100 mcg by mouth daily.   vitamin D3 50 MCG (2000 UT) Caps Take 2,000 Units by mouth daily.          Follow-up Information     P[place]on, [place] F, MD. Schedule an appointment as soon as possible for a visit in 3 week(s).   Specialty: General Surgery Why: s/p paraesophageal hernia repair Contact information: [address] [place] [address] [phone]                  Time spent on discharge management including discussion of hospital course, clinical condition, outpatient instructions, prescriptions, and follow up with the patient and members of the medical team: >30 minutes  -- Edison Simon , PA-C [facility]  08/27/2022, 10:41 AM [phone] M-F: 7am - 4pm

## 2022-08-27 NOTE — TOC Initial Note (Signed)
Transition of Care [facility]) - Initial/Assessment Note    Patient Details  Name: Tonya [name] MRN: [id number] Date of Birth: Sep 02, 1946  Transition of Care [facility]) CM/SW Contact:    Beverly Sessions, RN Phone Number: 08/27/2022, 10:07 AM  Clinical Narrative:                   Transition of Care [facility]) Screening Note   Patient Details  Name: Tonya [name] Date of Birth: 15-Jun-1946   Transition of Care [facility]) CM/SW Contact:    Beverly Sessions, RN Phone Number: 08/27/2022, 10:07 AM    Transition of Care Department (TOC) has reviewed patient and no TOC needs have been identified at this time. We will continue to monitor patient advancement through interdisciplinary progression rounds. If new patient transition needs arise, please place a TOC consult.         Patient Goals and CMS Choice        Expected Discharge Plan and Services                                                Prior Living Arrangements/Services                       Activities of Daily Living Home Assistive Devices/Equipment: Cane (specify quad or straight), Walker (specify type) ADL Screening (condition at time of admission) Patient's cognitive ability adequate to safely complete daily activities?: Yes Is the patient deaf or have difficulty hearing?: No Does the patient have difficulty seeing, even when wearing glasses/contacts?: No Does the patient have difficulty concentrating, remembering, or making decisions?: No Patient able to express need for assistance with ADLs?: Yes Does the patient have difficulty dressing or bathing?: No Independently performs ADLs?: Yes (appropriate for developmental age) Does the patient have difficulty walking or climbing stairs?: Yes Weakness of Legs: None Weakness of Arms/Hands: None  Permission Sought/Granted                  Emotional Assessment              Admission diagnosis:  S/P repair of paraesophageal hernia  [O87.867, Z87.19] Patient Active Problem List   Diagnosis Date Noted   S/P repair of paraesophageal hernia 08/26/2022   Hiatal hernia 07/26/2022   Anemia in stage 4 chronic kidney disease ([place]) 04/15/2022   H/O sarcoidosis 01/29/2022   Ganglion cyst of wrist, left 01/29/2022   Lumbar degenerative disc disease 06/07/2020   Lumbar spondylosis 06/07/2020   Chronic pain syndrome 04/18/2020   Sacroiliac joint pain 04/18/2020   Chronic heart failure with preserved ejection fraction (HFpEF) ([place]) 01/11/2020   Grade I diastolic dysfunction 67/20/9470   History of CVA (cerebrovascular accident) 12/01/2019   Hyperparathyroidism due to renal insufficiency ([place]) 08/31/2019   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[place]) 09/22/2017   Advanced care planning/counseling discussion 04/13/2017   Post herpetic neuralgia 10/10/2016   Hip bursitis 09/18/2015   Poorly controlled type 2 diabetes mellitus with neuropathy ([place]) 05/01/2015   Hyperlipidemia associated with type 2 diabetes mellitus ([place]) 05/01/2015   Chronic kidney disease, stage 4, severely decreased GFR ([place]) 05/01/2015   Encounter for long-term (current) use of insulin ([place]) 05/01/2015   PCP:  [name], NP Pharmacy:   [facility] [place], [address] [place] [address] Phone: [phone] Fax: [phone]  [facility] ([place], [place] [facility] [address] [place] [address] [place] [address] Phone: [phone] Fax: [place] [facility] ([facility]) - [place], [place] [address] Phone: [phone] Fax: [phone]     Social Determinants of Health (SDOH) Interventions    Readmission Risk Interventions     No data to display

## 2022-08-27 NOTE — Care Management Obs Status (Signed)
[place] NOTIFICATION   Patient Details  Name: Tonya [name] MRN: [id number] Date of Birth: Aug 18, 1946   Medicare Observation Status Notification Given:  Yes    Beverly Sessions, RN 08/27/2022, 11:12 AM

## 2022-08-27 NOTE — Progress Notes (Signed)
Pt complaining of hoarseness after procedure. Upon assessment pt states she was experiencing hoarseness prior to procedure but it is now worse than before. Pt denies pain or trouble swallowing and is able to clear airway with an adequate cough. Worsened hoarseness likely due to irritation from having ETT for surgery. Pt instructed that this should get better on its own in a few days, however if hoarseness gets worse or does not improve to reach out to anesthesia or her PCP for follow up.

## 2022-09-02 ENCOUNTER — Ambulatory Visit: Payer: Medicare Other | Admitting: Student in an Organized Health Care Education/Training Program

## 2022-09-10 ENCOUNTER — Other Ambulatory Visit: Payer: Self-pay

## 2022-09-10 ENCOUNTER — Ambulatory Visit (INDEPENDENT_AMBULATORY_CARE_PROVIDER_SITE_OTHER): Payer: Medicare Other | Admitting: Surgery

## 2022-09-10 ENCOUNTER — Encounter: Payer: Self-pay | Admitting: Surgery

## 2022-09-10 VITALS — BP 110/73 | HR 105 | Temp 98.6°F | Wt 154.8 lb

## 2022-09-10 DIAGNOSIS — Z09 Encounter for follow-up examination after completed treatment for conditions other than malignant neoplasm: Secondary | ICD-10-CM

## 2022-09-10 DIAGNOSIS — K449 Diaphragmatic hernia without obstruction or gangrene: Secondary | ICD-10-CM

## 2022-09-10 NOTE — Patient Instructions (Signed)
If you have any concerns or questions, please feel free to call our office.   Eating Plan After Nissen Fundoplication After a Nissen fundoplication procedure, it is common to have some difficulty swallowing. The part of your body that moves food and liquid from your mouth to your stomach (esophagus) will be swollen and may feel tight. It will take several weeks or months for your esophagus and stomach to heal. By following a special eating plan, you can prevent problems such as pain, swelling or pressure in the abdomen (bloating), gas, nausea, or diarrhea. What are tips for following this plan? Cooking Cook all foods until they are soft. Remove skins and seeds from fruits and vegetables before eating. Remove skin and gristle from meats. Grind or finely mince meats before eating. Avoid over-cooking meat. Dry, tough meat is more difficult to swallow. Avoid using oil when cooking, or use only a small amount of oil. Avoid using seasoning when cooking, or use only a small amount of seasoning. Toast bread before eating. This makes it easier to swallow. Meal planning  Eat 6-8 small meals throughout the day. Right after the surgery, have a few meals that are only clear liquids. Clear liquids include: Water. Clear fruit juice, no pulp. Chicken, beef, or vegetable broth. Gelatin. Decaffeinated tea or coffee without milk. Popsicles or shaved ice. Depending on your progress, you may move to a full liquid diet as told by your health care provider. This includes clear liquids and the following: Dairy and alternative milks, such as soy milk. Strained creamed soups. Ice cream or sherbet. Pudding. Nutritional supplement drinks. Yogurt. A few days after surgery, you may be able to start eating a diet of soft foods. You may need to eat according to this plan for several weeks. Do not eat sweets or sweetened drinks at the beginning of a meal. Doing that may cause your stomach to empty faster than it  should (dumping syndrome). Lifestyle Always sit upright when eating or drinking. Eat slowly. Take small bites and chew food well before swallowing. Do not lie down after eating. Stay sitting up for 30 minutes or longer after each meal. Sip fluids between meals. Limit how much you drink at one time. With meals and snacks, have 4-8 oz (120-240 mL). This is equal to  cup-1 cup. Do not mix solid foods and liquids in the same mouthful. Drink enough fluid to keep your urine pale yellow. Do not chew gum or drink fluids through a straw. Doing those things may cause you to swallow extra air. General information Do not drink carbonated drinks or alcohol. Avoid foods and drinks that contain caffeine and chocolate. Avoid foods and drinks that contain citrus or tomato. Allow hot soups and drinks to cool before eating. Avoid foods that cause gas, such as beans, peas, broccoli, or cabbage. If dairy milk products cause diarrhea, avoid them or eat them in small amounts. Recommended foods Fruits Any soft-cooked fruits after skins and seeds are removed. Fruit juice. Vegetables Any soft-cooked vegetables after skins and seeds are removed. Vegetable juice. Grains Cooked cereals. Dry cereals softened with liquid. Cooked pasta, rice, or other grains. Toasted bread. Bland crackers, such as soda or graham crackers. Meats and other protein foods Tender cuts of meat, poultry, or fish after bones, skin, and gristle are removed. Poached, boiled, or scrambled eggs. Canned fish. Tofu. Creamy nut butters. Dairy Milk. Yogurt. Cottage cheese. Mild cheeses. Beverages Nutritional supplement drinks. Decaffeinated tea or coffee. Sports drinks. Fats and oils Butter. Margarine.   Mayonnaise. Vegetable oil. Smooth salad dressing. Sweets and desserts Plain hard candy. Marshmallows. Pudding. Ice cream. Gelatin. Sherbet. Seasoning and other foods Salt. Light seasonings. Mustard. Vinegar. The items listed above may not be a  complete list of recommended foods and beverages. Contact a dietitian for more information. Foods to avoid Fruits Oranges. Grapefruit. Lemons. Limes. Citrus juices. Dried fruit. Crunchy, raw fruits. Vegetables Tomato sauce. Tomato juice. Broccoli. Cauliflower. Cabbage. Brussels sprouts. Crunchy, raw vegetables. Grains High-fiber or bran cereal. Cereal with nuts, dried fruit, or coconut. Sweet breads, rolls, coffee cake, or donuts. Chewy or crusty breads. Popcorn. Meats and other protein foods Beans, peas, and lentils. Tough or fatty meats. Fried meats, chicken, or fish. Fried eggs. Nuts and seeds. Crunchy nut butters. Dairy Chocolate milk. Yogurt with chunks of fruit, nuts, seeds, or coconut. Strong cheeses. Beverages Carbonated soft drinks. Alcohol. Cocoa. Hot drinks. Fats and oils Bacon fat. Lard. Sweets and desserts Chocolate. Candy with nuts, coconut, or seeds. Peppermint. Cookies. Cakes. Pie crust. Seasoning and other foods Heavy seasonings. Chili sauce. Ketchup. Barbecue sauce. Pickles. Horseradish. The items listed above may not be a complete list of foods and beverages to avoid. Contact a dietitian for more information. Summary Following this eating plan after a Nissen fundoplication is an important part of healing after surgery. After surgery, you will start with a clear liquid diet before you progress to full liquids and soft foods. You may need to eat soft foods for several weeks. Avoid eating foods that cause irritation, gas, nausea, diarrhea, or swelling or pressure in the abdomen (bloating), and avoid foods that are difficult to swallow. Talk with a dietitian about which dietary choices are best for you. This information is not intended to replace advice given to you by your health care provider. Make sure you discuss any questions you have with your health care provider. Document Revised: 06/17/2020 Document Reviewed: 06/17/2020 Elsevier Patient Education  2023 Elsevier  Inc.  

## 2022-09-10 NOTE — Progress Notes (Signed)
Tonya [name] is 2 weeks out from paraesophageal hernia repair.  She is doing well.  No fevers no chills.  She is walking. She is tolerating p.o.  She had some constipation likely related to narcotics No dysphagia  PE: NAD Abd: soft, nt, incisions c/d/I  A/P DOing well w/o complications Conitnue Nissen diet RTC2-3 motnhs

## 2022-09-11 ENCOUNTER — Ambulatory Visit: Payer: Medicare Other | Admitting: Gastroenterology

## 2022-09-11 NOTE — Progress Notes (Deleted)
[name] MD, MRCP(U.K) [address]  [place]  [place], [place] [address]  Main: [phone]  Fax: [phone]   Primary Care Physician: [name], NP  Primary Gastroenterologist:  Dr. [name]   No chief complaint on file.   HPI: Tonya [name] is a 76 y.o. female   Summary of history :   Initially referred and seen by Dr. Patsey Berthold in June 2023 for abnormal CT scan of the abdomen.  She follows with Dr. [name] in hematology for anemia.  Chronic in nature.  Ongoing since 2017.  Previously received IV iron.  She is on Plavix.  Has stage IV CKD.  Follows with Dr. Patsey Berthold for sarcoidosis.  Found to have a very large hiatal hernia and evidence of a dilated esophagus with food all the way to the throat and was recommended to see [place] .   04/09/2022 CT chest without contrast shows no evidence of sarcoidosis, large hiatal hernia with food material within the esophagus the level of the thoracic inlet endoscopy recommended.  04/15/2022 hemoglobin 8.5 g MCV 88.  Ferritin 14 iron studies 01/16/2020 colonoscopy showed a 3 mm polyp in the ascending colon that was infected diverticulosis of the colon.Polyp was benign in nature.   She denies any dysphagia her main complaint is heartburn usually when she lays flat.  Takes Prilosec 20 mg a day usually after she eats.  Denies any other complaints.  Has had history of heartburn for more than a year.  She usually lays flat within 2 hours of dinnertime.  Does not use a wedge pillow.   Interval history 06/09/2022-09/11/2022   06/23/2022: EGD: Large hiatal hernia was noted normal esophagus 08/26/2022 underwent surgery for paraesophageal hernia doing well no other complaints.  ***  Current Outpatient Medications  Medication Sig Dispense Refill   acetaminophen (TYLENOL) 650 MG CR tablet Take 650 mg by mouth every 8 (eight) hours as needed for pain.     aspirin 81 MG tablet Take 81 mg by mouth daily.     carvedilol (COREG) 3.125 MG tablet  TAKE 1 TABLET BY MOUTH TWICE  DAILY WITH MEALS 180 tablet 3   clopidogrel (PLAVIX) 75 MG tablet TAKE 1 TABLET BY MOUTH  DAILY 90 tablet 4   docusate sodium (COLACE) 100 MG capsule Take 100 mg by mouth daily as needed for mild constipation.     HUMALOG KWIKPEN 100 UNIT/ML KiwkPen 6-10 Units See admin instructions. Take 10 units before morning meal. Take 8 units before the afternoon and evening snacks.  11   ibuprofen (ADVIL) 600 MG tablet Take 1 tablet (600 mg total) by mouth every 6 (six) hours as needed. 30 tablet 0   methocarbamol (ROBAXIN) 500 MG tablet Take 1 tablet by mouth every morning.     naloxone (NARCAN) nasal spray 4 mg/0.1 mL SMARTSIG:Both Nares     NOVOFINE 32G X 6 MM MISC USE AS DIRECTED THREE TIMES DAILY WITH HUMALOG 100 each 3   olmesartan (BENICAR) 40 MG tablet Take 1 tablet (40 mg total) by mouth daily. (Patient taking differently: Take 40 mg by mouth every morning.) 90 tablet 4   omeprazole (PRILOSEC) 40 MG capsule Take 1 capsule (40 mg total) by mouth in the morning and at bedtime. (Patient taking differently: Take 40 mg by mouth every morning.) 180 capsule 0   oxyCODONE (OXY IR/ROXICODONE) 5 MG immediate release tablet Take 1 tablet (5 mg total) by mouth every 6 (six) hours as needed for severe pain  or breakthrough pain. 20 tablet 0   pioglitazone (ACTOS) 30 MG tablet Take 30 mg by mouth daily.     rosuvastatin (CRESTOR) 5 MG tablet Take 1 tablet (5 mg total) by mouth daily. (Patient taking differently: Take 5 mg by mouth every morning.) 90 tablet 3   torsemide (DEMADEX) 20 MG tablet Take 2 tablets (40 mg total) by mouth daily. (Patient taking differently: Take 40 mg by mouth every morning.) 180 tablet 3   vitamin B-12 (CYANOCOBALAMIN) 100 MCG tablet Take 100 mcg by mouth daily.     Vitamin D, Cholecalciferol, 50 MCG (2000 UT) CAPS Take 2,000 Units by mouth daily.     No current facility-administered medications for this visit.    Allergies as of 09/11/2022 - Review Complete  09/10/2022  Allergen Reaction Noted   Atorvastatin  09/03/2017   Gabapentin Other (See Comments) 06/30/2018   Pravastatin  09/03/2017   Pregabalin  28/41/3244   Trulicity [dulaglutide] Hives 05/01/2015    ROS:  General: Negative for anorexia, weight loss, fever, chills, fatigue, weakness. ENT: Negative for hoarseness, difficulty swallowing , nasal congestion. CV: Negative for chest pain, angina, palpitations, dyspnea on exertion, peripheral edema.  Respiratory: Negative for dyspnea at rest, dyspnea on exertion, cough, sputum, wheezing.  GI: See history of present illness. GU:  Negative for dysuria, hematuria, urinary incontinence, urinary frequency, nocturnal urination.  Endo: Negative for unusual weight change.    Physical Examination:   LMP  (LMP Unknown)   General: Well-nourished, well-developed in no acute distress.  Eyes: No icterus. Conjunctivae pink. Mouth: Oropharyngeal mucosa moist and pink , no lesions erythema or exudate. Lungs: Clear to auscultation bilaterally. Non-labored. Heart: Regular rate and rhythm, no murmurs rubs or gallops.  Abdomen: Bowel sounds are normal, nontender, nondistended, no hepatosplenomegaly or masses, no abdominal bruits or hernia , no rebound or guarding.   Extremities: No lower extremity edema. No clubbing or deformities. Neuro: Alert and oriented x 3.  Grossly intact. Skin: Warm and dry, no jaundice.   Psych: Alert and cooperative, normal mood and affect.   Imaging Studies: No results found.  Assessment and Plan:   Tonya [name] is a 76 y.o. y/o female here to follow-up after recent upper endoscopy which was performed for abnormal appearing esophagus on the CAT scan.  Large hiatal hernia was noted no other abnormality seen underwent successful paraesophageal hernia repair by Dr. Dahlia Byes  in 09/03/2022.  Plan 1.  Stop all PPI use    Dr [name]  MD,MRCP [facility]) Follow up in ***

## 2022-09-15 ENCOUNTER — Telehealth: Payer: Self-pay

## 2022-09-15 NOTE — Progress Notes (Signed)
Chronic Care Management Pharmacy Assistant   Name: Tonya [name]  MRN: [id number] DOB: [birth year]   Reason for Encounter: Disease State-General    Recent office visits:  08/05/22 [name] T, NP (Poorly controlled diabetes) Orders: Labs; Medication changes: insulin Lispro  Recent consult visits:  09/10/22 [name], MD- General Surgery (Follow up surgery) Orders: none; Medication changes: none  08/25/22, 8/9/23[name], [name], MD-Oncology (Chronic Kidney disease) Orders: Labs; Medication changes: none  08/11/22, 07/28/22 [name], MD- General Surgery (Hiatal hernia) Orders:none; Medication changes: none  07/01/22 [name], MD- Cardiology [facility]) Orders: EKG; Medication changes: Carvedilol 3.125 mg, rosuvastatin 5 mg  06/27/22 [name], MD-Pulmonary Disease (Hiatal hernia with GERD) Orders: referral to General Surgery; Medication  changes: none  06/09/22 [name], MD-Gastroenterology (Hernia) Orders: none; Medication changes: omeprazole 40 mg, torsemide 20 mg  Hospital visits:  Medication Reconciliation was completed by comparing discharge summary, patient's EMR and Pharmacy list, and upon discussion with patient.  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 Discharge date was 08/27/22. Discharged from [facility]?Medications Started at [facility] Discharge:?? -started Ibuprofen 600 mg Oxycodone 40 mg  Medications that remain the same after Hospital Discharge:??  -All other medications will remain the same.    Admitted to the hospital on 06/23/22 due to abnormal CT of abdomen. Discharge date was 06/23/22. Discharged from [facility].    Medications that remain the same after Hospital Discharge:??  -All other medications will remain the same.    Medications: Outpatient Encounter Medications as of 09/15/2022  Medication Sig   acetaminophen (TYLENOL) 650 MG CR tablet Take 650  mg by mouth every 8 (eight) hours as needed for pain.   aspirin 81 MG tablet Take 81 mg by mouth daily.   carvedilol (COREG) 3.125 MG tablet TAKE 1 TABLET BY MOUTH TWICE  DAILY WITH MEALS   clopidogrel (PLAVIX) 75 MG tablet TAKE 1 TABLET BY MOUTH  DAILY   docusate sodium (COLACE) 100 MG capsule Take 100 mg by mouth daily as needed for mild constipation.   HUMALOG KWIKPEN 100 UNIT/ML KiwkPen 6-10 Units See admin instructions. Take 10 units before morning meal. Take 8 units before the afternoon and evening snacks.   ibuprofen (ADVIL) 600 MG tablet Take 1 tablet (600 mg total) by mouth every 6 (six) hours as needed.   methocarbamol (ROBAXIN) 500 MG tablet Take 1 tablet by mouth every morning.   naloxone (NARCAN) nasal spray 4 mg/0.1 mL SMARTSIG:Both Nares   NOVOFINE 32G X 6 MM MISC USE AS DIRECTED THREE TIMES DAILY WITH HUMALOG   olmesartan (BENICAR) 40 MG tablet Take 1 tablet (40 mg total) by mouth daily. (Patient taking differently: Take 40 mg by mouth every morning.)   omeprazole (PRILOSEC) 40 MG capsule Take 1 capsule (40 mg total) by mouth in the morning and at bedtime. (Patient taking differently: Take 40 mg by mouth every morning.)   oxyCODONE (OXY IR/ROXICODONE) 5 MG immediate release tablet Take 1 tablet (5 mg total) by mouth every 6 (six) hours as needed for severe pain or breakthrough pain.   pioglitazone (ACTOS) 30 MG tablet Take 30 mg by mouth daily.   rosuvastatin (CRESTOR) 5 MG tablet Take 1 tablet (5 mg total) by mouth daily. (Patient taking differently: Take 5 mg by mouth every morning.)   torsemide (DEMADEX) 20 MG tablet Take 2 tablets (40 mg total) by mouth daily. (Patient taking differently: Take 40  mg by mouth every morning.)   vitamin B-12 (CYANOCOBALAMIN) 100 MCG tablet Take 100 mcg by mouth daily.   Vitamin D, Cholecalciferol, 50 MCG (2000 UT) CAPS Take 2,000 Units by mouth daily.   No facility-administered encounter medications on file as of 09/15/2022.   Contacted [name] for General Review Call   Recent Relevant Labs: Lab Results  Component Value Date/Time   HGBA1C 8.1 (H) 08/26/2022 04:13 PM   HGBA1C 8.2 (H) 01/29/2021 11:05 AM   HGBA1C 7.9 (H) 10/25/2020 11:24 AM   HGBA1C 7.6 07/09/2020 12:00 AM   HGBA1C 8.5 03/28/2020 12:00 AM   MICROALBUR 150 (H) 01/29/2021 11:05 AM   MICROALBUR 150 (H) 04/10/2020 11:34 AM    Kidney Function Lab Results  Component Value Date/Time   CREATININE 2.65 (H) 08/27/2022 04:42 AM   CREATININE 2.53 (H) 08/26/2022 04:13 PM   GFRNONAA 18 (L) 08/27/2022 04:42 AM   GFRAA 20 (L) 01/29/2021 11:10 AM   Unsuccessful outbound call made today to assist with:  Diabetic Assessment Call.  Outreach Attempt:  3rd Attempt, to reach patient.  A HIPAA compliant voice message was left requesting a return call.  Instructed patient to call back at  earliest convenience.  Current antihyperglycemic regimen:  Humalog 100 units Take 10 units before breakfast and 8 units before afternoon and evening snacks Pioglitazone 30 mg take 1 tab daily  What recent interventions/DTPs have been made to improve glycemic control:  Continue current medication regimen and defer changes to endo.  Cardiology and PCP have recommended to endo discontinuing Actos due to her HF.  Continue to collaborate with Dr. [name] and CCM team. Recommend she monitor BS at least 3 times a day at home, continue consistent [place] use.  Return in 6 months as is being followed closely by endo. Per [name] 08/05/22.  Have there been any recent hospitalizations or ED visits since last visit with CPP? Yes, 08/26/22 Hiatal hernia   Adherence Review: Is the patient currently on a STATIN medication? Yes Is the patient currently on ACE/ARB medication? Yes Does the patient have >5 day gap between last estimated fill dates? No   Care Gaps: Colonoscopy-01/16/20 Diabetic Foot Exam-08/05/22 Mammogram-01/07/21 Ophthalmology-06/06/22 Dexa Scan - 03/11/17 Annual Well Visit -  11/12/21 Micro albumin-11/22/21 Hemoglobin A1c- 08/26/22 (8.1)  Star Rating Drugs: Olmesartan 40 mg-last fill 08/08/22 90 , 05/24/22 90 ds Pioglitazone 30 mg-last fill 7/14/2 90 ds, 04/03/22 90 ds Rosuvastatin 5 mg-last fill 08/08/22 90 ds, 07/07/22 90 ds   [place] [phone]

## 2022-09-17 DIAGNOSIS — E113312 Type 2 diabetes mellitus with moderate nonproliferative diabetic retinopathy with macular edema, left eye: Secondary | ICD-10-CM | POA: Diagnosis not present

## 2022-09-18 ENCOUNTER — Telehealth: Payer: Self-pay | Admitting: Surgery

## 2022-09-18 NOTE — Telephone Encounter (Signed)
Error/bmv

## 2022-09-22 ENCOUNTER — Inpatient Hospital Stay: Payer: Medicare Other | Attending: Oncology

## 2022-09-22 ENCOUNTER — Inpatient Hospital Stay: Payer: Medicare Other

## 2022-10-13 ENCOUNTER — Ambulatory Visit (INDEPENDENT_AMBULATORY_CARE_PROVIDER_SITE_OTHER): Payer: Medicare Other

## 2022-10-13 DIAGNOSIS — E1140 Type 2 diabetes mellitus with diabetic neuropathy, unspecified: Secondary | ICD-10-CM

## 2022-10-13 DIAGNOSIS — E1169 Type 2 diabetes mellitus with other specified complication: Secondary | ICD-10-CM

## 2022-10-13 DIAGNOSIS — I130 Hypertensive heart and chronic kidney disease with heart failure and stage 1 through stage 4 chronic kidney disease, or unspecified chronic kidney disease: Secondary | ICD-10-CM

## 2022-10-13 NOTE — Progress Notes (Signed)
Chronic Care Management Pharmacy Note  10/13/2022 Name:  Tonya [name] MRN:  [id number] DOB:  1946-03-31  Summary: -Pleasant 76 year old female presents for f/u CCM visit. States she has no hobbies. Wasn't open to talking about it  Recommendations/Changes made from today's visit: -Will call endo to ask to DC Pioglitazone  Subjective: Tonya [name] is an 76 y.o. year old female who is a primary patient of Cannady, [name], NP.  The CCM team was consulted for assistance with disease management and care coordination needs.    Engaged with patient by telephone for follow up visit in response to provider referral for pharmacy case management and/or care coordination services.   Consent to Services:  The patient was given the following information about Chronic Care Management services today, agreed to services, and gave verbal consent: 1. CCM service includes personalized support from designated clinical staff supervised by the primary care provider, including individualized plan of care and coordination with other care providers 2. 24/7 contact phone numbers for assistance for urgent and routine care needs. 3. Service will only be billed when office clinical staff spend 20 minutes or more in a month to coordinate care. 4. Only one practitioner may furnish and bill the service in a calendar month. 5.The patient may stop CCM services at any time (effective at the end of the month) by phone call to the office staff. 6. The patient will be responsible for cost sharing (co-pay) of up to 20% of the service fee (after annual deductible is met). Patient agreed to services and consent obtained.  Patient Care Team: [name], NP as PCP - General (Nurse Practitioner) [name], MD as PCP - Cardiology (Cardiology) Solum, Betsey Holiday, MD as Physician Assistant (Endocrinology) Lavonia Dana, MD as Consulting Physician (Nephrology) [name], MD as Consulting Physician  (Cardiology) Pa, [facility] (Optometry)    Objective:  Lab Results  Component Value Date   CREATININE 2.65 (H) 08/27/2022   BUN 61 (H) 08/27/2022   GFRNONAA 18 (L) 08/27/2022   GFRAA 20 (L) 01/29/2021   NA 144 08/27/2022   K 4.5 08/27/2022   CALCIUM 8.2 (L) 08/27/2022   CO2 21 (L) 08/27/2022   GLUCOSE 77 08/27/2022    Lab Results  Component Value Date/Time   HGBA1C 8.1 (H) 08/26/2022 04:13 PM   HGBA1C 8.2 (H) 01/29/2021 11:05 AM   HGBA1C 7.9 (H) 10/25/2020 11:24 AM   HGBA1C 7.6 07/09/2020 12:00 AM   HGBA1C 8.5 03/28/2020 12:00 AM   MICROALBUR 150 (H) 01/29/2021 11:05 AM   MICROALBUR 150 (H) 04/10/2020 11:34 AM    Last diabetic Eye exam:  Lab Results  Component Value Date/Time   HMDIABEYEEXA Retinopathy (A) 06/06/2022 12:00 AM    Last diabetic Foot exam: No results found for: "HMDIABFOOTEX"   Lab Results  Component Value Date   CHOL 177 08/05/2022   HDL 76 08/05/2022   LDLCALC 83 08/05/2022   LDLDIRECT 67 12/30/2018   TRIG 101 08/05/2022   CHOLHDL 2.5 12/02/2019       Latest Ref Rng & Units 04/15/2022    1:34 PM 02/12/2021    5:06 PM 12/02/2019    9:12 AM  Hepatic Function  Total Protein 6.5 - 8.1 g/dL 6.3  6.6  5.8   Albumin 3.5 - 5.0 g/dL 3.2  3.4  3.1   AST 15 - 41 U/L _0 ALT 0 - 44 U/L 12  12  14   Alk Phosphatase 38 - 126 U/L 57  62  73   Total Bilirubin 0.3 - 1.2 mg/dL 0.6  0.6  0.6     Lab Results  Component Value Date/Time   TSH 2.040 08/05/2022 02:37 PM   TSH 3.110 01/29/2021 11:10 AM       Latest Ref Rng & Units 08/27/2022    4:42 AM 08/26/2022    4:13 PM 08/21/2022   11:51 AM  CBC  WBC 4.0 - 10.5 K/uL 6.6  8.9  4.9   Hemoglobin 12.0 - 15.0 g/dL 9.0  10.6  10.7   Hematocrit 36.0 - 46.0 % 29.4  35.1  34.2   Platelets 150 - 400 K/uL 237  269  268     Lab Results  Component Value Date/Time   VD25OH 24.4 (L) 08/05/2022 02:37 PM   VD25OH 23.9 (L) 01/29/2022 01:35 PM    Clinical ASCVD: Yes  The 10-year ASCVD risk score  (Arnett DK, et al., 2019) is: 25.6%   Values used to calculate the score:     Age: 23 years     Sex: Female     Is Non-Hispanic African American: Yes     Diabetic: Yes     Tobacco smoker: No     Systolic Blood Pressure: 749 mmHg     Is BP treated: Yes     HDL Cholesterol: 76 mg/dL     Total Cholesterol: 177 mg/dL       01/29/2022    1:19 PM 11/12/2021   11:11 AM 09/02/2021    9:34 AM  Depression screen PHQ 2/9  Decreased Interest 0 0 0  Down, Depressed, Hopeless 0 0 0  PHQ - 2 Score 0 0 0  Altered sleeping 1    Tired, decreased energy 0    Change in appetite 2    Feeling bad or failure about yourself  1    Trouble concentrating 0    Moving slowly or fidgety/restless 0    Suicidal thoughts 0    PHQ-9 Score 4    Difficult doing work/chores Not difficult at all       Other: (CHADS2VASc if Afib, MMRC or CAT for COPD, ACT, DEXA)  Social History   Tobacco Use  Smoking Status Never  Smokeless Tobacco Never   BP Readings from Last 3 Encounters:  09/10/22 110/73  08/27/22 125/67  08/25/22 135/75   Pulse Readings from Last 3 Encounters:  09/10/22 (!) 105  08/27/22 83  08/25/22 99   Wt Readings from Last 3 Encounters:  09/10/22 154 lb 12.8 oz (70.2 kg)  08/26/22 161 lb (73 kg)  08/25/22 161 lb 8 oz (73.3 kg)   BMI Readings from Last 3 Encounters:  09/10/22 24.25 kg/m  08/26/22 25.22 kg/m  08/25/22 25.29 kg/m    Assessment/Interventions: Review of patient past medical history, allergies, medications, health status, including review of consultants reports, laboratory and other test data, was performed as part of comprehensive evaluation and provision of chronic care management services.   SDOH:  (Social Determinants of Health) assessments and interventions performed: Yes SDOH Interventions    Flowsheet Row Chronic Care Management from 10/13/2022 in [place] from 11/12/2021 in [place] Management from  09/24/2021 in [place] from 07/30/2020 in [place] Management from 04/20/2020 in [place] Interventions       Food Insecurity Interventions -- Intervention Not Indicated Intervention  Not Indicated -- --  Housing Interventions -- Intervention Not Indicated Intervention Not Indicated -- --  Transportation Interventions Intervention Not Indicated Intervention Not Indicated Intervention Not Indicated -- --  Depression Interventions/Treatment  -- -- -- PHQ2-9 Score <4 Follow-up Not Indicated --  Financial Strain Interventions Intervention Not Indicated Intervention Not Indicated Intervention Not Indicated -- --  Physical Activity Interventions -- Intervention Not Indicated Other (Comments)  [no structured activity] -- Other (Comments)  [the patient does not do structured activity]  Stress Interventions -- Intervention Not Indicated Other (Comment)  [a little stressed over her pain level in her back and not being relieved] -- --  Social Connections Interventions -- Intervention Not Indicated Intervention Not Indicated -- --      SDOH Screenings   Food Insecurity: No Food Insecurity (11/12/2021)  Housing: Low Risk  (11/12/2021)  Transportation Needs: No Transportation Needs (10/13/2022)  Alcohol Screen: Low Risk  (11/12/2021)  Depression (PHQ2-9): Low Risk  (01/29/2022)  Financial Resource Strain: Low Risk  (10/13/2022)  Physical Activity: Inactive (11/12/2021)  Social Connections: Socially Integrated (11/12/2021)  Stress: No Stress Concern Present (11/12/2021)  Tobacco Use: Low Risk  (09/10/2022)    [place]  Allergies  Allergen Reactions   Atorvastatin     Myalgias    Gabapentin Other (See Comments)    Speech impairment    Pravastatin     Myalgias    Pregabalin     Speech impairment   Trulicity [Dulaglutide] Hives    Medications Reviewed Today     Reviewed by Lane Hacker, [facility]  (Pharmacist) on 10/13/22 at [place] List Status: <None>   Medication Order Taking? Sig Documenting Provider Last Dose Status Informant  acetaminophen (TYLENOL) 650 MG CR tablet [id number] Yes Take 650 mg by mouth every 8 (eight) hours as needed for pain. [provider] Taking Active   aspirin 81 MG tablet [id number] Yes Take 81 mg by mouth daily. [provider] Taking Active Self  carvedilol (COREG) 3.125 MG tablet [id number] Yes TAKE 1 TABLET BY MOUTH TWICE  DAILY WITH MEALS Gollan, Kathlene November, MD Taking Active   clopidogrel (PLAVIX) 75 MG tablet [id number] Yes TAKE 1 TABLET BY MOUTH  DAILY Cannady, [name] T, NP Taking Active   docusate sodium (COLACE) 100 MG capsule [id number]  Take 100 mg by mouth daily as needed for mild constipation. [provider]  Active   HUMALOG KWIKPEN 100 UNIT/ML Mayer Masker [id number] Yes 6-10 Units See admin instructions. Take 10 units before morning meal. Take 8 units before the afternoon and evening snacks. [provider] Taking Active Child           Med Note Burnadette Peter, Chrystie Nose   Fri Apr 19, 2021  7:55 AM)    ibuprofen (ADVIL) 600 MG tablet [id number] No Take 1 tablet (600 mg total) by mouth every 6 (six) hours as needed.  Patient not taking: Reported on 10/13/2022   Tylene Fantasia, PA-C Not Taking Consider Medication Status and Discontinue   methocarbamol (ROBAXIN) 500 MG tablet [id number]  Take 1 tablet by mouth every morning. [provider]  Active   naloxone [facility]) nasal spray 4 mg/0.1 mL [id number]  SMARTSIG:Both Nares [provider]  Active   NOVOFINE 32G X 6 MM MISC [id number]  USE AS DIRECTED THREE TIMES DAILY WITH HUMALOG Cannady, [name] T, NP  Active   olmesartan (BENICAR) 40 MG tablet [id number] Yes Take 1 tablet (40 mg total) by mouth daily.  Patient taking differently: Take  40 mg by mouth every morning.   [name] T, NP Taking Active Self  omeprazole (PRILOSEC) 40 MG capsule [id number] No Take 1  capsule (40 mg total) by mouth in the morning and at bedtime.  Patient not taking: Reported on 10/13/2022   [name], MD Not Taking Active   oxyCODONE (OXY IR/ROXICODONE) 5 MG immediate release tablet [id number] No Take 1 tablet (5 mg total) by mouth every 6 (six) hours as needed for severe pain or breakthrough pain.  Patient not taking: Reported on 10/13/2022   Tylene Fantasia, PA-C Not Taking Consider Medication Status and Discontinue   pioglitazone (ACTOS) 30 MG tablet [id number] Yes Take 30 mg by mouth daily. [provider] Taking Active Self  rosuvastatin (CRESTOR) 5 MG tablet [id number] Yes Take 1 tablet (5 mg total) by mouth daily.  Patient taking differently: Take 5 mg by mouth every morning.   [name], MD Taking Active Self  torsemide (DEMADEX) 20 MG tablet [id number] Yes Take 2 tablets (40 mg total) by mouth daily.  Patient taking differently: Take 40 mg by mouth every morning.   [name], MD Taking Active Self  vitamin B-12 (CYANOCOBALAMIN) 100 MCG tablet [id number] Yes Take 100 mcg by mouth daily. [provider] Taking Active   Vitamin D, Cholecalciferol, 50 MCG (2000 UT) CAPS [id number] Yes Take 2,000 Units by mouth daily. [provider] Taking Active   Med List Note Dewayne Shorter, RN 04/18/20 1437): MR 06-17-2020 UDS 04-18-2020 Med agreement signed 04-18-2020            Patient Active Problem List   Diagnosis Date Noted   S/P repair of paraesophageal hernia 08/26/2022   Hiatal hernia 07/26/2022   Anemia in stage 4 chronic kidney disease ([place]) 04/15/2022   H/O sarcoidosis 01/29/2022   Ganglion cyst of wrist, left 01/29/2022   Lumbar degenerative disc disease 06/07/2020   Lumbar spondylosis 06/07/2020   Chronic pain syndrome 04/18/2020   Sacroiliac joint pain 04/18/2020   Chronic heart failure with preserved ejection fraction (HFpEF) ([place]) 01/11/2020   Grade I diastolic dysfunction 20/09/711   History of CVA (cerebrovascular  accident) 12/01/2019   Hyperparathyroidism due to renal insufficiency (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[place]) 09/22/2017   Advanced care planning/counseling discussion 04/13/2017   Post herpetic neuralgia 10/10/2016   Hip bursitis 09/18/2015   Poorly controlled type 2 diabetes mellitus with neuropathy ([place]) 05/01/2015   Hyperlipidemia associated with type 2 diabetes mellitus ([place]) 05/01/2015   Chronic kidney disease, stage 4, severely decreased GFR ([place]) 05/01/2015   Encounter for long-term (current) use of insulin ([place]) 05/01/2015    Immunization History  Administered Date(s) Administered   Fluad Quad(high Dose 65+) 09/30/2019, 01/29/2021, 10/29/2021   Influenza, High Dose Seasonal PF 09/10/2016, 11/16/2018   Influenza,inj,Quad PF,6+ Mos 09/18/2015   Influenza-Unspecified 09/18/2015, 09/08/2017   Moderna Sars-Covid-2 Vaccination 02/15/2020, 03/07/2020, 09/14/2020   PPD Test 07/15/2016, 07/29/2016   Pneumococcal Conjugate-13 06/07/2014   Pneumococcal Polysaccharide-23 09/07/2013   Td 06/12/2005   Td (Adult), 2 Lf Tetanus Toxid, Preservative Free 06/12/2005   Tdap 10/10/2016   Zoster, Live 09/13/2013    Conditions to be addressed/monitored:  Hypertension, Hyperlipidemia, and Diabetes  Care Plan : [place] plan  Updates made by Lane Hacker, [place] since 10/13/2022 12:00 AM     Problem: Disease State Management      Long-Range Goal: DM, HTN, Cholesterol   Start Date: 10/13/2022  Expected  End Date: 10/13/2023  This Visit's Progress: On track  Priority: High  Note:   Current Barriers:  Does not contact provider office for questions/concerns  Pharmacist Clinical Goal(s):  Patient will achieve adherence to monitoring guidelines and medication adherence to achieve therapeutic efficacy through collaboration with PharmD and provider.   Interventions: 1:1 collaboration with [name], NP regarding development and  update of comprehensive plan of care as evidenced by provider attestation and co-signature Inter-disciplinary care team collaboration (see longitudinal plan of care) Comprehensive medication review performed; medication list updated in electronic medical record  Heart Failure (Goal: manage symptoms and prevent exacerbations) -Controlled -Last ejection fraction:  Echocardiogram December 2020  ejection fraction 60 to 65% -HF type: Diastolic -NYHA Class: III (marked limitation of activity) -AHA HF Stage: C (Heart disease and symptoms present) -Current treatment: Carvedilol 3.182m BID Appropriate, Effective, Safe, Accessible Olmesartan 479mAppropriate, Effective, Safe, Accessible Torsemide 403mppropriate, Effective, Safe, Accessible Pioglitazone Query Appropriate,  -Medications previously tried: N/A  -Current home BP/HR readings: Doesn't test -Current dietary habits: "Tries to eat healthy" -Current exercise habits: None -Educated on Benefits of medications for managing symptoms and prolonging life October 2023: Will ask Endo to DC pioglitazone   Hyperlipidemia: (LDL goal < 70) The 10-year ASCVD risk score (Arnett DK, et al., 2019) is: 25.6%   Values used to calculate the score:     Age: 64 94ars     Sex: Female     Is Non-Hispanic African American: Yes     Diabetic: Yes     Tobacco smoker: No     Systolic Blood Pressure: 110338Hg     Is BP treated: Yes     HDL Cholesterol: 76 mg/dL     Total Cholesterol: 177 mg/dL Lab Results  Component Value Date   CHOL 177 08/05/2022   CHOL 176 01/29/2022   CHOL 172 10/29/2021   Lab Results  Component Value Date   HDL 76 08/05/2022   HDL 85 01/29/2022   HDL 70 10/29/2021   Lab Results  Component Value Date   LDLCALC 83 08/05/2022   LDLCALC 76 01/29/2022   LDLCALC 87 10/29/2021   Lab Results  Component Value Date   TRIG 101 08/05/2022   TRIG 84 01/29/2022   TRIG 83 10/29/2021   Lab Results  Component Value Date    CHOLHDL 2.5 12/02/2019   CHOLHDL 2.0 12/30/2018   Lab Results  Component Value Date   LDLDIRECT 67 12/30/2018  Last vitamin D Lab Results  Component Value Date   VD25OH 24.4 (L) 08/05/2022   Lab Results  Component Value Date   TSH 2.040 08/05/2022  -Not ideally controlled -Current treatment: Rosuvastatin 5mg78m Appropriate, Effective, Safe, Accessible -Medications previously tried: Atorvastatin  -Current dietary patterns: "Tries to eat healthy" -Current exercise habits: None -Educated on Cholesterol goals;  October 2023: Would like to get patient off renal statin and on liver one. However, documented Hx of myalgias and Rosuvastatin dose is WNL -Recommended to continue current medication  Diabetes (A1c goal <8%) Lab Results  Component Value Date   HGBA1C 8.1 (H) 08/26/2022   HGBA1C 8.2 (H) 01/29/2021   HGBA1C 7.9 (H) 10/25/2020   Lab Results  Component Value Date   MICROALBUR 150 (H) 01/29/2021   LDLCALC 83 08/05/2022   CREATININE 2.65 (H) 08/27/2022   Lab Results  Component Value Date   NA 144 08/27/2022   K 4.5 08/27/2022   CREATININE 2.65 (H) 08/27/2022   GFRNONAA 18 (L) 08/27/2022  GLUCOSE 77 08/27/2022   Lab Results  Component Value Date   WBC 6.6 08/27/2022   HGB 9.0 (L) 08/27/2022   HCT 29.4 (L) 08/27/2022   MCV 88.6 08/27/2022   PLT 237 08/27/2022   Lab Results  Component Value Date   MICROALBUR 150 (H) 01/29/2021   MICROALBUR 150 (H) 04/10/2020  -Uncontrolled -Current medications: Humalog 6-10 units TID Appropriate, Query effective,  Patient states she's very compliant Pioglitazone Query Appropriate,  -Medications previously tried: N/A  -Current home glucose readings fasting glucose:  October 2023: Unable to view CGM on my computer post prandial glucose:  -Denies hypoglycemic/hyperglycemic symptoms -Current exercise: None -Educated on A1c and blood sugar goals; -Counseled to check feet daily and get yearly eye exams October 2023: Will  ask Endo to stop Pioglitazone. Their note mentioned GLP1 as an option but unable to due to cost. Will ask if still an option and if so, CCM team will do PAP  Patient Goals/Self-Care Activities Patient will:  - take medications as prescribed as evidenced by patient report and record review  Follow Up Plan: The patient has been provided with contact information for the care management team and has been advised to call with any health related questions or concerns.   April 2024 F/U CPP  [place] Constable, Pharm.D. - [phone]        Medication Assistance: None required.  Patient affirms current coverage meets needs.    Patient's preferred pharmacy is:  [id number] [address], [address] AT [address] [place] [address] Phone: [phone] Fax: [phone]  [facility] ([place], [place] [facility] [address] [place] [address] [place] [address] Phone: [phone] Fax: [place], [address] [place] [address] Phone: [phone] Fax: [phone]  Uses pill box? Yes Pt endorses 100% compliance  We discussed: Current pharmacy is preferred with insurance plan and patient is satisfied with pharmacy services Patient decided to: Continue current medication management strategy  Care Plan and Follow Up Patient Decision:  Patient agrees to Care Plan and Follow-up.  Plan: The patient has been provided with contact information for the care management team and has been advised to call with any health related questions or concerns.   CPP F/U April 2024  [place] Constable, Sherian Rein.D. - [phone]

## 2022-10-13 NOTE — Patient Instructions (Signed)
Visit Information   Goals Addressed   None    Patient Care Plan: RNCM: Heart Failure (Adult)  Completed 10/29/2021   Problem Identified: RNCM: Symptom Exacerbation (Heart Failure) Resolved 10/29/2021  Priority: Medium     Long-Range Goal: RNCM: Symptom Exacerbation Prevented or Minimized Completed 10/29/2021  Start Date: 02/15/2021  Expected End Date: 06/03/2022  Recent Progress: On track  Priority: Medium  Note:   Current Barriers: Resolving, duplicate goal  Knowledge deficits related to basic heart failure pathophysiology and self care management Lacks social connections Does not contact provider office for questions/concerns Lack of scale in home Financial strain Nurse Case Manager Clinical Goal(s):  patient will weigh self daily and record patient will verbalize understanding of Heart Failure Action Plan and when to call doctor patient will take all Heart Failure mediations as prescribed Interventions:  Collaboration with [name], NP regarding development and update of comprehensive plan of care as evidenced by provider attestation and co-signature Inter-disciplinary care team collaboration (see longitudinal plan of care) Basic overview and discussion of pathophysiology of Heart Failure Provided written and verbal education on low sodium diet. 07-23-2021: Endorses compliance with heart healthy/ADA diet  Reviewed Heart Failure Action Plan in depth and provided written copy Assessed for scales in home- has scales Discussed importance of daily weight Reviewed role of diuretics in prevention of fluid overload Evaluation of HF. 09-24-2021: The patient states she is doing well. Denies any exacerbations of HF at this time. Is compliant with the poc, medications and dietary restrictions. Will continue to monitor.  Patient Goals/Self-Care Activities:  Takes Heart Failure Medications as prescribed. 09-24-2021: Compliant with medications  Weighs daily and record (notifying MD  of 3 lb weight gain over night or 5 lb in a week) Verbalizes understanding of and follows CHF Action Plan Adheres to low sodium diet  - Take Heart Failure Medications as prescribed - Weigh daily and record (notify MD with 3 lb weight gain over night or 5 lb in a week) - Follow CHF Action Plan - Adhere to low sodium diet - barriers to lifestyle changes reviewed and addressed - barriers to treatment reviewed and addressed - cognitive screening completed and reviewed - depression screen reviewed - health literacy screening completed or reviewed - healthy lifestyle promoted - medication-adherence assessment completed - rescue (action) plan developed - rescue (action) plan reviewed - self-awareness of signs/symptoms of worsening disease encouraged Follow Up Plan: Telephone follow up appointment with care management team member scheduled for: 11-26-2021 at 145pm    Task: RNCM: Identify and Minimize Risk of Heart Failure Exacerbation Completed 07/23/2021  Outcome: Positive  Note:   Care Management Activities:    - barriers to lifestyle changes reviewed and addressed - barriers to treatment reviewed and addressed - cognitive screening completed and reviewed - depression screen reviewed - health literacy screening completed or reviewed - healthy lifestyle promoted - medication-adherence assessment completed - rescue (action) plan developed - rescue (action) plan reviewed - self-awareness of signs/symptoms of worsening disease encouraged        Patient Care Plan: RNCM: Chronic Pain (Adult)  Completed 10/29/2021   Problem Identified: RNCM: Pain Management Plan (Chronic Pain) Resolved 10/29/2021  Priority: High  Onset Date: 02/12/2021     Long-Range Goal: RNCM: Pain Management Plan Developed Completed 10/29/2021  Start Date: 02/12/2021  Expected End Date: 07/03/2022  Recent Progress: On track  Priority: High  Note:   Current Barriers: Resolving, duplicate goal  Knowledge Deficits  related to managing acute/chronic pain Non-adherence to  scheduled provider appointments Non-adherence to prescribed medication regimen Difficulty obtaining medications Chronic Disease Management support and education needs related to chronic pain Unable to independently manage pain and discomfort to lower back radiating down left leg Lacks social connections Unable to perform IADLs independently Does not contact provider office for questions/concerns Nurse Case Manager Clinical Goal(s):  patient will verbalize understanding of plan for managing pain patient will attend all scheduled medical appointments: 10-29-2021 at 1020 am patient will demonstrate use of different relaxation  skills and/or diversional activities to assist with pain reduction (distraction, imagery, relaxation, massage, acupressure, TENS, heat, and cold application patient will report pain at a level less than 3 to 4 on a 10-10 rating scale patient will use pharmacological and nonpharmacological pain relief strategies patient will verbalize acceptable level of pain relief and ability to engage in desired activities patient will engage in desired activities without an increase in pain level Interventions:  Collaboration with [name] T, NP regarding development and update of comprehensive plan of care as evidenced by provider attestation and co-signature Inter-disciplinary care team collaboration (see longitudinal plan of care) - careful application of heat or ice encouraged - deep breathing, relaxation and mindfulness use promoted - effectiveness of pharmacologic therapy monitored - medication-induced side effects managed - misuse of pain medication assessed - motivation and barriers to change assessed and addressed - mutually acceptable comfort goal set - pain assessed. 04-05-2021: The patient states her pain level is less than a 3. Procedure has worked well for her. She is having tingling in her legs but will  discuss with the specialist at upcoming appointment on 04-09-2021. 07-23-2021: The patient denies any tingling in her legs. That has resolved. She is still having issues with back pain and discomfort. It bothers her the most when she is in the kitchen cooking or washing dishes. Discussed pacing activity. She states she rest well at night if she takes a Tylenol Arthritis for pain management. Encouraged the patient to discuss options with the pcp at upcoming appointment on 07-29-2021. 09-24-2021: The patient states she had an injection on 07-16-2021 and it was not effective for long. She is supposed to hear from the pain specialist on 10-07-2021 and will discuss further options. Rates her pain level at a 1 today but currently was sitting down and not moving around. She states that is was "calm". Would like to have something that works for it. She does take Tylenol arthritis when needed for pain. States the pain does wake her from sleep if she turns over a certain way. Education and support given.  - pain treatment goals reviewed. 04-05-2021: The patient states she is feeling better. She is thankful the procedure has been helpful. Will see the specialist next week. 07-23-2021: Would like to know recommendations for effective management of back pain and discomfort. 09-24-2021: Is working with the pain specialist. Will discuss further options for relief of chronic back pain on 10-07-2021.  - premedication prior to activity encouraged Evaluation of current treatment plan related to pain in back going down left leg and patient's adherence to plan as established by provider. 07-23-2021: Pain in legs with tingling resolved. The patient still having issues with back pain. Sees pcp next week. Will collaborate with the pcp for recommendations for the patient with effective management of her back pain. 09-24-2021: Is working with the specialist to help with a plan of care for chronic back pain and discomfort.  Advised patient to call  the office for worsening sx/sx of pain  or changes noted  Provided education to patient re: alternative pain relief measures. The patient tried heat application last night but states it was not helpful. States the pain is going down into her left leg from her back. 09-24-2021: States her pain is "calm" today and rates at a 1.  Reviewed medications with patient and discussed compliance. The patient is taking a prednisone dose pack and Robaxin. She feels the Robaxin or the prednisone is not helping. 09-24-2021: The patient is taking Tylenol arthritis for back pain and this is effective in helping her rest at night.  Collaborated with pcp regarding the patient states she can not see the pain specialist until 03-05-2021, ask about getting a "shot", will discuss with pcp for recommendations. 04-05-2021: Had procedure and is doing well post procedure. Will continue to monitor. 07-23-2021: Follow up with the pcp on 07-29-2021, the patient will discuss her pain concerns with the pcp. 09-24-2021: Will follow up with the pain specialist on 10-07-2021 and with the pcp on 10-29-2021. Discussed plans with patient for ongoing care management follow up and provided patient with direct contact information for care management team Allow patient to maintain a diary of pain ratings, timing, precipitating events, medications, treatments, and what works best to relieve pain,  Refer to support groups and self-help groups Educate patient about the use of pharmacological interventions for pain management- antianxiety, antidepressants, NSAIDS, opioid analgesics,  Explain the importance of lifestyle modifications to effective pain management  Patient Goals/Self Care Activities:  - mutually acceptable comfort goal set - pain assessed - pain management plan developed - pain treatment goals reviewed - patient response to treatment assessed - sharing of pain management plan with teachers and other caregivers encouraged Self-administers  medications as prescribed Attends all scheduled provider appointments Calls pharmacy for medication refills Calls provider office for new concerns or questions Follow Up Plan: Telephone follow up appointment with care management team member scheduled for: 11-26-2021 at 145 pm      Task: RNCM: Partner to Develop Chronic Pain Management Plan Completed 07/23/2021  Outcome: Positive  Note:   Care Management Activities:    - mutually acceptable comfort goal set - pain assessed - pain management plan developed - pain treatment goals reviewed - patient response to treatment assessed - sharing of pain management plan with teachers and other caregivers encouraged        Patient Care Plan: RNCM: Hypertension (Adult)  Completed 10/29/2021   Problem Identified: RNCM: Hypertension (Hypertension) Resolved 10/29/2021  Priority: Medium     Long-Range Goal: RNCM: Hypertension Monitored Completed 10/29/2021  Start Date: 02/15/2021  Expected End Date: 06/13/2022  Recent Progress: On track  Priority: Medium  Note:   Objective: Resolving, duplicate goal  Last practice recorded BP readings:  BP Readings from Last 3 Encounters:  09/02/21 (!) 151/82  08/08/21 (!) 167/89  07/29/21 140/82     Most recent eGFR/CrCl: No results found for: EGFR  No components found for: CRCL Current Barriers:  Knowledge Deficits related to basic understanding of hypertension pathophysiology and self care management Knowledge Deficits related to understanding of medications prescribed for management of hypertension Limited Social Support Lacks social connections Unable to perform IADLs independently Does not contact provider office for questions/concerns Case Manager Clinical Goal(s):   patient will verbalize understanding of plan for hypertension management patient will attend all scheduled medical appointments: 10-29-2021 patient will demonstrate improved adherence to prescribed treatment plan for hypertension  as evidenced by taking all medications as prescribed, monitoring and recording blood  pressure as directed, adhering to low sodium/DASH diet  patient will demonstrate improved health management independence as evidenced by checking blood pressure as directed and notifying PCP if SBP>160 or DBP > 90, taking all medications as prescribe, and adhering to a low sodium diet as discussed.  patient will verbalize basic understanding of hypertension disease process and self health management plan as evidenced by compliance with medications, compliance with diet, and working with CCM team to manage health and well being  Interventions:  Collaboration with [name], NP regarding development and update of comprehensive plan of care as evidenced by provider attestation and co-signature Inter-disciplinary care team collaboration (see longitudinal plan of care) Evaluation of current treatment plan related to hypertension self management and patient's adherence to plan as established by provider. 07-23-2021: The patient is stable and has no new concerns with her blood pressure. Adheres to heart healthy diet and denies any issues with medications compliance. Will continue to monitor. 09-24-2021: The patient has been having elevations in blood pressures recently. States the reason for the 167/89 was she was having injections and she was nervous. Education and support given.  Provided education to patient re: stroke prevention, s/s of heart attack and stroke, DASH diet, complications of uncontrolled blood pressure Reviewed medications with patient and discussed importance of compliance. 09-24-2021: The patient verbalized she is compliant with medications.  Discussed plans with patient for ongoing care management follow up and provided patient with direct contact information for care management team Advised patient, providing education and rationale, to monitor blood pressure daily and record, calling PCP for findings  outside established parameters.  Reviewed scheduled/upcoming provider appointments including: 10-29-2021 Patient Goals: - blood pressure trends reviewed - depression screen reviewed - home or ambulatory blood pressure monitoring encouraged Self-Care Activities: - Self administers medications as prescribed Attends all scheduled provider appointments Calls provider office for new concerns, questions, or BP outside discussed parameters Checks BP and records as discussed Follows a low sodium diet/DASH diet Follow Up Plan: Telephone follow up appointment with care management team member scheduled for:  11-26-2021 at 145 pm    Task: RNCM: Identify and Monitor Blood Pressure Elevation Completed 07/23/2021  Outcome: Positive  Note:   Care Management Activities:    - blood pressure trends reviewed - depression screen reviewed - home or ambulatory blood pressure monitoring encouraged        Patient Care Plan: RNCM: Diabetes Type 2 (Adult)  Completed 10/29/2021   Problem Identified: RNCM: Glycemic Management (Diabetes, Type 2) Resolved 10/29/2021  Priority: High     Long-Range Goal: RNCM: Glycemic Management Optimized Completed 10/29/2021  Start Date: 02/15/2021  Expected End Date: 06/06/2022  Recent Progress: Not on track  Priority: High  Note:   Objective: Resolving, duplicate goal  Lab Results  Component Value Date   HGBA1C 8.2 (H) 01/29/2021   05-08-2021 A1C 8.1, 08-30-2021 9.1 Lab Results  Component Value Date   CREATININE 2.45 (H) 02/12/2021   CREATININE 2.60 (H) 01/29/2021   CREATININE 2.56 (H) 10/25/2020   No results found for: EGFR Current Barriers:  Knowledge Deficits related to medications used for management of diabetes Does not use cbg meter  Limited Social Support Unable to independently manage blood sugars Unable to self administer medications as prescribed Does not adhere to provider recommendations re: checking blood sugars as recommended by the provider   Does not adhere to prescribed medication regimen Lacks social connections Case Manager Clinical Goal(s):  patient will demonstrate improved adherence to prescribed treatment plan  for diabetes self care/management as evidenced by: daily monitoring and recording of CBG  adherence to ADA/ carb modified diet exercise 3/4 days/week adherence to prescribed medication regimen contacting provider for new or worsened symptoms or questions Interventions:  Collaboration with [name], NP regarding development and update of comprehensive plan of care as evidenced by provider attestation and co-signature Inter-disciplinary care team collaboration (see longitudinal plan of care) Provided education to patient about basic DM disease process Reviewed medications with patient and discussed importance of medication adherence. 09-24-2021: The patient is compliant with medications. Has had adjustments recently Discussed plans with patient for ongoing care management follow up and provided patient with direct contact information for care management team Provided patient with written educational materials related to hypo and hyperglycemia and importance of correct treatment.07-23-2021: The patient states that after taking her "shot" sometimes her blood sugars have been dropping "low". She states 63 and sometimes lower.  She knows what to do and usually eats something, drinks something or takes glucose tablets. She also keeps tablets by her bed.  She states this is happening on a consistent basis. Education provided. Will collaborate with pcp on information provided by the patient. The patient has an appointment on 07-29-2021.  The patient does desire to talk to her about her blood sugars, back pain, and she is having issues with being hoarse after talking. She states it is getting worse and she can no longer sing. Will send in basket message letting the pcp know of patients stated concerns. Education and support  provided. 09-24-2021: The patient states she still has to watch after taking her shot. She had an episode where she was at church and her blood sugar dropped. She did not check it but was diaphoretic and had to eat something to help it go back up. Discussed caring jelly beans to help when her blood sugars drop.  Reviewed scheduled/upcoming provider appointments including: 07-29-2021 at 11 am Advised patient, providing education and rationale, to check cbg bid and record, calling pcp for findings outside established parameters.  07-23-2021: The patient has been checking more frequently. The patients range is 63 to 140. This am her blood sugar was 113. 09-24-2021: the patient is using the freestyle libre now. Her average blood sugar is 163. Education on the goal of fasting <130 and post prandial of <180. Will continue to monitor.  Review of patient status, including review of consultants reports, relevant laboratory and other test results, and medications completed. Self-Care Activities - UNABLE to independently manage DM Self administers oral medications as prescribed Self administers insulin as prescribed Attends all scheduled provider appointments Checks blood sugars as prescribed and utilize hyper and hypoglycemia protocol as needed Adheres to prescribed ADA/carb modified Patient Goals: -- barriers to adherence to treatment plan identified - blood glucose monitoring encouraged - blood glucose readings reviewed - encourage participation in diabetes education classes - individualized medical nutrition therapy provided - resources required to improve adherence to care identified - self-awareness of signs/symptoms of hypo or hyperglycemia encouraged - use of blood glucose monitoring log promoted Follow Up Plan: Telephone follow up appointment with care management team member scheduled for: 11-26-2021 at 145 pm    Task: RNCM: Alleviate Barriers to Glycemic Management Completed 07/23/2021  Outcome:  Positive  Note:   Care Management Activities:    - barriers to adherence to treatment plan identified - blood glucose monitoring encouraged - blood glucose readings reviewed - encourage participation in diabetes education classes - individualized medical nutrition therapy  provided - resources required to improve adherence to care identified - self-awareness of signs/symptoms of hypo or hyperglycemia encouraged - use of blood glucose monitoring log promoted        Patient Care Plan: RNCM; General Plan of Care (Adult) for Chronic Disease Management and Care Coordination Needs     Problem Identified: RNCM: Development for Plan of Care for Chronic Disease Management (DM, HTN, HLD, HF, Chronic Pain)   Priority: High     Long-Range Goal: RNCM: Effective Management for Plan of Care for Chronic Disease Management (DM, HTN, HLD, HF, Chronic Pain) Completed 06/03/2022  Start Date: 10/29/2021  Expected End Date: 10/29/2022  Priority: High  Note:   Current Barriers: 06-03-2022: The patient has met the goals of care and the goals and care plan are being closed Knowledge Deficits related to plan of care for management of CHF, HTN, HLD, DMII, and Chronic Pain   Chronic Disease Management support and education needs related to CHF, HTN, HLD, DMII, and chronic pain   RNCM Clinical Goal(s):  Patient will verbalize understanding of plan for management of CHF, HTN, HLD, DMII, and chronic pain  as evidenced by seeing providers on a regular basis, taking medications as ordered, following plan of care and calling offie for changes take all medications exactly as prescribed and will call provider for medication related questions as evidenced by compliance and calling the pharmacy before running out of medications     attend all scheduled medical appointments: 07-29-2022, has specialist appointments coming up as evidenced by keeping appointments and calling the office for changes that need to be made if  unable to make appointment         demonstrate improved and ongoing health management independence as evidenced by working with the CCM team to optimize health and well being        demonstrate a decrease in CHF, HTN, HLD, DMII, and Chronic pain  exacerbations  as evidenced by following plan of care, working with the CCM team to effectively manage health and well being  demonstrate ongoing self health care management ability for effective management of chronic conditions  as evidenced by  working with the CCM team through collaboration with Consulting civil engineer, provider, and care team.   Interventions: 1:1 collaboration with primary care provider regarding development and update of comprehensive plan of care as evidenced by provider attestation and co-signature Inter-disciplinary care team collaboration (see longitudinal plan of care) Evaluation of current treatment plan related to  self management and patient's adherence to plan as established by provider   SDOH Barriers (Status: Goal on Track (progressing): YES.) Long Term Goal  Patient interviewed and SDOH assessment performed        Patient interviewed and appropriate assessments performed Provided patient with information about resources available and to call the office for changes in SDOH or new needs  Discussed plans with patient for ongoing care management follow up and provided patient with direct contact information for care management team Advised patient to call the office for changes in [place], questions, or concerns    Heart Failure Interventions:  (Status: Goal Met.)  Long Term Goal 06-03-2022: The patient has met the plan of care and goals have been met.  Wt Readings from Last 3 Encounters:  04/15/22 170 lb (77.1 kg)  04/01/22 170 lb (77.1 kg)  03/28/22 172 lb (78 kg)    Basic overview and discussion of pathophysiology of Heart Failure reviewed. 04-01-2022: Education about sx and sx to monitor for  possible CHF exacerbation. The  patient states that she is doing well other than she has noticed more swelling in her feet and legs. She says that she has tried the compression hose but they are too tight on her legs. She does put her sketchers on and this helps a lot with the swelling. Education provided. 06-03-2022: The patient has stable condition and no issues related to HF at this time. Knows to call for changes Provided education on low sodium diet. 06-03-2022: Review of heart healthy/ADA diet. The patient states she is watching what she eats and denies any acute issues with dietary restrictions. Discussed monitoring for hidden sodiums in food, especially when eating out.  Reviewed Heart Failure Action Plan in depth and provided written copy Assessed need for readable accurate scales in home. 04-01-2022: the patient states that she has a scale. She weighed this am and her weight was 170. Has had about a 10 pound weight gain since February. Education on monitoring weight on a consistent basis to catch subtle changes in water weight and possibly prevent an exacerbation of HF. 06-03-2022: The patient is stable and denies any weight changes at this time. Denies any acute changes in HF. Provided education about placing scale on hard, flat surface Advised patient to weigh each morning after emptying bladder. 06-03-2022: Review  Discussed importance of daily weight and advised patient to weigh and record daily. 06-03-2022: Review of the benefits of daily weights and calling the provider for +2 weight gain in one day, or +5 in one week. The patient verbalized understanding.  Reviewed role of diuretics in prevention of fluid overload and management of heart failure. 06-03-2022: Confirmed that the patient is taking her Torsemide.  Discussed the importance of keeping all appointments with provider. 11-26-2021: Saw pcp recently and will see specialist soon for further evaluation of raspy voice, has been working with speech therapy. Denies any issues  with HF. Will have a pulmonary appointment coming up for further evaluation of raspy voice as the speech therapist feels it has something to do with pulmonary. Saw pcp on 01-29-2022: 04-01-2022: Saw pulmonary provider on 03-28-2022 and there is no indication that her raspy voice is coming from pulmonary issues. She states they do not know what is causing it. She will follow up with the pulmonary provider in July and  next appointment with pcp on 07-29-2022 at 240 pm. 06-03-2022: The patient's appointment with pcp is 07-29-2022 at 240 pm Provided patient with education about the role of exercise in the management of heart failure Advised patient to discuss HF and heart health with provider Screening for signs and symptoms of depression related to chronic disease state  Assessed social determinant of health barriers Evaluation of edema and wearing compression stockings. The patient states she is not using these. Encouraged by the pcp to start wearing. RNCM recommended trying the [employer] compression socks for a different alternative to the compression hose. Wrote down the information for the patient and where to look into getting these socks. Encouraged the patient that this would also help with her neuropathy. 02-04-2022: The patient denies any swelling in her feet and legs. States her HF is stable. Will continue to monitor. She is still not using compression stocking. Encouraged use and ask the patient to elevate her legs when sitting down. She states her edema is stable and not more than usual. 04-01-2022: The patient tried the compression stockings but states these were too tight on her legs. She is putting her sketchers  on and this is helping her a lot. The patient states that she has not been on her feet a lot. Education on the sx and sx to look for for exacerbation of heart failure and to elevated feet and legs when sitting down. The patient verbalized understanding.   Diabetes:  (Status: Goal Met.) Long  Term Goal  06-03-2022; Goals have been met and plan of care closed  Lab Results  Component Value Date   HGBA1C 8.2 (H) 01/29/2021  08-30-2021: 9.1 at Endocrinologist. January 2023 at endocrinology 9.2% Assessed patient's understanding of A1c goal: <7%. 06-03-2022: Review of goal of A1C level Provided education to patient about basic DM disease process; Reviewed medications with patient and discussed importance of medication adherence. 06-03-2022: The patient states compliance with medications;        Reviewed prescribed diet with patient heart healthy/ADA. 06-03-2022: The patient is compliant with a heart healthy/ADA diet ; Counseled on importance of regular laboratory monitoring as prescribed. 06-03-2022: The patient has regular lab testing.   Discussed plans with patient for ongoing care management follow up and provided patient with direct contact information for care management team;      Provided patient with written educational materials related to hypo and hyperglycemia and importance of correct treatment. 10-29-2021: The patient had a low of 40 and 55 on 10-21-2021: Has had 7 lows over the last 90 days. Education on keeping gel with her in her pocket book and at home for lows. The patient does eat peanut butter and drinks something that will help bring it back up when she is at home. Education and support given.   11-26-2021: Denies any lows at this time. States her blood sugars are consistently around 112.   02-04-2022: The patient has not been checking her blood sugars this past week. She ran out of supplies. They shipped on 02-02-2022 so she should have them soon. Denies any sx and sx of hyper or hypoglycemia. 04-01-2022: The patient gave several readings today and the lowest was 64 and the highest was 342. Reviewed scheduled/upcoming provider appointments including: 07-29-2022 with pcp,  has upcoming specialist appointments ;         Advised patient, providing education and rationale, to check cbg as  directed, the patient has the freestyle Ramsay  and record . 10-29-2021: Average has been 186. The patients range has been 40 on 11-7 to 273.  Extensive education from the pcp today to discuss restarting long acting insulin.  The patient verbalized understanding. 02-04-2022: Encouraged the patient to check blood sugars per recommendations of the provider when she got her new supplies. The patient verbalized understanding. States that the last time she took it it was 110 and 112.   04-01-2022: The patient gave several readings to the [facility] and is checking her blood sugars more consistently. The patient states her blood sugars fluctuate sometimes. Range has been 64 to 342 with an average of 194 out of 12 readings. This am fasting blood sugar was 98. Review of fasting <130 and post prandial of <180.  06-03-2022: States that her blood sugars this am were 80 and it has been around 85 fasting. call provider for findings outside established parameters. 11-26-2021: States that her blood sugars have been consistently 112 in the ams. Denies any elevations or lows. 06-03-2022: Reminder to call the provider for out of range blood sugar readings. ;       Review of patient status, including review of consultants reports, relevant laboratory and other test  results, and medications completed;       Advised patient to discuss medications  with provider.  The patient has not been taking her long acting insulin but has her short acting and is experiencing some lows. Education and support given      Evaluation of neuropathy pain. The patient sees Dr. Holley Raring as she cannot tolerate gabapentin or lyricia. Cymbalta did not offer any relief either. Pcp is going to discuss capzasin cream wraps to toes with Dr. Holley Raring as this is something he is trying in the office now to help with pain relief from neuropathy. 04-01-2022: Denies any more pain than usual. States she is not having any pain at the time of the call.  Eye exam done in January of  2023  Hyperlipidemia:  (Status: Goal Met.) Long Term Goal  Lab Results  Component Value Date   CHOL 176 01/29/2022   HDL 85 01/29/2022   LDLCALC 76 01/29/2022   LDLDIRECT 67 12/30/2018   TRIG 84 01/29/2022   CHOLHDL 2.5 12/02/2019   Medication review performed; medication list updated in electronic medical record. 06-03-2022: Is taking Crestor 5 mg daily as directed.  Provider established cholesterol goals reviewed. 06-03-2022: The patient has WNL lab work. The patient is doing well with managing her cholesterol at this time. Praised for being at goal Counseled on importance of regular laboratory monitoring as prescribed. 06-03-2022: Has labs on a consistent basis.  Provided HLD educational materials; Reviewed role and benefits of statin for ASCVD risk reduction; Discussed strategies to manage statin-induced myalgias; Reviewed importance of limiting foods high in cholesterol. 06-03-2022: Review of heart healthy/ADA diet   Hypertension: (Status: Goal Met.) Last practice recorded BP readings:  BP Readings from Last 3 Encounters:  05/15/22 112/70  05/08/22 (!) 138/59  05/01/22 112/62  Most recent eGFR/CrCl: No results found for: "EGFR"  No components found for: CRCL EGFR in the nephrologist office was 21 Evaluation of current treatment plan related to hypertension self management and patient's adherence to plan as established by provider. 02-04-2022: The patient feels her blood pressure is better controlled. She denies any acute findings related to HTN health.  06-03-2022: The patient is having more stable blood pressures. Denies any issues with HTN  Provided education to patient re: stroke prevention, s/s of heart attack and stroke; Reviewed prescribed diet heart healthy/ADA diet  Reviewed medications with patient and discussed importance of compliance.06-03-2022: The patient is compliant with medications. Denies any medication needs;  Counseled on adverse effects of illicit drug and excessive  alcohol use in patients with high blood pressure;  Discussed plans with patient for ongoing care management follow up and provided patient with direct contact information for care management team; Advised patient, providing education and rationale, to monitor blood pressure daily and record, calling PCP for findings outside established parameters;  Advised patient to discuss blood pressure trends  with provider; Provided education on prescribed diet heart healthy/ADA diet ;  Discussed complications of poorly controlled blood pressure such as heart disease, stroke, circulatory complications, vision complications, kidney impairment, sexual dysfunction;   Pain:  (Status: Goal Met.) Long Term Goal 06-03-2022: goal met and being closed Pain assessment performed. 11-26-2021: States "0" pain at the time of the call. When she is active and over does it it can be up to a 10. 02-04-2022: The patient rates her back pain and neuropathy pain at a 5 today. She will go to the orthopedic provider on 02-07-2022 for evaluation of left wrist pain due to a  cyst. She states this has been there for a while but over time it has gotten worse. 06-03-2022: The patient denies pain today . The patient states when she is sitting she has no pain.  Medications reviewed. 06-03-2022: Is compliant with medications regimen  Reviewed provider established plan for pain management. 06-03-2022: Is compliant with plan of care for pain management  Discussed importance of adherence to all scheduled medical appointments. Next appointment with pcp is 07-29-2022 at 240 pm; Counseled on the importance of reporting any/all new or changed pain symptoms or management strategies to pain management provider; Advised patient to report to care team affect of pain on daily activities; Discussed use of relaxation techniques and/or diversional activities to assist with pain reduction (distraction, imagery, relaxation, massage, acupressure, TENS, heat, and cold  application. 10-29-2021: The patient uses CBD oil and uses a spray to help with relaxing her and that has been effective in helping her rest. The pain is were she had shingles and also OA. The patient had an injection from the pain clinic but only was effective for a short period of time. The patient states that she is also have neuropathy pain in toes. The patient cannot tolerate gabapentin or Lyrica. The pcp to collaborate with pain provider. Discussed capzasin cream wraps for toes to see if that would help with neuropathy pain. The patient is open to recommendations  Reviewed with patient prescribed pharmacological and nonpharmacological pain relief strategies; Advised patient to discuss unresolved pain, changes in intensity or level of pain  with provider;   Patient Goals/Self-Care Activities: Patient will self administer medications as prescribed as evidenced by self report/primary caregiver report  Patient will attend all scheduled provider appointments as evidenced by clinician review of documented attendance to scheduled appointments and patient/caregiver report Patient will call pharmacy for medication refills as evidenced by patient report and review of pharmacy fill history as appropriate Patient will attend church or other social activities as evidenced by patient report Patient will continue to perform ADL's independently as evidenced by patient/caregiver report Patient will continue to perform IADL's independently as evidenced by patient/caregiver report Patient will call provider office for new concerns or questions as evidenced by review of documented incoming telephone call notes and patient report Patient will work with BSW to address care coordination needs and will continue to work with the clinical team to address health care and disease management related needs as evidenced by documented adherence to scheduled care management/care coordination appointments call office if I gain  more than 2 pounds in one day or 5 pounds in one week keep legs up while sitting track weight in diary use salt in moderation watch for swelling in feet, ankles and legs every day weigh myself daily develop a rescue plan follow rescue plan if symptoms flare-up eat more whole grains, fruits and vegetables, lean meats and healthy fats know when to call the doctorfor changes in HF, questions, or concerns track symptoms and what helps feel better or worse dress right for the weather, hot or cold schedule appointment with eye doctor- eye exam in January of 2023, denies any new changes or needs related to vision check blood sugar at prescribed times: when you have symptoms of low or high blood sugar, before and after exercise, and has freestyle libre and can check blood sugars frequently  check feet daily for cuts, sores or redness enter blood sugar readings and medication or insulin into daily log take the blood sugar meter to all doctor visits trim  toenails straight across drink 6 to 8 glasses of water each day eat fish at least once per week fill half of plate with vegetables limit fast food meals to no more than 1 per week manage portion size prepare main meal at home 3 to 5 days each week read food labels for fat, fiber, carbohydrates and portion size reduce red meat to 2 to 3 times a week keep feet up while sitting wash and dry feet carefully every day wear comfortable, cotton socks wear comfortable, well-fitting shoes - check blood pressure daily - choose a place to take my blood pressure (home, clinic or office, retail store) - write blood pressure results in a log or diary - learn about high blood pressure - keep a blood pressure log - take blood pressure log to all doctor appointments - call doctor for signs and symptoms of high blood pressure - develop an action plan for high blood pressure - keep all doctor appointments - take medications for blood pressure exactly as  prescribed - report new symptoms to your doctor - eat more whole grains, fruits and vegetables, lean meats and healthy fats - call for medicine refill 2 or 3 days before it runs out - take all medications exactly as prescribed - call doctor with any symptoms you believe are related to your medicine - call doctor when you experience any new symptoms - go to all doctor appointments as scheduled - adhere to prescribed diet: heart healthy/ADA diet       Patient Care Plan: CCM Pharmacy Care plan     Problem Identified: Disease State Management      Long-Range Goal: DM, HTN, Cholesterol   Start Date: 10/13/2022  Expected End Date: 10/13/2023  This Visit's Progress: On track  Priority: High  Note:   Current Barriers:  Does not contact provider office for questions/concerns  Pharmacist Clinical Goal(s):  Patient will achieve adherence to monitoring guidelines and medication adherence to achieve therapeutic efficacy through collaboration with PharmD and provider.   Interventions: 1:1 collaboration with [name], NP regarding development and update of comprehensive plan of care as evidenced by provider attestation and co-signature Inter-disciplinary care team collaboration (see longitudinal plan of care) Comprehensive medication review performed; medication list updated in electronic medical record  Heart Failure (Goal: manage symptoms and prevent exacerbations) -Controlled -Last ejection fraction:  Echocardiogram December 2020  ejection fraction 60 to 65% -HF type: Diastolic -NYHA Class: III (marked limitation of activity) -AHA HF Stage: C (Heart disease and symptoms present) -Current treatment: Carvedilol 3.143m BID Appropriate, Effective, Safe, Accessible Olmesartan 456mAppropriate, Effective, Safe, Accessible Torsemide 4053mppropriate, Effective, Safe, Accessible Pioglitazone Query Appropriate,  -Medications previously tried: N/A  -Current home BP/HR readings:  Doesn't test -Current dietary habits: "Tries to eat healthy" -Current exercise habits: None -Educated on Benefits of medications for managing symptoms and prolonging life October 2023: Will ask Endo to DC pioglitazone   Hyperlipidemia: (LDL goal < 70) The 10-year ASCVD risk score (Arnett DK, et al., 2019) is: 25.6%   Values used to calculate the score:     Age: [age]     Sex: Female     Is Non-Hispanic African American: Yes     Diabetic: Yes     Tobacco smoker: No     Systolic Blood Pressure: 110496Hg     Is BP treated: Yes     HDL Cholesterol: 76 mg/dL     Total Cholesterol: 177 mg/dL Lab Results  Component Value Date  CHOL 177 08/05/2022   CHOL 176 01/29/2022   CHOL 172 10/29/2021   Lab Results  Component Value Date   HDL 76 08/05/2022   HDL 85 01/29/2022   HDL 70 10/29/2021   Lab Results  Component Value Date   LDLCALC 83 08/05/2022   LDLCALC 76 01/29/2022   LDLCALC 87 10/29/2021   Lab Results  Component Value Date   TRIG 101 08/05/2022   TRIG 84 01/29/2022   TRIG 83 10/29/2021   Lab Results  Component Value Date   CHOLHDL 2.5 12/02/2019   CHOLHDL 2.0 12/30/2018   Lab Results  Component Value Date   LDLDIRECT 67 12/30/2018  Last vitamin D Lab Results  Component Value Date   VD25OH 24.4 (L) 08/05/2022   Lab Results  Component Value Date   TSH 2.040 08/05/2022  -Not ideally controlled -Current treatment: Rosuvastatin 24m QD Appropriate, Effective, Safe, Accessible -Medications previously tried: Atorvastatin  -Current dietary patterns: "Tries to eat healthy" -Current exercise habits: None -Educated on Cholesterol goals;  October 2023: Would like to get patient off renal statin and on liver one. However, documented Hx of myalgias and Rosuvastatin dose is WNL -Recommended to continue current medication  Diabetes (A1c goal <8%) Lab Results  Component Value Date   HGBA1C 8.1 (H) 08/26/2022   HGBA1C 8.2 (H) 01/29/2021   HGBA1C 7.9 (H)  10/25/2020   Lab Results  Component Value Date   MICROALBUR 150 (H) 01/29/2021   LDLCALC 83 08/05/2022   CREATININE 2.65 (H) 08/27/2022   Lab Results  Component Value Date   NA 144 08/27/2022   K 4.5 08/27/2022   CREATININE 2.65 (H) 08/27/2022   GFRNONAA 18 (L) 08/27/2022   GLUCOSE 77 08/27/2022   Lab Results  Component Value Date   WBC 6.6 08/27/2022   HGB 9.0 (L) 08/27/2022   HCT 29.4 (L) 08/27/2022   MCV 88.6 08/27/2022   PLT 237 08/27/2022   Lab Results  Component Value Date   MICROALBUR 150 (H) 01/29/2021   MICROALBUR 150 (H) 04/10/2020  -Uncontrolled -Current medications: Humalog 6-10 units TID Appropriate, Query effective,  Patient states she's very compliant Pioglitazone Query Appropriate,  -Medications previously tried: N/A  -Current home glucose readings fasting glucose:  October 2023: Unable to view CGM on my computer post prandial glucose:  -Denies hypoglycemic/hyperglycemic symptoms -Current exercise: None -Educated on A1c and blood sugar goals; -Counseled to check feet daily and get yearly eye exams October 2023: Will ask Endo to stop Pioglitazone. Their note mentioned GLP1 as an option but unable to due to cost. Will ask if still an option and if so, CCM team will do PAP  Patient Goals/Self-Care Activities Patient will:  - take medications as prescribed as evidenced by patient report and record review  Follow Up Plan: The patient has been provided with contact information for the care management team and has been advised to call with any health related questions or concerns.   April 2024 F/U CPP  NArizona Constable Pharm.D. -- [phone]      Ms. SGaillardwas given information about Chronic Care Management services today including:  CCM service includes personalized support from designated clinical staff supervised by her physician, including individualized plan of care and coordination with other care providers 24/7 contact phone numbers for  assistance for urgent and routine care needs. Standard insurance, coinsurance, copays and deductibles apply for chronic care management only during months in which we provide at least 20 minutes of these services. Most insurances cover  these services at 100%, however patients may be responsible for any copay, coinsurance and/or deductible if applicable. This service may help you avoid the need for more expensive face-to-face services. Only one practitioner may furnish and bill the service in a calendar month. The patient may stop CCM services at any time (effective at the end of the month) by phone call to the office staff.  Patient agreed to services and verbal consent obtained.   The patient verbalized understanding of instructions, educational materials, and care plan provided today and DECLINED offer to receive copy of patient instructions, educational materials, and care plan.  The pharmacy team will reach out to the patient again over the next 30 days.   Lane Hacker, [place]

## 2022-10-14 DIAGNOSIS — E785 Hyperlipidemia, unspecified: Secondary | ICD-10-CM | POA: Diagnosis not present

## 2022-10-14 DIAGNOSIS — Z794 Long term (current) use of insulin: Secondary | ICD-10-CM

## 2022-10-14 DIAGNOSIS — E1159 Type 2 diabetes mellitus with other circulatory complications: Secondary | ICD-10-CM | POA: Diagnosis not present

## 2022-10-14 DIAGNOSIS — I509 Heart failure, unspecified: Secondary | ICD-10-CM | POA: Diagnosis not present

## 2022-10-15 ENCOUNTER — Telehealth: Payer: Self-pay

## 2022-10-15 NOTE — Progress Notes (Signed)
Chronic Care Management Pharmacy Assistant   Name: Tonya [name]  MRN: [id number] DOB: 1946/02/14   Reason for Encounter: Disease State   Conditions to be addressed/monitored: DMII   Recent office visits:  None since last coordination call on 10/13/22  Recent consult visits:  None since last coordination call on 10/13/22  Hospital visits:  Medication Reconciliation was completed by comparing discharge summary, patient's EMR and Pharmacy list, and upon discussion with patient.  Admitted to the hospital on 08/26/22 due to hiatal hernia. Discharge date was 08/27/22. Discharged from [facility].    Medications that remain the same after Hospital Discharge:??  -All other medications will remain the same.    Medications: Outpatient Encounter Medications as of 10/15/2022  Medication Sig   acetaminophen (TYLENOL) 650 MG CR tablet Take 650 mg by mouth every 8 (eight) hours as needed for pain.   aspirin 81 MG tablet Take 81 mg by mouth daily.   carvedilol (COREG) 3.125 MG tablet TAKE 1 TABLET BY MOUTH TWICE  DAILY WITH MEALS   clopidogrel (PLAVIX) 75 MG tablet TAKE 1 TABLET BY MOUTH  DAILY   docusate sodium (COLACE) 100 MG capsule Take 100 mg by mouth daily as needed for mild constipation.   HUMALOG KWIKPEN 100 UNIT/ML KiwkPen 6-10 Units See admin instructions. Take 10 units before morning meal. Take 8 units before the afternoon and evening snacks.   ibuprofen (ADVIL) 600 MG tablet Take 1 tablet (600 mg total) by mouth every 6 (six) hours as needed. (Patient not taking: Reported on 10/13/2022)   methocarbamol (ROBAXIN) 500 MG tablet Take 1 tablet by mouth every morning.   naloxone (NARCAN) nasal spray 4 mg/0.1 mL SMARTSIG:Both Nares   NOVOFINE 32G X 6 MM MISC USE AS DIRECTED THREE TIMES DAILY WITH HUMALOG   olmesartan (BENICAR) 40 MG tablet Take 1 tablet (40 mg total) by mouth daily. (Patient taking differently: Take 40 mg by mouth every morning.)   omeprazole  (PRILOSEC) 40 MG capsule Take 1 capsule (40 mg total) by mouth in the morning and at bedtime. (Patient not taking: Reported on 10/13/2022)   oxyCODONE (OXY IR/ROXICODONE) 5 MG immediate release tablet Take 1 tablet (5 mg total) by mouth every 6 (six) hours as needed for severe pain or breakthrough pain. (Patient not taking: Reported on 10/13/2022)   pioglitazone (ACTOS) 30 MG tablet Take 30 mg by mouth daily.   rosuvastatin (CRESTOR) 5 MG tablet Take 1 tablet (5 mg total) by mouth daily. (Patient taking differently: Take 5 mg by mouth every morning.)   torsemide (DEMADEX) 20 MG tablet Take 2 tablets (40 mg total) by mouth daily. (Patient taking differently: Take 40 mg by mouth every morning.)   vitamin B-12 (CYANOCOBALAMIN) 100 MCG tablet Take 100 mcg by mouth daily.   Vitamin D, Cholecalciferol, 50 MCG (2000 UT) CAPS Take 2,000 Units by mouth daily.   No facility-administered encounter medications on file as of 10/15/2022.   Recent Relevant Labs: Lab Results  Component Value Date/Time   HGBA1C 8.1 (H) 08/26/2022 04:13 PM   HGBA1C 8.2 (H) 01/29/2021 11:05 AM   HGBA1C 7.9 (H) 10/25/2020 11:24 AM   HGBA1C 7.6 07/09/2020 12:00 AM   HGBA1C 8.5 03/28/2020 12:00 AM   MICROALBUR 150 (H) 01/29/2021 11:05 AM   MICROALBUR 150 (H) 04/10/2020 11:34 AM    Kidney Function Lab Results  Component Value Date/Time   CREATININE 2.65 (H) 08/27/2022 04:42 AM   CREATININE 2.53 (H) 08/26/2022 04:13 PM  GFRNONAA 18 (L) 08/27/2022 04:42 AM   GFRAA 20 (L) 01/29/2021 11:10 AM   Note: called patient cell phone mailbox was full, so call her home number and patient stated to stop calling her and hung up.   Current antihyperglycemic regimen:  Humalog Kwikpen 100 units Pioglitazone 30 mg   Adherence Review: Is the patient currently on a STATIN medication? Yes Is the patient currently on ACE/ARB medication? Yes Does the patient have >5 day gap between last estimated fill dates? No   Care  Gaps: Colonoscopy-01/16/20 Diabetic Foot Exam-08/05/22 Mammogram-01/07/21 Ophthalmology-06/06/22 Dexa Scan - 03/11/17 Annual Well Visit - NA Micro albumin-NA Hemoglobin A1c- 08/26/22 (8.1), 07/09/20 (7.6)  Star Rating Drugs: Olmesartan 40 mg-LF 08/08/22 90 ds Rosuvastatin 5 mg-LF 08/08/22 90 ds  [place] [phone]

## 2022-10-15 NOTE — Telephone Encounter (Signed)
Tasha from Endocrine called stating ok to DC Pioglitazone. They will also coordinate to increase insulin.  Verbal from Tasha to start Trulicity. Will ask [name] for verbal so we can start the paperwork. Sent direct msg.   Verbal per Joelene at 1600 via direct msg for PAP for Trulicity

## 2022-10-20 ENCOUNTER — Inpatient Hospital Stay: Payer: Medicare Other

## 2022-10-20 ENCOUNTER — Inpatient Hospital Stay: Payer: Medicare Other | Attending: Oncology

## 2022-10-20 VITALS — BP 152/90 | HR 91 | Resp 20

## 2022-10-20 DIAGNOSIS — D631 Anemia in chronic kidney disease: Secondary | ICD-10-CM | POA: Diagnosis not present

## 2022-10-20 DIAGNOSIS — N184 Chronic kidney disease, stage 4 (severe): Secondary | ICD-10-CM | POA: Diagnosis not present

## 2022-10-20 LAB — HEMOGLOBIN AND HEMATOCRIT, BLOOD
HCT: 31.5 % — ABNORMAL LOW (ref 36.0–46.0)
Hemoglobin: 9.7 g/dL — ABNORMAL LOW (ref 12.0–15.0)

## 2022-10-20 MED ORDER — EPOETIN ALFA-EPBX 10000 UNIT/ML IJ SOLN
10000.0000 [IU] | Freq: Once | INTRAMUSCULAR | Status: AC
  Administered 2022-10-20: 10000 [IU] via SUBCUTANEOUS
  Filled 2022-10-20: qty 1

## 2022-10-22 ENCOUNTER — Other Ambulatory Visit: Payer: Self-pay | Admitting: Nurse Practitioner

## 2022-10-22 DIAGNOSIS — E1169 Type 2 diabetes mellitus with other specified complication: Secondary | ICD-10-CM

## 2022-10-22 DIAGNOSIS — E1140 Type 2 diabetes mellitus with diabetic neuropathy, unspecified: Secondary | ICD-10-CM

## 2022-10-22 DIAGNOSIS — I130 Hypertensive heart and chronic kidney disease with heart failure and stage 1 through stage 4 chronic kidney disease, or unspecified chronic kidney disease: Secondary | ICD-10-CM

## 2022-10-23 ENCOUNTER — Encounter: Payer: Self-pay | Admitting: Student in an Organized Health Care Education/Training Program

## 2022-10-23 ENCOUNTER — Other Ambulatory Visit: Payer: Self-pay

## 2022-10-23 ENCOUNTER — Telehealth: Payer: Self-pay

## 2022-10-23 ENCOUNTER — Ambulatory Visit
Payer: Medicare Other | Attending: Student in an Organized Health Care Education/Training Program | Admitting: Student in an Organized Health Care Education/Training Program

## 2022-10-23 VITALS — BP 161/87 | HR 97 | Temp 97.4°F | Resp 18 | Ht 67.0 in | Wt 140.0 lb

## 2022-10-23 DIAGNOSIS — G894 Chronic pain syndrome: Secondary | ICD-10-CM

## 2022-10-23 DIAGNOSIS — M47816 Spondylosis without myelopathy or radiculopathy, lumbar region: Secondary | ICD-10-CM | POA: Diagnosis not present

## 2022-10-23 MED ORDER — METHOCARBAMOL 500 MG PO TABS: 500.0000 mg | ORAL_TABLET | Freq: Every day | ORAL | 2 refills | Status: DC | PRN

## 2022-10-23 NOTE — Progress Notes (Signed)
PROVIDER NOTE: Information contained herein reflects review and annotations entered in association with encounter. Interpretation of such information and data should be left to medically-trained personnel. Information provided to patient can be located elsewhere in the medical record under "Patient Instructions". Document created using STT-dictation technology, any transcriptional errors that may result from process are unintentional.    Patient: Tonya [name]  Service Category: E/M  Provider: Gillis Santa, MD  DOB: 02-07-1946  DOS: 10/23/2022  Specialty: Interventional Pain Management  MRN: [id number]  Setting: Ambulatory outpatient  PCP: [name], NP  Type: Established Patient    Referring Provider: [name], NP  Location: Office  Delivery: Face-to-face     HPI  Reason for encounter: [name], a 76 y.o. year old female, is here today for evaluation and management of her Lumbar facet arthropathy [M47.816]. [name] primary complain today is Back Pain (Low and right)  Last encounter: Practice (03/07/2020). My last encounter with her was on 09/02/21 Pertinent problems: [name] has Chronic pain syndrome; Lumbar degenerative disc disease; and Lumbar spondylosis on their pertinent problem list. Pain Assessment: Severity of Chronic pain is reported as a 8 /10. Location: Back Lower/right hip and lateral right leg. Onset: More than a month ago. Quality: Spasm, Aching. Timing: Intermittent. Modifying factor(s): heat, tylenol, topicals. Vitals:  height is _0  (1.702 m) and weight is 140 lb (63.5 kg). Her temporal temperature is 97.4 F (36.3 C) (abnormal). Her blood pressure is 161/87 (abnormal) and her pulse is 97. Her respiration is 18 and oxygen saturation is 99%.   Patient presents with increased right> left lower back pain that is worse with facet loading related to lumbar facet arthropathy, lumbar spondylosis.  She is status post right L3, L4, L5 lumbar facet medial  branch nerve block 06/27/2020 and 09/02/2021, over 1 year ago.  This provided her with significant pain relief, rated as proximately 85% for more than 8 months with a gradual return of her pain thereafter.  She also noted functional improvement during this time.  Given that she has had 2 positive diagnostic lumbar facet medial branch nerve blocks at right L3, L4, L5 with greater than 65% pain relief for over 3 months, recommend right L3, L4, L5 RFA for the purpose of obtaining longer-term pain relief.  Risk and benefits reviewed and patient like to proceed.  Instructed to stop her Plavix 7 days prior.   ROS  Constitutional: Denies any fever or chills Gastrointestinal: No reported hemesis, hematochezia, vomiting, or acute GI distress Musculoskeletal:  Low back pain, right greater than left Neurological: No reported episodes of acute onset apraxia, aphasia, dysarthria, agnosia, amnesia, paralysis, loss of coordination, or loss of consciousness  Medication Review  Insulin Pen Needle, acetaminophen, aspirin, carvedilol, clopidogrel, docusate sodium, ibuprofen, methocarbamol, naloxone, olmesartan, omeprazole, rosuvastatin, torsemide, vitamin B-12, and vitamin D3  History Review  Allergy: [name] is allergic to atorvastatin, gabapentin, pravastatin, pregabalin, and trulicity [dulaglutide]. Drug: [name]  reports no history of drug use. Alcohol:  reports no history of alcohol use. Tobacco:  reports that she has never smoked. She has never used smokeless tobacco. Social: [name]  reports that she has never smoked. She has never used smokeless tobacco. She reports that she does not drink alcohol and does not use drugs. 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 Surgical: Ms. Bills  has a past surgical history that includes Tubal ligation; Colonoscopy (12/15/2006); Colonoscopy with propofol (N/A, 06/06/2019); Colonoscopy with propofol (N/A, 01/16/2020); polypectomy (N/A, 01/16/2020); Cataract extraction w/PHACO (Left, 02/05/2021); Cataract extraction w/PHACO (Right, 03/19/2021); Eye surgery; Esophagogastroduodenoscopy (egd) with propofol (N/A, 06/23/2022); Xi robotic assisted paraesophageal hernia repair (N/A, 08/26/2022); and Insertion of mesh (08/26/2022). Family: family history includes Diabetes in her brother, brother, mother, and sister; Gout in her son; Heart attack in her sister; Hyperlipidemia in her father; Hypertension in her father; Kidney disease in her son; Lymphoma in her sister; Stroke in her mother.  Laboratory Chemistry Profile   Renal Lab Results  Component Value Date   BUN 61 (H) 08/27/2022   CREATININE 2.65 (H) 08/27/2022   BCR 22 01/29/2021   GFRAA 20 (L) 01/29/2021   GFRNONAA 18 (L) 08/27/2022     Hepatic Lab Results  Component Value Date   AST 21 04/15/2022   ALT 12 04/15/2022   ALBUMIN 3.2 (L) 04/15/2022   ALKPHOS 57 04/15/2022     Electrolytes Lab Results  Component Value Date   NA 144 08/27/2022   K 4.5 08/27/2022   CL 118 (H) 08/27/2022   CALCIUM 8.2 (L) 08/27/2022   MG 2.1 08/05/2022     Bone Lab Results  Component Value Date   VD25OH 24.4 (L) 08/05/2022     Inflammation (CRP: Acute Phase) (ESR: Chronic Phase) Lab Results  Component Value Date   CRP 1.1 04/14/2018   ESRSEDRATE 14  04/14/2018       Note: Above Lab results reviewed.  Recent Imaging Review  DG ESOPHAGUS W DOUBLE CM (HD) CLINICAL DATA:  Patient with history of dysphonia. Found to have a hiatal hernia on CT. Patient presents for double contrast esophagram for further evaluation for possible hernia repair.  EXAM: ESOPHAGUS/BARIUM SWALLOW/TABLET STUDY  TECHNIQUE: Combined double and single contrast examination was performed using effervescent crystals, high-density barium, and thin liquid barium. This exam was performed by Karen Chafe P., and was supervised and interpreted by Dr. Salvatore Marvel.  FLUOROSCOPY: Radiation Exposure Index (as provided by the fluoroscopic device): 30.7 mGy Kerma  COMPARISON:  CT abdomen and pelvis from July 12, 2022.  FINDINGS: Swallowing: Appears normal. No vestibular penetration or aspiration seen.  Pharynx: Unremarkable.  Esophagus: Mildly patulous lower thoracic esophagus. No clinically significant esophageal stricture. No discrete esophageal mass or ulcer.  Esophageal motility: Within normal limits.  Hiatal Hernia: Moderate-sized hiatal hernia.  Gastroesophageal reflux: Mild small volume gastroesophageal reflux observed to the level of the lower esophagus during the water siphon maneuver  Ingested 68m barium tablet: Passed normally  Other: None.  IMPRESSION: 1. Moderate hiatal hernia.  Mild gastroesophageal reflux. 2. Mildly patulous lower thoracic esophagus. Normal esophageal motility. No evidence of esophageal mass or clinically significant CT face stricture.  This exam was performed by JKaren ChafeP. and was supervised by Dr. JSalvatore Marvel Electronically Signed   By: JIlona SorrelM.D.   On: 07/18/2022 16:29  Note: Reviewed        Physical Exam  General appearance: Well nourished, well developed, and well hydrated. In no apparent acute distress Mental status: Alert,  oriented x 3 (person, place, & time)        Respiratory: No evidence of acute respiratory distress Eyes: PERLA Vitals: BP (!) 161/87   Pulse 97   Temp (!) 97.4 F (36.3 C) (Temporal)   Resp 18   Ht _0  (1.702 m)   Wt 140 lb (63.5 kg)   LMP  (LMP Unknown)   SpO2 99%   BMI 21.93 kg/m  BMI: Estimated body mass index is 21.93 kg/m as calculated from the following:   Height as of this encounter: _1  (1.702 m).   Weight as of this encounter: 140 lb (63.5 kg). Ideal: Ideal body weight: 61.6 kg (135 lb 12.9 oz) Adjusted ideal body weight: 62.4 kg (137 lb 7.7 oz)  Lumbar Spine Area Exam  Skin & Axial Inspection: No masses, redness, or swelling Alignment: Symmetrical Functional ROM: Pain restricted ROM affecting primarily the right Stability: No instability detected Muscle Tone/Strength: Functionally intact. No obvious neuro-muscular anomalies detected. Sensory (Neurological): Musculoskeletal pain pattern Palpation: Complains of area being tender to palpation       Provocative Tests: Hyperextension/rotation test: (+) on the right for facet joint pain. Lumbar quadrant test (Kemp's test): (+) on the right for facet joint pain.  Gait & Posture Assessment  Ambulation: Limited Gait: Significantly limited. Dependent on assistive device to ambulate Posture: Difficulty standing up straight, due to pain  Lower Extremity Exam    Side: Right lower extremity  Side: Left lower extremity  Stability: No instability observed          Stability: No instability observed          Skin & Extremity Inspection: Skin color, temperature, and hair growth are WNL. No peripheral edema or cyanosis. No masses, redness, swelling, asymmetry, or associated skin lesions. No contractures.  Skin & Extremity Inspection: Skin color, temperature, and hair growth are WNL. No peripheral edema or cyanosis. No masses, redness, swelling, asymmetry, or associated skin lesions. No contractures.  Functional ROM: Pain restricted ROM                  Functional ROM:  Unrestricted ROM                  Muscle Tone/Strength: Functionally intact. No obvious neuro-muscular anomalies detected.  Muscle Tone/Strength: Functionally intact. No obvious neuro-muscular anomalies detected.  Sensory (Neurological): Unimpaired        Sensory (Neurological): Unimpaired        DTR: Patellar: deferred today Achilles: deferred today Plantar: deferred today  DTR: Patellar: deferred today Achilles: deferred today Plantar: deferred today  Palpation: No palpable anomalies  Palpation: No palpable anomalies    Assessment   Status Diagnosis  Having a Flare-up Having a Flare-up Persistent 1. Lumbar facet arthropathy   2. Lumbar spondylosis   3. Chronic pain syndrome       Updated Problems: No problems updated.   Plan of Care   Ms. LORALEE WEITZMAN has a current medication list which includes the following long-term medication(s): carvedilol, olmesartan, omeprazole, rosuvastatin, and torsemide.  Orders:  Orders Placed This Encounter  Procedures   Radiofrequency,Lumbar    Standing Status:   Future    Standing Expiration Date:   01/23/2023    Scheduling Instructions:     Side(s): RIGHT     Level(s): L3, L4, L5,  Medial Branch Nerve(s)     Sedation: IV Versed     Stop Plavix 7 days prior     Scheduling Timeframe: As soon as  pre-approved    Order Specific Question:   Where will this procedure be performed?    Answer:   [facility]   Requested Prescriptions   Signed Prescriptions Disp Refills   methocarbamol (ROBAXIN) 500 MG tablet 30 tablet 2    Sig: Take 1 tablet (500 mg total) by mouth daily as needed for muscle spasms.     Follow-up plan:   Return in about 4 weeks (around 11/20/2022) for Right L3, 4, 5 RFA, in clinic IV Versed (stop Plavix 7 days prior).     Postherpetic neuralgia: Failed gabapentin, Lyrica, Cymbalta, tramadol.    Recent Visits No visits were found meeting these conditions. Showing recent visits within past 90 days and meeting  all other requirements Today's Visits Date Type Provider Dept  10/23/22 Office Visit Gillis Santa, MD [facility]  Showing today's visits and meeting all other requirements Future Appointments No visits were found meeting these conditions. Showing future appointments within next 90 days and meeting all other requirements  I discussed the assessment and treatment plan with the patient. The patient was provided an opportunity to ask questions and all were answered. The patient agreed with the plan and demonstrated an understanding of the instructions.  Patient advised to call back or seek an in-person evaluation if the symptoms or condition worsens.  Duration of encounter: 30 minutes.  Note by: Gillis Santa, MD Date: 10/23/2022; Time: 2:19 PM

## 2022-10-23 NOTE — Telephone Encounter (Signed)
   CCM RN Visit Note   10-23-2022 Name: Tonya [name] MRN: [id number]      DOB: Sep 01, 1946  Subjective: Tonya [name] is a 76 y.o. year old female who is a primary care patient of [name], NP. The patient was referred to the Chronic Care Management team for assistance with care management needs subsequent to provider initiation of CCM services and plan of care.      An unsuccessful telephone outreach was attempted today to contact the patient about Chronic Care Management needs.    Plan:A HIPAA compliant phone message was left for the patient providing contact information and requesting a return call.  [name] RN, MSN, CCM RN Care Manager  Chronic Care Management Direct Number: [phone]

## 2022-10-23 NOTE — Patient Instructions (Addendum)
Lumbar radiofrequency ablation ______________________________________________________________________  Preparing for your procedure  During your procedure appointment there will be: No Prescription Refills. No disability issues to discussed. No medication changes or discussions.  Instructions: Food intake: Avoid eating anything solid for at least 8 hours prior to your procedure. Clear liquid intake: You may take clear liquids such as water up to 2 hours prior to your procedure. (No carbonated drinks. No soda.) Transportation: Unless otherwise stated by your physician, bring a driver. Morning Medicines: Except for blood thinners, take all of your other morning medications with a sip of water. Make sure to take your heart and blood pressure medicines. If your blood pressure's lower number is above 100, the case will be rescheduled. Blood thinners: If you take a blood thinner, but were not instructed to stop it, call our office [phone] and ask to talk to a nurse. Not stopping a blood thinner prior to certain procedures could lead to serious complications. Diabetics on insulin: Notify the staff so that you can be scheduled 1st case in the morning. If your diabetes requires high dose insulin, take only  of your normal insulin dose the morning of the procedure and notify the staff that you have done so. Preventing infections: Shower with an antibacterial soap the morning of your procedure.  Build-up your immune system: Take 1000 mg of Vitamin C with every meal (3 times a day) the day prior to your procedure. Antibiotics: Inform the nursing staff if you are taking any antibiotics or if you have any conditions that may require antibiotics prior to procedures. (Example: recent joint implants)   Pregnancy: If you are pregnant make sure to notify the nursing staff. Not doing so may result in injury to the fetus, including death.  Sickness: If you have a cold, fever, or any active infections, call  and cancel or reschedule your procedure. Receiving steroids while having an infection may result in complications. Arrival: You must be in the facility at least 30 minutes prior to your scheduled procedure. Tardiness: Your scheduled time is also the cutoff time. If you do not arrive at least 15 minutes prior to your procedure, you will be rescheduled.  Children: Do not bring any children with you. Make arrangements to keep them home. Dress appropriately: There is always a possibility that your clothing may get soiled. Avoid long dresses. Valuables: Do not bring any jewelry or valuables.  Reasons to call and reschedule or cancel your procedure: (Following these recommendations will minimize the risk of a serious complication.) Surgeries: Avoid having procedures within 2 weeks of any surgery. (Avoid for 2 weeks before or after any surgery). Flu Shots: Avoid having procedures within 2 weeks of a flu shots or . (Avoid for 2 weeks before or after immunizations). Barium: Avoid having a procedure within 7-10 days after having had a radiological study involving the use of radiological contrast. (Myelograms, Barium swallow or enema study). Heart attacks: Avoid any elective procedures or surgeries for the initial 6 months after a "Myocardial Infarction" (Heart Attack). Blood thinners: It is imperative that you stop these medications before procedures. Let us know if you if you take any blood thinner.  Infection: Avoid procedures during or within two weeks of an infection (including chest colds or gastrointestinal problems). Symptoms associated with infections include: Localized redness, fever, chills, night sweats or profuse sweating, burning sensation when voiding, cough, congestion, stuffiness, runny nose, sore throat, diarrhea, nausea, vomiting, cold or Flu symptoms, recent or current infections. It is specially important if  the infection is over the area that we intend to treat. Heart and lung problems:  Symptoms that may suggest an active cardiopulmonary problem include: cough, chest pain, breathing difficulties or shortness of breath, dizziness, ankle swelling, uncontrolled high or unusually low blood pressure, and/or palpitations. If you are experiencing any of these symptoms, cancel your procedure and contact your primary care physician for an evaluation.  Remember:  Regular Business hours are:  Monday to Thursday 8:00 AM to 4:00 PM  Provider's Schedule: Milinda Pointer, MD:  Procedure days: Tuesday and Thursday 7:30 AM to 4:00 PM  Gillis Santa, MD:  Procedure days: Monday and Wednesday 7:30 AM to 4:00 PM  Stop Plavix 7 days before procedure.  ______________________________________________________________________

## 2022-10-29 DIAGNOSIS — E1122 Type 2 diabetes mellitus with diabetic chronic kidney disease: Secondary | ICD-10-CM | POA: Diagnosis not present

## 2022-10-29 DIAGNOSIS — N2581 Secondary hyperparathyroidism of renal origin: Secondary | ICD-10-CM | POA: Diagnosis not present

## 2022-10-29 DIAGNOSIS — R809 Proteinuria, unspecified: Secondary | ICD-10-CM | POA: Diagnosis not present

## 2022-10-29 DIAGNOSIS — N184 Chronic kidney disease, stage 4 (severe): Secondary | ICD-10-CM | POA: Diagnosis not present

## 2022-10-29 DIAGNOSIS — I10 Essential (primary) hypertension: Secondary | ICD-10-CM | POA: Diagnosis not present

## 2022-10-29 DIAGNOSIS — D631 Anemia in chronic kidney disease: Secondary | ICD-10-CM | POA: Diagnosis not present

## 2022-11-03 ENCOUNTER — Telehealth: Payer: Self-pay

## 2022-11-03 NOTE — Telephone Encounter (Signed)
I have her scheduled for the RFA 12/01/22. There was a note to stop blood thinners. Did we get clearance for this?

## 2022-11-04 DIAGNOSIS — E1165 Type 2 diabetes mellitus with hyperglycemia: Secondary | ICD-10-CM | POA: Diagnosis not present

## 2022-11-04 DIAGNOSIS — Z794 Long term (current) use of insulin: Secondary | ICD-10-CM | POA: Diagnosis not present

## 2022-11-05 ENCOUNTER — Telehealth: Payer: Self-pay

## 2022-11-05 NOTE — Progress Notes (Signed)
  Chronic Care Management   Note  11/05/2022 Name: Tonya [name] MRN: [id number] DOB: Jul 09, 1946  Tonya [name] is a 76 y.o. year old female who is a primary care patient of Cannady, [name], NP. I reached out to [name] by phone today in response to a referral sent by Tonya [name] PCP.  Tonya [name]  agreedto scheduling an appointment with the CCM RN Case Manager   Follow up plan: Patient agreed to scheduled appointment with RN Case Manager on 11/18/2022(date/time).   [name], [place], [place] [address] Direct Dial: [phone] [name]

## 2022-11-14 ENCOUNTER — Ambulatory Visit: Payer: Self-pay

## 2022-11-14 NOTE — Telephone Encounter (Signed)
  Chief Complaint: Vertigo Symptoms: Room spinning - HA Frequency: 2 days Pertinent Negatives: Patient denies Weakness Disposition: '[x]'$ ED /'[]'$ Urgent Care (no appt availability in office) / '[]'$ Appointment(In office/virtual)/ '[]'$  [facility] [place]/ '[]'$ Home Care/ '[]'$ Refused Recommended Disposition /'[]'$ [facility]/ '[]'$  Follow-up with PCP Additional Notes: PT reports several episodes of vertigo over the past few days with HA. PT states that sometimes she can be lying down when this occurs or sitting. PT can walk bu must be very careful.  Hx of Diabetes and Kidney issues.    Summary: dizziness/headaches   Patient called in has dizziness, for the past 2 days and headaches.     Reason for Disposition  [1] Dizziness (vertigo) present now AND [2] one or more STROKE RISK FACTORS (i.e., hypertension, diabetes, prior stroke/TIA, heart attack)  (Exception: Prior doctor or NP/PA evaluation for this AND no different/worse than usual.)  Answer Assessment - Initial Assessment Questions 1. DESCRIPTION: "Describe your dizziness."     Room is spinning 2. VERTIGO: "Do you feel like either you or the room is spinning or tilting?"      yes 3. LIGHTHEADED: "Do you feel lightheaded?" (e.g., somewhat faint, woozy, weak upon standing)     no 4. SEVERITY: "How bad is it?"  "Can you walk?"   - MILD: Feels slightly dizzy and unsteady, but is walking normally.   - MODERATE: Feels unsteady when walking, but not falling; interferes with normal activities (e.g., school, work).   - SEVERE: Unable to walk without falling, or requires assistance to walk without falling.     mild 5. ONSET:  "When did the dizziness begin?"     2 days 6. AGGRAVATING FACTORS: "Does anything make it worse?" (e.g., standing, change in head position)     Nothing - needs to be careful getting up 7. CAUSE: "What do you think is causing the dizziness?"     Unsure. 8. RECURRENT SYMPTOM: "Have you had dizziness before?" If Yes, ask:  "When was the last time?" "What happened that time?"     Yes - can't remember when 9. OTHER SYMPTOMS: "Do you have any other symptoms?" (e.g., headache, weakness, numbness, vomiting, earache)     HA 10. PREGNANCY: "Is there any chance you are pregnant?" "When was your last menstrual period?"  Protocols used: Dizziness - Vertigo-A-AH

## 2022-11-14 NOTE — Telephone Encounter (Signed)
Noted and agree with ER.

## 2022-11-17 ENCOUNTER — Inpatient Hospital Stay: Payer: Medicare Other | Attending: Oncology

## 2022-11-17 ENCOUNTER — Inpatient Hospital Stay: Payer: Medicare Other

## 2022-11-17 VITALS — BP 144/84 | HR 93

## 2022-11-17 DIAGNOSIS — N184 Chronic kidney disease, stage 4 (severe): Secondary | ICD-10-CM | POA: Insufficient documentation

## 2022-11-17 DIAGNOSIS — D631 Anemia in chronic kidney disease: Secondary | ICD-10-CM

## 2022-11-17 LAB — HEMOGLOBIN AND HEMATOCRIT, BLOOD
HCT: 33.9 % — ABNORMAL LOW (ref 36.0–46.0)
Hemoglobin: 10.4 g/dL — ABNORMAL LOW (ref 12.0–15.0)

## 2022-11-17 MED ORDER — EPOETIN ALFA-EPBX 10000 UNIT/ML IJ SOLN
10000.0000 [IU] | Freq: Once | INTRAMUSCULAR | Status: AC
  Administered 2022-11-17: 10000 [IU] via SUBCUTANEOUS
  Filled 2022-11-17: qty 1

## 2022-11-18 ENCOUNTER — Telehealth: Payer: Medicare Other

## 2022-11-18 ENCOUNTER — Telehealth: Payer: Self-pay

## 2022-11-18 ENCOUNTER — Ambulatory Visit (INDEPENDENT_AMBULATORY_CARE_PROVIDER_SITE_OTHER): Payer: Medicare Other

## 2022-11-18 DIAGNOSIS — I5032 Chronic diastolic (congestive) heart failure: Secondary | ICD-10-CM

## 2022-11-18 DIAGNOSIS — E1140 Type 2 diabetes mellitus with diabetic neuropathy, unspecified: Secondary | ICD-10-CM

## 2022-11-18 DIAGNOSIS — I10 Essential (primary) hypertension: Secondary | ICD-10-CM

## 2022-11-18 NOTE — Patient Instructions (Addendum)
Please call the care guide team at [phone] if you need to cancel or reschedule your appointment.   If you are experiencing a Mental Health or [place] or need someone to talk to, please call the Suicide and Crisis Lifeline: 988 call the [place] National Suicide Prevention Lifeline: 585-590-8906 or TTY: 417-469-1821 TTY (573)130-3870) to talk to a trained counselor call 1-800-273-TALK (toll free, 24 hour hotline)   Following is a copy of your full provider care plan:   Goals Addressed             This Visit's Progress    CCM Expected Outcome:  Monitor, Self-Manage and Reduce Symptoms of Diabetes       Current Barriers:  Knowledge Deficits related to the importance of normalized blood sugars and A1C WNL to prevent possible complications with kidneys and other body systems  Chronic Disease Management support and education needs related to effective management of DM  Planned Interventions: Provided education to patient about basic DM disease process; Reviewed medications with patient and discussed importance of medication adherence;        Reviewed prescribed diet with patient heart healthy/ADA diet; Counseled on importance of regular laboratory monitoring as prescribed;        Discussed plans with patient for ongoing care management follow up and provided patient with direct contact information for care management team;      Provided patient with written educational materials related to hypo and hyperglycemia and importance of correct treatment;       Reviewed scheduled/upcoming provider appointments including: 02-06-2023 at 220 pm;         Advised patient, providing education and rationale, to check cbg once daily and when you have symptoms of low or high blood sugar and record. The patient states that her blood sugars have been good but could not provide numbers. The patient sees endocrinologist also. Discussed the goal of A1C. Most recent A1C 8.5 in August of 2023. The  patient states she is supposed to have new blood work but missed her appointment. Encouraged the patient to get a new appointment to have updated blood work for effective management of DM        call provider for findings outside established parameters;       Review of patient status, including review of consultants reports, relevant laboratory and other test results, and medications completed;       Advised patient to discuss changes in DM, questions, and concerns with provider;      Screening for signs and symptoms of depression related to chronic disease state;        Assessed social determinant of health barriers;        The patient has had a new onset of dizziness recently and has been changing her position slowly. Assessment completed. The patient denies any issues with low blood sugars. The patient states that her sugars have not been low. The patient has not taken  her blood pressure when the dizzy spells occur. Education on changing position slowly and when it is safe to do so check her blood sugars and blood pressure. Education on orthostatic hypotension. Collaboration with the pcp and advised the patient it was safe to take OTC meclizine for dizziness. Review of safety concerns and to call the office for changes. Also will attach Epleys maneuver information in myChart for the patient to try to see if this will help for her dizziness.   Symptom Management: Take medications as prescribed   Attend  all scheduled provider appointments Call provider office for new concerns or questions  call the Suicide and Crisis Lifeline: 988 call the [place] National Suicide Prevention Lifeline: 828-172-4530 or TTY: (502) 281-0613 TTY (302) 224-6872) to talk to a trained counselor call 1-800-273-TALK (toll free, 24 hour hotline) if experiencing a Mental Health or [place]  check blood sugar at prescribed times: once daily and when you have symptoms of low or high blood sugar check feet daily for  cuts, sores or redness trim toenails straight across manage portion size wash and dry feet carefully every day wear comfortable, cotton socks wear comfortable, well-fitting shoes  Follow Up Plan: Telephone follow up appointment with care management team member scheduled for: 02-10-2023 at 1030 am       CCM Expected Outcome:  Monitor, Self-Manage and Reduce Symptoms of Heart Failure       Current Barriers:  Knowledge Deficits related to monitoring for fluid shifts and wearing compression hose to help with extra fluid on board in legs and feet Chronic Disease Management support and education needs related to effective management of HF  Planned Interventions: Basic overview and discussion of pathophysiology of Heart Failure reviewed Provided education on low sodium diet. Patient states she has not had an appetite but it seems to be getting better than what it was. Education and support given Reviewed Heart Failure Action Plan in depth and provided written copy. The patient states she has not had swelling in her feet and legs in a while. She states that she is doing well and denies any acute findings today. She has not been having to wear her compression stocking and states her legs have been "flat". Education and support given Assessed need for readable accurate scales in home Provided education about placing scale on hard, flat surface Advised patient to weigh each morning after emptying bladder Discussed importance of daily weight and advised patient to weigh and record daily Reviewed role of diuretics in prevention of fluid overload and management of heart failure Discussed the importance of keeping all appointments with provider Provided patient with education about the role of exercise in the management of heart failure Advised patient to discuss changes in HF or heart health with provider Screening for signs and symptoms of depression related to chronic disease state  Assessed social  determinant of health barriers EF is 60-65%  Symptom Management: Take medications as prescribed   Attend all scheduled provider appointments Call provider office for new concerns or questions  call the Suicide and Crisis Lifeline: 988 call the [place] National Suicide Prevention Lifeline: 980-792-7067 or TTY: 267-287-6476 TTY 6394315785) to talk to a trained counselor call 1-800-273-TALK (toll free, 24 hour hotline) if experiencing a Mental Health or [place]  call office if I gain more than 2 pounds in one day or 5 pounds in one week use salt in moderation watch for swelling in feet, ankles and legs every day weigh myself daily develop a rescue plan follow rescue plan if symptoms flare-up track symptoms and what helps feel better or worse dress right for the weather, hot or cold  Follow Up Plan: Telephone follow up appointment with care management team member scheduled for: 02-10-2023 at 1030 am       CCM Expected Outcome:  Monitor, Self-Manage, and Reduce Symptoms of Hypertension       Current Barriers:  Knowledge Deficits related to checking blood pressures on a regular bases to pick up on trends and changes that could impact overall health and  well being Chronic Disease Management support and education needs related to effective management of HTN  BP Readings from Last 3 Encounters:  11/17/22 (!) 144/84  10/23/22 (!) 161/87  10/20/22 (!) 152/90     Planned Interventions: Evaluation of current treatment plan related to hypertension self management and patient's adherence to plan as established by provider. The patient has not been checking her blood pressures at home but states she feels like they have been good. The patient has been having new onset of dizziness and is noticing this when she is changing positions. Education provided. Discussed safety and falls prevention. ;   Provided education to patient re: stroke prevention, s/s of heart attack and  stroke; Reviewed prescribed diet heart healthy/ADA diet  Reviewed medications with patient and discussed importance of compliance. The patient states compliance with medications. ;  Discussed plans with patient for ongoing care management follow up and provided patient with direct contact information for care management team; Advised patient, providing education and rationale, to monitor blood pressure daily and record, calling PCP for findings outside established parameters;  Reviewed scheduled/upcoming provider appointments including: 02-06-2023 at 220 pm Advised patient to discuss changes in HTN or heart health with provider; Provided education on prescribed diet heart healthy/ADA diet ;  Discussed complications of poorly controlled blood pressure such as heart disease, stroke, circulatory complications, vision complications, kidney impairment, sexual dysfunction;  Collaboration with the pcp about the new onset of dizziness the patient is having. The patient states that she has noticed this recently when she gets up from sitting or changes position. Review of Epley's maneuver and OTC medications as recommended by the pcp. Screening for signs and symptoms of depression related to chronic disease state;  Assessed social determinant of health barriers;   Symptom Management: Take medications as prescribed   Attend all scheduled provider appointments Call provider office for new concerns or questions  call the Suicide and Crisis Lifeline: 988 call the [place] National Suicide Prevention Lifeline: 214-627-5085 or TTY: 575-738-2351 TTY 410-788-0281) to talk to a trained counselor call 1-800-273-TALK (toll free, 24 hour hotline) if experiencing a Mental Health or [place]  check blood pressure weekly learn about high blood pressure keep a blood pressure log take blood pressure log to all doctor appointments call doctor for signs and symptoms of high blood pressure develop an action  plan for high blood pressure keep all doctor appointments take medications for blood pressure exactly as prescribed report new symptoms to your doctor  Follow Up Plan: Telephone follow up appointment with care management team member scheduled for: 02-10-2023 at 1030 am          Patient verbalizes understanding of instructions and care plan provided today and agrees to view in [place]. Active MyChart status and patient understanding of how to access instructions and care plan via MyChart confirmed with patient.     Telephone follow up appointment with care management team member scheduled for: 02-10-2023 at 1030 am  Hemoglobin A1C Test Why am I having this test? You may have the hemoglobin A1C test (A1C test) done to: Check your risk for developing diabetes. Diagnose diabetes. Check the long-term control of blood sugar (glucose) in people who have diabetes and help make treatment decisions. This test may be done with other blood glucose tests, such as fasting blood glucose and oral glucose tolerance tests. What is being tested? Hemoglobin is a type of protein in the blood that carries oxygen. Glucose attaches to hemoglobin to form glycated  hemoglobin. This test checks the amount of glycated hemoglobin in your blood. This is a good indicator of the average amount of glucose in your blood during the past 2-3 months. What kind of sample is taken?  A blood sample is required for this test. It is usually collected by inserting a needle into a blood vessel. Tell a health care provider about: All medicines you are taking, including vitamins, herbs, eye drops, creams, and over-the-counter medicines. Any blood disorders you have. Any surgeries you have had. Any medical conditions you have. Whether you are pregnant or may be pregnant. How are the results reported? Your results will be reported as a percentage indicating how much of your hemoglobin has glucose attached to it. Your health care  provider will compare your results to normal ranges established after testing a large group of people (reference ranges). Reference ranges may vary among labs and hospitals. For this test, common reference ranges are: Adult or child without diabetes: 4-5.6%. Adult or child with prediabetes: 5.7%-6.4%. Adult or child with diabetes: 6.5% or higher. What do the results mean? If you do not have diabetes: A result within the reference range means that you are not at high risk for diabetes. A result of 5.7-6.4% means that you have a high risk of developing diabetes, and you have prediabetes. Having prediabetes puts you at risk for developing type 2 diabetes. You may have more tests, including a repeat A1C test. Results of 6.5% or higher on two separate A1C tests mean that you have diabetes. You may have more tests to confirm the diagnosis. If you have diabetes: A result of 7% usually means that your diabetes is under control. A result of less than 7% also means that diabetes is under control. This result may be an appropriate goal for those for whom there is a low risk of low blood sugar (hypoglycemia). A result of less than 8% may also mean that diabetes is under control. This result may be an appropriate goal for those who do not benefit from more blood glucose control or who are at high risk for hypoglycemia. Abnormally low A1C values may be caused by: Pregnancy. Severe blood loss. Receiving donated blood (transfusions). Low red blood cell count (anemia). Long-term kidney failure. Some unusual forms (variants) of hemoglobin. Talk with your health care provider about what your results mean. Questions to ask your health care provider Ask your health care provider, or the department that is doing the test: When will my results be ready? How will I get my results? What are my treatment options? What other tests do I need? What are my next steps? Summary The A1C test is done to check your risk  for developing diabetes, diagnose diabetes, and check the long-term control of blood sugar (glucose) in people who have diabetes and help make treatment decisions. Hemoglobin is a type of protein in the blood that carries oxygen. Glucose attaches to hemoglobin to form glycated hemoglobin. This test checks the amount of glycated hemoglobin in your blood. Talk with your health care provider about what your results mean. This information is not intended to replace advice given to you by your health care provider. Make sure you discuss any questions you have with your health care provider. Document Revised: 07/08/2022 Document Reviewed: 01/15/2022 Elsevier Patient Education  [place] Tablets What is this medication? MECLIZINE (MEK li zeen) prevents and treats nausea, vomiting, and dizziness caused by motion sickness. It works by Building control surveyor  maintain its sense of balance. It belongs to a group of medications called antihistamines. This medicine may be used for other purposes; ask your health care provider or pharmacist if you have questions. COMMON BRAND NAME(S): Antivert, Dramamine Less Drowsy, Dramamine-N, Medivert, Meni-D What should I tell my care team before I take this medication? They need to know if you have any of these conditions: Glaucoma Lung or breathing disease, such as asthma Problems urinating Prostate disease Stomach or intestine problems An unusual or allergic reaction to meclizine, other medications, foods, dyes, or preservatives Pregnant or trying to get pregnant Breast-feeding How should I use this medication? Take this medication by mouth with water. Take it as directed on the prescription label at the same time every day. If you are using this medication to prevent motion sickness, take the dose at least 1 hour before travel. You can take it with or without food. If it upsets your stomach, take it with food or milk. Talk to your care team about  the use of this medication in children. Special care may be needed. Overdosage: If you think you have taken too much of this medicine contact a poison control center or emergency room at once. NOTE: This medicine is only for you. Do not share this medicine with others. What if I miss a dose? If you miss a dose, take it as soon as you can. If it is almost time for your next dose, take only that dose. Do not take double or extra doses. What may interact with this medication? Do not take this medication with any of the following: MAOIs, such as Carbex, Eldepryl, Marplan, Nardil, and Parnate This medication may also interact with the following: Alcohol Antihistamines for allergy, cough and cold Certain medications for anxiety or sleep Certain medications for depression, such as amitriptyline, fluoxetine, sertraline Certain medications for seizures, such as phenobarbital, primidone General anesthetics, such as halothane, isoflurane, methoxyflurane, propofol Local anesthetics, such as lidocaine, pramoxine, tetracaine Medications that relax muscles for surgery Opioid medications for pain Phenothiazines, such as chlorpromazine, mesoridazine, prochlorperazine, thioridazine This list may not describe all possible interactions. Give your health care provider a list of all the medicines, herbs, non-prescription drugs, or dietary supplements you use. Also tell them if you smoke, drink alcohol, or use illegal drugs. Some items may interact with your medicine. What should I watch for while using this medication? Visit your care team for regular checks on your progress. Tell your care team if your symptoms do not start to get better or if they get worse. This medication may affect your coordination, reaction time, or judgment. Do not drive or operate machinery until you know how this medication affects you. Sit up or stand slowly to reduce the risk of dizzy or fainting spells. Drinking alcohol with this  medication can increase the risk of these side effects. Your mouth may get dry. Chewing sugarless gum or sucking hard candy, and drinking plenty of water may help. Contact your care team if the problem does not go away or is severe. This medication may cause dry eyes and blurred vision. If you wear contact lenses, you may feel some discomfort. Lubricating drops may help. See your care team if the problem does not go away or is severe. What side effects may I notice from receiving this medication? Side effects that you should report to your care team as soon as possible: Allergic reactions--skin rash, itching, hives, swelling of the face, lips, tongue, or throat Heart rhythm changes--fast  or irregular heartbeat, dizziness, feeling faint or lightheaded, chest pain, trouble breathing Sudden eye pain or change in vision such as blurry vision, seeing halos around lights, vision loss Trouble passing urine Side effects that usually do not require medical attention (report to your care team if they continue or are bothersome): Confusion Constipation Dizziness Drowsiness Dry mouth This list may not describe all possible side effects. Call your doctor for medical advice about side effects. You may report side effects to FDA at 1-800-FDA-1088. Where should I keep my medication? Keep out of the reach of children and pets. Store at room temperature between 15 and 30 degrees C (59 and 86 degrees F). Keep container tightly closed. Get rid of any unused medication after the expiration date. NOTE: This sheet is a summary. It may not cover all possible information. If you have questions about this medicine, talk to your doctor, pharmacist, or health care provider.  2023 Elsevier/Gold Standard (2008-01-22 00:00:00)   How to Perform the Epley Maneuver The Epley maneuver is an exercise that relieves symptoms of vertigo. Vertigo is the feeling that you or your surroundings are moving when they are not. When you  feel vertigo, you may feel like the room is spinning and may have trouble walking. The Epley maneuver is used for a type of vertigo caused by a calcium deposit in a part of the inner ear. The maneuver involves changing head positions to help the deposit move out of the area. You can do this maneuver at home whenever you have symptoms of vertigo. You can repeat it in 24 hours if your vertigo has not gone away. Even though the Epley maneuver may relieve your vertigo for a few weeks, it is possible that your symptoms will return. This maneuver relieves vertigo, but it does not relieve dizziness. What are the risks? If it is done correctly, the Epley maneuver is considered safe. Sometimes it can lead to dizziness or nausea that goes away after a short time. If you develop other symptoms--such as changes in vision, weakness, or numbness--stop doing the maneuver and call your health care provider. Supplies needed: A bed or table. A pillow. How to do the Epley maneuver     Sit on the edge of a bed or table with your back straight and your legs extended or hanging over the edge of the bed or table. Turn your head halfway toward the affected ear or side as told by your health care provider. Lie backward quickly with your head turned until you are lying flat on your back. Your head should dangle (head-hanging position). You may want to position a pillow under your shoulders. Hold this position for at least 30 seconds. If you feel dizzy or have symptoms of vertigo, continue to hold the position until the symptoms stop. Turn your head to the opposite direction until your unaffected ear is facing down. Your head should continue to dangle. Hold this position for at least 30 seconds. If you feel dizzy or have symptoms of vertigo, continue to hold the position until the symptoms stop. Turn your whole body to the same side as your head so that you are positioned on your side. Your head will now be nearly facedown  and no longer needs to dangle. Hold for at least 30 seconds. If you feel dizzy or have symptoms of vertigo, continue to hold the position until the symptoms stop. Sit back up. You can repeat the maneuver in 24 hours if your vertigo does not  go away. Follow these instructions at home: For 24 hours after doing the Epley maneuver: Keep your head in an upright position. When lying down to sleep or rest, keep your head raised (elevated) with two or more pillows. Avoid excessive neck movements. Activity Do not drive or use machinery if you feel dizzy. After doing the Epley maneuver, return to your normal activities as told by your health care provider. Ask your health care provider what activities are safe for you. General instructions Drink enough fluid to keep your urine pale yellow. Do not drink alcohol. Take over-the-counter and prescription medicines only as told by your health care provider. Keep all follow-up visits. This is important. Preventing vertigo symptoms Ask your health care provider if there is anything you should do at home to prevent vertigo. He or she may recommend that you: Keep your head elevated with two or more pillows while you sleep. Do not sleep on the side of your affected ear. Get up slowly from bed. Avoid sudden movements during the day. Avoid extreme head positions or movement, such as looking up or bending over. Contact a health care provider if: Your vertigo gets worse. You have other symptoms, including: Nausea. Vomiting. Headache. Get help right away if you: Have vision changes. Have a headache or neck pain that is severe or getting worse. Cannot stop vomiting. Have new numbness or weakness in any part of your body. These symptoms may represent a serious problem that is an emergency. Do not wait to see if the symptoms will go away. Get medical help right away. Call your local emergency services (911 in the [place] Do not drive yourself to the  hospital. Summary Vertigo is the feeling that you or your surroundings are moving when they are not. The Epley maneuver is an exercise that relieves symptoms of vertigo. If the Epley maneuver is done correctly, it is considered safe. This information is not intended to replace advice given to you by your health care provider. Make sure you discuss any questions you have with your health care provider. Document Revised: 10/31/2020 Document Reviewed: 10/31/2020 Elsevier Patient Education  [place].

## 2022-11-18 NOTE — Plan of Care (Signed)
Chronic Care Management Provider Comprehensive Care Plan    11/18/2022 Name: Tonya [name] MRN: [id number] DOB: 12-Nov-1946  Referral to Chronic Care Management (CCM) services was placed by Provider:  [name], NP, on Date: 10-22-2022.  Chronic Condition 1: DM Provider Assessment and Plan   Chronic, ongoing, followed by endocrinology.  A1c 8.5% recently and urine ALB up to date with nephrology. Continue current medication regimen and defer changes to endo.  Cardiology and PCP have recommended to endo discontinuing Actos due to her HF.  Continue to collaborate with Dr. [name] and CCM team. Recommend she monitor BS at least 3 times a day at home, continue consistent [place] use.  Return in 6 months as is being followed closely by endo.            Expected Outcome/Goals Addressed This Visit (Provider CCM goals/Provider Assessment and plan   CCM (Diabetes)  EXPECTED OUTCOME:  MONITOR, SELF-MANAGE AND REDUCE SYMPTOMS OF Diabetes   Symptom Management Condition 1: Take all medications as prescribed Attend all scheduled provider appointments Call provider office for new concerns or questions  call the Suicide and Crisis Lifeline: 988 call the [place] National Suicide Prevention Lifeline: (301)413-4252 or TTY: (438) 506-4920 TTY (812)250-9090) to talk to a trained counselor call 1-800-273-TALK (toll free, 24 hour hotline) if experiencing a Mental Health or [place]  check feet daily for cuts, sores or redness trim toenails straight across manage portion size wash and dry feet carefully every day wear comfortable, cotton socks wear comfortable, well-fitting shoes  Chronic Condition 2: HTN Provider Assessment and Plan   Chronic, ongoing. BP at goal today in office on recheck and on home readings.  Continue current medication regimen and collaboration with nephrology.  Labs TSH today and reviewed in Care Everywhere recent labs.  Recommend she continue to monitor BP at home  daily and document for providers + focus on DASH diet.            Relevant Orders    TSH         Expected Outcome/Goals Addressed This Visit (Provider CCM goals/Provider Assessment and plan   CCM (HTN)  EXPECTED OUTCOME:  MONITOR, SELF-MANAGE AND REDUCE SYMPTOMS OF Hypertension   Symptom Management Condition 2: Take all medications as prescribed Attend all scheduled provider appointments Call provider office for new concerns or questions  call the Suicide and Crisis Lifeline: 988 call the [place] National Suicide Prevention Lifeline: 530 225 8638 or TTY: 305 162 9036 TTY 430 618 3078) to talk to a trained counselor call 1-800-273-TALK (toll free, 24 hour hotline) if experiencing a Mental Health or [place]  check blood pressure weekly write blood pressure results in a log or diary learn about high blood pressure keep a blood pressure log take blood pressure log to all doctor appointments call doctor for signs and symptoms of high blood pressure keep all doctor appointments take medications for blood pressure exactly as prescribed report new symptoms to your doctor   Chronic Condition 3: HF Provider Assessment and Plan   Chronic, ongoing with EF 60-65%, euvolemic at this time.  Continue current medication regimen and collaboration with cardiology + CCM team in office.  - Reminded to call for an overnight weight gain of >2 pounds or a weekly weight weight of >5 pounds -- work with CCM team on obtaining scale. - not adding salt to his food and has been reading food labels. Reviewed the importance of keeping daily sodium intake to '2000mg'$  daily  - Wear compression hose daily --  not on today and not consistently wearing - No Ibuprofen at home         Relevant Orders    Magnesium     Expected Outcome/Goals Addressed This Visit (Provider CCM goals/Provider Assessment and plan   CCM (HEART FAILURE)  EXPECTED OUTCOME:  MONITOR, SELF-MANAGE AND REDUCE SYMPTOMS OF  HEART FAILURE   Symptom Management Condition 3: Take all medications as prescribed Attend all scheduled provider appointments Call provider office for new concerns or questions  call the Suicide and Crisis Lifeline: 988 call the [place] National Suicide Prevention Lifeline: (316) 876-0448 or TTY: 443-531-4229 TTY 731-747-1804) to talk to a trained counselor call 1-800-273-TALK (toll free, 24 hour hotline) if experiencing a Mental Health or [place]  call office if I gain more than 2 pounds in one day or 5 pounds in one week keep legs up while sitting track weight in diary use salt in moderation watch for swelling in feet, ankles and legs every day weigh myself daily develop a rescue plan follow rescue plan if symptoms flare-up track symptoms and what helps feel better or worse dress right for the weather, hot or cold Problem List Patient Active Problem List   Diagnosis Date Noted   S/P repair of paraesophageal hernia 08/26/2022   Hiatal hernia 07/26/2022   Anemia in stage 4 chronic kidney disease ([place]) 04/15/2022   H/O sarcoidosis 01/29/2022   Ganglion cyst of wrist, left 01/29/2022   Lumbar degenerative disc disease 06/07/2020   Lumbar spondylosis 06/07/2020   Chronic pain syndrome 04/18/2020   Sacroiliac joint pain 04/18/2020   Chronic heart failure with preserved ejection fraction (HFpEF) ([place]) 01/11/2020   Grade I diastolic dysfunction 10/62/6948   History of CVA (cerebrovascular accident) 12/01/2019   Hyperparathyroidism due to renal insufficiency ([place]) 08/31/2019   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[place]) 09/22/2017   Advanced care planning/counseling discussion 04/13/2017   Post herpetic neuralgia 10/10/2016   Hip bursitis 09/18/2015   Poorly controlled type 2 diabetes mellitus with neuropathy ([place]) 05/01/2015   Hyperlipidemia associated with type 2 diabetes mellitus ([place]) 05/01/2015   Chronic kidney disease, stage 4, severely  decreased GFR ([place]) 05/01/2015   Encounter for long-term (current) use of insulin ([place]) 05/01/2015    Medication Management  Current Outpatient Medications:    acetaminophen (TYLENOL) 650 MG CR tablet, Take 650 mg by mouth every 8 (eight) hours as needed for pain., Disp: , Rfl:    aspirin 81 MG tablet, Take 81 mg by mouth daily., Disp: , Rfl:    carvedilol (COREG) 3.125 MG tablet, TAKE 1 TABLET BY MOUTH TWICE  DAILY WITH MEALS, Disp: 180 tablet, Rfl: 3   clopidogrel (PLAVIX) 75 MG tablet, TAKE 1 TABLET BY MOUTH  DAILY, Disp: 90 tablet, Rfl: 4   docusate sodium (COLACE) 100 MG capsule, Take 100 mg by mouth daily as needed for mild constipation., Disp: , Rfl:    ibuprofen (ADVIL) 600 MG tablet, Take 1 tablet (600 mg total) by mouth every 6 (six) hours as needed., Disp: 30 tablet, Rfl: 0   methocarbamol (ROBAXIN) 500 MG tablet, Take 1 tablet (500 mg total) by mouth daily as needed for muscle spasms., Disp: 30 tablet, Rfl: 2   naloxone (NARCAN) nasal spray 4 mg/0.1 mL, SMARTSIG:Both Nares, Disp: , Rfl:    NOVOFINE 32G X 6 MM MISC, USE AS DIRECTED THREE TIMES DAILY WITH HUMALOG, Disp: 100 each, Rfl: 3   olmesartan (BENICAR) 40 MG tablet, Take 1 tablet (40 mg total)  by mouth daily. (Patient taking differently: Take 40 mg by mouth every morning.), Disp: 90 tablet, Rfl: 4   omeprazole (PRILOSEC) 40 MG capsule, Take 1 capsule (40 mg total) by mouth in the morning and at bedtime., Disp: 180 capsule, Rfl: 0   rosuvastatin (CRESTOR) 5 MG tablet, Take 1 tablet (5 mg total) by mouth daily. (Patient taking differently: Take 5 mg by mouth every morning.), Disp: 90 tablet, Rfl: 3   torsemide (DEMADEX) 20 MG tablet, Take 2 tablets (40 mg total) by mouth daily. (Patient taking differently: Take 40 mg by mouth every morning.), Disp: 180 tablet, Rfl: 3   vitamin B-12 (CYANOCOBALAMIN) 100 MCG tablet, Take 100 mcg by mouth daily., Disp: , Rfl:    Vitamin D, Cholecalciferol, 50 MCG (2000 UT) CAPS, Take 2,000 Units by  mouth daily., Disp: , Rfl:   Cognitive Assessment Identity Confirmed: : DOB; Name Cognitive Status: Normal   Functional Assessment Hearing Difficulty or Deaf: no Wear Glasses or Blind: yes Vision Management: wears glasses Concentrating, Remembering or Making Decisions Difficulty (CP): no Difficulty Communicating: no Difficulty Eating/Swallowing: no Walking or Climbing Stairs Difficulty: yes Walking or Climbing Stairs: ambulation difficulty, requires equipment Mobility Management: uses her cane, has vertigo Dressing/Bathing Difficulty: no Doing Errands Independently Difficulty (such as shopping) (CP): no Change in Functional Status Since Onset of Current Illness/Injury: no   Caregiver Assessment  Primary Source of Support/Comfort: spouse; child(ren) Name of Support/Comfort Primary Source: husband and daughter People in Home: spouse Name(s) of People in Home: Joe- husband Family Caregiver if Needed: none Primary Roles/Responsibilities: retired   Planned Interventions  Provided education to patient about basic DM disease process; Reviewed medications with patient and discussed importance of medication adherence;        Reviewed prescribed diet with patient heart healthy/ADA diet; Counseled on importance of regular laboratory monitoring as prescribed;        Discussed plans with patient for ongoing care management follow up and provided patient with direct contact information for care management team;      Provided patient with written educational materials related to hypo and hyperglycemia and importance of correct treatment;       Reviewed scheduled/upcoming provider appointments including: 02-06-2023 at 220 pm;         Advised patient, providing education and rationale, to check cbg once daily and when you have symptoms of low or high blood sugar and record. The patient states that her blood sugars have been good but could not provide numbers. The patient sees endocrinologist  also. Discussed the goal of A1C. Most recent A1C 8.5 in August of 2023. The patient states she is supposed to have new blood work but missed her appointment. Encouraged the patient to get a new appointment to have updated blood work for effective management of DM        call provider for findings outside established parameters;       Review of patient status, including review of consultants reports, relevant laboratory and other test results, and medications completed;       Advised patient to discuss changes in DM, questions, and concerns with provider;      Screening for signs and symptoms of depression related to chronic disease state;        Assessed social determinant of health barriers;        The patient has had a new onset of dizziness recently and has been changing her position slowly. Assessment completed. The patient denies any issues with low blood  sugars. The patient states that her sugars have not been low. The patient has not taken  her blood pressure when the dizzy spells occur. Education on changing position slowly and when it is safe to do so check her blood sugars and blood pressure. Education on orthostatic hypotension. Collaboration with the pcp and advised the patient it was safe to take OTC meclizine for dizziness. Review of safety concerns and to call the office for changes. Also will attach Epleys maneuver information in myChart for the patient to try to see if this will help for her dizziness.  Evaluation of current treatment plan related to hypertension self management and patient's adherence to plan as established by provider. The patient has not been checking her blood pressures at home but states she feels like they have been good. The patient has been having new onset of dizziness and is noticing this when she is changing positions. Education provided. Discussed safety and falls prevention. ;   Provided education to patient re: stroke prevention, s/s of heart attack and  stroke; Reviewed prescribed diet heart healthy/ADA diet  Reviewed medications with patient and discussed importance of compliance. The patient states compliance with medications. ;  Discussed plans with patient for ongoing care management follow up and provided patient with direct contact information for care management team; Advised patient, providing education and rationale, to monitor blood pressure daily and record, calling PCP for findings outside established parameters;  Reviewed scheduled/upcoming provider appointments including: 02-06-2023 at 220 pm Advised patient to discuss changes in HTN or heart health with provider; Provided education on prescribed diet heart healthy/ADA diet ;  Discussed complications of poorly controlled blood pressure such as heart disease, stroke, circulatory complications, vision complications, kidney impairment, sexual dysfunction;  Collaboration with the pcp about the new onset of dizziness the patient is having. The patient states that she has noticed this recently when she gets up from sitting or changes position. Review of Epley's maneuver and OTC medications as recommended by the pcp. Screening for signs and symptoms of depression related to chronic disease state;  Assessed social determinant of health barriers;  Basic overview and discussion of pathophysiology of Heart Failure reviewed Provided education on low sodium diet. Patient states she has not had an appetite but it seems to be getting better than what it was. Education and support given Reviewed Heart Failure Action Plan in depth and provided written copy. The patient states she has not had swelling in her feet and legs in a while. She states that she is doing well and denies any acute findings today. She has not been having to wear her compression stocking and states her legs have been "flat". Education and support given Assessed need for readable accurate scales in home Provided education about  placing scale on hard, flat surface Advised patient to weigh each morning after emptying bladder Discussed importance of daily weight and advised patient to weigh and record daily Reviewed role of diuretics in prevention of fluid overload and management of heart failure Discussed the importance of keeping all appointments with provider Provided patient with education about the role of exercise in the management of heart failure Advised patient to discuss changes in HF or heart health with provider Screening for signs and symptoms of depression related to chronic disease state  Assessed social determinant of health barriers EF is 60-65%    Interaction and coordination with outside resources, practitioners, and providers See CCM Referral  Care Plan: Available in MyChart

## 2022-11-18 NOTE — Telephone Encounter (Signed)
   CCM RN Visit Note   11-18-2022 Name: Tonya [name] MRN: [id number]      DOB: 1946-01-15  Subjective: Tonya [name] is a 76 y.o. year old female who is a primary care patient of [name], NP The patient was referred to the Chronic Care Management team for assistance with care management needs subsequent to provider initiation of CCM services and plan of care.      An unsuccessful telephone outreach was attempted today to contact the patient about Chronic Care Management needs.  Incoming call from the patient returning RNCM call. See new encounter.  Plan:Telephone follow up appointment with care management team member scheduled for:  February 2024  Fransisco Beau, MSN, CCM RN Care Manager  Chronic Care Management Direct Number: [phone]

## 2022-11-18 NOTE — Chronic Care Management (AMB) (Signed)
Chronic Care Management   CCM RN Visit Note  11/18/2022 Name: Tonya [name] MRN: [id number] DOB: [birth year]  Subjective: Tonya [name] is a 76 y.o. year old female who is a primary care patient of Cannady, [name], NP. The patient was referred to the Chronic Care Management team for assistance with care management needs subsequent to provider initiation of CCM services and plan of care.    Today's Visit:  Engaged with patient by telephone for initial visit.     SDOH Interventions Today    Flowsheet Row Most Recent Value  SDOH Interventions   Food Insecurity Interventions Intervention Not Indicated  Housing Interventions Intervention Not Indicated  Transportation Interventions Intervention Not Indicated  Utilities Interventions Intervention Not Indicated  Alcohol Usage Interventions Intervention Not Indicated (Score <7)  Financial Strain Interventions Intervention Not Indicated  Physical Activity Interventions Other (Comments)  [no structured activity, encouraged activity]  Stress Interventions Intervention Not Indicated  Social Connections Interventions Intervention Not Indicated         Goals Addressed             This Visit's Progress    CCM Expected Outcome:  Monitor, Self-Manage and Reduce Symptoms of Diabetes       Current Barriers:  Knowledge Deficits related to the importance of normalized blood sugars and A1C WNL to prevent possible complications with kidneys and other body systems  Chronic Disease Management support and education needs related to effective management of DM  Planned Interventions: Provided education to patient about basic DM disease process; Reviewed medications with patient and discussed importance of medication adherence;        Reviewed prescribed diet with patient heart healthy/ADA diet; Counseled on importance of regular laboratory monitoring as prescribed;        Discussed plans with patient for ongoing care management follow up and  provided patient with direct contact information for care management team;      Provided patient with written educational materials related to hypo and hyperglycemia and importance of correct treatment;       Reviewed scheduled/upcoming provider appointments including: 02-06-2023 at 220 pm;         Advised patient, providing education and rationale, to check cbg once daily and when you have symptoms of low or high blood sugar and record. The patient states that her blood sugars have been good but could not provide numbers. The patient sees endocrinologist also. Discussed the goal of A1C. Most recent A1C 8.5 in August of 2023. The patient states she is supposed to have new blood work but missed her appointment. Encouraged the patient to get a new appointment to have updated blood work for effective management of DM        call provider for findings outside established parameters;       Review of patient status, including review of consultants reports, relevant laboratory and other test results, and medications completed;       Advised patient to discuss changes in DM, questions, and concerns with provider;      Screening for signs and symptoms of depression related to chronic disease state;        Assessed social determinant of health barriers;        The patient has had a new onset of dizziness recently and has been changing her position slowly. Assessment completed. The patient denies any issues with low blood sugars. The patient states that her sugars have not been low. The patient has not taken  her  blood pressure when the dizzy spells occur. Education on changing position slowly and when it is safe to do so check her blood sugars and blood pressure. Education on orthostatic hypotension. Collaboration with the pcp and advised the patient it was safe to take OTC meclizine for dizziness. Review of safety concerns and to call the office for changes. Also will attach Epleys maneuver information in myChart for  the patient to try to see if this will help for her dizziness.   Symptom Management: Take medications as prescribed   Attend all scheduled provider appointments Call provider office for new concerns or questions  call the Suicide and Crisis Lifeline: 988 call the [place] National Suicide Prevention Lifeline: 762-679-5249 or TTY: (979)102-6450 TTY 210-281-8911) to talk to a trained counselor call 1-800-273-TALK (toll free, 24 hour hotline) if experiencing a Mental Health or [place]  check blood sugar at prescribed times: once daily and when you have symptoms of low or high blood sugar check feet daily for cuts, sores or redness trim toenails straight across manage portion size wash and dry feet carefully every day wear comfortable, cotton socks wear comfortable, well-fitting shoes  Follow Up Plan: Telephone follow up appointment with care management team member scheduled for: 02-10-2023 at 1030 am       CCM Expected Outcome:  Monitor, Self-Manage and Reduce Symptoms of Heart Failure       Current Barriers:  Knowledge Deficits related to monitoring for fluid shifts and wearing compression hose to help with extra fluid on board in legs and feet Chronic Disease Management support and education needs related to effective management of HF  Planned Interventions: Basic overview and discussion of pathophysiology of Heart Failure reviewed Provided education on low sodium diet. Patient states she has not had an appetite but it seems to be getting better than what it was. Education and support given Reviewed Heart Failure Action Plan in depth and provided written copy. The patient states she has not had swelling in her feet and legs in a while. She states that she is doing well and denies any acute findings today. She has not been having to wear her compression stocking and states her legs have been "flat". Education and support given Assessed need for readable accurate scales in  home Provided education about placing scale on hard, flat surface Advised patient to weigh each morning after emptying bladder Discussed importance of daily weight and advised patient to weigh and record daily Reviewed role of diuretics in prevention of fluid overload and management of heart failure Discussed the importance of keeping all appointments with provider Provided patient with education about the role of exercise in the management of heart failure Advised patient to discuss changes in HF or heart health with provider Screening for signs and symptoms of depression related to chronic disease state  Assessed social determinant of health barriers EF is 60-65%  Symptom Management: Take medications as prescribed   Attend all scheduled provider appointments Call provider office for new concerns or questions  call the Suicide and Crisis Lifeline: 988 call the [place] National Suicide Prevention Lifeline: 470-858-3524 or TTY: (719)794-9440 TTY 813-786-4168) to talk to a trained counselor call 1-800-273-TALK (toll free, 24 hour hotline) if experiencing a Mental Health or [place]  call office if I gain more than 2 pounds in one day or 5 pounds in one week use salt in moderation watch for swelling in feet, ankles and legs every day weigh myself daily develop a rescue plan  follow rescue plan if symptoms flare-up track symptoms and what helps feel better or worse dress right for the weather, hot or cold  Follow Up Plan: Telephone follow up appointment with care management team member scheduled for: 02-10-2023 at 65 am       CCM Expected Outcome:  Monitor, Self-Manage, and Reduce Symptoms of Hypertension       Current Barriers:  Knowledge Deficits related to checking blood pressures on a regular bases to pick up on trends and changes that could impact overall health and well being Chronic Disease Management support and education needs related to effective management of  HTN  BP Readings from Last 3 Encounters:  11/17/22 (!) 144/84  10/23/22 (!) 161/87  10/20/22 (!) 152/90     Planned Interventions: Evaluation of current treatment plan related to hypertension self management and patient's adherence to plan as established by provider. The patient has not been checking her blood pressures at home but states she feels like they have been good. The patient has been having new onset of dizziness and is noticing this when she is changing positions. Education provided. Discussed safety and falls prevention. ;   Provided education to patient re: stroke prevention, s/s of heart attack and stroke; Reviewed prescribed diet heart healthy/ADA diet  Reviewed medications with patient and discussed importance of compliance. The patient states compliance with medications. ;  Discussed plans with patient for ongoing care management follow up and provided patient with direct contact information for care management team; Advised patient, providing education and rationale, to monitor blood pressure daily and record, calling PCP for findings outside established parameters;  Reviewed scheduled/upcoming provider appointments including: 02-06-2023 at 220 pm Advised patient to discuss changes in HTN or heart health with provider; Provided education on prescribed diet heart healthy/ADA diet ;  Discussed complications of poorly controlled blood pressure such as heart disease, stroke, circulatory complications, vision complications, kidney impairment, sexual dysfunction;  Collaboration with the pcp about the new onset of dizziness the patient is having. The patient states that she has noticed this recently when she gets up from sitting or changes position. Review of Epley's maneuver and OTC medications as recommended by the pcp. Screening for signs and symptoms of depression related to chronic disease state;  Assessed social determinant of health barriers;   Symptom Management: Take  medications as prescribed   Attend all scheduled provider appointments Call provider office for new concerns or questions  call the Suicide and Crisis Lifeline: 988 call the [place] National Suicide Prevention Lifeline: 639-127-0754 or TTY: 4380830886 TTY (303)264-9544) to talk to a trained counselor call 1-800-273-TALK (toll free, 24 hour hotline) if experiencing a Mental Health or [place]  check blood pressure weekly learn about high blood pressure keep a blood pressure log take blood pressure log to all doctor appointments call doctor for signs and symptoms of high blood pressure develop an action plan for high blood pressure keep all doctor appointments take medications for blood pressure exactly as prescribed report new symptoms to your doctor  Follow Up Plan: Telephone follow up appointment with care management team member scheduled for: 02-10-2023 at 1030 am          Plan:Telephone follow up appointment with care management team member scheduled for:  02-10-2023 at 62 am  [place], MSN, CCM RN Care Manager  Chronic Care Management Direct Number: [phone]

## 2022-11-24 ENCOUNTER — Other Ambulatory Visit: Payer: Self-pay | Admitting: Nurse Practitioner

## 2022-12-01 ENCOUNTER — Ambulatory Visit
Payer: Medicare Other | Attending: Student in an Organized Health Care Education/Training Program | Admitting: Student in an Organized Health Care Education/Training Program

## 2022-12-01 ENCOUNTER — Encounter: Payer: Self-pay | Admitting: Student in an Organized Health Care Education/Training Program

## 2022-12-01 ENCOUNTER — Ambulatory Visit
Admission: RE | Admit: 2022-12-01 | Discharge: 2022-12-01 | Disposition: A | Discharge status: Home / Self Care | Payer: Medicare Other | Source: Ambulatory Visit | Attending: Student in an Organized Health Care Education/Training Program | Admitting: Student in an Organized Health Care Education/Training Program

## 2022-12-01 DIAGNOSIS — G894 Chronic pain syndrome: Secondary | ICD-10-CM | POA: Insufficient documentation

## 2022-12-01 DIAGNOSIS — M47816 Spondylosis without myelopathy or radiculopathy, lumbar region: Secondary | ICD-10-CM

## 2022-12-01 MED ORDER — ROPIVACAINE HCL 2 MG/ML IJ SOLN
INTRAMUSCULAR | Status: AC
  Filled 2022-12-01: qty 20

## 2022-12-01 MED ORDER — LACTATED RINGERS IV SOLN: Freq: Once | INTRAVENOUS | Status: AC

## 2022-12-01 MED ORDER — DEXAMETHASONE SODIUM PHOSPHATE 10 MG/ML IJ SOLN
INTRAMUSCULAR | Status: AC
  Filled 2022-12-01: qty 1

## 2022-12-01 MED ORDER — ROPIVACAINE HCL 2 MG/ML IJ SOLN
9.0000 mL | Freq: Once | INTRAMUSCULAR | Status: AC
  Administered 2022-12-01: 9 mL via PERINEURAL

## 2022-12-01 MED ORDER — LIDOCAINE HCL 2 % IJ SOLN
20.0000 mL | Freq: Once | INTRAMUSCULAR | Status: AC
  Administered 2022-12-01: 400 mg

## 2022-12-01 MED ORDER — MIDAZOLAM HCL 2 MG/2ML IJ SOLN
INTRAMUSCULAR | Status: AC
  Filled 2022-12-01: qty 2

## 2022-12-01 MED ORDER — LIDOCAINE HCL 2 % IJ SOLN
INTRAMUSCULAR | Status: AC
  Filled 2022-12-01: qty 20

## 2022-12-01 MED ORDER — MIDAZOLAM HCL 5 MG/5ML IJ SOLN
0.5000 mg | Freq: Once | INTRAMUSCULAR | Status: AC
  Administered 2022-12-01: 1 mg via INTRAVENOUS
  Administered 2022-12-01 (×2): 0.5 mg via INTRAVENOUS
  Filled 2022-12-01: qty 2

## 2022-12-01 MED ORDER — DEXAMETHASONE SODIUM PHOSPHATE 10 MG/ML IJ SOLN
10.0000 mg | Freq: Once | INTRAMUSCULAR | Status: AC
  Administered 2022-12-01: 10 mg

## 2022-12-01 NOTE — Progress Notes (Signed)
Safety precautions to be maintained throughout the outpatient stay will include: orient to surroundings, keep bed in low position, maintain call bell within reach at all times, provide assistance with transfer out of bed and ambulation.  

## 2022-12-01 NOTE — Progress Notes (Signed)
PROVIDER NOTE: Interpretation of information contained herein should be left to medically-trained personnel. Specific patient instructions are provided elsewhere under "Patient Instructions" section of medical record. This document was created in part using STT-dictation technology, any transcriptional errors that may result from this process are unintentional.  Patient: Tonya [name] Type: Established DOB: 11-28-1946 MRN: [id number] PCP: [name], NP  Service: Procedure DOS: 12/01/2022 Setting: Ambulatory Location: Ambulatory outpatient facility Delivery: Face-to-face Provider: Gillis Santa, MD Specialty: Interventional Pain Management Specialty designation: 09 Location: Outpatient facility Ref. Prov.: Tonya Santa, MD    Procedure:           Type: Lumbar Facet, Medial Branch Radiofrequency Ablation (RFA) #1  Laterality: Right (-RT)  Level: L3, L4, L5, Medial Branch Level(s). T Imaging: Fluoroscopy-guided         Anesthesia: Local anesthesia (1-2% Lidocaine) Anxiolysis: IV Versed 2.0 mg DOS: 12/01/2022  Performed by: Tonya Santa, MD  Pt d/c'd Plavix 7 days prior  Purpose: Therapeutic/Palliative Indications: Low back pain severe enough to impact quality of life or function. Indications: 1. Lumbar facet arthropathy   2. Lumbar spondylosis   3. Chronic pain syndrome    Tonya [name] has been dealing with the above chronic pain for longer than three months and has either failed to respond, was unable to tolerate, or simply did not get enough benefit from other more conservative therapies including, but not limited to: 1. Over-the-counter medications 2. Anti-inflammatory medications 3. Muscle relaxants 4. Membrane stabilizers 5. Opioids 6. Physical therapy and/or chiropractic manipulation 7. Modalities (Heat, ice, etc.) 8. Invasive techniques such as nerve blocks. [name] has attained more than 50% relief of the pain from a series of diagnostic injections conducted in  separate occasions.  Pain Score: Pre-procedure: 8 /10 Post-procedure: 1 /10     Position / Prep / Materials:  Position: Prone  Prep solution: DuraPrep (Iodine Povacrylex [0.7% available iodine] and Isopropyl Alcohol, 74% w/w) Prep Area: Entire Lumbosacral Region (Lower back from mid-thoracic region to end of tailbone and from flank to flank.) Materials:  Tray: RFA (Radiofrequency) tray Needle(s):  Type: RFA (Teflon-coated radiofrequency ablation needles)  Pre-op H&P Assessment:  Tonya [name] is a 76 y.o. (year old), female patient, seen today for interventional treatment. She  has a past surgical history that includes Tubal ligation; Colonoscopy (12/15/2006); Colonoscopy with propofol (N/A, 06/06/2019); Colonoscopy with propofol (N/A, 01/16/2020); polypectomy (N/A, 01/16/2020); Cataract extraction w/PHACO (Left, 02/05/2021); Cataract extraction w/PHACO (Right, 03/19/2021); Eye surgery; Esophagogastroduodenoscopy (egd) with propofol (N/A, 06/23/2022); Xi robotic assisted paraesophageal hernia repair (N/A, 08/26/2022); and Insertion of mesh (08/26/2022). [name] has a current medication list which includes the following prescription(s): acetaminophen, aspirin, carvedilol, clopidogrel, docusate sodium, ibuprofen, methocarbamol, naloxone, novofine, olmesartan, omeprazole, rosuvastatin, torsemide, vitamin b-12, and vitamin d3. Her primarily concern today is the Back Pain (Right, lower)  Initial Vital Signs:  Pulse/HCG Rate: 88  Temp: (!) 97.1 F (36.2 C) Resp: 14 BP: (!) 154/93 SpO2: 100 %  BMI: Estimated body mass index is 21.77 kg/m as calculated from the following:   Height as of this encounter: '5\' 7"'$  (1.702 m).   Weight as of this encounter: 139 lb (63 kg).  Risk Assessment: Allergies: Reviewed. She is allergic to atorvastatin, gabapentin, pravastatin, pregabalin, and trulicity [dulaglutide].  Allergy Precautions: None required Coagulopathies: Reviewed. None identified.  Blood-thinner  therapy: None at this time Active Infection(s): Reviewed. None identified. Tonya [name] is afebrile  Site Confirmation: Tonya [name] was asked to confirm the procedure and laterality before marking the site  Procedure checklist: Completed Consent: Before the procedure and under the influence of no sedative(s), amnesic(s), or anxiolytics, the patient was informed of the treatment options, risks and possible complications. To fulfill our ethical and legal obligations, as recommended by the American Medical Association's Code of Ethics, I have informed the patient of my clinical impression; the nature and purpose of the treatment or procedure; the risks, benefits, and possible complications of the intervention; the alternatives, including doing nothing; the risk(s) and benefit(s) of the alternative treatment(s) or procedure(s); and the risk(s) and benefit(s) of doing nothing. The patient was provided information about the general risks and possible complications associated with the procedure. These may include, but are not limited to: failure to achieve desired goals, infection, bleeding, organ or nerve damage, allergic reactions, paralysis, and death. In addition, the patient was informed of those risks and complications associated to Spine-related procedures, such as failure to decrease pain; infection (i.e.: Meningitis, epidural or intraspinal abscess); bleeding (i.e.: epidural hematoma, subarachnoid hemorrhage, or any other type of intraspinal or peri-dural bleeding); organ or nerve damage (i.e.: Any type of peripheral nerve, nerve root, or spinal cord injury) with subsequent damage to sensory, motor, and/or autonomic systems, resulting in permanent pain, numbness, and/or weakness of one or several areas of the body; allergic reactions; (i.e.: anaphylactic reaction); and/or death. Furthermore, the patient was informed of those risks and complications associated with the medications. These include, but are not  limited to: allergic reactions (i.e.: anaphylactic or anaphylactoid reaction(s)); adrenal axis suppression; blood sugar elevation that in diabetics may result in ketoacidosis or comma; water retention that in patients with history of congestive heart failure may result in shortness of breath, pulmonary edema, and decompensation with resultant heart failure; weight gain; swelling or edema; medication-induced neural toxicity; particulate matter embolism and blood vessel occlusion with resultant organ, and/or nervous system infarction; and/or aseptic necrosis of one or more joints. Finally, the patient was informed that Medicine is not an exact science; therefore, there is also the possibility of unforeseen or unpredictable risks and/or possible complications that may result in a catastrophic outcome. The patient indicated having understood very clearly. We have given the patient no guarantees and we have made no promises. Enough time was given to the patient to ask questions, all of which were answered to the patient's satisfaction. Ms. Hallmon has indicated that she wanted to continue with the procedure. Attestation: I, the ordering provider, attest that I have discussed with the patient the benefits, risks, side-effects, alternatives, likelihood of achieving goals, and potential problems during recovery for the procedure that I have provided informed consent. Date  Time: 12/01/2022  8:01 AM  Pre-Procedure Preparation:  Monitoring: As per clinic protocol. Respiration, ETCO2, SpO2, BP, heart rate and rhythm monitor placed and checked for adequate function Safety Precautions: Patient was assessed for positional comfort and pressure points before starting the procedure. Time-out: I initiated and conducted the "Time-out" before starting the procedure, as per protocol. The patient was asked to participate by confirming the accuracy of the "Time Out" information. Verification of the correct person, site, and  procedure were performed and confirmed by me, the nursing staff, and the patient. "Time-out" conducted as per Joint Commission's Universal Protocol (UP.01.01.01). Time: 0835  Description of Procedure:          Laterality: Right Levels:  L3, L4, L5,  Medial Branch Level(s). Safety Precautions: Aspiration looking for blood return was conducted prior to all injections. At no point did we inject any substances, as a needle  was being advanced. Before injecting, the patient was told to immediately notify me if she was experiencing any new onset of "ringing in the ears, or metallic taste in the mouth". No attempts were made at seeking any paresthesias. Safe injection practices and needle disposal techniques used. Medications properly checked for expiration dates. SDV (single dose vial) medications used. After the completion of the procedure, all disposable equipment used was discarded in the proper designated medical waste containers. Local Anesthesia: Protocol guidelines were followed. The patient was positioned over the fluoroscopy table. The area was prepped in the usual manner. The time-out was completed. The target area was identified using fluoroscopy. A 12-in long, straight, sterile hemostat was used with fluoroscopic guidance to locate the targets for each level blocked. Once located, the skin was marked with an approved surgical skin marker. Once all sites were marked, the skin (epidermis, dermis, and hypodermis), as well as deeper tissues (fat, connective tissue and muscle) were infiltrated with a small amount of a short-acting local anesthetic, loaded on a 10cc syringe with a 25G, 1.5-in  Needle. An appropriate amount of time was allowed for local anesthetics to take effect before proceeding to the next step. Technical description of process:  Radiofrequency Ablation (RFA) L3 Medial Branch Nerve RFA: The target area for the L3 medial branch is at the junction of the postero-lateral aspect of the  superior articular process and the superior, posterior, and medial edge of the transverse process of L4. Under fluoroscopic guidance, a Radiofrequency needle was inserted until contact was made with os over the superior postero-lateral aspect of the pedicular shadow (target area). Sensory and motor testing was conducted to properly adjust the position of the needle. Once satisfactory placement of the needle was achieved, the numbing solution was slowly injected after negative aspiration for blood. 2.0 mL of the nerve block solution was injected without difficulty or complication. After waiting for at least 3 minutes, the ablation was performed. Once completed, the needle was removed intact. L4 Medial Branch Nerve RFA: The target area for the L4 medial branch is at the junction of the postero-lateral aspect of the superior articular process and the superior, posterior, and medial edge of the transverse process of L5. Under fluoroscopic guidance, a Radiofrequency needle was inserted until contact was made with os over the superior postero-lateral aspect of the pedicular shadow (target area). Sensory and motor testing was conducted to properly adjust the position of the needle. Once satisfactory placement of the needle was achieved, the numbing solution was slowly injected after negative aspiration for blood. 2.0 mL of the nerve block solution was injected without difficulty or complication. After waiting for at least 3 minutes, the ablation was performed. Once completed, the needle was removed intact. L5 Medial Branch Nerve RFA: The target area for the L5 medial branch is at the junction of the postero-lateral aspect of the superior articular process of S1 and the superior, posterior, and medial edge of the sacral ala. Under fluoroscopic guidance, a Radiofrequency needle was inserted until contact was made with os over the superior postero-lateral aspect of the pedicular shadow (target area). Sensory and motor  testing was conducted to properly adjust the position of the needle. Once satisfactory placement of the needle was achieved, the numbing solution was slowly injected after negative aspiration for blood. 2.0 mL of the nerve block solution was injected without difficulty or complication. After waiting for at least 3 minutes, the ablation was performed. Once completed, the needle was removed intact.  Radiofrequency  lesioning (ablation):  Radiofrequency Generator: Medtronic AccurianTM AG 1000 RF Generator Sensory Stimulation Parameters: 50 Hz was used to locate & identify the nerve, making sure that the needle was positioned such that there was no sensory stimulation below 0.3 V or above 0.7 V. Motor Stimulation Parameters: 2 Hz was used to evaluate the motor component. Care was taken not to lesion any nerves that demonstrated motor stimulation of the lower extremities at an output of less than 2.5 times that of the sensory threshold, or a maximum of 2.0 V. Lesioning Technique Parameters: Standard Radiofrequency settings. (Not bipolar or pulsed.) Temperature Settings: 80 degrees C Lesioning time: 60 seconds Intra-operative Compliance: Compliant   6 cc solution made of 5 cc of 0.2% ropivacaine, 1 cc of Decadron 10 mg/cc.  2 cc injected at each level on the right after sensorimotor testing, prior to lesioning   Once the entire procedure was completed, the treated area was cleaned, making sure to leave some of the prepping solution back to take advantage of its long term bactericidal properties.    Illustration of the posterior view of the lumbar spine and the posterior neural structures. Laminae of L2 through S1 are labeled. DPRL5, dorsal primary ramus of L5; DPRS1, dorsal primary ramus of S1; DPR3, dorsal primary ramus of L3; FJ, facet (zygapophyseal) joint L3-L4; I, inferior articular process of L4; LB1, lateral branch of dorsal primary ramus of L1; IAB, inferior articular branches from L3 medial branch  (supplies L4-L5 facet joint); IBP, intermediate branch plexus; MB3, medial branch of dorsal primary ramus of L3; NR3, third lumbar nerve root; S, superior articular process of L5; SAB, superior articular branches from L4 (supplies L4-5 facet joint also); TP3, transverse process of L3.  Vitals:   12/01/22 0848 12/01/22 0853 12/01/22 0858 12/01/22 0906  BP: (!) 149/85 (!) 161/84 (!) 163/84   Pulse: 81 84 79 (!) 54  Resp: '15 16 16 16  '$ Temp:      TempSrc:      SpO2: 99% 98% 99%   Weight:      Height:        Start Time: 0835 hrs. End Time: 0858 hrs.  Imaging Guidance (Spinal):          Type of Imaging Technique: Fluoroscopy Guidance (Spinal) Indication(s): Assistance in needle guidance and placement for procedures requiring needle placement in or near specific anatomical locations not easily accessible without such assistance. Exposure Time: Please see nurses notes. Contrast: None used. Fluoroscopic Guidance: I was personally present during the use of fluoroscopy. "Tunnel Vision Technique" used to obtain the best possible view of the target area. Parallax error corrected before commencing the procedure. "Direction-depth-direction" technique used to introduce the needle under continuous pulsed fluoroscopy. Once target was reached, antero-posterior, oblique, and lateral fluoroscopic projection used confirm needle placement in all planes. Images permanently stored in EMR. Interpretation: No contrast injected. I personally interpreted the imaging intraoperatively. Adequate needle placement confirmed in multiple planes. Permanent images saved into the patient's record.  Antibiotic Prophylaxis:   Anti-infectives (From admission, onward)    None      Indication(s): None identified  Post-operative Assessment:  Post-procedure Vital Signs:  Pulse/HCG Rate: (!) 54  Temp: (!) 97.1 F (36.2 C) Resp: 16 BP: (!) 163/84 SpO2: 99 %  EBL: None  Complications: No immediate post-treatment  complications observed by team, or reported by patient.  Note: The patient tolerated the entire procedure well. A repeat set of vitals were taken after the procedure and the patient was  kept under observation following institutional policy, for this type of procedure. Post-procedural neurological assessment was performed, showing return to baseline, prior to discharge. The patient was provided with post-procedure discharge instructions, including a section on how to identify potential problems. Should any problems arise concerning this procedure, the patient was given instructions to immediately contact us, at any time, without hesitation. In any case, we plan to contact the patient by telephone for a follow-up status report regarding this interventional procedure.  Comments:  No additional relevant information.  Plan of Care  Orders:  Orders Placed This Encounter  Procedures   DG PAIN [facility] C-ARM 1-60 MIN NO REPORT    Intraoperative interpretation by procedural physician at [place].    Standing Status:   Standing    Number of Occurrences:   1    Order Specific Question:   Reason for exam:    Answer:   Assistance in needle guidance and placement for procedures requiring needle placement in or near specific anatomical locations not easily accessible without such assistance.     Medications ordered for procedure: Meds ordered this encounter  Medications   lidocaine (XYLOCAINE) 2 % (with pres) injection 400 mg   lactated ringers infusion   midazolam (VERSED) 5 MG/5ML injection 0.5-2 mg    Make sure Flumazenil is available in the pyxis when using this medication. If oversedation occurs, administer 0.2 mg IV over 15 sec. If after 45 sec no response, administer 0.2 mg again over 1 min; may repeat at 1 min intervals; not to exceed 4 doses (1 mg)   dexamethasone (DECADRON) injection 10 mg   ropivacaine (PF) 2 mg/mL (0.2%) (NAROPIN) injection 9 mL   Medications administered: We  administered lidocaine, lactated ringers, midazolam, dexamethasone, and ropivacaine (PF) 2 mg/mL (0.2%).  See the medical record for exact dosing, route, and time of administration.  Follow-up plan:   Return in about 4 weeks (around 12/29/2022) for Post Procedure Evaluation, in person.       Postherpetic neuralgia: Failed gabapentin, Lyrica, Cymbalta, tramadol.     Recent Visits Date Type Provider Dept  10/23/22 Office Visit Tonya Santa, MD [facility]  Showing recent visits within past 90 days and meeting all other requirements Today's Visits Date Type Provider Dept  12/01/22 Procedure visit Tonya Santa, MD [facility]  Showing today's visits and meeting all other requirements Future Appointments Date Type Provider Dept  12/29/22 Appointment Tonya Santa, MD [facility]  Showing future appointments within next 90 days and meeting all other requirements  Disposition: Discharge home  Discharge (Date  Time): 12/01/2022; 0911 hrs.   Primary Care Physician: [name], NP Location: [facility] Note by: Tonya Santa, MD Date: 12/01/2022; Time: 9:30 AM  Disclaimer:  Medicine is not an exact science. The only guarantee in medicine is that nothing is guaranteed. It is important to note that the decision to proceed with this intervention was based on the information collected from the patient. The Data and conclusions were drawn from the patient's questionnaire, the interview, and the physical examination. Because the information was provided in large part by the patient, it cannot be guaranteed that it has not been purposely or unconsciously manipulated. Every effort has been made to obtain as much relevant data as possible for this evaluation. It is important to note that the conclusions that lead to this procedure are derived in large part from the available data. Always take into account that the treatment will  also be  dependent on availability of resources and existing treatment guidelines, considered by other Pain Management Practitioners as being common knowledge and practice, at the time of the intervention. For Medico-Legal purposes, it is also important to point out that variation in procedural techniques and pharmacological choices are the acceptable norm. The indications, contraindications, technique, and results of the above procedure should only be interpreted and judged by a Board-Certified Interventional Pain Specialist with extensive familiarity and expertise in the same exact procedure and technique.

## 2022-12-01 NOTE — Patient Instructions (Addendum)
Post-procedure Information What to expect: Most procedures involve the use of a local anesthetic (numbing medicine), and a steroid (anti-inflammatory medicine).  The local anesthetics may cause temporary numbness and weakness of the legs or arms, depending on the location of the block. This numbness/weakness may last 4-6 hours, depending on the local anesthetic used. In rare instances, it can last up to 24 hours. While numb, you must be very careful not to injure the extremity.  After any procedure, you could expect the pain to get better within 15-20 minutes. This relief is temporary and may last 4-6 hours. Once the local anesthetics wears off, you could experience discomfort, possibly more than usual, for up to 10 (ten) days. In the case of radiofrequencies, it may last up to 6 weeks. Surgeries may take up to 8 weeks for the healing process. The discomfort is due to the irritation caused by needles going through skin and muscle. To minimize the discomfort, we recommend using ice the first day, and heat from then on. The ice should be applied for 15 minutes on, and 15 minutes off. Keep repeating this cycle until bedtime. Avoid applying the ice directly to the skin, to prevent frostbite. Heat should be used daily, until the pain improves (4-10 days). Be careful not to burn yourself.  Occasionally you may experience muscle spasms or cramps. These occur as a consequence of the irritation caused by the needle sticks to the muscle and the blood that will inevitably be lost into the surrounding muscle tissue. Blood tends to be very irritating to tissues, which tend to react by going into spasm. These spasms may start the same day of your procedure, but they may also take days to develop. This late onset type of spasm or cramp is usually caused by electrolyte imbalances triggered by the steroids, at the level of the kidney. Cramps and spasms tend to respond well to muscle relaxants, multivitamins (some are  triggered by the procedure, but may have their origins in vitamin deficiencies), and "Gatorade", or any sports drinks that can replenish any electrolyte imbalances. (If you are a diabetic, ask your pharmacist to get you a sugar-free brand.) Warm showers or baths may also be helpful. Stretching exercises are highly recommended. General Instructions:  Be alert for signs of possible infection: redness, swelling, heat, red streaks, elevated temperature, and/or fever. These typically appear 4 to 6 days after the procedure. Immediately notify your doctor if you experience unusual bleeding, difficulty breathing, or loss of bowel or bladder control. If you experience increased pain, do not increase your pain medicine intake, unless instructed by your pain physician. Post-Procedure Care:  Be careful in moving about. Muscle spasms in the area of the injection may occur. Applying ice or heat to the area is often helpful. The incidence of spinal headaches after epidural injections ranges between 1.4% and 6%. If you develop a headache that does not seem to respond to conservative therapy, please let your physician know. This can be treated with an epidural blood patch.   Post-procedure numbness or redness is to be expected, however it should average 4 to 6 hours. If numbness and weakness of your extremities begins to develop 4 to 6 hours after your procedure, and is felt to be progressing and worsening, immediately contact your physician.   Diet:  If you experience nausea, do not eat until this sensation goes away. If you had a "Stellate Ganglion Block" for upper extremity "Reflex Sympathetic Dystrophy", do not eat or drink until your   hoarseness goes away. In any case, always start with liquids first and if you tolerate them well, then slowly progress to more solid foods. Activity:  For the first 4 to 6 hours after the procedure, use caution in moving about as you may experience numbness and/or weakness. Use caution in  cooking, using household electrical appliances, and climbing steps. If you need to reach your Doctor call our office: [phone] Monday-Thursday 8:00 am - 4:00 PM    Fridays: Closed     In case of an emergency: In case of emergency, call 911 or go to the nearest emergency room and have the physician there call us.  Interpretation of Procedure Every nerve block has two components: a diagnostic component, and a treatment component. Unrealistic expectations are the most common causes of "perceived failure".  In a perfect world, a single nerve block should be able to completely and permanently eliminate the pain. Sadly, the world is not perfect.  Most pain management nerve blocks are performed using local anesthetics and steroids. Steroids are responsible for any long-term benefit that you may experience. Their purpose is to decrease any chronic swelling that may exist in the area. Steroids begin to work immediately after being injected. However, most patients will not experience any benefits until 5 to 10 days after the injection, when the swelling has come down to the point where they can tell a difference. Steroids will only help if there is swelling to be treated. As such, they can assist with the diagnosis. If effective, they suggest an inflammatory component to the pain, and if ineffective, they rule out inflammation as the main cause or component of the problem. If the problem is one of mechanical compression, you will get no benefit from those steroids.   In the case of local anesthetics, they have a crucial role in the diagnosis of your condition. Most will begin to work within15 to 20 minutes after injection. The duration will depend on the type used (short- vs. Long-acting). It is of outmost importance that patients keep tract of their pain, after the procedure. To assist with this matter, a "Post-procedure Pain Diary" is provided. Make sure to complete it and to bring it back to your  follow-up appointment.  As long as the patient keeps accurate, detailed records of their symptoms after every procedure, and returns to have those interpreted, every procedure will provide us with invaluable information. Even a block that does not provide the patient with any relief, will always provide us with information about the mechanism and the origin of the pain. The only time a nerve block can be considered a waste of time is when patients do not keep track of the results, or do not keep their post-procedure appointment.  Reporting the results back to your physician The Pain Score  Pain is a subjective complaint. It cannot be seen, touched, or measured. We depend entirely on the patient's report of the pain in order to assess your condition and treatment. To evaluate the pain, we use a pain scale, where "0" means "No Pain", and a "10" is "the worst possible pain that you can even imagine" (i.e. something like been eaten alive by a shark or being torn apart by a lion).   You will frequently be asked to rate your pain. Please be as accurate, remember that medical decisions will be based on your responses. Please do not rate your pain above a 10. Doing so is actually interpreted as "symptom magnification" (exaggeration), as   well as lack of understanding with regards to the scale. To put this into perspective, when you tell us that your pain is at a 10 (ten), what you are saying is that there is nothing we can do to make this pain any worse. (Carefully think about that.)  ___________________________________________________________________________________________  Post-Radiofrequency (RF) Discharge Instructions  You have just completed a Radiofrequency Neurotomy.  The following instructions will provide you with information and guidelines for self-care upon discharge.  If at any time you have questions or concerns please call your physician. DO NOT DRIVE YOURSELF!!  Instructions: Apply ice: Fill  a plastic sandwich bag with crushed ice. Cover it with a small towel and apply to injection site. Apply for 15 minutes then remove x 15 minutes. Repeat sequence on day of procedure, until you go to bed. The purpose is to minimize swelling and discomfort after procedure. Apply heat: Apply heat to procedure site starting the day following the procedure. The purpose is to treat any soreness and discomfort from the procedure. Food intake: No eating limitations, unless stipulated above.  Nevertheless, if you have had sedation, you may experience some nausea.  In this case, it may be wise to wait at least two hours prior to resuming regular diet. Physical activities: Keep activities to a minimum for the first 8 hours after the procedure. For the first 24 hours after the procedure, do not drive a motor vehicle,  Operate heavy machinery, power tools, or handle any weapons.  Consider walking with the use of an assistive device or accompanied by an adult for the first 24 hours.  Do not drink alcoholic beverages including beer.  Do not make any important decisions or sign any legal documents. Go home and rest today.  Resume activities tomorrow, as tolerated.  Use caution in moving about as you may experience mild leg weakness.  Use caution in cooking, use of household electrical appliances and climbing steps. Driving: If you have received any sedation, you are not allowed to drive for 24 hours after your procedure. Blood thinner: Restart your blood thinner 6 hours after your procedure. (Only for those taking blood thinners) Insulin: As soon as you can eat, you may resume your normal dosing schedule. (Only for those taking insulin) Medications: May resume pre-procedure medications.  Do not take any drugs, other than what has been prescribed to you. Infection prevention: Keep procedure site clean and dry. Post-procedure Pain Diary: Extremely important that this be done correctly and accurately. Recorded information will  be used to determine the next step in treatment. Pain evaluated is that of treated area only. Do not include pain from an untreated area. Complete every hour, on the hour, for the initial 8 hours. Set an alarm to help you do this part accurately. Do not go to sleep and have it completed later. It will not be accurate. Follow-up appointment: Keep your follow-up appointment after the procedure. Usually 2-6 weeks after radiofrequency. Bring you pain diary. The information collected will be essential for your long-term care.   Expect: From numbing medicine (AKA: Local Anesthetics): Numbness or decrease in pain. Onset: Full effect within 15 minutes of injected. Duration: It will depend on the type of local anesthetic used. On the average, 1 to 8 hours.  From steroids (when added): Decrease in swelling or inflammation. Once inflammation is improved, relief of the pain will follow. Onset of benefits: Depends on the amount of swelling present. The more swelling, the longer it will take for the benefits to  be seen. In some cases, up to 10 days. Duration: Steroids will stay in the system x 2 weeks. Duration of benefits will depend on multiple posibilities including persistent irritating factors. From procedure: Some discomfort is to be expected once the numbing medicine wears off. In the case of radiofrequency procedures, this may last as long as 6 weeks. Additional post-procedure pain medication is provided for this. Discomfort is minimized if ice and heat are applied as instructed.  Call if: You experience numbness and weakness that gets worse with time, as opposed to wearing off. He experience any unusual bleeding, difficulty breathing, or loss of the ability to control your bowel and bladder. (This applies to Spinal procedures only) You experience any redness, swelling, heat, red streaks, elevated temperature, fever, or any other signs of a possible infection.  Emergency Numbers: [place] business  hours (Monday - Thursday, 8:00 AM - 4:00 PM) (Friday, 9:00 AM - 12:00 Noon): [phone] After hours: [phone] ____________________________________________________________________________________________

## 2022-12-02 ENCOUNTER — Telehealth: Payer: Self-pay

## 2022-12-02 NOTE — Telephone Encounter (Signed)
Post procedure phone call.  Lm

## 2022-12-04 DIAGNOSIS — D649 Anemia, unspecified: Secondary | ICD-10-CM | POA: Diagnosis not present

## 2022-12-04 DIAGNOSIS — Z8711 Personal history of peptic ulcer disease: Secondary | ICD-10-CM | POA: Diagnosis not present

## 2022-12-04 DIAGNOSIS — Z20822 Contact with and (suspected) exposure to covid-19: Secondary | ICD-10-CM | POA: Diagnosis not present

## 2022-12-04 DIAGNOSIS — Z888 Allergy status to other drugs, medicaments and biological substances status: Secondary | ICD-10-CM | POA: Diagnosis not present

## 2022-12-04 DIAGNOSIS — N184 Chronic kidney disease, stage 4 (severe): Secondary | ICD-10-CM | POA: Diagnosis not present

## 2022-12-04 DIAGNOSIS — K259 Gastric ulcer, unspecified as acute or chronic, without hemorrhage or perforation: Secondary | ICD-10-CM | POA: Diagnosis not present

## 2022-12-04 DIAGNOSIS — Z79899 Other long term (current) drug therapy: Secondary | ICD-10-CM | POA: Diagnosis not present

## 2022-12-04 DIAGNOSIS — R9431 Abnormal electrocardiogram [ECG] [EKG]: Secondary | ICD-10-CM | POA: Diagnosis not present

## 2022-12-04 DIAGNOSIS — K449 Diaphragmatic hernia without obstruction or gangrene: Secondary | ICD-10-CM | POA: Diagnosis not present

## 2022-12-04 DIAGNOSIS — K922 Gastrointestinal hemorrhage, unspecified: Secondary | ICD-10-CM | POA: Diagnosis not present

## 2022-12-04 DIAGNOSIS — E1169 Type 2 diabetes mellitus with other specified complication: Secondary | ICD-10-CM | POA: Diagnosis not present

## 2022-12-04 DIAGNOSIS — K920 Hematemesis: Secondary | ICD-10-CM | POA: Diagnosis not present

## 2022-12-04 DIAGNOSIS — Z794 Long term (current) use of insulin: Secondary | ICD-10-CM | POA: Diagnosis not present

## 2022-12-04 DIAGNOSIS — K9189 Other postprocedural complications and disorders of digestive system: Secondary | ICD-10-CM | POA: Diagnosis not present

## 2022-12-04 DIAGNOSIS — R4782 Fluency disorder in conditions classified elsewhere: Secondary | ICD-10-CM | POA: Diagnosis not present

## 2022-12-04 DIAGNOSIS — K644 Residual hemorrhoidal skin tags: Secondary | ICD-10-CM | POA: Diagnosis not present

## 2022-12-04 DIAGNOSIS — E1122 Type 2 diabetes mellitus with diabetic chronic kidney disease: Secondary | ICD-10-CM | POA: Diagnosis not present

## 2022-12-04 DIAGNOSIS — E1140 Type 2 diabetes mellitus with diabetic neuropathy, unspecified: Secondary | ICD-10-CM | POA: Diagnosis not present

## 2022-12-04 DIAGNOSIS — Z8673 Personal history of transient ischemic attack (TIA), and cerebral infarction without residual deficits: Secondary | ICD-10-CM | POA: Diagnosis not present

## 2022-12-04 DIAGNOSIS — N189 Chronic kidney disease, unspecified: Secondary | ICD-10-CM | POA: Diagnosis not present

## 2022-12-04 DIAGNOSIS — K254 Chronic or unspecified gastric ulcer with hemorrhage: Secondary | ICD-10-CM | POA: Diagnosis not present

## 2022-12-04 DIAGNOSIS — Z7982 Long term (current) use of aspirin: Secondary | ICD-10-CM | POA: Diagnosis not present

## 2022-12-04 DIAGNOSIS — I5032 Chronic diastolic (congestive) heart failure: Secondary | ICD-10-CM | POA: Diagnosis not present

## 2022-12-04 DIAGNOSIS — E785 Hyperlipidemia, unspecified: Secondary | ICD-10-CM | POA: Diagnosis not present

## 2022-12-04 DIAGNOSIS — K439 Ventral hernia without obstruction or gangrene: Secondary | ICD-10-CM | POA: Diagnosis not present

## 2022-12-04 DIAGNOSIS — R059 Cough, unspecified: Secondary | ICD-10-CM | POA: Diagnosis not present

## 2022-12-04 DIAGNOSIS — K219 Gastro-esophageal reflux disease without esophagitis: Secondary | ICD-10-CM | POA: Diagnosis not present

## 2022-12-04 DIAGNOSIS — J9811 Atelectasis: Secondary | ICD-10-CM | POA: Diagnosis not present

## 2022-12-04 DIAGNOSIS — F985 Adult onset fluency disorder: Secondary | ICD-10-CM | POA: Diagnosis not present

## 2022-12-04 DIAGNOSIS — I130 Hypertensive heart and chronic kidney disease with heart failure and stage 1 through stage 4 chronic kidney disease, or unspecified chronic kidney disease: Secondary | ICD-10-CM | POA: Diagnosis not present

## 2022-12-04 DIAGNOSIS — Z9889 Other specified postprocedural states: Secondary | ICD-10-CM | POA: Diagnosis not present

## 2022-12-05 DIAGNOSIS — R059 Cough, unspecified: Secondary | ICD-10-CM | POA: Diagnosis not present

## 2022-12-05 DIAGNOSIS — K922 Gastrointestinal hemorrhage, unspecified: Secondary | ICD-10-CM | POA: Diagnosis not present

## 2022-12-05 DIAGNOSIS — Z8719 Personal history of other diseases of the digestive system: Secondary | ICD-10-CM | POA: Insufficient documentation

## 2022-12-05 DIAGNOSIS — E1169 Type 2 diabetes mellitus with other specified complication: Secondary | ICD-10-CM | POA: Diagnosis not present

## 2022-12-05 DIAGNOSIS — K920 Hematemesis: Secondary | ICD-10-CM | POA: Diagnosis not present

## 2022-12-05 DIAGNOSIS — Z8673 Personal history of transient ischemic attack (TIA), and cerebral infarction without residual deficits: Secondary | ICD-10-CM | POA: Diagnosis not present

## 2022-12-05 DIAGNOSIS — Z8711 Personal history of peptic ulcer disease: Secondary | ICD-10-CM | POA: Diagnosis not present

## 2022-12-05 DIAGNOSIS — J9811 Atelectasis: Secondary | ICD-10-CM | POA: Diagnosis not present

## 2022-12-05 DIAGNOSIS — R9431 Abnormal electrocardiogram [ECG] [EKG]: Secondary | ICD-10-CM | POA: Diagnosis not present

## 2022-12-05 DIAGNOSIS — N189 Chronic kidney disease, unspecified: Secondary | ICD-10-CM | POA: Diagnosis not present

## 2022-12-06 DIAGNOSIS — K922 Gastrointestinal hemorrhage, unspecified: Secondary | ICD-10-CM | POA: Diagnosis not present

## 2022-12-07 DIAGNOSIS — K922 Gastrointestinal hemorrhage, unspecified: Secondary | ICD-10-CM | POA: Diagnosis not present

## 2022-12-07 DIAGNOSIS — N189 Chronic kidney disease, unspecified: Secondary | ICD-10-CM | POA: Diagnosis not present

## 2022-12-07 DIAGNOSIS — Z9889 Other specified postprocedural states: Secondary | ICD-10-CM | POA: Diagnosis not present

## 2022-12-07 DIAGNOSIS — K219 Gastro-esophageal reflux disease without esophagitis: Secondary | ICD-10-CM | POA: Diagnosis not present

## 2022-12-07 DIAGNOSIS — K920 Hematemesis: Secondary | ICD-10-CM | POA: Diagnosis not present

## 2022-12-07 DIAGNOSIS — K449 Diaphragmatic hernia without obstruction or gangrene: Secondary | ICD-10-CM | POA: Diagnosis not present

## 2022-12-07 DIAGNOSIS — E1169 Type 2 diabetes mellitus with other specified complication: Secondary | ICD-10-CM | POA: Diagnosis not present

## 2022-12-07 DIAGNOSIS — R4782 Fluency disorder in conditions classified elsewhere: Secondary | ICD-10-CM | POA: Diagnosis not present

## 2022-12-08 DIAGNOSIS — K922 Gastrointestinal hemorrhage, unspecified: Secondary | ICD-10-CM | POA: Diagnosis not present

## 2022-12-08 DIAGNOSIS — R4782 Fluency disorder in conditions classified elsewhere: Secondary | ICD-10-CM | POA: Diagnosis not present

## 2022-12-08 DIAGNOSIS — N189 Chronic kidney disease, unspecified: Secondary | ICD-10-CM | POA: Diagnosis not present

## 2022-12-08 DIAGNOSIS — E1169 Type 2 diabetes mellitus with other specified complication: Secondary | ICD-10-CM | POA: Diagnosis not present

## 2022-12-09 DIAGNOSIS — K254 Chronic or unspecified gastric ulcer with hemorrhage: Secondary | ICD-10-CM | POA: Diagnosis not present

## 2022-12-09 DIAGNOSIS — K922 Gastrointestinal hemorrhage, unspecified: Secondary | ICD-10-CM | POA: Diagnosis not present

## 2022-12-09 DIAGNOSIS — K9189 Other postprocedural complications and disorders of digestive system: Secondary | ICD-10-CM | POA: Diagnosis not present

## 2022-12-09 DIAGNOSIS — K219 Gastro-esophageal reflux disease without esophagitis: Secondary | ICD-10-CM | POA: Diagnosis not present

## 2022-12-09 DIAGNOSIS — N189 Chronic kidney disease, unspecified: Secondary | ICD-10-CM | POA: Diagnosis not present

## 2022-12-09 DIAGNOSIS — K259 Gastric ulcer, unspecified as acute or chronic, without hemorrhage or perforation: Secondary | ICD-10-CM | POA: Diagnosis not present

## 2022-12-09 DIAGNOSIS — R4782 Fluency disorder in conditions classified elsewhere: Secondary | ICD-10-CM | POA: Diagnosis not present

## 2022-12-09 DIAGNOSIS — E1169 Type 2 diabetes mellitus with other specified complication: Secondary | ICD-10-CM | POA: Diagnosis not present

## 2022-12-09 DIAGNOSIS — K449 Diaphragmatic hernia without obstruction or gangrene: Secondary | ICD-10-CM | POA: Diagnosis not present

## 2022-12-09 DIAGNOSIS — K920 Hematemesis: Secondary | ICD-10-CM | POA: Diagnosis not present

## 2022-12-10 ENCOUNTER — Telehealth: Payer: Self-pay | Admitting: *Deleted

## 2022-12-10 NOTE — Patient Outreach (Signed)
  Care Coordination [facility] Note Transition Care Management Follow-up Telephone Call Date of discharge and from where: [facility] [phone] How have you been since you were released from the hospital? I am doing good today Any questions or concerns? No  Items Reviewed: Did the pt receive and understand the discharge instructions provided? Yes  Medications obtained and verified? Yes  Other? No  Any new allergies since your discharge? No  Dietary orders reviewed? No Do you have support at home? Yes   Home Care and Equipment/Supplies: Were home health services ordered? no If so, what is the name of the agency? N/a  Has the agency set up a time to come to the patient's home? not applicable Were any new equipment or medical supplies ordered?  No What is the name of the medical supply agency? N/a Were you able to get the supplies/equipment? not applicable Do you have any questions related to the use of the equipment or supplies? No  Functional Questionnaire: (I = Independent and D = Dependent) ADLs: D uses a walker  Bathing/Dressing- I  Meal Prep- I  Eating- I  Maintaining continence- I  Transferring/Ambulation- I  Managing Meds- I  Follow up appointments reviewed:  PCP Hospital f/u appt confirmed? No  . [facility] f/u appt confirmed? Yes   [id number] lab 10:00 [id number] 2:20 Pain management [id number] 1:15 Dr [name] oncology and then infusion  [id number] 11 Dr [facility] surgeon  Are transportation arrangements needed? No  If their condition worsens, is the pt aware to call PCP or go to the Emergency Dept.? Yes Was the patient provided with contact information for the PCP's office or ED? Yes Was to pt encouraged to call back with questions or concerns? Yes  SDOH assessments and interventions completed:   Yes SDOH Interventions Today    Flowsheet Row Most Recent Value  SDOH Interventions   Food Insecurity Interventions Intervention Not Indicated  Housing Interventions Intervention  Not Indicated  Transportation Interventions Intervention Not Indicated       Care Coordination Interventions:  PCP follow up appointment requested RN explained to patient how to initiate [facility] meal plan    Encounter Outcome:  Pt. Visit Completed    [place] [facility] [phone]

## 2022-12-11 ENCOUNTER — Telehealth: Payer: Self-pay | Admitting: *Deleted

## 2022-12-11 NOTE — Progress Notes (Signed)
  Care Coordination  Note  12/11/2022 Name: Tonya [name] MRN: [id number] DOB: 02-Apr-1946  Tonya [name] is a 76 y.o. year old primary care patient of Cannady, [name], NP. I reached out to [name] by phone today to assist with scheduling a follow up appointment. [name] verbally consented to my assistance.       Follow up plan: Unsuccessful telephone outreach attempt made. A HIPAA compliant phone message was left for the patient providing contact information and requesting a return call.   Julian Hy, [place] Direct Dial: [phone]

## 2022-12-12 NOTE — Progress Notes (Signed)
  Care Coordination  Note  12/12/2022 Name: Tonya [name] MRN: [id number] DOB: [birth year]  Tonya [name] is a 76 y.o. year old primary care patient of Cannady, [name], NP. I reached out to [name] by phone today to assist with scheduling a follow up appointment. [name] verbally consented to my assistance.       Follow up plan: Hospital Follow Up appointment scheduled with [name], NP) on (12/22/2022) at (3pm).  Julian Hy, [place] Direct Dial: [phone]

## 2022-12-14 DIAGNOSIS — I5030 Unspecified diastolic (congestive) heart failure: Secondary | ICD-10-CM

## 2022-12-14 DIAGNOSIS — E1159 Type 2 diabetes mellitus with other circulatory complications: Secondary | ICD-10-CM | POA: Diagnosis not present

## 2022-12-14 DIAGNOSIS — I110 Hypertensive heart disease with heart failure: Secondary | ICD-10-CM

## 2022-12-20 NOTE — Patient Instructions (Signed)
Gastrointestinal Bleeding Gastrointestinal (GI) bleeding is bleeding anywhere along the digestive tract. The digestive tract carries food from your mouth until it leaves your body as waste, or poop. You may have blood in your poop or have black poop. If you vomit, there may be blood in it too. This condition can be mild, serious, or even life-threatening. If you have a lot of bleeding, you may need to stay in the hospital. What are the causes? This condition may be caused by: Irritation and swelling of the esophagus (esophagitis). The esophagus is part of the body that moves food from your mouth to your stomach. Swollen veins in the butt (hemorrhoids). Tears in the opening of the butt. These are often caused by passing hard poop. Pouches that form on the colon (diverticulosis). Irritation and swelling of pouches that have formed in your colon (diverticulitis). Growths (polyps) or cancer. Irritation of the stomach lining (gastritis). Sores (ulcers) in the stomach. What increases the risk? You are more likely to develop this condition if: You have a certain type of infection in your stomach called Helicobacter pylori infection. You take certain medicines. You smoke. You drink alcohol. What are the signs or symptoms? Common symptoms of this condition include: Vomiting material that has bright red blood in it. It may look like coffee grounds. Changes in your poop. The poop: May have red blood in it. May be black, look like tar, and smell stronger than normal. May be red. Pain or cramping in the belly (abdomen). How is this treated? Treatment for this condition depends on the cause of the bleeding. For example: Your doctor may treat the bleeding during diagnosis. Common ways of diagnosing this condition is endoscopy or colonoscopy. Medicines can be used to: Help control irritation, swelling, or infection. Reduce acid in your stomach. Certain problems can be treated  with: Creams. Medicines that are put in the butt (suppositories). Warm baths. Surgery is sometimes needed. If you lose a lot of blood, you may need a blood transfusion. If there is a lot of bleeding, you will need to stay in the hospital. If bleeding is mild, you may be allowed to go home. Follow these instructions at home:  Take over-the-counter and prescription medicines only as told by your doctor. Eat foods that have a lot of fiber in them. These foods include beans, whole grains, and fresh fruits and vegetables. You can also try eating 1-3 prunes each day. Drink enough fluid to keep your pee (urine) pale yellow. Keep all follow-up visits. Contact a doctor if: Your symptoms do not get better with treatment. Get help right away if: Your bleeding does not stop. You feel dizzy or you pass out (faint). You feel weak. You have very bad cramps in your back or belly. You pass large clumps of blood (clots) in your poop. Your symptoms are getting worse. You have chest pain or fast heartbeats. These symptoms may be an emergency. Get help right away. Call 911. Do not wait to see if the symptoms will go away. Do not drive yourself to the hospital. Summary Gastrointestinal (GI) bleeding is bleeding anywhere along the digestive tract. The digestive tract carries food from your mouth until it leaves your body as waste, or poop. This bleeding can be caused by many things. Treatment depends on the cause of the bleeding. Take medicines only as told by your doctor. Keep all follow-up visits. This information is not intended to replace advice given to you by your health care provider. Make sure  you discuss any questions you have with your health care provider. Document Revised: 07/05/2021 Document Reviewed: 07/05/2021 Elsevier Patient Education  [place].

## 2022-12-22 ENCOUNTER — Encounter: Payer: Self-pay | Admitting: Nurse Practitioner

## 2022-12-22 ENCOUNTER — Ambulatory Visit (INDEPENDENT_AMBULATORY_CARE_PROVIDER_SITE_OTHER): Payer: Medicare Other | Admitting: Nurse Practitioner

## 2022-12-22 VITALS — BP 120/68 | HR 87 | Temp 97.9°F | Ht 67.01 in | Wt 147.4 lb

## 2022-12-22 DIAGNOSIS — N184 Chronic kidney disease, stage 4 (severe): Secondary | ICD-10-CM

## 2022-12-22 DIAGNOSIS — E1169 Type 2 diabetes mellitus with other specified complication: Secondary | ICD-10-CM | POA: Diagnosis not present

## 2022-12-22 DIAGNOSIS — E1165 Type 2 diabetes mellitus with hyperglycemia: Secondary | ICD-10-CM | POA: Diagnosis not present

## 2022-12-22 DIAGNOSIS — Z23 Encounter for immunization: Secondary | ICD-10-CM | POA: Diagnosis not present

## 2022-12-22 DIAGNOSIS — K922 Gastrointestinal hemorrhage, unspecified: Secondary | ICD-10-CM | POA: Diagnosis not present

## 2022-12-22 DIAGNOSIS — E1140 Type 2 diabetes mellitus with diabetic neuropathy, unspecified: Secondary | ICD-10-CM

## 2022-12-22 DIAGNOSIS — E785 Hyperlipidemia, unspecified: Secondary | ICD-10-CM | POA: Diagnosis not present

## 2022-12-22 DIAGNOSIS — N2581 Secondary hyperparathyroidism of renal origin: Secondary | ICD-10-CM | POA: Diagnosis not present

## 2022-12-22 DIAGNOSIS — Z794 Long term (current) use of insulin: Secondary | ICD-10-CM

## 2022-12-22 DIAGNOSIS — I5032 Chronic diastolic (congestive) heart failure: Secondary | ICD-10-CM

## 2022-12-22 DIAGNOSIS — I130 Hypertensive heart and chronic kidney disease with heart failure and stage 1 through stage 4 chronic kidney disease, or unspecified chronic kidney disease: Secondary | ICD-10-CM | POA: Diagnosis not present

## 2022-12-22 DIAGNOSIS — D631 Anemia in chronic kidney disease: Secondary | ICD-10-CM

## 2022-12-22 DIAGNOSIS — Z8719 Personal history of other diseases of the digestive system: Secondary | ICD-10-CM

## 2022-12-22 DIAGNOSIS — Z9889 Other specified postprocedural states: Secondary | ICD-10-CM | POA: Diagnosis not present

## 2022-12-22 LAB — MICROALBUMIN, URINE WAIVED
Creatinine, Urine Waived: 50 mg/dL (ref 10–300)
Microalb, Ur Waived: 150 mg/L — ABNORMAL HIGH (ref 0–19)
Microalb/Creat Ratio: >300 mg/g — ABNORMAL HIGH (ref <30)

## 2022-12-22 MED ORDER — PANTOPRAZOLE SODIUM 40 MG PO TBEC: 40.0000 mg | DELAYED_RELEASE_TABLET | Freq: Every day | ORAL | 1 refills | Status: DC

## 2022-12-22 MED ORDER — MECLIZINE HCL 12.5 MG PO TABS: 12.5000 mg | ORAL_TABLET | Freq: Three times a day (TID) | ORAL | 0 refills | Status: DC | PRN

## 2022-12-22 NOTE — Assessment & Plan Note (Signed)
Chronic, stable.  BP well below goal in office.  Continue current medication regimen and collaboration with nephrology.  Labs: CBC, CMP, urine ALB today.  Urine ALB 150 January 2024.  Recommend she continue to monitor BP at home daily and document for providers + focus on DASH diet.  Continue collaboration with cardiology and nephrology.

## 2022-12-22 NOTE — Assessment & Plan Note (Signed)
Chronic, ongoing, followed by nephrology with recent labs obtained.  Continue collaboration with nephrology.   Recent note and labs reviewed.

## 2022-12-22 NOTE — Assessment & Plan Note (Signed)
Chronic, ongoing.  Continue current medication regimen and collaboration with nephrology. Labs up to date and reviewed in Care Everywhere.

## 2022-12-22 NOTE — Assessment & Plan Note (Signed)
Chronic, ongoing.  Monitor closely due to AM low BS.  Continue collaboration with endocrinology.

## 2022-12-22 NOTE — Assessment & Plan Note (Signed)
Chronic, ongoing.  Continue Rosuvastatin. Continue to collaborate with [facility].   Lipid panel today.

## 2022-12-22 NOTE — Assessment & Plan Note (Signed)
Chronic, ongoing, followed by endocrinology.  A1c 8.5% with endo last check and urine ALB 150 January 2024. Continue current medication regimen and defer changes to endo.  Cardiology and PCP have recommended to endo discontinuing Actos due to her HF.  Continue to collaborate with Dr. [name] and CCM team. Recommend she monitor BS at least 3 times a day at home, continue consistent [place] use.  Return in 6 months as is being followed closely by endo.

## 2022-12-22 NOTE — Assessment & Plan Note (Signed)
On 09/04/22, will get into general surgery [facility] per discharged instructions for follow-up on bleed.

## 2022-12-22 NOTE — Assessment & Plan Note (Signed)
Chronic, ongoing with EF 60-65%, euvolemic at this time.  Continue current medication regimen and collaboration with cardiology + CCM team in office.  - Reminded to call for an overnight weight gain of >2 pounds or a weekly weight weight of >5 pounds -- work with CCM team on obtaining scale. - not adding salt to his food and has been reading food labels. Reviewed the importance of keeping daily sodium intake to '2000mg'$  daily  - Wear compression hose daily -- not on today and not consistently wearing - No Ibuprofen at home

## 2022-12-22 NOTE — Assessment & Plan Note (Signed)
Chronic, ongoing.  Continue collaboration with hematology and infusions.  If lower levels post bleed will see if can get back into hematology sooner then scheduled for infusion.

## 2022-12-22 NOTE — Progress Notes (Signed)
BP 120/68 (BP Location: Left Arm, Patient Position: Sitting)   Pulse 87   Temp 97.9 F (36.6 C) (Oral)   Ht 5' 7.01" (1.702 m)   Wt 147 lb 6.4 oz (66.9 kg)   LMP  (LMP Unknown)   SpO2 99%   BMI 23.08 kg/m    Subjective:    Patient ID: Tonya [name], female    DOB: Dec 12, 1946, 77 y.o.   MRN: [id number]  HPI: Tonya [name] is a 77 y.o. female  Chief Complaint  Patient presents with   Hospitalization Follow-up    Hematemesis, was vomiting blood, had surgery and was released after a 6 day stay. Patient is doing much better.    Transition of Care Hospital Follow up.  Tonya [name] was admitted to [facility] on 12/04/22 for acute GI bleed, discharged on 12/09/22.  Had bleed in esophagus, they worked on hernia and snipped part of her esophagus she reports.  Since discharge reports feeling better.  Denies any further bleeding episodes of hematemesis.  They stopped Plavix and continued ASA.  Placed her on Protonix for 60 days.    Continues to have some dizziness, she reports taking medication for this.    "Hematemesis  Symptomatic Anemia:  Patient presented w recent hx of dizzy spells over the last 3 weeks. Noted her stools have been dark for some time though she takes iron pills. Prior to admission, hematemesis with BRB x3. Hgb on admission 5.9 which responded appropriately to 2 units pRBC's. GI consulted, pursuing upper endoscopy 12/26 to allow for Plavix washout (last taken 12/20). Patient remains HDS, hgb stable from previous. No hematemesis noted since admission. Tolerated regular diet a day after admission. Transitioned back to clear's in preparation for EGD and had in-patient EGD completed 12/26 showing single healing cameron lesion. Patient was medically stable for discharge at that time and was discharged on PO PPI for 6 weeks at the recommendation of GI. Will follow-up with general surgery in January for ongoing hiatal hernia re-demonstrated on scope.   Acute episode of stuttering  Hx of  CVA (2018)  Admitted for CVA in 2018, was discharged on aspirin Plavix for 21 days but appears that she has continued that since then. No known indication for DAPT at this time. Acute episode of stuttering evening of 12/22 which was novel compared to examination on AM 12/22. On retrospective chart review, presentation for stroke with similar symptoms. Neuro examination non-focal, baseline strength throughout including residual left sided weakness from previous stroke. Patient noted to be shivering/?rigors on physical exam. Infectious work-up higher on differential at this time given recent hematememsis and non focal neurological exam. CXR and UA reassuring. Blood cultures obtained 12/22, NGTD. Shivering and stuttering acutely resolved 12/23 without re-demonstration while hospitalized. Patient will be discharged on ASA only (discontinuing plavix) as no indication for DAPT at this time."  Hospital/Facility: [facility] D/C Physician: Dr. Pincus Badder D/C Date: 12/09/22  Records Requested: 12/22/2022 Records Received: 12/22/2022 Records Reviewed: 12/22/2022  Diagnoses on Discharge: GI Bleed  Date of interactive Contact within 48 hours of discharge:  Contact was through: phone  Date of 7 day or 14 day face-to-face visit:    within 14 days (13 days)  Outpatient Encounter Medications as of 12/22/2022  Medication Sig   acetaminophen (TYLENOL) 650 MG CR tablet Take 650 mg by mouth every 8 (eight) hours as needed for pain.   aspirin 81 MG tablet Take 81 mg by mouth daily.   carvedilol (COREG) 3.125 MG tablet TAKE  1 TABLET BY MOUTH TWICE  DAILY WITH MEALS   docusate sodium (COLACE) 100 MG capsule Take 100 mg by mouth daily as needed for mild constipation.   ibuprofen (ADVIL) 600 MG tablet Take 1 tablet (600 mg total) by mouth every 6 (six) hours as needed.   insulin lispro (HUMALOG) 100 UNIT/ML injection Inject 8 Units into the skin 3 (three) times daily before meals.   methocarbamol (ROBAXIN) 500 MG tablet  Take 1 tablet (500 mg total) by mouth daily as needed for muscle spasms.   naloxone (NARCAN) nasal spray 4 mg/0.1 mL SMARTSIG:Both Nares   NOVOFINE 32G X 6 MM MISC USE AS DIRECTED THREE TIMES DAILY WITH HUMALOG   olmesartan (BENICAR) 40 MG tablet Take 1 tablet (40 mg total) by mouth daily. (Patient taking differently: Take 40 mg by mouth every morning.)   pioglitazone (ACTOS) 30 MG tablet Take 30 mg by mouth daily.   rosuvastatin (CRESTOR) 5 MG tablet Take 1 tablet (5 mg total) by mouth daily. (Patient taking differently: Take 5 mg by mouth every morning.)   torsemide (DEMADEX) 20 MG tablet Take 2 tablets (40 mg total) by mouth daily. (Patient taking differently: Take 40 mg by mouth every morning.)   vitamin B-12 (CYANOCOBALAMIN) 100 MCG tablet Take 100 mcg by mouth daily.   Vitamin D, Cholecalciferol, 50 MCG (2000 UT) CAPS Take 2,000 Units by mouth daily.   [DISCONTINUED] meclizine (ANTIVERT) 12.5 MG tablet Take 1 tablet (12.5 mg total) by mouth 3 (three) times daily as needed for dizziness.   [DISCONTINUED] pantoprazole (PROTONIX) 40 MG tablet Take 40 mg by mouth daily.   pantoprazole (PROTONIX) 40 MG tablet Take 1 tablet (40 mg total) by mouth daily.   [DISCONTINUED] clopidogrel (PLAVIX) 75 MG tablet TAKE 1 TABLET BY MOUTH DAILY (Patient not taking: Reported on 12/22/2022)   [DISCONTINUED] omeprazole (PRILOSEC) 40 MG capsule Take 1 capsule (40 mg total) by mouth in the morning and at bedtime. (Patient not taking: Reported on 12/22/2022)   No facility-administered encounter medications on file as of 12/22/2022.    Diagnostic Tests Reviewed/Disposition: reviewed within chart  Consults: GI and general surgery  Discharge Instructions: Follow-up with PCP and general surgery [facility]  Disease/illness Education: Reviewed with patient, including medications (wrote on bottles)  Home Health/Community Services Discussions/Referrals: none  Establishment or re-establishment of referral orders for community  resources: none  Discussion with other health care providers: reviewed all recent notes  Assessment and Support of treatment regimen adherence: Reviewed with patient and her husband at bedside  Appointments Coordinated with: Reviewed with patient and her husband at bedside  Education for self-management, independent living, and ADLs:  Reviewed with patient and her husband at bedside  DIABETES Saw Dr. [name] on 08/04/22 with A1c 8.5%, no changes made.   She is scheduled to see her for follow-up tomorrow.    Currently taking Humalog (8 units) and Actos -- does not always Humalog due to fear of low sugars.  Freestyle in place = did not bring meter today. Previously has tried Toujeo and Trulicity -- did not tolerate these.   Polydipsia/polyuria: no Visual disturbance: no Chest pain: no Paresthesias: no Glucose Monitoring: yes             Accucheck frequency: TID              Fasting glucose:             Post prandial:             Evening:  Before meals:              Taking Insulin?: yes             Long acting insulin: None, endo stopped             Short acting insulin: Humalog 8 units in morning and then 8 units at lunch and dinner Blood Pressure Monitoring: a few times a week Retinal Examination: Up to Date -- goes every 6 weeks Foot Exam: Up to Date Pneumovax: Up to Date Influenza: Up to Date Aspirin: yes    HYPERTENSION / [facility] Sees cardiology, Dr. [name] Situ, with last visit 07/01/22, no changes made. Continues on Carvedilol, Plavix, Torsemide, Olmesartan, and Crestor.   LVEF on 12/02/2019 was 60 to 65% with moderate LVH, and grade 1 diastolic dysfunction. Satisfied with current treatment? yes Duration of hypertension: chronic BP monitoring frequency: a few times a week BP range:  BP medication side effects: no Duration of hyperlipidemia: chronic Cholesterol medication side effects: no Cholesterol supplements: none Medication compliance: good  compliance Aspirin: yes Recent stressors: no Recurrent headaches: no Visual changes: no Palpitations: no Dyspnea: no Chest pain: no Lower extremity edema: at baseline, small amount Dizzy/lightheaded: no    CHRONIC KIDNEY DISEASE Followed by nephrology and last saw 10/29/22.  Hematology for anemia in CKD and last seen 08/25/22 -- she reports being schedule this week for infusion.  CRT 2.15, eGFR 24, PTH 120. CKD status: stable Medications renally dose: yes Previous renal evaluation: yes Pneumovax:  Up to Date Influenza Vaccine:  Up to Date   Relevant past medical, surgical, family and social history reviewed and updated as indicated. Interim medical history since our last visit reviewed. Allergies and medications reviewed and updated.  Review of Systems  Constitutional:  Negative for activity change, appetite change, diaphoresis, fatigue and fever.  Respiratory:  Negative for cough, chest tightness, shortness of breath and wheezing.   Cardiovascular:  Negative for chest pain, palpitations and leg swelling.  Gastrointestinal: Negative.   Neurological: Negative.   Psychiatric/Behavioral: Negative.      Per HPI unless specifically indicated above     Objective:    BP 120/68 (BP Location: Left Arm, Patient Position: Sitting)   Pulse 87   Temp 97.9 F (36.6 C) (Oral)   Ht 5' 7.01" (1.702 m)   Wt 147 lb 6.4 oz (66.9 kg)   LMP  (LMP Unknown)   SpO2 99%   BMI 23.08 kg/m   Wt Readings from Last 3 Encounters:  12/22/22 147 lb 6.4 oz (66.9 kg)  12/01/22 139 lb (63 kg)  10/23/22 140 lb (63.5 kg)    Physical Exam Vitals and nursing note reviewed.  Constitutional:      General: She is awake. She is not in acute distress.    Appearance: She is well-developed. She is not ill-appearing.  HENT:     Head: Normocephalic.     Right Ear: Hearing normal.     Left Ear: Hearing normal.  Eyes:     General: Lids are normal.        Right eye: No discharge.        Left eye: No  discharge.     Conjunctiva/sclera: Conjunctivae normal.     Pupils: Pupils are equal, round, and reactive to light.  Neck:     Thyroid: No thyromegaly.     Vascular: No carotid bruit.  Cardiovascular:     Rate and Rhythm: Normal rate and regular rhythm.  Heart sounds: Normal heart sounds. No murmur heard.    No gallop.  Pulmonary:     Effort: Pulmonary effort is normal. No accessory muscle usage or respiratory distress.     Breath sounds: Normal breath sounds.  Abdominal:     General: Bowel sounds are normal.     Palpations: Abdomen is soft.  Musculoskeletal:     Cervical back: Normal range of motion and neck supple.     Right lower leg: Edema (trace) present.     Left lower leg: Edema (trace) present.  Skin:    General: Skin is warm and dry.     Findings: No rash.  Neurological:     Mental Status: She is alert and oriented to person, place, and time.  Psychiatric:        Attention and Perception: Attention normal.        Mood and Affect: Mood normal.        Behavior: Behavior normal. Behavior is cooperative.        Thought Content: Thought content normal.        Judgment: Judgment normal.     Results for orders placed or performed in visit on 12/22/22  Microalbumin, Urine Waived  Result Value Ref Range   Microalb, Ur Waived 150 (H) 0 - 19 mg/L   Creatinine, Urine Waived 50 10 - 300 mg/dL   Microalb/Creat Ratio >300 (H) <30 mg/g      Assessment & Plan:   Problem List Items Addressed This Visit       Cardiovascular and Mediastinum   Chronic heart failure with preserved ejection fraction (HFpEF) (HCC)    Chronic, ongoing with EF 60-65%, euvolemic at this time.  Continue current medication regimen and collaboration with cardiology + CCM team in office.  - Reminded to call for an overnight weight gain of >2 pounds or a weekly weight weight of >5 pounds -- work with CCM team on obtaining scale. - not adding salt to his food and has been reading food labels. 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  BP well below goal in office.  Continue current medication regimen and collaboration with nephrology.  Labs: CBC, CMP, urine ALB today.  Urine ALB 150 January 2024.  Recommend she continue to monitor BP at home daily and document for providers + focus on DASH diet.  Continue collaboration with cardiology and nephrology.      Relevant Orders   Microalbumin, Urine Waived (Completed)   Comprehensive metabolic panel   TSH     Digestive   Acute upper GI bleeding    Acute and improved at this time.  No further bleeding reported.  Will check labs today and get into General Surgery [facility] per request.      Relevant Orders   CBC with Differential/Platelet   Vitamin B12   Iron Binding Cap (TIBC)([facility]/Sun[facility])   Ferritin   Ambulatory referral to General Surgery     Endocrine   Hyperlipidemia associated with type 2 diabetes mellitus (HCC)    Chronic, ongoing.  Continue Rosuvastatin. Continue to collaborate with [facility].   Lipid panel today.      Relevant  Medications   insulin lispro (HUMALOG) 100 UNIT/ML injection   pioglitazone (ACTOS) 30 MG tablet   Other Relevant Orders   Comprehensive metabolic panel   Lipid Panel w/o Chol/HDL Ratio   Hyperparathyroidism due to renal insufficiency (HCC)    Chronic, ongoing, followed by nephrology with recent labs obtained.  Continue collaboration with nephrology.   Recent note and labs reviewed.      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  A1c 8.5% with endo  last check and urine ALB 150 January 2024. Continue current medication regimen and defer changes to endo.  Cardiology and PCP have recommended to endo discontinuing Actos due to her HF.  Continue to collaborate with Dr. [name] and CCM team. Recommend she monitor BS at least 3 times a day at home, continue consistent [place] use.  Return in 6 months as is being followed closely by endo.      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  Continue collaboration with hematology and infusions.  If lower levels post bleed will see if can get back into hematology sooner then scheduled for infusion.      Relevant Orders   CBC with Differential/Platelet   Vitamin B12   Iron Binding Cap (TIBC)([facility])   Ferritin   Ambulatory referral to General Surgery   Chronic kidney disease, stage 4, severely decreased GFR (HCC)    Chronic, ongoing.  Continue current medication regimen and collaboration with nephrology. Labs up to date and reviewed in Care Everywhere.       Relevant Orders   Microalbumin, Urine Waived (Completed)   Comprehensive metabolic panel   VITAMIN D 25 Hydroxy (Vit-D Deficiency, Fractures)     Other   Encounter for long-term (current) use of insulin (HCC)    Chronic, ongoing.  Monitor closely due to AM low BS.  Continue collaboration with endocrinology.      Relevant Orders   Comprehensive metabolic panel   S/P repair of paraesophageal hernia    On 09/04/22, will get into general surgery [facility] per discharged instructions for follow-up on bleed.      Other Visit Diagnoses     Need for influenza vaccination       Relevant Orders   Flu Vaccine QUAD High Dose(Fluad) (Completed)        Follow up plan: Return in  about 3 weeks (around 01/12/2023) for BP check and recent GI bleed.

## 2022-12-22 NOTE — Assessment & Plan Note (Signed)
Acute and improved at this time.  No further bleeding reported.  Will check labs today and get into General Surgery [facility] per request.

## 2022-12-23 LAB — COMPREHENSIVE METABOLIC PANEL
ALT: 7 IU/L (ref 0–32)
AST: 14 IU/L (ref 0–40)
Albumin/Globulin Ratio: 1.8 (ref 1.2–2.2)
Albumin: 3.5 g/dL — ABNORMAL LOW (ref 3.8–4.8)
Alkaline Phosphatase: 62 IU/L (ref 44–121)
BUN/Creatinine Ratio: 16 (ref 12–28)
BUN: 35 mg/dL — ABNORMAL HIGH (ref 8–27)
Bilirubin Total: <0.2 mg/dL (ref 0.0–1.2)
CO2: 21 mmol/L (ref 20–29)
Calcium: 8.9 mg/dL (ref 8.7–10.3)
Chloride: 110 mmol/L — ABNORMAL HIGH (ref 96–106)
Creatinine, Ser: 2.17 mg/dL — ABNORMAL HIGH (ref 0.57–1.00)
Globulin, Total: 2 g/dL (ref 1.5–4.5)
Glucose: 217 mg/dL — ABNORMAL HIGH (ref 70–99)
Potassium: 5 mmol/L (ref 3.5–5.2)
Sodium: 143 mmol/L (ref 134–144)
Total Protein: 5.5 g/dL — ABNORMAL LOW (ref 6.0–8.5)
eGFR: 23 mL/min/{1.73_m2} — ABNORMAL LOW (ref >59)

## 2022-12-23 LAB — CBC WITH DIFFERENTIAL/PLATELET
Basophils Absolute: 0 10*3/uL (ref 0.0–0.2)
Basos: 0 %
EOS (ABSOLUTE): 0.2 10*3/uL (ref 0.0–0.4)
Eos: 2 %
Hematocrit: 27.3 % — ABNORMAL LOW (ref 34.0–46.6)
Hemoglobin: 8.6 g/dL — ABNORMAL LOW (ref 11.1–15.9)
Immature Grans (Abs): 0 10*3/uL (ref 0.0–0.1)
Immature Granulocytes: 0 %
Lymphocytes Absolute: 1.3 10*3/uL (ref 0.7–3.1)
Lymphs: 19 %
MCH: 28 pg (ref 26.6–33.0)
MCHC: 31.5 g/dL (ref 31.5–35.7)
MCV: 89 fL (ref 79–97)
Monocytes Absolute: 0.4 10*3/uL (ref 0.1–0.9)
Monocytes: 6 %
Neutrophils Absolute: 4.9 10*3/uL (ref 1.4–7.0)
Neutrophils: 73 %
Platelets: 284 10*3/uL (ref 150–450)
RBC: 3.07 x10E6/uL — ABNORMAL LOW (ref 3.77–5.28)
RDW: 14 % (ref 11.7–15.4)
WBC: 6.8 10*3/uL (ref 3.4–10.8)

## 2022-12-23 LAB — LIPID PANEL W/O CHOL/HDL RATIO
Cholesterol, Total: 184 mg/dL (ref 100–199)
HDL: 87 mg/dL (ref >39)
LDL Chol Calc (NIH): 80 mg/dL (ref 0–99)
Triglycerides: 94 mg/dL (ref 0–149)
VLDL Cholesterol Cal: 17 mg/dL (ref 5–40)

## 2022-12-23 LAB — VITAMIN B12: Vitamin B-12: 486 pg/mL (ref 232–1245)

## 2022-12-23 LAB — IRON AND TIBC
Iron Saturation: 28 % (ref 15–55)
Iron: 77 ug/dL (ref 27–139)
Total Iron Binding Capacity: 272 ug/dL (ref 250–450)
UIBC: 195 ug/dL (ref 118–369)

## 2022-12-23 LAB — VITAMIN D 25 HYDROXY (VIT D DEFICIENCY, FRACTURES): Vit D, 25-Hydroxy: 22.7 ng/mL — ABNORMAL LOW (ref 30.0–100.0)

## 2022-12-23 LAB — FERRITIN: Ferritin: 251 ng/mL — ABNORMAL HIGH (ref 15–150)

## 2022-12-23 LAB — TSH: TSH: 2.22 u[IU]/mL (ref 0.450–4.500)

## 2022-12-23 NOTE — Progress Notes (Signed)
Good afternoon, please let Tonya [name] know her labs have returned and I would like to see if hematology can see her sooner then the 16th when she is scheduled as her blood levels are trending down again.  Kidney function remains at baseline for her.  Vitamin D remains on lower side, continue supplement.  Remainder of labs stable.  I will forward labs to hematology too for review and see if they can get her in sooner or recommendations.  Any questions? Keep being wonderful!!  Thank you for allowing me to participate in your care.  I appreciate you. Kindest regards, Antavious Spanos

## 2022-12-24 ENCOUNTER — Telehealth: Payer: Self-pay

## 2022-12-24 ENCOUNTER — Telehealth: Payer: Self-pay | Admitting: Nurse Practitioner

## 2022-12-24 NOTE — Telephone Encounter (Signed)
Copied from [place] [id number]. Topic: General - Inquiry >> Dec 24, 2022  8:32 AM Marcellus Scott wrote: Reason for CRM: Melani from [facility] stated they received a referral for GI bleeding.Stated this referral should go to the [facility]. Melani provided the number below for the [facility].  [phone]   Please advise

## 2022-12-24 NOTE — Telephone Encounter (Signed)
Called pt, no answer, left detailed message to let her know that hemoglobin is trending down and Dr. [name] would like for her to come in this week for MD/ Retacrit.   Please schedule and notify pt of appt:   Cancel lab on 1/11 MD/ retacrit this week(Friday schedule looks better, ok to use NP slot)

## 2022-12-24 NOTE — Telephone Encounter (Signed)
-----   Message from Earlie Server, MD sent at 12/23/2022 11:02 PM EST ----- Thanks for the update.  Benjamine Mola, please check if we could move her appt up to this week, MD + retacrit thanks. No need for labs as she just had blood work done.   ----- Message ----- From: [name], NP Sent: 12/23/2022   1:05 PM EST To: Earlie Server, MD  Recent labs from yesterday for patient -- H/H is trending down again after recent bleed.  She sees you on 16th and did not know if you wanted to maintain this or see sooner. ----- Message ----- From: Lavone Neri Lab Results In Sent: 12/22/2022   4:36 PM EST To: [name], NP

## 2022-12-25 ENCOUNTER — Inpatient Hospital Stay: Payer: Medicare Other

## 2022-12-25 ENCOUNTER — Inpatient Hospital Stay: Payer: Medicare Other | Attending: Oncology | Admitting: Oncology

## 2022-12-25 ENCOUNTER — Encounter: Payer: Self-pay | Admitting: Oncology

## 2022-12-25 VITALS — BP 149/79 | HR 97 | Temp 96.1°F | Wt 144.3 lb

## 2022-12-25 DIAGNOSIS — N184 Chronic kidney disease, stage 4 (severe): Secondary | ICD-10-CM | POA: Diagnosis not present

## 2022-12-25 DIAGNOSIS — D631 Anemia in chronic kidney disease: Secondary | ICD-10-CM | POA: Insufficient documentation

## 2022-12-25 MED ORDER — EPOETIN ALFA-EPBX 10000 UNIT/ML IJ SOLN
20000.0000 [IU] | Freq: Once | INTRAMUSCULAR | Status: AC
  Administered 2022-12-25: 20000 [IU] via SUBCUTANEOUS
  Filled 2022-12-25: qty 2

## 2022-12-25 MED ORDER — EPOETIN ALFA-EPBX 10000 UNIT/ML IJ SOLN
10000.0000 [IU] | Freq: Once | INTRAMUSCULAR | Status: DC
  Filled 2022-12-25: qty 1

## 2022-12-25 NOTE — Assessment & Plan Note (Addendum)
History of iron deficiency anemia,  anemia secondary to chronic kidney disease. Labs reviewed and discussed patient. Acute drop of hemoglobin likely secondary to recent GI bleeding events. Iron panel shows ferritin at goal.  Ferritin may be falsely elevated due to recent surgery.  Close monitor. Recommend patient to proceed with Retacrit 20,000 units x 1. Close monitor counts.

## 2022-12-25 NOTE — Progress Notes (Addendum)
Hematology/Oncology Progress note [phone] [phone]      Patient Care Team: [name], NP as PCP - General (Nurse Practitioner) [name], MD as PCP - Cardiology (Cardiology) Solum, Betsey Holiday, MD as Physician Assistant (Endocrinology) Lavonia Dana, MD as Consulting Physician (Nephrology) [name], MD as Consulting Physician (Cardiology) Pa, [place] (Optometry) Earlie Server, MD as Consulting Physician (Oncology) Vanita Ingles, RN as Case Manager (General Practice)  ASSESSMENT & PLAN:   Anemia in stage 4 chronic kidney disease ([place]) History of iron deficiency anemia,  anemia secondary to chronic kidney disease. Labs reviewed and discussed patient. Acute drop of hemoglobin likely secondary to recent GI bleeding events. Iron panel shows ferritin at goal.  Ferritin may be falsely elevated due to recent surgery.  Close monitor. Recommend patient to proceed with Retacrit 20,000 units x 1. Close monitor counts.   Orders Placed This Encounter  Procedures   CBC with Differential/Platelet    Standing Status:   Future    Standing Expiration Date:   12/25/2023   Iron and TIBC([facility])    Standing Status:   Future    Standing Expiration Date:   12/26/2023   Ferritin    Standing Status:   Future    Standing Expiration Date:   12/26/2023   CBC with Differential/Platelet    Standing Status:   Future    Standing Expiration Date:   12/25/2023   CBC with Differential/Platelet    Standing Status:   Future    Standing Expiration Date:   12/25/2023   Follow-up per LOS   All questions were answered. The patient knows to call the clinic with any problems, questions or concerns.  Earlie Server, MD, PhD [facility] Hematology Oncology 12/25/2022   CHIEF COMPLAINTS/REASON FOR VISIT:  Follow-up for anemia due to chronic kidney disease  HISTORY OF PRESENTING ILLNESS:  Tonya [name] is a  77 y.o.  female with PMH listed below who  was referred to me for evaluation of anemia Reviewed patient's recent labs that was done at [facility] office. Labs revealed anemia with hemoglobin of 8.9, MCV 82, normal platelet and wbc  Reviewed patient's previous labs ordered by primary care physician's office, anemia is chronic onset , duration is since 2017 No aggravating or improving factors.  Associated signs and symptoms: Patient reports fatigue.  SOB with exertion.  Denies weight loss, easy bruising, hematochezia, hemoptysis, hematuria. Context: History of GI bleeding: denies               History of Chronic kidney disease: CKD, follows up with Dr.Kolluru.                History of autoimmune disease: denies               History of hemolytic anemia. denies               Last colonoscopy: underwent colonoscopy on 06/06/2019.  Due to poor preparation, patient was scheduled to repeat colonoscopy due to suboptimal bowel preparation.  Patient told me that she is waiting for insurance approval for repeat procedure.  Previously tolerated IV Venofer treatments very well.  INTERVAL HISTORY Tonya [name] is a 77 y.o. female who has above history reviewed by me today presents to reestablish care for chronic anemia. + fatigue is worse.  Patient has a recent acute drop of hemoglobin to 5.9, status post PRBC transfusion at [facility].  12/10/2023 Upper endoscopy showed slipped fundoplication in the healing  Cameron's ulceration in the hernia but fortunately with no evidence of active bleeding.  She was discharged with PPI for 6 weeks. Hemoglobin at discharge on 12/10/2019 was 8.6. 12/22/22 she had a blood regimen at primary care provider's office.  CBC showed a hemoglobin of 8.6, iron saturation 28, ferritin 251.  Review of Systems  Constitutional:  Positive for fatigue. Negative for appetite change, chills and fever.  HENT:   Negative for hearing loss and voice change.   Eyes:  Negative for eye problems.  Respiratory:  Negative for chest tightness and  cough.   Cardiovascular:  Negative for chest pain.  Gastrointestinal:  Negative for abdominal distention, abdominal pain and blood in stool.  Endocrine: Negative for hot flashes.  Genitourinary:  Negative for difficulty urinating and frequency.   Musculoskeletal:  Negative for arthralgias.  Skin:  Negative for itching and rash.  Neurological:  Negative for extremity weakness.  Hematological:  Negative for adenopathy.  Psychiatric/Behavioral:  Negative for confusion.     MEDICAL HISTORY:  Past Medical History:  Diagnosis Date   (HFpEF) heart failure with preserved ejection fraction ([place])    a.) TTE 05/29/2017: EF 55-60%, mild LAE, G1DD; b.) TTE 09/07/2017: EF >55%, mild LAE, Ao sclerosis without stenosis, degen MV disease, G2DD; c.) TTE 12/02/2018: EF 55-60%, mild LVH, Mild BAE, triv TR, G1DD; d.) TTE 11/22/2019: EF 60-65%, mod LVH, mild TR, G1DD   Anemia in stage 4 chronic kidney disease (HCC)    Arthritis    Cardiomyopathy ([place])    CKD (chronic kidney disease) stage 4, GFR 15-29 ml/min (HCC)    CVA (cerebral vascular accident) ([place]) 08/2017   a.) no residual deficits   Diabetic neuropathy (HCC)    Diverticulitis    GERD (gastroesophageal reflux disease)    History of hiatal hernia    Hyperlipidemia    Hypertension    Long term current use of antithrombotics/antiplatelets    a.) DAPT therapy (ASA + clopidogrel)   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 ([place])    Paraesophageal hernia    PHN (postherpetic neuralgia)    Sarcoidosis    Shingles    Type 2 diabetes mellitus treated with insulin ([place])    Wears dentures    partial upper    SURGICAL HISTORY: Past Surgical History:  Procedure Laterality Date   CATARACT EXTRACTION W/PHACO Left 02/05/2021   Procedure: CATARACT EXTRACTION PHACO AND INTRAOCULAR LENS PLACEMENT ([place]) LEFT DIABETIC 5.47 00:51.4;  Surgeon: Birder Robson, MD;  Location: [place];  Service: Ophthalmology;  Laterality:  Left;  Diabetic - insulin and oral meds   CATARACT EXTRACTION W/PHACO Right 03/19/2021   Procedure: CATARACT EXTRACTION PHACO AND INTRAOCULAR LENS PLACEMENT ([place]) RIGHT DIABETIC 6.09 00:39.6;  Surgeon: Birder Robson, MD;  Location: [place];  Service: Ophthalmology;  Laterality: Right;   COLONOSCOPY  12/15/2006   COLONOSCOPY WITH PROPOFOL N/A 06/06/2019   Procedure: COLONOSCOPY WITH PROPOFOL;  Surgeon: [name], MD;  Location: [facility] ENDOSCOPY;  Service: Gastroenterology;  Laterality: N/A;   COLONOSCOPY WITH PROPOFOL N/A 01/16/2020   Procedure: COLONOSCOPY WITH BIOPSY;  Surgeon: [name], MD;  Location: [place];  Service: Endoscopy;  Laterality: N/A;  Diabetic - insulin and oral meds   ESOPHAGOGASTRODUODENOSCOPY (EGD) WITH PROPOFOL N/A 06/23/2022   Procedure: ESOPHAGOGASTRODUODENOSCOPY (EGD) WITH PROPOFOL;  Surgeon: [name], MD;  Location[facility] ENDOSCOPY;  Service: Gastroenterology;  Laterality: N/A;   EYE SURGERY     INSERTION OF MESH  08/26/2022   Procedure: INSERTION OF MESH;  Surgeon: [name], MD;  Location: [facility];  Service: General;;   POLYPECTOMY N/A 01/16/2020   Procedure: POLYPECTOMY;  Surgeon: [name], MD;  Location: [place];  Service: Endoscopy;  Laterality: N/A;   TUBAL LIGATION     XI ROBOTIC ASSISTED PARAESOPHAGEAL HERNIA REPAIR N/A 08/26/2022   Procedure: XI ROBOTIC ASSISTED PARAESOPHAGEAL HERNIA REPAIR, RNFA to assist;  Surgeon: [name], MD;  Location: [facility];  Service: General;  Laterality: N/A;    SOCIAL HISTORY: Social History   Socioeconomic History   Marital status: Married    Spouse name: Not on file   Number of children: Not on file   Years of education: Not on file   Highest education level: GED or equivalent  Occupational History   Occupation: retired  Tobacco Use   Smoking status: Never   Smokeless tobacco: Never  Vaping Use   Vaping Use: Never used  Substance and Sexual Activity   Alcohol  use: No    Alcohol/week: 0.0 standard drinks of alcohol   Drug use: No   Sexual activity: Yes  Other Topics Concern   Not on file  Social History Narrative   Not on file   Social Determinants of Health   Financial Resource Strain: Low Risk  (11/18/2022)   Overall Financial Resource Strain (CARDIA)    Difficulty of Paying Living Expenses: Not hard at all  Food Insecurity: No Food Insecurity (12/10/2022)   Hunger Vital Sign    Worried About Running Out of Food in the Last Year: Never true    [place] in the Last Year: Never true  Transportation Needs: No Transportation Needs (12/10/2022)   PRAPARE - Hydrologist (Medical): No    Lack of Transportation (Non-Medical): No  Physical Activity: Inactive (11/18/2022)   Exercise Vital Sign    Days of Exercise per Week: 0 days    Minutes of Exercise per Session: 0 min  Stress: No Stress Concern Present (11/18/2022)   [place]    Feeling of Stress : Not at all  Social Connections: [place] (11/18/2022)   Social Connection and Isolation Panel [NHANES]    Frequency of Communication with Friends and Family: More than three times a week    Frequency of Social Gatherings with Friends and Family: More than three times a week    Attends Religious Services: More than 4 times per year    Active Member of [employer] or Organizations: Yes    Attends Music therapist: More than 4 times per year    Marital Status: Married  Human resources officer Violence: Not At Risk (11/18/2022)   Humiliation, Afraid, Rape, and Kick questionnaire    Fear of Current or Ex-Partner: No    Emotionally Abused: No    Physically Abused: No    Sexually Abused: No    FAMILY HISTORY: Family History  Problem Relation Age of Onset   Diabetes Mother    Stroke Mother    Hyperlipidemia Father    Hypertension Father    Diabetes Sister    Diabetes Brother     Gout Son    Diabetes Brother    Kidney disease Son    Lymphoma Sister    Heart attack Sister    Breast cancer Neg Hx     ALLERGIES:  is allergic to atorvastatin, gabapentin, pravastatin, pregabalin, and trulicity [dulaglutide].  MEDICATIONS:  Current Outpatient Medications  Medication Sig  Dispense Refill   acetaminophen (TYLENOL) 650 MG CR tablet Take 650 mg by mouth every 8 (eight) hours as needed for pain.     aspirin 81 MG tablet Take 81 mg by mouth daily.     carvedilol (COREG) 3.125 MG tablet TAKE 1 TABLET BY MOUTH TWICE  DAILY WITH MEALS 180 tablet 3   docusate sodium (COLACE) 100 MG capsule Take 100 mg by mouth daily as needed for mild constipation.     insulin lispro (HUMALOG) 100 UNIT/ML injection Inject 8 Units into the skin 3 (three) times daily before meals.     methocarbamol (ROBAXIN) 500 MG tablet Take 1 tablet (500 mg total) by mouth daily as needed for muscle spasms. 30 tablet 2   naloxone (NARCAN) nasal spray 4 mg/0.1 mL SMARTSIG:Both Nares     NOVOFINE 32G X 6 MM MISC USE AS DIRECTED THREE TIMES DAILY WITH HUMALOG 100 each 3   olmesartan (BENICAR) 40 MG tablet Take 1 tablet (40 mg total) by mouth daily. (Patient taking differently: Take 40 mg by mouth every morning.) 90 tablet 4   pantoprazole (PROTONIX) 40 MG tablet Take 1 tablet (40 mg total) by mouth daily. 90 tablet 1   pioglitazone (ACTOS) 30 MG tablet Take 30 mg by mouth daily.     rosuvastatin (CRESTOR) 5 MG tablet Take 1 tablet (5 mg total) by mouth daily. (Patient taking differently: Take 5 mg by mouth every morning.) 90 tablet 3   torsemide (DEMADEX) 20 MG tablet Take 2 tablets (40 mg total) by mouth daily. (Patient taking differently: Take 40 mg by mouth every morning.) 180 tablet 3   vitamin B-12 (CYANOCOBALAMIN) 100 MCG tablet Take 100 mcg by mouth daily.     Vitamin D, Cholecalciferol, 50 MCG (2000 UT) CAPS Take 2,000 Units by mouth daily.     No current facility-administered medications for this visit.      PHYSICAL EXAMINATION: ECOG PERFORMANCE STATUS: 1 - Symptomatic but completely ambulatory Vitals:   12/25/22 1103  BP: (!) 149/79  Pulse: 97  Temp: (!) 96.1 F (35.6 C)  SpO2: 100%   Filed Weights   12/25/22 1103  Weight: 144 lb 4.8 oz (65.5 kg)    Physical Exam Constitutional:      General: She is not in acute distress. HENT:     Head: Normocephalic and atraumatic.  Eyes:     General: No scleral icterus.    Pupils: Pupils are equal, round, and reactive to light.  Cardiovascular:     Rate and Rhythm: Normal rate.  Pulmonary:     Effort: Pulmonary effort is normal. No respiratory distress.     Breath sounds: No wheezing.  Abdominal:     General: Bowel sounds are normal. There is no distension.     Palpations: Abdomen is soft.  Musculoskeletal:        General: No deformity. Normal range of motion.     Cervical back: Normal range of motion and neck supple.  Skin:    General: Skin is warm and dry.     Findings: No erythema or rash.  Neurological:     Mental Status: She is alert and oriented to person, place, and time.     Cranial Nerves: No cranial nerve deficit.     Coordination: Coordination normal.  Psychiatric:        Mood and Affect: Mood normal.      LABORATORY DATA:  I have reviewed the data as listed    Latest Ref Rng &  Units 12/22/2022    4:00 PM 11/17/2022    9:44 AM 10/20/2022   10:16 AM  CBC  WBC 3.4 - 10.8 x10E3/uL 6.8     Hemoglobin 11.1 - 15.9 g/dL 8.6  10.4  9.7   Hematocrit 34.0 - 46.6 % 27.3  33.9  31.5   Platelets 150 - 450 x10E3/uL 284         Latest Ref Rng & Units 12/22/2022    4:00 PM 08/27/2022    4:42 AM 08/26/2022    4:13 PM  CMP  Glucose 70 - 99 mg/dL 217  77    BUN 8 - 27 mg/dL 35  61    Creatinine 0.57 - 1.00 mg/dL 2.17  2.65  2.53   Sodium 134 - 144 mmol/L 143  144    Potassium 3.5 - 5.2 mmol/L 5.0  4.5    Chloride 96 - 106 mmol/L 110  118    CO2 20 - 29 mmol/L 21  21    Calcium 8.7 - 10.3 mg/dL 8.9  8.2    Total  Protein 6.0 - 8.5 g/dL 5.5     Total Bilirubin 0.0 - 1.2 mg/dL <0.2     Alkaline Phos 44 - 121 IU/L 62     AST 0 - 40 IU/L 14     ALT 0 - 32 IU/L 7      Lab Results  Component Value Date   IRON 77 12/22/2022   TIBC 272 12/22/2022   FERRITIN 251 (H) 12/22/2022

## 2022-12-29 ENCOUNTER — Telehealth: Payer: Self-pay

## 2022-12-29 ENCOUNTER — Ambulatory Visit
Payer: Medicare Other | Attending: Student in an Organized Health Care Education/Training Program | Admitting: Student in an Organized Health Care Education/Training Program

## 2022-12-29 ENCOUNTER — Encounter: Payer: Self-pay | Admitting: Student in an Organized Health Care Education/Training Program

## 2022-12-29 DIAGNOSIS — G894 Chronic pain syndrome: Secondary | ICD-10-CM | POA: Diagnosis not present

## 2022-12-29 DIAGNOSIS — M47816 Spondylosis without myelopathy or radiculopathy, lumbar region: Secondary | ICD-10-CM

## 2022-12-29 NOTE — Progress Notes (Signed)
Patient: Tonya [name]  Service Category: E/M  Provider: Gillis Santa, MD  DOB: 1946-07-27  DOS: 12/29/2022  Location: Office  MRN: [id number]  Setting: Ambulatory outpatient  Referring Provider: [name], NP  Type: Established Patient  Specialty: Interventional Pain Management  PCP: [name], NP  Location: Remote location  Delivery: TeleHealth     Virtual Encounter - Pain Management PROVIDER NOTE: Information contained herein reflects review and annotations entered in association with encounter. Interpretation of such information and data should be left to medically-trained personnel. Information provided to patient can be located elsewhere in the medical record under "Patient Instructions". Document created using STT-dictation technology, any transcriptional errors that may result from process are unintentional.    Contact & Pharmacy Preferred: [phone] Home: [phone] (home) Mobile: [phone] (mobile) E-mail: [email or web address]  [facility] [place], [address] [place] [address] Phone: [phone] Fax: [phone]  [facility] ([place] [place] [address] Phone: [phone] Fax: [place], [place] [address] Phone: [phone] Fax: [phone]   Pre-screening  Ms. Ellzey offered "in-person" vs "virtual" encounter. She indicated preferring virtual for this encounter.   Reason COVID-19*  Social distancing based on CDC and AMA recommendations.   I contacted [name] on 12/29/2022 via telephone.      I clearly identified myself as Gillis Santa, MD. I verified that I was speaking with the correct person using two identifiers (Name: [name], and date of birth:  October 08, 1946).  Consent I sought verbal advanced consent from [name] for virtual visit interactions. I informed [name] of possible security and privacy concerns, risks, and limitations associated with providing "not-in-person" medical evaluation and management services. I also informed [name] of the availability of "in-person" appointments. Finally, I informed her that there would be a charge for the virtual visit and that she could be  personally, fully or partially, financially responsible for it. [name] expressed understanding and agreed to proceed.   Historic Elements   Ms. [name] is a 77 y.o. year old, female patient evaluated today after our last contact on 12/01/2022. Ms. 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 She also  has a past surgical history that includes Tubal ligation; Colonoscopy (12/15/2006); Colonoscopy with propofol (N/A, 06/06/2019); Colonoscopy with propofol (N/A, 01/16/2020); polypectomy (N/A, 01/16/2020); Cataract extraction w/PHACO (Left, 02/05/2021); Cataract extraction w/PHACO (Right, 03/19/2021); Eye surgery; Esophagogastroduodenoscopy (egd) with propofol (N/A, 06/23/2022); Xi robotic assisted paraesophageal hernia repair (N/A, 08/26/2022); and Insertion of mesh (08/26/2022). [name] has a current  medication list which includes the following prescription(s): acetaminophen, aspirin, carvedilol, docusate sodium, insulin lispro, meclizine, methocarbamol, naloxone, novofine, olmesartan, pantoprazole, pioglitazone, rosuvastatin, torsemide, vitamin b-12, and vitamin d3. She  reports  that she has never smoked. She has never used smokeless tobacco. She reports that she does not drink alcohol and does not use drugs. Ms. Fikes is allergic to atorvastatin, gabapentin, pravastatin, pregabalin, and trulicity [dulaglutide].  Estimated body mass index is 22.6 kg/m as calculated from the following:   Height as of 12/22/22: 5' 7.01" (1.702 m).   Weight as of 12/25/22: 144 lb 4.8 oz (65.5 kg).  HPI  Today, she is being contacted for a post-procedure assessment.   Post-procedure evaluation   Type: Lumbar Facet, Medial Branch Radiofrequency Ablation (RFA) #1  Laterality: Right (-RT)  Level: L3, L4, L5, Medial Branch Level(s). T Imaging: Fluoroscopy-guided         Anesthesia: Local anesthesia (1-2% Lidocaine) Anxiolysis: IV Versed 2.0 mg DOS: 12/01/2022  Performed by: Gillis Santa, MD  Pt d/c'd Plavix 7 days prior  Purpose: Therapeutic/Palliative Indications: Low back pain severe enough to impact quality of life or function. Indications: 1. Lumbar facet arthropathy   2. Lumbar spondylosis   3. Chronic pain syndrome    [name] has been dealing with the above chronic pain for longer than three months and has either failed to respond, was unable to tolerate, or simply did not get enough benefit from other more conservative therapies including, but not limited to: 1. Over-the-counter medications 2. Anti-inflammatory medications 3. Muscle relaxants 4. Membrane stabilizers 5. Opioids 6. Physical therapy and/or chiropractic manipulation 7. Modalities (Heat, ice, etc.) 8. Invasive techniques such as nerve blocks. [name] has attained more than 50% relief of the pain from a series of diagnostic  injections conducted in separate occasions.  Pain Score: Pre-procedure: 8 /10 Post-procedure: 1 /10     Effectiveness:  Initial hour after procedure: 0 %  Subsequent 4-6 hours post-procedure: 0 %  Analgesia past initial 6 hours: 50 % (ongoing)  Ongoing improvement:  Analgesic:  50% Function: Somewhat improved ROM: Somewhat improved   Laboratory Chemistry Profile   Renal Lab Results  Component Value Date   BUN 35 (H) 12/22/2022   CREATININE 2.17 (H) 12/22/2022   BCR 16 12/22/2022   GFRAA 20 (L) 01/29/2021   GFRNONAA 18 (L) 08/27/2022    Hepatic Lab Results  Component Value Date   AST 14 12/22/2022   ALT 7 12/22/2022   ALBUMIN 3.5 (L) 12/22/2022   ALKPHOS 62 12/22/2022    Electrolytes Lab Results  Component Value Date   NA 143 12/22/2022   K 5.0 12/22/2022   CL 110 (H) 12/22/2022   CALCIUM 8.9 12/22/2022   MG 2.1 08/05/2022    Bone Lab Results  Component Value Date   VD25OH 22.7 (L) 12/22/2022    Inflammation (CRP: Acute Phase) (ESR: Chronic Phase) Lab Results  Component Value Date   CRP 1.1 04/14/2018   ESRSEDRATE 14 04/14/2018         Note: Above Lab results reviewed.   Assessment  The primary encounter diagnosis was Lumbar facet arthropathy. Diagnoses of Lumbar spondylosis and Chronic pain syndrome were also pertinent to this visit.  Plan of Care  Moderate analgesic and functional response after right L3, L4, L5 RFA.  She does find it easier to perform ADLs and states that she has approximately 50% less pain on the right side of her lower back.  She states that some days are worse than others and on those days she has to modify her activity and exertion.  We will continue to monitor her symptoms.  Follow-up in 3 months.  Future considerations could include  right L4 peripheral nerve stimulation of medial branch however I will wait to talk to the patient about that further when she follows up.   Follow-up plan:   Return for RFA vs PNS.      Postherpetic neuralgia: Failed gabapentin, Lyrica, Cymbalta, tramadol.   Right L3,4,5 RFA 12/01/22   Recent Visits Date Type Provider Dept  12/01/22 Procedure visit Gillis Santa, MD [facility]  10/23/22 Office Visit Gillis Santa, MD [facility]  Showing recent visits within past 90 days and meeting all other requirements Today's Visits Date Type Provider Dept  12/29/22 Office Visit Gillis Santa, MD [facility]  Showing today's visits and meeting all other requirements Future Appointments No visits were found meeting these conditions. Showing future appointments within next 90 days and meeting all other requirements  I discussed the assessment and treatment plan with the patient. The patient was provided an opportunity to ask questions and all were answered. The patient agreed with the plan and demonstrated an understanding of the instructions.  Patient advised to call back or seek an in-person evaluation if the symptoms or condition worsens.  Duration of encounter: 40mnutes.  Note by: BGillis Santa MD Date: 12/29/2022; Time: 3:19 PM

## 2022-12-30 ENCOUNTER — Ambulatory Visit: Payer: Medicare Other

## 2022-12-30 ENCOUNTER — Ambulatory Visit: Payer: Medicare Other | Admitting: Oncology

## 2023-01-02 DIAGNOSIS — E1121 Type 2 diabetes mellitus with diabetic nephropathy: Secondary | ICD-10-CM | POA: Diagnosis not present

## 2023-01-02 DIAGNOSIS — Z794 Long term (current) use of insulin: Secondary | ICD-10-CM | POA: Diagnosis not present

## 2023-01-02 DIAGNOSIS — R809 Proteinuria, unspecified: Secondary | ICD-10-CM | POA: Diagnosis not present

## 2023-01-02 DIAGNOSIS — E1129 Type 2 diabetes mellitus with other diabetic kidney complication: Secondary | ICD-10-CM | POA: Diagnosis not present

## 2023-01-02 DIAGNOSIS — E113393 Type 2 diabetes mellitus with moderate nonproliferative diabetic retinopathy without macular edema, bilateral: Secondary | ICD-10-CM | POA: Diagnosis not present

## 2023-01-02 DIAGNOSIS — E1122 Type 2 diabetes mellitus with diabetic chronic kidney disease: Secondary | ICD-10-CM | POA: Diagnosis not present

## 2023-01-02 DIAGNOSIS — N184 Chronic kidney disease, stage 4 (severe): Secondary | ICD-10-CM | POA: Diagnosis not present

## 2023-01-08 ENCOUNTER — Inpatient Hospital Stay: Payer: Medicare Other

## 2023-01-09 ENCOUNTER — Encounter: Payer: Self-pay | Admitting: Pulmonary Disease

## 2023-01-10 NOTE — Patient Instructions (Incomplete)
February 19th GI surgery at 9:00 am at [facility] Address: [address], [place], [place] [address] Phone: [phone]  DASH Eating Plan [place] stands for Dietary Approaches to Stop Hypertension. The DASH eating plan is a healthy eating plan that has been shown to: Reduce high blood pressure (hypertension). Reduce your risk for type 2 diabetes, heart disease, and stroke. Help with weight loss. What are tips for following this plan? Reading food labels Check food labels for the amount of salt (sodium) per serving. Choose foods with less than 5 percent of the Daily Value of sodium. Generally, foods with less than 300 milligrams (mg) of sodium per serving fit into this eating plan. To find whole grains, look for the word "whole" as the first word in the ingredient list. Shopping Buy products labeled as "low-sodium" or "no salt added." Buy fresh foods. Avoid canned foods and pre-made or frozen meals. Cooking Avoid adding salt when cooking. Use salt-free seasonings or herbs instead of table salt or sea salt. Check with your health care provider or pharmacist before using salt substitutes. Do not fry foods. Cook foods using healthy methods such as baking, boiling, grilling, roasting, and broiling instead. Cook with heart-healthy oils, such as olive, canola, avocado, soybean, or sunflower oil. Meal planning  Eat a balanced diet that includes: 4 or more servings of fruits and 4 or more servings of vegetables each day. Try to fill one-half of your plate with fruits and vegetables. 6-8 servings of whole grains each day. Less than 6 oz (170 g) of lean meat, poultry, or fish each day. A 3-oz (85-g) serving of meat is about the same size as a deck of cards. One egg equals 1 oz (28 g). 2-3 servings of low-fat dairy each day. One serving is 1 cup (237 mL). 1 serving of nuts, seeds, or beans 5 times each week. 2-3 servings of heart-healthy fats. Healthy fats called omega-3 fatty acids are found in  foods such as walnuts, flaxseeds, fortified milks, and eggs. These fats are also found in cold-water fish, such as sardines, salmon, and mackerel. Limit how much you eat of: Canned or prepackaged foods. Food that is high in trans fat, such as some fried foods. Food that is high in saturated fat, such as fatty meat. Desserts and other sweets, sugary drinks, and other foods with added sugar. Full-fat dairy products. Do not salt foods before eating. Do not eat more than 4 egg yolks a week. Try to eat at least 2 vegetarian meals a week. Eat more home-cooked food and less restaurant, buffet, and fast food. Lifestyle When eating at a restaurant, ask that your food be prepared with less salt or no salt, if possible. If you drink alcohol: Limit how much you use to: 0-1 drink a day for women who are not pregnant. 0-2 drinks a day for men. Be aware of how much alcohol is in your drink. In the [place] one drink equals one 12 oz bottle of beer (355 mL), one 5 oz glass of wine (148 mL), or one 1 oz glass of hard liquor (44 mL). General information Avoid eating more than 2,300 mg of salt a day. If you have hypertension, you may need to reduce your sodium intake to 1,500 mg a day. Work with your health care provider to maintain a healthy body weight or to lose weight. Ask what an ideal weight is for you. Get at least 30 minutes of exercise that causes your heart to beat faster (  aerobic exercise) most days of the week. Activities may include walking, swimming, or biking. Work with your health care provider or dietitian to adjust your eating plan to your individual calorie needs. What foods should I eat? Fruits All fresh, dried, or frozen fruit. Canned fruit in natural juice (without added sugar). Vegetables Fresh or frozen vegetables (raw, steamed, roasted, or grilled). Low-sodium or reduced-sodium tomato and vegetable juice. Low-sodium or reduced-sodium tomato sauce and tomato paste. Low-sodium or  reduced-sodium canned vegetables. Grains Whole-grain or whole-wheat bread. Whole-grain or whole-wheat pasta. Brown rice. [place]. Bulgur. Whole-grain and low-sodium cereals. Pita bread. Low-fat, low-sodium crackers. Whole-wheat flour tortillas. Meats and other proteins Skinless chicken or [place]. Ground chicken or [place]. Pork with fat trimmed off. Fish and seafood. Egg whites. Dried beans, peas, or lentils. Unsalted nuts, nut butters, and seeds. Unsalted canned beans. Lean cuts of beef with fat trimmed off. Low-sodium, lean precooked or cured meat, such as sausages or meat loaves. Dairy Low-fat (1%) or fat-free (skim) milk. Reduced-fat, low-fat, or fat-free cheeses. Nonfat, low-sodium ricotta or cottage cheese. Low-fat or nonfat yogurt. Low-fat, low-sodium cheese. Fats and oils Soft margarine without trans fats. Vegetable oil. Reduced-fat, low-fat, or light mayonnaise and salad dressings (reduced-sodium). Canola, safflower, olive, avocado, soybean, and sunflower oils. Avocado. Seasonings and condiments Herbs. Spices. Seasoning mixes without salt. Other foods Unsalted popcorn and pretzels. Fat-free sweets. The items listed above may not be a complete list of foods and beverages you can eat. Contact a dietitian for more information. What foods should I avoid? Fruits Canned fruit in a light or heavy syrup. Fried fruit. Fruit in cream or butter sauce. Vegetables Creamed or fried vegetables. Vegetables in a cheese sauce. Regular canned vegetables (not low-sodium or reduced-sodium). Regular canned tomato sauce and paste (not low-sodium or reduced-sodium). Regular tomato and vegetable juice (not low-sodium or reduced-sodium). Angie Fava. Olives. Grains Baked goods made with fat, such as croissants, muffins, or some breads. Dry pasta or rice meal packs. Meats and other proteins Fatty cuts of meat. Ribs. Fried meat. Berniece Salines. Bologna, salami, and other precooked or cured meats, such as sausages or  meat loaves. Fat from the back of a pig (fatback). Bratwurst. Salted nuts and seeds. Canned beans with added salt. Canned or smoked fish. Whole eggs or egg yolks. Chicken or [place] with skin. Dairy Whole or 2% milk, cream, and half-and-half. Whole or full-fat cream cheese. Whole-fat or sweetened yogurt. Full-fat cheese. Nondairy creamers. Whipped toppings. Processed cheese and cheese spreads. Fats and oils Butter. Stick margarine. Lard. Shortening. Ghee. Bacon fat. Tropical oils, such as coconut, palm kernel, or palm oil. Seasonings and condiments Onion salt, garlic salt, seasoned salt, table salt, and sea salt. Worcestershire sauce. Tartar sauce. Barbecue sauce. Teriyaki sauce. Soy sauce, including reduced-sodium. Steak sauce. Canned and packaged gravies. Fish sauce. Oyster sauce. Cocktail sauce. Store-bought horseradish. Ketchup. Mustard. Meat flavorings and tenderizers. Bouillon cubes. Hot sauces. Pre-made or packaged marinades. Pre-made or packaged taco seasonings. Relishes. Regular salad dressings. Other foods Salted popcorn and pretzels. The items listed above may not be a complete list of foods and beverages you should avoid. Contact a dietitian for more information. Where to find more information National Heart, Lung, and Blood Institute: https://wilson-eaton.com/ American Heart Association: www.heart.org Academy of Nutrition and Dietetics: www.eatright.[place]: www.kidney.org Summary The DASH eating plan is a healthy eating plan that has been shown to reduce high blood pressure (hypertension). It may also reduce your risk for type 2 diabetes, heart disease, and stroke. When on the  DASH eating plan, aim to eat more fresh fruits and vegetables, whole grains, lean proteins, low-fat dairy, and heart-healthy fats. With the DASH eating plan, you should limit salt (sodium) intake to 2,300 mg a day. If you have hypertension, you may need to reduce your sodium intake to 1,500 mg a  day. Work with your health care provider or dietitian to adjust your eating plan to your individual calorie needs. This information is not intended to replace advice given to you by your health care provider. Make sure you discuss any questions you have with your health care provider. Document Revised: 11/04/2019 Document Reviewed: 11/04/2019 Elsevier Patient Education  [place].

## 2023-01-12 ENCOUNTER — Encounter: Payer: Self-pay | Admitting: Nurse Practitioner

## 2023-01-12 ENCOUNTER — Ambulatory Visit (INDEPENDENT_AMBULATORY_CARE_PROVIDER_SITE_OTHER): Payer: Medicare Other | Admitting: Nurse Practitioner

## 2023-01-12 ENCOUNTER — Ambulatory Visit: Payer: Medicare Other | Admitting: Surgery

## 2023-01-12 VITALS — BP 102/70 | HR 74 | Temp 97.8°F | Ht 67.01 in | Wt 142.2 lb

## 2023-01-12 DIAGNOSIS — M533 Sacrococcygeal disorders, not elsewhere classified: Secondary | ICD-10-CM | POA: Diagnosis not present

## 2023-01-12 DIAGNOSIS — M5136 Other intervertebral disc degeneration, lumbar region: Secondary | ICD-10-CM

## 2023-01-12 DIAGNOSIS — N184 Chronic kidney disease, stage 4 (severe): Secondary | ICD-10-CM | POA: Diagnosis not present

## 2023-01-12 DIAGNOSIS — D631 Anemia in chronic kidney disease: Secondary | ICD-10-CM | POA: Diagnosis not present

## 2023-01-12 DIAGNOSIS — I130 Hypertensive heart and chronic kidney disease with heart failure and stage 1 through stage 4 chronic kidney disease, or unspecified chronic kidney disease: Secondary | ICD-10-CM

## 2023-01-12 DIAGNOSIS — Z8719 Personal history of other diseases of the digestive system: Secondary | ICD-10-CM | POA: Diagnosis not present

## 2023-01-12 NOTE — Assessment & Plan Note (Signed)
Chronic, stable.  BP well below goal in office.  Continue current medication regimen and collaboration with nephrology.  Labs: up to date.  Urine ALB 150 January 2024.  Recommend she continue to monitor BP at home daily and document for providers + focus on DASH diet.  Continue collaboration with cardiology and nephrology.  Recommend reducing Olmesartan dosing due to lower BP level and concern for orthostatic levels.  She does not wish to adjust today.

## 2023-01-12 NOTE — Assessment & Plan Note (Signed)
Chronic, ongoing.  Continue collaboration with hematology and infusions.

## 2023-01-12 NOTE — Assessment & Plan Note (Signed)
Acute and improved at this time, continue to collaborate with surgery and hematology.

## 2023-01-12 NOTE — Progress Notes (Signed)
BP 102/70   Pulse 74   Temp 97.8 F (36.6 C) (Oral)   Ht 5' 7.01" (1.702 m)   Wt 142 lb 3.2 oz (64.5 kg)   LMP  (LMP Unknown)   SpO2 95%   BMI 22.27 kg/m    Subjective:    Patient ID: Tonya [name], female    DOB: 1946-11-26, 77 y.o.   MRN: [id number]  HPI: Tonya [name] is a 77 y.o. female  Chief Complaint  Patient presents with   Hypertension   HYPERTENSION  Continues on Carvedilol, Olmesartan, and Torsemide. Follows with cardiology, last visit 07/01/22.  Still has occasional dizzy spells, sugar is not low at those times -- notices more with movement/position changes.  Does not wear compression hose at home.    Saw endo for diabetes follow-up on 01/02/23 with A1c 7% recent check.  No changes made at their visit.  She would like to return to see Dr. Sabra Heck for her ongoing back pain. Hypertension status: stable  Satisfied with current treatment? yes Duration of hypertension: chronic BP monitoring frequency:  rarely BP range:  BP medication side effects:  no Medication compliance: good compliance Aspirin: no Recurrent headaches: no Visual changes: no Palpitations: no Dyspnea: no Chest pain: no Lower extremity edema: occasional Dizzy/lightheaded: no   ANEMIA Saw hematology on 12/25/22 and iron infusion obtained.  Recent GI bleed. Anemia status: stable Etiology of anemia: chronic Duration of anemia treatment:  Compliance with treatment: good compliance Iron supplementation side effects: no Severity of anemia: moderate Fatigue: no Decreased exercise tolerance: no  Dyspnea on exertion: no Palpitations: no Bleeding: no Pica: no   Relevant past medical, surgical, family and social history reviewed and updated as indicated. Interim medical history since our last visit reviewed. Allergies and medications reviewed and updated.  Review of Systems  Constitutional:  Negative for activity change, appetite change, diaphoresis, fatigue and fever.  Respiratory:   Negative for cough, chest tightness, shortness of breath and wheezing.   Cardiovascular:  Negative for chest pain, palpitations and leg swelling.  Gastrointestinal: Negative.   Neurological: Negative.   Psychiatric/Behavioral: Negative.      Per HPI unless specifically indicated above     Objective:    BP 102/70   Pulse 74   Temp 97.8 F (36.6 C) (Oral)   Ht 5' 7.01" (1.702 m)   Wt 142 lb 3.2 oz (64.5 kg)   LMP  (LMP Unknown)   SpO2 95%   BMI 22.27 kg/m   Wt Readings from Last 3 Encounters:  01/12/23 142 lb 3.2 oz (64.5 kg)  12/25/22 144 lb 4.8 oz (65.5 kg)  12/22/22 147 lb 6.4 oz (66.9 kg)    Physical Exam Vitals and nursing note reviewed.  Constitutional:      General: She is awake. She is not in acute distress.    Appearance: She is well-developed. She is not ill-appearing.  HENT:     Head: Normocephalic.     Right Ear: Hearing normal.     Left Ear: Hearing normal.  Eyes:     General: Lids are normal.        Right eye: No discharge.        Left eye: No discharge.     Conjunctiva/sclera: Conjunctivae normal.     Pupils: Pupils are equal, round, and reactive to light.  Neck:     Thyroid: No thyromegaly.     Vascular: No carotid bruit.  Cardiovascular:     Rate and Rhythm: Normal  rate and regular rhythm.     Heart sounds: Normal heart sounds. No murmur heard.    No gallop.  Pulmonary:     Effort: Pulmonary effort is normal. No accessory muscle usage or respiratory distress.     Breath sounds: Normal breath sounds.  Abdominal:     General: Bowel sounds are normal.     Palpations: Abdomen is soft.  Musculoskeletal:     Cervical back: Normal range of motion and neck supple.     Right lower leg: Edema (trace) present.     Left lower leg: Edema (trace) present.  Skin:    General: Skin is warm and dry.     Findings: No rash.  Neurological:     Mental Status: She is alert and oriented to person, place, and time.  Psychiatric:        Attention and Perception:  Attention normal.        Mood and Affect: Mood normal.        Behavior: Behavior normal. Behavior is cooperative.        Thought Content: Thought content normal.        Judgment: Judgment normal.     Results for orders placed or performed in visit on 12/22/22  Microalbumin, Urine Waived  Result Value Ref Range   Microalb, Ur Waived 150 (H) 0 - 19 mg/L   Creatinine, Urine Waived 50 10 - 300 mg/dL   Microalb/Creat Ratio >300 (H) <30 mg/g  Comprehensive metabolic panel  Result Value Ref Range   Glucose 217 (H) 70 - 99 mg/dL   BUN 35 (H) 8 - 27 mg/dL   Creatinine, Ser 2.17 (H) 0.57 - 1.00 mg/dL   eGFR 23 (L) >59 mL/min/1.73   BUN/Creatinine Ratio 16 12 - 28   Sodium 143 134 - 144 mmol/L   Potassium 5.0 3.5 - 5.2 mmol/L   Chloride 110 (H) 96 - 106 mmol/L   CO2 21 20 - 29 mmol/L   Calcium 8.9 8.7 - 10.3 mg/dL   Total Protein 5.5 (L) 6.0 - 8.5 g/dL   Albumin 3.5 (L) 3.8 - 4.8 g/dL   Globulin, Total 2.0 1.5 - 4.5 g/dL   Albumin/Globulin Ratio 1.8 1.2 - 2.2   Bilirubin Total <0.2 0.0 - 1.2 mg/dL   Alkaline Phosphatase 62 44 - 121 IU/L   AST 14 0 - 40 IU/L   ALT 7 0 - 32 IU/L  CBC with Differential/Platelet  Result Value Ref Range   WBC 6.8 3.4 - 10.8 x10E3/uL   RBC 3.07 (L) 3.77 - 5.28 x10E6/uL   Hemoglobin 8.6 (L) 11.1 - 15.9 g/dL   Hematocrit 27.3 (L) 34.0 - 46.6 %   MCV 89 79 - 97 fL   MCH 28.0 26.6 - 33.0 pg   MCHC 31.5 31.5 - 35.7 g/dL   RDW 14.0 11.7 - 15.4 %   Platelets 284 150 - 450 x10E3/uL   Neutrophils 73 Not Estab. %   Lymphs 19 Not Estab. %   Monocytes 6 Not Estab. %   Eos 2 Not Estab. %   Basos 0 Not Estab. %   Neutrophils Absolute 4.9 1.4 - 7.0 x10E3/uL   Lymphocytes Absolute 1.3 0.7 - 3.1 x10E3/uL   Monocytes Absolute 0.4 0.1 - 0.9 x10E3/uL   EOS (ABSOLUTE) 0.2 0.0 - 0.4 x10E3/uL   Basophils Absolute 0.0 0.0 - 0.2 x10E3/uL   Immature Granulocytes 0 Not Estab. %   Immature Grans (Abs) 0.0 0.0 - 0.1 x10E3/uL  Lipid Panel  w/o Chol/HDL Ratio  Result Value  Ref Range   Cholesterol, Total 184 100 - 199 mg/dL   Triglycerides 94 0 - 149 mg/dL   HDL 87 >39 mg/dL   VLDL Cholesterol Cal 17 5 - 40 mg/dL   LDL Chol Calc (NIH) 80 0 - 99 mg/dL  TSH  Result Value Ref Range   TSH 2.220 0.450 - 4.500 uIU/mL  Vitamin B12  Result Value Ref Range   Vitamin B-12 486 232 - 1,245 pg/mL  Iron Binding Cap (TIBC)([facility])  Result Value Ref Range   Total Iron Binding Capacity 272 250 - 450 ug/dL   UIBC 195 118 - 369 ug/dL   Iron 77 27 - 139 ug/dL   Iron Saturation 28 15 - 55 %  Ferritin  Result Value Ref Range   Ferritin 251 (H) 15 - 150 ng/mL  VITAMIN D 25 Hydroxy (Vit-D Deficiency, Fractures)  Result Value Ref Range   Vit D, 25-Hydroxy 22.7 (L) 30.0 - 100.0 ng/mL      Assessment & Plan:   Problem List Items Addressed This Visit       Cardiovascular and Mediastinum   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  BP well below goal in office.  Continue current medication regimen and collaboration with nephrology.  Labs: up to date.  Urine ALB 150 January 2024.  Recommend she continue to monitor BP at home daily and document for providers + focus on DASH diet.  Continue collaboration with cardiology and nephrology.  Recommend reducing Olmesartan dosing due to lower BP level and concern for orthostatic levels.  She does not wish to adjust today.        Musculoskeletal and Integument   Lumbar degenerative disc disease    Referral to ortho requested and placed.      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  Continue collaboration with hematology and infusions.          Other   History of GI bleed    Acute and improved at this time, continue to collaborate with surgery and hematology.      Sacroiliac joint pain    Referral to ortho requested and placed.      Relevant Orders    Ambulatory referral to Orthopedics     Follow up plan: Return in about 5 months (around 06/13/2023) for T2DM, HTN/HLD, ANEMIA, HF, CKD.

## 2023-01-12 NOTE — Assessment & Plan Note (Signed)
Referral to ortho requested and placed.

## 2023-01-16 ENCOUNTER — Telehealth: Payer: Self-pay

## 2023-01-16 DIAGNOSIS — M545 Low back pain, unspecified: Secondary | ICD-10-CM | POA: Diagnosis not present

## 2023-01-16 NOTE — Progress Notes (Cosign Needed)
Care Management & Coordination Services Pharmacy Team  Reason for Encounter: General adherence update   Contacted patient for general health update and medication adherence call.  Unsuccessful outreach. Left voicemail for patient to return call.   Recent office visits:  01/12/23-[name] T. Ned Card, NP (PCP) Seen for hypertension. Ambulatory referral to Orthopedic for lumbar degenerative disc disease. Follow up in 5 months.  12/22/22-[name] T. Ned Card, NP (PCP) Seen for hospital follow up visit. Labs ordered. Fly vaccine given. Follow up in 3 weeks.  Recent consult visits:  01/02/23-Elena Haynes Kerns, PA (Endocrinology) General follow up visit. Follow up in 3 months.  12/29/22-Bilal Holley Raring, MD (Pain medicine)  12/25/22-[name] [name], MD (Oncology) Follow-up for anemia due to chronic kidney disease. Labs ordered. 10/29/22-Munsoor Holley Raring, MD (Nephrology) Seen for a follow up visit. Follow up in 10 weeks. 10/23/22-Bilal Holley Raring, MD (Pain medicine) Seen for evalutation and management of her Lumbar feet. Follow up in 4 weeks.  Hospital visits:  None in previous 6 months  Medications: Outpatient Encounter Medications as of 01/16/2023  Medication Sig   acetaminophen (TYLENOL) 650 MG CR tablet Take 650 mg by mouth every 8 (eight) hours as needed for pain.   aspirin 81 MG tablet Take 81 mg by mouth daily.   carvedilol (COREG) 3.125 MG tablet TAKE 1 TABLET BY MOUTH TWICE  DAILY WITH MEALS   docusate sodium (COLACE) 100 MG capsule Take 100 mg by mouth daily as needed for mild constipation.   insulin lispro (HUMALOG) 100 UNIT/ML injection Inject 8 Units into the skin 3 (three) times daily before meals.   meclizine (ANTIVERT) 12.5 MG tablet Take 12.5 mg by mouth 3 (three) times daily.   methocarbamol (ROBAXIN) 500 MG tablet Take 1 tablet (500 mg total) by mouth daily as needed for muscle spasms.   naloxone (NARCAN) nasal spray 4 mg/0.1 mL SMARTSIG:Both Nares   NOVOFINE 32G X 6 MM MISC USE  AS DIRECTED THREE TIMES DAILY WITH HUMALOG   olmesartan (BENICAR) 40 MG tablet Take 1 tablet (40 mg total) by mouth daily. (Patient taking differently: Take 40 mg by mouth every morning.)   pantoprazole (PROTONIX) 40 MG tablet Take 1 tablet (40 mg total) by mouth daily.   pioglitazone (ACTOS) 30 MG tablet Take 30 mg by mouth daily.   rosuvastatin (CRESTOR) 5 MG tablet Take 1 tablet (5 mg total) by mouth daily. (Patient taking differently: Take 5 mg by mouth every morning.)   torsemide (DEMADEX) 20 MG tablet Take 2 tablets (40 mg total) by mouth daily. (Patient taking differently: Take 40 mg by mouth every morning.)   vitamin B-12 (CYANOCOBALAMIN) 100 MCG tablet Take 100 mcg by mouth daily.   Vitamin D, Cholecalciferol, 50 MCG (2000 UT) CAPS Take 2,000 Units by mouth daily.   No facility-administered encounter medications on file as of 01/16/2023.    Recent vitals BP Readings from Last 3 Encounters:  01/12/23 102/70  12/25/22 (!) 149/79  12/22/22 120/68   Pulse Readings from Last 3 Encounters:  01/12/23 74  12/25/22 97  12/22/22 87   Wt Readings from Last 3 Encounters:  01/12/23 142 lb 3.2 oz (64.5 kg)  12/25/22 144 lb 4.8 oz (65.5 kg)  12/22/22 147 lb 6.4 oz (66.9 kg)   BMI Readings from Last 3 Encounters:  01/12/23 22.27 kg/m  12/25/22 22.60 kg/m  12/22/22 23.08 kg/m    Recent lab results    Component Value Date/Time   NA 143 12/22/2022 1600   K 5.0 12/22/2022 1600   CL 110 (  H) 12/22/2022 1600   CO2 21 12/22/2022 1600   GLUCOSE 217 (H) 12/22/2022 1600   GLUCOSE 77 08/27/2022 0442   BUN 35 (H) 12/22/2022 1600   CREATININE 2.17 (H) 12/22/2022 1600   CALCIUM 8.9 12/22/2022 1600    Lab Results  Component Value Date   CREATININE 2.17 (H) 12/22/2022   EGFR 23 (L) 12/22/2022   GFRNONAA 18 (L) 08/27/2022   GFRAA 20 (L) 01/29/2021   Lab Results  Component Value Date/Time   HGBA1C 8.1 (H) 08/26/2022 04:13 PM   HGBA1C 8.2 (H) 01/29/2021 11:05 AM   HGBA1C 7.9 (H)  10/25/2020 11:24 AM   HGBA1C 7.6 07/09/2020 12:00 AM   HGBA1C 8.5 03/28/2020 12:00 AM   MICROALBUR 150 (H) 12/22/2022 03:58 PM   MICROALBUR 150 (H) 01/29/2021 11:05 AM    Lab Results  Component Value Date   CHOL 184 12/22/2022   HDL 87 12/22/2022   LDLCALC 80 12/22/2022   LDLDIRECT 67 12/30/2018   TRIG 94 12/22/2022   CHOLHDL 2.5 12/02/2019   Care Gaps: Annual wellness visit in last year? No  Star Rating Drugs:  Olmesartan 40 mg Last filled:01/23/23 100 DS, 10/30/22 90 DS Rosuvastatin 5 mg Last filled:01/23/23 100 DS, 10/30/22 90 DS  Myriam Elta Guadeloupe, RMA

## 2023-01-19 ENCOUNTER — Ambulatory Visit: Payer: Medicare Other | Admitting: Surgery

## 2023-01-19 ENCOUNTER — Encounter: Payer: Self-pay | Admitting: Surgery

## 2023-01-19 ENCOUNTER — Other Ambulatory Visit: Payer: Self-pay

## 2023-01-19 VITALS — BP 164/82 | HR 86 | Temp 98.3°F | Ht 67.0 in | Wt 140.0 lb

## 2023-01-19 DIAGNOSIS — K449 Diaphragmatic hernia without obstruction or gangrene: Secondary | ICD-10-CM

## 2023-01-19 NOTE — Patient Instructions (Signed)
Patient will call back if she decides to have CT Abdomen/Pelvis done so we can compare the images from [facility] and [facility].

## 2023-01-20 ENCOUNTER — Telehealth: Payer: Self-pay

## 2023-01-20 DIAGNOSIS — Z09 Encounter for follow-up examination after completed treatment for conditions other than malignant neoplasm: Secondary | ICD-10-CM

## 2023-01-20 NOTE — Telephone Encounter (Signed)
Left message for patient to return call for the information listed below.  CT Abdomen/Pelvis scheduled 01/27/23 @ 7:30 am @ [facility].   Dr.Pabon 01/28/23 @ 11:15.

## 2023-01-21 DIAGNOSIS — M545 Low back pain, unspecified: Secondary | ICD-10-CM | POA: Diagnosis not present

## 2023-01-22 ENCOUNTER — Encounter: Payer: Self-pay | Admitting: Oncology

## 2023-01-22 NOTE — Progress Notes (Signed)
Outpatient Surgical Follow Up  01/22/2023  Tonya [name] is an 77 y.o. female.   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  She Went to the outside hospital endoscopy personally reviewed apparently showing some slipped Nissen.  There is no evidence recent CT scan to evaluate further anatomy.  She did have Cameron ulcers as well GI bleed that required transfusion and hospitalization.  Some reason she thought she had a esophageal injury.  I was very honest with her and clarify this.  She did not have evidence of an esophageal injury and she did have evidence of Cameron ulcers likely causing her GI bleed.  EGD also noted potential slipped Nissen.  I have personally reviewed the images, given the pictures it is impossible for me to determine whether or not the Nissen wrap has slipped.  No other imaging studies available Does report some reflux. He was on Plavix and aspirin.  ET from outside facility I do not have the imaging studies but I do have the report saying that there is mural thickening of the herniated stomach.  No evidence of esophageal injuries.  Past Medical History:  Diagnosis Date   (HFpEF) heart failure with preserved ejection fraction ([name])    a.) TTE 05/29/2017: EF 55-60%, mild LAE, G1DD; b.) TTE 09/07/2017: EF >55%, mild LAE, Ao sclerosis without stenosis, degen MV disease, G2DD; c.) TTE 12/02/2018: EF 55-60%, mild LVH, Mild BAE, triv TR, G1DD; d.) TTE 11/22/2019: EF 60-65%, mod LVH, mild TR, G1DD   Anemia in stage 4 chronic kidney disease (HCC)    Arthritis    Cardiomyopathy ([name])    CKD (chronic kidney disease) stage 4, GFR 15-29 ml/min (HCC)    CVA (cerebral vascular accident) ([name]) 08/2017   a.) no residual deficits   Diabetic neuropathy (HCC)    Diverticulitis    GERD (gastroesophageal reflux disease)    History of hiatal hernia     Hyperlipidemia    Hypertension    Long term current use of antithrombotics/antiplatelets    a.) DAPT therapy (ASA + clopidogrel)   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 ([name])    Paraesophageal hernia    PHN (postherpetic neuralgia)    Sarcoidosis    Shingles    Type 2 diabetes mellitus treated with insulin ([name])    Wears dentures    partial upper    Past Surgical History:  Procedure Laterality Date   CATARACT EXTRACTION W/PHACO Left 02/05/2021   Procedure: CATARACT EXTRACTION PHACO AND INTRAOCULAR LENS PLACEMENT ([place]) LEFT DIABETIC 5.47 00:51.4;  Surgeon: Birder Robson, MD;  Location: [place];  Service: Ophthalmology;  Laterality: Left;  Diabetic - insulin and oral meds   CATARACT EXTRACTION W/PHACO Right 03/19/2021   Procedure: CATARACT EXTRACTION PHACO AND INTRAOCULAR LENS PLACEMENT ([facility]) RIGHT DIABETIC 6.09 00:39.6;  Surgeon: Birder Robson, MD;  Location: [place];  Service: Ophthalmology;  Laterality: Right;   COLONOSCOPY  12/15/2006   COLONOSCOPY WITH PROPOFOL N/A 06/06/2019   Procedure: COLONOSCOPY WITH PROPOFOL;  Surgeon: [name], MD;  Location: [facility] END[facility]COPY;  Service: Gastroenterology;  Laterality: N/A;   COLONOSCOPY WITH PROPOFOL N/A 01/16/2020   Procedure: COLONOSCOPY WITH BIOPSY;  Surgeon: [name], MD;  Location: [place];  Service: Endoscopy;  Laterality: N/A;  Diabetic - insulin and oral meds   ESOPHAGOGASTRODUODENOSCOPY (EGD) WITH PROPOFOL N/A 06/23/2022  Procedure: ESOPHAGOGASTRODUODENOSCOPY (EGD) WITH PROPOFOL;  Surgeon: [name], MD;  Location: [facility] ENDOSCOPY;  Service: Gastroenterology;  Laterality: N/A;   EYE SURGERY     INSERTION OF MESH  08/26/2022   Procedure: INSERTION OF MESH;  Surgeon: [name], MD;  Location: [facility];  Service: General;;   POLYPECTOMY N/A 01/16/2020   Procedure: POLYPECTOMY;  Surgeon: [name], MD;  Location: [place];  Service: Endoscopy;   Laterality: N/A;   TUBAL LIGATION     XI ROBOTIC ASSISTED PARAESOPHAGEAL HERNIA REPAIR N/A 08/26/2022   Procedure: XI ROBOTIC ASSISTED PARAESOPHAGEAL HERNIA REPAIR, RNFA to assist;  Surgeon: [name], MD;  Location: [facility];  Service: General;  Laterality: N/A;    Family History  Problem Relation Age of Onset   Diabetes Mother    Stroke Mother    Hyperlipidemia Father    Hypertension Father    Diabetes Sister    Diabetes Brother    Gout Son    Diabetes Brother    Kidney disease Son    Lymphoma Sister    Heart attack Sister    Breast cancer Neg Hx     Social History:  reports that she has never smoked. She has never used smokeless tobacco. She reports that she does not drink alcohol and does not use drugs.  Allergies:  Allergies  Allergen Reactions   Atorvastatin     Myalgias    Gabapentin Other (See Comments)    Speech impairment    Pravastatin     Myalgias    Pregabalin     Speech impairment   Trulicity [Dulaglutide] Hives    Medications reviewed.    ROS Full ROS performed and is otherwise negative other than what is stated in HPI   BP (!) 164/82   Pulse 86   Temp 98.3 F (36.8 C) (Oral)   Ht '5\' 7"'$  (1.702 m)   Wt 140 lb (63.5 kg)   LMP  (LMP Unknown)   SpO2 100%   BMI 21.93 kg/m   Physical Exam CONSTITUTIONAL: NAD. EYES: Pupils are equal, round, Sclera are non-icteric. EARS, NOSE, MOUTH AND THROAT: The oral mucosa is pink and moist. Hearing is intact to voice. LYMPH NODES:  Lymph nodes in the neck are normal. RESPIRATORY:  Lungs are clear. There is normal respiratory effort, with equal breath sounds bilaterally, and without pathologic use of accessory muscles. CARDIOVASCULAR: Heart is regular without murmurs, gallops, or rubs. GI: The abdomen is  soft, nontender, and nondistended. There are no palpable masses. There is no hepatosplenomegaly. There are normal bowel sounds in all quadrants. GU: Rectal deferred.   MUSCULOSKELETAL: Normal muscle  strength and tone. No cyanosis or edema.   SKIN: Turgor is good and there are no pathologic skin lesions or ulcers. NEUROLOGIC: Motor and sensation is grossly normal. Cranial nerves are grossly intact. PSYCH:  Oriented to person, place and time. Affect is normal.  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  Went to the outside hospital endoscopy personally reviewed apparently showing some slipped Nissen.  There is no evidence recent CT scan to evaluate further anatomy.  She did have Cameron ulcers as well GI bleed that required transfusion and hospitalization. Definitely recommend further imaging with a CT scan of the abdomen pelvis that will help [place] delineate the anatomy and whether or not she has a true recurrence. Was very candid with them that her hernia was very large and the chance  of recurrence were significant.  I also clarified with her that she did not have any esophageal transection or any injuries. She also wants a second opinion at [facility] which will happen within the next couple weeks.  I did encourage her to do that Spent 40 minutes today's encounter including personally reviewing imaging studies, medical records, counseling the patient and coordinating her care  Caroleen Hamman, MD [facility] General Surgeon

## 2023-01-24 ENCOUNTER — Encounter: Payer: Self-pay | Admitting: Oncology

## 2023-01-26 DIAGNOSIS — E1122 Type 2 diabetes mellitus with diabetic chronic kidney disease: Secondary | ICD-10-CM | POA: Diagnosis not present

## 2023-01-26 DIAGNOSIS — N2581 Secondary hyperparathyroidism of renal origin: Secondary | ICD-10-CM | POA: Diagnosis not present

## 2023-01-26 DIAGNOSIS — D631 Anemia in chronic kidney disease: Secondary | ICD-10-CM | POA: Diagnosis not present

## 2023-01-26 DIAGNOSIS — N184 Chronic kidney disease, stage 4 (severe): Secondary | ICD-10-CM | POA: Diagnosis not present

## 2023-01-26 DIAGNOSIS — I10 Essential (primary) hypertension: Secondary | ICD-10-CM | POA: Diagnosis not present

## 2023-01-27 ENCOUNTER — Other Ambulatory Visit: Payer: Self-pay | Admitting: Surgery

## 2023-01-27 ENCOUNTER — Ambulatory Visit
Admission: RE | Admit: 2023-01-27 | Discharge: 2023-01-27 | Disposition: A | Discharge status: Home / Self Care | Payer: Medicare Other | Source: Ambulatory Visit | Attending: Surgery | Admitting: Surgery

## 2023-01-27 DIAGNOSIS — K449 Diaphragmatic hernia without obstruction or gangrene: Secondary | ICD-10-CM | POA: Diagnosis not present

## 2023-01-27 DIAGNOSIS — N200 Calculus of kidney: Secondary | ICD-10-CM | POA: Diagnosis not present

## 2023-01-27 DIAGNOSIS — Z09 Encounter for follow-up examination after completed treatment for conditions other than malignant neoplasm: Secondary | ICD-10-CM

## 2023-01-27 DIAGNOSIS — K5730 Diverticulosis of large intestine without perforation or abscess without bleeding: Secondary | ICD-10-CM | POA: Insufficient documentation

## 2023-01-28 ENCOUNTER — Ambulatory Visit: Payer: Medicare Other | Admitting: Surgery

## 2023-01-28 ENCOUNTER — Encounter: Payer: Self-pay | Admitting: Surgery

## 2023-01-28 VITALS — BP 150/80 | HR 76 | Temp 98.0°F | Ht 67.0 in | Wt 140.0 lb

## 2023-01-28 DIAGNOSIS — K449 Diaphragmatic hernia without obstruction or gangrene: Secondary | ICD-10-CM

## 2023-01-28 DIAGNOSIS — K250 Acute gastric ulcer with hemorrhage: Secondary | ICD-10-CM

## 2023-01-28 LAB — POCT I-STAT CREATININE: Creatinine, Ser: 2.8 mg/dL — ABNORMAL HIGH (ref 0.44–1.00)

## 2023-01-28 MED ORDER — PANTOPRAZOLE SODIUM 40 MG PO TBEC: 40.0000 mg | DELAYED_RELEASE_TABLET | Freq: Every day | ORAL | 3 refills | Status: DC

## 2023-01-28 NOTE — Patient Instructions (Signed)
Pick up your medication at the pharmacy. Please see your follow up appointment listed below.

## 2023-01-30 DIAGNOSIS — M5416 Radiculopathy, lumbar region: Secondary | ICD-10-CM | POA: Diagnosis not present

## 2023-01-30 NOTE — Progress Notes (Signed)
Outpatient Surgical Follow Up  01/30/2023  Tonya [name] is an 77 y.o. female.   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  She Went to the outside hospital endoscopy personally reviewed apparently showing some slipped Nissen.  There is no evidence recent CT scan to evaluate further anatomy.  She did have Cameron ulcers as well GI bleed that required transfusion and hospitalization She is doing well from foregut perspective, no dysphagia, no more gi bleeds. Repeated the CT scan that I personally reviewed showing some currents and migration of the wrap into the thoracic cavity.  No major complications related to this.  Past Medical History:  Diagnosis Date   (HFpEF) heart failure with preserved ejection fraction ([place])    a.) TTE 05/29/2017: EF 55-60%, mild LAE, G1DD; b.) TTE 09/07/2017: EF >55%, mild LAE, Ao sclerosis without stenosis, degen MV disease, G2DD; c.) TTE 12/02/2018: EF 55-60%, mild LVH, Mild BAE, triv TR, G1DD; d.) TTE 11/22/2019: EF 60-65%, mod LVH, mild TR, G1DD   Anemia in stage 4 chronic kidney disease (HCC)    Arthritis    Cardiomyopathy ([place])    CKD (chronic kidney disease) stage 4, GFR 15-29 ml/min (HCC)    CVA (cerebral vascular accident) ([place]) 08/2017   a.) no residual deficits   Diabetic neuropathy (HCC)    Diverticulitis    GERD (gastroesophageal reflux disease)    History of hiatal hernia    Hyperlipidemia    Hypertension    Long term current use of antithrombotics/antiplatelets    a.) DAPT therapy (ASA + clopidogrel)   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 ([place])    Paraesophageal hernia    PHN (postherpetic neuralgia)    Sarcoidosis    Shingles    Type 2 diabetes mellitus treated with insulin ([place])    Wears dentures    partial upper    Past Surgical History:  Procedure Laterality Date   CATARACT  EXTRACTION W/PHACO Left 02/05/2021   Procedure: CATARACT EXTRACTION PHACO AND INTRAOCULAR LENS PLACEMENT ([place]) LEFT DIABETIC 5.47 00:51.4;  Surgeon: Birder Robson, MD;  Location: [place];  Service: Ophthalmology;  Laterality: Left;  Diabetic - insulin and oral meds   CATARACT EXTRACTION W/PHACO Right 03/19/2021   Procedure: CATARACT EXTRACTION PHACO AND INTRAOCULAR LENS PLACEMENT ([facility]) RIGHT DIABETIC 6.09 00:39.6;  Surgeon: Birder Robson, MD;  Location: [place];  Service: Ophthalmology;  Laterality: Right;   COLONOSCOPY  12/15/2006   COLONOSCOPY WITH PROPOFOL N/A 06/06/2019   Procedure: COLONOSCOPY WITH PROPOFOL;  Surgeon: [name], MD;  Location: [facility] ENDOSCOPY;  Service: Gastroenterology;  Laterality: N/A;   COLONOSCOPY WITH PROPOFOL N/A 01/16/2020   Procedure: COLONOSCOPY WITH BIOPSY;  Surgeon: [name], MD;  Location: [place];  Service: Endoscopy;  Laterality: N/A;  Diabetic - insulin and oral meds   ESOPHAGOGASTRODUODENOSCOPY (EGD) WITH PROPOFOL N/A 06/23/2022   Procedure: ESOPHAGOGASTRODUODENOSCOPY (EGD) WITH PROPOFOL;  Surgeon: [name], MD;  Location: [facility] ENDOSCOPY;  Service: Gastroenterology;  Laterality: N/A;   EYE SURGERY     INSERTION OF MESH  08/26/2022   Procedure: INSERTION OF MESH;  Surgeon: [name], MD;  Location: [facility];  Service: General;;   POLYPECTOMY N/A 01/16/2020   Procedure: POLYPECTOMY;  Surgeon: [name], MD;  Location: [place];  Service: Endoscopy;  Laterality: N/A;   TUBAL LIGATION     XI ROBOTIC ASSISTED  PARAESOPHAGEAL HERNIA REPAIR N/A 08/26/2022   Procedure: XI ROBOTIC ASSISTED PARAESOPHAGEAL HERNIA REPAIR, RNFA to assist;  Surgeon: [name], MD;  Location: [facility];  Service: General;  Laterality: N/A;    Family History  Problem Relation Age of Onset   Diabetes Mother    Stroke Mother    Hyperlipidemia Father    Hypertension Father    Diabetes Sister    Diabetes Brother     Gout Son    Diabetes Brother    Kidney disease Son    Lymphoma Sister    Heart attack Sister    Breast cancer Neg Hx     Social History:  reports that she has never smoked. She has never been exposed to tobacco smoke. She has never used smokeless tobacco. She reports that she does not drink alcohol and does not use drugs.  Allergies:  Allergies  Allergen Reactions   Atorvastatin     Myalgias    Gabapentin Other (See Comments)    Speech impairment    Pravastatin     Myalgias    Pregabalin     Speech impairment   Trulicity [Dulaglutide] Hives    Medications reviewed.    ROS Full ROS performed and is otherwise negative other than what is stated in HPI   BP (!) 150/80   Pulse 76   Temp 98 F (36.7 C)   Ht 5' 7"$  (1.702 m)   Wt 140 lb (63.5 kg)   LMP  (LMP Unknown)   SpO2 100%   BMI 21.93 kg/m   Physical Exam YES: Pupils are equal, round, Sclera are non-icteric. EARS, NOSE, MOUTH AND THROAT: The oral mucosa is pink and moist. Hearing is intact to voice. LYMPH NODES:  Lymph nodes in the neck are normal. RESPIRATORY:  Lungs are clear. There is normal respiratory effort, with equal breath sounds bilaterally, and without pathologic use of accessory muscles. CARDIOVASCULAR: Heart is regular without murmurs, gallops, or rubs. GI: The abdomen is  soft, nontender, and nondistended. There are no palpable masses. There is no hepatosplenomegaly. There are normal bowel sounds in all quadrants. GU: Rectal deferred.   MUSCULOSKELETAL: Normal muscle strength and tone. No cyanosis or edema.   SKIN: Turgor is good and there are no pathologic skin lesions or ulcers. NEUROLOGIC: Motor and sensation is grossly normal. Cranial nerves are grossly intact. PSYCH:  Oriented to person, place and time. Affect is normal   Assessment/Plan: 77 year old female with radiographical recurrence of paraesophageal hernia.  She has significant comorbidities and I do think doing a redo via hernia at  this time will have more potential risks than benefits.  She has no significant symptoms .  Will definitely recommend continuation of PPI given that she had ulcers in the stomach that created a GI bleed. RTC 3 months  Please note I spent 30 min in this encounter including personally reviewing imaging studies, counseling the patient and performing appropriate documentation  Caroleen Hamman, MD [place] Surgeon

## 2023-02-02 ENCOUNTER — Telehealth: Payer: Self-pay | Admitting: Nurse Practitioner

## 2023-02-02 NOTE — Telephone Encounter (Deleted)
.  AW

## 2023-02-02 NOTE — Telephone Encounter (Signed)
Contacted [name] to schedule their annual wellness visit. Appointment made for 02/17/2023.  [facility] Direct Dial: [phone]

## 2023-02-05 ENCOUNTER — Telehealth: Payer: Self-pay

## 2023-02-05 ENCOUNTER — Inpatient Hospital Stay: Payer: Medicare Other | Attending: Oncology

## 2023-02-05 ENCOUNTER — Other Ambulatory Visit: Payer: Self-pay

## 2023-02-05 ENCOUNTER — Inpatient Hospital Stay: Payer: Medicare Other

## 2023-02-05 NOTE — Telephone Encounter (Signed)
Copied from [place] [id number]. Topic: General - Inquiry >> Feb 05, 2023 11:35 AM Tonya [name] wrote: Reason for CRM: pt called asking if she could get a prescription for gabapentin.  She use to take it but she is asking for a lower dose than she use to take,  She said she is having nerve pain and when she took it before it gave her a speech impairment.   [phone]

## 2023-02-06 ENCOUNTER — Ambulatory Visit: Payer: Medicare Other | Admitting: Nurse Practitioner

## 2023-02-06 DIAGNOSIS — Z794 Long term (current) use of insulin: Secondary | ICD-10-CM | POA: Diagnosis not present

## 2023-02-06 DIAGNOSIS — E1165 Type 2 diabetes mellitus with hyperglycemia: Secondary | ICD-10-CM | POA: Diagnosis not present

## 2023-02-06 NOTE — Telephone Encounter (Signed)
Pt has returned call. Pt has been scheduled with [name] for next available appt.

## 2023-02-07 ENCOUNTER — Encounter: Payer: Self-pay | Admitting: Oncology

## 2023-02-10 ENCOUNTER — Telehealth: Payer: Medicare Other

## 2023-02-10 ENCOUNTER — Telehealth: Payer: Self-pay

## 2023-02-10 NOTE — Telephone Encounter (Signed)
   CCM RN Visit Note   02-10-2023 Name: Tonya [name] MRN: [id number]      DOB: 1945-12-23  Subjective: Tonya [name] is a 77 y.o. year old female who is a primary care patient of [name], NP The patient was referred to the Chronic Care Management team for assistance with care management needs subsequent to provider initiation of CCM services and plan of care.      An unsuccessful telephone outreach was attempted today to contact the patient about Chronic Care Management needs.    Plan:A HIPAA compliant phone message was left for the patient providing contact information and requesting a return call.  [name] RN, MSN, CCM RN Care Manager  Chronic Care Management Direct Number: [phone]

## 2023-02-14 NOTE — Patient Instructions (Signed)
Postherpetic Neuralgia Postherpetic neuralgia (PHN) is nerve pain that occurs after a shingles infection. Shingles is a painful rash that appears on one area of the body, usually on the trunk or face. Shingles is caused by the varicella-zoster virus. This is the same virus that causes chickenpox. In people who have had chickenpox, the virus can resurface years later and cause shingles. PHN appears in the same area where you had the shingles rash. The pain usually goes away after the rash disappears. You may have PHN if you continue to have pain 3 months after your shingles rash has gone away. What are the causes? This condition is caused by damage to your nerves due to inflammation from the varicella-zoster virus. The damage makes your nerves overly sensitive. What increases the risk? The following factors may make you more likely to develop this condition: Being older than 75 years of age. Having severe pain before your shingles rash starts. Having a severe rash. Having shingles in and around the eye area. Having a disease or taking medicine that causes you to have a weakened disease-fighting system (immune system). What are the signs or symptoms? The main symptom of this condition is pain. The pain may: Often be severe and may be described as stabbing, burning, shooting, or feeling like an electric shock. Come and go, or it may be there all the time. Be triggered by light touches on the skin or changes in temperature. You may have itching along with the pain. How is this diagnosed? This condition may be diagnosed based on your symptoms and your history of shingles. Lab studies and other diagnostic tests are usually not needed. How is this treated? There is no cure for this condition. Treatment for PHN will focus on pain relief. Over-the-counter pain relievers do not usually relieve PHN pain. You may need to work with a pain specialist. Treatment may include: Anti-seizure medicines to relieve  nerve pain. Antidepressant medicines to help with pain and improve sleep. A numbing patch worn on the skin (lidocaine patch). Strong pain relievers (opioids). Injections of numbing medicine or anti-inflammatory medicines around irritated nerves. Injections of botulinum toxin to block pain signals between nerves and muscles. Follow these instructions at home: Medicines Take over-the-counter and prescription medicines only as told by your health care provider. Ask your health care provider if the medicine prescribed to you: Requires you to avoid driving or using machinery. Can cause constipation. You may need to take these actions to prevent or treat constipation: Drink enough fluid to keep your urine pale yellow. Take over-the-counter or prescription medicines. Eat foods that are high in fiber, such as beans, whole grains, and fresh fruits and vegetables. Limit foods that are high in fat and processed sugars, such as fried or sweet foods. Managing pain  If directed, put ice on the painful area. To do this: Put ice in a plastic bag. Place a towel between your skin and the bag. Leave the ice on for 20 minutes, 2-3 times a day. Remove the ice if your skin turns bright red. This is very important. If you cannot feel pain, heat, or cold, you have a greater risk of damage to the area. Cover sensitive areas with a bandage (dressing) to reduce friction from clothing rubbing on the area. General instructions It may take a long time to recover from PHN. Work closely with your health care provider and develop a good support system at home. You may consider joining a support group. Wear loose, comfortable clothing. Talk   to your health care provider if you feel depressed or desperate. Living with long-term pain can be depressing. Keep all follow-up visits. This is important. How is this prevented? Getting a vaccination for shingles can prevent PHN. The shingles vaccine is recommended for people older  than age 50. It may prevent shingles and may also lower your risk of PHN if you do get shingles. Contact a health care provider if: Your medicine is not helping. You are struggling to manage your pain at home. Get help right away if: You have thoughts about hurting yourself or others. If you ever feel like you may hurt yourself or others, or have thoughts about taking your own life, get help right away. Go to your nearest emergency department or: Call your local emergency services (911 in the [place] Call a suicide crisis helpline, such as the National Suicide Prevention Lifeline at 1-800-273-8255 or 988 in the [place] This is open 24 hours a day in the [place] Text the Crisis Text Line at 741741 (in the [place] Summary Postherpetic neuralgia (PHN) is a very painful disorder that can occur after an episode of shingles. The pain is often severe and may be described as stabbing, burning, shooting, or feeling like an electric shock. Prescription medicines can be helpful in managing persistent pain. Getting a vaccination for shingles can prevent PHN. This vaccine is recommended for people older than age 50. This information is not intended to replace advice given to you by your health care provider. Make sure you discuss any questions you have with your health care provider. Document Revised: 06/26/2021 Document Reviewed: 11/26/2020 Elsevier Patient Education  2023 [employer]  

## 2023-02-17 ENCOUNTER — Ambulatory Visit (INDEPENDENT_AMBULATORY_CARE_PROVIDER_SITE_OTHER): Payer: Medicare Other | Admitting: Nurse Practitioner

## 2023-02-17 ENCOUNTER — Ambulatory Visit (INDEPENDENT_AMBULATORY_CARE_PROVIDER_SITE_OTHER): Payer: Medicare Other

## 2023-02-17 ENCOUNTER — Encounter: Payer: Self-pay | Admitting: Nurse Practitioner

## 2023-02-17 VITALS — BP 138/80 | HR 78 | Temp 98.1°F | Ht 67.01 in | Wt 143.9 lb

## 2023-02-17 DIAGNOSIS — Z0000 Encounter for general adult medical examination without abnormal findings: Secondary | ICD-10-CM

## 2023-02-17 DIAGNOSIS — B0229 Other postherpetic nervous system involvement: Secondary | ICD-10-CM

## 2023-02-17 MED ORDER — GABAPENTIN 100 MG PO CAPS: 100.0000 mg | ORAL_CAPSULE | Freq: Every day | ORAL | 4 refills | Status: DC

## 2023-02-17 NOTE — Progress Notes (Signed)
Subjective:   Tonya [name] is a 77 y.o. female who presents for Medicare Annual (Subsequent) preventive examination.  Review of Systems     Cardiac Risk Factors include: advanced age (>55mn, >>44women);diabetes mellitus;dyslipidemia;hypertension;sedentary lifestyle     Objective:    There were no vitals filed for this visit. There is no height or weight on file to calculate BMI.     02/17/2023    3:01 PM 12/01/2022    8:09 AM 08/26/2022    6:00 PM 08/25/2022   10:32 AM 08/15/2022    4:48 PM 07/23/2022    1:11 PM 06/23/2022   10:25 AM  Advanced Directives  Does Patient Have a Medical Advance Directive? No No No No No No No  Would patient like information on creating a medical advance directive? No - Patient declined  No - Patient declined        Current Medications (verified) Outpatient Encounter Medications as of 02/17/2023  Medication Sig   acetaminophen (TYLENOL) 650 MG CR tablet Take 650 mg by mouth every 8 (eight) hours as needed for pain.   aspirin 81 MG tablet Take 81 mg by mouth daily.   carvedilol (COREG) 3.125 MG tablet TAKE 1 TABLET BY MOUTH TWICE  DAILY WITH MEALS   docusate sodium (COLACE) 100 MG capsule Take 100 mg by mouth daily as needed for mild constipation.   insulin lispro (HUMALOG) 100 UNIT/ML injection Inject 8 Units into the skin 3 (three) times daily before meals.   meclizine (ANTIVERT) 12.5 MG tablet Take 12.5 mg by mouth 3 (three) times daily.   methocarbamol (ROBAXIN) 500 MG tablet Take 1 tablet (500 mg total) by mouth daily as needed for muscle spasms.   naloxone (NARCAN) nasal spray 4 mg/0.1 mL SMARTSIG:Both Nares   NOVOFINE 32G X 6 MM MISC USE AS DIRECTED THREE TIMES DAILY WITH HUMALOG   olmesartan (BENICAR) 40 MG tablet Take 1 tablet (40 mg total) by mouth daily. (Patient taking differently: Take 40 mg by mouth every morning.)   pantoprazole (PROTONIX) 40 MG tablet Take 1 tablet (40 mg total) by mouth daily.   pantoprazole (PROTONIX) 40 MG tablet  Take 1 tablet (40 mg total) by mouth daily.   pioglitazone (ACTOS) 30 MG tablet Take 30 mg by mouth daily.   rosuvastatin (CRESTOR) 5 MG tablet Take 1 tablet (5 mg total) by mouth daily. (Patient taking differently: Take 5 mg by mouth every morning.)   torsemide (DEMADEX) 20 MG tablet Take 2 tablets (40 mg total) by mouth daily. (Patient taking differently: Take 40 mg by mouth every morning.)   vitamin B-12 (CYANOCOBALAMIN) 100 MCG tablet Take 100 mcg by mouth daily.   Vitamin D, Cholecalciferol, 50 MCG (2000 UT) CAPS Take 2,000 Units by mouth daily.   No facility-administered encounter medications on file as of 02/17/2023.    Allergies (verified) Atorvastatin, Gabapentin, Pravastatin, Pregabalin, and Trulicity [dulaglutide]   History: Past Medical History:  Diagnosis Date   (HFpEF) heart failure with preserved ejection fraction ([facility]    a.) TTE 05/29/2017: EF 55-60%, mild LAE, G1DD; b.) TTE 09/07/2017: EF >55%, mild LAE, Ao sclerosis without stenosis, degen MV disease, G2DD; c.) TTE 12/02/2018: EF 55-60%, mild LVH, Mild BAE, triv TR, G1DD; d.) TTE 11/22/2019: EF 60-65%, mod LVH, mild TR, G1DD   Anemia in stage 4 chronic kidney disease (HCC)    Arthritis    Cardiomyopathy ([place]    CKD (chronic kidney disease) stage 4, GFR 15-29 ml/min (HCC)    CVA (  cerebral vascular accident) ([place]) 08/2017   a.) no residual deficits   Diabetic neuropathy ([place])    Diverticulitis    GERD (gastroesophageal reflux disease)    History of hiatal hernia    Hyperlipidemia    Hypertension    Long term current use of antithrombotics/antiplatelets    a.) DAPT therapy (ASA + clopidogrel)   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 ([place])    Paraesophageal hernia    PHN (postherpetic neuralgia)    Sarcoidosis    Shingles    Type 2 diabetes mellitus treated with insulin ([place])    Wears dentures    partial upper   Past Surgical History:  Procedure Laterality Date   CATARACT EXTRACTION  W/PHACO Left 02/05/2021   Procedure: CATARACT EXTRACTION PHACO AND INTRAOCULAR LENS PLACEMENT ([place]) LEFT DIABETIC 5.47 00:51.4;  Surgeon: Birder Robson, MD;  Location: [place];  Service: Ophthalmology;  Laterality: Left;  Diabetic - insulin and oral meds   CATARACT EXTRACTION W/PHACO Right 03/19/2021   Procedure: CATARACT EXTRACTION PHACO AND INTRAOCULAR LENS PLACEMENT ([facility]) RIGHT DIABETIC 6.09 00:39.6;  Surgeon: Birder Robson, MD;  Location: [place];  Service: Ophthalmology;  Laterality: Right;   COLONOSCOPY  12/15/2006   COLONOSCOPY WITH PROPOFOL N/A 06/06/2019   Procedure: COLONOSCOPY WITH PROPOFOL;  Surgeon: [name], MD;  Location[facility] ENDOSCOPY;  Service: Gastroenterology;  Laterality: N/A;   COLONOSCOPY WITH PROPOFOL N/A 01/16/2020   Procedure: COLONOSCOPY WITH BIOPSY;  Surgeon: [name], MD;  Location: [place];  Service: Endoscopy;  Laterality: N/A;  Diabetic - insulin and oral meds   ESOPHAGOGASTRODUODENOSCOPY (EGD) WITH PROPOFOL N/A 06/23/2022   Procedure: ESOPHAGOGASTRODUODENOSCOPY (EGD) WITH PROPOFOL;  Surgeon: [name], MD;  Location: [facility] ENDOSCOPY;  Service: Gastroenterology;  Laterality: N/A;   EYE SURGERY     INSERTION OF MESH  08/26/2022   Procedure: INSERTION OF MESH;  Surgeon: [name], MD;  Location: [facility] ORS;  Service: General;;   POLYPECTOMY N/A 01/16/2020   Procedure: POLYPECTOMY;  Surgeon: [name], MD;  Location: [place];  Service: Endoscopy;  Laterality: N/A;   TUBAL LIGATION     XI ROBOTIC ASSISTED PARAESOPHAGEAL HERNIA REPAIR N/A 08/26/2022   Procedure: XI ROBOTIC ASSISTED PARAESOPHAGEAL HERNIA REPAIR, RNFA to assist;  Surgeon: [name], MD;  Location: [facility] ORS;  Service: General;  Laterality: N/A;   Family History  Problem Relation Age of Onset   Diabetes Mother    Stroke Mother    Hyperlipidemia Father    Hypertension Father    Diabetes Sister    Diabetes Brother    Gout Son     Diabetes Brother    Kidney disease Son    Lymphoma Sister    Heart attack Sister    Breast cancer Neg Hx    Social History   Socioeconomic History   Marital status: Married    Spouse name: Not on file   Number of children: Not on file   Years of education: Not on file   Highest education level: GED or equivalent  Occupational History   Occupation: retired  Tobacco Use   Smoking status: Never    Passive exposure: Never   Smokeless tobacco: Never  Vaping Use   Vaping Use: Never used  Substance and Sexual Activity   Alcohol use: No    Alcohol/week: 0.0 standard drinks of alcohol   Drug use: No   Sexual activity: Yes  Other Topics Concern   Not on file  Social History Narrative  Not on file   Social Determinants of Health   Financial Resource Strain: Low Risk  (02/17/2023)   Overall Financial Resource Strain (CARDIA)    Difficulty of Paying Living Expenses: Not hard at all  Food Insecurity: No Food Insecurity (02/17/2023)   Hunger Vital Sign    Worried About Running Out of Food in the Last Year: Never true    Ran Out of Food in the Last Year: Never true  Transportation Needs: No Transportation Needs (02/17/2023)   PRAPARE - Hydrologist (Medical): No    Lack of Transportation (Non-Medical): No  Physical Activity: Inactive (02/17/2023)   Exercise Vital Sign    Days of Exercise per Week: 0 days    Minutes of Exercise per Session: 0 min  Stress: No Stress Concern Present (02/17/2023)   [place]    Feeling of Stress : Not at all  Social Connections: Moderately Isolated (02/17/2023)   Social Connection and Isolation Panel [NHANES]    Frequency of Communication with Friends and Family: More than three times a week    Frequency of Social Gatherings with Friends and Family: Three times a week    Attends Religious Services: Never    Active Member of Clubs or Organizations: No    Attends  Archivist Meetings: Never    Marital Status: Married    Tobacco Counseling Counseling given: Not Answered   Clinical Intake:  Pre-visit preparation completed: Yes              Diabetic?yes Nutrition Risk Assessment:  Has the patient had any N/V/D within the last 2 months?  Yes  Does the patient have any non-healing wounds?  No  Has the patient had any unintentional weight loss or weight gain?  No   Diabetes:  Is the patient diabetic?  Yes  If diabetic, was a CBG obtained today?  No  Did the patient bring in their glucometer from home?  No  How often do you monitor your CBG's? 2-3x/day.   Financial Strains and Diabetes Management:  Are you having any financial strains with the device, your supplies or your medication? No .  Does the patient want to be seen by Chronic Care Management for management of their diabetes?  No  Would the patient like to be referred to a Nutritionist or for Diabetic Management?  No   Diabetic Exams:  Diabetic Eye Exam: Completed 06/06/22. [name] Pt has been advised about the importance in completing this exam.   Diabetic Foot Exam: Completed 08/05/22. Pt has been advised about the importance in completing this exam.          Activities of Daily Living    02/17/2023    3:02 PM 11/18/2022   10:33 AM  In your present state of health, do you have any difficulty performing the following activities:  Hearing? 0 0  Vision? 0 0  Difficulty concentrating or making decisions? 0 0  Walking or climbing stairs? 1 1  Comment  uses her cane, no falls  Dressing or bathing? 0 0  Doing errands, shopping? 0 0  Preparing Food and eating ? N N  Using the Toilet? N N  In the past six months, have you accidently leaked urine? N N  Do you have problems with loss of bowel control? N N  Managing your Medications? Y N  Managing your Finances? N N  Housekeeping or managing your Housekeeping? N N  Patient Care Team: [name], NP as PCP  - General (Nurse Practitioner) [name], MD as PCP - Cardiology (Cardiology) Solum, Betsey Holiday, MD as Physician Assistant (Endocrinology) Lavonia Dana, MD as Consulting Physician (Nephrology) [name], MD as Consulting Physician (Cardiology) Pa, [place] (Optometry) Earlie Server, MD as Consulting Physician (Oncology) Vanita Ingles, RN as Case Manager (General Practice)  Indicate any recent Medical Services you may have received from other than [facility] providers in the past year (date may be approximate).     Assessment:   This is a routine wellness examination for [place].  Hearing/Vision screen Hearing Screening - Comments:: No aids Vision Screening - Comments:: Wears glasses- [facility]  Dietary issues and exercise activities discussed: Current Exercise Habits: The patient does not participate in regular exercise at present   Goals Addressed             This Visit's Progress    DIET - EAT MORE FRUITS AND VEGETABLES         Depression Screen    02/17/2023    2:58 PM 12/22/2022    3:13 PM 12/01/2022    8:09 AM 11/18/2022   10:21 AM 10/23/2022    1:41 PM 01/29/2022    1:19 PM 11/12/2021   11:11 AM  PHQ 2/9 Scores  PHQ - 2 Score 0 1 0 0 0 0 0  PHQ- 9 Score 0 9    4     Fall Risk    02/17/2023    3:02 PM 01/19/2023   11:27 AM 12/22/2022    3:13 PM 12/01/2022    8:09 AM 11/18/2022   10:32 AM  Fall Risk   Falls in the past year? 0 0 0 0 0  Number falls in past yr: 0  0  0  Injury with Fall? 0  0  0  Risk for fall due to : No Fall Risks  Impaired balance/gait  Other (Comment)  Risk for fall due to: Comment     new onset of dizziness- reviewed  Follow up Falls prevention discussed;Falls evaluation completed  Falls evaluation completed  Falls evaluation completed;Education provided;Falls prevention discussed    FALL RISK PREVENTION PERTAINING TO THE HOME:  Any stairs in or around the home? Yes  If so, are there any without handrails? No  Home free  of loose throw rugs in walkways, pet beds, electrical cords, etc? Yes  Adequate lighting in your home to reduce risk of falls? Yes   ASSISTIVE DEVICES UTILIZED TO PREVENT FALLS:  Life alert? No  Use of a cane, walker or w/c? Yes - cane Grab bars in the bathroom? Yes  Shower chair or bench in shower? Yes  Elevated toilet seat or a handicapped toilet? No   TIMED UP AND GO:  Was the test performed? Yes .  Length of time to ambulate 10 feet: 4 sec.   Gait slow and steady with assistive device  Cognitive Function:        02/17/2023    3:10 PM 07/30/2020    2:38 PM 07/06/2019   10:26 AM 06/30/2018   10:43 AM 06/24/2017   10:24 AM  6CIT Screen  What Year? 0 points 0 points 0 points 0 points 0 points  What month? 0 points 0 points 0 points 0 points 0 points  What time? 0 points 0 points 0 points 0 points 0 points  Count back from 20 0 points 0 points 0 points 0 points 0  points  Months in reverse 2 points 4 points 0 points 0 points 0 points  Repeat phrase 0 points 0 points 0 points 2 points 0 points  Total Score 2 points 4 points 0 points 2 points 0 points    Immunizations Immunization History  Administered Date(s) Administered   Fluad Quad(high Dose 65+) 09/30/2019, 01/29/2021, 10/29/2021, 12/22/2022   Influenza, High Dose Seasonal PF 09/10/2016, 11/16/2018   Influenza,inj,Quad PF,6+ Mos 09/18/2015   Influenza-Unspecified 09/18/2015, 09/08/2017   Moderna Sars-Covid-2 Vaccination 02/15/2020, 03/07/2020, 09/14/2020   PPD Test 07/15/2016, 07/29/2016   Pneumococcal Conjugate-13 06/07/2014   Pneumococcal Polysaccharide-23 09/07/2013   Td 06/12/2005   Td (Adult), 2 Lf Tetanus Toxid, Preservative Free 06/12/2005   Tdap 10/10/2016   Zoster, Live 09/13/2013    TDAP status: Up to date  Flu Vaccine status: Up to date  Pneumococcal vaccine status: Up to date  Covid-19 vaccine status: Completed vaccines  Qualifies for Shingles Vaccine? Yes   Zostavax completed Yes   Shingrix  Completed?: No.    Education has been provided regarding the importance of this vaccine. Patient has been advised to call insurance company to determine out of pocket expense if they have not yet received this vaccine. Advised may also receive vaccine at local pharmacy or Health Dept. Verbalized acceptance and understanding.  Screening Tests Health Maintenance  Topic Date Due   COVID-19 Vaccine (4 - 2023-24 season) 08/15/2022   Zoster Vaccines- Shingrix (1 of 2) 03/23/2023 (Originally 02/09/1965)   HEMOGLOBIN A1C  02/24/2023   OPHTHALMOLOGY EXAM  06/07/2023   FOOT EXAM  08/06/2023   Diabetic kidney evaluation - eGFR measurement  12/23/2023   Diabetic kidney evaluation - Urine ACR  12/23/2023   Medicare Annual Wellness (AWV)  02/17/2024   DTaP/Tdap/Td (3 - Td or Tdap) 10/10/2026   DEXA SCAN  03/12/2027   COLONOSCOPY (Pts 45-24yr Insurance coverage will need to be confirmed)  01/15/2030   Pneumonia Vaccine 75 Years old  Completed   INFLUENZA VACCINE  Completed   Hepatitis C Screening  Completed   HPV VACCINES  Aged Out    Health Maintenance  Health Maintenance Due  Topic Date Due   COVID-19 Vaccine (4 - 2023-24 season) 08/15/2022    Colorectal cancer screening: No longer required.   Mammogram status: No longer required due to age.  Bone Density status: Completed 03/11/17. Results reflect: Bone density results: NORMAL. Repeat every 5 years.-declined referral  Lung Cancer Screening: (Low Dose CT Chest recommended if Age 75-80years, 30 pack-year currently smoking OR have quit w/in 15years.) does not qualify.   Additional Screening:  Hepatitis C Screening: does qualify; Completed 09/10/16  Vision Screening: Recommended annual ophthalmology exams for early detection of glaucoma and other disorders of the eye. Is the patient up to date with their annual eye exam?  Yes  Who is the provider or what is the name of the office in which the patient attends annual eye exams[place] [facility] [facility] is not established with a provider, would they like to be referred to a provider to establish care? No .   Dental Screening: Recommended annual dental exams for proper oral hygiene  Community Resource Referral / Chronic Care Management: CRR required this visit?  No   CCM required this visit?  No      Plan:     I have personally reviewed and noted the following in the patient's chart:   Medical and social history Use of alcohol, tobacco or illicit drugs  Current  medications and supplements including opioid prescriptions. Patient is not currently taking opioid prescriptions. Functional ability and status Nutritional status Physical activity Advanced directives List of other physicians Hospitalizations, surgeries, and ER visits in previous 12 months Vitals Screenings to include cognitive, depression, and falls Referrals and appointments  In addition, I have reviewed and discussed with patient certain preventive protocols, quality metrics, and best practice recommendations. A written personalized care plan for preventive services as well as general preventive health recommendations were provided to patient.     Dionisio David, LPN   [id number]   Nurse Notes: none

## 2023-02-17 NOTE — Patient Instructions (Signed)
[name] , Thank you for taking time to come for your Medicare Wellness Visit. I appreciate your ongoing commitment to your health goals. Please review the following plan we discussed and let me know if I can assist you in the future.   These are the goals we discussed:  Goals       CCM Expected Outcome:  Monitor, Self-Manage and Reduce Symptoms of Diabetes      Current Barriers:  Knowledge Deficits related to the importance of normalized blood sugars and A1C WNL to prevent possible complications with kidneys and other body systems  Chronic Disease Management support and education needs related to effective management of DM  Planned Interventions: Provided education to patient about basic DM disease process; Reviewed medications with patient and discussed importance of medication adherence;        Reviewed prescribed diet with patient heart healthy/ADA diet; Counseled on importance of regular laboratory monitoring as prescribed;        Discussed plans with patient for ongoing care management follow up and provided patient with direct contact information for care management team;      Provided patient with written educational materials related to hypo and hyperglycemia and importance of correct treatment;       Reviewed scheduled/upcoming provider appointments including: 02-06-2023 at 220 pm;         Advised patient, providing education and rationale, to check cbg once daily and when you have symptoms of low or high blood sugar and record. The patient states that her blood sugars have been good but could not provide numbers. The patient sees endocrinologist also. Discussed the goal of A1C. Most recent A1C 8.5 in August of 2023. The patient states she is supposed to have new blood work but missed her appointment. Encouraged the patient to get a new appointment to have updated blood work for effective management of DM        call provider for findings outside established parameters;       Review of  patient status, including review of consultants reports, relevant laboratory and other test results, and medications completed;       Advised patient to discuss changes in DM, questions, and concerns with provider;      Screening for signs and symptoms of depression related to chronic disease state;        Assessed social determinant of health barriers;        The patient has had a new onset of dizziness recently and has been changing her position slowly. Assessment completed. The patient denies any issues with low blood sugars. The patient states that her sugars have not been low. The patient has not taken  her blood pressure when the dizzy spells occur. Education on changing position slowly and when it is safe to do so check her blood sugars and blood pressure. Education on orthostatic hypotension. Collaboration with the pcp and advised the patient it was safe to take OTC meclizine for dizziness. Review of safety concerns and to call the office for changes. Also will attach Epleys maneuver information in myChart for the patient to try to see if this will help for her dizziness.   Symptom Management: Take medications as prescribed   Attend all scheduled provider appointments Call provider office for new concerns or questions  call the Suicide and Crisis Lifeline: 988 call the [place] National Suicide Prevention Lifeline: (581)539-3507 or TTY: (385)072-5590 TTY (316)718-3439) to talk to a trained counselor call 1-800-273-TALK (toll free, 24 hour hotline)  if experiencing a Mental Health or [place]  check blood sugar at prescribed times: once daily and when you have symptoms of low or high blood sugar check feet daily for cuts, sores or redness trim toenails straight across manage portion size wash and dry feet carefully every day wear comfortable, cotton socks wear comfortable, well-fitting shoes  Follow Up Plan: Telephone follow up appointment with care management team member  scheduled for: 02-10-2023 at 1030 am        CCM Expected Outcome:  Monitor, Self-Manage and Reduce Symptoms of Heart Failure      Current Barriers:  Knowledge Deficits related to monitoring for fluid shifts and wearing compression hose to help with extra fluid on board in legs and feet Chronic Disease Management support and education needs related to effective management of HF  Planned Interventions: Basic overview and discussion of pathophysiology of Heart Failure reviewed Provided education on low sodium diet. Patient states she has not had an appetite but it seems to be getting better than what it was. Education and support given Reviewed Heart Failure Action Plan in depth and provided written copy. The patient states she has not had swelling in her feet and legs in a while. She states that she is doing well and denies any acute findings today. She has not been having to wear her compression stocking and states her legs have been "flat". Education and support given Assessed need for readable accurate scales in home Provided education about placing scale on hard, flat surface Advised patient to weigh each morning after emptying bladder Discussed importance of daily weight and advised patient to weigh and record daily Reviewed role of diuretics in prevention of fluid overload and management of heart failure Discussed the importance of keeping all appointments with provider Provided patient with education about the role of exercise in the management of heart failure Advised patient to discuss changes in HF or heart health with provider Screening for signs and symptoms of depression related to chronic disease state  Assessed social determinant of health barriers EF is 60-65%  Symptom Management: Take medications as prescribed   Attend all scheduled provider appointments Call provider office for new concerns or questions  call the Suicide and Crisis Lifeline: 988 call the [place] National  Suicide Prevention Lifeline: 269-019-3480 or TTY: 914-410-2240 TTY 909 540 5172) to talk to a trained counselor call 1-800-273-TALK (toll free, 24 hour hotline) if experiencing a Mental Health or [place]  call office if I gain more than 2 pounds in one day or 5 pounds in one week use salt in moderation watch for swelling in feet, ankles and legs every day weigh myself daily develop a rescue plan follow rescue plan if symptoms flare-up track symptoms and what helps feel better or worse dress right for the weather, hot or cold  Follow Up Plan: Telephone follow up appointment with care management team member scheduled for: 02-10-2023 at 1030 am        CCM Expected Outcome:  Monitor, Self-Manage, and Reduce Symptoms of Hypertension      Current Barriers:  Knowledge Deficits related to checking blood pressures on a regular bases to pick up on trends and changes that could impact overall health and well being Chronic Disease Management support and education needs related to effective management of HTN  BP Readings from Last 3 Encounters:  11/17/22 (!) 144/84  10/23/22 (!) 161/87  10/20/22 (!) 152/90     Planned Interventions: Evaluation of current treatment plan related  to hypertension self management and patient's adherence to plan as established by provider. The patient has not been checking her blood pressures at home but states she feels like they have been good. The patient has been having new onset of dizziness and is noticing this when she is changing positions. Education provided. Discussed safety and falls prevention. ;   Provided education to patient re: stroke prevention, s/s of heart attack and stroke; Reviewed prescribed diet heart healthy/ADA diet  Reviewed medications with patient and discussed importance of compliance. The patient states compliance with medications. ;  Discussed plans with patient for ongoing care management follow up and provided patient  with direct contact information for care management team; Advised patient, providing education and rationale, to monitor blood pressure daily and record, calling PCP for findings outside established parameters;  Reviewed scheduled/upcoming provider appointments including: 02-06-2023 at 220 pm Advised patient to discuss changes in HTN or heart health with provider; Provided education on prescribed diet heart healthy/ADA diet ;  Discussed complications of poorly controlled blood pressure such as heart disease, stroke, circulatory complications, vision complications, kidney impairment, sexual dysfunction;  Collaboration with the pcp about the new onset of dizziness the patient is having. The patient states that she has noticed this recently when she gets up from sitting or changes position. Review of Epley's maneuver and OTC medications as recommended by the pcp. Screening for signs and symptoms of depression related to chronic disease state;  Assessed social determinant of health barriers;   Symptom Management: Take medications as prescribed   Attend all scheduled provider appointments Call provider office for new concerns or questions  call the Suicide and Crisis Lifeline: 988 call the [place] National Suicide Prevention Lifeline: (717) 191-8308 or TTY: (717) 298-3089 TTY 8125251517) to talk to a trained counselor call 1-800-273-TALK (toll free, 24 hour hotline) if experiencing a Mental Health or [place]  check blood pressure weekly learn about high blood pressure keep a blood pressure log take blood pressure log to all doctor appointments call doctor for signs and symptoms of high blood pressure develop an action plan for high blood pressure keep all doctor appointments take medications for blood pressure exactly as prescribed report new symptoms to your doctor  Follow Up Plan: Telephone follow up appointment with care management team member scheduled for: 02-10-2023 at 1030  am        [place]      PharmD "I need to stay healthy" (pt-stated)      CARE PLAN ENTRY (see longtitudinal plan of care for additional care plan information)  Current Barriers:  Diabetes: uncontrolled; complicated by chronic medical conditions including HTN, CKD, most recent A1c 8.5% Patient notes that she is going to take the [employer] paperwork back to Dr. Joycie Peek office when she goes to see her next. She is unsure status of FreeStyle Libre CGM order Medications managed by her daughter, Tonya [name], who fills a weekly pill box Most recent eGFR: ~25 mL/min per Care Everywhere Current antihyperglycemic regimen: Levemir 10 units daily - though has only been taking 8 units QAM; Humalog 6-8 units before meals (though often skipping if BG "low" and taking after her meal to correct), pioglitazone 30 mg daily (cardiology recommended consideration of d/c, endocrinology continued) Current glucose readings:  Fasting 110-120s Before supper: 200-230s Current meal patterns: Eats breakfast ~10 am Snack in mid afternoon: banana, canned peaches, pack of nabs Supper ~ 6 pm Cardiovascular  risk reduction: Current hypertensive regimen: carvedilol 3.125 mg BID, olmesartan 40 mg Current hyperlipidemia regimen: rosuvastatin 5 mg daily; last LDL 100, consider more stringent goal of <70 given hx CVA Current antiplatelet regimen: ASA 81 mg daily, clopidogrel 75 mg daily (hx acute CVA in 2018) 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 Encouraged to increase Levemir to 10 units every morning, and take Humalog 6 units before each meal (usually 2 meals) if pre-meal sugar >100. Reiterated these instructions multiple times and employed teach back method. Will mail instructions to patient as well  Extensive dietary discussion. Discussed appropriate serving sizes of her favorite snacks, like nabs and fruit. Encouraged to rinse syrup off of pre-packed fruits. Reviewed that since her pre-supper readings are the highest point of her day, cutting back on afternoon snacking should help this. She verbalized agreement.  Encouraged her to discuss FreeStyle Libre CGM with daughter, Tonya [name], and call Dr. Joycie Peek office for f/u support w/ DME company.   Patient Self Care Activities:  Patient will check blood glucose BID-TID, document, and provide at future appointments Patient will take medications as prescribed Patient will report any questions or concerns to provider   Please see past updates related to this goal by clicking on the "Past Updates" button in the selected goal          This is a list of the screening recommended for you and due dates:  Health Maintenance  Topic Date Due   COVID-19 Vaccine (4 - 2023-24 season) 08/15/2022   Zoster (Shingles) Vaccine (1 of 2) 03/23/2023*   Hemoglobin A1C  02/24/2023   Eye exam for diabetics  06/07/2023   Complete foot exam   08/06/2023   Yearly kidney function blood test for diabetes  12/23/2023   Yearly kidney health urinalysis for diabetes  12/23/2023   Medicare Annual Wellness Visit  02/17/2024   DTaP/Tdap/Td vaccine (3 - Td or Tdap) 10/10/2026   DEXA scan (bone density measurement)  03/12/2027   Colon Cancer Screening  01/15/2030   Pneumonia Vaccine  Completed   Flu Shot  Completed   Hepatitis C Screening: USPSTF Recommendation to screen - Ages 18-79 yo.  Completed   HPV Vaccine  Aged Out  *Topic was postponed. The date shown is  not the original due date.    Advanced directives: no  Conditions/risks identified: no  Next appointment: Follow up in one year for your annual wellness visit 02/23/24 @ 1:00 pm in person   Preventive Care 65 Years and Older, Female Preventive care refers to lifestyle choices and visits with your health care provider that can promote health and wellness. What does preventive care include? A yearly physical exam. This is also called an annual well check. Dental exams once or twice a year. Routine eye exams. Ask your health care provider how often you should have your eyes checked. Personal lifestyle choices, including: Daily care of your teeth and gums. Regular physical activity. Eating a healthy diet. Avoiding tobacco and drug use. Limiting alcohol use. Practicing safe sex. Taking low-dose aspirin every day. Taking vitamin and mineral supplements as recommended by your health care provider.  What happens during an annual well check? The services and screenings done by your health care provider during your annual well check will depend on your age, overall health, lifestyle risk factors, and family history of disease. Counseling  Your health care provider may ask you questions about your: Alcohol use. Tobacco use. Drug use. Emotional well-being. Home and relationship well-being. Sexual activity. Eating habits. History of falls. Memory and ability to understand (cognition). Work and work Statistician. Reproductive health. Screening  You may have the following tests or measurements: Height, weight, and BMI. Blood pressure. Lipid and cholesterol levels. These may be checked every 5 years, or more frequently if you are over 88 years old. Skin check. Lung cancer screening. You may have this screening every year starting at age 81 if you have a 30-pack-year history of smoking and currently smoke or have quit within the past 15 years. Fecal occult blood test (FOBT) of the stool.  You may have this test every year starting at age 68. Flexible sigmoidoscopy or colonoscopy. You may have a sigmoidoscopy every 5 years or a colonoscopy every 10 years starting at age 48. Hepatitis C blood test. Hepatitis B blood test. Sexually transmitted disease (STD) testing. Diabetes screening. This is done by checking your blood sugar (glucose) after you have not eaten for a while (fasting). You may have this done every 1-3 years. Bone density scan. This is done to screen for osteoporosis. You may have this done starting at age 51. Mammogram. This may be done every 1-2 years. Talk to your health care provider about how often you should have regular mammograms. Talk with your health care provider about your test results, treatment options, and if necessary, the need for more tests. Vaccines  Your health care provider may recommend certain vaccines, such as: Influenza vaccine. This is recommended every year. Tetanus, diphtheria, and acellular pertussis (Tdap, Td) vaccine. You may need a Td booster every 10 years. Zoster vaccine. You may need this after age 9. Pneumococcal 13-valent conjugate (PCV13) vaccine. One dose is recommended after age 48. Pneumococcal polysaccharide (PPSV23) vaccine. One dose is recommended after age 6. Talk to your health care provider about which screenings and vaccines you need and how often you need them. This information is not intended to replace advice given to you by your health care provider. Make sure you discuss any questions you have with your health care provider. Document Released: 12/28/2015 Document Revised: 08/20/2016 Document Reviewed: 10/02/2015 Elsevier Interactive Patient Education  2017 [place] Prevention in the Home Falls can cause injuries. They can happen to people of all ages. There are many things you can do to make your home safe and to help prevent falls. What can I do on the outside of my home? Regularly fix the edges of  walkways and driveways and fix any cracks. Remove anything that might make you trip as you walk through a door, such as a raised step or threshold. Trim any bushes or trees on the path to your home. Use bright outdoor lighting. Clear any walking paths of anything that might make someone trip, such as rocks or tools. Regularly check to see if handrails are loose or broken. Make sure that both sides of any steps have handrails. Any raised decks and porches should have guardrails on the edges. Have any leaves, snow, or ice cleared regularly. Use sand or salt on walking paths during winter. Clean up any spills in your garage right away. This includes oil or grease  spills. What can I do in the bathroom? Use night lights. Install grab bars by the toilet and in the tub and shower. Do not use towel bars as grab bars. Use non-skid mats or decals in the tub or shower. If you need to sit down in the shower, use a plastic, non-slip stool. Keep the floor dry. Clean up any water that spills on the floor as soon as it happens. Remove soap buildup in the tub or shower regularly. Attach bath mats securely with double-sided non-slip rug tape. Do not have throw rugs and other things on the floor that can make you trip. What can I do in the bedroom? Use night lights. Make sure that you have a light by your bed that is easy to reach. Do not use any sheets or blankets that are too big for your bed. They should not hang down onto the floor. Have a firm chair that has side arms. You can use this for support while you get dressed. Do not have throw rugs and other things on the floor that can make you trip. What can I do in the kitchen? Clean up any spills right away. Avoid walking on wet floors. Keep items that you use a lot in easy-to-reach places. If you need to reach something above you, use a strong step stool that has a grab bar. Keep electrical cords out of the way. Do not use floor polish or wax that  makes floors slippery. If you must use wax, use non-skid floor wax. Do not have throw rugs and other things on the floor that can make you trip. What can I do with my stairs? Do not leave any items on the stairs. Make sure that there are handrails on both sides of the stairs and use them. Fix handrails that are broken or loose. Make sure that handrails are as long as the stairways. Check any carpeting to make sure that it is firmly attached to the stairs. Fix any carpet that is loose or worn. Avoid having throw rugs at the top or bottom of the stairs. If you do have throw rugs, attach them to the floor with carpet tape. Make sure that you have a light switch at the top of the stairs and the bottom of the stairs. If you do not have them, ask someone to add them for you. What else can I do to help prevent falls? Wear shoes that: Do not have high heels. Have rubber bottoms. Are comfortable and fit you well. Are closed at the toe. Do not wear sandals. If you use a stepladder: Make sure that it is fully opened. Do not climb a closed stepladder. Make sure that both sides of the stepladder are locked into place. Ask someone to hold it for you, if possible. Clearly mark and make sure that you can see: Any grab bars or handrails. First and last steps. Where the edge of each step is. Use tools that help you move around (mobility aids) if they are needed. These include: Canes. Walkers. Scooters. Crutches. Turn on the lights when you go into a dark area. Replace any light bulbs as soon as they burn out. Set up your furniture so you have a clear path. Avoid moving your furniture around. If any of your floors are uneven, fix them. If there are any pets around you, be aware of where they are. Review your medicines with your doctor. Some medicines can make you feel dizzy. This can increase your chance  of falling. Ask your doctor what other things that you can do to help prevent falls. This  information is not intended to replace advice given to you by your health care provider. Make sure you discuss any questions you have with your health care provider. Document Released: 09/27/2009 Document Revised: 05/08/2016 Document Reviewed: 01/05/2015 Elsevier Interactive Patient Education  2017 [employer].

## 2023-02-17 NOTE — Assessment & Plan Note (Addendum)
Chronic with poor tolerance to Gabapentin higher doses in past and Lyrica at doses >25 MG.  She insists on trying Gabapentin again at lowest dose, which she reports tolerating in past.  Had lengthy discussion about concerns with this and she is aware of risks vs benefits.  We will trial lowest dose of Gabapentin 100 MG at night only, educated her at length on this.  Continue to follow with pain management and ortho, continue these collaborations.  Will plan on return in one week to assess tolerance.  She is aware if any side effects to immediately stop taking and alert provider.

## 2023-02-17 NOTE — Progress Notes (Signed)
BP 138/80 (BP Location: Left Arm, Patient Position: Sitting, Cuff Size: Normal)   Pulse 78   Temp 98.1 F (36.7 C) (Oral)   Ht 5' 7.01" (1.702 m)   Wt 143 lb 14.4 oz (65.3 kg)   LMP  (LMP Unknown)   SpO2 99%   BMI 22.53 kg/m    Subjective:    Patient ID: Tonya [name], female    DOB: Dec 24, 1945, 77 y.o.   MRN: [id number]  HPI: Tonya [name] is a 77 y.o. female  Chief Complaint  Patient presents with   Medication Consultation    Patient want to talk about getting back on Gabapentin at a lower dosage to see if it will work    POSTHERPETIC NEURALGIA Chronic issue, has seen pain clinic for this with injections in past, last visit 12/01/22.  In past she did take Lyrica and Gabapentin -- both led to increased fatigue and word finding difficulty with Lyrica -- she was in ER back on 12/01/19 for this.  At that time was on Lyrica 50 MG.  She reports previously took Gabapentin back when first had shingles in 2008 or 2009, but was higher dose and caused speech issues per patient -- would like to try a lower dose as reports that did not bother her.    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   Neuropathy status: uncontrolled  Satisfied with current treatment?: no Location: right hip and mid back Pain: yes Severity: 10/10 at worst Quality:  dull, aching, and throbbing Frequency: intermittent Bilateral: no Symmetric: no Numbness: yes Decreased sensation: no Weakness: no Context: fluctuating Alleviating factors: nothing Aggravating factors: movement Treatments attempted: injections, multiple medications = Gabapentin, Lyrica, Lidocaine patches, Capsaicin cream, Tylenol  Relevant past medical, surgical, family and social history reviewed and updated as indicated. Interim medical history since our last visit reviewed. Allergies and medications reviewed and updated.  Review of Systems  Constitutional:  Negative for activity change, appetite  change, diaphoresis, fatigue and fever.  Respiratory:  Negative for cough, chest tightness, shortness of breath and wheezing.   Cardiovascular:  Negative for chest pain, palpitations and leg swelling.  Gastrointestinal: Negative.   Musculoskeletal:  Positive for arthralgias.  Neurological: Negative.   Psychiatric/Behavioral: Negative.     Per HPI unless specifically indicated above     Objective:    BP 138/80 (BP Location: Left Arm, Patient Position: Sitting, Cuff Size: Normal)   Pulse 78   Temp 98.1 F (36.7 C) (Oral)   Ht 5' 7.01" (1.702 m)   Wt 143 lb 14.4 oz (65.3 kg)   LMP  (LMP Unknown)   SpO2 99%   BMI 22.53 kg/m   Wt Readings from Last 3 Encounters:  02/17/23 143 lb 14.4 oz (65.3 kg)  01/28/23 140 lb (63.5 kg)  01/19/23 140 lb (63.5 kg)    Physical Exam Vitals and nursing note reviewed.  Constitutional:      General: She is awake. She is not in acute distress.    Appearance: She is well-developed. She is not ill-appearing.  HENT:     Head: Normocephalic.     Right Ear: Hearing normal.     Left Ear: Hearing normal.  Eyes:     General: Lids are normal.        Right eye: No discharge.        Left eye: No discharge.     Conjunctiva/sclera: Conjunctivae normal.     Pupils: Pupils are equal,  round, and reactive to light.  Neck:     Thyroid: No thyromegaly.     Vascular: No carotid bruit.  Cardiovascular:     Rate and Rhythm: Normal rate and regular rhythm.     Heart sounds: Normal heart sounds. No murmur heard.    No gallop.  Pulmonary:     Effort: Pulmonary effort is normal. No accessory muscle usage or respiratory distress.     Breath sounds: Normal breath sounds.  Abdominal:     General: Bowel sounds are normal.     Palpations: Abdomen is soft.  Musculoskeletal:     Cervical back: Normal range of motion and neck supple.     Right lower leg: Edema (trace) present.     Left lower leg: Edema (trace) present.  Skin:    General: Skin is warm and dry.      Findings: No rash.  Neurological:     Mental Status: She is alert and oriented to person, place, and time.  Psychiatric:        Attention and Perception: Attention normal.        Mood and Affect: Mood normal.        Behavior: Behavior normal. Behavior is cooperative.        Thought Content: Thought content normal.        Judgment: Judgment normal.     Results for orders placed or performed during the hospital encounter of 01/27/23  I-STAT creatinine  Result Value Ref Range   Creatinine, Ser 2.80 (H) 0.44 - 1.00 mg/dL      Assessment & Plan:   Problem List Items Addressed This Visit       Nervous and Auditory   Post herpetic neuralgia - Primary    Chronic with poor tolerance to Gabapentin higher doses in past and Lyrica at doses >25 MG.  She insists on trying Gabapentin again at lowest dose, which she reports tolerating in past.  Had lengthy discussion about concerns with this and she is aware of risks vs benefits.  We will trial lowest dose of Gabapentin 100 MG at night only, educated her at length on this.  Continue to follow with pain management and ortho, continue these collaborations.  Will plan on return in one week to assess tolerance.  She is aware if any side effects to immediately stop taking and alert provider.        Follow up plan: Return in about 1 week (around 02/24/2023) for POSTHERPETIC NEUROPATHY -- started lowest dose Gabapentin.

## 2023-02-18 ENCOUNTER — Ambulatory Visit: Payer: Medicare Other | Admitting: Surgery

## 2023-02-22 NOTE — Patient Instructions (Signed)
Postherpetic Neuralgia Postherpetic neuralgia (PHN) is nerve pain that occurs after a shingles infection. Shingles is a painful rash that appears on one area of the body, usually on the trunk or face. Shingles is caused by the varicella-zoster virus. This is the same virus that causes chickenpox. In people who have had chickenpox, the virus can resurface years later and cause shingles. PHN appears in the same area where you had the shingles rash. The pain usually goes away after the rash disappears. You may have PHN if you continue to have pain 3 months after your shingles rash has gone away. What are the causes? This condition is caused by damage to your nerves due to inflammation from the varicella-zoster virus. The damage makes your nerves overly sensitive. What increases the risk? The following factors may make you more likely to develop this condition: Being older than 75 years of age. Having severe pain before your shingles rash starts. Having a severe rash. Having shingles in and around the eye area. Having a disease or taking medicine that causes you to have a weakened disease-fighting system (immune system). What are the signs or symptoms? The main symptom of this condition is pain. The pain may: Often be severe and may be described as stabbing, burning, shooting, or feeling like an electric shock. Come and go, or it may be there all the time. Be triggered by light touches on the skin or changes in temperature. You may have itching along with the pain. How is this diagnosed? This condition may be diagnosed based on your symptoms and your history of shingles. Lab studies and other diagnostic tests are usually not needed. How is this treated? There is no cure for this condition. Treatment for PHN will focus on pain relief. Over-the-counter pain relievers do not usually relieve PHN pain. You may need to work with a pain specialist. Treatment may include: Anti-seizure medicines to relieve  nerve pain. Antidepressant medicines to help with pain and improve sleep. A numbing patch worn on the skin (lidocaine patch). Strong pain relievers (opioids). Injections of numbing medicine or anti-inflammatory medicines around irritated nerves. Injections of botulinum toxin to block pain signals between nerves and muscles. Follow these instructions at home: Medicines Take over-the-counter and prescription medicines only as told by your health care provider. Ask your health care provider if the medicine prescribed to you: Requires you to avoid driving or using machinery. Can cause constipation. You may need to take these actions to prevent or treat constipation: Drink enough fluid to keep your urine pale yellow. Take over-the-counter or prescription medicines. Eat foods that are high in fiber, such as beans, whole grains, and fresh fruits and vegetables. Limit foods that are high in fat and processed sugars, such as fried or sweet foods. Managing pain  If directed, put ice on the painful area. To do this: Put ice in a plastic bag. Place a towel between your skin and the bag. Leave the ice on for 20 minutes, 2-3 times a day. Remove the ice if your skin turns bright red. This is very important. If you cannot feel pain, heat, or cold, you have a greater risk of damage to the area. Cover sensitive areas with a bandage (dressing) to reduce friction from clothing rubbing on the area. General instructions It may take a long time to recover from PHN. Work closely with your health care provider and develop a good support system at home. You may consider joining a support group. Wear loose, comfortable clothing. Talk   to your health care provider if you feel depressed or desperate. Living with long-term pain can be depressing. Keep all follow-up visits. This is important. How is this prevented? Getting a vaccination for shingles can prevent PHN. The shingles vaccine is recommended for people older  than age 50. It may prevent shingles and may also lower your risk of PHN if you do get shingles. Contact a health care provider if: Your medicine is not helping. You are struggling to manage your pain at home. Get help right away if: You have thoughts about hurting yourself or others. If you ever feel like you may hurt yourself or others, or have thoughts about taking your own life, get help right away. Go to your nearest emergency department or: Call your local emergency services (911 in the [place] Call a suicide crisis helpline, such as the National Suicide Prevention Lifeline at 1-800-273-8255 or 988 in the [place] This is open 24 hours a day in the [place] Text the Crisis Text Line at 741741 (in the [place] Summary Postherpetic neuralgia (PHN) is a very painful disorder that can occur after an episode of shingles. The pain is often severe and may be described as stabbing, burning, shooting, or feeling like an electric shock. Prescription medicines can be helpful in managing persistent pain. Getting a vaccination for shingles can prevent PHN. This vaccine is recommended for people older than age 50. This information is not intended to replace advice given to you by your health care provider. Make sure you discuss any questions you have with your health care provider. Document Revised: 06/26/2021 Document Reviewed: 11/26/2020 Elsevier Patient Education  2023 [employer]  

## 2023-02-25 ENCOUNTER — Encounter: Payer: Self-pay | Admitting: Nurse Practitioner

## 2023-02-25 ENCOUNTER — Ambulatory Visit (INDEPENDENT_AMBULATORY_CARE_PROVIDER_SITE_OTHER): Payer: Medicare Other | Admitting: Nurse Practitioner

## 2023-02-25 VITALS — BP 133/84 | HR 75 | Temp 97.9°F | Ht 67.01 in | Wt 145.2 lb

## 2023-02-25 DIAGNOSIS — B0229 Other postherpetic nervous system involvement: Secondary | ICD-10-CM

## 2023-02-25 MED ORDER — GABAPENTIN 100 MG PO CAPS: 100.0000 mg | ORAL_CAPSULE | Freq: Every day | ORAL | 1 refills | Status: DC

## 2023-02-25 NOTE — Assessment & Plan Note (Signed)
Chronic with poor tolerance to Gabapentin higher doses in past and Lyrica at doses >25 MG.  Tonya [name] insisted on trying Gabapentin again at lowest dose again and is currently tolerating without ADR.  Had lengthy discussion about concerns with this and Tonya [name] is aware of risks vs benefits.  We will continue lowest dose of Gabapentin 100 MG at night only, educated her at length on this.  Continue to follow with pain management and ortho, continue these collaborations.  Will plan on return in June as scheduled and if continues to tolerate we may trial 100 MG in morning added on.  Tonya [name] is aware if any side effects to immediately stop taking and alert provider.

## 2023-02-25 NOTE — Progress Notes (Signed)
BP 133/84   Pulse 75   Temp 97.9 F (36.6 C) (Oral)   Ht 5' 7.01" (1.702 m)   Wt 145 lb 3.2 oz (65.9 kg)   LMP  (LMP Unknown)   SpO2 99%   BMI 22.74 kg/m    Subjective:    Patient ID: Tonya [name], female    DOB: November 30, 1946, 77 y.o.   MRN: [id number]  HPI: Tonya [name] is a 77 y.o. female  Chief Complaint  Patient presents with   Medication follow up    Restarted Gabapentin as last visit at a much lower dose. Patient reports that she has not had any side effects.    POSTHERPETIC NEURALGIA Follow-up for starting Gabapentin 100 MG at bedtime, 02/17/23.  Has had no side effects with this, including no slurred speech issues.  Chronic issue, has seen pain clinic for this with injections in past, last visit 12/01/22.  In past she did take Lyrica and Gabapentin -- both led to increased fatigue and word finding difficulty with Lyrica -- she was in ER back on 12/01/19 for this.  At that time was on Lyrica 50 MG.  She reports previously took Gabapentin back when first had shingles in 2008 or 2009, but was higher dose and caused speech issues per patient -- would like to try a lower dose as reports that did not bother her.    Gabapentin helping at night time right now for pain, not helping during daytime.    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   Neuropathy status: improving Satisfied with current treatment?: no Location: right hip and mid back Pain: yes Severity: 10/10 at worst when gets up to go to bathroom Quality:  dull, aching, and throbbing Frequency: intermittent Bilateral: no Symmetric: no Numbness: yes Decreased sensation: no Weakness: no Context: fluctuating Alleviating factors: nothing Aggravating factors: movement Treatments attempted: injections, multiple medications = Gabapentin, Lyrica, Lidocaine patches, Capsaicin cream, Tylenol  Relevant past medical, surgical, family and social history reviewed and updated as  indicated. Interim medical history since our last visit reviewed. Allergies and medications reviewed and updated.  Review of Systems  Constitutional:  Negative for activity change, appetite change, diaphoresis, fatigue and fever.  Respiratory:  Negative for cough, chest tightness, shortness of breath and wheezing.   Cardiovascular:  Negative for chest pain, palpitations and leg swelling.  Gastrointestinal: Negative.   Musculoskeletal:  Positive for arthralgias.  Neurological: Negative.   Psychiatric/Behavioral: Negative.     Per HPI unless specifically indicated above     Objective:    BP 133/84   Pulse 75   Temp 97.9 F (36.6 C) (Oral)   Ht 5' 7.01" (1.702 m)   Wt 145 lb 3.2 oz (65.9 kg)   LMP  (LMP Unknown)   SpO2 99%   BMI 22.74 kg/m   Wt Readings from Last 3 Encounters:  02/25/23 145 lb 3.2 oz (65.9 kg)  02/17/23 143 lb 14.4 oz (65.3 kg)  01/28/23 140 lb (63.5 kg)    Physical Exam Vitals and nursing note reviewed.  Constitutional:      General: She is awake. She is not in acute distress.    Appearance: She is well-developed. She is not ill-appearing.  HENT:     Head: Normocephalic.     Right Ear: Hearing normal.     Left Ear: Hearing normal.  Eyes:     General: Lids are normal.        Right  eye: No discharge.        Left eye: No discharge.     Conjunctiva/sclera: Conjunctivae normal.     Pupils: Pupils are equal, round, and reactive to light.  Neck:     Thyroid: No thyromegaly.     Vascular: No carotid bruit.  Cardiovascular:     Rate and Rhythm: Normal rate and regular rhythm.     Heart sounds: Normal heart sounds. No murmur heard.    No gallop.  Pulmonary:     Effort: Pulmonary effort is normal. No accessory muscle usage or respiratory distress.     Breath sounds: Normal breath sounds.  Abdominal:     General: Bowel sounds are normal.     Palpations: Abdomen is soft.  Musculoskeletal:     Cervical back: Normal range of motion and neck supple.      Right lower leg: Edema (trace) present.     Left lower leg: Edema (trace) present.  Skin:    General: Skin is warm and dry.     Findings: No rash.  Neurological:     Mental Status: She is alert and oriented to person, place, and time.  Psychiatric:        Attention and Perception: Attention normal.        Mood and Affect: Mood normal.        Behavior: Behavior normal. Behavior is cooperative.        Thought Content: Thought content normal.        Judgment: Judgment normal.    Results for orders placed or performed during the hospital encounter of 01/27/23  I-STAT creatinine  Result Value Ref Range   Creatinine, Ser 2.80 (H) 0.44 - 1.00 mg/dL      Assessment & Plan:   Problem List Items Addressed This Visit       Nervous and Auditory   Post herpetic neuralgia - Primary    Chronic with poor tolerance to Gabapentin higher doses in past and Lyrica at doses >25 MG.  She insisted on trying Gabapentin again at lowest dose again and is currently tolerating without ADR.  Had lengthy discussion about concerns with this and she is aware of risks vs benefits.  We will continue lowest dose of Gabapentin 100 MG at night only, educated her at length on this.  Continue to follow with pain management and ortho, continue these collaborations.  Will plan on return in June as scheduled and if continues to tolerate we may trial 100 MG in morning added on.  She is aware if any side effects to immediately stop taking and alert provider.        Follow up plan: Return for as scheduled in June.

## 2023-03-03 ENCOUNTER — Inpatient Hospital Stay: Payer: Medicare Other | Admitting: Oncology

## 2023-03-03 ENCOUNTER — Inpatient Hospital Stay: Payer: Medicare Other

## 2023-03-03 NOTE — Assessment & Plan Note (Deleted)
History of iron deficiency anemia,  anemia secondary to chronic kidney disease. Labs reviewed and discussed patient. Acute drop of hemoglobin likely secondary to recent GI bleeding events. Iron panel shows ferritin at goal.  Ferritin may be falsely elevated due to recent surgery.  Close monitor. Recommend patient to proceed with Retacrit 20,000 units x 1. Close monitor counts. 

## 2023-03-04 ENCOUNTER — Encounter: Payer: Self-pay | Admitting: Oncology

## 2023-03-04 ENCOUNTER — Inpatient Hospital Stay (HOSPITAL_BASED_OUTPATIENT_CLINIC_OR_DEPARTMENT_OTHER): Payer: Medicare Other | Admitting: Oncology

## 2023-03-04 ENCOUNTER — Inpatient Hospital Stay: Payer: Medicare Other

## 2023-03-04 ENCOUNTER — Inpatient Hospital Stay: Payer: Medicare Other | Attending: Oncology

## 2023-03-04 VITALS — BP 159/81 | HR 87 | Temp 96.0°F | Resp 18 | Wt 145.4 lb

## 2023-03-04 DIAGNOSIS — D631 Anemia in chronic kidney disease: Secondary | ICD-10-CM

## 2023-03-04 DIAGNOSIS — N184 Chronic kidney disease, stage 4 (severe): Secondary | ICD-10-CM | POA: Diagnosis not present

## 2023-03-04 LAB — CBC WITH DIFFERENTIAL/PLATELET
Abs Immature Granulocytes: 0.04 10*3/uL (ref 0.00–0.07)
Basophils Absolute: 0 10*3/uL (ref 0.0–0.1)
Basophils Relative: 0 %
Eosinophils Absolute: 0.1 10*3/uL (ref 0.0–0.5)
Eosinophils Relative: 2 %
HCT: 31.3 % — ABNORMAL LOW (ref 36.0–46.0)
Hemoglobin: 9.6 g/dL — ABNORMAL LOW (ref 12.0–15.0)
Immature Granulocytes: 1 %
Lymphocytes Relative: 21 %
Lymphs Abs: 1.3 10*3/uL (ref 0.7–4.0)
MCH: 27.7 pg (ref 26.0–34.0)
MCHC: 30.7 g/dL (ref 30.0–36.0)
MCV: 90.5 fL (ref 80.0–100.0)
Monocytes Absolute: 0.5 10*3/uL (ref 0.1–1.0)
Monocytes Relative: 8 %
Neutro Abs: 4.5 10*3/uL (ref 1.7–7.7)
Neutrophils Relative %: 68 %
Platelets: 245 10*3/uL (ref 150–400)
RBC: 3.46 MIL/uL — ABNORMAL LOW (ref 3.87–5.11)
RDW: 14.9 % (ref 11.5–15.5)
WBC: 6.5 10*3/uL (ref 4.0–10.5)
nRBC: 0 % (ref 0.0–0.2)

## 2023-03-04 MED ORDER — EPOETIN ALFA-EPBX 10000 UNIT/ML IJ SOLN
10000.0000 [IU] | Freq: Once | INTRAMUSCULAR | Status: AC
  Administered 2023-03-04: 10000 [IU] via SUBCUTANEOUS
  Filled 2023-03-04: qty 1

## 2023-03-04 NOTE — Assessment & Plan Note (Addendum)
History of iron deficiency anemia,  anemia secondary to chronic kidney disease. Labs reviewed and discussed patient. Retacrit 10,000 units x 1 today. Recommend Retacrit every 4 weeks if hemoglobin is less than 10.

## 2023-03-04 NOTE — Progress Notes (Signed)
Hematology/Oncology Progress note [phone] [phone] [phone]     CHIEF COMPLAINTS/REASON FOR VISIT:  Follow-up for anemia due to chronic kidney disease   ASSESSMENT & PLAN:   Anemia in stage 4 chronic kidney disease (HCC) History of iron deficiency anemia,  anemia secondary to chronic kidney disease. Labs reviewed and discussed patient. Retacrit 10,000 units x 1 today. Recommend Retacrit every 4 weeks if hemoglobin is less than 10.   Orders Placed This Encounter  Procedures   Hemoglobin and Hematocrit (Cancer Center Only)    Standing Status:   Standing    Number of Occurrences:   3    Standing Expiration Date:   03/03/2024   CBC with Differential (Cancer Center Only)    Standing Status:   Future    Standing Expiration Date:   03/03/2024   Ferritin    Standing Status:   Future    Standing Expiration Date:   03/03/2024   Iron and TIBC    Standing Status:   Future    Standing Expiration Date:   03/03/2024   Retic Panel    Standing Status:   Future    Standing Expiration Date:   03/03/2024   Follow-up  H&H labs in 4w, 8w,12w +/- retacrit 16 weeks, lab MD +/- retacrit    All questions were answered. The patient knows to call the clinic with any problems, questions or concerns.  Earlie Server, MD, PhD [facility] Hematology Oncology 03/04/2023     HISTORY OF PRESENTING ILLNESS:  Tonya [name] is a  77 y.o.  female with PMH listed below who was referred to me for evaluation of anemia Reviewed patient's recent labs that was done at [facility] office. Labs revealed anemia with hemoglobin of 8.9, MCV 82, normal platelet and wbc  Reviewed patient's previous labs ordered by primary care physician's office, anemia is chronic onset , duration is since 2017 No aggravating or improving factors.  Associated signs and symptoms: Patient reports fatigue.  SOB with exertion.  Denies weight loss, easy bruising, hematochezia, hemoptysis, hematuria. Context: History of GI  bleeding: denies               History of Chronic kidney disease: CKD, follows up with Dr.Kolluru.                History of autoimmune disease: denies               History of hemolytic anemia. denies               Last colonoscopy: underwent colonoscopy on 06/06/2019.  Due to poor preparation, patient was scheduled to repeat colonoscopy due to suboptimal bowel preparation.  Patient told me that she is waiting for insurance approval for repeat procedure.  Previously tolerated IV Venofer treatments very well.  acute drop of hemoglobin to 5.9, status post PRBC transfusion at [facility].  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  She was discharged with PPI for 6 weeks. Hemoglobin at discharge on 12/10/2019 was 8.6.  INTERVAL HISTORY Tonya [name] is a 77 y.o. female who has above history reviewed by me today presents to reestablish care for chronic anemia. She reports feeling well today.  No new complaints.  Review of Systems  Constitutional:  Positive for fatigue. Negative for appetite change, chills and fever.  HENT:   Negative for hearing loss and voice change.   Eyes:  Negative for  eye problems.  Respiratory:  Negative for chest tightness and cough.   Cardiovascular:  Negative for chest pain.  Gastrointestinal:  Negative for abdominal distention, abdominal pain and blood in stool.  Endocrine: Negative for hot flashes.  Genitourinary:  Negative for difficulty urinating and frequency.   Musculoskeletal:  Negative for arthralgias.  Skin:  Negative for itching and rash.  Neurological:  Negative for extremity weakness.  Hematological:  Negative for adenopathy.  Psychiatric/Behavioral:  Negative for confusion.     MEDICAL HISTORY:  Past Medical History:  Diagnosis Date   (HFpEF) heart failure with preserved ejection fraction ([place])    a.) TTE 05/29/2017: EF 55-60%, mild  LAE, G1DD; b.) TTE 09/07/2017: EF >55%, mild LAE, Ao sclerosis without stenosis, degen MV disease, G2DD; c.) TTE 12/02/2018: EF 55-60%, mild LVH, Mild BAE, triv TR, G1DD; d.) TTE 11/22/2019: EF 60-65%, mod LVH, mild TR, G1DD   Anemia in stage 4 chronic kidney disease (HCC)    Arthritis    Cardiomyopathy ([place])    CKD (chronic kidney disease) stage 4, GFR 15-29 ml/min (HCC)    CVA (cerebral vascular accident) ([place]) 08/2017   a.) no residual deficits   Diabetic neuropathy (HCC)    Diverticulitis    GERD (gastroesophageal reflux disease)    History of hiatal hernia    Hyperlipidemia    Hypertension    Long term current use of antithrombotics/antiplatelets    a.) DAPT therapy (ASA + clopidogrel)   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 ([place])    Paraesophageal hernia    PHN (postherpetic neuralgia)    Sarcoidosis    Shingles    Type 2 diabetes mellitus treated with insulin ([place])    Wears dentures    partial upper    SURGICAL HISTORY: Past Surgical History:  Procedure Laterality Date   CATARACT EXTRACTION W/PHACO Left 02/05/2021   Procedure: CATARACT EXTRACTION PHACO AND INTRAOCULAR LENS PLACEMENT ([place]) LEFT DIABETIC 5.47 00:51.4;  Surgeon: Birder Robson, MD;  Location: [place];  Service: Ophthalmology;  Laterality: Left;  Diabetic - insulin and oral meds   CATARACT EXTRACTION W/PHACO Right 03/19/2021   Procedure: CATARACT EXTRACTION PHACO AND INTRAOCULAR LENS PLACEMENT ([facility]) RIGHT DIABETIC 6.09 00:39.6;  Surgeon: Birder Robson, MD;  Location: [place];  Service: Ophthalmology;  Laterality: Right;   COLONOSCOPY  12/15/2006   COLONOSCOPY WITH PROPOFOL N/A 06/06/2019   Procedure: COLONOSCOPY WITH PROPOFOL;  Surgeon: [name], MD;  Location: [facility] ENDOSCOPY;  Service: Gastroenterology;  Laterality: N/A;   COLONOSCOPY WITH PROPOFOL N/A 01/16/2020   Procedure: COLONOSCOPY WITH BIOPSY;  Surgeon: [name], MD;  Location: [place];  Service: Endoscopy;  Laterality: N/A;  Diabetic - insulin and oral meds   ESOPHAGOGASTRODUODENOSCOPY (EGD) WITH PROPOFOL N/A 06/23/2022   Procedure: ESOPHAGOGASTRODUODENOSCOPY (EGD) WITH PROPOFOL;  Surgeon: [name], MD;  Location: [facility] ENDOSCOPY;  Service: Gastroenterology;  Laterality: N/A;   EYE SURGERY     INSERTION OF MESH  08/26/2022   Procedure: INSERTION OF MESH;  Surgeon: [name], MD;  Location: [facility] ORS;  Service: General;;   POLYPECTOMY N/A 01/16/2020   Procedure: POLYPECTOMY;  Surgeon: [name], MD;  Location: [place];  Service: Endoscopy;  Laterality: N/A;   TUBAL LIGATION     XI ROBOTIC ASSISTED PARAESOPHAGEAL HERNIA REPAIR N/A 08/26/2022   Procedure: XI ROBOTIC ASSISTED PARAESOPHAGEAL HERNIA REPAIR, RNFA to assist;  Surgeon: [name], MD;  Location: [facility] ORS;  Service: General;  Laterality: N/A;    SOCIAL HISTORY:  Social History   Socioeconomic History   Marital status: Married    Spouse name: Not on file   Number of children: Not on file   Years of education: Not on file   Highest education level: GED or equivalent  Occupational History   Occupation: retired  Tobacco Use   Smoking status: Never    Passive exposure: Never   Smokeless tobacco: Never  Vaping Use   Vaping Use: Never used  Substance and Sexual Activity   Alcohol use: No    Alcohol/week: 0.0 standard drinks of alcohol   Drug use: No   Sexual activity: Yes  Other Topics Concern   Not on file  Social History Narrative   Not on file   Social Determinants of Health   Financial Resource Strain: Low Risk  (02/17/2023)   Overall Financial Resource Strain (CARDIA)    Difficulty of Paying Living Expenses: Not hard at all  Food Insecurity: No Food Insecurity (02/17/2023)   Hunger Vital Sign    Worried About Running Out of Food in the Last Year: Never true    [place] in the Last Year: Never true  Transportation Needs: No Transportation Needs (02/17/2023)   PRAPARE  - Hydrologist (Medical): No    Lack of Transportation (Non-Medical): No  Physical Activity: Inactive (02/17/2023)   Exercise Vital Sign    Days of Exercise per Week: 0 days    Minutes of Exercise per Session: 0 min  Stress: No Stress Concern Present (02/17/2023)   [place]    Feeling of Stress : Not at all  Social Connections: Moderately Isolated (02/17/2023)   Social Connection and Isolation Panel [NHANES]    Frequency of Communication with Friends and Family: More than three times a week    Frequency of Social Gatherings with Friends and Family: Three times a week    Attends Religious Services: Never    Active Member of Clubs or Organizations: No    Attends Archivist Meetings: Never    Marital Status: Married  Human resources officer Violence: Not At Risk (02/17/2023)   Humiliation, Afraid, Rape, and Kick questionnaire    Fear of Current or Ex-Partner: No    Emotionally Abused: No    Physically Abused: No    Sexually Abused: No    FAMILY HISTORY: Family History  Problem Relation Age of Onset   Diabetes Mother    Stroke Mother    Hyperlipidemia Father    Hypertension Father    Diabetes Sister    Diabetes Brother    Gout Son    Diabetes Brother    Kidney disease Son    Lymphoma Sister    Heart attack Sister    Breast cancer Neg Hx     ALLERGIES:  is allergic to atorvastatin, gabapentin, pravastatin, pregabalin, and trulicity [dulaglutide].  MEDICATIONS:  Current Outpatient Medications  Medication Sig Dispense Refill   acetaminophen (TYLENOL) 650 MG CR tablet Take 650 mg by mouth every 8 (eight) hours as needed for pain.     aspirin 81 MG tablet Take 81 mg by mouth daily.     carvedilol (COREG) 3.125 MG tablet TAKE 1 TABLET BY MOUTH TWICE  DAILY WITH MEALS 180 tablet 3   docusate sodium (COLACE) 100 MG capsule Take 100 mg by mouth daily as needed for mild constipation.      gabapentin (NEURONTIN) 100 MG capsule Take 1 capsule (100 mg  total) by mouth at bedtime. 90 capsule 1   insulin lispro (HUMALOG) 100 UNIT/ML injection Inject 8 Units into the skin 3 (three) times daily before meals.     meclizine (ANTIVERT) 12.5 MG tablet Take 12.5 mg by mouth 3 (three) times daily.     methocarbamol (ROBAXIN) 500 MG tablet Take 1 tablet (500 mg total) by mouth daily as needed for muscle spasms. 30 tablet 2   naloxone (NARCAN) nasal spray 4 mg/0.1 mL SMARTSIG:Both Nares     NOVOFINE 32G X 6 MM MISC USE AS DIRECTED THREE TIMES DAILY WITH HUMALOG 100 each 3   olmesartan (BENICAR) 40 MG tablet Take 1 tablet (40 mg total) by mouth daily. (Patient taking differently: Take 40 mg by mouth every morning.) 90 tablet 4   pantoprazole (PROTONIX) 40 MG tablet Take 1 tablet (40 mg total) by mouth daily. 90 tablet 3   pioglitazone (ACTOS) 30 MG tablet Take 30 mg by mouth daily.     rosuvastatin (CRESTOR) 5 MG tablet Take 1 tablet (5 mg total) by mouth daily. (Patient taking differently: Take 5 mg by mouth every morning.) 90 tablet 3   torsemide (DEMADEX) 20 MG tablet Take 2 tablets (40 mg total) by mouth daily. (Patient taking differently: Take 40 mg by mouth every morning.) 180 tablet 3   vitamin B-12 (CYANOCOBALAMIN) 100 MCG tablet Take 100 mcg by mouth daily.     Vitamin D, Cholecalciferol, 50 MCG (2000 UT) CAPS Take 2,000 Units by mouth daily.     No current facility-administered medications for this visit.     PHYSICAL EXAMINATION: ECOG PERFORMANCE STATUS: 1 - Symptomatic but completely ambulatory Vitals:   03/04/23 1052  BP: (!) 159/81  Pulse: 87  Resp: 18  Temp: (!) 96 F (35.6 C)  SpO2: 100%   Filed Weights   03/04/23 1052  Weight: 145 lb 6.4 oz (66 kg)    Physical Exam Constitutional:      General: She is not in acute distress. HENT:     Head: Normocephalic and atraumatic.  Eyes:     General: No scleral icterus.    Pupils: Pupils are equal, round, and reactive to  light.  Cardiovascular:     Rate and Rhythm: Normal rate.  Pulmonary:     Effort: Pulmonary effort is normal. No respiratory distress.     Breath sounds: No wheezing.  Abdominal:     General: Bowel sounds are normal. There is no distension.     Palpations: Abdomen is soft.  Musculoskeletal:        General: No deformity. Normal range of motion.     Cervical back: Normal range of motion and neck supple.  Skin:    General: Skin is warm and dry.     Findings: No erythema or rash.  Neurological:     Mental Status: She is alert and oriented to person, place, and time.     Cranial Nerves: No cranial nerve deficit.     Coordination: Coordination normal.  Psychiatric:        Mood and Affect: Mood normal.      LABORATORY DATA:  I have reviewed the data as listed    Latest Ref Rng & Units 03/04/2023   10:27 AM 12/22/2022    4:00 PM 11/17/2022    9:44 AM  CBC  WBC 4.0 - 10.5 K/uL 6.5  6.8    Hemoglobin 12.0 - 15.0 g/dL 9.6  8.6  10.4   Hematocrit 36.0 - 46.0 % 31.3  27.3  33.9   Platelets 150 - 400 K/uL 245  284        Latest Ref Rng & Units 01/27/2023    7:42 AM 12/22/2022    4:00 PM 08/27/2022    4:42 AM  CMP  Glucose 70 - 99 mg/dL  217  77   BUN 8 - 27 mg/dL  35  61   Creatinine 0.44 - 1.00 mg/dL 2.80  2.17  2.65   Sodium 134 - 144 mmol/L  143  144   Potassium 3.5 - 5.2 mmol/L  5.0  4.5   Chloride 96 - 106 mmol/L  110  118   CO2 20 - 29 mmol/L  21  21   Calcium 8.7 - 10.3 mg/dL  8.9  8.2   Total Protein 6.0 - 8.5 g/dL  5.5    Total Bilirubin 0.0 - 1.2 mg/dL  <0.2    Alkaline Phos 44 - 121 IU/L  62    AST 0 - 40 IU/L  14    ALT 0 - 32 IU/L  7     Lab Results  Component Value Date   IRON 77 12/22/2022   TIBC 272 12/22/2022   FERRITIN 251 (H) 12/22/2022

## 2023-03-26 DIAGNOSIS — Z794 Long term (current) use of insulin: Secondary | ICD-10-CM | POA: Diagnosis not present

## 2023-03-26 DIAGNOSIS — E1121 Type 2 diabetes mellitus with diabetic nephropathy: Secondary | ICD-10-CM | POA: Diagnosis not present

## 2023-03-28 ENCOUNTER — Encounter: Payer: Self-pay | Admitting: Pulmonary Disease

## 2023-03-29 ENCOUNTER — Other Ambulatory Visit: Payer: Self-pay | Admitting: Cardiovascular Disease

## 2023-04-01 ENCOUNTER — Inpatient Hospital Stay: Payer: Medicare Other

## 2023-04-01 ENCOUNTER — Inpatient Hospital Stay: Payer: Medicare Other | Attending: Oncology

## 2023-04-01 VITALS — BP 176/82 | HR 73

## 2023-04-01 DIAGNOSIS — D631 Anemia in chronic kidney disease: Secondary | ICD-10-CM | POA: Diagnosis not present

## 2023-04-01 DIAGNOSIS — N184 Chronic kidney disease, stage 4 (severe): Secondary | ICD-10-CM | POA: Insufficient documentation

## 2023-04-01 LAB — HEMOGLOBIN AND HEMATOCRIT (CANCER CENTER ONLY)
HCT: 30.9 % — ABNORMAL LOW (ref 36.0–46.0)
Hemoglobin: 9.6 g/dL — ABNORMAL LOW (ref 12.0–15.0)

## 2023-04-01 MED ORDER — EPOETIN ALFA-EPBX 10000 UNIT/ML IJ SOLN
10000.0000 [IU] | Freq: Once | INTRAMUSCULAR | Status: AC
  Administered 2023-04-01: 10000 [IU] via SUBCUTANEOUS
  Filled 2023-04-01: qty 1

## 2023-04-02 DIAGNOSIS — E1129 Type 2 diabetes mellitus with other diabetic kidney complication: Secondary | ICD-10-CM | POA: Diagnosis not present

## 2023-04-02 DIAGNOSIS — E1121 Type 2 diabetes mellitus with diabetic nephropathy: Secondary | ICD-10-CM | POA: Diagnosis not present

## 2023-04-02 DIAGNOSIS — Z794 Long term (current) use of insulin: Secondary | ICD-10-CM | POA: Diagnosis not present

## 2023-04-02 DIAGNOSIS — E113393 Type 2 diabetes mellitus with moderate nonproliferative diabetic retinopathy without macular edema, bilateral: Secondary | ICD-10-CM | POA: Diagnosis not present

## 2023-04-02 DIAGNOSIS — R809 Proteinuria, unspecified: Secondary | ICD-10-CM | POA: Diagnosis not present

## 2023-04-06 ENCOUNTER — Telehealth: Payer: Medicare Other

## 2023-04-06 ENCOUNTER — Ambulatory Visit (INDEPENDENT_AMBULATORY_CARE_PROVIDER_SITE_OTHER): Payer: Medicare Other

## 2023-04-06 DIAGNOSIS — I10 Essential (primary) hypertension: Secondary | ICD-10-CM

## 2023-04-06 DIAGNOSIS — I130 Hypertensive heart and chronic kidney disease with heart failure and stage 1 through stage 4 chronic kidney disease, or unspecified chronic kidney disease: Secondary | ICD-10-CM

## 2023-04-06 DIAGNOSIS — I5032 Chronic diastolic (congestive) heart failure: Secondary | ICD-10-CM

## 2023-04-06 DIAGNOSIS — E1140 Type 2 diabetes mellitus with diabetic neuropathy, unspecified: Secondary | ICD-10-CM

## 2023-04-06 NOTE — Chronic Care Management (AMB) (Signed)
Chronic Care Management   CCM RN Visit Note  04/06/2023 Name: Tonya [name] MRN: [id number] DOB: December 05, 1946  Subjective: Tonya [name] is a 77 y.o. year old female who is a primary care patient of Cannady, Dorie Rank, NP. The patient was referred to the Chronic Care Management team for assistance with care management needs subsequent to provider initiation of CCM services and plan of care.    Today's Visit:  Engaged with patient by telephone for follow up visit.        Goals Addressed             This Visit's Progress    CCM Expected Outcome:  Monitor, Self-Manage and Reduce Symptoms of Diabetes       Current Barriers:  Knowledge Deficits related to the importance of normalized blood sugars and A1C WNL to prevent possible complications with kidneys and other body systems  Chronic Disease Management support and education needs related to effective management of DM Lab Results  Component Value Date   HGBA1C 8.1 (H) 08/26/2022     Planned Interventions: Provided education to patient about basic DM disease process. The patient saw the endocrinologist last week. Wants to upgrade her meter so her readings are captured automatically. The patient states that she is waiting for the order for that to be processed. ; Reviewed medications with patient and discussed importance of medication adherence. States compliance with medications. ;        Reviewed prescribed diet with patient heart healthy/ADA diet. Review of dietary restrictions. Education on monitoring for changes and foods that cause her blood sugars to be out of range. ; Counseled on importance of regular laboratory monitoring as prescribed;        Discussed plans with patient for ongoing care management follow up and provided patient with direct contact information for care management team;      Provided patient with written educational materials related to hypo and hyperglycemia and importance of correct treatment. Denies any  lows. Per her continuous reader her readings have been 41% above range and her target is 70-140;       Reviewed scheduled/upcoming provider appointments including: 06-15-2023 at 320 pm;         Advised patient, providing education and rationale, to check cbg once daily and when you have symptoms of low or high blood sugar and record. The patient states that her blood sugars have been good but could not provide numbers.  The patient had readings of 58% at target, 1% below target, and 41% above target. Her target range is 70 to 140.  The patient sees endocrinologist also and the endocrinologist has ordered a new continuous glucose reader as the readings were fasting or at around lunch time. An upgrade will show more readings. Discussed the goal of A1C. Most recent A1C 8.5 on April 2024 at the endocrinologist office.  call provider for findings outside established parameters;       Review of patient status, including review of consultants reports, relevant laboratory and other test results, and medications completed;       Advised patient to discuss changes in DM, questions, and concerns with provider;      Screening for signs and symptoms of depression related to chronic disease state;        Assessed social determinant of health barriers;        The patient has had a new onset of dizziness recently and has been changing her position slowly. Assessment completed. The patient  denies any issues with low blood sugars. The patient states that her sugars have not been low. The patient has not taken  her blood pressure when the dizzy spells occur. Education on changing position slowly and when it is safe to do so check her blood sugars and blood pressure. Education on orthostatic hypotension. Collaboration with the pcp and advised the patient it was safe to take OTC meclizine for dizziness. Review of safety concerns and to call the office for changes. Also will attach Epleys maneuver information in myChart for the  patient to try to see if this will help for her dizziness. The patient has had a fall since last outreach and has not tried Epley's maneuver. Will attach information in myChart again.  Symptom Management: Take medications as prescribed   Attend all scheduled provider appointments Call provider office for new concerns or questions  call the Suicide and Crisis Lifeline: 988 call the [place] National Suicide Prevention Lifeline: 204 609 3835 or TTY: 7817829431 TTY (520)098-7806) to talk to a trained counselor call 1-800-273-TALK (toll free, 24 hour hotline) if experiencing a Mental Health or Behavioral Health Crisis  check blood sugar at prescribed times: once daily and when you have symptoms of low or high blood sugar check feet daily for cuts, sores or redness trim toenails straight across manage portion size wash and dry feet carefully every day wear comfortable, cotton socks wear comfortable, well-fitting shoes  Follow Up Plan: Telephone follow up appointment with care management team member scheduled for: 06-09-2023 at 145 pm       CCM Expected Outcome:  Monitor, Self-Manage and Reduce Symptoms of Heart Failure       Current Barriers:  Knowledge Deficits related to monitoring for fluid shifts and wearing compression hose to help with extra fluid on board in legs and feet Chronic Disease Management support and education needs related to effective management of HF Wt Readings from Last 3 Encounters:  03/04/23 145 lb 6.4 oz (66 kg)  02/25/23 145 lb 3.2 oz (65.9 kg)  02/17/23 143 lb 14.4 oz (65.3 kg)     Planned Interventions: Basic overview and discussion of pathophysiology of Heart Failure reviewed. The patients weight is staying stable. Denies any issues with fluid overload, swelling or edema in her feet and legs. The patient sees cardiologist on a regular basis. Review of CKD and eGFR of 23. The patient states she will see the kidney specialist on May the 9th.  Provided education on  low sodium diet. Patient states she has not had an appetite but it seems to be getting better than what it was. Education and support given Reviewed Heart Failure Action Plan in depth and provided written copy. The patient states she has not had swelling in her feet and legs in a while. She states that she is doing well and denies any acute findings today. She has not been having to wear her compression stocking and states her legs have been "flat". Education and support given Assessed need for readable accurate scales in home Provided education about placing scale on hard, flat surface Advised patient to weigh each morning after emptying bladder Discussed importance of daily weight and advised patient to weigh and record daily Reviewed role of diuretics in prevention of fluid overload and management of heart failure Discussed the importance of keeping all appointments with provider Provided patient with education about the role of exercise in the management of heart failure Advised patient to discuss changes in HF or heart health with provider Screening  for signs and symptoms of depression related to chronic disease state  Assessed social determinant of health barriers EF is 60-65%  Symptom Management: Take medications as prescribed   Attend all scheduled provider appointments Call provider office for new concerns or questions  call the Suicide and Crisis Lifeline: 988 call the [place] National Suicide Prevention Lifeline: 7038105766 or TTY: (314)624-1953 TTY 515-334-7711) to talk to a trained counselor call 1-800-273-TALK (toll free, 24 hour hotline) if experiencing a Mental Health or Behavioral Health Crisis  call office if I gain more than 2 pounds in one day or 5 pounds in one week use salt in moderation watch for swelling in feet, ankles and legs every day weigh myself daily develop a rescue plan follow rescue plan if symptoms flare-up track symptoms and what helps feel better or  worse dress right for the weather, hot or cold  Follow Up Plan: Telephone follow up appointment with care management team member scheduled for: 06-09-2023 at 145 pm       CCM Expected Outcome:  Monitor, Self-Manage, and Reduce Symptoms of Hypertension       Current Barriers:  Knowledge Deficits related to checking blood pressures on a regular bases to pick up on trends and changes that could impact overall health and well being Chronic Disease Management support and education needs related to effective management of HTN  BP Readings from Last 3 Encounters:  04/01/23 (!) 176/82  03/04/23 (!) 159/81  02/25/23 133/84     Planned Interventions: Evaluation of current treatment plan related to hypertension self management and patient's adherence to plan as established by provider. The patient with elevations in her blood pressures. Has not been having headaches more than usual. The patient has not been checking her blood pressures at home but states she feels like they have been good. The patient has been having new onset of dizziness and is noticing this when she is changing positions. The patient had a fall last week when she got up to use the bathroom and came back she was dizzy. She fell and hit her head. The patient states she did not go to get evaluated or called the provider. Review of letting the pcp know when she has new falls. Discussed Epleys maneuver and will attach information in the AVS for the patient. Education provided. Discussed safety and falls prevention. ;   Provided education to patient re: stroke prevention, s/s of heart attack and stroke; Reviewed prescribed diet heart healthy/ADA diet. The patient is eating well. Review of dietary restrictions. Reviewed medications with patient and discussed importance of compliance. The patient states compliance with medications. ;  Discussed plans with patient for ongoing care management follow up and provided patient with direct contact  information for care management team; Advised patient, providing education and rationale, to monitor blood pressure daily and record, calling PCP for findings outside established parameters;  Reviewed scheduled/upcoming provider appointments including: 06-15-2023 at 320 pm Advised patient to discuss changes in HTN or heart health with provider; Provided education on prescribed diet heart healthy/ADA diet ;  Discussed complications of poorly controlled blood pressure such as heart disease, stroke, circulatory complications, vision complications, kidney impairment, sexual dysfunction;  Collaboration with the pcp about the new onset of dizziness the patient is having. The patient states that she has noticed this recently when she gets up from sitting or changes position. Review of Epley's maneuver and OTC medications as recommended by the pcp. Screening for signs and symptoms of depression related to chronic  disease state;  Assessed social determinant of health barriers;   Symptom Management: Take medications as prescribed   Attend all scheduled provider appointments Call provider office for new concerns or questions  call the Suicide and Crisis Lifeline: 988 call the [place] National Suicide Prevention Lifeline: (713) 381-8466 or TTY: 319-617-0408 TTY (831)164-5719) to talk to a trained counselor call 1-800-273-TALK (toll free, 24 hour hotline) if experiencing a Mental Health or Behavioral Health Crisis  check blood pressure weekly learn about high blood pressure keep a blood pressure log take blood pressure log to all doctor appointments call doctor for signs and symptoms of high blood pressure develop an action plan for high blood pressure keep all doctor appointments take medications for blood pressure exactly as prescribed report new symptoms to your doctor  Follow Up Plan: Telephone follow up appointment with care management team member scheduled for: 06-09-2023 at 145 pm           Plan:Telephone follow up appointment with care management team member scheduled for:  06-09-2023 at 145 pm  Alto Denver RN, MSN, CCM RN Care Manager  Chronic Care Management Direct Number: [phone]

## 2023-04-06 NOTE — Patient Instructions (Signed)
Please call the care guide team at [phone] if you need to cancel or reschedule your appointment.   If you are experiencing a Mental Health or Behavioral Health Crisis or need someone to talk to, please call the Suicide and Crisis Lifeline: 988 call the [place] National Suicide Prevention Lifeline: (818)798-7274 or TTY: 864-607-1605 TTY (410)855-3442) to talk to a trained counselor call 1-800-273-TALK (toll free, 24 hour hotline)   Following is a copy of the CCM Program Consent:  CCM service includes personalized support from designated clinical staff supervised by the physician, including individualized plan of care and coordination with other care providers 24/7 contact phone numbers for assistance for urgent and routine care needs. Service will only be billed when office clinical staff spend 20 minutes or more in a month to coordinate care. Only one practitioner may furnish and bill the service in a calendar month. The patient may stop CCM services at amy time (effective at the end of the month) by phone call to the office staff. The patient will be responsible for cost sharing (co-pay) or up to 20% of the service fee (after annual deductible is met)  Following is a copy of your full provider care plan:   Goals Addressed             This Visit's Progress    CCM Expected Outcome:  Monitor, Self-Manage and Reduce Symptoms of Diabetes       Current Barriers:  Knowledge Deficits related to the importance of normalized blood sugars and A1C WNL to prevent possible complications with kidneys and other body systems  Chronic Disease Management support and education needs related to effective management of DM Lab Results  Component Value Date   HGBA1C 8.1 (H) 08/26/2022     Planned Interventions: Provided education to patient about basic DM disease process. The patient saw the endocrinologist last week. Wants to upgrade her meter so her readings are captured automatically. The patient  states that she is waiting for the order for that to be processed. ; Reviewed medications with patient and discussed importance of medication adherence. States compliance with medications. ;        Reviewed prescribed diet with patient heart healthy/ADA diet. Review of dietary restrictions. Education on monitoring for changes and foods that cause her blood sugars to be out of range. ; Counseled on importance of regular laboratory monitoring as prescribed;        Discussed plans with patient for ongoing care management follow up and provided patient with direct contact information for care management team;      Provided patient with written educational materials related to hypo and hyperglycemia and importance of correct treatment. Denies any lows. Per her continuous reader her readings have been 41% above range and her target is 70-140;       Reviewed scheduled/upcoming provider appointments including: 06-15-2023 at 320 pm;         Advised patient, providing education and rationale, to check cbg once daily and when you have symptoms of low or high blood sugar and record. The patient states that her blood sugars have been good but could not provide numbers.  The patient had readings of 58% at target, 1% below target, and 41% above target. Her target range is 70 to 140.  The patient sees endocrinologist also and the endocrinologist has ordered a new continuous glucose reader as the readings were fasting or at around lunch time. An upgrade will show more readings. Discussed the goal of A1C. Most  recent A1C 8.5 on April 2024 at the endocrinologist office.  call provider for findings outside established parameters;       Review of patient status, including review of consultants reports, relevant laboratory and other test results, and medications completed;       Advised patient to discuss changes in DM, questions, and concerns with provider;      Screening for signs and symptoms of depression related to chronic  disease state;        Assessed social determinant of health barriers;        The patient has had a new onset of dizziness recently and has been changing her position slowly. Assessment completed. The patient denies any issues with low blood sugars. The patient states that her sugars have not been low. The patient has not taken  her blood pressure when the dizzy spells occur. Education on changing position slowly and when it is safe to do so check her blood sugars and blood pressure. Education on orthostatic hypotension. Collaboration with the pcp and advised the patient it was safe to take OTC meclizine for dizziness. Review of safety concerns and to call the office for changes. Also will attach Epleys maneuver information in myChart for the patient to try to see if this will help for her dizziness. The patient has had a fall since last outreach and has not tried Epley's maneuver. Will attach information in myChart again.  Symptom Management: Take medications as prescribed   Attend all scheduled provider appointments Call provider office for new concerns or questions  call the Suicide and Crisis Lifeline: 988 call the [place] National Suicide Prevention Lifeline: (267) 450-5508 or TTY: 905-612-7390 TTY 204-867-6908) to talk to a trained counselor call 1-800-273-TALK (toll free, 24 hour hotline) if experiencing a Mental Health or Behavioral Health Crisis  check blood sugar at prescribed times: once daily and when you have symptoms of low or high blood sugar check feet daily for cuts, sores or redness trim toenails straight across manage portion size wash and dry feet carefully every day wear comfortable, cotton socks wear comfortable, well-fitting shoes  Follow Up Plan: Telephone follow up appointment with care management team member scheduled for: 06-09-2023 at 145 pm       CCM Expected Outcome:  Monitor, Self-Manage and Reduce Symptoms of Heart Failure       Current Barriers:  Knowledge  Deficits related to monitoring for fluid shifts and wearing compression hose to help with extra fluid on board in legs and feet Chronic Disease Management support and education needs related to effective management of HF Wt Readings from Last 3 Encounters:  03/04/23 145 lb 6.4 oz (66 kg)  02/25/23 145 lb 3.2 oz (65.9 kg)  02/17/23 143 lb 14.4 oz (65.3 kg)     Planned Interventions: Basic overview and discussion of pathophysiology of Heart Failure reviewed. The patients weight is staying stable. Denies any issues with fluid overload, swelling or edema in her feet and legs. The patient sees cardiologist on a regular basis. Review of CKD and eGFR of 23. The patient states she will see the kidney specialist on May the 9th.  Provided education on low sodium diet. Patient states she has not had an appetite but it seems to be getting better than what it was. Education and support given Reviewed Heart Failure Action Plan in depth and provided written copy. The patient states she has not had swelling in her feet and legs in a while. She states that she is doing  well and denies any acute findings today. She has not been having to wear her compression stocking and states her legs have been "flat". Education and support given Assessed need for readable accurate scales in home Provided education about placing scale on hard, flat surface Advised patient to weigh each morning after emptying bladder Discussed importance of daily weight and advised patient to weigh and record daily Reviewed role of diuretics in prevention of fluid overload and management of heart failure Discussed the importance of keeping all appointments with provider Provided patient with education about the role of exercise in the management of heart failure Advised patient to discuss changes in HF or heart health with provider Screening for signs and symptoms of depression related to chronic disease state  Assessed social determinant of  health barriers EF is 60-65%  Symptom Management: Take medications as prescribed   Attend all scheduled provider appointments Call provider office for new concerns or questions  call the Suicide and Crisis Lifeline: 988 call the [place] National Suicide Prevention Lifeline: 803-400-1814 or TTY: (941)452-3107 TTY 918-355-6115) to talk to a trained counselor call 1-800-273-TALK (toll free, 24 hour hotline) if experiencing a Mental Health or Behavioral Health Crisis  call office if I gain more than 2 pounds in one day or 5 pounds in one week use salt in moderation watch for swelling in feet, ankles and legs every day weigh myself daily develop a rescue plan follow rescue plan if symptoms flare-up track symptoms and what helps feel better or worse dress right for the weather, hot or cold  Follow Up Plan: Telephone follow up appointment with care management team member scheduled for: 06-09-2023 at 145 pm       CCM Expected Outcome:  Monitor, Self-Manage, and Reduce Symptoms of Hypertension       Current Barriers:  Knowledge Deficits related to checking blood pressures on a regular bases to pick up on trends and changes that could impact overall health and well being Chronic Disease Management support and education needs related to effective management of HTN  BP Readings from Last 3 Encounters:  04/01/23 (!) 176/82  03/04/23 (!) 159/81  02/25/23 133/84     Planned Interventions: Evaluation of current treatment plan related to hypertension self management and patient's adherence to plan as established by provider. The patient with elevations in her blood pressures. Has not been having headaches more than usual. The patient has not been checking her blood pressures at home but states she feels like they have been good. The patient has been having new onset of dizziness and is noticing this when she is changing positions. The patient had a fall last week when she got up to use the bathroom  and came back she was dizzy. She fell and hit her head. The patient states she did not go to get evaluated or called the provider. Review of letting the pcp know when she has new falls. Discussed Epleys maneuver and will attach information in the AVS for the patient. Education provided. Discussed safety and falls prevention. ;   Provided education to patient re: stroke prevention, s/s of heart attack and stroke; Reviewed prescribed diet heart healthy/ADA diet. The patient is eating well. Review of dietary restrictions. Reviewed medications with patient and discussed importance of compliance. The patient states compliance with medications. ;  Discussed plans with patient for ongoing care management follow up and provided patient with direct contact information for care management team; Advised patient, providing education and rationale, to monitor blood  pressure daily and record, calling PCP for findings outside established parameters;  Reviewed scheduled/upcoming provider appointments including: 06-15-2023 at 320 pm Advised patient to discuss changes in HTN or heart health with provider; Provided education on prescribed diet heart healthy/ADA diet ;  Discussed complications of poorly controlled blood pressure such as heart disease, stroke, circulatory complications, vision complications, kidney impairment, sexual dysfunction;  Collaboration with the pcp about the new onset of dizziness the patient is having. The patient states that she has noticed this recently when she gets up from sitting or changes position. Review of Epley's maneuver and OTC medications as recommended by the pcp. Screening for signs and symptoms of depression related to chronic disease state;  Assessed social determinant of health barriers;   Symptom Management: Take medications as prescribed   Attend all scheduled provider appointments Call provider office for new concerns or questions  call the Suicide and Crisis Lifeline:  988 call the [place] National Suicide Prevention Lifeline: 226-876-6009 or TTY: (208) 689-8069 TTY 731-261-2670) to talk to a trained counselor call 1-800-273-TALK (toll free, 24 hour hotline) if experiencing a Mental Health or Behavioral Health Crisis  check blood pressure weekly learn about high blood pressure keep a blood pressure log take blood pressure log to all doctor appointments call doctor for signs and symptoms of high blood pressure develop an action plan for high blood pressure keep all doctor appointments take medications for blood pressure exactly as prescribed report new symptoms to your doctor  Follow Up Plan: Telephone follow up appointment with care management team member scheduled for: 06-09-2023 at 145 pm          Patient verbalizes understanding of instructions and care plan provided today and agrees to view in MyChart. Active MyChart status and patient understanding of how to access instructions and care plan via MyChart confirmed with patient.  Telephone follow up appointment with care management team member scheduled for: 06-09-2023 at 145 pm  How to Perform the Epley Maneuver The Epley maneuver is an exercise that relieves symptoms of vertigo. Vertigo is the feeling that you or your surroundings are moving when they are not. When you feel vertigo, you may feel like the room is spinning and may have trouble walking. The Epley maneuver is used for a type of vertigo caused by a calcium deposit in a part of the inner ear. The maneuver involves changing head positions to help the deposit move out of the area. You can do this maneuver at home whenever you have symptoms of vertigo. You can repeat it in 24 hours if your vertigo has not gone away. Even though the Epley maneuver may relieve your vertigo for a few weeks, it is possible that your symptoms will return. This maneuver relieves vertigo, but it does not relieve dizziness. What are the risks? If it is done correctly,  the Epley maneuver is considered safe. Sometimes it can lead to dizziness or nausea that goes away after a short time. If you develop other symptoms--such as changes in vision, weakness, or numbness--stop doing the maneuver and call your health care provider. Supplies needed: A bed or table. A pillow. How to do the Epley maneuver     Sit on the edge of a bed or table with your back straight and your legs extended or hanging over the edge of the bed or table. Turn your head halfway toward the affected ear or side as told by your health care provider. Lie backward quickly with your head turned until you  are lying flat on your back. Your head should dangle (head-hanging position). You may want to position a pillow under your shoulders. Hold this position for at least 30 seconds. If you feel dizzy or have symptoms of vertigo, continue to hold the position until the symptoms stop. Turn your head to the opposite direction until your unaffected ear is facing down. Your head should continue to dangle. Hold this position for at least 30 seconds. If you feel dizzy or have symptoms of vertigo, continue to hold the position until the symptoms stop. Turn your whole body to the same side as your head so that you are positioned on your side. Your head will now be nearly facedown and no longer needs to dangle. Hold for at least 30 seconds. If you feel dizzy or have symptoms of vertigo, continue to hold the position until the symptoms stop. Sit back up. You can repeat the maneuver in 24 hours if your vertigo does not go away. Follow these instructions at home: For 24 hours after doing the Epley maneuver: Keep your head in an upright position. When lying down to sleep or rest, keep your head raised (elevated) with two or more pillows. Avoid excessive neck movements. Activity Do not drive or use machinery if you feel dizzy. After doing the Epley maneuver, return to your normal activities as told by your health  care provider. Ask your health care provider what activities are safe for you. General instructions Drink enough fluid to keep your urine pale yellow. Do not drink alcohol. Take over-the-counter and prescription medicines only as told by your health care provider. Keep all follow-up visits. This is important. Preventing vertigo symptoms Ask your health care provider if there is anything you should do at home to prevent vertigo. He or she may recommend that you: Keep your head elevated with two or more pillows while you sleep. Do not sleep on the side of your affected ear. Get up slowly from bed. Avoid sudden movements during the day. Avoid extreme head positions or movement, such as looking up or bending over. Contact a health care provider if: Your vertigo gets worse. You have other symptoms, including: Nausea. Vomiting. Headache. Get help right away if you: Have vision changes. Have a headache or neck pain that is severe or getting worse. Cannot stop vomiting. Have new numbness or weakness in any part of your body. These symptoms may represent a serious problem that is an emergency. Do not wait to see if the symptoms will go away. Get medical help right away. Call your local emergency services (911 in the [place] Do not drive yourself to the hospital. Summary Vertigo is the feeling that you or your surroundings are moving when they are not. The Epley maneuver is an exercise that relieves symptoms of vertigo. If the Epley maneuver is done correctly, it is considered safe. This information is not intended to replace advice given to you by your health care provider. Make sure you discuss any questions you have with your health care provider. Document Revised: 10/31/2020 Document Reviewed: 10/31/2020 Elsevier Patient Education  2023 [employer].

## 2023-04-13 ENCOUNTER — Ambulatory Visit: Payer: Medicare Other

## 2023-04-13 NOTE — Patient Outreach (Signed)
Care Management & Coordination Services Pharmacy Note  04/13/2023 Name:  Tonya [name] MRN:  [id number] DOB:  Mar 15, 1946  Summary: -Pleasant 77 year old female presents for f/u CCM visit. States she has no hobbies. Wasn't open to talking about it  Recommendations/Changes made from today's visit: -Patient kept hanging up on [place] and never returned phone calls when we tried to do Ozempic PAP in the past. I explained the process and who French Ana is. Patient agreed not to hang up. Will ask Dr. Jayme Cloud again if it's OK to do Ozempic for patient -Patient had no AWV in 2023 -Patient states she rarely takes evening insulin shot. Counseled her to take as Dr. Jayme Cloud directed  Subjective: Tonya [name] is an 78 y.o. year old female who is a primary patient of Cannady, Dorie Rank, NP.  The care coordination team was consulted for assistance with disease management and care coordination needs.    Engaged with patient by telephone for follow up visit.  Recent office visits:  01/12/23-[name] T. Harvest Dark, NP (PCP) Seen for hypertension. Ambulatory referral to Orthopedic for lumbar degenerative disc disease. Follow up in 5 months.  12/22/22-[name] T. Harvest Dark, NP (PCP) Seen for hospital follow up visit. Labs ordered. Fly vaccine given. Follow up in 3 weeks.   Recent consult visits:  01/02/23-Elena Elisha Ponder, PA (Endocrinology) General follow up visit. Follow up in 3 months.  12/29/22-Bilal Cherylann Ratel, MD (Pain medicine)  12/25/22-[name] Cathie Hoops, MD (Oncology) Follow-up for anemia due to chronic kidney disease. Labs ordered. 10/29/22-Munsoor Cherylann Ratel, MD (Nephrology) Seen for a follow up visit. Follow up in 10 weeks. 10/23/22-Bilal Cherylann Ratel, MD (Pain medicine) Seen for evalutation and management of her Lumbar feet. Follow up in 4 weeks.   Hospital visits:  None in previous 6 months   Objective:  Lab Results  Component Value Date   CREATININE 2.80 (H) 01/27/2023   BUN 35 (H) 12/22/2022    EGFR 23 (L) 12/22/2022   GFRNONAA 18 (L) 08/27/2022   GFRAA 20 (L) 01/29/2021   NA 143 12/22/2022   K 5.0 12/22/2022   CALCIUM 8.9 12/22/2022   CO2 21 12/22/2022   GLUCOSE 217 (H) 12/22/2022    Lab Results  Component Value Date/Time   HGBA1C 8.1 (H) 08/26/2022 04:13 PM   HGBA1C 8.2 (H) 01/29/2021 11:05 AM   HGBA1C 7.9 (H) 10/25/2020 11:24 AM   HGBA1C 7.6 07/09/2020 12:00 AM   HGBA1C 8.5 03/28/2020 12:00 AM   MICROALBUR 150 (H) 12/22/2022 03:58 PM   MICROALBUR 150 (H) 01/29/2021 11:05 AM    Last diabetic Eye exam:  Lab Results  Component Value Date/Time   HMDIABEYEEXA Retinopathy (A) 06/06/2022 12:00 AM    Last diabetic Foot exam: No results found for: "HMDIABFOOTEX"   Lab Results  Component Value Date   CHOL 184 12/22/2022   HDL 87 12/22/2022   LDLCALC 80 12/22/2022   LDLDIRECT 67 12/30/2018   TRIG 94 12/22/2022   CHOLHDL 2.5 12/02/2019       Latest Ref Rng & Units 12/22/2022    4:00 PM 04/15/2022    1:34 PM 02/12/2021    5:06 PM  Hepatic Function  Total Protein 6.0 - 8.5 g/dL 5.5  6.3  6.6   Albumin 3.8 - 4.8 g/dL 3.5  3.2  3.4   AST 0 - 40 IU/L 14  21  21    ALT 0 - 32 IU/L 7  12  12    Alk Phosphatase 44 - 121 IU/L 62  57  62  Total Bilirubin 0.0 - 1.2 mg/dL <1.6  0.6  0.6     Lab Results  Component Value Date/Time   TSH 2.220 12/22/2022 04:00 PM   TSH 2.040 08/05/2022 02:37 PM       Latest Ref Rng & Units 04/01/2023   10:21 AM 03/04/2023   10:27 AM 12/22/2022    4:00 PM  CBC  WBC 4.0 - 10.5 K/uL  6.5  6.8   Hemoglobin 12.0 - 15.0 g/dL 9.6  9.6  8.6   Hematocrit 36.0 - 46.0 % 30.9  31.3  27.3   Platelets 150 - 400 K/uL  245  284     Lab Results  Component Value Date/Time   VD25OH 22.7 (L) 12/22/2022 04:00 PM   VD25OH 24.4 (L) 08/05/2022 02:37 PM   VITAMINB12 486 12/22/2022 04:00 PM   VITAMINB12 305 02/15/2019 02:05 PM    Clinical ASCVD: Yes  The ASCVD Risk score (Arnett DK, et al., 2019) failed to calculate for the following reasons:   The  patient has a prior MI or stroke diagnosis    Other: (CHADS2VASc if Afib, MMRC or CAT for COPD, ACT, DEXA)     02/17/2023    2:58 PM 12/22/2022    3:13 PM 12/01/2022    8:09 AM  Depression screen PHQ 2/9  Decreased Interest 0 1 0  Down, Depressed, Hopeless 0 0 0  PHQ - 2 Score 0 1 0  Altered sleeping 0 1   Tired, decreased energy 0 2   Change in appetite 0 3   Feeling bad or failure about yourself  0 1   Trouble concentrating 0 0   Moving slowly or fidgety/restless 0 1   Suicidal thoughts 0 0   PHQ-9 Score 0 9      Social History   Tobacco Use  Smoking Status Never   Passive exposure: Never  Smokeless Tobacco Never   BP Readings from Last 3 Encounters:  04/01/23 (!) 176/82  03/04/23 (!) 159/81  02/25/23 133/84   Pulse Readings from Last 3 Encounters:  04/01/23 73  03/04/23 87  02/25/23 75   Wt Readings from Last 3 Encounters:  03/04/23 145 lb 6.4 oz (66 kg)  02/25/23 145 lb 3.2 oz (65.9 kg)  02/17/23 143 lb 14.4 oz (65.3 kg)   BMI Readings from Last 3 Encounters:  03/04/23 22.77 kg/m  02/25/23 22.74 kg/m  02/17/23 22.53 kg/m    Allergies  Allergen Reactions   Atorvastatin     Myalgias    Gabapentin Other (See Comments)    Speech impairment    Pravastatin     Myalgias    Pregabalin     Speech impairment   Trulicity [Dulaglutide] Hives    Medications Reviewed Today     Reviewed by Marlowe Sax, RN (Case Manager) on 04/06/23 at 1411  Med List Status: <None>   Medication Order Taking? Sig Documenting Provider Last Dose Status Informant  acetaminophen (TYLENOL) 650 MG CR tablet [id number] No Take 650 mg by mouth every 8 (eight) hours as needed for pain. [provider] Taking Active   aspirin 81 MG tablet [id number] No Take 81 mg by mouth daily. [provider] Taking Active Self  carvedilol (COREG) 3.125 MG tablet [id number] No TAKE 1 TABLET BY MOUTH TWICE  DAILY WITH MEALS Gollan, Tollie Pizza, MD Taking Active   docusate sodium  (COLACE) 100 MG capsule [id number] No Take 100 mg by mouth daily as needed for mild constipation. [provider]  Taking Active   gabapentin (NEURONTIN) 100 MG capsule [id number] No Take 1 capsule (100 mg total) by mouth at bedtime. Aura Dials T, NP Taking Active   insulin lispro (HUMALOG) 100 UNIT/ML injection [id number] No Inject 8 Units into the skin 3 (three) times daily before meals. [provider] Taking Active   meclizine (ANTIVERT) 12.5 MG tablet [id number] No Take 12.5 mg by mouth 3 (three) times daily. [provider] Taking Active   methocarbamol (ROBAXIN) 500 MG tablet [id number] No Take 1 tablet (500 mg total) by mouth daily as needed for muscle spasms. Edward Jolly, MD Taking Active   naloxone [facility]) nasal spray 4 mg/0.1 mL [id number] No SMARTSIG:Both Nares [provider] Taking Active   NOVOFINE 32G X 6 MM MISC [id number] No USE AS DIRECTED THREE TIMES DAILY WITH HUMALOG Cannady, [name] T, NP Taking Active   olmesartan (BENICAR) 40 MG tablet [id number] No Take 1 tablet (40 mg total) by mouth daily.  Patient taking differently: Take 40 mg by mouth every morning.   Aura Dials T, NP Taking Active Self  pantoprazole (PROTONIX) 40 MG tablet [id number] No Take 1 tablet (40 mg total) by mouth daily. P[place]on, [place] F, MD Taking Active   pioglitazone (ACTOS) 30 MG tablet [id number] No Take 30 mg by mouth daily. [provider] Taking Active   rosuvastatin (CRESTOR) 5 MG tablet [id number] No Take 1 tablet (5 mg total) by mouth daily.  Patient taking differently: Take 5 mg by mouth every morning.   Antonieta Iba, MD Taking Active Self  torsemide (DEMADEX) 20 MG tablet [id number] No Take 2 tablets (40 mg total) by mouth daily.  Patient taking differently: Take 40 mg by mouth every morning.   Antonieta Iba, MD Taking Active Self  vitamin B-12 (CYANOCOBALAMIN) 100 MCG tablet [id number] No Take 100 mcg by mouth daily. [provider]  Taking Active   Vitamin D, Cholecalciferol, 50 MCG (2000 UT) CAPS [id number] No Take 2,000 Units by mouth daily. [provider] Taking Active   Med List Note Valerie Salts, RN 04/18/20 1437): MR 06-17-2020 UDS 04-18-2020 Med agreement signed 04-18-2020            SDOH:  (Social Determinants of Health) assessments and interventions performed: Yes SDOH Interventions    Flowsheet Row Care Coordination from 04/13/2023 in [facility] Clinical Support from 02/17/2023 in [facility] Telephone from 12/10/2022 in [facility] Coordination Chronic Care Management from 11/18/2022 in [facility] Chronic Care Management from 10/13/2022 in [facility] Clinical Support from 11/12/2021 in [facility]  SDOH Interventions        Food Insecurity Interventions -- Intervention Not Indicated Intervention Not Indicated Intervention Not Indicated -- Intervention Not Indicated  Housing Interventions -- Intervention Not Indicated Intervention Not Indicated Intervention Not Indicated -- Intervention Not Indicated  Transportation Interventions Intervention Not Indicated Intervention Not Indicated Intervention Not Indicated Intervention Not Indicated Intervention Not Indicated Intervention Not Indicated  Utilities Interventions -- Intervention Not Indicated -- Intervention Not Indicated -- --  Alcohol Usage Interventions -- Intervention Not Indicated (Score <7) -- Intervention Not Indicated (Score <7) -- --  Financial Strain Interventions Other (Comment)  [Ozempic PAP] Intervention Not Indicated -- Intervention Not Indicated Intervention Not Indicated Intervention Not Indicated  Physical Activity Interventions -- Patient Refused -- Other (Comments)  [no structured activity, encouraged activity] -- Intervention Not Indicated  Stress Interventions -- Intervention Not Indicated --  Intervention Not Indicated -- Intervention Not Indicated  Social Connections Interventions -- Intervention Not Indicated -- Intervention Not Indicated -- Intervention Not Indicated       Medication Assistance:   Ozempic: -2024: Start in April   Name and location of Current pharmacy:  [id number] [place], [place] - [address] AT [facility] OF [address] [place] [address] [place] [address] Phone: [phone] Fax: [phone]  [facility]) - [place], [place] - [address] [facility] [address] [place] [address] [place] [address] Phone: [phone] Fax: [phone]  [facility] - [place], [place] - [address] [place] [address] Phone: [phone] Fax: [phone]  [facility] reviewed with patient today?: No  Patient requests to transfer care to [facility]?:    Compliance/Adherence/Medication fill history: Care Gaps: Annual wellness visit in last year? No   Star Rating Drugs:  Olmesartan 40 mg Last filled:01/23/23 100 DS, 10/30/22 90 DS Rosuvastatin 5 mg Last filled:01/23/23 100 DS, 10/30/22 90 DS   Assessment/Plan  Heart Failure (Goal: manage symptoms and prevent exacerbations) -Controlled -Last ejection fraction:  Echocardiogram December 2020  ejection fraction 60 to 65% -HF type: Diastolic -NYHA Class: III (marked limitation of activity) -AHA HF Stage: C (Heart disease and symptoms present) -Current treatment: Carvedilol 3.125mg  BID Appropriate, Effective, Safe, Accessible Olmesartan 40mg  Appropriate, Effective, Safe, Accessible Torsemide 40mg  Appropriate, Effective, Safe, Accessible Pioglitazone Query Appropriate,  -Medications previously tried: N/A  -Current home BP/HR readings: Doesn't test -Current dietary habits: "Tries to eat healthy" -Current exercise habits: None -Educated on Benefits of medications for managing  symptoms and prolonging life October 2023: Will ask Endo to DC pioglitazone April 2024: Patient never called French Ana back when we tried to replace Pioglitazone with Ozempic. Patient actually hung up on [place]. Counseled patient to not do this and will try again (After asking Dr. Jayme Cloud first)     Hyperlipidemia: (LDL goal < 70) The ASCVD Risk score (Arnett DK, et al., 2019) failed to calculate for the following reasons:   The patient has a prior MI or stroke diagnosis Lab Results  Component Value Date   CHOL 184 12/22/2022   CHOL 177 08/05/2022   CHOL 176 01/29/2022   Lab Results  Component Value Date   HDL 87 12/22/2022   HDL 76 08/05/2022   HDL 85 01/29/2022   Lab Results  Component Value Date   LDLCALC 80 12/22/2022   LDLCALC 83 08/05/2022   LDLCALC 76 01/29/2022   Lab Results  Component Value Date   TRIG 94 12/22/2022   TRIG 101 08/05/2022   TRIG 84 01/29/2022   Lab Results  Component Value Date   CHOLHDL 2.5 12/02/2019   CHOLHDL 2.0 12/30/2018   Lab Results  Component Value Date   LDLDIRECT 67 12/30/2018   Last vitamin D Lab Results  Component Value Date   VD25OH 22.7 (L) 12/22/2022   Lab Results  Component Value Date   TSH 2.220 12/22/2022   -Not ideally controlled -Current treatment: Rosuvastatin 5mg  QD Appropriate, Effective, Safe, Accessible -Medications previously tried: Atorvastatin  -Current dietary patterns: "Tries to eat healthy" -Current exercise habits: None -Educated on Cholesterol goals;  October 2023: Would like to get patient off renal statin and on liver one. However, documented Hx of myalgias and Rosuvastatin dose is WNL -Recommended to continue current medication   Diabetes (A1c goal <8%) Lab Results  Component Value Date   HGBA1C 8.1 (H) 08/26/2022   HGBA1C 8.2 (H) 01/29/2021   HGBA1C 7.9 (H) 10/25/2020   Lab Results  Component Value Date   MICROALBUR 150 (H) 12/22/2022   LDLCALC 80 12/22/2022   CREATININE 2.80 (H)  01/27/2023    Lab Results  Component Value Date   NA 143 12/22/2022   K 5.0 12/22/2022   CREATININE 2.80 (H) 01/27/2023   EGFR 23 (L) 12/22/2022   GFRNONAA 18 (L) 08/27/2022   GLUCOSE 217 (H) 12/22/2022    Lab Results  Component Value Date   WBC 6.5 03/04/2023   HGB 9.6 (L) 04/01/2023   HCT 30.9 (L) 04/01/2023   MCV 90.5 03/04/2023   PLT 245 03/04/2023    Lab Results  Component Value Date   MICROALBUR 150 (H) 12/22/2022   MICROALBUR 150 (H) 01/29/2021  -Uncontrolled -Current medications: Humalog 6-10 units TID Appropriate, Query effective,  Pioglitazone Query Appropriate, Query effective,  -Medications previously tried: N/A  -Current home glucose readings fasting glucose:  October 2023: Unable to view CGM on my computer post prandial glucose:  -Denies hypoglycemic/hyperglycemic symptoms -Current exercise: None -Educated on A1c and blood sugar goals; -Counseled to check feet daily and get yearly eye exams October 2023: Will ask Endo to stop Pioglitazone. Their note mentioned GLP1 as an option but unable to due to cost. Will ask if still an option and if so, CCM team will do PAP April 2024: Patient never called French Ana back when we tried to replace Pioglitazone with Ozempic. Patient actually hung up on [place]. Counseled patient to not do this and will try again (After asking Dr. Jayme Cloud first) -Patient skipping Humalog 1/week due to forgetfullness and skips evening Humalog constantly (Per patient) due to fears of hypoglycemia. I asked when the last time she got "Low" was and she stated "It's been so long I can't remember." Explained dangers of hyperglycemia, especially regarding kidneys, and she agreed to take evening insulin. She also agreed to call me if she drops low   CP F/U June 2024  Artelia Laroche, [place].D. - [phone]

## 2023-04-14 ENCOUNTER — Telehealth: Payer: Self-pay

## 2023-04-14 DIAGNOSIS — Z794 Long term (current) use of insulin: Secondary | ICD-10-CM | POA: Diagnosis not present

## 2023-04-14 DIAGNOSIS — E1159 Type 2 diabetes mellitus with other circulatory complications: Secondary | ICD-10-CM

## 2023-04-14 DIAGNOSIS — I110 Hypertensive heart disease with heart failure: Secondary | ICD-10-CM

## 2023-04-14 DIAGNOSIS — I5030 Unspecified diastolic (congestive) heart failure: Secondary | ICD-10-CM

## 2023-04-14 NOTE — Progress Notes (Cosign Needed)
Care Management & Coordination Services Pharmacy Team  Reason for Encounter: Patient assistance    Currently enrolled patient in [employer] patient assistance program for the medication Ozempic. Prefilled application for patient. Application will be mailed out to the patient 04/17/23 to address in chart.Patient will complete highlighted sections and return to CFP.  Called patient left message for patient to return call for instructions.  Velvet Bathe

## 2023-04-16 DIAGNOSIS — M5451 Vertebrogenic low back pain: Secondary | ICD-10-CM | POA: Diagnosis not present

## 2023-04-16 DIAGNOSIS — M5136 Other intervertebral disc degeneration, lumbar region: Secondary | ICD-10-CM | POA: Diagnosis not present

## 2023-04-16 DIAGNOSIS — M791 Myalgia, unspecified site: Secondary | ICD-10-CM | POA: Diagnosis not present

## 2023-04-16 DIAGNOSIS — M533 Sacrococcygeal disorders, not elsewhere classified: Secondary | ICD-10-CM | POA: Diagnosis not present

## 2023-04-23 DIAGNOSIS — E875 Hyperkalemia: Secondary | ICD-10-CM | POA: Diagnosis not present

## 2023-04-23 DIAGNOSIS — N184 Chronic kidney disease, stage 4 (severe): Secondary | ICD-10-CM | POA: Diagnosis not present

## 2023-04-23 DIAGNOSIS — D631 Anemia in chronic kidney disease: Secondary | ICD-10-CM | POA: Diagnosis not present

## 2023-04-23 DIAGNOSIS — I129 Hypertensive chronic kidney disease with stage 1 through stage 4 chronic kidney disease, or unspecified chronic kidney disease: Secondary | ICD-10-CM | POA: Diagnosis not present

## 2023-04-23 DIAGNOSIS — R809 Proteinuria, unspecified: Secondary | ICD-10-CM | POA: Diagnosis not present

## 2023-04-23 DIAGNOSIS — E1122 Type 2 diabetes mellitus with diabetic chronic kidney disease: Secondary | ICD-10-CM | POA: Diagnosis not present

## 2023-04-29 ENCOUNTER — Inpatient Hospital Stay: Payer: Medicare Other | Attending: Oncology

## 2023-04-29 ENCOUNTER — Inpatient Hospital Stay: Payer: Medicare Other

## 2023-04-29 DIAGNOSIS — Z87891 Personal history of nicotine dependence: Secondary | ICD-10-CM | POA: Diagnosis not present

## 2023-04-29 DIAGNOSIS — D72820 Lymphocytosis (symptomatic): Secondary | ICD-10-CM | POA: Diagnosis not present

## 2023-04-29 DIAGNOSIS — D631 Anemia in chronic kidney disease: Secondary | ICD-10-CM

## 2023-04-29 DIAGNOSIS — N184 Chronic kidney disease, stage 4 (severe): Secondary | ICD-10-CM | POA: Diagnosis not present

## 2023-04-29 LAB — HEMOGLOBIN AND HEMATOCRIT, BLOOD
HCT: 35.5 % — ABNORMAL LOW (ref 36.0–46.0)
Hemoglobin: 10.7 g/dL — ABNORMAL LOW (ref 12.0–15.0)

## 2023-04-29 NOTE — Progress Notes (Signed)
Hbg 10.7. No retacrit today

## 2023-05-04 ENCOUNTER — Ambulatory Visit: Payer: Medicare Other | Admitting: Surgery

## 2023-05-15 DIAGNOSIS — E1165 Type 2 diabetes mellitus with hyperglycemia: Secondary | ICD-10-CM | POA: Diagnosis not present

## 2023-05-21 DIAGNOSIS — E1129 Type 2 diabetes mellitus with other diabetic kidney complication: Secondary | ICD-10-CM | POA: Diagnosis not present

## 2023-05-21 DIAGNOSIS — E113393 Type 2 diabetes mellitus with moderate nonproliferative diabetic retinopathy without macular edema, bilateral: Secondary | ICD-10-CM | POA: Diagnosis not present

## 2023-05-21 DIAGNOSIS — R809 Proteinuria, unspecified: Secondary | ICD-10-CM | POA: Diagnosis not present

## 2023-05-21 DIAGNOSIS — Z794 Long term (current) use of insulin: Secondary | ICD-10-CM | POA: Diagnosis not present

## 2023-05-21 DIAGNOSIS — E1121 Type 2 diabetes mellitus with diabetic nephropathy: Secondary | ICD-10-CM | POA: Diagnosis not present

## 2023-05-21 DIAGNOSIS — N184 Chronic kidney disease, stage 4 (severe): Secondary | ICD-10-CM | POA: Diagnosis not present

## 2023-05-21 DIAGNOSIS — E1122 Type 2 diabetes mellitus with diabetic chronic kidney disease: Secondary | ICD-10-CM | POA: Diagnosis not present

## 2023-05-27 ENCOUNTER — Inpatient Hospital Stay: Payer: Medicare Other

## 2023-05-27 ENCOUNTER — Inpatient Hospital Stay: Payer: Medicare Other | Attending: Oncology

## 2023-05-27 DIAGNOSIS — D631 Anemia in chronic kidney disease: Secondary | ICD-10-CM | POA: Diagnosis not present

## 2023-05-27 DIAGNOSIS — N184 Chronic kidney disease, stage 4 (severe): Secondary | ICD-10-CM | POA: Diagnosis not present

## 2023-05-27 LAB — HEMOGLOBIN AND HEMATOCRIT, BLOOD
HCT: 31.8 % — ABNORMAL LOW (ref 36.0–46.0)
Hemoglobin: 9.8 g/dL — ABNORMAL LOW (ref 12.0–15.0)

## 2023-05-27 NOTE — Progress Notes (Signed)
bp -184/85; 180/86. pt did not take her bp meds this morning. She took the bp meds later in afternoon yesterday b/c she forgot to take them yesterday am. She did not want to take too early this morning for this reason.  Dr. Cathie Hoops. Notified of bp readings. Per Dr. Cathie Hoops. Pt needs to return either at the end of the week or Monday for her injection. Hgb 9.8 today. We can use these labs per md, unless apt coming in. I have instructed pt to coordinate her apts around her bp meds. She needs to take her bp meds a few hours prior to coming into the clinic. Pt gave verbal understanding of the plan of care.

## 2023-06-01 ENCOUNTER — Inpatient Hospital Stay: Payer: Medicare Other

## 2023-06-01 ENCOUNTER — Other Ambulatory Visit: Payer: Self-pay

## 2023-06-01 VITALS — BP 125/72

## 2023-06-01 DIAGNOSIS — D631 Anemia in chronic kidney disease: Secondary | ICD-10-CM | POA: Diagnosis not present

## 2023-06-01 DIAGNOSIS — N184 Chronic kidney disease, stage 4 (severe): Secondary | ICD-10-CM | POA: Diagnosis not present

## 2023-06-01 LAB — HEMOGLOBIN AND HEMATOCRIT (CANCER CENTER ONLY)
HCT: 30.7 % — ABNORMAL LOW (ref 36.0–46.0)
Hemoglobin: 9.7 g/dL — ABNORMAL LOW (ref 12.0–15.0)

## 2023-06-01 MED ORDER — EPOETIN ALFA-EPBX 10000 UNIT/ML IJ SOLN
10000.0000 [IU] | Freq: Once | INTRAMUSCULAR | Status: AC
  Administered 2023-06-01: 10000 [IU] via SUBCUTANEOUS
  Filled 2023-06-01: qty 1

## 2023-06-09 ENCOUNTER — Telehealth: Payer: Medicare Other

## 2023-06-09 ENCOUNTER — Telehealth: Payer: Self-pay

## 2023-06-09 NOTE — Telephone Encounter (Signed)
   CCM RN Visit Note   06-09-2023 Name: Tonya [name] MRN: [id number]      DOB: [birth year]  Subjective: Tonya [name] is a 77 y.o. year old female who is a primary care patient of @PCP . The patient was referred to the Chronic Care Management team for assistance with care management needs subsequent to provider initiation of CCM services and plan of care.      An unsuccessful telephone outreach was attempted today to contact the patient about Chronic Care Management needs.    Plan:A HIPAA compliant phone message was left for the patient providing contact information and requesting a return call.  Alto Denver RN, MSN, CCM RN Care Manager  Chronic Care Management Direct Number: [phone]

## 2023-06-13 ENCOUNTER — Other Ambulatory Visit: Payer: Self-pay | Admitting: Cardiovascular Disease

## 2023-06-13 NOTE — Patient Instructions (Signed)
Be Involved in Your Health Care:  Taking Medications When medications are taken as directed, they can greatly improve your health. But if they are not taken as instructed, they may not work. In some cases, not taking them correctly can be harmful. To help ensure your treatment remains effective and safe, understand your medications and how to take them.  Your lab results, notes and after visit summary will be available on My Chart. We strongly encourage you to use this feature. If lab results are abnormal the clinic will contact you with the appropriate steps. If the clinic does not contact you assume the results are satisfactory. You can always see your results on My Chart. If you have questions regarding your condition, please contact the clinic during office hours. You can also ask questions on My Chart.  We at [facility] are grateful that you chose us to provide care. We strive to provide excellent and compassionate care and are always looking for feedback. If you get a survey from the clinic please complete this.   Diabetes Mellitus Basics  Diabetes mellitus, or diabetes, is a long-term (chronic) disease. It occurs when the body does not properly use sugar (glucose) that is released from food after you eat. Diabetes mellitus may be caused by one or both of these problems: Your pancreas does not make enough of a hormone called insulin. Your body does not react in a normal way to the insulin that it makes. Insulin lets glucose enter cells in your body. This gives you energy. If you have diabetes, glucose cannot get into cells. This causes high blood glucose (hyperglycemia). How to treat and manage diabetes You may need to take insulin or other diabetes medicines daily to keep your glucose in balance. If you are prescribed insulin, you will learn how to give yourself insulin by injection. You may need to adjust the amount of insulin you take based on the foods that you eat. You will  need to check your blood glucose levels using a glucose monitor as told by your health care provider. The readings can help determine if you have low or high blood glucose. Generally, you should have these blood glucose levels: Before meals (preprandial): 80-130 mg/dL (4.4-7.2 mmol/L). After meals (postprandial): below 180 mg/dL (10 mmol/L). Hemoglobin A1c (HbA1c) level: less than 7%. Your health care provider will set treatment goals for you. Keep all follow-up visits. This is important. Follow these instructions at home: Diabetes medicines Take your diabetes medicines every day as told by your health care provider. List your diabetes medicines here: Name of medicine: ______________________________ Amount (dose): _______________ Time (a.m./p.m.): _______________ Notes: ___________________________________ Name of medicine: ______________________________ Amount (dose): _______________ Time (a.m./p.m.): _______________ Notes: ___________________________________ Name of medicine: ______________________________ Amount (dose): _______________ Time (a.m./p.m.): _______________ Notes: ___________________________________ Insulin If you use insulin, list the types of insulin you use here: Insulin type: ______________________________ Amount (dose): _______________ Time (a.m./p.m.): _______________Notes: ___________________________________ Insulin type: ______________________________ Amount (dose): _______________ Time (a.m./p.m.): _______________ Notes: ___________________________________ Insulin type: ______________________________ Amount (dose): _______________ Time (a.m./p.m.): _______________ Notes: ___________________________________ Insulin type: ______________________________ Amount (dose): _______________ Time (a.m./p.m.): _______________ Notes: ___________________________________ Insulin type: ______________________________ Amount (dose): _______________ Time (a.m./p.m.): _______________  Notes: ___________________________________ Managing blood glucose  Check your blood glucose levels using a glucose monitor as told by your health care provider. Write down the times that you check your glucose levels here: Time: _______________ Notes: ___________________________________ Time: _______________ Notes: ___________________________________ Time: _______________ Notes: ___________________________________ Time: _______________ Notes: ___________________________________ Time: _______________ Notes: ___________________________________ Time: _______________ Notes: ___________________________________    Low blood glucose Low blood glucose (hypoglycemia) is when glucose is at or below 70 mg/dL (3.9 mmol/L). Symptoms may include: Feeling: Hungry. Sweaty and clammy. Irritable or easily upset. Dizzy. Sleepy. Having: A fast heartbeat. A headache. A change in your vision. Numbness around the mouth, lips, or tongue. Having trouble with: Moving (coordination). Sleeping. Treating low blood glucose To treat low blood glucose, eat or drink something containing sugar right away. If you can think clearly and swallow safely, follow the 15:15 rule: Take 15 grams of a fast-acting carb (carbohydrate), as told by your health care provider. Some fast-acting carbs are: Glucose tablets: take 3-4 tablets. Hard candy: eat 3-5 pieces. Fruit juice: drink 4 oz (120 mL). Regular (not diet) soda: drink 4-6 oz (120-180 mL). Honey or sugar: eat 1 Tbsp (15 mL). Check your blood glucose levels 15 minutes after you take the carb. If your glucose is still at or below 70 mg/dL (3.9 mmol/L), take 15 grams of a carb again. If your glucose does not go above 70 mg/dL (3.9 mmol/L) after 3 tries, get help right away. After your glucose goes back to normal, eat a meal or a snack within 1 hour. Treating very low blood glucose If your glucose is at or below 54 mg/dL (3 mmol/L), you have very low blood glucose  (severe hypoglycemia). This is an emergency. Do not wait to see if the symptoms will go away. Get medical help right away. Call your local emergency services (911 in the [place] Do not drive yourself to the hospital. Questions to ask your health care provider Should I talk with a diabetes educator? What equipment will I need to care for myself at home? What diabetes medicines do I need? When should I take them? How often do I need to check my blood glucose levels? What number can I call if I have questions? When is my follow-up visit? Where can I find a support group for people with diabetes? Where to find more information American Diabetes Association: www.diabetes.org Association of Diabetes Care and Education Specialists: www.diabeteseducator.org Contact a health care provider if: Your blood glucose is at or above 240 mg/dL (13.3 mmol/L) for 2 days in a row. You have been sick or have had a fever for 2 days or more, and you are not getting better. You have any of these problems for more than 6 hours: You cannot eat or drink. You feel nauseous. You vomit. You have diarrhea. Get help right away if: Your blood glucose is lower than 54 mg/dL (3 mmol/L). You get confused. You have trouble thinking clearly. You have trouble breathing. These symptoms may represent a serious problem that is an emergency. Do not wait to see if the symptoms will go away. Get medical help right away. Call your local emergency services (911 in the [place] Do not drive yourself to the hospital. Summary Diabetes mellitus is a chronic disease that occurs when the body does not properly use sugar (glucose) that is released from food after you eat. Take insulin and diabetes medicines as told. Check your blood glucose every day, as often as told. Keep all follow-up visits. This is important. This information is not intended to replace advice given to you by your health care provider. Make sure you discuss any  questions you have with your health care provider. Document Revised: 04/03/2020 Document Reviewed: 04/03/2020 Elsevier Patient Education  2024 [employer]vier Inc.  

## 2023-06-15 ENCOUNTER — Ambulatory Visit (INDEPENDENT_AMBULATORY_CARE_PROVIDER_SITE_OTHER): Payer: Medicare Other | Admitting: Nurse Practitioner

## 2023-06-15 ENCOUNTER — Encounter: Payer: Self-pay | Admitting: Nurse Practitioner

## 2023-06-15 VITALS — BP 138/64 | HR 64 | Ht 67.0 in | Wt 139.6 lb

## 2023-06-15 DIAGNOSIS — D631 Anemia in chronic kidney disease: Secondary | ICD-10-CM

## 2023-06-15 DIAGNOSIS — E785 Hyperlipidemia, unspecified: Secondary | ICD-10-CM

## 2023-06-15 DIAGNOSIS — E1169 Type 2 diabetes mellitus with other specified complication: Secondary | ICD-10-CM

## 2023-06-15 DIAGNOSIS — E1140 Type 2 diabetes mellitus with diabetic neuropathy, unspecified: Secondary | ICD-10-CM | POA: Diagnosis not present

## 2023-06-15 DIAGNOSIS — E1165 Type 2 diabetes mellitus with hyperglycemia: Secondary | ICD-10-CM

## 2023-06-15 DIAGNOSIS — I130 Hypertensive heart and chronic kidney disease with heart failure and stage 1 through stage 4 chronic kidney disease, or unspecified chronic kidney disease: Secondary | ICD-10-CM | POA: Diagnosis not present

## 2023-06-15 DIAGNOSIS — N2581 Secondary hyperparathyroidism of renal origin: Secondary | ICD-10-CM

## 2023-06-15 DIAGNOSIS — Z794 Long term (current) use of insulin: Secondary | ICD-10-CM | POA: Diagnosis not present

## 2023-06-15 DIAGNOSIS — N184 Chronic kidney disease, stage 4 (severe): Secondary | ICD-10-CM

## 2023-06-15 DIAGNOSIS — B0229 Other postherpetic nervous system involvement: Secondary | ICD-10-CM

## 2023-06-15 DIAGNOSIS — I5032 Chronic diastolic (congestive) heart failure: Secondary | ICD-10-CM

## 2023-06-15 NOTE — Assessment & Plan Note (Signed)
Chronic with poor tolerance to Gabapentin higher doses in past and Lyrica at doses >25 MG.  We will continue lowest dose of Gabapentin 100 MG at night only, educated her at length on this.  Continue to follow with pain management and ortho, appreciate their input.  She is aware if any side effects to immediately stop taking and alert provider.

## 2023-06-15 NOTE — Assessment & Plan Note (Signed)
Chronic, ongoing.  Continue Rosuvastatin. Continue to collaborate with [facility].  Lipid panel today. 

## 2023-06-15 NOTE — Assessment & Plan Note (Signed)
Chronic, ongoing.  Continue current medication regimen and collaboration with nephrology. Labs up to date and reviewed in Care Everywhere.  

## 2023-06-15 NOTE — Assessment & Plan Note (Addendum)
Chronic, ongoing, followed by endocrinology.  A1c 8.5% with endo last check and urine ALB 150 January 2024. Continue current medication regimen and defer changes and A1c check to endo.  Cardiology and PCP have recommended to endo discontinuing Actos due to her HF.  Continue to collaborate with Dr. Tedd Sias and CCM team. Recommend she monitor BS at least 3 times a day at home, continue consistent [place] use.  Return in 6 months as is being followed closely by endo.

## 2023-06-15 NOTE — Assessment & Plan Note (Signed)
Chronic, ongoing.  Monitor closely due to AM low BS.  Continue collaboration with endocrinology. 

## 2023-06-15 NOTE — Assessment & Plan Note (Signed)
Chronic, stable.  BP at goal in office today, ?accuracy of BP at dentist as recent levels have been stable and home BP at goal.  Continue current medication regimen and collaboration with nephrology.  Labs: up to date.  Urine ALB 150 January 2024.  Recommend she continue to monitor BP at home daily and document for providers + focus on DASH diet.  Continue collaboration with cardiology and nephrology.

## 2023-06-15 NOTE — Assessment & Plan Note (Addendum)
Chronic, ongoing, followed by nephrology with recent labs obtained.  Continue collaboration with nephrology.   Recent note and labs reviewed.  Check Vit D today.

## 2023-06-15 NOTE — Assessment & Plan Note (Signed)
Chronic, ongoing with EF 60-65%, euvolemic at this time.  Continue current medication regimen and collaboration with cardiology + CCM team in office.  - Reminded to call for an overnight weight gain of >2 pounds or a weekly weight weight of >5 pounds -- work with CCM team on obtaining scale. - not adding salt to his food and has been reading food labels. Reviewed the importance of keeping daily sodium intake to <2000mg daily  - Wear compression hose daily -- not on today and not consistently wearing - No Ibuprofen at home 

## 2023-06-15 NOTE — Progress Notes (Signed)
BP 138/64 (BP Location: Left Arm, Patient Position: Sitting, Cuff Size: Normal)   Pulse 64   Ht 5\' 7"  (1.702 m)   Wt 139 lb 9.6 oz (63.3 kg)   LMP  (LMP Unknown)   SpO2 99%   BMI 21.86 kg/m    Subjective:    Patient ID: [name], female    DOB: 29-Jul-1946, 77 y.o.   MRN: [id number]  HPI: MARYLISA KALIVAS is a 77 y.o. female  Chief Complaint  Patient presents with   Diabetes   Hyperlipidemia   Hypertension    Patient says she was supposed to have a dental procedure and her BP was elevated, so she was unable to get her dental procedure. Patient says it was 200/90 and they had to reschedule the procedure.    Anemia   Chronic Kidney Disease   DIABETES Saw endocrinology on 05/21/23 with A1c 8.5% April, no changes made.   Currently taking Humalog (8 units) and Actos.  Freestyle in place = did not bring meter today.  Previously has tried Toujeo and Trulicity -- did not tolerate these.   Polydipsia/polyuria: no Visual disturbance: no Chest pain: no Paresthesias: no Glucose Monitoring: yes             Accucheck frequency: TID              Fasting glucose: this morning 90 range (have been 90 to 100 range)             Post prandial:             Evening:              Before meals:              Taking Insulin?: yes             Long acting insulin: None, endo stopped             Short acting insulin: Humalog 8 units in morning and then 8 units at lunch and dinner Blood Pressure Monitoring: a few times a week Retinal Examination: Up to Date -- [facility]  Foot Exam: Up to Date Pneumovax: Up to Date Influenza: Up to Date Aspirin: yes    HYPERTENSION / HYPERLIPIDEMIA/HF Follows with cardiology, Dr. Mariah Milling, last visit 07/01/22, no changes made. Continues on Carvedilol, Plavix, Torsemide, Olmesartan, and Crestor.  Had recent elevation at dentist to 200/90 she reports via machine with arm check. BP with nephrology recently 121/79 and endocrinology 126/74.  LVEF on 12/02/2019  was 60 to 65% with moderate LVH, and grade 1 diastolic dysfunction. Satisfied with current treatment? yes Duration of hypertension: chronic BP monitoring frequency: a few times a week BP range: 134/80 on recent home check BP medication side effects: no Duration of hyperlipidemia: chronic Cholesterol medication side effects: no Cholesterol supplements: none Medication compliance: good compliance Aspirin: yes Recent stressors: no Recurrent headaches: no Visual changes: no Palpitations: no Dyspnea: no Chest pain: no Lower extremity edema: at baseline, small amount Dizzy/lightheaded: no    CHRONIC KIDNEY DISEASE Followed by nephrology and last saw 04/23/23.  Hematology for anemia in CKD, last seen 03/04/23 -- and infusion last 06/01/23.  CRT 2.64, eGFR 18, PTH 108. CKD status: stable Medications renally dose: yes Previous renal evaluation: yes Pneumovax:  Up to Date Influenza Vaccine:  Up to Date   POSTHERPETIC NEURALGIA Started Gabapentin 100 MG at bedtime on 02/17/23.  Has had no side effects with this, including no slurred speech issues.  Has seen pain clinic for this with injections in past, last visit 04/16/23 -- saw [facility] at the time who reported they could do surgery but there was no guarantee.  Previously took Gabapentin back when first had shingles in 2008 or 2009, but was higher dose and caused speech issues per patient, Lyrica also caused side effects.   Has known degenerative disc to spine.  Gabapentin helps a little bit and helps her sleep. Neuropathy status: stable Satisfied with current treatment?: no Location: right hip and mid back Pain: yes Severity: 10/10 at worst when gets up to go to bathroom Quality:  dull, aching, and throbbing Frequency: intermittent Bilateral: no Symmetric: no Numbness: yes Decreased sensation: no Weakness: no Context: fluctuating Alleviating factors: nothing Aggravating factors: movement Treatments attempted: injections, multiple  medications = Gabapentin, Lyrica, Lidocaine patches, Capsaicin cream, Tylenol  Relevant past medical, surgical, family and social history reviewed and updated as indicated. Interim medical history since our last visit reviewed. Allergies and medications reviewed and updated.  Review of Systems  Constitutional:  Negative for activity change, appetite change, diaphoresis, fatigue and fever.  Respiratory:  Negative for cough, chest tightness, shortness of breath and wheezing.   Cardiovascular:  Negative for chest pain, palpitations and leg swelling.  Gastrointestinal: Negative.   Musculoskeletal:  Positive for arthralgias.  Neurological: Negative.   Psychiatric/Behavioral: Negative.      Per HPI unless specifically indicated above     Objective:    BP 138/64 (BP Location: Left Arm, Patient Position: Sitting, Cuff Size: Normal)   Pulse 64   Ht 5\' 7"  (1.702 m)   Wt 139 lb 9.6 oz (63.3 kg)   LMP  (LMP Unknown)   SpO2 99%   BMI 21.86 kg/m   Wt Readings from Last 3 Encounters:  06/15/23 139 lb 9.6 oz (63.3 kg)  03/04/23 145 lb 6.4 oz (66 kg)  02/25/23 145 lb 3.2 oz (65.9 kg)    Physical Exam Vitals and nursing note reviewed.  Constitutional:      General: She is awake. She is not in acute distress.    Appearance: She is well-developed. She is not ill-appearing.  HENT:     Head: Normocephalic.     Right Ear: Hearing normal.     Left Ear: Hearing normal.  Eyes:     General: Lids are normal.        Right eye: No discharge.        Left eye: No discharge.     Conjunctiva/sclera: Conjunctivae normal.     Pupils: Pupils are equal, round, and reactive to light.  Neck:     Thyroid: No thyromegaly.     Vascular: No carotid bruit.  Cardiovascular:     Rate and Rhythm: Normal rate and regular rhythm.     Heart sounds: Normal heart sounds. No murmur heard.    No gallop.  Pulmonary:     Effort: Pulmonary effort is normal. No accessory muscle usage or respiratory distress.      Breath sounds: Normal breath sounds.  Abdominal:     General: Bowel sounds are normal. There is no distension.     Palpations: Abdomen is soft.     Tenderness: There is no abdominal tenderness.  Musculoskeletal:     Cervical back: Normal range of motion and neck supple.     Right lower leg: Edema (trace) present.     Left lower leg: Edema (trace) present.  Lymphadenopathy:     Cervical: No cervical adenopathy.  Skin:  General: Skin is warm and dry.     Findings: No rash.  Neurological:     Mental Status: She is alert and oriented to person, place, and time.  Psychiatric:        Attention and Perception: Attention normal.        Mood and Affect: Mood normal.        Behavior: Behavior normal. Behavior is cooperative.        Thought Content: Thought content normal.        Judgment: Judgment normal.    Results for orders placed or performed in visit on 06/01/23  Hemoglobin and Hematocrit (Cancer Center Only)  Result Value Ref Range   Hemoglobin 9.7 (L) 12.0 - 15.0 g/dL   HCT 16.1 (L) 09.6 - 04.5 %      Assessment & Plan:   Problem List Items Addressed This Visit       Cardiovascular and Mediastinum   Chronic heart failure with preserved ejection fraction (HFpEF) (HCC)    Chronic, ongoing with EF 60-65%, euvolemic at this time.  Continue current medication regimen and collaboration with cardiology + CCM team in office.  - Reminded to call for an overnight weight gain of >2 pounds or a weekly weight weight of >5 pounds -- work with CCM team on obtaining scale. - not adding salt to his food and has been reading food labels. 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  BP at goal in office today, ?accuracy of BP at dentist as recent levels have been stable and home BP  at goal.  Continue current medication regimen and collaboration with nephrology.  Labs: up to date.  Urine ALB 150 January 2024.  Recommend she continue to monitor BP at home daily and document for providers + focus on DASH diet.  Continue collaboration with cardiology and nephrology.          Endocrine   Hyperlipidemia associated with type 2 diabetes mellitus (HCC)    Chronic, ongoing.  Continue Rosuvastatin. Continue to collaborate with [facility].   Lipid panel today.      Relevant Orders   Lipid Panel w/o Chol/HDL Ratio   Hyperparathyroidism due to renal insufficiency (HCC)    Chronic, ongoing, followed by nephrology with recent labs obtained.  Continue collaboration with nephrology.   Recent note and labs reviewed.  Check Vit D today.      Relevant Orders   VITAMIN D 25 Hydroxy (Vit-D Deficiency, Fractures)   Poorly controlled type 2 diabetes mellitus with neuropathy (HCC) - Primary    Chronic, ongoing, followed by endocrinology.  A1c 8.5% with endo last check and urine ALB 150 January 2024. Continue current medication regimen and defer changes and A1c check to endo.  Cardiology and PCP have recommended to endo discontinuing Actos due to her HF.  Continue to collaborate with Dr. Tedd Sias and CCM team. Recommend she monitor BS at least 3 times a day at home, continue consistent [place] use.  Return in 6 months as is being followed closely by endo.        Nervous and Auditory   Post herpetic neuralgia    Chronic with poor tolerance to Gabapentin higher doses in past and Lyrica at doses >25 MG.  We will  continue lowest dose of Gabapentin 100 MG at night only, educated her at length on this.  Continue to follow with pain management and ortho, appreciate their input.  She is aware if any side effects to immediately stop taking and alert provider.        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  Continue collaboration with hematology  and infusions.  Recent notes reviewed.      Chronic kidney disease, stage 4, severely decreased GFR (HCC)    Chronic, ongoing.  Continue current medication regimen and collaboration with nephrology. Labs up to date and reviewed in Care Everywhere.         Other   Encounter for long-term (current) use of insulin (HCC)    Chronic, ongoing.  Monitor closely due to AM low BS.  Continue collaboration with endocrinology.        Follow up plan: Return in about 6 months (around 12/16/2023) for T2DM, HTN/HLD/HF, CHRONIC PAIN.

## 2023-06-15 NOTE — Assessment & Plan Note (Signed)
Chronic, ongoing.  Continue collaboration with hematology and infusions.  Recent notes reviewed.

## 2023-06-16 LAB — LIPID PANEL W/O CHOL/HDL RATIO
Cholesterol, Total: 168 mg/dL (ref 100–199)
HDL: 76 mg/dL (ref >39)
LDL Chol Calc (NIH): 79 mg/dL (ref 0–99)
Triglycerides: 65 mg/dL (ref 0–149)
VLDL Cholesterol Cal: 13 mg/dL (ref 5–40)

## 2023-06-16 LAB — VITAMIN D 25 HYDROXY (VIT D DEFICIENCY, FRACTURES): Vit D, 25-Hydroxy: 27 ng/mL — ABNORMAL LOW (ref 30.0–100.0)

## 2023-06-16 NOTE — Progress Notes (Signed)
Contacted via MyChart    Good morning Tonya [name], your labs have returned.  Vitamin D remains mildly low, please ensure you are taking Vitamin D3 2000 units daily for bone health.  Cholesterol labs are stable.  Continue all current medications.  Any questions? Keep being awesome!!  Thank you for allowing me to participate in your care.  I appreciate you. Kindest regards, Sahej Hauswirth

## 2023-06-24 ENCOUNTER — Telehealth: Payer: Medicare Other

## 2023-06-24 ENCOUNTER — Inpatient Hospital Stay: Payer: Medicare Other | Admitting: Oncology

## 2023-06-24 ENCOUNTER — Inpatient Hospital Stay: Payer: Medicare Other

## 2023-06-24 ENCOUNTER — Ambulatory Visit (INDEPENDENT_AMBULATORY_CARE_PROVIDER_SITE_OTHER): Payer: Medicare Other

## 2023-06-24 DIAGNOSIS — I5032 Chronic diastolic (congestive) heart failure: Secondary | ICD-10-CM

## 2023-06-24 DIAGNOSIS — E1140 Type 2 diabetes mellitus with diabetic neuropathy, unspecified: Secondary | ICD-10-CM

## 2023-06-24 DIAGNOSIS — I130 Hypertensive heart and chronic kidney disease with heart failure and stage 1 through stage 4 chronic kidney disease, or unspecified chronic kidney disease: Secondary | ICD-10-CM

## 2023-06-24 NOTE — Chronic Care Management (AMB) (Signed)
Chronic Care Management   CCM RN Visit Note  06/24/2023 Name: Tonya [name] MRN: [id number] DOB: February 11, 1946  Subjective: Tonya [name] is a 77 y.o. year old female who is a primary care patient of Cannady, Dorie Rank, NP. The patient was referred to the Chronic Care Management team for assistance with care management needs subsequent to provider initiation of CCM services and plan of care.    Today's Visit:  Engaged with patient by telephone for follow up visit.        Goals Addressed             This Visit's Progress    CCM Expected Outcome:  Monitor, Self-Manage and Reduce Symptoms of Diabetes       Current Barriers:  Knowledge Deficits related to the importance of normalized blood sugars and A1C WNL to prevent possible complications with kidneys and other body systems  Chronic Disease Management support and education needs related to effective management of DM Lab Results  Component Value Date   HGBA1C 8.1 (H) 08/26/2022  Last A1C was 8.5% in April 2024. Will have new one in August 2024 with Endocrinology    Planned Interventions: Provided education to patient about basic DM disease process. The patient saw the endocrinologist in June. Her numbers are better but no new A1C drawn. The patient states she will get a new A1C dawn at her visit in August.   Reviewed medications with patient and discussed importance of medication adherence. States compliance with medications. Denies any medication needs at this time. Did review the pcp recommendations to take Vitamin D3 2000 units daily as her Vitamin D level was a little low.  ;        Reviewed prescribed diet with patient heart healthy/ADA diet. Review of dietary restrictions. Education on monitoring for changes and foods that cause her blood sugars to be out of range. ; Counseled on importance of regular laboratory monitoring as prescribed. Is having her A1C through endocrinologist at this time;        Discussed plans with patient  for ongoing care management follow up and provided patient with direct contact information for care management team;      Provided patient with written educational materials related to hypo and hyperglycemia and importance of correct treatment. Denies any lows. Per her continuous reader her readings have been 78%  in range and her target is 70-140;       Reviewed scheduled/upcoming provider appointments including: 12-18-2023 at 2 pm, saw pcp on 06-15-2023. Knows to call for changes or new needs. ;         Advised patient, providing education and rationale, to check cbg once daily and when you have symptoms of low or high blood sugar and record. The patient states that her blood sugars have been good but could not provide numbers.  The patient had readings of 78% at target. Her target range is 70 to 140.  The patient sees endocrinologist also and the endocrinologist has ordered a new continuous glucose reader as the readings were fasting or at around lunch time. An upgrade will show more readings. Discussed the goal of A1C. Most recent A1C 8.5 on April 2024 at the endocrinologist office. Next A1C will be August of 2024. call provider for findings outside established parameters;       Review of patient status, including review of consultants reports, relevant laboratory and other test results, and medications completed;       Advised patient to discuss  changes in DM, questions, and concerns with provider;      Screening for signs and symptoms of depression related to chronic disease state;        Assessed social determinant of health barriers;        The patient has had a new onset of dizziness recently and has been changing her position slowly. Assessment completed. The patient denies any issues with low blood sugars. The patient states that her sugars have not been low. The patient has not taken  her blood pressure when the dizzy spells occur. Education on changing position slowly and when it is safe to do so  check her blood sugars and blood pressure. Education on orthostatic hypotension. Collaboration with the pcp and advised the patient it was safe to take OTC meclizine for dizziness. Review of safety concerns and to call the office for changes. Also will attach Epleys maneuver information in myChart for the patient to try to see if this will help for her dizziness. The patient has had a fall since last outreach and has not tried Epley's maneuver. Will attach information in myChart again. The patient states that she still has dizziness but not as bad. She is mindful of changing position slowly and she knows to be safe. Denies any falls or safety concerns.  Symptom Management: Take medications as prescribed   Attend all scheduled provider appointments Call provider office for new concerns or questions  call the Suicide and Crisis Lifeline: 988 call the [place] National Suicide Prevention Lifeline: 743 343 1154 or TTY: 770-280-7228 TTY 743-499-0405) to talk to a trained counselor call 1-800-273-TALK (toll free, 24 hour hotline) if experiencing a Mental Health or Behavioral Health Crisis  check blood sugar at prescribed times: once daily and when you have symptoms of low or high blood sugar check feet daily for cuts, sores or redness trim toenails straight across manage portion size wash and dry feet carefully every day wear comfortable, cotton socks wear comfortable, well-fitting shoes  Follow Up Plan: Telephone follow up appointment with care management team member scheduled for: 08-19-2023 at 345 pm       CCM Expected Outcome:  Monitor, Self-Manage and Reduce Symptoms of Heart Failure       Current Barriers:  Knowledge Deficits related to monitoring for fluid shifts and wearing compression hose to help with extra fluid on board in legs and feet Chronic Disease Management support and education needs related to effective management of HF Wt Readings from Last 3 Encounters:  06/15/23 139 lb 9.6 oz  (63.3 kg)  03/04/23 145 lb 6.4 oz (66 kg)  02/25/23 145 lb 3.2 oz (65.9 kg)     Planned Interventions: Basic overview and discussion of pathophysiology of Heart Failure reviewed. The patients weight is staying stable. Denies any issues with fluid overload, swelling or edema in her feet and legs. The patient sees cardiologist on a regular basis. Review of CKD and eGFR of 23. The patient denies any acute fluid shifts. The patient denies any new concerns related to her heart failure.  Provided education on low sodium diet. Patient states she has not had an appetite but it seems to be getting better than what it was. Education and support given Reviewed Heart Failure Action Plan in depth and provided written copy. The patient states she has not had swelling in her feet and legs in a while. She states that she is doing well and denies any acute findings today. She has not been having to wear her compression  stocking and states her legs have been "flat". Education and support given Assessed need for readable accurate scales in home Provided education about placing scale on hard, flat surface Advised patient to weigh each morning after emptying bladder Discussed importance of daily weight and advised patient to weigh and record daily Reviewed role of diuretics in prevention of fluid overload and management of heart failure Discussed the importance of keeping all appointments with provider Provided patient with education about the role of exercise in the management of heart failure Advised patient to discuss changes in HF or heart health with provider Screening for signs and symptoms of depression related to chronic disease state  Assessed social determinant of health barriers EF is 60-65%  Symptom Management: Take medications as prescribed   Attend all scheduled provider appointments Call provider office for new concerns or questions  call the Suicide and Crisis Lifeline: 988 call the [place] National  Suicide Prevention Lifeline: 340 205 1081 or TTY: 305-593-9361 TTY 409-727-1817) to talk to a trained counselor call 1-800-273-TALK (toll free, 24 hour hotline) if experiencing a Mental Health or Behavioral Health Crisis  call office if I gain more than 2 pounds in one day or 5 pounds in one week use salt in moderation watch for swelling in feet, ankles and legs every day weigh myself daily develop a rescue plan follow rescue plan if symptoms flare-up track symptoms and what helps feel better or worse dress right for the weather, hot or cold  Follow Up Plan: Telephone follow up appointment with care management team member scheduled for: 08-19-2023 at 345 pm       CCM Expected Outcome:  Monitor, Self-Manage, and Reduce Symptoms of Hypertension       Current Barriers:  Knowledge Deficits related to checking blood pressures on a regular bases to pick up on trends and changes that could impact overall health and well being Chronic Disease Management support and education needs related to effective management of HTN  BP Readings from Last 3 Encounters:  06/15/23 138/64  06/01/23 125/72  05/27/23 (!) 180/86     Planned Interventions: Evaluation of current treatment plan related to hypertension self management and patient's adherence to plan as established by provider. The patient has more stable blood pressures. Denies any acute findings today. States that she still having some dizziness but not bad. Is being safe. Denies any new falls.  Provided education to patient re: stroke prevention, s/s of heart attack and stroke; Reviewed prescribed diet heart healthy/ADA diet. The patient is eating well. Review of dietary restrictions. Reviewed medications with patient and discussed importance of compliance. The patient states compliance with medications. ;  Discussed plans with patient for ongoing care management follow up and provided patient with direct contact information for care management  team; Advised patient, providing education and rationale, to monitor blood pressure daily and record, calling PCP for findings outside established parameters;  Reviewed scheduled/upcoming provider appointments including: 12-18-2023 at 2 pm Advised patient to discuss changes in HTN or heart health with provider; Provided education on prescribed diet heart healthy/ADA diet ;  Discussed complications of poorly controlled blood pressure such as heart disease, stroke, circulatory complications, vision complications, kidney impairment, sexual dysfunction;  Collaboration with the pcp about the new onset of dizziness the patient is having. The patient states that she has noticed this recently when she gets up from sitting or changes position. Review of Epley's maneuver and OTC medications as recommended by the pcp. Screening for signs and symptoms of depression related  to chronic disease state;  Assessed social determinant of health barriers;   Symptom Management: Take medications as prescribed   Attend all scheduled provider appointments Call provider office for new concerns or questions  call the Suicide and Crisis Lifeline: 988 call the [place] National Suicide Prevention Lifeline: 530-822-7610 or TTY: 226-763-6480 TTY 838-457-3079) to talk to a trained counselor call 1-800-273-TALK (toll free, 24 hour hotline) if experiencing a Mental Health or Behavioral Health Crisis  check blood pressure weekly learn about high blood pressure keep a blood pressure log take blood pressure log to all doctor appointments call doctor for signs and symptoms of high blood pressure develop an action plan for high blood pressure keep all doctor appointments take medications for blood pressure exactly as prescribed report new symptoms to your doctor  Follow Up Plan: Telephone follow up appointment with care management team member scheduled for: 08-19-2023 at 345 pm          Plan:Telephone follow up appointment  with care management team member scheduled for:  08-19-2023 at 345 pm  Alto Denver RN, MSN, CCM RN Care Manager  Chronic Care Management Direct Number: [phone]

## 2023-06-24 NOTE — Assessment & Plan Note (Deleted)
History of iron deficiency anemia,  anemia secondary to chronic kidney disease. Labs reviewed and discussed patient. Retacrit 10,000 units x 1 today. Recommend Retacrit every 4 weeks if hemoglobin is less than 10. 

## 2023-06-24 NOTE — Patient Instructions (Signed)
Please call the care guide team at [phone] if you need to cancel or reschedule your appointment.   If you are experiencing a Mental Health or Behavioral Health Crisis or need someone to talk to, please call the Suicide and Crisis Lifeline: 988 call the [place] National Suicide Prevention Lifeline: (919)169-3256 or TTY: (530) 202-6121 TTY 610-799-7974) to talk to a trained counselor call 1-800-273-TALK (toll free, 24 hour hotline)   Following is a copy of the CCM Program Consent:  CCM service includes personalized support from designated clinical staff supervised by the physician, including individualized plan of care and coordination with other care providers 24/7 contact phone numbers for assistance for urgent and routine care needs. Service will only be billed when office clinical staff spend 20 minutes or more in a month to coordinate care. Only one practitioner may furnish and bill the service in a calendar month. The patient may stop CCM services at amy time (effective at the end of the month) by phone call to the office staff. The patient will be responsible for cost sharing (co-pay) or up to 20% of the service fee (after annual deductible is met)  Following is a copy of your full provider care plan:   Goals Addressed             This Visit's Progress    CCM Expected Outcome:  Monitor, Self-Manage and Reduce Symptoms of Diabetes       Current Barriers:  Knowledge Deficits related to the importance of normalized blood sugars and A1C WNL to prevent possible complications with kidneys and other body systems  Chronic Disease Management support and education needs related to effective management of DM Lab Results  Component Value Date   HGBA1C 8.1 (H) 08/26/2022  Last A1C was 8.5% in April 2024. Will have new one in August 2024 with Endocrinology    Planned Interventions: Provided education to patient about basic DM disease process. The patient saw the endocrinologist in June. Her  numbers are better but no new A1C drawn. The patient states she will get a new A1C dawn at her visit in August.   Reviewed medications with patient and discussed importance of medication adherence. States compliance with medications. Denies any medication needs at this time. Did review the pcp recommendations to take Vitamin D3 2000 units daily as her Vitamin D level was a little low.  ;        Reviewed prescribed diet with patient heart healthy/ADA diet. Review of dietary restrictions. Education on monitoring for changes and foods that cause her blood sugars to be out of range. ; Counseled on importance of regular laboratory monitoring as prescribed. Is having her A1C through endocrinologist at this time;        Discussed plans with patient for ongoing care management follow up and provided patient with direct contact information for care management team;      Provided patient with written educational materials related to hypo and hyperglycemia and importance of correct treatment. Denies any lows. Per her continuous reader her readings have been 78%  in range and her target is 70-140;       Reviewed scheduled/upcoming provider appointments including: 12-18-2023 at 2 pm, saw pcp on 06-15-2023. Knows to call for changes or new needs. ;         Advised patient, providing education and rationale, to check cbg once daily and when you have symptoms of low or high blood sugar and record. The patient states that her blood sugars have been  good but could not provide numbers.  The patient had readings of 78% at target. Her target range is 70 to 140.  The patient sees endocrinologist also and the endocrinologist has ordered a new continuous glucose reader as the readings were fasting or at around lunch time. An upgrade will show more readings. Discussed the goal of A1C. Most recent A1C 8.5 on April 2024 at the endocrinologist office. Next A1C will be August of 2024. call provider for findings outside established  parameters;       Review of patient status, including review of consultants reports, relevant laboratory and other test results, and medications completed;       Advised patient to discuss changes in DM, questions, and concerns with provider;      Screening for signs and symptoms of depression related to chronic disease state;        Assessed social determinant of health barriers;        The patient has had a new onset of dizziness recently and has been changing her position slowly. Assessment completed. The patient denies any issues with low blood sugars. The patient states that her sugars have not been low. The patient has not taken  her blood pressure when the dizzy spells occur. Education on changing position slowly and when it is safe to do so check her blood sugars and blood pressure. Education on orthostatic hypotension. Collaboration with the pcp and advised the patient it was safe to take OTC meclizine for dizziness. Review of safety concerns and to call the office for changes. Also will attach Epleys maneuver information in myChart for the patient to try to see if this will help for her dizziness. The patient has had a fall since last outreach and has not tried Epley's maneuver. Will attach information in myChart again. The patient states that she still has dizziness but not as bad. She is mindful of changing position slowly and she knows to be safe. Denies any falls or safety concerns.  Symptom Management: Take medications as prescribed   Attend all scheduled provider appointments Call provider office for new concerns or questions  call the Suicide and Crisis Lifeline: 988 call the [place] National Suicide Prevention Lifeline: 910-021-7960 or TTY: (910) 502-1858 TTY (865)735-7848) to talk to a trained counselor call 1-800-273-TALK (toll free, 24 hour hotline) if experiencing a Mental Health or Behavioral Health Crisis  check blood sugar at prescribed times: once daily and when you have symptoms  of low or high blood sugar check feet daily for cuts, sores or redness trim toenails straight across manage portion size wash and dry feet carefully every day wear comfortable, cotton socks wear comfortable, well-fitting shoes  Follow Up Plan: Telephone follow up appointment with care management team member scheduled for: 08-19-2023 at 345 pm       CCM Expected Outcome:  Monitor, Self-Manage and Reduce Symptoms of Heart Failure       Current Barriers:  Knowledge Deficits related to monitoring for fluid shifts and wearing compression hose to help with extra fluid on board in legs and feet Chronic Disease Management support and education needs related to effective management of HF Wt Readings from Last 3 Encounters:  06/15/23 139 lb 9.6 oz (63.3 kg)  03/04/23 145 lb 6.4 oz (66 kg)  02/25/23 145 lb 3.2 oz (65.9 kg)     Planned Interventions: Basic overview and discussion of pathophysiology of Heart Failure reviewed. The patients weight is staying stable. Denies any issues with fluid overload, swelling or  edema in her feet and legs. The patient sees cardiologist on a regular basis. Review of CKD and eGFR of 23. The patient denies any acute fluid shifts. The patient denies any new concerns related to her heart failure.  Provided education on low sodium diet. Patient states she has not had an appetite but it seems to be getting better than what it was. Education and support given Reviewed Heart Failure Action Plan in depth and provided written copy. The patient states she has not had swelling in her feet and legs in a while. She states that she is doing well and denies any acute findings today. She has not been having to wear her compression stocking and states her legs have been "flat". Education and support given Assessed need for readable accurate scales in home Provided education about placing scale on hard, flat surface Advised patient to weigh each morning after emptying bladder Discussed  importance of daily weight and advised patient to weigh and record daily Reviewed role of diuretics in prevention of fluid overload and management of heart failure Discussed the importance of keeping all appointments with provider Provided patient with education about the role of exercise in the management of heart failure Advised patient to discuss changes in HF or heart health with provider Screening for signs and symptoms of depression related to chronic disease state  Assessed social determinant of health barriers EF is 60-65%  Symptom Management: Take medications as prescribed   Attend all scheduled provider appointments Call provider office for new concerns or questions  call the Suicide and Crisis Lifeline: 988 call the [place] National Suicide Prevention Lifeline: (346)110-8526 or TTY: 770 160 6491 TTY 639-626-9871) to talk to a trained counselor call 1-800-273-TALK (toll free, 24 hour hotline) if experiencing a Mental Health or Behavioral Health Crisis  call office if I gain more than 2 pounds in one day or 5 pounds in one week use salt in moderation watch for swelling in feet, ankles and legs every day weigh myself daily develop a rescue plan follow rescue plan if symptoms flare-up track symptoms and what helps feel better or worse dress right for the weather, hot or cold  Follow Up Plan: Telephone follow up appointment with care management team member scheduled for: 08-19-2023 at 345 pm       CCM Expected Outcome:  Monitor, Self-Manage, and Reduce Symptoms of Hypertension       Current Barriers:  Knowledge Deficits related to checking blood pressures on a regular bases to pick up on trends and changes that could impact overall health and well being Chronic Disease Management support and education needs related to effective management of HTN  BP Readings from Last 3 Encounters:  06/15/23 138/64  06/01/23 125/72  05/27/23 (!) 180/86     Planned Interventions: Evaluation  of current treatment plan related to hypertension self management and patient's adherence to plan as established by provider. The patient has more stable blood pressures. Denies any acute findings today. States that she still having some dizziness but not bad. Is being safe. Denies any new falls.  Provided education to patient re: stroke prevention, s/s of heart attack and stroke; Reviewed prescribed diet heart healthy/ADA diet. The patient is eating well. Review of dietary restrictions. Reviewed medications with patient and discussed importance of compliance. The patient states compliance with medications. ;  Discussed plans with patient for ongoing care management follow up and provided patient with direct contact information for care management team; Advised patient, providing education and rationale, to  monitor blood pressure daily and record, calling PCP for findings outside established parameters;  Reviewed scheduled/upcoming provider appointments including: 12-18-2023 at 2 pm Advised patient to discuss changes in HTN or heart health with provider; Provided education on prescribed diet heart healthy/ADA diet ;  Discussed complications of poorly controlled blood pressure such as heart disease, stroke, circulatory complications, vision complications, kidney impairment, sexual dysfunction;  Collaboration with the pcp about the new onset of dizziness the patient is having. The patient states that she has noticed this recently when she gets up from sitting or changes position. Review of Epley's maneuver and OTC medications as recommended by the pcp. Screening for signs and symptoms of depression related to chronic disease state;  Assessed social determinant of health barriers;   Symptom Management: Take medications as prescribed   Attend all scheduled provider appointments Call provider office for new concerns or questions  call the Suicide and Crisis Lifeline: 988 call the [place] National Suicide  Prevention Lifeline: (581)822-8733 or TTY: 249 596 9718 TTY 770-675-3110) to talk to a trained counselor call 1-800-273-TALK (toll free, 24 hour hotline) if experiencing a Mental Health or Behavioral Health Crisis  check blood pressure weekly learn about high blood pressure keep a blood pressure log take blood pressure log to all doctor appointments call doctor for signs and symptoms of high blood pressure develop an action plan for high blood pressure keep all doctor appointments take medications for blood pressure exactly as prescribed report new symptoms to your doctor  Follow Up Plan: Telephone follow up appointment with care management team member scheduled for: 08-19-2023 at 345 pm          Patient verbalizes understanding of instructions and care plan provided today and agrees to view in MyChart. Active MyChart status and patient understanding of how to access instructions and care plan via MyChart confirmed with patient.  Telephone follow up appointment with care management team member scheduled for: 08-19-2023 at 345 pm

## 2023-07-01 ENCOUNTER — Ambulatory Visit: Payer: Self-pay | Admitting: *Deleted

## 2023-07-01 NOTE — Telephone Encounter (Signed)
Summary: right arm pain and neck pain   right arm pain and neck pains 2days..  (its about a 3 right now)  but it comes and goes         Reason for Disposition  [1] MODERATE neck pain (e.g., interferes with normal activities) AND [2] present > 3 days  [1] MODERATE pain (e.g., interferes with normal activities) AND [2] present > 3 days  Answer Assessment - Initial Assessment Questions 1. ONSET: "When did the pain begin?"      Started Sunday 2. LOCATION: "Where does it hurt?"      R side 3. PATTERN "Does the pain come and go, or has it been constant since it started?"      Uncomfortable to lay on or put weight on it 4. SEVERITY: "How bad is the pain?"  (Scale 1-10; or mild, moderate, severe)   - NO PAIN (0): no pain or only slight stiffness    - MILD (1-3): doesn't interfere with normal activities    - MODERATE (4-7): interferes with normal activities or awakens from sleep    - SEVERE (8-10):  excruciating pain, unable to do any normal activities      Mild- but when she moves the arm the bone in her arm hurts 5. RADIATION: "Does the pain go anywhere else, shoot into your arms?"     Hand was sore- but may have started from there 6. CORD SYMPTOMS: "Any weakness or numbness of the arms or legs?"     Weakness as far as lifting 7. CAUSE: "What do you think is causing the neck pain?"     unsure 8. NECK OVERUSE: "Any recent activities that involved turning or twisting the neck?"     no 9. OTHER SYMPTOMS: "Do you have any other symptoms?" (e.g., headache, fever, chest pain, difficulty breathing, neck swelling)     no  Answer Assessment - Initial Assessment Questions 1. ONSET: "When did the pain start?"     Soreness in the arm- upper arm 2. LOCATION: "Where is the pain located?"     R arm pain 3. PAIN: "How bad is the pain?" (Scale 1-10; or mild, moderate, severe)   - MILD (1-3): Doesn't interfere with normal activities.   - MODERATE (4-7): Interferes with normal activities (e.g., work  or school) or awakens from sleep.   - SEVERE (8-10): Excruciating pain, unable to do any normal activities, unable to hold a cup of water.     Hurts during night- arm hurt when tries to stretch it out 4. WORK OR EXERCISE: "Has there been any recent work or exercise that involved this part of the body?"     no 5. CAUSE: "What do you think is causing the arm pain?"     unsure 6. OTHER SYMPTOMS: "Do you have any other symptoms?" (e.g., neck pain, swelling, rash, fever, numbness, weakness)     Most pain is with trying to stretch it out. Unable to get unconfortable  Protocols used: Neck Pain or Stiffness-A-AH, Arm Pain-A-AH

## 2023-07-01 NOTE — Telephone Encounter (Signed)
  Chief Complaint: R neck, shoulder, upper arm pain- hurt to lay on it or try to stretch it out. Symptoms: mild pain- causes patient not to sleep well- she states her neck, shoulder and upper arm hurts when she lays down or tries to stretch it out.  Frequency: started Sunday Pertinent Negatives: Patient denies , headache, fever, chest pain, difficulty breathing, neck swelling Disposition: [] ED /[] Urgent Care (no appt availability in office) / [x] Appointment(In office/virtual)/ []  [facility]/ [] Home Care/ [] Refused Recommended Disposition /[] [facility]/ []  Follow-up with PCP Additional Notes: Patient is concerned about her trouble sleeping at night with this discomfort- she does have Ortho provider that she is current with- she will also reach out to him to see if they can see her sooner.

## 2023-07-02 DIAGNOSIS — M25511 Pain in right shoulder: Secondary | ICD-10-CM | POA: Diagnosis not present

## 2023-07-02 DIAGNOSIS — M79601 Pain in right arm: Secondary | ICD-10-CM | POA: Diagnosis not present

## 2023-07-06 ENCOUNTER — Ambulatory Visit (INDEPENDENT_AMBULATORY_CARE_PROVIDER_SITE_OTHER): Payer: Medicare Other | Admitting: Family Medicine

## 2023-07-06 ENCOUNTER — Encounter: Payer: Self-pay | Admitting: Family Medicine

## 2023-07-06 VITALS — BP 175/88 | HR 73 | Temp 97.8°F | Wt 139.2 lb

## 2023-07-06 DIAGNOSIS — M25511 Pain in right shoulder: Secondary | ICD-10-CM | POA: Diagnosis not present

## 2023-07-06 MED ORDER — DICLOFENAC SODIUM 1 % EX GEL: 4.0000 g | Freq: Four times a day (QID) | CUTANEOUS | 1 refills | Status: DC

## 2023-07-06 NOTE — Patient Instructions (Addendum)
Continue using Tylenol 1000 mg every 6 hours, do not take more than 4000 mg in one day.  Try Voltaren gel over muscle area of pain  Try lidocaine pain patches and heat to the area  Follow up with orthopedic for further management and treatment.

## 2023-07-06 NOTE — Progress Notes (Signed)
BP (!) 175/88   Pulse 73   Temp 97.8 F (36.6 C) (Oral)   Wt 139 lb 3.2 oz (63.1 kg)   LMP  (LMP Unknown)   SpO2 98%   BMI 21.80 kg/m    Subjective:    Patient ID: Tonya [name], female    DOB: 03-Feb-1946, 77 y.o.   MRN: [id number]  HPI: Tonya [name] is a 77 y.o. female  Chief Complaint  Patient presents with   Arm Pain    Pt states the pain started Saturday , went to emergency room got x-rays done still in constant pain.   BP recheck is 175/88. She has not taken her BP medications this morning. She is taking Carvedilol 3.125mg  and Olmesartan 40mg  daily.  Has history of lumbar radiculopathy and post herpetic neuralgia. She is following pain management for back pain and chronic pain syndrome, saw last December 2023 and suggested peripheral nerve stimulation based on patients decision, she does not have appointment set up for this.  ARM PAIN She is taking gabapentin 100 mg at bedtime. [facility], imaging showed no fractures, suggested pain was due to deterioration and arthritis. Duration: 9 days Location:  right upper arm and shoulder Mechanism of injury: unknown Onset: sudden Severity: 8/10  Quality:  sore and tender Frequency: constant Radiation: yes down right arm Aggravating factors:  laying on her arm , trouble sleeping at night and getting comfortable Alleviating factors:  Tylenol helps a little but pain comes back; she is taking 1000 mg BID tablets  Aleve helps more, but not recommended to take this d/t CKD   Status: stable Treatments attempted:  Tylenol and aleve  Relief with NSAIDs?:  moderate Swelling: no Redness: no  Warmth: no Trauma: no Chest pain: no  Shortness of breath: no  Fever: no Decreased sensation: yes Paresthesias: no Weakness: no  Relevant past medical, surgical, family and social history reviewed and updated as indicated. Interim medical history since our last visit reviewed. Allergies and medications  reviewed and updated.  Review of Systems  Constitutional:  Negative for fever.  Respiratory: Negative.    Cardiovascular: Negative.   Musculoskeletal:  Positive for arthralgias and back pain. Negative for gait problem, joint swelling, myalgias, neck pain and neck stiffness.  Skin:  Negative for color change.  Neurological:  Negative for numbness.    Per HPI unless specifically indicated above     Objective:    BP (!) 175/88   Pulse 73   Temp 97.8 F (36.6 C) (Oral)   Wt 139 lb 3.2 oz (63.1 kg)   LMP  (LMP Unknown)   SpO2 98%   BMI 21.80 kg/m   Wt Readings from Last 3 Encounters:  07/06/23 139 lb 3.2 oz (63.1 kg)  06/15/23 139 lb 9.6 oz (63.3 kg)  03/04/23 145 lb 6.4 oz (66 kg)    Physical Exam Vitals and nursing note reviewed.  Constitutional:      General: She is awake. She is not in acute distress.    Appearance: Normal appearance. She is well-developed and well-groomed. She is not ill-appearing.  HENT:     Head: Normocephalic and atraumatic.     Right Ear: Hearing and external ear normal. No drainage.     Left Ear: Hearing and external ear normal. No drainage.     Nose: Nose normal.  Eyes:     General: Lids are normal.        Right eye: No  discharge.        Left eye: No discharge.     Conjunctiva/sclera: Conjunctivae normal.  Cardiovascular:     Rate and Rhythm: Normal rate and regular rhythm.     Pulses:          Radial pulses are 2+ on the right side and 2+ on the left side.       Posterior tibial pulses are 2+ on the right side and 2+ on the left side.     Heart sounds: Normal heart sounds, S1 normal and S2 normal. No murmur heard.    No gallop.  Pulmonary:     Effort: Pulmonary effort is normal. No accessory muscle usage or respiratory distress.     Breath sounds: Normal breath sounds.  Musculoskeletal:     Right shoulder: Tenderness present. No swelling. Decreased range of motion. Normal strength.     Left shoulder: No swelling or tenderness. Normal  range of motion. Normal strength.     Right upper arm: Tenderness present. No swelling.     Left upper arm: No swelling or tenderness.     Cervical back: Full passive range of motion without pain and normal range of motion.     Right lower leg: No edema.     Left lower leg: No edema.  Skin:    General: Skin is warm and dry.     Capillary Refill: Capillary refill takes less than 2 seconds.  Neurological:     Mental Status: She is alert and oriented to person, place, and time.  Psychiatric:        Attention and Perception: Attention normal.        Mood and Affect: Mood normal.        Speech: Speech normal.        Behavior: Behavior normal. Behavior is cooperative.        Thought Content: Thought content normal.     Results for orders placed or performed in visit on 06/15/23  Lipid Panel w/o Chol/HDL Ratio  Result Value Ref Range   Cholesterol, Total 168 100 - 199 mg/dL   Triglycerides 65 0 - 149 mg/dL   HDL 76 >13 mg/dL   VLDL Cholesterol Cal 13 5 - 40 mg/dL   LDL Chol Calc (NIH) 79 0 - 99 mg/dL  VITAMIN D 25 Hydroxy (Vit-D Deficiency, Fractures)  Result Value Ref Range   Vit D, 25-Hydroxy 27.0 (L) 30.0 - 100.0 ng/mL      Assessment & Plan:   Problem List Items Addressed This Visit     Acute pain of right shoulder - Primary    Acute, ongoing. Referral made to ortho. Recommend Tylenol 1000mg  TID, heat to area, voltaren gel, and Lidocaine patches for relief. Educated to avoid NSAID use since CKD.       Relevant Medications   diclofenac Sodium (VOLTAREN) 1 % GEL   Other Relevant Orders   Ambulatory referral to Orthopedic Surgery     Follow up plan: Return if symptoms worsen or fail to improve.

## 2023-07-06 NOTE — Assessment & Plan Note (Signed)
Acute, ongoing. Referral made to ortho. Recommend Tylenol 1000mg  TID, heat to area, voltaren gel, and Lidocaine patches for relief. Educated to avoid NSAID use since CKD.

## 2023-07-07 ENCOUNTER — Inpatient Hospital Stay: Payer: Medicare Other

## 2023-07-07 ENCOUNTER — Telehealth: Payer: Self-pay

## 2023-07-07 ENCOUNTER — Encounter: Payer: Self-pay | Admitting: Oncology

## 2023-07-07 ENCOUNTER — Inpatient Hospital Stay: Payer: Medicare Other | Attending: Oncology

## 2023-07-07 ENCOUNTER — Inpatient Hospital Stay: Payer: Medicare Other | Admitting: Oncology

## 2023-07-07 VITALS — BP 102/70 | HR 79 | Temp 97.5°F | Resp 18 | Wt 138.5 lb

## 2023-07-07 DIAGNOSIS — N184 Chronic kidney disease, stage 4 (severe): Secondary | ICD-10-CM | POA: Diagnosis not present

## 2023-07-07 DIAGNOSIS — D631 Anemia in chronic kidney disease: Secondary | ICD-10-CM | POA: Insufficient documentation

## 2023-07-07 LAB — CBC WITH DIFFERENTIAL (CANCER CENTER ONLY)
Abs Immature Granulocytes: 0.07 10*3/uL (ref 0.00–0.07)
Basophils Absolute: 0 10*3/uL (ref 0.0–0.1)
Basophils Relative: 0 %
Eosinophils Absolute: 0.1 10*3/uL (ref 0.0–0.5)
Eosinophils Relative: 2 %
HCT: 31.1 % — ABNORMAL LOW (ref 36.0–46.0)
Hemoglobin: 9.3 g/dL — ABNORMAL LOW (ref 12.0–15.0)
Immature Granulocytes: 1 %
Lymphocytes Relative: 16 %
Lymphs Abs: 1.3 10*3/uL (ref 0.7–4.0)
MCH: 26.8 pg (ref 26.0–34.0)
MCHC: 29.9 g/dL — ABNORMAL LOW (ref 30.0–36.0)
MCV: 89.6 fL (ref 80.0–100.0)
Monocytes Absolute: 0.5 10*3/uL (ref 0.1–1.0)
Monocytes Relative: 6 %
Neutro Abs: 5.9 10*3/uL (ref 1.7–7.7)
Neutrophils Relative %: 75 %
Platelet Count: 267 10*3/uL (ref 150–400)
RBC: 3.47 MIL/uL — ABNORMAL LOW (ref 3.87–5.11)
RDW: 14.9 % (ref 11.5–15.5)
WBC Count: 7.9 10*3/uL (ref 4.0–10.5)
nRBC: 0 % (ref 0.0–0.2)

## 2023-07-07 LAB — IRON AND TIBC
Iron: 45 ug/dL (ref 28–170)
Saturation Ratios: 16 % (ref 10.4–31.8)
TIBC: 288 ug/dL (ref 250–450)
UIBC: 243 ug/dL

## 2023-07-07 LAB — RETIC PANEL
Immature Retic Fract: 9.9 % (ref 2.3–15.9)
RBC.: 3.46 MIL/uL — ABNORMAL LOW (ref 3.87–5.11)
Retic Count, Absolute: 44.3 10*3/uL (ref 19.0–186.0)
Retic Ct Pct: 1.3 % (ref 0.4–3.1)
Reticulocyte Hemoglobin: 30 pg (ref >27.9)

## 2023-07-07 LAB — FERRITIN: Ferritin: 97 ng/mL (ref 11–307)

## 2023-07-07 MED ORDER — EPOETIN ALFA-EPBX 10000 UNIT/ML IJ SOLN
10000.0000 [IU] | Freq: Once | INTRAMUSCULAR | Status: AC
  Administered 2023-07-07: 10000 [IU] via SUBCUTANEOUS
  Filled 2023-07-07: qty 1

## 2023-07-07 NOTE — Progress Notes (Signed)
Hematology/Oncology Progress note [phone] [phone]     CHIEF COMPLAINTS/REASON FOR VISIT:  Follow-up for anemia due to chronic kidney disease   ASSESSMENT & PLAN:   Anemia in stage 4 chronic kidney disease (HCC) History of iron deficiency anemia,  anemia secondary to chronic kidney disease. Labs reviewed and discussed patient. Lab Results  Component Value Date   HGB 9.3 (L) 07/07/2023   TIBC 288 07/07/2023   IRONPCTSAT 16 07/07/2023   FERRITIN 97 07/07/2023     Retacrit 10,000 units x 1 today. Ferritin is <200, recommmend Venofer weekly x 2.  Recommend Retacrit every 5 weeks if hemoglobin is less than 10.   Orders Placed This Encounter  Procedures   Hemoglobin and Hematocrit, Blood    Standing Status:   Future    Standing Expiration Date:   07/06/2024   Hemoglobin and Hematocrit, Blood    Standing Status:   Future    Standing Expiration Date:   07/06/2024   Ferritin    Standing Status:   Future    Standing Expiration Date:   07/06/2024   Iron and TIBC    Standing Status:   Future    Standing Expiration Date:   07/06/2024   Retic Panel    Standing Status:   Future    Standing Expiration Date:   07/06/2024   CBC with Differential (Cancer Center Only)    Standing Status:   Future    Standing Expiration Date:   07/06/2024   Follow-up  H&H labs in 5w,10w +/- retacrit 15 weeks, lab MD +/- retacrit    All questions were answered. The patient knows to call the clinic with any problems, questions or concerns.  Rickard Patience, MD, PhD [facility] Hematology Oncology 07/07/2023     HISTORY OF PRESENTING ILLNESS:  Tonya [name] is a  77 y.o.  female with PMH listed below who was referred to me for evaluation of anemia Reviewed patient's recent labs that was done at [facility] office. Labs revealed anemia with hemoglobin of 8.9, MCV 82, normal platelet and wbc  Reviewed patient's previous labs ordered by primary care physician's office, anemia is  chronic onset , duration is since 2017 No aggravating or improving factors.  Associated signs and symptoms: Patient reports fatigue.  SOB with exertion.  Denies weight loss, easy bruising, hematochezia, hemoptysis, hematuria. Context: History of GI bleeding: denies               History of Chronic kidney disease: CKD, follows up with Dr.Kolluru.                History of autoimmune disease: denies               History of hemolytic anemia. denies               Last colonoscopy: underwent colonoscopy on 06/06/2019.  Due to poor preparation, patient was scheduled to repeat colonoscopy due to suboptimal bowel preparation.  Patient told me that she is waiting for insurance approval for repeat procedure.  Previously tolerated IV Venofer treatments very well.  acute drop of hemoglobin to 5.9, status post PRBC transfusion at [facility].  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  She was discharged with PPI for 6 weeks. Hemoglobin at discharge on 12/10/2019 was 8.6.  INTERVAL HISTORY Tonya [name] is a 77 y.o. female who has above history reviewed by me  today presents to reestablish care for chronic anemia. She reports feeling well today.  No new complaints.  Review of Systems  Constitutional:  Positive for fatigue. Negative for appetite change, chills and fever.  HENT:   Negative for hearing loss and voice change.   Eyes:  Negative for eye problems.  Respiratory:  Negative for chest tightness and cough.   Cardiovascular:  Negative for chest pain.  Gastrointestinal:  Negative for abdominal distention, abdominal pain and blood in stool.  Endocrine: Negative for hot flashes.  Genitourinary:  Negative for difficulty urinating and frequency.   Musculoskeletal:  Negative for arthralgias.  Skin:  Negative for itching and rash.  Neurological:  Negative for extremity weakness.   Hematological:  Negative for adenopathy.  Psychiatric/Behavioral:  Negative for confusion.     MEDICAL HISTORY:  Past Medical History:  Diagnosis Date   (HFpEF) heart failure with preserved ejection fraction (HCC)    a.) TTE 05/29/2017: EF 55-60%, mild LAE, G1DD; b.) TTE 09/07/2017: EF >55%, mild LAE, Ao sclerosis without stenosis, degen MV disease, G2DD; c.) TTE 12/02/2018: EF 55-60%, mild LVH, Mild BAE, triv TR, G1DD; d.) TTE 11/22/2019: EF 60-65%, mod LVH, mild TR, G1DD   Anemia in stage 4 chronic kidney disease (HCC)    Arthritis    Cardiomyopathy (HCC)    CKD (chronic kidney disease) stage 4, GFR 15-29 ml/min (HCC)    CVA (cerebral vascular accident) (HCC) 08/2017   a.) no residual deficits   Diabetic neuropathy (HCC)    Diverticulitis    GERD (gastroesophageal reflux disease)    History of hiatal hernia    Hyperlipidemia    Hypertension    Long term current use of antithrombotics/antiplatelets    a.) DAPT therapy (ASA + clopidogrel)   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)    Paraesophageal hernia    PHN (postherpetic neuralgia)    Sarcoidosis    Shingles    Type 2 diabetes mellitus treated with insulin (HCC)    Wears dentures    partial upper    SURGICAL HISTORY: Past Surgical History:  Procedure Laterality Date   CATARACT EXTRACTION W/PHACO Left 02/05/2021   Procedure: CATARACT EXTRACTION PHACO AND INTRAOCULAR LENS PLACEMENT (IOC) LEFT DIABETIC 5.47 00:51.4;  Surgeon: Galen Manila, MD;  Location: [facility];  Service: Ophthalmology;  Laterality: Left;  Diabetic - insulin and oral meds   CATARACT EXTRACTION W/PHACO Right 03/19/2021   Procedure: CATARACT EXTRACTION PHACO AND INTRAOCULAR LENS PLACEMENT (IOC) RIGHT DIABETIC 6.09 00:39.6;  Surgeon: Galen Manila, MD;  Location: [facility];  Service: Ophthalmology;  Laterality: Right;   COLONOSCOPY  12/15/2006   COLONOSCOPY WITH PROPOFOL N/A 06/06/2019   Procedure:  COLONOSCOPY WITH PROPOFOL;  Surgeon: Wyline Mood, MD;  Location: [facility];  Service: Gastroenterology;  Laterality: N/A;   COLONOSCOPY WITH PROPOFOL N/A 01/16/2020   Procedure: COLONOSCOPY WITH BIOPSY;  Surgeon: Wyline Mood, MD;  Location: [facility];  Service: Endoscopy;  Laterality: N/A;  Diabetic - insulin and oral meds   ESOPHAGOGASTRODUODENOSCOPY (EGD) WITH PROPOFOL N/A 06/23/2022   Procedure: ESOPHAGOGASTRODUODENOSCOPY (EGD) WITH PROPOFOL;  Surgeon: Wyline Mood, MD;  [facility];  Service: Gastroenterology;  Laterality: N/A;   EYE SURGERY     INSERTION OF MESH  08/26/2022   Procedure: INSERTION OF MESH;  Surgeon: Leafy Ro, MD;  Location: [facility];  Service: General;;   POLYPECTOMY N/A 01/16/2020   Procedure: POLYPECTOMY;  Surgeon: Wyline Mood, MD;  Location: [facility];  Service: Endoscopy;  Laterality: N/A;   TUBAL LIGATION     XI ROBOTIC ASSISTED PARAESOPHAGEAL HERNIA REPAIR N/A 08/26/2022   Procedure: XI ROBOTIC ASSISTED PARAESOPHAGEAL HERNIA REPAIR, RNFA to assist;  Surgeon: Leafy Ro, MD;  Location: [facility];  Service: General;  Laterality: N/A;    SOCIAL HISTORY: Social History   Socioeconomic History   Marital status: Married    Spouse name: Not on file   Number of children: Not on file   Years of education: Not on file   Highest education level: GED or equivalent  Occupational History   Occupation: retired  Tobacco Use   Smoking status: Never    Passive exposure: Never   Smokeless tobacco: Never  Vaping Use   Vaping status: Never Used  Substance and Sexual Activity   Alcohol use: No    Alcohol/week: 0.0 standard drinks of alcohol   Drug use: No   Sexual activity: Yes  Other Topics Concern   Not on file  Social History Narrative   Not on file   Social Determinants of Health   Financial Resource Strain: High Risk (04/13/2023)   Overall Financial Resource Strain (CARDIA)    Difficulty of Paying Living Expenses: Hard   Food Insecurity: No Food Insecurity (02/17/2023)   Hunger Vital Sign    Worried About Running Out of Food in the Last Year: Never true    Ran Out of Food in the Last Year: Never true  Transportation Needs: No Transportation Needs (04/13/2023)   PRAPARE - Administrator, Civil Service (Medical): No    Lack of Transportation (Non-Medical): No  Physical Activity: Inactive (02/17/2023)   Exercise Vital Sign    Days of Exercise per Week: 0 days    Minutes of Exercise per Session: 0 min  Stress: No Stress Concern Present (02/17/2023)   [employer] of Occupational Health - Occupational Stress Questionnaire    Feeling of Stress : Not at all  Social Connections: Moderately Isolated (02/17/2023)   Social Connection and Isolation Panel [NHANES]    Frequency of Communication with Friends and Family: More than three times a week    Frequency of Social Gatherings with Friends and Family: Three times a week    Attends Religious Services: Never    Active Member of Clubs or Organizations: No    Attends Banker Meetings: Never    Marital Status: Married  Catering manager Violence: Not At Risk (02/17/2023)   Humiliation, Afraid, Rape, and Kick questionnaire    Fear of Current or Ex-Partner: No    Emotionally Abused: No    Physically Abused: No    Sexually Abused: No    FAMILY HISTORY: Family History  Problem Relation Age of Onset   Diabetes Mother    Stroke Mother    Hyperlipidemia Father    Hypertension Father    Diabetes Sister    Diabetes Brother    Gout Son    Diabetes Brother    Kidney disease Son    Lymphoma Sister    Heart attack Sister    Breast cancer Neg Hx     ALLERGIES:  is allergic to atorvastatin, gabapentin, pravastatin, pregabalin, and trulicity [dulaglutide].  MEDICATIONS:  Current Outpatient Medications  Medication Sig Dispense Refill   acetaminophen (TYLENOL) 650 MG CR tablet Take 650 mg by mouth every 8 (eight) hours as needed for pain.      aspirin 81 MG tablet Take 81 mg by mouth daily.     carvedilol (COREG) 3.125 MG  tablet Take 1 tablet (3.125 mg total) by mouth 2 (two) times daily with a meal. PLEASE CALL OFFICE TO SCHEDULE APPOINTMENT PRIOR TO NEXT REFILL 180 tablet 0   diclofenac Sodium (VOLTAREN) 1 % GEL Apply 4 g topically 4 (four) times daily. 4 g 1   docusate sodium (COLACE) 100 MG capsule Take 100 mg by mouth daily as needed for mild constipation.     gabapentin (NEURONTIN) 100 MG capsule Take 1 capsule (100 mg total) by mouth at bedtime. 90 capsule 1   insulin lispro (HUMALOG) 100 UNIT/ML injection Inject 8 Units into the skin 3 (three) times daily before meals.     meclizine (ANTIVERT) 12.5 MG tablet Take 12.5 mg by mouth 3 (three) times daily.     methocarbamol (ROBAXIN) 500 MG tablet Take 1 tablet (500 mg total) by mouth daily as needed for muscle spasms. 30 tablet 2   NOVOFINE 32G X 6 MM MISC USE AS DIRECTED THREE TIMES DAILY WITH HUMALOG 100 each 3   olmesartan (BENICAR) 40 MG tablet Take 1 tablet (40 mg total) by mouth daily. (Patient taking differently: Take 40 mg by mouth every morning.) 90 tablet 4   pantoprazole (PROTONIX) 40 MG tablet Take 1 tablet (40 mg total) by mouth daily. 90 tablet 3   pioglitazone (ACTOS) 30 MG tablet Take 30 mg by mouth daily.     rosuvastatin (CRESTOR) 5 MG tablet Take 1 tablet (5 mg total) by mouth daily. PLEASE CALL OFFICE TO SCHEDULE APPOINTMENT PRIOR TO NEXT REFILL 90 tablet 0   torsemide (DEMADEX) 20 MG tablet Take 2 tablets (40 mg total) by mouth daily. (Patient taking differently: Take 40 mg by mouth every morning.) 180 tablet 3   vitamin B-12 (CYANOCOBALAMIN) 100 MCG tablet Take 100 mcg by mouth daily.     Vitamin D, Cholecalciferol, 50 MCG (2000 UT) CAPS Take 2,000 Units by mouth daily.     naloxone (NARCAN) nasal spray 4 mg/0.1 mL SMARTSIG:Both Nares (Patient not taking: Reported on 07/07/2023)     No current facility-administered medications for this visit.     PHYSICAL  EXAMINATION: ECOG PERFORMANCE STATUS: 1 - Symptomatic but completely ambulatory Vitals:   07/07/23 1310  BP: 102/70  Pulse: 79  Resp: 18  Temp: (!) 97.5 F (36.4 C)   Filed Weights   07/07/23 1310  Weight: 138 lb 8 oz (62.8 kg)    Physical Exam Constitutional:      General: She is not in acute distress. HENT:     Head: Normocephalic and atraumatic.  Eyes:     General: No scleral icterus.    Pupils: Pupils are equal, round, and reactive to light.  Cardiovascular:     Rate and Rhythm: Normal rate.  Pulmonary:     Effort: Pulmonary effort is normal. No respiratory distress.     Breath sounds: No wheezing.  Abdominal:     General: Bowel sounds are normal. There is no distension.     Palpations: Abdomen is soft.  Musculoskeletal:        General: No deformity. Normal range of motion.     Cervical back: Normal range of motion and neck supple.  Skin:    General: Skin is warm and dry.     Findings: No erythema or rash.  Neurological:     Mental Status: She is alert and oriented to person, place, and time.     Cranial Nerves: No cranial nerve deficit.     Coordination: Coordination normal.  Psychiatric:  Mood and Affect: Mood normal.      LABORATORY DATA:  I have reviewed the data as listed    Latest Ref Rng & Units 07/07/2023   12:47 PM 06/01/2023    2:59 PM 05/27/2023   10:27 AM  CBC  WBC 4.0 - 10.5 K/uL 7.9     Hemoglobin 12.0 - 15.0 g/dL 9.3  9.7  9.8   Hematocrit 36.0 - 46.0 % 31.1  30.7  31.8   Platelets 150 - 400 K/uL 267         Latest Ref Rng & Units 01/27/2023    7:42 AM 12/22/2022    4:00 PM 08/27/2022    4:42 AM  CMP  Glucose 70 - 99 mg/dL  762  77   BUN 8 - 27 mg/dL  35  61   Creatinine 8.31 - 1.00 mg/dL 5.17  6.16  0.73   Sodium 134 - 144 mmol/L  143  144   Potassium 3.5 - 5.2 mmol/L  5.0  4.5   Chloride 96 - 106 mmol/L  110  118   CO2 20 - 29 mmol/L  21  21   Calcium 8.7 - 10.3 mg/dL  8.9  8.2   Total Protein 6.0 - 8.5 g/dL  5.5    Total  Bilirubin 0.0 - 1.2 mg/dL  <7.1    Alkaline Phos 44 - 121 IU/L  62    AST 0 - 40 IU/L  14    ALT 0 - 32 IU/L  7     Lab Results  Component Value Date   IRON 45 07/07/2023   TIBC 288 07/07/2023   FERRITIN 97 07/07/2023

## 2023-07-07 NOTE — Assessment & Plan Note (Addendum)
History of iron deficiency anemia,  anemia secondary to chronic kidney disease. Labs reviewed and discussed patient. Lab Results  Component Value Date   HGB 9.3 (L) 07/07/2023   TIBC 288 07/07/2023   IRONPCTSAT 16 07/07/2023   FERRITIN 97 07/07/2023     Retacrit 10,000 units x 1 today. Ferritin is <200, recommmend Venofer weekly x 2.  Recommend Retacrit every 5 weeks if hemoglobin is less than 10.

## 2023-07-07 NOTE — Telephone Encounter (Signed)
-----   Message from Rickard Patience sent at 07/07/2023  3:38 PM EDT ----- Please arrange her to get additional IV venofer weekly x 2. Discussed about the possibility Keep same follow up appts. Thanks.

## 2023-07-08 ENCOUNTER — Encounter: Payer: Self-pay | Admitting: Oncology

## 2023-07-13 DIAGNOSIS — M47812 Spondylosis without myelopathy or radiculopathy, cervical region: Secondary | ICD-10-CM | POA: Diagnosis not present

## 2023-07-14 MED FILL — Iron Sucrose Inj 20 MG/ML (Fe Equiv): INTRAVENOUS | Qty: 10 | Status: AC

## 2023-07-15 ENCOUNTER — Inpatient Hospital Stay: Payer: Medicare Other

## 2023-07-15 DIAGNOSIS — Z7984 Long term (current) use of oral hypoglycemic drugs: Secondary | ICD-10-CM | POA: Diagnosis not present

## 2023-07-15 DIAGNOSIS — I110 Hypertensive heart disease with heart failure: Secondary | ICD-10-CM | POA: Diagnosis not present

## 2023-07-15 DIAGNOSIS — E1159 Type 2 diabetes mellitus with other circulatory complications: Secondary | ICD-10-CM | POA: Diagnosis not present

## 2023-07-15 DIAGNOSIS — I5030 Unspecified diastolic (congestive) heart failure: Secondary | ICD-10-CM

## 2023-07-16 DIAGNOSIS — M5136 Other intervertebral disc degeneration, lumbar region: Secondary | ICD-10-CM | POA: Diagnosis not present

## 2023-07-16 DIAGNOSIS — R809 Proteinuria, unspecified: Secondary | ICD-10-CM | POA: Diagnosis not present

## 2023-07-16 DIAGNOSIS — E113393 Type 2 diabetes mellitus with moderate nonproliferative diabetic retinopathy without macular edema, bilateral: Secondary | ICD-10-CM | POA: Diagnosis not present

## 2023-07-16 DIAGNOSIS — E1121 Type 2 diabetes mellitus with diabetic nephropathy: Secondary | ICD-10-CM | POA: Diagnosis not present

## 2023-07-16 DIAGNOSIS — Z794 Long term (current) use of insulin: Secondary | ICD-10-CM | POA: Diagnosis not present

## 2023-07-16 DIAGNOSIS — E1122 Type 2 diabetes mellitus with diabetic chronic kidney disease: Secondary | ICD-10-CM | POA: Diagnosis not present

## 2023-07-16 DIAGNOSIS — N184 Chronic kidney disease, stage 4 (severe): Secondary | ICD-10-CM | POA: Diagnosis not present

## 2023-07-16 DIAGNOSIS — E1129 Type 2 diabetes mellitus with other diabetic kidney complication: Secondary | ICD-10-CM | POA: Diagnosis not present

## 2023-07-21 MED FILL — Iron Sucrose Inj 20 MG/ML (Fe Equiv): INTRAVENOUS | Qty: 10 | Status: AC

## 2023-07-22 ENCOUNTER — Inpatient Hospital Stay: Payer: Medicare Other

## 2023-07-23 ENCOUNTER — Inpatient Hospital Stay: Payer: Medicare Other

## 2023-07-27 ENCOUNTER — Inpatient Hospital Stay: Payer: Medicare Other | Attending: Oncology

## 2023-07-27 VITALS — BP 118/66 | HR 69 | Temp 97.9°F

## 2023-07-27 DIAGNOSIS — N184 Chronic kidney disease, stage 4 (severe): Secondary | ICD-10-CM | POA: Insufficient documentation

## 2023-07-27 DIAGNOSIS — D631 Anemia in chronic kidney disease: Secondary | ICD-10-CM | POA: Diagnosis not present

## 2023-07-27 MED ORDER — SODIUM CHLORIDE 0.9 % IV SOLN
Freq: Once | INTRAVENOUS | Status: AC
  Filled 2023-07-27: qty 250

## 2023-07-27 MED ORDER — SODIUM CHLORIDE 0.9 % IV SOLN
200.0000 mg | Freq: Once | INTRAVENOUS | Status: AC
  Administered 2023-07-27: 200 mg via INTRAVENOUS
  Filled 2023-07-27: qty 200

## 2023-07-29 DIAGNOSIS — M5412 Radiculopathy, cervical region: Secondary | ICD-10-CM | POA: Diagnosis not present

## 2023-07-30 ENCOUNTER — Other Ambulatory Visit: Payer: Self-pay | Admitting: Nurse Practitioner

## 2023-07-31 ENCOUNTER — Inpatient Hospital Stay
Admission: EM | Admit: 2023-07-31 | Discharge: 2023-08-06 | DRG: 391 | Disposition: A | Discharge status: Home / Self Care | Payer: Medicare Other | Attending: Surgery | Admitting: Surgery

## 2023-07-31 ENCOUNTER — Emergency Department: Payer: Medicare Other

## 2023-07-31 ENCOUNTER — Inpatient Hospital Stay: Payer: Medicare Other

## 2023-07-31 ENCOUNTER — Other Ambulatory Visit: Payer: Self-pay

## 2023-07-31 DIAGNOSIS — E11649 Type 2 diabetes mellitus with hypoglycemia without coma: Secondary | ICD-10-CM | POA: Diagnosis not present

## 2023-07-31 DIAGNOSIS — K631 Perforation of intestine (nontraumatic): Secondary | ICD-10-CM

## 2023-07-31 DIAGNOSIS — E1140 Type 2 diabetes mellitus with diabetic neuropathy, unspecified: Secondary | ICD-10-CM | POA: Diagnosis not present

## 2023-07-31 DIAGNOSIS — K5649 Other impaction of intestine: Secondary | ICD-10-CM | POA: Diagnosis not present

## 2023-07-31 DIAGNOSIS — Z5986 Financial insecurity: Secondary | ICD-10-CM

## 2023-07-31 DIAGNOSIS — K5720 Diverticulitis of large intestine with perforation and abscess without bleeding: Secondary | ICD-10-CM | POA: Diagnosis not present

## 2023-07-31 DIAGNOSIS — N184 Chronic kidney disease, stage 4 (severe): Secondary | ICD-10-CM | POA: Diagnosis not present

## 2023-07-31 DIAGNOSIS — E1122 Type 2 diabetes mellitus with diabetic chronic kidney disease: Secondary | ICD-10-CM | POA: Diagnosis not present

## 2023-07-31 DIAGNOSIS — E785 Hyperlipidemia, unspecified: Secondary | ICD-10-CM | POA: Diagnosis present

## 2023-07-31 DIAGNOSIS — I130 Hypertensive heart and chronic kidney disease with heart failure and stage 1 through stage 4 chronic kidney disease, or unspecified chronic kidney disease: Secondary | ICD-10-CM | POA: Diagnosis present

## 2023-07-31 DIAGNOSIS — I5030 Unspecified diastolic (congestive) heart failure: Secondary | ICD-10-CM | POA: Diagnosis not present

## 2023-07-31 DIAGNOSIS — Z7982 Long term (current) use of aspirin: Secondary | ICD-10-CM

## 2023-07-31 DIAGNOSIS — K219 Gastro-esophageal reflux disease without esophagitis: Secondary | ICD-10-CM | POA: Diagnosis not present

## 2023-07-31 DIAGNOSIS — Z833 Family history of diabetes mellitus: Secondary | ICD-10-CM

## 2023-07-31 DIAGNOSIS — I429 Cardiomyopathy, unspecified: Secondary | ICD-10-CM | POA: Diagnosis present

## 2023-07-31 DIAGNOSIS — Z8249 Family history of ischemic heart disease and other diseases of the circulatory system: Secondary | ICD-10-CM

## 2023-07-31 DIAGNOSIS — I10 Essential (primary) hypertension: Secondary | ICD-10-CM

## 2023-07-31 DIAGNOSIS — Z7902 Long term (current) use of antithrombotics/antiplatelets: Secondary | ICD-10-CM

## 2023-07-31 DIAGNOSIS — Z79899 Other long term (current) drug therapy: Secondary | ICD-10-CM

## 2023-07-31 DIAGNOSIS — K651 Peritoneal abscess: Secondary | ICD-10-CM | POA: Diagnosis not present

## 2023-07-31 DIAGNOSIS — I5032 Chronic diastolic (congestive) heart failure: Secondary | ICD-10-CM | POA: Diagnosis present

## 2023-07-31 DIAGNOSIS — R100 Acute abdomen: Secondary | ICD-10-CM | POA: Diagnosis not present

## 2023-07-31 DIAGNOSIS — Z794 Long term (current) use of insulin: Secondary | ICD-10-CM

## 2023-07-31 DIAGNOSIS — E113399 Type 2 diabetes mellitus with moderate nonproliferative diabetic retinopathy without macular edema, unspecified eye: Secondary | ICD-10-CM | POA: Diagnosis present

## 2023-07-31 DIAGNOSIS — Z807 Family history of other malignant neoplasms of lymphoid, hematopoietic and related tissues: Secondary | ICD-10-CM

## 2023-07-31 DIAGNOSIS — K5641 Fecal impaction: Secondary | ICD-10-CM | POA: Diagnosis not present

## 2023-07-31 DIAGNOSIS — G894 Chronic pain syndrome: Secondary | ICD-10-CM | POA: Diagnosis not present

## 2023-07-31 DIAGNOSIS — K449 Diaphragmatic hernia without obstruction or gangrene: Secondary | ICD-10-CM | POA: Diagnosis not present

## 2023-07-31 DIAGNOSIS — D631 Anemia in chronic kidney disease: Secondary | ICD-10-CM | POA: Diagnosis not present

## 2023-07-31 DIAGNOSIS — D869 Sarcoidosis, unspecified: Secondary | ICD-10-CM | POA: Diagnosis not present

## 2023-07-31 DIAGNOSIS — D638 Anemia in other chronic diseases classified elsewhere: Secondary | ICD-10-CM

## 2023-07-31 DIAGNOSIS — R109 Unspecified abdominal pain: Secondary | ICD-10-CM | POA: Diagnosis present

## 2023-07-31 DIAGNOSIS — Z888 Allergy status to other drugs, medicaments and biological substances status: Secondary | ICD-10-CM

## 2023-07-31 DIAGNOSIS — Z83438 Family history of other disorder of lipoprotein metabolism and other lipidemia: Secondary | ICD-10-CM

## 2023-07-31 DIAGNOSIS — E1169 Type 2 diabetes mellitus with other specified complication: Secondary | ICD-10-CM | POA: Diagnosis present

## 2023-07-31 DIAGNOSIS — Z7984 Long term (current) use of oral hypoglycemic drugs: Secondary | ICD-10-CM

## 2023-07-31 DIAGNOSIS — Z8673 Personal history of transient ischemic attack (TIA), and cerebral infarction without residual deficits: Secondary | ICD-10-CM | POA: Diagnosis not present

## 2023-07-31 DIAGNOSIS — Z823 Family history of stroke: Secondary | ICD-10-CM

## 2023-07-31 DIAGNOSIS — B9620 Unspecified Escherichia coli [E. coli] as the cause of diseases classified elsewhere: Secondary | ICD-10-CM | POA: Diagnosis present

## 2023-07-31 DIAGNOSIS — B0229 Other postherpetic nervous system involvement: Secondary | ICD-10-CM | POA: Diagnosis not present

## 2023-07-31 DIAGNOSIS — N200 Calculus of kidney: Secondary | ICD-10-CM | POA: Diagnosis not present

## 2023-07-31 DIAGNOSIS — R54 Age-related physical debility: Secondary | ICD-10-CM | POA: Diagnosis not present

## 2023-07-31 DIAGNOSIS — K5792 Diverticulitis of intestine, part unspecified, without perforation or abscess without bleeding: Principal | ICD-10-CM

## 2023-07-31 DIAGNOSIS — K5780 Diverticulitis of intestine, part unspecified, with perforation and abscess without bleeding: Secondary | ICD-10-CM | POA: Diagnosis not present

## 2023-07-31 LAB — HEPATIC FUNCTION PANEL
ALT: 14 U/L (ref 0–44)
AST: 18 U/L (ref 15–41)
Albumin: 3.4 g/dL — ABNORMAL LOW (ref 3.5–5.0)
Alkaline Phosphatase: 64 U/L (ref 38–126)
Bilirubin, Direct: <0.1 mg/dL (ref 0.0–0.2)
Total Bilirubin: 0.4 mg/dL (ref 0.3–1.2)
Total Protein: 6.7 g/dL (ref 6.5–8.1)

## 2023-07-31 LAB — BASIC METABOLIC PANEL
Anion gap: 10 (ref 5–15)
BUN: 59 mg/dL — ABNORMAL HIGH (ref 8–23)
CO2: 22 mmol/L (ref 22–32)
Calcium: 8.9 mg/dL (ref 8.9–10.3)
Chloride: 107 mmol/L (ref 98–111)
Creatinine, Ser: 2.28 mg/dL — ABNORMAL HIGH (ref 0.44–1.00)
GFR, Estimated: 22 mL/min — ABNORMAL LOW (ref >60)
Glucose, Bld: 279 mg/dL — ABNORMAL HIGH (ref 70–99)
Potassium: 4.4 mmol/L (ref 3.5–5.1)
Sodium: 139 mmol/L (ref 135–145)

## 2023-07-31 LAB — CBC
HCT: 35 % — ABNORMAL LOW (ref 36.0–46.0)
Hemoglobin: 10.8 g/dL — ABNORMAL LOW (ref 12.0–15.0)
MCH: 27.1 pg (ref 26.0–34.0)
MCHC: 30.9 g/dL (ref 30.0–36.0)
MCV: 87.9 fL (ref 80.0–100.0)
Platelets: 317 10*3/uL (ref 150–400)
RBC: 3.98 MIL/uL (ref 3.87–5.11)
RDW: 15.3 % (ref 11.5–15.5)
WBC: 12.9 10*3/uL — ABNORMAL HIGH (ref 4.0–10.5)
nRBC: 0 % (ref 0.0–0.2)

## 2023-07-31 LAB — URINALYSIS, ROUTINE W REFLEX MICROSCOPIC
Bilirubin Urine: NEGATIVE
Glucose, UA: 50 mg/dL — AB
Ketones, ur: NEGATIVE mg/dL
Leukocytes,Ua: NEGATIVE
Nitrite: NEGATIVE
Protein, ur: 100 mg/dL — AB
Specific Gravity, Urine: 1.013 (ref 1.005–1.030)
pH: 5 (ref 5.0–8.0)

## 2023-07-31 LAB — GLUCOSE, CAPILLARY
Glucose-Capillary: 166 mg/dL — ABNORMAL HIGH (ref 70–99)
Glucose-Capillary: 242 mg/dL — ABNORMAL HIGH (ref 70–99)
Glucose-Capillary: 280 mg/dL — ABNORMAL HIGH (ref 70–99)

## 2023-07-31 LAB — LIPASE, BLOOD: Lipase: 37 U/L (ref 11–51)

## 2023-07-31 MED ORDER — MECLIZINE HCL 25 MG PO TABS
12.5000 mg | ORAL_TABLET | Freq: Three times a day (TID) | ORAL | Status: DC
  Administered 2023-07-31 – 2023-08-06 (×17): 12.5 mg via ORAL
  Filled 2023-07-31 (×18): qty 0.5

## 2023-07-31 MED ORDER — SODIUM CHLORIDE 0.9 % IV SOLN: INTRAVENOUS | Status: DC

## 2023-07-31 MED ORDER — ONDANSETRON HCL 4 MG/2ML IJ SOLN
4.0000 mg | Freq: Once | INTRAMUSCULAR | Status: AC
  Administered 2023-07-31: 4 mg via INTRAVENOUS
  Filled 2023-07-31: qty 2

## 2023-07-31 MED ORDER — HYDROMORPHONE HCL 1 MG/ML IJ SOLN
0.5000 mg | Freq: Once | INTRAMUSCULAR | Status: AC
  Administered 2023-07-31: 0.5 mg via INTRAVENOUS
  Filled 2023-07-31: qty 0.5

## 2023-07-31 MED ORDER — FENTANYL CITRATE (PF) 100 MCG/2ML IJ SOLN
INTRAMUSCULAR | Status: AC
  Filled 2023-07-31: qty 2

## 2023-07-31 MED ORDER — CARVEDILOL 6.25 MG PO TABS
3.1250 mg | ORAL_TABLET | Freq: Two times a day (BID) | ORAL | Status: DC
  Administered 2023-07-31 – 2023-08-06 (×12): 3.125 mg via ORAL
  Filled 2023-07-31 (×12): qty 1

## 2023-07-31 MED ORDER — PANTOPRAZOLE SODIUM 40 MG PO TBEC
40.0000 mg | DELAYED_RELEASE_TABLET | Freq: Every day | ORAL | Status: DC
  Administered 2023-07-31 – 2023-08-06 (×7): 40 mg via ORAL
  Filled 2023-07-31 (×7): qty 1

## 2023-07-31 MED ORDER — ONDANSETRON HCL 4 MG/2ML IJ SOLN: 4.0000 mg | Freq: Four times a day (QID) | INTRAMUSCULAR | Status: DC | PRN

## 2023-07-31 MED ORDER — ACETAMINOPHEN 325 MG PO TABS
650.0000 mg | ORAL_TABLET | Freq: Three times a day (TID) | ORAL | Status: DC | PRN
  Administered 2023-08-01 – 2023-08-03 (×4): 650 mg via ORAL
  Filled 2023-07-31 (×4): qty 2

## 2023-07-31 MED ORDER — SODIUM CHLORIDE 0.9% FLUSH
5.0000 mL | Freq: Three times a day (TID) | INTRAVENOUS | Status: DC
  Administered 2023-07-31 – 2023-08-06 (×18): 5 mL

## 2023-07-31 MED ORDER — HYDRALAZINE HCL 20 MG/ML IJ SOLN
10.0000 mg | INTRAMUSCULAR | Status: DC | PRN
  Administered 2023-08-05: 10 mg via INTRAVENOUS
  Filled 2023-07-31: qty 1

## 2023-07-31 MED ORDER — VITAMIN D 25 MCG (1000 UNIT) PO TABS
2000.0000 [IU] | ORAL_TABLET | Freq: Every day | ORAL | Status: DC
  Administered 2023-08-01 – 2023-08-06 (×6): 2000 [IU] via ORAL
  Filled 2023-07-31 (×6): qty 2

## 2023-07-31 MED ORDER — IRBESARTAN 150 MG PO TABS
300.0000 mg | ORAL_TABLET | Freq: Every day | ORAL | Status: DC
  Administered 2023-07-31 – 2023-08-01 (×2): 300 mg via ORAL
  Filled 2023-07-31 (×2): qty 2

## 2023-07-31 MED ORDER — INSULIN ASPART 100 UNIT/ML IJ SOLN
0.0000 [IU] | INTRAMUSCULAR | Status: DC
  Administered 2023-07-31: 5 [IU] via SUBCUTANEOUS
  Administered 2023-08-01: 2 [IU] via SUBCUTANEOUS
  Administered 2023-08-01: 1 [IU] via SUBCUTANEOUS
  Administered 2023-08-02 – 2023-08-03 (×2): 2 [IU] via SUBCUTANEOUS
  Filled 2023-07-31 (×5): qty 1

## 2023-07-31 MED ORDER — MIDAZOLAM HCL 2 MG/2ML IJ SOLN
INTRAMUSCULAR | Status: AC | PRN
Start: 2023-07-31 — End: 2023-07-31
  Administered 2023-07-31: 1 mg via INTRAVENOUS

## 2023-07-31 MED ORDER — LIDOCAINE 1 % OPTIME INJ - NO CHARGE
8.0000 mL | Freq: Once | INTRAMUSCULAR | Status: DC
  Filled 2023-07-31: qty 8

## 2023-07-31 MED ORDER — PIPERACILLIN-TAZOBACTAM IN DEX 2-0.25 GM/50ML IV SOLN
2.2500 g | Freq: Three times a day (TID) | INTRAVENOUS | Status: DC
  Administered 2023-07-31 – 2023-08-02 (×5): 2.25 g via INTRAVENOUS
  Filled 2023-07-31 (×7): qty 50

## 2023-07-31 MED ORDER — HEPARIN SODIUM (PORCINE) 5000 UNIT/ML IJ SOLN
5000.0000 [IU] | Freq: Three times a day (TID) | INTRAMUSCULAR | Status: DC
  Administered 2023-07-31 – 2023-08-06 (×17): 5000 [IU] via SUBCUTANEOUS
  Filled 2023-07-31 (×17): qty 1

## 2023-07-31 MED ORDER — FENTANYL CITRATE (PF) 100 MCG/2ML IJ SOLN
INTRAMUSCULAR | Status: AC | PRN
Start: 2023-07-31 — End: 2023-07-31
  Administered 2023-07-31: 25 ug via INTRAVENOUS
  Administered 2023-07-31: 50 ug via INTRAVENOUS

## 2023-07-31 MED ORDER — MIDAZOLAM HCL 2 MG/2ML IJ SOLN
INTRAMUSCULAR | Status: AC
  Filled 2023-07-31: qty 2

## 2023-07-31 MED ORDER — TORSEMIDE 20 MG PO TABS
40.0000 mg | ORAL_TABLET | Freq: Every day | ORAL | Status: DC
  Administered 2023-08-01: 40 mg via ORAL
  Filled 2023-07-31: qty 2

## 2023-07-31 MED ORDER — MORPHINE SULFATE (PF) 4 MG/ML IV SOLN
4.0000 mg | Freq: Once | INTRAVENOUS | Status: AC
  Administered 2023-07-31: 4 mg via INTRAVENOUS
  Filled 2023-07-31: qty 1

## 2023-07-31 MED ORDER — PIPERACILLIN-TAZOBACTAM 3.375 G IVPB 30 MIN
3.3750 g | Freq: Once | INTRAVENOUS | Status: AC
  Administered 2023-07-31: 3.375 g via INTRAVENOUS
  Filled 2023-07-31: qty 50

## 2023-07-31 MED ORDER — METHOCARBAMOL 500 MG PO TABS
500.0000 mg | ORAL_TABLET | Freq: Every day | ORAL | Status: DC | PRN
  Administered 2023-08-01 – 2023-08-03 (×2): 500 mg via ORAL
  Filled 2023-07-31 (×3): qty 1

## 2023-07-31 MED ORDER — MORPHINE SULFATE (PF) 2 MG/ML IV SOLN
2.0000 mg | INTRAVENOUS | Status: DC | PRN
  Administered 2023-07-31 – 2023-08-04 (×6): 2 mg via INTRAVENOUS
  Filled 2023-07-31 (×7): qty 1

## 2023-07-31 MED ORDER — ROSUVASTATIN CALCIUM 10 MG PO TABS
5.0000 mg | ORAL_TABLET | Freq: Every day | ORAL | Status: DC
  Administered 2023-07-31 – 2023-08-06 (×7): 5 mg via ORAL
  Filled 2023-07-31 (×7): qty 1

## 2023-07-31 MED ORDER — LACTATED RINGERS IV BOLUS
1000.0000 mL | Freq: Once | INTRAVENOUS | Status: AC
  Administered 2023-07-31: 1000 mL via INTRAVENOUS

## 2023-07-31 MED ORDER — VITAMIN B-12 100 MCG PO TABS
100.0000 ug | ORAL_TABLET | Freq: Every day | ORAL | Status: DC
  Administered 2023-08-01 – 2023-08-06 (×6): 100 ug via ORAL
  Filled 2023-07-31 (×6): qty 1

## 2023-07-31 MED ORDER — GABAPENTIN 100 MG PO CAPS
100.0000 mg | ORAL_CAPSULE | Freq: Every day | ORAL | Status: DC
  Administered 2023-07-31 – 2023-08-05 (×6): 100 mg via ORAL
  Filled 2023-07-31 (×6): qty 1

## 2023-07-31 MED ORDER — ONDANSETRON 4 MG PO TBDP: 4.0000 mg | ORAL_TABLET | Freq: Four times a day (QID) | ORAL | Status: DC | PRN

## 2023-07-31 NOTE — Assessment & Plan Note (Signed)
Remote history of CVA in December 2020 Noted significant deficits are present Continue home regimen perioperatively

## 2023-07-31 NOTE — Assessment & Plan Note (Deleted)
Creatinine 2.2 today with baseline creatinine around 2.2-2.8. At baseline Monitor Minimize nephrotoxic agents

## 2023-07-31 NOTE — Procedures (Signed)
Interventional Radiology Procedure Note  Date of Procedure: 07/31/2023  Procedure: CT drain placement   Findings:  1. CT guided drain placement 10 Fr into LLQ abscess  2. ~68ml purulent fluid aspirated and sent for labs    Complications: No immediate complications noted.   Estimated Blood Loss: minimal  Follow-up and Recommendations: 1. Keep to bulb  2. Flush x3 daily  3. Follow up per surgery    Olive Bass, MD  Vascular & Interventional Radiology  07/31/2023 5:04 PM

## 2023-07-31 NOTE — Assessment & Plan Note (Signed)
Cont statin

## 2023-07-31 NOTE — Consult Note (Addendum)
Initial Consultation Note   Patient: Tonya [name]:[id number] DOB: Dec 04, 1946 PCP: Marjie Skiff, NP DOA: 07/31/2023 DOS: the patient was seen and examined on 07/31/2023 Primary service: Campbell Lerner, MD  Referring physician: Claudine Mouton  Reason for consult: Medical co-management   Assessment/Plan: Assessment and Plan: * Diverticulitis of colon with perforation Acute severe lower abdominal pain with noted Pericolonic 2.6 x 1.8 x 3.4 cm fluid collection along the anti mesenteric aspect of the distal descending colon most concerning for an abscess on imaging Pending IR drainage per surgery recommendations Management as per primary team general surgery  Chronic heart failure with preserved ejection fraction (HFpEF) (HCC) 2D echo December 2020 with EF of 60 to 65% and grade 1 diastolic dysfunction Appears mildly dry to euvolemic on exam Monitor volume status in setting of perforated diverticulitis Continue home regimen Follow  Anemia in stage 4 chronic kidney disease (HCC) Hgb 11  Baseline hgb 10-11 Monitor    History of CVA (cerebrovascular accident) Remote history of CVA in December 2020 Noted significant deficits are present Continue home regimen perioperatively  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 At baseline Monitor Minimize nephrotoxic agents  Hyperlipidemia associated with type 2 diabetes mellitus (HCC) Cont statin         [facility] will continue to follow the patient.  HPI: ZAILA [name] is a 77 Tonya.o. female with past medical history of HFpEF, stage IV CKD, anemia of chronic disease, history of CVA with without deficits, GERD, hypertension, hyperlipidemia presenting with perforated diverticulitis with abscess.  Patient reports severe onset of lower abdominal pain earlier  today.  Pain unbearable.  Positive nausea.  No chest pain or shortness of breath.  No hemiparesis or confusion.  Denies any prior episodes like this in the past.. Presented to the ER afebrile, heart rate 100s, BP 140s to 160s over 70s to 80s.  Satting well on room air.  White count 13, hemoglobin 11, platelets 317, creatinine 2.3, glucose 279.  CT imaging with thickening of the distal descending colon sigmoid junction most consistent with perforated diverticulitis with pericolonic 2.6 x 1.8 x 3.4 cm fluid collection along the anterior mesenteric aspect of the distal descending colon concerning for abscess.  Positive rectal fecal impaction with dilated rectum measuring 8.6 cm.  Dr. Claudine Mouton with general surgery admitting the patient for management. Review of Systems: As mentioned in the history of present illness. All other systems reviewed and are negative. Past Medical History:  Diagnosis Date   (HFpEF) heart failure with preserved ejection fraction (HCC)    a.) TTE 05/29/2017: EF 55-60%, mild LAE, G1DD; b.) TTE 09/07/2017: EF >55%, mild LAE, Ao sclerosis without stenosis, degen MV disease, G2DD; c.) TTE 12/02/2018: EF 55-60%, mild LVH, Mild BAE, triv TR, G1DD; d.) TTE 11/22/2019: EF 60-65%, mod LVH, mild TR, G1DD   Anemia in stage 4 chronic kidney disease (HCC)    Arthritis    Cardiomyopathy (HCC)    CKD (chronic kidney disease) stage 4, GFR 15-29 ml/min (HCC)    CVA (cerebral vascular accident) (HCC) 08/2017   a.) no residual deficits   Diabetic neuropathy (HCC)    Diverticulitis    GERD (gastroesophageal reflux disease)    History of hiatal hernia    Hyperlipidemia    Hypertension    Long term current use of antithrombotics/antiplatelets    a.) DAPT  therapy (ASA + clopidogrel)   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)    Paraesophageal hernia    PHN (postherpetic neuralgia)    Sarcoidosis    Shingles    Type 2 diabetes mellitus treated with insulin (HCC)     Wears dentures    partial upper   Past Surgical History:  Procedure Laterality Date   CATARACT EXTRACTION W/PHACO Left 02/05/2021   Procedure: CATARACT EXTRACTION PHACO AND INTRAOCULAR LENS PLACEMENT (IOC) LEFT DIABETIC 5.47 00:51.4;  Surgeon: Galen Manila, MD;  Location: [facility];  Service: Ophthalmology;  Laterality: Left;  Diabetic - insulin and oral meds   CATARACT EXTRACTION W/PHACO Right 03/19/2021   Procedure: CATARACT EXTRACTION PHACO AND INTRAOCULAR LENS PLACEMENT (IOC) RIGHT DIABETIC 6.09 00:39.6;  Surgeon: Galen Manila, MD;  Location: [facility];  Service: Ophthalmology;  Laterality: Right;   COLONOSCOPY  12/15/2006   COLONOSCOPY WITH PROPOFOL N/A 06/06/2019   Procedure: COLONOSCOPY WITH PROPOFOL;  Surgeon: Wyline Mood, MD;  Location: [facility];  Service: Gastroenterology;  Laterality: N/A;   COLONOSCOPY WITH PROPOFOL N/A 01/16/2020   Procedure: COLONOSCOPY WITH BIOPSY;  Surgeon: Wyline Mood, MD;  Location: [facility];  Service: Endoscopy;  Laterality: N/A;  Diabetic - insulin and oral meds   ESOPHAGOGASTRODUODENOSCOPY (EGD) WITH PROPOFOL N/A 06/23/2022   Procedure: ESOPHAGOGASTRODUODENOSCOPY (EGD) WITH PROPOFOL;  Surgeon: Wyline Mood, MD;  Location: [facility];  Service: Gastroenterology;  Laterality: N/A;   EYE SURGERY     INSERTION OF MESH  08/26/2022   Procedure: INSERTION OF MESH;  Surgeon: Leafy Ro, MD;  Location: [facility];  Service: General;;   POLYPECTOMY N/A 01/16/2020   Procedure: POLYPECTOMY;  Surgeon: Wyline Mood, MD;  Location: [facility];  Service: Endoscopy;  Laterality: N/A;   TUBAL LIGATION     XI ROBOTIC ASSISTED PARAESOPHAGEAL HERNIA REPAIR N/A 08/26/2022   Procedure: XI ROBOTIC ASSISTED PARAESOPHAGEAL HERNIA REPAIR, RNFA to assist;  Surgeon: Leafy Ro, MD;  Location: [facility];  Service: General;  Laterality: N/A;   Social History:  reports that she has never smoked. She has never been exposed  to tobacco smoke. She has never used smokeless tobacco. She reports that she does not drink alcohol and does not use drugs.  Allergies  Allergen Reactions   Atorvastatin     Myalgias    Gabapentin Other (See Comments)    Speech impairment    Pravastatin     Myalgias    Pregabalin     Speech impairment   Trulicity [Dulaglutide] Hives    Family History  Problem Relation Age of Onset   Diabetes Mother    Stroke Mother    Hyperlipidemia Father    Hypertension Father    Diabetes Sister    Diabetes Brother    Gout Son    Diabetes Brother    Kidney disease Son    Lymphoma Sister    Heart attack Sister    Breast cancer Neg Hx     Prior to Admission medications   Medication Sig Start Date End Date Taking? Authorizing Provider  acetaminophen (TYLENOL) 650 MG CR tablet Take 650 mg by mouth every 8 (eight) hours as needed for pain.    [provider]  aspirin 81 MG tablet Take 81 mg by mouth daily.    [provider]  carvedilol (COREG) 3.125 MG tablet Take 1 tablet (3.125 mg total) by mouth 2 (two) times daily with a meal. PLEASE CALL OFFICE TO SCHEDULE APPOINTMENT PRIOR TO  NEXT REFILL 06/15/23   Antonieta Iba, MD  diclofenac Sodium (VOLTAREN) 1 % GEL Apply 4 g topically 4 (four) times daily. 07/06/23   Pearley, Sherran Needs, NP  docusate sodium (COLACE) 100 MG capsule Take 100 mg by mouth daily as needed for mild constipation.    [provider]  gabapentin (NEURONTIN) 100 MG capsule Take 1 capsule (100 mg total) by mouth at bedtime. 02/25/23   Cannady, Corrie Dandy T, NP  insulin lispro (HUMALOG) 100 UNIT/ML injection Inject 8 Units into the skin 3 (three) times daily before meals.    [provider]  meclizine (ANTIVERT) 12.5 MG tablet Take 12.5 mg by mouth 3 (three) times daily. 12/22/22   [provider]  methocarbamol (ROBAXIN) 500 MG tablet Take 1 tablet (500 mg total) by mouth daily as needed for muscle spasms. 10/23/22   Edward Jolly,  MD  naloxone [facility]) nasal spray 4 mg/0.1 mL SMARTSIG:Both Nares Patient not taking: Reported on 07/07/2023 08/27/22   [provider]  NOVOFINE 32G X 6 MM MISC USE AS DIRECTED THREE TIMES DAILY WITH HUMALOG 06/19/20   Cannady, [name] T, NP  olmesartan (BENICAR) 40 MG tablet Take 1 tablet (40 mg total) by mouth daily. Patient taking differently: Take 40 mg by mouth every morning. 08/07/20   Cannady, Corrie Dandy T, NP  pantoprazole (PROTONIX) 40 MG tablet Take 1 tablet (40 mg total) by mouth daily. 01/28/23   Pabon, Diego F, MD  pioglitazone (ACTOS) 30 MG tablet Take 30 mg by mouth daily.    [provider]  rosuvastatin (CRESTOR) 5 MG tablet Take 1 tablet (5 mg total) by mouth daily. PLEASE CALL OFFICE TO SCHEDULE APPOINTMENT PRIOR TO NEXT REFILL 06/15/23   Antonieta Iba, MD  torsemide (DEMADEX) 20 MG tablet Take 2 tablets (40 mg total) by mouth daily. Patient taking differently: Take 40 mg by mouth every morning. 07/01/22   Antonieta Iba, MD  vitamin B-12 (CYANOCOBALAMIN) 100 MCG tablet Take 100 mcg by mouth daily.    [provider]  Vitamin D, Cholecalciferol, 50 MCG (2000 UT) CAPS Take 2,000 Units by mouth daily.    [provider]    Physical Exam: Vitals:   07/31/23 1649 07/31/23 1654 07/31/23 1659 07/31/23 1717  BP: (!) 169/89 126/76 138/72 (!) 158/80  Pulse: (!) 102 99 99 (!) 101  Resp: 14 15 12 13   Temp:      TempSrc:      SpO2: 100% 99% 100% 97%  Weight:       Physical Exam Constitutional:      Appearance: She is normal weight.  HENT:     Head: Normocephalic and atraumatic.     Nose: Nose normal.     Mouth/Throat:     Mouth: Mucous membranes are dry.  Eyes:     Extraocular Movements: Extraocular movements intact.     Pupils: Pupils are equal, round, and reactive to light.  Cardiovascular:     Rate and Rhythm: Normal rate and regular rhythm.  Pulmonary:     Effort: Pulmonary effort is normal.  Abdominal:     Comments: Marked  generalized abdominal tenderness to palpation on exam  Musculoskeletal:        General: Normal range of motion.     Cervical back: Normal range of motion.  Skin:    General: Skin is dry.  Neurological:     General: No focal deficit present.  Psychiatric:        Mood and Affect:  Mood normal.     Data Reviewed:   There are no new results to review at this time.   Greater than 50% was spent in counseling and coordination of care with patient Total encounter time 75 minutes or more  Family Communication: Husband at the bedside  Primary team communication: yes  Thank you very much for involving [place] in the care of your patient.  Author: Floydene Flock, MD 07/31/2023 5:35 PM  For on call review [email or web address].

## 2023-07-31 NOTE — Consult Note (Signed)
Pharmacy Antibiotic Note  Tonya [name] is a 77 y.o. female admitted on 07/31/2023 with diverticulitis with perforation.  Pharmacy has been consulted for Zosyn dosing.  Zosyn 3.375 grams IV x 1 given 8/16 @ 1444  Plan: eCrCl < 20 ml/min Start Zosyn 2.25 grams IV every 8 hours Follow renal function for adjustments  Weight: 59 kg (130 lb)  Temp (24hrs), Avg:99.1 F (37.3 C), Min:98.7 F (37.1 C), Max:99.4 F (37.4 C)  Recent Labs  Lab 07/31/23 1044  WBC 12.9*  CREATININE 2.28*    Estimated Creatinine Clearance: 19.9 mL/min (A) (by C-G formula based on SCr of 2.28 mg/dL (H)).    Allergies  Allergen Reactions   Atorvastatin     Myalgias    Gabapentin Other (See Comments)    Speech impairment    Pravastatin     Myalgias    Pregabalin     Speech impairment   Trulicity [Dulaglutide] Hives    Antimicrobials this admission: Zosyn 8/16 >>    Dose adjustments this admission: N/A  Microbiology results: N/A  Thank you for allowing pharmacy to be a part of this patient's care.  Barrie Folk, PharmD 07/31/2023 3:37 PM

## 2023-07-31 NOTE — Consult Note (Signed)
Chief Complaint: Patient was seen in consultation today for diverticular abscess  Referring Physician(s): Dr. Claudine Mouton  Supervising Physician: Pernell Dupre  Patient Status: [facility] - In-pt  History of Present Illness: Tonya [name] is a 77 y.o. female with past medical history of anemia, arthritis, cardiomyopathy, CKD, GERD, HTN, DM diverticulitis who presented to [facility] ED with acute abdominal pain.  CT Abdomen Pelvis showed: 1. Thickening of the distal descending colon-sigmoid junction most consistent with perforated diverticulitis. Pericolonic 2.6 x 1.8 x 3.4 cm fluid collection along the anti mesenteric aspect of the distal descending colon most concerning for an abscess. Small bubbles of extraluminal air adjacent to the fluid collection concerning for small volume pneumoperitoneum from bowel perforation. 2. Rectal fecal impaction with a dilated rectum measuring up to 8.6 cm.  IR consulted for aspiration and drainage of intra-abdominal fluid collection.    Past Medical History:  Diagnosis Date   (HFpEF) heart failure with preserved ejection fraction (HCC)    a.) TTE 05/29/2017: EF 55-60%, mild LAE, G1DD; b.) TTE 09/07/2017: EF >55%, mild LAE, Ao sclerosis without stenosis, degen MV disease, G2DD; c.) TTE 12/02/2018: EF 55-60%, mild LVH, Mild BAE, triv TR, G1DD; d.) TTE 11/22/2019: EF 60-65%, mod LVH, mild TR, G1DD   Anemia in stage 4 chronic kidney disease (HCC)    Arthritis    Cardiomyopathy (HCC)    CKD (chronic kidney disease) stage 4, GFR 15-29 ml/min (HCC)    CVA (cerebral vascular accident) (HCC) 08/2017   a.) no residual deficits   Diabetic neuropathy (HCC)    Diverticulitis    GERD (gastroesophageal reflux disease)    History of hiatal hernia    Hyperlipidemia    Hypertension    Long term current use of antithrombotics/antiplatelets    a.) DAPT therapy (ASA + clopidogrel)   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)    Paraesophageal  hernia    PHN (postherpetic neuralgia)    Sarcoidosis    Shingles    Type 2 diabetes mellitus treated with insulin (HCC)    Wears dentures    partial upper    Past Surgical History:  Procedure Laterality Date   CATARACT EXTRACTION W/PHACO Left 02/05/2021   Procedure: CATARACT EXTRACTION PHACO AND INTRAOCULAR LENS PLACEMENT (IOC) LEFT DIABETIC 5.47 00:51.4;  Surgeon: Galen Manila, MD;  Location: [facility];  Service: Ophthalmology;  Laterality: Left;  Diabetic - insulin and oral meds   CATARACT EXTRACTION W/PHACO Right 03/19/2021   Procedure: CATARACT EXTRACTION PHACO AND INTRAOCULAR LENS PLACEMENT (IOC) RIGHT DIABETIC 6.09 00:39.6;  Surgeon: Galen Manila, MD;  Location: [facility];  Service: Ophthalmology;  Laterality: Right;   COLONOSCOPY  12/15/2006   COLONOSCOPY WITH PROPOFOL N/A 06/06/2019   Procedure: COLONOSCOPY WITH PROPOFOL;  Surgeon: Wyline Mood, MD;  Location: [facility];  Service: Gastroenterology;  Laterality: N/A;   COLONOSCOPY WITH PROPOFOL N/A 01/16/2020   Procedure: COLONOSCOPY WITH BIOPSY;  Surgeon: Wyline Mood, MD;  Location: [facility];  Service: Endoscopy;  Laterality: N/A;  Diabetic - insulin and oral meds   ESOPHAGOGASTRODUODENOSCOPY (EGD) WITH PROPOFOL N/A 06/23/2022   Procedure: ESOPHAGOGASTRODUODENOSCOPY (EGD) WITH PROPOFOL;  Surgeon: Wyline Mood, MD;  Location: [facility];  Service: Gastroenterology;  Laterality: N/A;   EYE SURGERY     INSERTION OF MESH  08/26/2022   Procedure: INSERTION OF MESH;  Surgeon: Leafy Ro, MD;  Location: [facility];  Service: General;;   POLYPECTOMY N/A 01/16/2020   Procedure: POLYPECTOMY;  Surgeon: Wyline Mood, MD;  Location: [facility];  Service: Endoscopy;  Laterality: N/A;   TUBAL LIGATION     XI ROBOTIC ASSISTED PARAESOPHAGEAL HERNIA REPAIR N/A 08/26/2022   Procedure: XI ROBOTIC ASSISTED PARAESOPHAGEAL HERNIA REPAIR, RNFA to assist;  Surgeon: Leafy Ro, MD;  Location:  [facility] OR[facility];  Service: General;  Laterality: N/A;    Allergies: Atorvastatin, Gabapentin, Pravastatin, Pregabalin, and Trulicity [dulaglutide]  Medications: Prior to Admission medications   Medication Sig Start Date End Date Taking? Authorizing Provider  acetaminophen (TYLENOL) 650 MG CR tablet Take 650 mg by mouth every 8 (eight) hours as needed for pain.    [provider]  aspirin 81 MG tablet Take 81 mg by mouth daily.    [provider]  carvedilol (COREG) 3.125 MG tablet Take 1 tablet (3.125 mg total) by mouth 2 (two) times daily with a meal. PLEASE CALL OFFICE TO SCHEDULE APPOINTMENT PRIOR TO NEXT REFILL 06/15/23   Antonieta Iba, MD  diclofenac Sodium (VOLTAREN) 1 % GEL Apply 4 g topically 4 (four) times daily. 07/06/23   Pearley, Sherran Needs, NP  docusate sodium (COLACE) 100 MG capsule Take 100 mg by mouth daily as needed for mild constipation.    [provider]  gabapentin (NEURONTIN) 100 MG capsule Take 1 capsule (100 mg total) by mouth at bedtime. 02/25/23   Cannady, Corrie Dandy T, NP  insulin lispro (HUMALOG) 100 UNIT/ML injection Inject 8 Units into the skin 3 (three) times daily before meals.    [provider]  meclizine (ANTIVERT) 12.5 MG tablet Take 12.5 mg by mouth 3 (three) times daily. 12/22/22   [provider]  methocarbamol (ROBAXIN) 500 MG tablet Take 1 tablet (500 mg total) by mouth daily as needed for muscle spasms. 10/23/22   Edward Jolly, MD  naloxone [facility]) nasal spray 4 mg/0.1 mL SMARTSIG:Both Nares Patient not taking: Reported on 07/07/2023 08/27/22   [provider]  NOVOFINE 32G X 6 MM MISC USE AS DIRECTED THREE TIMES DAILY WITH HUMALOG 06/19/20   Cannady, [name] T, NP  olmesartan (BENICAR) 40 MG tablet Take 1 tablet (40 mg total) by mouth daily. Patient taking differently: Take 40 mg by mouth every morning. 08/07/20   Cannady, Corrie Dandy T, NP  pantoprazole (PROTONIX) 40 MG tablet Take 1 tablet (40 mg total) by mouth  daily. 01/28/23   Pabon, Diego F, MD  pioglitazone (ACTOS) 30 MG tablet Take 30 mg by mouth daily.    [provider]  rosuvastatin (CRESTOR) 5 MG tablet Take 1 tablet (5 mg total) by mouth daily. PLEASE CALL OFFICE TO SCHEDULE APPOINTMENT PRIOR TO NEXT REFILL 06/15/23   Antonieta Iba, MD  torsemide (DEMADEX) 20 MG tablet Take 2 tablets (40 mg total) by mouth daily. Patient taking differently: Take 40 mg by mouth every morning. 07/01/22   Antonieta Iba, MD  vitamin B-12 (CYANOCOBALAMIN) 100 MCG tablet Take 100 mcg by mouth daily.    [provider]  Vitamin D, Cholecalciferol, 50 MCG (2000 UT) CAPS Take 2,000 Units by mouth daily.    [provider]     Family History  Problem Relation Age of Onset   Diabetes Mother    Stroke Mother    Hyperlipidemia Father    Hypertension Father    Diabetes Sister    Diabetes Brother    Gout Son    Diabetes Brother    Kidney disease Son    Lymphoma Sister    Heart attack Sister    Breast cancer Neg  Hx     Social History   Socioeconomic History   Marital status: Married    Spouse name: Not on file   Number of children: Not on file   Years of education: Not on file   Highest education level: GED or equivalent  Occupational History   Occupation: retired  Tobacco Use   Smoking status: Never    Passive exposure: Never   Smokeless tobacco: Never  Vaping Use   Vaping status: Never Used  Substance and Sexual Activity   Alcohol use: No    Alcohol/week: 0.0 standard drinks of alcohol   Drug use: No   Sexual activity: Yes  Other Topics Concern   Not on file  Social History Narrative   Not on file   Social Determinants of Health   Financial Resource Strain: High Risk (04/13/2023)   Overall Financial Resource Strain (CARDIA)    Difficulty of Paying Living Expenses: Hard  Food Insecurity: No Food Insecurity (02/17/2023)   Hunger Vital Sign    Worried About Running Out of Food in the Last Year: Never true     Ran Out of Food in the Last Year: Never true  Transportation Needs: No Transportation Needs (04/13/2023)   PRAPARE - Administrator, Civil Service (Medical): No    Lack of Transportation (Non-Medical): No  Physical Activity: Inactive (02/17/2023)   Exercise Vital Sign    Days of Exercise per Week: 0 days    Minutes of Exercise per Session: 0 min  Stress: No Stress Concern Present (02/17/2023)   [employer] of Occupational Health - Occupational Stress Questionnaire    Feeling of Stress : Not at all  Social Connections: Moderately Isolated (02/17/2023)   Social Connection and Isolation Panel [NHANES]    Frequency of Communication with Friends and Family: More than three times a week    Frequency of Social Gatherings with Friends and Family: Three times a week    Attends Religious Services: Never    Active Member of Clubs or Organizations: No    Attends Banker Meetings: Never    Marital Status: Married     Review of Systems: A 12 point ROS discussed and pertinent positives are indicated in the HPI above.  All other systems are negative.  Review of Systems  Constitutional:  Negative for fatigue and fever.  Respiratory:  Negative for cough and shortness of breath.   Cardiovascular:  Negative for chest pain.  Gastrointestinal:  Positive for abdominal pain, constipation, nausea and rectal pain. Negative for vomiting.  Musculoskeletal:  Negative for back pain.  Psychiatric/Behavioral:  Negative for behavioral problems and confusion.     Vital Signs: BP (!) 146/79 (BP Location: Left Arm)   Pulse (!) 102   Temp 98.7 F (37.1 C) (Oral)   Resp 18   Wt 130 lb (59 kg)   LMP  (LMP Unknown)   SpO2 96%   BMI 20.36 kg/m   Physical Exam Vitals and nursing note reviewed.  Constitutional:      General: She is not in acute distress.    Appearance: Normal appearance. She is not ill-appearing.  HENT:     Mouth/Throat:     Mouth: Mucous membranes are moist.      Pharynx: Oropharynx is clear.  Cardiovascular:     Rate and Rhythm: Normal rate and regular rhythm.  Pulmonary:     Effort: Pulmonary effort is normal. No respiratory distress.     Breath sounds: Normal breath sounds.  Abdominal:     Tenderness: There is abdominal tenderness. There is guarding.  Neurological:     General: No focal deficit present.     Mental Status: She is alert and oriented to person, place, and time. Mental status is at baseline.  Psychiatric:        Mood and Affect: Mood normal.        Behavior: Behavior normal.        Thought Content: Thought content normal.        Judgment: Judgment normal.      MD Evaluation Airway: WNL Heart: WNL Abdomen: WNL Chest/ Lungs: WNL ASA  Classification: 3 Mallampati/Airway Score: Two   Imaging: CT ABDOMEN PELVIS WO CONTRAST  Result Date: 07/31/2023 CLINICAL DATA:  Left lower quadrant pain.  Left flank pain. EXAM: CT ABDOMEN AND PELVIS WITHOUT CONTRAST TECHNIQUE: Multidetector CT imaging of the abdomen and pelvis was performed following the standard protocol without IV contrast. RADIATION DOSE REDUCTION: This exam was performed according to the departmental dose-optimization program which includes automated exposure control, adjustment of the mA and/or kV according to patient size and/or use of iterative reconstruction technique. COMPARISON:  01/27/2023 FINDINGS: Lower chest: No acute abnormality. Hepatobiliary: No focal liver abnormality is seen. No gallstones, gallbladder wall thickening, or biliary dilatation. Pancreas: Unremarkable. No pancreatic ductal dilatation or surrounding inflammatory changes. Spleen: Normal in size without focal abnormality. Adrenals/Urinary Tract: Adrenal glands are unremarkable. No obstructive uropathy. Nonobstructing right renal calculus. Stomach/Bowel: Small hiatal hernia. Stomach is within normal limits. Prior fundoplication. Thickening of the distal descending colon-sigmoid junction at the site of  previously described diverticular disease. Pericolonic 2.6 x 1.8 x 3.4 cm fluid collection along the anti mesenteric aspect of the distal descending colon most concerning for an abscess. Small bubbles of extraluminal air adjacent to the fluid collection concerning for small volume pneumoperitoneum from bowel perforation. Rectal fecal impaction with a dilated rectum measuring up to 9 cm. Vascular/Lymphatic: Aortic atherosclerosis. No enlarged abdominal or pelvic lymph nodes. Reproductive: Uterus and bilateral adnexa are unremarkable. 2.5 cm right ovarian cyst unchanged from the prior exam. Other: No abdominal wall hernia or abnormality. No abdominopelvic ascites. Musculoskeletal: No acute osseous abnormality. No aggressive osseous lesion. Degenerative disease with disc height loss throughout the thoracolumbar spine. IMPRESSION: 1. Thickening of the distal descending colon-sigmoid junction most consistent with perforated diverticulitis. Pericolonic 2.6 x 1.8 x 3.4 cm fluid collection along the anti mesenteric aspect of the distal descending colon most concerning for an abscess. Small bubbles of extraluminal air adjacent to the fluid collection concerning for small volume pneumoperitoneum from bowel perforation. 2. Rectal fecal impaction with a dilated rectum measuring up to 8.6 cm. 3.  Aortic Atherosclerosis (ICD10-I70.0). Critical Value/emergent results were called by telephone at the time of interpretation on 07/31/2023 at 2:06 pm to provider [facility] , who verbally acknowledged these results. Electronically Signed   By: Elige Ko M.D.   On: 07/31/2023 14:09    Labs:  CBC: Recent Labs    12/22/22 1600 03/04/23 1027 04/01/23 1021 05/27/23 1027 06/01/23 1459 07/07/23 1247 07/31/23 1044  WBC 6.8 6.5  --   --   --  7.9 12.9*  HGB 8.6* 9.6*   < > 9.8* 9.7* 9.3* 10.8*  HCT 27.3* 31.3*   < > 31.8* 30.7* 31.1* 35.0*  PLT 284 245  --   --   --  267 317   < > = values in this interval not displayed.     COAGS: No results for  input(s): "INR", "APTT" in the last 8760 hours.  BMP: Recent Labs    08/21/22 1151 08/26/22 1613 08/27/22 0442 12/22/22 1600 01/27/23 0742 07/31/23 1044  NA 143  --  144 143  --  139  K 4.3  --  4.5 5.0  --  4.4  CL 110  --  118* 110*  --  107  CO2 25  --  21* 21  --  22  GLUCOSE 108*  --  77 217*  --  279*  BUN 54*  --  61* 35*  --  59*  CALCIUM 9.1  --  8.2* 8.9  --  8.9  CREATININE 2.38* 2.53* 2.65* 2.17* 2.80* 2.28*  GFRNONAA 21* 19* 18*  --   --  22*    LIVER FUNCTION TESTS: Recent Labs    12/22/22 1600 07/31/23 1044  BILITOT <0.2 0.4  AST 14 18  ALT 7 14  ALKPHOS 62 64  PROT 5.5* 6.7  ALBUMIN 3.5* 3.4*    TUMOR MARKERS: No results for input(s): "AFPTM", "CEA", "CA199", "CHROMGRNA" in the last 8760 hours.  Assessment and Plan: Intra-abdominal fluid collection Patient with history of diverticulitis presents with acute abdominal pain.  CT imaging shows abscess.  IR consulted for aspiration and drainage at the request of Dr. Claudine Mouton.  Case reviewed and approved by Dr. Juliette Alcide.  She has been NPO today.  She does take aspirin but has not taken in several weeks.  Discussed with patient, husband, and daughter by phone.  Allquestions answered.  They are agreeable to proceed.   Advance Care Plan: The advanced care plan/surrogate decision maker was discussed at the time of visit and the patient did not wish to discuss or was not able to name a surrogate decision maker or provide an advance care plan.   Risks and benefits discussed with the patient including bleeding, infection, damage to adjacent structures, bowel perforation/fistula connection, and sepsis.  All of the patient's questions were answered, patient is agreeable to proceed. Consent signed and in chart.  Thank you for this interesting consult.  I greatly enjoyed meeting TIMOTEA ROUGHTON and look forward to participating in their care.  A copy of this report was sent to the  requesting provider on this date.  Electronically Signed: Hoyt Koch, PA 07/31/2023, 3:32 PM   I spent a total of 40 Minutes    in face to face in clinical consultation, greater than 50% of which was counseling/coordinating care for intra-abdominal abscess.

## 2023-07-31 NOTE — Assessment & Plan Note (Signed)
Hgb 11  Baseline hgb 10-11 Monitor

## 2023-07-31 NOTE — Assessment & Plan Note (Addendum)
Acute severe lower abdominal pain with noted Pericolonic 2.6 x 1.8 x 3.4 cm fluid collection along the anti mesenteric aspect of the distal descending colon most concerning for an abscess on imaging Pending IR drainage per surgery recommendations Management as per primary team general surgery

## 2023-07-31 NOTE — ED Provider Notes (Signed)
[facility] Provider Note    Event Date/Time   First MD Initiated Contact with Patient 07/31/23 1124     (approximate)   History   Chief Complaint Flank Pain   HPI  Tonya [name] is a 77 y.o. female with past medical history of hypertension, hyperlipidemia, diabetes, stroke, CHF, CKD, and chronic pain syndrome who presents to the ED complaining of abdominal pain.  Patient reports that she initially developed pain in the left lower quadrant of her abdomen yesterday evening, has been gradually worsening since then.  Pain is now constant and severe, exacerbated by any movement.  She has been feeling nauseous and reports vomiting up clear fluid, denies any diarrhea or blood in her stool.  She denies any history of similar symptoms, currently denies any pain in her flank area.  She has not had any fevers, dysuria, or hematuria.     Physical Exam   Triage Vital Signs: ED Triage Vitals  Encounter Vitals Group     BP 07/31/23 1044 (!) 157/90     Systolic BP Percentile --      Diastolic BP Percentile --      Pulse Rate 07/31/23 1044 91     Resp 07/31/23 1044 19     Temp 07/31/23 1044 99.4 F (37.4 C)     Temp src --      SpO2 07/31/23 1044 100 %     Weight --      Height --      Head Circumference --      Peak Flow --      Pain Score 07/31/23 1043 10     Pain Loc --      Pain Education --      Exclude from Growth Chart --     Most recent vital signs: Vitals:   07/31/23 1044  BP: (!) 157/90  Pulse: 91  Resp: 19  Temp: 99.4 F (37.4 C)  SpO2: 100%    Constitutional: Alert and oriented. Eyes: Conjunctivae are normal. Head: Atraumatic. Nose: No congestion/rhinnorhea. Mouth/Throat: Mucous membranes are moist.  Cardiovascular: Normal rate, regular rhythm. Grossly normal heart sounds.  2+ radial pulses bilaterally. Respiratory: Normal respiratory effort.  No retractions. Lungs CTAB. Gastrointestinal: Soft and tender to palpation in the left  lower quadrant with voluntary guarding.  No CVA tenderness bilaterally. No distention. Musculoskeletal: No lower extremity tenderness nor edema.  Neurologic:  Normal speech and language. No gross focal neurologic deficits are appreciated.    ED Results / Procedures / Treatments   Labs (all labs ordered are listed, but only abnormal results are displayed) Labs Reviewed  URINALYSIS, ROUTINE W REFLEX MICROSCOPIC - Abnormal; Notable for the following components:      Result Value   Color, Urine YELLOW (*)    APPearance CLEAR (*)    Glucose, UA 50 (*)    Hgb urine dipstick SMALL (*)    Protein, ur 100 (*)    Bacteria, UA FEW (*)    All other components within normal limits  CBC - Abnormal; Notable for the following components:   WBC 12.9 (*)    Hemoglobin 10.8 (*)    HCT 35.0 (*)    All other components within normal limits  BASIC METABOLIC PANEL - Abnormal; Notable for the following components:   Glucose, Bld 279 (*)    BUN 59 (*)    Creatinine, Ser 2.28 (*)    GFR, Estimated 22 (*)    All other components  within normal limits  HEPATIC FUNCTION PANEL - Abnormal; Notable for the following components:   Albumin 3.4 (*)    All other components within normal limits  LIPASE, BLOOD   RADIOLOGY CT abdomen/pelvis reviewed and interpreted by me with inflammatory changes in the left lower quadrant consistent with diverticulitis, small amount of free air noted.  PROCEDURES:  Critical Care performed: Yes, see critical care procedure note(s)  .Critical Care  Performed by: Chesley Noon, MD Authorized by: Chesley Noon, MD   Critical care provider statement:    Critical care time (minutes):  30   Critical care time was exclusive of:  Separately billable procedures and treating other patients and teaching time   Critical care was necessary to treat or prevent imminent or life-threatening deterioration of the following conditions: Bowel perforation.   Critical care was time spent  personally by me on the following activities:  Development of treatment plan with patient or surrogate, discussions with consultants, evaluation of patient's response to treatment, examination of patient, ordering and review of laboratory studies, ordering and review of radiographic studies, ordering and performing treatments and interventions, pulse oximetry, re-evaluation of patient's condition and review of old charts   I assumed direction of critical care for this patient from another provider in my specialty: no     Care discussed with: admitting provider      MEDICATIONS ORDERED IN ED: Medications  piperacillin-tazobactam (ZOSYN) IVPB 3.375 g (has no administration in time range)  HYDROmorphone (DILAUDID) injection 0.5 mg (has no administration in time range)  morphine (PF) 4 MG/ML injection 4 mg (4 mg Intravenous Given 07/31/23 1212)  ondansetron (ZOFRAN) injection 4 mg (4 mg Intravenous Given 07/31/23 1211)  lactated ringers bolus 1,000 mL (0 mLs Intravenous Stopped 07/31/23 1433)     IMPRESSION / MDM / ASSESSMENT AND PLAN / ED COURSE  I reviewed the triage vital signs and the nursing notes.                              77 y.o. female with past medical history of hypertension, hyperlipidemia, diabetes, CHF, stroke, CKD, and chronic pain syndrome who presents to the ED complaining of increasing pain to left lower quadrant of her abdomen since last night.  Patient's presentation is most consistent with acute presentation with potential threat to life or bodily function.  Differential diagnosis includes, but is not limited to, diverticulitis, perforated viscus, kidney stone, UTI, colitis, bowel obstruction.  Patient uncomfortable appearing but nontoxic and in no acute distress, vital signs are unremarkable.  Her abdomen is soft but she does have exquisite tenderness in the left lower quadrant with voluntary guarding.  Labs thus far show mild leukocytosis with no significant anemia,  stable CKD noted with no acute electrolyte abnormality.  We will add on LFTs and lipase, urinalysis pending at this time.  Will treat symptomatically with IV morphine and Zofran, hydrate with IV fluids and reassess following imaging.  Urinalysis shows no signs of infection, CT imaging shows evidence of diverticulitis with abscess and small amount of free air indicating perforation.  We will start patient on IV Zosyn, case discussed with Dr. Claudine Mouton of general surgery, who will admit the patient.      FINAL CLINICAL IMPRESSION(S) / ED DIAGNOSES   Final diagnoses:  Diverticulitis  Bowel perforation (HCC)     Rx / DC Orders   ED Discharge Orders     None  Note:  This document was prepared using Dragon voice recognition software and may include unintentional dictation errors.   Chesley Noon, MD 07/31/23 1444

## 2023-07-31 NOTE — Assessment & Plan Note (Signed)
Creatinine 2.2 today with baseline creatinine around 2.2-2.8. At baseline Monitor Minimize nephrotoxic agents

## 2023-07-31 NOTE — Assessment & Plan Note (Signed)
BP stable Titrate home regimen 

## 2023-07-31 NOTE — Assessment & Plan Note (Addendum)
2D echo December 2020 with EF of 60 to 65% and grade 1 diastolic dysfunction Appears mildly dry to euvolemic on exam Monitor volume status in setting of perforated diverticulitis Continue home regimen Follow

## 2023-07-31 NOTE — H&P (Signed)
[facility] SURGICAL HISTORY & PHYSICAL (cpt [phone])  HISTORY OF PRESENT ILLNESS (HPI):  77 y.o. female presented to [facility] ED today for abdominal pain. Patient reports around a 1-week history of mild lower abdominal pain. However, in the last 24 hours, this acutely worsened, became severe, and localized to her LLQ. This has been sharp in nature. Nothing seemed to make it better. Movement or coughing exacerbates the pain. She reports accompanying nausea, emesis. No fever, chills, cough, CP, SOB, or urinary changes. She does report regular bowel movements. She does have a history of diverticulitis but this is more severe than previous episodes. She does have history of recurrent paraesophageal hernia s/p initial repair in September 2023 with Dr Everlene Farrier. She was afebrile in the Ed with T-max 99.11F, borderline tachycardia to 102 bpm, and mildly hypertensive at 146/79. She was found to have leukocytosis to 12.9K, Hgb to 10.8, sCr at baseline at 2.28. She did undergo CT Abdomen/Pelvis which was concerning for perforated diverticulitis with scattered pneumoperitoneum and abscess. She was given Zosyn. She is NPO.   General Surgery is consulted by emergency medicine physician Dr Chesley Noon, MD for evaluation and management of perforated diverticulitis with abscess.    PAST MEDICAL HISTORY (PMH):  Past Medical History:  Diagnosis Date   (HFpEF) heart failure with preserved ejection fraction (HCC)    a.) TTE 05/29/2017: EF 55-60%, mild LAE, G1DD; b.) TTE 09/07/2017: EF >55%, mild LAE, Ao sclerosis without stenosis, degen MV disease, G2DD; c.) TTE 12/02/2018: EF 55-60%, mild LVH, Mild BAE, triv TR, G1DD; d.) TTE 11/22/2019: EF 60-65%, mod LVH, mild TR, G1DD   Anemia in stage 4 chronic kidney disease (HCC)    Arthritis    Cardiomyopathy (HCC)    CKD (chronic kidney disease) stage 4, GFR 15-29 ml/min (HCC)    CVA (cerebral vascular accident) (HCC) 08/2017   a.) no residual deficits   Diabetic  neuropathy (HCC)    Diverticulitis    GERD (gastroesophageal reflux disease)    History of hiatal hernia    Hyperlipidemia    Hypertension    Long term current use of antithrombotics/antiplatelets    a.) DAPT therapy (ASA + clopidogrel)   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 Otherwise negative.   PAST SURGICAL HISTORY [facility]):  Past Surgical History:  Procedure Laterality Date   CATARACT EXTRACTION W/PHACO Left 02/05/2021   Procedure: CATARACT EXTRACTION PHACO AND INTRAOCULAR LENS PLACEMENT (IOC) LEFT DIABETIC 5.47 00:51.4;  Surgeon: Galen Manila, MD;  Location: [facility];  Service: Ophthalmology;  Laterality: Left;  Diabetic - insulin and oral meds   CATARACT EXTRACTION W/PHACO Right 03/19/2021   Procedure: CATARACT EXTRACTION PHACO AND INTRAOCULAR LENS PLACEMENT (IOC) RIGHT DIABETIC 6.09 00:39.6;  Surgeon: Galen Manila, MD;  Location: [facility];  Service: Ophthalmology;  Laterality: Right;   COLONOSCOPY  12/15/2006   COLONOSCOPY WITH PROPOFOL N/A 06/06/2019   Procedure: COLONOSCOPY WITH PROPOFOL;  Surgeon: Wyline Mood, MD;  Location: [facility] ENDOSCOPY;  Service: Gastroenterology;  Laterality: N/A;   COLONOSCOPY WITH PROPOFOL N/A 01/16/2020   Procedure: COLONOSCOPY WITH BIOPSY;  Surgeon: Wyline Mood, MD;  Location: [facility] SUR[facility]ERY [facility];  Service: Endoscopy;  Laterality: N/A;  Diabetic - insulin and oral meds   ESOPHAGOGASTRODUODENOSCOPY (EGD)  WITH PROPOFOL N/A 06/23/2022   Procedure: ESOPHAGOGASTRODUODENOSCOPY (EGD) WITH PROPOFOL;  Surgeon: Wyline Mood, MD;  Location: [facility] ENDOSCOPY;  Service: Gastroenterology;  Laterality: N/A;   EYE SURGERY     INSERTION OF MESH  08/26/2022   Procedure: INSERTION OF MESH;  Surgeon: Leafy Ro, MD;   Location: [facility] ORS;  Service: General;;   POLYPECTOMY N/A 01/16/2020   Procedure: POLYPECTOMY;  Surgeon: Wyline Mood, MD;  Location: [facility] SURGERY [facility]TR;  Service: Endoscopy;  Laterality: N/A;   TUBAL LIGATION     XI ROBOTIC ASSISTED PARAESOPHAGEAL HERNIA REPAIR N/A 08/26/2022   Procedure: XI ROBOTIC ASSISTED PARAESOPHAGEAL HERNIA REPAIR, RNFA to assist;  Surgeon: Leafy Ro, MD;  Location: [facility] ORS;  Service: General;  Laterality: N/A;    Reviewed. Otherwise negative.   MEDICATIONS:  Prior to Admission medications   Medication Sig Start Date End Date Taking? Authorizing Provider  acetaminophen (TYLENOL) 650 MG CR tablet Take 650 mg by mouth every 8 (eight) hours as needed for pain.    [provider]  aspirin 81 MG tablet Take 81 mg by mouth daily.    [provider]  carvedilol (COREG) 3.125 MG tablet Take 1 tablet (3.125 mg total) by mouth 2 (two) times daily with a meal. PLEASE CALL OFFICE TO SCHEDULE APPOINTMENT PRIOR TO NEXT REFILL 06/15/23   Antonieta Iba, MD  diclofenac Sodium (VOLTAREN) 1 % GEL Apply 4 g topically 4 (four) times daily. 07/06/23   Pearley, Sherran Needs, NP  docusate sodium (COLACE) 100 MG capsule Take 100 mg by mouth daily as needed for mild constipation.    [provider]  gabapentin (NEURONTIN) 100 MG capsule Take 1 capsule (100 mg total) by mouth at bedtime. 02/25/23   Cannady, Corrie Dandy T, NP  insulin lispro (HUMALOG) 100 UNIT/ML injection Inject 8 Units into the skin 3 (three) times daily before meals.    [provider]  meclizine (ANTIVERT) 12.5 MG tablet Take 12.5 mg by mouth 3 (three) times daily. 12/22/22   [provider]  methocarbamol (ROBAXIN) 500 MG tablet Take 1 tablet (500 mg total) by mouth daily as needed for muscle spasms. 10/23/22   Edward Jolly, MD  naloxone [facility]) nasal spray 4 mg/0.1 mL SMARTSIG:Both Nares Patient not taking: Reported on 07/07/2023 08/27/22   [provider]  NOVOFINE 32G X  6 MM MISC USE AS DIRECTED THREE TIMES DAILY WITH HUMALOG 06/19/20   Cannady, [name] T, NP  olmesartan (BENICAR) 40 MG tablet Take 1 tablet (40 mg total) by mouth daily. Patient taking differently: Take 40 mg by mouth every morning. 08/07/20   Cannady, Corrie Dandy T, NP  pantoprazole (PROTONIX) 40 MG tablet Take 1 tablet (40 mg total) by mouth daily. 01/28/23   Pabon, Diego F, MD  pioglitazone (ACTOS) 30 MG tablet Take 30 mg by mouth daily.    [provider]  rosuvastatin (CRESTOR) 5 MG tablet Take 1 tablet (5 mg total) by mouth daily. PLEASE CALL OFFICE TO SCHEDULE APPOINTMENT PRIOR TO NEXT REFILL 06/15/23   Antonieta Iba, MD  torsemide (DEMADEX) 20 MG tablet Take 2 tablets (40 mg total) by mouth daily. Patient taking differently: Take 40 mg by mouth every morning. 07/01/22   Antonieta Iba, MD  vitamin B-12 (CYANOCOBALAMIN) 100 MCG tablet Take 100 mcg by mouth daily.    [provider]  Vitamin D, Cholecalciferol, 50 MCG (2000 UT) CAPS Take 2,000 Units by mouth daily.    [provider]  ALLERGIES:  Allergies  Allergen Reactions   Atorvastatin     Myalgias    Gabapentin Other (See Comments)    Speech impairment    Pravastatin     Myalgias    Pregabalin     Speech impairment   Trulicity [Dulaglutide] Hives     SOCIAL HISTORY:  Social History   Socioeconomic History   Marital status: Married    Spouse name: Not on file   Number of children: Not on file   Years of education: Not on file   Highest education level: GED or equivalent  Occupational History   Occupation: retired  Tobacco Use   Smoking status: Never    Passive exposure: Never   Smokeless tobacco: Never  Vaping Use   Vaping status: Never Used  Substance and Sexual Activity   Alcohol use: No    Alcohol/week: 0.0 standard drinks of alcohol   Drug use: No   Sexual activity: Yes  Other Topics Concern   Not on file  Social History Narrative   Not on file   Social Determinants of  Health   Financial Resource Strain: High Risk (04/13/2023)   Overall Financial Resource Strain (CARDIA)    Difficulty of Paying Living Expenses: Hard  Food Insecurity: No Food Insecurity (02/17/2023)   Hunger Vital Sign    Worried About Running Out of Food in the Last Year: Never true    Ran Out of Food in the Last Year: Never true  Transportation Needs: No Transportation Needs (04/13/2023)   PRAPARE - Administrator, Civil Service (Medical): No    Lack of Transportation (Non-Medical): No  Physical Activity: Inactive (02/17/2023)   Exercise Vital Sign    Days of Exercise per Week: 0 days    Minutes of Exercise per Session: 0 min  Stress: No Stress Concern Present (02/17/2023)   [employer] of Occupational Health - Occupational Stress Questionnaire    Feeling of Stress : Not at all  Social Connections: Moderately Isolated (02/17/2023)   Social Connection and Isolation Panel [NHANES]    Frequency of Communication with Friends and Family: More than three times a week    Frequency of Social Gatherings with Friends and Family: Three times a week    Attends Religious Services: Never    Active Member of Clubs or Organizations: No    Attends Banker Meetings: Never    Marital Status: Married  Catering manager Violence: Not At Risk (02/17/2023)   Humiliation, Afraid, Rape, and Kick questionnaire    Fear of Current or Ex-Partner: No    Emotionally Abused: No    Physically Abused: No    Sexually Abused: No     FAMILY HISTORY:  Family History  Problem Relation Age of Onset   Diabetes Mother    Stroke Mother    Hyperlipidemia Father    Hypertension Father    Diabetes Sister    Diabetes Brother    Gout Son    Diabetes Brother    Kidney disease Son    Lymphoma Sister    Heart attack Sister    Breast cancer Neg Hx     Otherwise negative.   REVIEW OF SYSTEMS:  Review of Systems  Constitutional:  Negative for chills and fever.  Respiratory:  Negative for cough  and shortness of breath.   Cardiovascular:  Negative for chest pain and palpitations.  Gastrointestinal:  Positive for abdominal pain, nausea and vomiting. Negative for constipation and diarrhea.  Genitourinary:  Negative  for dysuria and urgency.  All other systems reviewed and are negative.   VITAL SIGNS:  Temp:  [99.4 F (37.4 C)] 99.4 F (37.4 C) (08/16 1044) Pulse Rate:  [91] 91 (08/16 1044) Resp:  [19] 19 (08/16 1044) BP: (157)/(90) 157/90 (08/16 1044) SpO2:  [100 %] 100 % (08/16 1044)             PHYSICAL EXAM:  Physical Exam Vitals and nursing note reviewed. Exam conducted with a chaperone present.  Constitutional:      General: She is not in acute distress.    Appearance: She is normal weight. She is ill-appearing.     Comments: Patient resting in bed; appears uncomfortable; husband at bedside   HENT:     Head: Normocephalic and atraumatic.  Eyes:     General: No scleral icterus.    Conjunctiva/sclera: Conjunctivae normal.  Cardiovascular:     Rate and Rhythm: Tachycardia present.     Pulses: Normal pulses.     Comments: Borderline tachycardia; HR-max 102 Pulmonary:     Effort: Pulmonary effort is normal. No respiratory distress.  Abdominal:     General: There is no distension.     Palpations: Abdomen is soft.     Tenderness: There is abdominal tenderness in the periumbilical area, suprapubic area and left lower quadrant. There is guarding. There is no rebound.     Comments: Abdomen is soft, she is quite tender in the LLQ with very localized tenderness, she does not appear distended. She certainly has localized peritonitis to the LLQ but does not appear to be diffusely peritonitic  Genitourinary:    Comments: Deferred Musculoskeletal:     Right lower leg: No edema.     Left lower leg: No edema.  Skin:    General: Skin is warm and dry.     Findings: No erythema.  Neurological:     General: No focal deficit present.     Mental Status: She is alert and oriented  to person, place, and time.  Psychiatric:        Mood and Affect: Mood normal.        Behavior: Behavior normal.     INTAKE/OUTPUT:  This shift: No intake/output data recorded.  Last 2 shifts: @IOLAST2SHIFTS @  Labs:     Latest Ref Rng & Units 07/31/2023   10:44 AM 07/07/2023   12:47 PM 06/01/2023    2:59 PM  CBC  WBC 4.0 - 10.5 K/uL 12.9  7.9    Hemoglobin 12.0 - 15.0 g/dL 40.9  9.3  9.7   Hematocrit 36.0 - 46.0 % 35.0  31.1  30.7   Platelets 150 - 400 K/uL 317  267        Latest Ref Rng & Units 07/31/2023   10:44 AM 01/27/2023    7:42 AM 12/22/2022    4:00 PM  CMP  Glucose 70 - 99 mg/dL 811   914   BUN 8 - 23 mg/dL 59   35   Creatinine 7.82 - 1.00 mg/dL 9.56  2.13  0.86   Sodium 135 - 145 mmol/L 139   143   Potassium 3.5 - 5.1 mmol/L 4.4   5.0   Chloride 98 - 111 mmol/L 107   110   CO2 22 - 32 mmol/L 22   21   Calcium 8.9 - 10.3 mg/dL 8.9   8.9   Total Protein 6.5 - 8.1 g/dL 6.7   5.5   Total Bilirubin 0.3 - 1.2 mg/dL 0.4   <  0.2   Alkaline Phos 38 - 126 U/L 64   62   AST 15 - 41 U/L 18   14   ALT 0 - 44 U/L 14   7      Imaging studies:   CT Abdomen/Pelvis (07/31/2023) personally reviewed showing scattered airs of pneumoperitoneum, abscess adjacent to sigmoid colon, consistent with perforated diverticulitis, and radiologist report reviewed below:  IMPRESSION: 1. Thickening of the distal descending colon-sigmoid junction most consistent with perforated diverticulitis. Pericolonic 2.6 x 1.8 x 3.4 cm fluid collection along the anti mesenteric aspect of the distal descending colon most concerning for an abscess. Small bubbles of extraluminal air adjacent to the fluid collection concerning for small volume pneumoperitoneum from bowel perforation. 2. Rectal fecal impaction with a dilated rectum measuring up to 8.6 cm. 3.  Aortic Atherosclerosis (ICD10-I70.0).     Assessment/Plan: (ICD-10's: K88.92) 77 y.o. female with LLQ abdominal pain, leukocytosis found to have  perforated diverticulitis with abscess and scattered pneumoperitoneum.   - Lengthy discussion had with patient and her family regarding her disease process and potential treatment options including surgery vs attempting percutaneous drainage. They understand that surgery would ultimately result in temporizing colostomy but would be more definitive treatment option. Percutaneous drainage could also be considered in this setting understanding that this may not be sufficient and she would ultimately require surgery this admission. They are also aware that if she were to continue to clinically deteriorate in any fashion, we would need to pursue surgery urgently. All questions and concerns regarding treatment options were addressed and answered. After discussion, patient, and her family (husband at bedside, daughter on the phone) wish to pursue drain placement and conservative measures first and make efforts to avoid surgery as long as feasible. Again, they understand she still may fail and require intervention. We were able to discuss with IR regarding this, and they are agreeable with placement as long as timing and schedule allow. I do feel that she needs drain placement as urgently as possible. Greatly appreciate their efforts.   For now  - Will admit to general surgery; Appreciate hospitalist as consult to manage comorbidities  - NPO + IVF Resuscitation  - IV Abx (Zosyn)  - Monitor abdominal examination  - Pain control prn; antiemetics prn  - Mobilize as tolerated  All of the above findings and recommendations were discussed with the patient and her family (husband at bedside, daughter on the phone), and all of their questions were answered to their expressed satisfaction.  -- Lynden Oxford, PA-C [facility] 07/31/2023, 3:07 PM M-F: 7am - 4pm

## 2023-07-31 NOTE — Telephone Encounter (Signed)
Discontinued on 12/22/22 by provider, will refuse due to this.  Requested Prescriptions  Pending Prescriptions Disp Refills   clopidogrel (PLAVIX) 75 MG tablet [Pharmacy Med Name: Clopidogrel Bisulfate 75 MG Oral Tablet] 100 tablet 2    Sig: TAKE 1 TABLET BY MOUTH DAILY     Hematology: Antiplatelets - clopidogrel Failed - 07/30/2023 10:25 PM      Failed - HCT in normal range and within 180 days    HCT  Date Value Ref Range Status  07/31/2023 35.0 (L) 36.0 - 46.0 % Final   Hematocrit  Date Value Ref Range Status  12/22/2022 27.3 (L) 34.0 - 46.6 % Final         Failed - HGB in normal range and within 180 days    Hemoglobin  Date Value Ref Range Status  07/31/2023 10.8 (L) 12.0 - 15.0 g/dL Final  06/13/1600 9.3 (L) 12.0 - 15.0 g/dL Final  09/32/3557 8.6 (L) 11.1 - 15.9 g/dL Final         Failed - Cr in normal range and within 360 days    Creatinine, Ser  Date Value Ref Range Status  07/31/2023 2.28 (H) 0.44 - 1.00 mg/dL Final         Passed - PLT in normal range and within 180 days    Platelets  Date Value Ref Range Status  07/31/2023 317 150 - 400 K/uL Final  12/22/2022 284 150 - 450 x10E3/uL Final   Platelet Count  Date Value Ref Range Status  07/07/2023 267 150 - 400 K/uL Final         Passed - Valid encounter within last 6 months    Recent Outpatient Visits           3 weeks ago Acute pain of right shoulder   [place] [facility] [place], Sherran Needs, NP   1 month ago Poorly controlled type 2 diabetes mellitus with neuropathy (HCC)   [place] [facility] [place], Dorie Rank, NP   5 months ago Post herpetic neuralgia   [place] [facility] [place], Bowie T, NP   5 months ago Post herpetic neuralgia   [place] [facility] [place], [place] T, NP   6 months ago 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 [facility] [place], Dorie Rank, NP       Future  Appointments             In 4 months Cannady, Dorie Rank, NP [place] [facility]

## 2023-07-31 NOTE — ED Triage Notes (Signed)
Pt comes with left flank pain . Pt states this all started last night. Pt states pain is worse now. Pt states nausea and vomiting.  Pt denies any pain or burning with urination

## 2023-08-01 DIAGNOSIS — K5720 Diverticulitis of large intestine with perforation and abscess without bleeding: Secondary | ICD-10-CM | POA: Diagnosis not present

## 2023-08-01 LAB — BASIC METABOLIC PANEL
Anion gap: 8 (ref 5–15)
BUN: 53 mg/dL — ABNORMAL HIGH (ref 8–23)
CO2: 21 mmol/L — ABNORMAL LOW (ref 22–32)
Calcium: 8 mg/dL — ABNORMAL LOW (ref 8.9–10.3)
Chloride: 112 mmol/L — ABNORMAL HIGH (ref 98–111)
Creatinine, Ser: 2.2 mg/dL — ABNORMAL HIGH (ref 0.44–1.00)
GFR, Estimated: 23 mL/min — ABNORMAL LOW (ref >60)
Glucose, Bld: 150 mg/dL — ABNORMAL HIGH (ref 70–99)
Potassium: 4.3 mmol/L (ref 3.5–5.1)
Sodium: 141 mmol/L (ref 135–145)

## 2023-08-01 LAB — CBC
HCT: 30.9 % — ABNORMAL LOW (ref 36.0–46.0)
Hemoglobin: 9.6 g/dL — ABNORMAL LOW (ref 12.0–15.0)
MCH: 27.4 pg (ref 26.0–34.0)
MCHC: 31.1 g/dL (ref 30.0–36.0)
MCV: 88.3 fL (ref 80.0–100.0)
Platelets: 232 10*3/uL (ref 150–400)
RBC: 3.5 MIL/uL — ABNORMAL LOW (ref 3.87–5.11)
RDW: 15.5 % (ref 11.5–15.5)
WBC: 9.7 10*3/uL (ref 4.0–10.5)
nRBC: 0 % (ref 0.0–0.2)

## 2023-08-01 LAB — HEMOGLOBIN A1C
Hgb A1c MFr Bld: 8.8 % — ABNORMAL HIGH (ref 4.8–5.6)
Mean Plasma Glucose: 205.86 mg/dL

## 2023-08-01 LAB — GLUCOSE, CAPILLARY
Glucose-Capillary: 101 mg/dL — ABNORMAL HIGH (ref 70–99)
Glucose-Capillary: 131 mg/dL — ABNORMAL HIGH (ref 70–99)
Glucose-Capillary: 145 mg/dL — ABNORMAL HIGH (ref 70–99)
Glucose-Capillary: 170 mg/dL — ABNORMAL HIGH (ref 70–99)
Glucose-Capillary: 96 mg/dL (ref 70–99)
Glucose-Capillary: 99 mg/dL (ref 70–99)

## 2023-08-01 MED ORDER — BISACODYL 10 MG RE SUPP
10.0000 mg | Freq: Every day | RECTAL | Status: DC
  Administered 2023-08-01 – 2023-08-04 (×3): 10 mg via RECTAL
  Filled 2023-08-01 (×6): qty 1

## 2023-08-01 NOTE — Plan of Care (Signed)

## 2023-08-01 NOTE — Progress Notes (Signed)
  Hospitalist Consult Daily PROGRESS NOTE    MARGERET [name]  WGN:[id number] DOB: January 19, 1946 DOA: 07/31/2023 PCP: Marjie Skiff, NP  215A/215A-AA  LOS: 1 day   Brief hospital course:   Assessment & Plan: Tonya [name] is a 77 y.o. female with past medical history of HFpEF, stage IV CKD, anemia of chronic disease, history of CVA with without deficits, GERD, hypertension, hyperlipidemia presenting with perforated diverticulitis with abscess.  Hospitalist service consult requested for medical co-management.   * Diverticulitis of colon with perforation S/p IR drain placement on 8/16 Acute severe lower abdominal pain with noted Pericolonic 2.6 x 1.8 x 3.4 cm fluid collection along the anti mesenteric aspect of the distal descending colon most concerning for an abscess on imaging --cont zosyn --cont drain management --cont clear liquid diet, per surgery  Chronic heart failure with preserved ejection fraction (HFpEF) (HCC) 2D echo December 2020 with EF of 60 to 65% and grade 1 diastolic dysfunction Appears mildly dry to euvolemic on exam --reduce MIVF to 50 ml/hr --hold home torsemide --cont coreg and irbesartan  Anemia of chronic disease Hgb 11  Baseline hgb 10-11 Monitor    History of CVA (cerebrovascular accident) Remote history of CVA in December 2020 --cont statin  CKD (chronic kidney disease) stage 4, GFR 15-29 ml/min (HCC) Creatinine 2.2 today with baseline creatinine around 2.2-2.8. At baseline  Hyperlipidemia associated with type 2 diabetes mellitus (HCC) Cont statin   HTN --cont coreg, irbesartan   DVT prophylaxis: Heparin SQ Code Status: Full code  Family Communication:  Level of care: Med-Surg Dispo:   The patient is from: home Anticipated d/c is to: home Anticipated d/c date is: >3 days   Subjective and Interval History:  Pt reported pain controlled.  Tolerating clear liquid.  No BM yet.   Objective: Vitals:   07/31/23 1810 07/31/23 2002  08/01/23 0420 08/01/23 0832  BP: (!) 162/87 (!) 149/72 135/62 (!) 143/67  Pulse: (!) 101 (!) 102 86 78  Resp: 18 20 16 16   Temp: 98.8 F (37.1 C) 98.8 F (37.1 C) 98.8 F (37.1 C) 98 F (36.7 C)  TempSrc: Oral Oral Oral   SpO2: 100% 99% 95% 100%  Weight:        Intake/Output Summary (Last 24 hours) at 08/01/2023 1627 Last data filed at 08/01/2023 1442 Gross per 24 hour  Intake 665.72 ml  Output 5 ml  Net 660.72 ml   Filed Weights   07/31/23 1532  Weight: 59 kg    Examination:   Constitutional: NAD, AAOx3 HEENT: conjunctivae and lids normal, EOMI CV: No cyanosis.   RESP: normal respiratory effort, on RA Abdomen: drain on the left side Psych: depressed mood and affect.  Appropriate judgement and reason   Data Reviewed: I have personally reviewed labs and imaging studies  Time spent: 35 minutes  Darlin Priestly, MD [facility] If 7PM-7AM, please contact night-coverage 08/01/2023, 4:27 PM

## 2023-08-01 NOTE — Progress Notes (Signed)
[facility] SURGICAL PROGRESS NOTE (cpt [phone])  Hospital Day(s): 1.   Post op day(s):  [name]   Interval History: Patient seen and examined, no acute events or new complaints overnight. Patient reports diminished abdominal pain and tenderness, denies nausea/vomiting or significant bowel activity.  Review of Systems:  Constitutional: denies fever, chills  HEENT: denies cough or congestion  Respiratory: denies any shortness of breath  Cardiovascular: denies chest pain or palpitations  Gastrointestinal: denies N/V, or diarrhea/and bowel function as per interval history Genitourinary: denies burning with urination or urinary frequency Musculoskeletal: denies pain, decreased motor or sensation Integumentary: denies any other rashes or skin discolorations Neurological: denies HA or vision/hearing changes   Vital signs in last 24 hours: [min-max] current  Temp:  [98.7 F (37.1 C)-99.4 F (37.4 C)] 98.8 F (37.1 C) (08/17 0420) Pulse Rate:  [86-104] 86 (08/17 0420) Resp:  [12-20] 16 (08/17 0420) BP: (126-169)/(62-90) 135/62 (08/17 0420) SpO2:  [94 %-100 %] 95 % (08/17 0420) Weight:  [59 kg] 59 kg (08/16 1532)       Weight: 59 kg     Intake/Output last 2 shifts:  08/16 0701 - 08/17 0700 In: 55.7 [I.V.:0.7; IV Piggyback:50] Out: 5 [Drains:5]   Physical Exam:  Constitutional: alert, cooperative and no distress  HENT: normocephalic without obvious abnormality  Eyes: PERRL, EOM's grossly intact and symmetric  Neuro: CN II - XII grossly intact and symmetric without deficit  Respiratory: breathing non-labored at rest  Cardiovascular: regular rate and sinus rhythm  Gastrointestinal: Left lower quadrant drain with adjacent tenderness, otherwise soft, non-tender, and non-distended Musculoskeletal: no edema or wounds, motor and sensation grossly intact, NT    Labs:     Latest Ref Rng & Units 08/01/2023    4:26 AM 07/31/2023   10:44 AM 07/07/2023   12:47 PM  CBC  WBC 4.0 -  10.5 K/uL 9.7  12.9  7.9   Hemoglobin 12.0 - 15.0 g/dL 9.6  29.5  9.3   Hematocrit 36.0 - 46.0 % 30.9  35.0  31.1   Platelets 150 - 400 K/uL 232  317  267       Latest Ref Rng & Units 08/01/2023    4:26 AM 07/31/2023   10:44 AM 01/27/2023    7:42 AM  CMP  Glucose 70 - 99 mg/dL 284  132    BUN 8 - 23 mg/dL 53  59    Creatinine 4.40 - 1.00 mg/dL 1.02  7.25  3.66   Sodium 135 - 145 mmol/L 141  139    Potassium 3.5 - 5.1 mmol/L 4.3  4.4    Chloride 98 - 111 mmol/L 112  107    CO2 22 - 32 mmol/L 21  22    Calcium 8.9 - 10.3 mg/dL 8.0  8.9    Total Protein 6.5 - 8.1 g/dL  6.7    Total Bilirubin 0.3 - 1.2 mg/dL  0.4    Alkaline Phos 38 - 126 U/L  64    AST 15 - 41 U/L  18    ALT 0 - 44 U/L  14     Gram Stain ABUNDANT WBC PRESENT, PREDOMINANTLY PMN ABUNDANT GRAM NEGATIVE RODS FEW GRAM POSITIVE COCCI MODERATE GRAM POSITIVE RODS    Imaging studies: No new pertinent imaging studies   Assessment/Plan:  77 y.o. female with diverticulitis with abscess   s/p percutaneous drain for same, complicated by pertinent comorbidities including: Patient Active Problem List   Diagnosis Date Noted  Diverticulitis 07/31/2023   Diverticulitis of colon with perforation 07/31/2023   Acute pain of right shoulder 07/06/2023   History of GI bleed 12/05/2022   S/P repair of paraesophageal hernia 08/26/2022   Hiatal hernia 07/26/2022   Anemia of chronic disease 04/15/2022   H/O sarcoidosis 01/29/2022   Ganglion cyst of wrist, left 01/29/2022   Lumbar degenerative disc disease 06/07/2020   Lumbar spondylosis 06/07/2020   Chronic pain syndrome 04/18/2020   Sacroiliac joint pain 04/18/2020   Chronic heart failure with preserved ejection fraction (HFpEF) (HCC) 01/11/2020   Grade I diastolic dysfunction 12/15/2019   History of CVA (cerebrovascular accident) 12/01/2019   Hyperparathyroidism due to renal insufficiency (HCC) 08/31/2019   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  -We could consider Dulcolax suppositories, possible fleets to help with distal impaction.  -Continue IV Zosyn, follow-up culture and sensitivity.  -Pain control, DVT prophylaxis, glycemic control, appreciate hospitalist assistance with comorbidities.  All of the above findings and recommendations were discussed with the patient, and the medical team, and all of patient's questions were answered to her expressed satisfaction.   -- Campbell Lerner M.D., [facility] 08/01/2023 6:18 AM

## 2023-08-02 DIAGNOSIS — K5720 Diverticulitis of large intestine with perforation and abscess without bleeding: Secondary | ICD-10-CM | POA: Diagnosis not present

## 2023-08-02 LAB — BASIC METABOLIC PANEL
Anion gap: 9 (ref 5–15)
BUN: 57 mg/dL — ABNORMAL HIGH (ref 8–23)
CO2: 21 mmol/L — ABNORMAL LOW (ref 22–32)
Calcium: 7.7 mg/dL — ABNORMAL LOW (ref 8.9–10.3)
Chloride: 109 mmol/L (ref 98–111)
Creatinine, Ser: 2.6 mg/dL — ABNORMAL HIGH (ref 0.44–1.00)
GFR, Estimated: 19 mL/min — ABNORMAL LOW (ref >60)
Glucose, Bld: 83 mg/dL (ref 70–99)
Potassium: 4.1 mmol/L (ref 3.5–5.1)
Sodium: 139 mmol/L (ref 135–145)

## 2023-08-02 LAB — GLUCOSE, CAPILLARY
Glucose-Capillary: 104 mg/dL — ABNORMAL HIGH (ref 70–99)
Glucose-Capillary: 111 mg/dL — ABNORMAL HIGH (ref 70–99)
Glucose-Capillary: 112 mg/dL — ABNORMAL HIGH (ref 70–99)
Glucose-Capillary: 156 mg/dL — ABNORMAL HIGH (ref 70–99)
Glucose-Capillary: 61 mg/dL — ABNORMAL LOW (ref 70–99)
Glucose-Capillary: 78 mg/dL (ref 70–99)
Glucose-Capillary: 99 mg/dL (ref 70–99)

## 2023-08-02 LAB — CBC
HCT: 26.1 % — ABNORMAL LOW (ref 36.0–46.0)
Hemoglobin: 8.3 g/dL — ABNORMAL LOW (ref 12.0–15.0)
MCH: 27.4 pg (ref 26.0–34.0)
MCHC: 31.8 g/dL (ref 30.0–36.0)
MCV: 86.1 fL (ref 80.0–100.0)
Platelets: 198 10*3/uL (ref 150–400)
RBC: 3.03 MIL/uL — ABNORMAL LOW (ref 3.87–5.11)
RDW: 16 % — ABNORMAL HIGH (ref 11.5–15.5)
WBC: 7.4 10*3/uL (ref 4.0–10.5)
nRBC: 0 % (ref 0.0–0.2)

## 2023-08-02 LAB — MAGNESIUM: Magnesium: 2 mg/dL (ref 1.7–2.4)

## 2023-08-02 MED ORDER — PIPERACILLIN-TAZOBACTAM 3.375 G IVPB
3.3750 g | Freq: Two times a day (BID) | INTRAVENOUS | Status: DC
  Administered 2023-08-02 – 2023-08-03 (×3): 3.375 g via INTRAVENOUS
  Filled 2023-08-02 (×3): qty 50

## 2023-08-02 NOTE — Progress Notes (Signed)
  Hospitalist Consult Daily PROGRESS NOTE    EESHA IGLESIA  ZOX:[id number] DOB: 06-18-1946 DOA: 07/31/2023 PCP: Marjie Skiff, NP  215A/215A-AA  LOS: 2 days   Brief hospital course:   Assessment & Plan: Tonya [name] is a 77 y.o. female with past medical history of HFpEF, stage IV CKD, anemia of chronic disease, history of CVA with without deficits, GERD, hypertension, hyperlipidemia presenting with perforated diverticulitis with abscess.  Hospitalist service consult requested for medical co-management.   * Diverticulitis of colon with perforation S/p IR drain placement on 8/16 Acute severe lower abdominal pain with noted Pericolonic 2.6 x 1.8 x 3.4 cm fluid collection along the anti mesenteric aspect of the distal descending colon most concerning for an abscess on imaging --cont zosyn --cont drain management --cont clear liquid diet, per surgery  Chronic heart failure with preserved ejection fraction (HFpEF) (HCC) 2D echo December 2020 with EF of 60 to 65% and grade 1 diastolic dysfunction Appears mildly dry to euvolemic on exam --cont MIVF@50  --hold home torsemide --hold irbesartan --cont coreg   Anemia of chronic disease Hgb 11  Baseline hgb 10-11 Monitor    History of CVA (cerebrovascular accident) Remote history of CVA in December 2020 --cont statin  CKD (chronic kidney disease) stage 4, GFR 15-29 ml/min (HCC) Creatinine 2.2 today with baseline creatinine around 2.2-2.8. At baseline  Hyperlipidemia associated with type 2 diabetes mellitus (HCC) Cont statin   HTN --cont coreg --hold irbesartan  Distal stool impaction --cont Dulcolax supp   DVT prophylaxis: Heparin SQ Code Status: Full code  Family Communication:  Level of care: Med-Surg Dispo:   The patient is from: home Anticipated d/c is to: home Anticipated d/c date is: >3 days   Subjective and Interval History:  Pt reported having BM, not hard.   Objective: Vitals:   08/01/23 1956  08/02/23 0423 08/02/23 0842 08/02/23 1612  BP: 138/62 (!) 112/56 122/61 (!) 127/92  Pulse: 83 65 64 86  Resp: 16 16 16 17   Temp: 98.2 F (36.8 C) 97.7 F (36.5 C) 98 F (36.7 C) 98 F (36.7 C)  TempSrc: Oral Oral Oral   SpO2: 95% 97% 100% 96%  Weight:        Intake/Output Summary (Last 24 hours) at 08/02/2023 1634 Last data filed at 08/02/2023 1458 Gross per 24 hour  Intake 1564.31 ml  Output 915 ml  Net 649.31 ml   Filed Weights   07/31/23 1532  Weight: 59 kg    Examination:   Constitutional: NAD, AAOx3 HEENT: conjunctivae and lids normal, EOMI CV: No cyanosis.   RESP: normal respiratory effort, on RA Neuro: II - XII grossly intact.   Psych: Normal mood and affect.  Appropriate judgement and reason   Data Reviewed: I have personally reviewed labs and imaging studies  Time spent: 35 minutes  Darlin Priestly, MD [facility] If 7PM-7AM, please contact night-coverage 08/02/2023, 4:34 PM

## 2023-08-02 NOTE — Progress Notes (Signed)
Glucose level 61 - patient given orange juice to drink

## 2023-08-02 NOTE — Progress Notes (Signed)
[facility] SURGICAL PROGRESS NOTE (cpt [phone])  Hospital Day(s): 2.   Post op day(s):  [name]   Interval History: Patient seen and examined, no acute events or new complaints overnight.  Patient visiting with her daughter and son-in-law.  Patient reports improving localized abdominal pain and tenderness to drain site, denies nausea/vomiting or significant bowel activity.  She remains somewhat anorexic.  Currently awaiting to get changed due to dysfunctional PureWick.  Had degree of bowel activity following suppository.  Review of Systems:  Constitutional: denies fever, chills  HEENT: denies cough or congestion  Respiratory: denies any shortness of breath  Cardiovascular: denies chest pain or palpitations  Gastrointestinal: denies N/V, or diarrhea/and bowel function as per interval history Genitourinary: denies burning with urination or urinary frequency Musculoskeletal: denies pain, decreased motor or sensation Integumentary: denies any other rashes or skin discolorations Neurological: denies HA or vision/hearing changes   Vital signs in last 24 hours: [min-max] current  Temp:  [97.6 F (36.4 C)-98.2 F (36.8 C)] 98 F (36.7 C) (08/18 0842) Pulse Rate:  [64-83] 64 (08/18 0842) Resp:  [16] 16 (08/18 0842) BP: (109-138)/(56-62) 122/61 (08/18 0842) SpO2:  [95 %-100 %] 100 % (08/18 0842)       Weight: 59 kg     Intake/Output last 2 shifts:  08/17 0701 - 08/18 0700 In: 1364.3 [P.O.:1010; I.V.:349.3] Out: 415 [Urine:400; Drains:15]   Physical Exam:  Constitutional: alert, cooperative and no distress  HENT: normocephalic without obvious abnormality  Eyes: PERRL, EOM's grossly intact and symmetric  Neuro: CN II - XII grossly intact and symmetric without deficit  Respiratory: breathing non-labored at rest  Cardiovascular: regular rate and sinus rhythm  Gastrointestinal: Left lower quadrant drain with adjacent tenderness, otherwise soft, non-tender, and  non-distended Musculoskeletal: no edema or wounds, motor and sensation grossly intact, NT    Labs:     Latest Ref Rng & Units 08/02/2023    4:03 AM 08/01/2023    4:26 AM 07/31/2023   10:44 AM  CBC  WBC 4.0 - 10.5 K/uL 7.4  9.7  12.9   Hemoglobin 12.0 - 15.0 g/dL 8.3  9.6  29.5   Hematocrit 36.0 - 46.0 % 26.1  30.9  35.0   Platelets 150 - 400 K/uL 198  232  317       Latest Ref Rng & Units 08/02/2023    4:03 AM 08/01/2023    4:26 AM 07/31/2023   10:44 AM  CMP  Glucose 70 - 99 mg/dL 83  188  416   BUN 8 - 23 mg/dL 57  53  59   Creatinine 0.44 - 1.00 mg/dL 6.06  3.01  6.01   Sodium 135 - 145 mmol/L 139  141  139   Potassium 3.5 - 5.1 mmol/L 4.1  4.3  4.4   Chloride 98 - 111 mmol/L 109  112  107   CO2 22 - 32 mmol/L 21  21  22    Calcium 8.9 - 10.3 mg/dL 7.7  8.0  8.9   Total Protein 6.5 - 8.1 g/dL   6.7   Total Bilirubin 0.3 - 1.2 mg/dL   0.4   Alkaline Phos 38 - 126 U/L   64   AST 15 - 41 U/L   18   ALT 0 - 44 U/L   14    Gram Stain ABUNDANT WBC PRESENT, PREDOMINANTLY PMN ABUNDANT GRAM NEGATIVE RODS FEW GRAM POSITIVE COCCI MODERATE GRAM POSITIVE RODS    Imaging studies: No new pertinent  imaging studies   Assessment/Plan:  77 y.o. female with diverticulitis with abscess   s/p percutaneous drain for same, complicated by pertinent comorbidities including: Patient Active Problem List   Diagnosis Date Noted   Diverticulitis 07/31/2023   Diverticulitis of colon with perforation 07/31/2023   Acute pain of right shoulder 07/06/2023   History of GI bleed 12/05/2022   S/P repair of paraesophageal hernia 08/26/2022   Hiatal hernia 07/26/2022   Anemia of chronic disease 04/15/2022   H/O sarcoidosis 01/29/2022   Ganglion cyst of wrist, left 01/29/2022   Lumbar degenerative disc disease 06/07/2020   Lumbar spondylosis 06/07/2020   Chronic pain syndrome 04/18/2020   Sacroiliac joint pain 04/18/2020   Chronic heart failure with preserved ejection fraction (HFpEF) (HCC) 01/11/2020    Grade I diastolic dysfunction 12/15/2019   History of CVA (cerebrovascular accident) 12/01/2019   Hyperparathyroidism due to renal insufficiency (HCC) 08/31/2019   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  -Continue Dulcolax suppositories, possible fleets to help with distal impaction.  -Continue IV Zosyn, follow-up culture and sensitivity.  Abundant E. coli so far.  -Pain control, DVT prophylaxis, glycemic control, appreciate hospitalist assistance with comorbidities.  All of the above findings and recommendations were discussed with the patient, and the medical team, and all of patient's questions were answered to her expressed satisfaction.   -- Campbell Lerner M.D., [facility] 08/02/2023 12:21 PM

## 2023-08-02 NOTE — TOC CM/SW Note (Signed)
Transition of Care [facility]) - Inpatient Brief Assessment   Patient Details  Name: Tonya [name] MRN: [id number] Date of Birth: 1946-09-26  Transition of [facility] CM/SW Contact:    Liliana Cline, LCSW Phone Number: 08/02/2023, 9:06 AM   Clinical Narrative:    Transition of Care Asessment: Insurance and Status: Insurance coverage has been reviewed Patient has primary care physician: Yes     Prior/Current Home Services: No current home services Social Determinants of Health Reivew: SDOH reviewed no interventions necessary Readmission risk has been reviewed: Yes Transition of care needs: no transition of care needs at this time

## 2023-08-03 DIAGNOSIS — K5720 Diverticulitis of large intestine with perforation and abscess without bleeding: Secondary | ICD-10-CM | POA: Diagnosis not present

## 2023-08-03 LAB — CBC
HCT: 25.9 % — ABNORMAL LOW (ref 36.0–46.0)
Hemoglobin: 8.4 g/dL — ABNORMAL LOW (ref 12.0–15.0)
MCH: 27.7 pg (ref 26.0–34.0)
MCHC: 32.4 g/dL (ref 30.0–36.0)
MCV: 85.5 fL (ref 80.0–100.0)
Platelets: 220 10*3/uL (ref 150–400)
RBC: 3.03 MIL/uL — ABNORMAL LOW (ref 3.87–5.11)
RDW: 15.7 % — ABNORMAL HIGH (ref 11.5–15.5)
WBC: 6.7 10*3/uL (ref 4.0–10.5)
nRBC: 0 % (ref 0.0–0.2)

## 2023-08-03 LAB — BASIC METABOLIC PANEL
Anion gap: 6 (ref 5–15)
BUN: 50 mg/dL — ABNORMAL HIGH (ref 8–23)
CO2: 24 mmol/L (ref 22–32)
Calcium: 7.9 mg/dL — ABNORMAL LOW (ref 8.9–10.3)
Chloride: 110 mmol/L (ref 98–111)
Creatinine, Ser: 2.71 mg/dL — ABNORMAL HIGH (ref 0.44–1.00)
GFR, Estimated: 18 mL/min — ABNORMAL LOW (ref >60)
Glucose, Bld: 100 mg/dL — ABNORMAL HIGH (ref 70–99)
Potassium: 4.1 mmol/L (ref 3.5–5.1)
Sodium: 140 mmol/L (ref 135–145)

## 2023-08-03 LAB — GLUCOSE, CAPILLARY
Glucose-Capillary: 116 mg/dL — ABNORMAL HIGH (ref 70–99)
Glucose-Capillary: 68 mg/dL — ABNORMAL LOW (ref 70–99)
Glucose-Capillary: 93 mg/dL (ref 70–99)
Glucose-Capillary: 170 mg/dL — ABNORMAL HIGH (ref 70–99)
Glucose-Capillary: 154 mg/dL — ABNORMAL HIGH (ref 70–99)
Glucose-Capillary: 92 mg/dL (ref 70–99)
Glucose-Capillary: 101 mg/dL — ABNORMAL HIGH (ref 70–99)

## 2023-08-03 LAB — MAGNESIUM: Magnesium: 2.1 mg/dL (ref 1.7–2.4)

## 2023-08-03 MED ORDER — INSULIN ASPART 100 UNIT/ML IJ SOLN
0.0000 [IU] | INTRAMUSCULAR | Status: DC
  Administered 2023-08-03: 1 [IU] via SUBCUTANEOUS
  Filled 2023-08-03: qty 1

## 2023-08-03 MED ORDER — INSULIN ASPART 100 UNIT/ML IJ SOLN: 0.0000 [IU] | INTRAMUSCULAR | Status: DC

## 2023-08-03 MED ORDER — SIMETHICONE 80 MG PO CHEW
80.0000 mg | CHEWABLE_TABLET | Freq: Four times a day (QID) | ORAL | Status: DC | PRN
  Administered 2023-08-06: 80 mg via ORAL
  Filled 2023-08-03: qty 1

## 2023-08-03 MED ORDER — DEXTROSE-SODIUM CHLORIDE 5-0.45 % IV SOLN: INTRAVENOUS | Status: DC

## 2023-08-03 MED ORDER — SODIUM CHLORIDE 0.9 % IV SOLN
3.0000 g | Freq: Two times a day (BID) | INTRAVENOUS | Status: DC
  Administered 2023-08-03 – 2023-08-05 (×5): 3 g via INTRAVENOUS
  Filled 2023-08-03 (×6): qty 8

## 2023-08-03 NOTE — Progress Notes (Signed)
[facility] SURGICAL PROGRESS NOTE (cpt [phone])  Hospital Day(s): 3.   Interval History: Patient seen and examined, no acute events or new complaints overnight. Patient reports she is doing a little better. Most of her complaints remain soreness in LLQ at drain site. She denies fever, chills, nausea, emesis. Previous leukocytosis remains resolved; 6.7K. Hgb to 8.4. Renal function seems to be at her baseline given CKD; sCr - 2.71; UO - 500 ccs + unmeasured. No significant electrolyte derangements. Percutaneous drain with 10 ccs out recorded; serous. Cx from this grew pan-sensitive E coli. She is currently on Zosyn. She is on CLD. Having documented bowel function.    Review of Systems:  Constitutional: denies fever, chills  HEENT: denies cough or congestion  Respiratory: denies any shortness of breath  Cardiovascular: denies chest pain or palpitations  Gastrointestinal: + abdominal pain, denied N/V Musculoskeletal: denies pain, decreased motor or sensation  Vital signs in last 24 hours: [min-max] current  Temp:  [98 F (36.7 C)-99 F (37.2 C)] 99 F (37.2 C) (08/19 0334) Pulse Rate:  [64-86] 70 (08/19 0334) Resp:  [16-20] 20 (08/19 0334) BP: (122-130)/(57-92) 122/57 (08/19 0334) SpO2:  [96 %-100 %] 97 % (08/19 0334)       Weight: 59 kg     Intake/Output last 2 shifts:  08/18 0701 - 08/19 0700 In: 1644.1 [P.O.:1350; I.V.:194.1; IV Piggyback:100] Out: 510 [Urine:500; Drains:10]   Physical Exam:  Constitutional: Somewhat somnolent this AM; arouses easily to verbal stimulus but dozes off to sleep during interview HENT: normocephalic without obvious abnormality  Eyes: PERRL, EOM's grossly intact and symmetric  Respiratory: breathing non-labored at rest  Cardiovascular: regular rate and sinus rhythm  Gastrointestinal: soft, tenderness in LLQ near drain site; non-distended. No appreciable rebound/guarding this AM Musculoskeletal: no edema or wounds, motor and sensation  grossly intact, NT    Labs:     Latest Ref Rng & Units 08/03/2023    4:54 AM 08/02/2023    4:03 AM 08/01/2023    4:26 AM  CBC  WBC 4.0 - 10.5 K/uL 6.7  7.4  9.7   Hemoglobin 12.0 - 15.0 g/dL 8.4  8.3  9.6   Hematocrit 36.0 - 46.0 % 25.9  26.1  30.9   Platelets 150 - 400 K/uL 220  198  232       Latest Ref Rng & Units 08/03/2023    4:54 AM 08/02/2023    4:03 AM 08/01/2023    4:26 AM  CMP  Glucose 70 - 99 mg/dL 604  83  540   BUN 8 - 23 mg/dL 50  57  53   Creatinine 0.44 - 1.00 mg/dL 9.81  1.91  4.78   Sodium 135 - 145 mmol/L 140  139  141   Potassium 3.5 - 5.1 mmol/L 4.1  4.1  4.3   Chloride 98 - 111 mmol/L 110  109  112   CO2 22 - 32 mmol/L 24  21  21    Calcium 8.9 - 10.3 mg/dL 7.9  7.7  8.0      Imaging studies: No new pertinent imaging studies   Assessment/Plan: (ICD-10's: K24.92) 77 y.o. female with perforated diverticulitis with abscess and scattered pneumoperitoneum s/p percutaneous drain placement on 08/16.     - Fortunately, she seems to be responding reasonably well to conservative measure. She is afebrile and WBC normalized x2 days now. She of course remains tender in LLQ but this seems more attributed to herr drain itself. We may not  be "out of the woods" yet, but for now, no plans to proceed with any emergent intervention (ie: Hartman's). She understands that should she fail to improve or clinically deteriorate in any fashion, we would need to pursue this sooner. If she can recover from this insult, she will benefit from discussion of elective sigmoid colectomy in the interval fashion as outpatient.    - CLD for now; IF pain continues to improve, we can slowly advance diet - Continue IV Abx (Zosyn); reviewed Cx - Monitor abdominal examination; on-going bowel function  - Continue drain; monitor and record output  - Pain control prn; antiemetics prn   - Appreciate medicine assistance with comorbidities - Okay to mobilize; low threshold to engage therapies if  needed  All of the above findings and recommendations were discussed with the patient, and the medical team, and all of patient's questions were answered to her expressed satisfaction.  -- Lynden Oxford, PA-C [facility] 08/03/2023, 7:50 AM M-F: 7am - 4pm

## 2023-08-03 NOTE — Inpatient Diabetes Management (Signed)
Inpatient Diabetes Program Recommendations  AACE/ADA: New Consensus Statement on Inpatient Glycemic Control (2015)  Target Ranges:  Prepandial:   less than 140 mg/dL      Peak postprandial:   less than 180 mg/dL (1-2 hours)      Critically ill patients:  140 - 180 mg/dL   Lab Results  Component Value Date   GLUCAP 170 (H) 08/03/2023   HGBA1C 8.8 (H) 07/31/2023    Review of Glycemic Control  Diabetes history: DM2 Current orders for Inpatient glycemic control: Novolog 0-9 units correction q 4 hrs.  Inpatient Diabetes Program Recommendations:   Patient had hypoglycemia post Novolog correction. Please consider: -Decrease Novolog correction to 0-6 units q 4 hrs.  Patient sees Endocrinologist Dr. Tedd Sias with last office visit 07/16/23.  Per note, prescriptions include: Humalog 10 units before breakfast, 8 units before afternoon and evening snacks. Actos 30 mg every day  Thank you, Billy Fischer. Kharter Brew, RN, MSN, CDE  Diabetes Coordinator Inpatient Glycemic Control Team Team Pager [phone] (8am-5pm) 08/03/2023 2:01 PM

## 2023-08-03 NOTE — Progress Notes (Signed)
  Hospitalist Consult Daily PROGRESS NOTE    JERRAH THREAT  JOA:[id number] DOB: 06-Feb-1946 DOA: 07/31/2023 PCP: Marjie Skiff, NP  215A/215A-AA  LOS: 3 days   Brief hospital course:   Assessment & Plan: Tonya [name] is a 77 y.o. female with past medical history of HFpEF, stage IV CKD, anemia of chronic disease, history of CVA with without deficits, GERD, hypertension, hyperlipidemia presenting with perforated diverticulitis with abscess.  Hospitalist service consult requested for medical co-management.   * Diverticulitis of colon with perforation S/p IR drain placement on 8/16 Acute severe lower abdominal pain with noted Pericolonic 2.6 x 1.8 x 3.4 cm fluid collection along the anti mesenteric aspect of the distal descending colon most concerning for an abscess on imaging.  Started on zosyn Plan: --de-escalate to Unasyn, per surgery --cont drain management --cont clear liquid diet, per surgery  Chronic heart failure with preserved ejection fraction (HFpEF) (HCC) 2D echo December 2020 with EF of 60 to 65% and grade 1 diastolic dysfunction Appears mildly dry to euvolemic on exam --s/p MIVF Plan: --d/c MIVF --hold home torsemide --hold irbesartan --cont coreg   Anemia of chronic disease Hgb 11  Baseline hgb 10-11 Monitor   History of CVA (cerebrovascular accident) Remote history of CVA in December 2020 --cont statin  CKD (chronic kidney disease) stage 4, GFR 15-29 ml/min (HCC) Creatinine 2.2 today with baseline creatinine around 2.2-2.8. At baseline  Hyperlipidemia associated with type 2 diabetes mellitus (HCC) Cont statin   HTN --cont coreg --Hold irbesartan   Distal stool impaction --cont Dulcolax supp  Hypoglycemia --due to SSI and reduce oral intake --s/p D5 infusion --hold SSI for now --BG q4h to monitor for hypoglycemia   DVT prophylaxis: Heparin SQ Code Status: Full code  Family Communication:  Level of care: Med-Surg Dispo:   The patient  is from: home Anticipated d/c is to: home Anticipated d/c date is: per primary team   Subjective and Interval History:  No dyspnea.   Objective: Vitals:   08/03/23 0334 08/03/23 0841 08/03/23 0900 08/03/23 1611  BP: (!) 122/57 (!) 144/66  126/83  Pulse: 70 74  63  Resp: 20 16  18   Temp: 99 F (37.2 C) 98.8 F (37.1 C)  98.1 F (36.7 C)  TempSrc: Oral Oral  Oral  SpO2: 97% 100%  99%  Weight:   73.7 kg     Intake/Output Summary (Last 24 hours) at 08/03/2023 1756 Last data filed at 08/03/2023 1100 Gross per 24 hour  Intake 954.08 ml  Output 0 ml  Net 954.08 ml   Filed Weights   07/31/23 1532 08/03/23 0900  Weight: 59 kg 73.7 kg    Examination:   Constitutional: NAD, AAOx3, sitting in recliner HEENT: conjunctivae and lids normal, EOMI CV: No cyanosis.   RESP: normal respiratory effort, on RA Extremities: No effusions, edema in BLE SKIN: warm, dry   Data Reviewed: I have personally reviewed labs and imaging studies  Time spent: 50 minutes  Darlin Priestly, MD [facility] If 7PM-7AM, please contact night-coverage 08/03/2023, 5:56 PM

## 2023-08-03 NOTE — Progress Notes (Signed)
Mobility Specialist - Progress Note    08/03/23 1144  Mobility  Activity Ambulated with assistance in room;Transferred from bed to chair  Level of Assistance Minimal assist, patient does 75% or more  Assistive Device Front wheel walker  Distance Ambulated (ft) 5 ft  Range of Motion/Exercises Active;Right leg;Left leg  Activity Response Tolerated well  $Mobility charge 1 Mobility    Pt lying in bed upon arrival, utilizing RA. Pt agreeable to activity. Completed bed mobility modI. Pain at incision site. Pt STS and ambulation in room with [name], pushes RW aside and opts for single HHA. Loose, watery BM with supervision for standing peri-care. Pt left in chair with alarm set, needs in reach.   Filiberto Pinks Mobility Specialist 08/03/23, 11:46 AM

## 2023-08-03 NOTE — Care Management Important Message (Signed)
Important Message  Patient Details  Name: Tonya [name] MRN: [id number] Date of Birth: 1946-06-12   Medicare Important Message Given:  Yes     [name] 08/03/2023, 11:09 AM

## 2023-08-04 DIAGNOSIS — K5720 Diverticulitis of large intestine with perforation and abscess without bleeding: Secondary | ICD-10-CM | POA: Diagnosis not present

## 2023-08-04 LAB — AEROBIC/ANAEROBIC CULTURE W GRAM STAIN (SURGICAL/DEEP WOUND)

## 2023-08-04 LAB — BASIC METABOLIC PANEL
Anion gap: 9 (ref 5–15)
BUN: 43 mg/dL — ABNORMAL HIGH (ref 8–23)
CO2: 21 mmol/L — ABNORMAL LOW (ref 22–32)
Calcium: 8.1 mg/dL — ABNORMAL LOW (ref 8.9–10.3)
Chloride: 110 mmol/L (ref 98–111)
Creatinine, Ser: 2.51 mg/dL — ABNORMAL HIGH (ref 0.44–1.00)
GFR, Estimated: 19 mL/min — ABNORMAL LOW (ref >60)
Glucose, Bld: 128 mg/dL — ABNORMAL HIGH (ref 70–99)
Potassium: 4.5 mmol/L (ref 3.5–5.1)
Sodium: 140 mmol/L (ref 135–145)

## 2023-08-04 LAB — GLUCOSE, CAPILLARY
Glucose-Capillary: 76 mg/dL (ref 70–99)
Glucose-Capillary: 109 mg/dL — ABNORMAL HIGH (ref 70–99)
Glucose-Capillary: 99 mg/dL (ref 70–99)
Glucose-Capillary: 141 mg/dL — ABNORMAL HIGH (ref 70–99)
Glucose-Capillary: 235 mg/dL — ABNORMAL HIGH (ref 70–99)
Glucose-Capillary: 168 mg/dL — ABNORMAL HIGH (ref 70–99)

## 2023-08-04 LAB — CBC
HCT: 29.5 % — ABNORMAL LOW (ref 36.0–46.0)
Hemoglobin: 9 g/dL — ABNORMAL LOW (ref 12.0–15.0)
MCH: 26.8 pg (ref 26.0–34.0)
MCHC: 30.5 g/dL (ref 30.0–36.0)
MCV: 87.8 fL (ref 80.0–100.0)
Platelets: 254 10*3/uL (ref 150–400)
RBC: 3.36 MIL/uL — ABNORMAL LOW (ref 3.87–5.11)
RDW: 15.8 % — ABNORMAL HIGH (ref 11.5–15.5)
WBC: 6.6 10*3/uL (ref 4.0–10.5)
nRBC: 0 % (ref 0.0–0.2)

## 2023-08-04 LAB — MAGNESIUM: Magnesium: 2.1 mg/dL (ref 1.7–2.4)

## 2023-08-04 MED ORDER — INSULIN ASPART 100 UNIT/ML IJ SOLN
0.0000 [IU] | Freq: Three times a day (TID) | INTRAMUSCULAR | Status: DC
  Administered 2023-08-05: 2 [IU] via SUBCUTANEOUS
  Administered 2023-08-05 – 2023-08-06 (×2): 1 [IU] via SUBCUTANEOUS
  Administered 2023-08-06: 3 [IU] via SUBCUTANEOUS
  Filled 2023-08-04 (×3): qty 1

## 2023-08-04 MED ORDER — IRBESARTAN 150 MG PO TABS
75.0000 mg | ORAL_TABLET | Freq: Every day | ORAL | Status: DC
  Administered 2023-08-04 – 2023-08-05 (×2): 75 mg via ORAL
  Filled 2023-08-04 (×2): qty 1

## 2023-08-04 MED ORDER — INSULIN ASPART 100 UNIT/ML IJ SOLN
0.0000 [IU] | Freq: Every day | INTRAMUSCULAR | Status: DC
  Filled 2023-08-04 (×2): qty 1

## 2023-08-04 MED ORDER — HYDROCODONE-ACETAMINOPHEN 5-325 MG PO TABS
1.0000 | ORAL_TABLET | ORAL | Status: DC | PRN
  Administered 2023-08-04: 1 via ORAL
  Filled 2023-08-04: qty 1

## 2023-08-04 NOTE — Progress Notes (Signed)
Mobility Specialist - Progress Note   08/04/23 1127  Mobility  Activity Ambulated with assistance in room;Transferred from bed to chair  Level of Assistance Standby assist, set-up cues, supervision of patient - no hands on  Assistive Device Front wheel walker  Distance Ambulated (ft) 25 ft  Activity Response Tolerated well  $Mobility charge 1 Mobility     Pt lying in bed upon arrival, utilizing RA. Pt completed bed mobility modI; STS and ambulation with minG. No LOB. Pt ambulated to recliner with alarm set, needs in reach. Blinds opened for increased stimuli.    Filiberto Pinks Mobility Specialist 08/04/23, 11:28 AM

## 2023-08-04 NOTE — Progress Notes (Signed)
[facility] SURGICAL PROGRESS NOTE (cpt [phone])  Hospital Day(s): 4.   Interval History: Patient seen and examined, no acute events or new complaints overnight. Patient reports she continues to have soreness at the LLQ drain site; worse with trying to move. No fever chills, nausea, emesis. WBC remains normal; 6.6K. Hgb to 9.0; improved. Renal function seems to be at her baseline given CKD; sCr - 2.51; UO - unmeasured. No significant electrolyte derangements. Percutaneous drain with 13 ccs out recorded; serous with some sediment in tubing. Cx from this grew pan-sensitive E coli. Switched to Unasyn. She is on CLD; Having documented bowel function.    Review of Systems:  Constitutional: denies fever, chills  HEENT: denies cough or congestion  Respiratory: denies any shortness of breath  Cardiovascular: denies chest pain or palpitations  Gastrointestinal: + abdominal pain (LLQ; at drain), denied N/V Musculoskeletal: denies pain, decreased motor or sensation  Vital signs in last 24 hours: [min-max] current  Temp:  [97.9 F (36.6 C)-98.8 F (37.1 C)] 98.3 F (36.8 C) (08/20 0717) Pulse Rate:  [63-74] 67 (08/20 0717) Resp:  [16-18] 16 (08/20 0717) BP: (126-163)/(65-83) 163/72 (08/20 0717) SpO2:  [96 %-100 %] 99 % (08/20 0717) Weight:  [73.7 kg] 73.7 kg (08/19 0900)       Weight: 73.7 kg     Intake/Output last 2 shifts:  08/19 0701 - 08/20 0700 In: 490 [P.O.:480] Out: 13 [Drains:13]   Physical Exam:  Constitutional: Somewhat somnolent this AM; arouses easily to verbal stimulus but dozes off to sleep during interview HENT: normocephalic without obvious abnormality  Eyes: PERRL, EOM's grossly intact and symmetric  Respiratory: breathing non-labored at rest  Cardiovascular: regular rate and sinus rhythm  Gastrointestinal: soft, tenderness in LLQ near drain site remains unchanged; non-distended. No appreciable rebound/guarding this AM Musculoskeletal: no edema or wounds,  motor and sensation grossly intact, NT    Labs:     Latest Ref Rng & Units 08/04/2023    5:45 AM 08/03/2023    4:54 AM 08/02/2023    4:03 AM  CBC  WBC 4.0 - 10.5 K/uL 6.6  6.7  7.4   Hemoglobin 12.0 - 15.0 g/dL 9.0  8.4  8.3   Hematocrit 36.0 - 46.0 % 29.5  25.9  26.1   Platelets 150 - 400 K/uL 254  220  198       Latest Ref Rng & Units 08/04/2023    5:45 AM 08/03/2023    4:54 AM 08/02/2023    4:03 AM  CMP  Glucose 70 - 99 mg/dL 621  308  83   BUN 8 - 23 mg/dL 43  50  57   Creatinine 0.44 - 1.00 mg/dL 6.57  8.46  9.62   Sodium 135 - 145 mmol/L 140  140  139   Potassium 3.5 - 5.1 mmol/L 4.5  4.1  4.1   Chloride 98 - 111 mmol/L 110  110  109   CO2 22 - 32 mmol/L 21  24  21    Calcium 8.9 - 10.3 mg/dL 8.1  7.9  7.7      Imaging studies: No new pertinent imaging studies   Assessment/Plan: (ICD-10's: K32.92) 77 y.o. female with perforated diverticulitis with abscess and scattered pneumoperitoneum s/p percutaneous drain placement on 08/16.     - Fortunately, she seems to be responding reasonably well to conservative measure. She is afebrile and WBC normalized x3 days now. She of course remains tender in LLQ but this seems more attributed to  herr drain itself. We may not be "out of the woods" yet, but for now, no plans to proceed with any emergent intervention (ie: Hartman's). She understands that should she fail to improve or clinically deteriorate in any fashion, we would need to pursue this sooner. If she can recover from this insult, she will benefit from discussion of elective sigmoid colectomy in the interval fashion as outpatient.    - Will trial FLD; advance cautiously - Continue IV Abx (Unasyn); reviewed Cx - Monitor abdominal examination; on-going bowel function  - Continue drain; monitor and record output  - Pain control prn; antiemetics prn   - Appreciate medicine assistance with comorbidities - Okay to mobilize; low threshold to engage therapies if needed  All of the  above findings and recommendations were discussed with the patient, and the medical team, and all of patient's questions were answered to her expressed satisfaction.  -- Lynden Oxford, PA-C [facility] 08/04/2023, 7:20 AM M-F: 7am - 4pm

## 2023-08-04 NOTE — Plan of Care (Signed)
  Problem: Coping: Goal: Ability to adjust to condition or change in health will improve Outcome: Progressing   Problem: Fluid Volume: Goal: Ability to maintain a balanced intake and output will improve Outcome: Progressing   Problem: Health Behavior/Discharge Planning: Goal: Ability to identify and utilize available resources and services will improve Outcome: Progressing   Problem: Metabolic: Goal: Ability to maintain appropriate glucose levels will improve Outcome: Progressing   Problem: Nutritional: Goal: Maintenance of adequate nutrition will improve Outcome: Progressing   Problem: Skin Integrity: Goal: Risk for impaired skin integrity will decrease Outcome: Progressing

## 2023-08-04 NOTE — Progress Notes (Signed)
  Hospitalist Consult Daily PROGRESS NOTE    Tonya [name]  ZOX:[id number] DOB: 07-Aug-1946 DOA: 07/31/2023 PCP: Marjie Skiff, NP  215A/215A-AA  LOS: 4 days   Brief hospital course:   Assessment & Plan: Tonya [name] is a 77 y.o. female with past medical history of HFpEF, stage IV CKD, anemia of chronic disease, history of CVA, hypertension, presenting with perforated diverticulitis with abscess.  Hospitalist service consult requested for medical co-management.   * Diverticulitis of colon with perforation S/p IR drain placement on 8/16 Acute severe lower abdominal pain with noted Pericolonic 2.6 x 1.8 x 3.4 cm fluid collection along the anti mesenteric aspect of the distal descending colon most concerning for an abscess on imaging.  Started on zosyn, de-escalated to Unasyn by surgery. Plan: --cont Unasyn --cont drain management --advance to Full liquid diet, per surgery  Chronic heart failure with preserved ejection fraction (HFpEF) (HCC) 2D echo December 2020 with EF of 60 to 65% and grade 1 diastolic dysfunction Appears mildly dry to euvolemic on exam --s/p MIVF Plan: --hold home torsemide --resume ARB as irbesartan 75 mg daily  --cont coreg   Anemia of chronic disease Baseline hgb 10-11 Monitor   History of CVA (cerebrovascular accident) Remote history of CVA in December 2020 --cont statin  CKD (chronic kidney disease) stage 4, GFR 15-29 ml/min (HCC) Creatinine 2.2 today with baseline creatinine around 2.2-2.8.  Hyperlipidemia associated with type 2 diabetes mellitus (HCC) Cont statin   HTN --cont coreg --hold home torsemide --resume ARB as irbesartan 75 mg daily   Distal stool impaction --cont Dulcolax supp  Hypoglycemia --due to SSI and reduce oral intake --s/p D5 infusion --BG trending up now that pt is on full liquid diet Plan: --resume SSI at sensitive scale   DVT prophylaxis: Heparin SQ Code Status: Full code  Family Communication:  Level  of care: Med-Surg Dispo:   The patient is from: home Anticipated d/c is to: home Anticipated d/c date is: per primary team   Subjective and Interval History:  Pt had no new complaints.  Tolerating full liquid diet.   Objective: Vitals:   08/03/23 1951 08/04/23 0339 08/04/23 0717 08/04/23 0839  BP: 129/65 (!) 156/77 (!) 163/72   Pulse: 67 70 67   Resp: 18 18 16    Temp: 97.9 F (36.6 C) 98.2 F (36.8 C) 98.3 F (36.8 C)   TempSrc: Oral Oral Oral   SpO2: 100% 96% 99%   Weight:    64.1 kg    Intake/Output Summary (Last 24 hours) at 08/04/2023 1839 Last data filed at 08/04/2023 1800 Gross per 24 hour  Intake 570 ml  Output 8 ml  Net 562 ml   Filed Weights   07/31/23 1532 08/03/23 0900 08/04/23 0839  Weight: 59 kg 73.7 kg 64.1 kg    Examination:   Constitutional: NAD, AAOx3, sitting at edge of bed eating ice cream HEENT: conjunctivae and lids normal, EOMI CV: No cyanosis.   RESP: normal respiratory effort, on RA Neuro: II - XII grossly intact.   Psych: Normal mood and affect.  Appropriate judgement and reason   Data Reviewed: I have personally reviewed labs and imaging studies  Time spent: 35 minutes  Darlin Priestly, MD [facility] If 7PM-7AM, please contact night-coverage 08/04/2023, 6:39 PM

## 2023-08-04 NOTE — Plan of Care (Signed)

## 2023-08-05 DIAGNOSIS — K5720 Diverticulitis of large intestine with perforation and abscess without bleeding: Secondary | ICD-10-CM | POA: Diagnosis not present

## 2023-08-05 LAB — MAGNESIUM: Magnesium: 2.2 mg/dL (ref 1.7–2.4)

## 2023-08-05 LAB — BASIC METABOLIC PANEL
Anion gap: 5 (ref 5–15)
BUN: 34 mg/dL — ABNORMAL HIGH (ref 8–23)
CO2: 21 mmol/L — ABNORMAL LOW (ref 22–32)
Calcium: 8 mg/dL — ABNORMAL LOW (ref 8.9–10.3)
Chloride: 114 mmol/L — ABNORMAL HIGH (ref 98–111)
Creatinine, Ser: 2.25 mg/dL — ABNORMAL HIGH (ref 0.44–1.00)
GFR, Estimated: 22 mL/min — ABNORMAL LOW (ref >60)
Glucose, Bld: 108 mg/dL — ABNORMAL HIGH (ref 70–99)
Potassium: 4.4 mmol/L (ref 3.5–5.1)
Sodium: 140 mmol/L (ref 135–145)

## 2023-08-05 LAB — CBC
HCT: 29.7 % — ABNORMAL LOW (ref 36.0–46.0)
Hemoglobin: 9.2 g/dL — ABNORMAL LOW (ref 12.0–15.0)
MCH: 26.7 pg (ref 26.0–34.0)
MCHC: 31 g/dL (ref 30.0–36.0)
MCV: 86.1 fL (ref 80.0–100.0)
Platelets: 290 10*3/uL (ref 150–400)
RBC: 3.45 MIL/uL — ABNORMAL LOW (ref 3.87–5.11)
RDW: 15.5 % (ref 11.5–15.5)
WBC: 7.7 10*3/uL (ref 4.0–10.5)
nRBC: 0 % (ref 0.0–0.2)

## 2023-08-05 LAB — GLUCOSE, CAPILLARY
Glucose-Capillary: 104 mg/dL — ABNORMAL HIGH (ref 70–99)
Glucose-Capillary: 173 mg/dL — ABNORMAL HIGH (ref 70–99)
Glucose-Capillary: 137 mg/dL — ABNORMAL HIGH (ref 70–99)
Glucose-Capillary: 127 mg/dL — ABNORMAL HIGH (ref 70–99)

## 2023-08-05 MED ORDER — SODIUM CHLORIDE 0.9 % IV SOLN: INTRAVENOUS | Status: DC | PRN

## 2023-08-05 NOTE — Plan of Care (Signed)
  Problem: Education: Goal: Individualized Educational Video(s) 08/05/2023 1613 by Blanchard Mane, RN Outcome: Progressing 08/05/2023 1613 by Blanchard Mane, RN Outcome: Progressing   Problem: Coping: Goal: Ability to adjust to condition or change in health will improve 08/05/2023 1613 by Blanchard Mane, RN Outcome: Progressing 08/05/2023 1613 by Blanchard Mane, RN Outcome: Progressing   Problem: Fluid Volume: Goal: Ability to maintain a balanced intake and output will improve 08/05/2023 1613 by Blanchard Mane, RN Outcome: Progressing 08/05/2023 1613 by Blanchard Mane, RN Outcome: Progressing

## 2023-08-05 NOTE — Progress Notes (Signed)
[facility]ESS [facility] (cpt [phone])  Hospital Day(s): 5.   Interval History: Patient seen and examined, no acute events or new complaints overnight. Patient reports continued soreness at drain site in LLQ. No fever chills, nausea, emesis. WBC remains normal; 7.7K. Hgb to 9.2; improved. Renal function seems to be at her baseline given CKD; sCr - 2.25; UO - unmeasured x4. No significant electrolyte derangements. Percutaneous drain with 15 ccs out recorded; serous with some sediment in tubing. Cx from this grew pan-sensitive E coli. Switched to Unasyn. She is on FLD; Having documented bowel function.  Review of Systems:  Constitutional: denies fever, chills  HEENT: denies cough or congestion  Respiratory: denies any shortness of breath  Cardiovascular: denies chest pain or palpitations  Gastrointestinal: + abdominal pain (LLQ; at drain), denied N/V Musculoskeletal: denies pain, decreased motor or sensation  Vital signs in last 24 hours: [min-max] current  Temp:  [98.4 F (36.9 C)-98.7 F (37.1 C)] 98.7 F (37.1 C) (08/21 0339) Pulse Rate:  [74-79] 79 (08/21 0339) Resp:  [18-20] 20 (08/21 0339) BP: (166-185)/(67-84) 168/72 (08/21 0701) SpO2:  [98 %-100 %] 98 % (08/21 0339) Weight:  [64.1 kg] 64.1 kg (08/20 0839)       Weight: 64.1 kg     Intake/Output last 2 shifts:  08/20 0701 - 08/21 0700 In: 750 [P.O.:540; IV Piggyback:200] Out: 15 [Drains:15]   Physical Exam:  Constitutional: Somewhat somnolent this AM; arouses easily to verbal stimulus but dozes off to sleep during interview HENT: normocephalic without obvious abnormality  Eyes: PERRL, EOM's grossly intact and symmetric  Respiratory: breathing non-labored at rest  Cardiovascular: regular rate and sinus rhythm  Gastrointestinal: soft, tenderness in LLQ near drain site remains unchanged; non-distended. No appreciable rebound/guarding this AM. Drain in LLQ; there is some thicker sediment in tubing which  was stripped; output remains low Musculoskeletal: no edema or wounds, motor and sensation grossly intact, NT    Labs:     Latest Ref Rng & Units 08/05/2023    4:46 AM 08/04/2023    5:45 AM 08/03/2023    4:54 AM  CBC  WBC 4.0 - 10.5 K/uL 7.7  6.6  6.7   Hemoglobin 12.0 - 15.0 g/dL 9.2  9.0  8.4   Hematocrit 36.0 - 46.0 % 29.7  29.5  25.9   Platelets 150 - 400 K/uL 290  254  220       Latest Ref Rng & Units 08/05/2023    4:46 AM 08/04/2023    5:45 AM 08/03/2023    4:54 AM  CMP  Glucose 70 - 99 mg/dL 604  540  981   BUN 8 - 23 mg/dL 34  43  50   Creatinine 0.44 - 1.00 mg/dL 1.91  4.78  2.95   Sodium 135 - 145 mmol/L 140  140  140   Potassium 3.5 - 5.1 mmol/L 4.4  4.5  4.1   Chloride 98 - 111 mmol/L 114  110  110   CO2 22 - 32 mmol/L 21  21  24    Calcium 8.9 - 10.3 mg/dL 8.0  8.1  7.9      Imaging studies: No new pertinent imaging studies   Assessment/Plan: (ICD-10's: K47.92) 77 y.o. female with perforated diverticulitis with abscess and scattered pneumoperitoneum s/p percutaneous drain placement on 08/16.     - Again, fortunately, she seems to be responding reasonably well to conservative measure. She is afebrile and WBC normalized x3 days now. She of course  remains tender in LLQ but this seems more attributed to herr drain itself. No plans to proceed with any emergent intervention (ie: Hartman's). She understands that should she fail to improve or clinically deteriorate in any fashion, we would need to pursue this sooner. If she can recover from this insult, she will benefit from discussion of elective sigmoid colectomy in the interval fashion as outpatient.    - Will trial soft diet - Continue IV Abx (Unasyn); reviewed Cx - would recommend 14 days total given degree of perforation, age, and comorbidities - Monitor abdominal examination; on-going bowel function  - Continue drain; monitor and record output  - Pain control prn; antiemetics prn   - Appreciate medicine assistance  with comorbidities - Okay to mobilize; low threshold to engage therapies if needed   - Discharge Planning: She is doing well clinically without fever, leukocytosis. Will trial advancement of diet today. Pending tolerance an clinical condition, may be okay for discharge home in 24-48 hours. I do not have strong indication to repeat imaging currently.   All of the above findings and recommendations were discussed with the patient, and the medical team, and all of patient's questions were answered to her expressed satisfaction.  -- Lynden Oxford, PA-C [facility] 08/05/2023, 7:32 AM M-F: 7am - 4pm

## 2023-08-05 NOTE — Progress Notes (Signed)
Mobility Specialist - Progress Note   08/05/23 1400  Mobility  Activity Refused mobility     2nd attempt this date. Pt politely declined mobility, requesting to take time for rest as she had just ambulated in room. Will attempt another date/time.    Filiberto Pinks Mobility Specialist 08/05/23, 2:42 PM

## 2023-08-05 NOTE — Plan of Care (Signed)

## 2023-08-05 NOTE — Progress Notes (Signed)
Hospitalist Consult Daily PROGRESS NOTE    Tonya [name]  ZOX:[id number] DOB: 05-Mar-1946 DOA: 07/31/2023 PCP: Marjie Skiff, NP  215A/215A-AA  LOS: 5 days   Brief hospital course: Tonya [name] is a 77 y.o. female with past medical history of HFpEF, stage IV CKD, anemia of chronic disease, history of CVA, hypertension, presenting with perforated diverticulitis with abscess.  Hospitalist service consult requested for medical co-management.  8/21: Clinically improving, diet is being advanced by surgery.  Assessment & Plan:   * Diverticulitis of colon with perforation S/p IR drain placement on 8/16 Acute severe lower abdominal pain with noted Pericolonic 2.6 x 1.8 x 3.4 cm fluid collection along the anti mesenteric aspect of the distal descending colon most concerning for an abscess on imaging.  Started on zosyn, de-escalated to Unasyn by surgery.  Cultures with pansensitive E. Coli. Plan: --cont Unasyn --cont drain management --advance to Full liquid diet, per surgery  Chronic heart failure with preserved ejection fraction (HFpEF) (HCC) 2D echo December 2020 with EF of 60 to 65% and grade 1 diastolic dysfunction Appears mildly dry to euvolemic on exam --s/p MIVF Plan: --hold home torsemide --resume ARB as irbesartan 75 mg daily  --cont coreg   Anemia of chronic disease Baseline hgb 10-11 Monitor   History of CVA (cerebrovascular accident) Remote history of CVA in December 2020 --cont statin  CKD (chronic kidney disease) stage 4, GFR 15-29 ml/min (HCC) Creatinine 2.2 today with baseline creatinine around 2.2-2.8.  Hyperlipidemia associated with type 2 diabetes mellitus (HCC) Cont statin   HTN --cont coreg --hold home torsemide --resume ARB as irbesartan 75 mg daily   Distal stool impaction --cont Dulcolax supp  Hypoglycemia --due to SSI and reduce oral intake --s/p D5 infusion --BG trending up now that pt is on full liquid diet Plan: --resume SSI at  sensitive scale   DVT prophylaxis: Heparin SQ Code Status: Full code  Family Communication:  Level of care: Med-Surg Dispo:   The patient is from: home Anticipated d/c is to: home Anticipated d/c date is: per primary team   Subjective and Interval History:  Patient was seen and examined today.  Improving pain but continued to have left lower quadrant tenderness.  Tolerating soft diet.  Objective: Vitals:   08/05/23 0339 08/05/23 0648 08/05/23 0701 08/05/23 0915  BP: (!) 181/67 (!) 185/84 (!) 168/72 (!) 161/92  Pulse: 79   88  Resp: 20   20  Temp: 98.7 F (37.1 C)   98.4 F (36.9 C)  TempSrc: Oral   Oral  SpO2: 98%   98%  Weight:        Intake/Output Summary (Last 24 hours) at 08/05/2023 1437 Last data filed at 08/05/2023 8119 Gross per 24 hour  Intake 850 ml  Output 10 ml  Net 840 ml   Filed Weights   07/31/23 1532 08/03/23 0900 08/04/23 0839  Weight: 59 kg 73.7 kg 64.1 kg    Examination:   General.  Frail elderly lady, in no acute distress. Pulmonary.  Lungs clear bilaterally, normal respiratory effort. CV.  Regular rate and rhythm, no JVD, rub or murmur. Abdomen.  Soft, mild LLQ tenderness, nondistended, BS positive.  LLQ drain in place with serosanguineous discharge CNS.  Alert and oriented .  No focal neurologic deficit. Extremities.  No edema, no cyanosis, pulses intact and symmetrical. Psychiatry.  Judgment and insight appears normal.   Data Reviewed: I have personally reviewed labs and imaging studies  Time spent: 40 minutes  This  record has been created using Conservation officer, historic buildings. Errors have been sought and corrected,but may not always be located. Such creation errors do not reflect on the standard of care.   Arnetha Courser, MD [facility] If 7PM-7AM, please contact night-coverage 08/05/2023, 2:37 PM

## 2023-08-05 NOTE — Progress Notes (Signed)
Referring Physician(s): Lynden Oxford, PA-C  Supervising Physician: Pernell Dupre  Patient Status:  [facility] - In-pt  Chief Complaint: Perforated diverticulitis s/p drain placement in IR 07/31/23  Subjective: Patient sitting up in bed preparing to eat breakfast. She denies any significant pain or discomfort. She does not know when she will be discharged home.   Allergies: Atorvastatin, Gabapentin, Pravastatin, Pregabalin, and Trulicity [dulaglutide]  Medications: Prior to Admission medications   Medication Sig Start Date End Date Taking? Authorizing Provider  acetaminophen (TYLENOL) 650 MG CR tablet Take 650 mg by mouth every 8 (eight) hours as needed for pain.   Yes [provider]  aspirin 81 MG tablet Take 81 mg by mouth daily.   Yes [provider]  carvedilol (COREG) 3.125 MG tablet Take 1 tablet (3.125 mg total) by mouth 2 (two) times daily with a meal. PLEASE CALL OFFICE TO SCHEDULE APPOINTMENT PRIOR TO NEXT REFILL 06/15/23  Yes Gollan, Tollie Pizza, MD  diclofenac Sodium (VOLTAREN) 1 % GEL Apply 4 g topically 4 (four) times daily. 07/06/23  Yes Pearley, Sherran Needs, NP  docusate sodium (COLACE) 100 MG capsule Take 100 mg by mouth daily as needed for mild constipation.   Yes [provider]  gabapentin (NEURONTIN) 100 MG capsule Take 1 capsule (100 mg total) by mouth at bedtime. 02/25/23  Yes Cannady, [name] T, NP  insulin lispro (HUMALOG) 100 UNIT/ML injection Inject 8 Units into the skin 3 (three) times daily before meals.   Yes [provider]  meclizine (ANTIVERT) 12.5 MG tablet Take 12.5 mg by mouth 3 (three) times daily. 12/22/22  Yes [provider]  methocarbamol (ROBAXIN) 500 MG tablet Take 1 tablet (500 mg total) by mouth daily as needed for muscle spasms. 10/23/22  Yes Edward Jolly, MD  naloxone [facility]) nasal spray 4 mg/0.1 mL  08/27/22  Yes [provider]  NOVOFINE 32G X 6 MM MISC USE AS DIRECTED THREE TIMES DAILY  WITH HUMALOG 06/19/20  Yes Cannady, [name] T, NP  olmesartan (BENICAR) 40 MG tablet Take 1 tablet (40 mg total) by mouth daily. Patient taking differently: Take 40 mg by mouth every morning. 08/07/20  Yes Cannady, [name] T, NP  pantoprazole (PROTONIX) 40 MG tablet Take 1 tablet (40 mg total) by mouth daily. 01/28/23  Yes Pabon, Diego F, MD  pioglitazone (ACTOS) 30 MG tablet Take 30 mg by mouth daily.   Yes [provider]  rosuvastatin (CRESTOR) 5 MG tablet Take 1 tablet (5 mg total) by mouth daily. PLEASE CALL OFFICE TO SCHEDULE APPOINTMENT PRIOR TO NEXT REFILL 06/15/23  Yes Gollan, Tollie Pizza, MD  torsemide (DEMADEX) 20 MG tablet Take 2 tablets (40 mg total) by mouth daily. Patient taking differently: Take 40 mg by mouth every morning. 07/01/22  Yes Gollan, Tollie Pizza, MD  vitamin B-12 (CYANOCOBALAMIN) 100 MCG tablet Take 100 mcg by mouth daily.   Yes [provider]  Vitamin D, Cholecalciferol, 50 MCG (2000 UT) CAPS Take 2,000 Units by mouth daily.   Yes [provider]     Vital Signs: BP (!) 168/72   Pulse 79   Temp 98.7 F (37.1 C) (Oral)   Resp 20   Wt 141 lb 6.4 oz (64.1 kg)   LMP  (LMP Unknown)   SpO2 98%   BMI 22.15 kg/m   Physical Exam Constitutional:      General: She is not in acute distress.    Appearance: She is not ill-appearing.  Pulmonary:  Effort: Pulmonary effort is normal.  Abdominal:     Palpations: Abdomen is soft.     Tenderness: There is abdominal tenderness.     Comments: LLQ drain to suction. Dressing is clean, dry and intact. Site is mildly tender to palpation. Drain easily flushed/aspirated. Approximately 20 ml of purulent fluid in bulb.   Skin:    General: Skin is warm and dry.  Neurological:     Mental Status: She is alert and oriented to person, place, and time.  Psychiatric:        Mood and Affect: Mood normal.        Behavior: Behavior normal.        Thought Content: Thought content normal.        Judgment: Judgment  normal.     Imaging: No results found.  Labs:  CBC: Recent Labs    08/02/23 0403 08/03/23 0454 08/04/23 0545 08/05/23 0446  WBC 7.4 6.7 6.6 7.7  HGB 8.3* 8.4* 9.0* 9.2*  HCT 26.1* 25.9* 29.5* 29.7*  PLT 198 220 254 290    COAGS: No results for input(s): "INR", "APTT" in the last 8760 hours.  BMP: Recent Labs    08/02/23 0403 08/03/23 0454 08/04/23 0545 08/05/23 0446  NA 139 140 140 140  K 4.1 4.1 4.5 4.4  CL 109 110 110 114*  CO2 21* 24 21* 21*  GLUCOSE 83 100* 128* 108*  BUN 57* 50* 43* 34*  CALCIUM 7.7* 7.9* 8.1* 8.0*  CREATININE 2.60* 2.71* 2.51* 2.25*  GFRNONAA 19* 18* 19* 22*    LIVER FUNCTION TESTS: Recent Labs    12/22/22 1600 07/31/23 1044  BILITOT <0.2 0.4  AST 14 18  ALT 7 14  ALKPHOS 62 64  PROT 5.5* 6.7  ALBUMIN 3.5* 3.4*    Assessment and Plan:  Perforated diverticulitis s/p drain placement in IR 07/31/23 She is afebrile and without leukocytosis. Outpatient orders for follow up with IR have been placed.   Drain Location: LLQ Size: Fr size: 10 Fr Date of placement: 07/31/23 Currently to: Drain collection device: suction bulb 24 hour output:  Output by Drain (mL) 08/03/23 0700 - 08/03/23 1459 08/03/23 1500 - 08/03/23 2259 08/03/23 2300 - 08/04/23 0659 08/04/23 0700 - 08/04/23 1459 08/04/23 1500 - 08/04/23 2259 08/04/23 2300 - 08/05/23 0659 08/05/23 0700 - 08/05/23 0823  Closed System Drain LLQ  10 3 5  10 20     Interval imaging/drain manipulation:  None  Current examination: Flushes/aspirates easily.  Insertion site unremarkable. Suture and stat lock in place. Dressed appropriately.   Plan: Continue TID flushes with 5 cc NS. Record output Q shift. Dressing changes QD or PRN if soiled.  Call IR APP or on call IR MD if difficulty flushing or sudden change in drain output.  Repeat imaging/possible drain injection once output < 10 mL/QD (excluding flush material). Consideration for drain removal if output is < 10 mL/QD  (excluding flush material), pending discussion with the providing surgical service.  Discharge planning: Typically patient will follow up with IR clinic 10-14 days post d/c for repeat imaging/possible drain injection. IR scheduler will contact patient with date/time of appointment. Patient will need to flush drain QD with 5 cc NS, record output QD, dressing changes every 2-3 days or earlier if soiled.   IR will continue to follow - please call with questions or concerns.  Electronically Signed: Alwyn Ren, AGACNP-BC [phone] 08/05/2023, 8:19 AM   I spent a total of 15 Minutes at the the patient's bedside  AND on the patient's hospital floor or unit, greater than 50% of which was counseling/coordinating care for diverticular abscess drain.

## 2023-08-06 DIAGNOSIS — K5720 Diverticulitis of large intestine with perforation and abscess without bleeding: Secondary | ICD-10-CM | POA: Diagnosis not present

## 2023-08-06 LAB — GLUCOSE, CAPILLARY
Glucose-Capillary: 81 mg/dL (ref 70–99)
Glucose-Capillary: 133 mg/dL — ABNORMAL HIGH (ref 70–99)
Glucose-Capillary: 221 mg/dL — ABNORMAL HIGH (ref 70–99)

## 2023-08-06 MED ORDER — POLYETHYLENE GLYCOL 3350 17 G PO PACK
17.0000 g | PACK | Freq: Two times a day (BID) | ORAL | Status: DC
  Administered 2023-08-06: 17 g via ORAL
  Filled 2023-08-06: qty 1

## 2023-08-06 MED ORDER — IRBESARTAN 150 MG PO TABS
150.0000 mg | ORAL_TABLET | Freq: Every day | ORAL | Status: DC
  Administered 2023-08-06: 150 mg via ORAL
  Filled 2023-08-06: qty 1

## 2023-08-06 MED ORDER — HYDROCODONE-ACETAMINOPHEN 5-325 MG PO TABS: 1.0000 | ORAL_TABLET | ORAL | 0 refills | Status: DC | PRN

## 2023-08-06 MED ORDER — AMOXICILLIN-POT CLAVULANATE 500-125 MG PO TABS
1.0000 | ORAL_TABLET | Freq: Two times a day (BID) | ORAL | Status: DC
  Administered 2023-08-06: 1 via ORAL
  Filled 2023-08-06: qty 1

## 2023-08-06 MED ORDER — AMOXICILLIN-POT CLAVULANATE 500-125 MG PO TABS: 1.0000 | ORAL_TABLET | Freq: Two times a day (BID) | ORAL | 0 refills | Status: DC

## 2023-08-06 MED ORDER — POLYETHYLENE GLYCOL 3350 17 G PO PACK: 17.0000 g | PACK | Freq: Two times a day (BID) | ORAL | 0 refills | Status: DC

## 2023-08-06 MED ORDER — SIMETHICONE 80 MG PO CHEW: 80.0000 mg | CHEWABLE_TABLET | Freq: Four times a day (QID) | ORAL | 0 refills | Status: DC | PRN

## 2023-08-06 NOTE — Discharge Instructions (Addendum)
Drain care with twice a day irrigation with normal saline, and maintenance of suction.  Patient will need to flush drain daily with 5 cc NS, record output QD, dressing changes every 2-3 days or earlier if soiled.

## 2023-08-06 NOTE — Discharge Summary (Signed)
Physician Discharge Summary  Patient ID: Tonya [name] MRN: [id number] DOB/AGE: 1946/06/30 77 y.o.  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  Was kept on IV antibiotics and cultures followed.  She eventually was started on clear liquids and advance to a soft diet which she tolerated well.  She did have some bowel movements with Dulcolax suppositories for assist.  She had excellent improvement/near resolution of left lower quadrant pain/tenderness.  She became afebrile shortly after admission.  Consults:  Hospitalist  Significant Diagnostic Studies: radiology: IR for percutaneous drain placement.  Treatments: Drainage of abscess with IV antibiotics and bowel rest.  Discharge Exam: Blood pressure (!) 173/74, pulse 75, temperature 98.3 F (36.8 C), temperature source Oral, resp. rate 16, weight 64.1 kg, SpO2 99%. General appearance: alert, cooperative, and no distress Resp: clear to auscultation bilaterally GI: soft, non-tender; bowel sounds normal; no masses,  no organomegaly and left lower quadrant drainage clearing.  Disposition: Discharge disposition: 01-Home or Self Care       Discharge Instructions     Call MD for:  persistant nausea and vomiting   Complete by: As directed    Call MD for:  redness, tenderness, or signs of infection (pain, swelling,  redness, odor or green/yellow discharge around incision site)   Complete by: As directed    Call MD for:  severe uncontrolled pain   Complete by: As directed    Discharge instructions   Complete by: As directed    May resume aspirin or other anticoagulants after 48 hours.   Driving Restrictions   Complete by: As directed    No driving until cleared after follow-up appointment.  Is not advised to drive while taking narcotic pain medications or in significant pain.   Increase activity slowly   Complete by: As directed    Lifting restrictions   Complete by: As directed    Strongly advised against any form of lifting greater than 15 pounds over the next 4 to 6 weeks.  This involves pushing/pulling movements as well.  After 4 weeks when may gradually engage in more activities remaining aware of any new pain/tenderness elicited, and avoiding those for the full duration of 6 weeks.  Walking is encouraged.  Climbing stairs with caution.      Allergies as of 08/06/2023       Reactions   Atorvastatin    Myalgias   Gabapentin Other (See Comments)   Speech impairment    Pravastatin    Myalgias   Pregabalin    Speech impairment   Trulicity [dulaglutide] Hives        Medication List     TAKE these medications    acetaminophen 650 MG CR tablet Commonly known as: TYLENOL Take 650 mg by mouth every 8 (eight) hours as needed for pain.   amoxicillin-clavulanate 500-125 MG tablet Commonly known as: AUGMENTIN Take 1 tablet by mouth every 12 (twelve) hours.   aspirin 81 MG  tablet Take 81 mg by mouth daily.   carvedilol 3.125 MG tablet Commonly known as: COREG Take 1 tablet (3.125 mg total) by mouth 2 (two) times daily with a meal. PLEASE CALL OFFICE TO SCHEDULE APPOINTMENT PRIOR TO NEXT REFILL   diclofenac Sodium 1 % Gel Commonly known as: Voltaren Apply 4 g topically 4 (four) times daily.   docusate sodium 100 MG capsule Commonly known as: COLACE Take 100 mg by mouth daily as  needed for mild constipation.   gabapentin 100 MG capsule Commonly known as: NEURONTIN Take 1 capsule (100 mg total) by mouth at bedtime.   HYDROcodone-acetaminophen 5-325 MG tablet Commonly known as: NORCO/VICODIN Take 1-2 tablets by mouth every 4 (four) hours as needed for moderate pain or severe pain.   insulin lispro 100 UNIT/ML injection Commonly known as: HUMALOG Inject 8 Units into the skin 3 (three) times daily before meals.   meclizine 12.5 MG tablet Commonly known as: ANTIVERT Take 12.5 mg by mouth 3 (three) times daily.   methocarbamol 500 MG tablet Commonly known as: ROBAXIN Take 1 tablet (500 mg total) by mouth daily as needed for muscle spasms.   naloxone 4 MG/0.1ML Liqd nasal spray kit Commonly known as: NARCAN   NovoFine 32G X 6 MM Misc Generic drug: Insulin Pen Needle USE AS DIRECTED THREE TIMES DAILY WITH HUMALOG   olmesartan 40 MG tablet Commonly known as: BENICAR Take 1 tablet (40 mg total) by mouth daily. What changed: when to take this   pantoprazole 40 MG tablet Commonly known as: Protonix Take 1 tablet (40 mg total) by mouth daily.   pioglitazone 30 MG tablet Commonly known as: ACTOS Take 30 mg by mouth daily.   polyethylene glycol 17 g packet Commonly known as: MIRALAX / GLYCOLAX Take 17 g by mouth 2 (two) times daily.   rosuvastatin 5 MG tablet Commonly known as: CRESTOR Take 1 tablet (5 mg total) by mouth daily. PLEASE CALL OFFICE TO SCHEDULE APPOINTMENT PRIOR TO NEXT REFILL   simethicone 80 MG chewable tablet Commonly known as: MYLICON Chew 1 tablet (80 mg total) by mouth 4 (four) times daily as needed for flatulence.   torsemide 20 MG tablet Commonly known as: DEMADEX Take 2 tablets (40 mg total) by mouth daily. What changed: when to take this   vitamin B-12 100 MCG tablet Commonly known as: CYANOCOBALAMIN Take 100 mcg by mouth daily.   vitamin D3 50 MCG (2000 UT) Caps Take 2,000 Units by mouth daily.        Follow-up  Information     [facility] Follow up.   Why: Please follow up with [facility] in 10-14 days. A scheduler from our office will call you with a date/time of your appointment. Please call our office with any questions/concerns prior to your visit. Contact information: [address] [place] [address] [phone]         Campbell Lerner, MD. Go on 08/13/2023.   Specialty: General Surgery Why: Go at 10:15am. Contact information: [address] [place] [address] [phone]                 Signed: Campbell Lerner, M.D., [facility] 08/06/2023, 9:55 AM

## 2023-08-06 NOTE — Progress Notes (Signed)
Hospitalist Consult Daily PROGRESS NOTE    Tonya [name]  ZOX:[id number] DOB: 05-04-1946 DOA: 07/31/2023 PCP: Marjie Skiff, NP  215A/215A-AA  LOS: 6 days   Brief hospital course: Tonya [name] is a 77 y.o. female with past medical history of HFpEF, stage IV CKD, anemia of chronic disease, history of CVA, hypertension, presenting with perforated diverticulitis with abscess.  Hospitalist service consult requested for medical co-management.  8/21: Clinically improving, diet is being advanced by surgery.  8/22: Blood pressure remained elevated so increasing the dose of irbesartan.  Surgery likely discharge her today as she is tolerating diet.  She will be going with drain in place and follow-up with them as outpatient.  Assessment & Plan:   * Diverticulitis of colon with perforation S/p IR drain placement on 8/16 Acute severe lower abdominal pain with noted Pericolonic 2.6 x 1.8 x 3.4 cm fluid collection along the anti mesenteric aspect of the distal descending colon most concerning for an abscess on imaging.  Started on zosyn, de-escalated to Unasyn by surgery.  Cultures with pansensitive E. Coli. Plan: --cont Unasyn --cont drain management --advance to Full liquid diet, per surgery  Chronic heart failure with preserved ejection fraction (HFpEF) (HCC) 2D echo December 2020 with EF of 60 to 65% and grade 1 diastolic dysfunction Appears mildly dry to euvolemic on exam --s/p MIVF Plan: --hold home torsemide --resume ARB as irbesartan 75 mg daily  --cont coreg   Anemia of chronic disease Baseline hgb 10-11 Monitor   History of CVA (cerebrovascular accident) Remote history of CVA in December 2020 --cont statin  CKD (chronic kidney disease) stage 4, GFR 15-29 ml/min (HCC) Creatinine 2.2 today with baseline creatinine around 2.2-2.8.  Hyperlipidemia associated with type 2 diabetes mellitus (HCC) Cont statin   HTN.  Blood pressure remained elevated --cont coreg --hold  home torsemide -- Increasing the dose of irbesartan.  Distal stool impaction --cont Dulcolax supp  Hypoglycemia --due to SSI and reduce oral intake --s/p D5 infusion --BG trending up now that pt is on full liquid diet Plan: --resume SSI at sensitive scale   DVT prophylaxis: Heparin SQ Code Status: Full code  Family Communication:  Level of care: Med-Surg Dispo:   The patient is from: home Anticipated d/c is to: home Anticipated d/c date is: per primary team   Subjective and Interval History:  Patient was seen and examined today.  Mild LLQ tenderness, no pain.  Tolerating advancement in diet well.  No nausea or vomiting.  Objective: Vitals:   08/05/23 1629 08/05/23 2002 08/06/23 0253 08/06/23 0916  BP: (!) 139/59 (!) 153/63 (!) 150/67 (!) 173/74  Pulse: 87 85 72 75  Resp: 18 18 16 16   Temp: 98.1 F (36.7 C) 98.2 F (36.8 C) 98.5 F (36.9 C) 98.3 F (36.8 C)  TempSrc: Oral   Oral  SpO2: 98% 100% 97% 99%  Weight:        Intake/Output Summary (Last 24 hours) at 08/06/2023 1301 Last data filed at 08/06/2023 0500 Gross per 24 hour  Intake 10 ml  Output 15 ml  Net -5 ml   Filed Weights   07/31/23 1532 08/03/23 0900 08/04/23 0839  Weight: 59 kg 73.7 kg 64.1 kg    Examination:   General.  Frail elderly lady, in no acute distress. Pulmonary.  Lungs clear bilaterally, normal respiratory effort. CV.  Regular rate and rhythm, no JVD, rub or murmur. Abdomen.  Soft, mild LLQ tenderness with drain in place, nondistended, BS positive. CNS.  Alert and oriented .  No focal neurologic deficit. Extremities.  No edema, no cyanosis, pulses intact and symmetrical. Psychiatry.  Judgment and insight appears normal. .   Data Reviewed: I have personally reviewed labs and imaging studies  Time spent: 39 minutes  This record has been created using Conservation officer, historic buildings. Errors have been sought and corrected,but may not always be located. Such creation errors do not  reflect on the standard of care.   Arnetha Courser, MD [facility] If 7PM-7AM, please contact night-coverage 08/06/2023, 1:01 PM

## 2023-08-06 NOTE — Plan of Care (Signed)
  Problem: Education: Goal: Ability to describe self-care measures that may prevent or decrease complications (Diabetes Survival Skills Education) will improve Outcome: Progressing   Problem: Education: Goal: Knowledge of General Education information will improve Description: Including pain rating scale, medication(s)/side effects and non-pharmacologic comfort measures Outcome: Progressing   Problem: Tissue Perfusion: Goal: Adequacy of tissue perfusion will improve Outcome: Progressing   Problem: Skin Integrity: Goal: Risk for impaired skin integrity will decrease Outcome: Progressing   Problem: Nutritional: Goal: Progress toward achieving an optimal weight will improve Outcome: Progressing

## 2023-08-06 NOTE — Care Management Important Message (Signed)
Important Message  Patient Details  Name: LARETA LEGGITT MRN: [id number] Date of Birth: [birth year]   Medicare Important Message Given:  Yes     [name] 08/06/2023, 1:31 PM

## 2023-08-06 NOTE — Progress Notes (Signed)
Mobility Specialist - Progress Note   08/06/23 1000  Mobility  Activity Ambulated with assistance in hallway;Ambulated with assistance to bathroom  Level of Assistance Standby assist, set-up cues, supervision of patient - no hands on  Assistive Device Front wheel walker  Distance Ambulated (ft) 95 ft  Activity Response Tolerated well  $Mobility charge 1 Mobility     Pt lying in bed upon arrival, utilizing RA. Pt agreeable to activity. Completed bed mobility and STS modI. Pt voiced pain at incision site and gassy pains under L breast. Ambulated in room/hallway with supervision. Denied dizziness/SOB. Pt returned to bed with alarm set, needs in reach. RN notified.   Filiberto Pinks Mobility Specialist 08/06/23, 10:12 AM

## 2023-08-07 ENCOUNTER — Telehealth: Payer: Self-pay

## 2023-08-07 ENCOUNTER — Other Ambulatory Visit: Payer: Self-pay | Admitting: Surgery

## 2023-08-07 DIAGNOSIS — K5720 Diverticulitis of large intestine with perforation and abscess without bleeding: Secondary | ICD-10-CM

## 2023-08-07 NOTE — Transitions of Care (Post Inpatient/ED Visit) (Signed)
   08/07/2023  Name: CLATIE WASKO MRN: [id number] DOB: 10/06/1946  Today's TOC FU Call Status: Today's TOC FU Call Status:: Unsuccessful Call (1st Attempt) Unsuccessful Call (1st Attempt) Date: 08/07/23  Attempted to reach the patient regarding the most recent Inpatient/ED visit.  Follow Up Plan: Additional outreach attempts will be made to reach the patient to complete the Transitions of Care (Post Inpatient/ED visit) call.   Jodelle Gross RN, BSN, CCM [facility] RN Care Coordinator/ Transitions of Care Direct Dial: [phone]  Fax: [phone]

## 2023-08-10 ENCOUNTER — Telehealth: Payer: Self-pay

## 2023-08-10 NOTE — Transitions of Care (Post Inpatient/ED Visit) (Signed)
   08/10/2023  Name: Tonya [name] MRN: [id number] DOB: May 13, 1946  Today's TOC FU Call Status: Today's TOC FU Call Status:: Unsuccessful Call (2nd Attempt) Unsuccessful Call (2nd Attempt) Date: 08/10/23  Attempted to reach the patient regarding the most recent Inpatient/ED visit.  Follow Up Plan: Additional outreach attempts will be made to reach the patient to complete the Transitions of Care (Post Inpatient/ED visit) call.   Jodelle Gross RN, BSN, CCM [facility] RN Care Coordinator/ Transitions of Care Direct Dial: [phone]  Fax: [phone]

## 2023-08-10 NOTE — Transitions of Care (Post Inpatient/ED Visit) (Signed)
08/10/2023  Name: Tonya [name] MRN: [id number] DOB: February 16, 1946  Today's TOC FU Call Status: Today's TOC FU Call Status:: Successful TOC FU Call Completed TOC FU Call Complete Date: 08/10/23 Patient's Name and Date of Birth confirmed.  Transition Care Management Follow-up Telephone Call Date of Discharge: 08/06/23 Discharge Facility: [facility]) Type of Discharge: Inpatient Admission Primary Inpatient Discharge Diagnosis:: Diverticulitis with Perforation How have you been since you were released from the hospital?: Better (Patient notes she is feeling okay, drain functioning with little output.) Any questions or concerns?: No  Items Reviewed: Did you receive and understand the discharge instructions provided?: Yes Medications obtained,verified, and reconciled?: Yes (Medications Reviewed) Any new allergies since your discharge?: No Dietary orders reviewed?: Yes Type of Diet Ordered:: carb controlled Do you have support at home?: Yes People in Home: spouse Name of Support/Comfort Primary Source: Joe  Medications Reviewed Today: Medications Reviewed Today     Reviewed by Jodelle Gross, RN (Case Manager) on 08/10/23 at [phone]  Med List Status: <None>   Medication Order Taking? Sig Documenting Provider Last Dose Status Informant  acetaminophen (TYLENOL) 650 MG CR tablet [id number] Yes Take 650 mg by mouth every 8 (eight) hours as needed for pain. [provider] Taking Active   amoxicillin-clavulanate (AUGMENTIN) 500-125 MG tablet [id number] Yes Take 1 tablet by mouth every 12 (twelve) hours. Campbell Lerner, MD Taking Active   aspirin 81 MG tablet [id number] Yes Take 81 mg by mouth daily. [provider] Taking Active Self  carvedilol (COREG) 3.125 MG tablet [id number] Yes Take 1 tablet (3.125 mg total) by mouth 2 (two) times daily with a meal. PLEASE CALL OFFICE TO SCHEDULE APPOINTMENT PRIOR TO NEXT REFILL Antonieta Iba, MD Taking  Active   diclofenac Sodium (VOLTAREN) 1 % GEL [id number] Yes Apply 4 g topically 4 (four) times daily. Weber Cooks, NP Taking Active   docusate sodium (COLACE) 100 MG capsule [id number] Yes Take 100 mg by mouth daily as needed for mild constipation. [provider] Taking Active   gabapentin (NEURONTIN) 100 MG capsule [id number] Yes Take 1 capsule (100 mg total) by mouth at bedtime. Aura Dials T, NP Taking Active   HYDROcodone-acetaminophen (NORCO/VICODIN) 5-325 MG tablet [id number] Yes Take 1-2 tablets by mouth every 4 (four) hours as needed for moderate pain or severe pain. Campbell Lerner, MD Taking Active   insulin lispro (HUMALOG) 100 UNIT/ML injection [id number] Yes Inject 8 Units into the skin 3 (three) times daily before meals. [provider] Taking Active   meclizine (ANTIVERT) 12.5 MG tablet [id number] Yes Take 12.5 mg by mouth 3 (three) times daily. [provider] Taking Active   methocarbamol (ROBAXIN) 500 MG tablet [id number] Yes Take 1 tablet (500 mg total) by mouth daily as needed for muscle spasms. Edward Jolly, MD Taking Active   naloxone [facility]) nasal spray 4 mg/0.1 mL [id number] Yes  [provider] Taking Active   NOVOFINE 32G X 6 MM MISC [id number] Yes USE AS DIRECTED THREE TIMES DAILY WITH HUMALOG Cannady, [name] T, NP Taking Active   olmesartan (BENICAR) 40 MG tablet [id number] Yes Take 1 tablet (40 mg total) by mouth daily.  Patient taking differently: Take 40 mg by mouth every morning.   Aura Dials T, NP Taking Active Self  pantoprazole (PROTONIX) 40 MG tablet [id number] Yes Take 1 tablet (40 mg total) by mouth daily. Leafy Ro, MD Taking Active   pioglitazone (ACTOS) 30 MG tablet [id number] Yes Take 30 mg by  mouth daily. [provider] Taking Active   polyethylene glycol (MIRALAX / GLYCOLAX) 17 g packet [id number] Yes Take 17 g by mouth 2 (two) times daily. Campbell Lerner, MD Taking Active   rosuvastatin  (CRESTOR) 5 MG tablet [id number] Yes Take 1 tablet (5 mg total) by mouth daily. PLEASE CALL OFFICE TO SCHEDULE APPOINTMENT PRIOR TO NEXT REFILL Antonieta Iba, MD Taking Active   simethicone (MYLICON) 80 MG chewable tablet [id number] Yes Chew 1 tablet (80 mg total) by mouth 4 (four) times daily as needed for flatulence. Campbell Lerner, MD Taking Active   torsemide [facility]) 20 MG tablet [id number] Yes Take 2 tablets (40 mg total) by mouth daily.  Patient taking differently: Take 40 mg by mouth every morning.   Antonieta Iba, MD Taking Active Self  vitamin B-12 (CYANOCOBALAMIN) 100 MCG tablet [id number] Yes Take 100 mcg by mouth daily. [provider] Taking Active   Vitamin D, Cholecalciferol, 50 MCG (2000 UT) CAPS [id number] Yes Take 2,000 Units by mouth daily. [provider] Taking Active   Med List Note Valerie Salts, RN 04/18/20 1437): MR 06-17-2020 UDS 04-18-2020 Med agreement signed 04-18-2020            Home Care and Equipment/Supplies: Were Home Health Services Ordered?: No Any new equipment or medical supplies ordered?: No  Functional Questionnaire: Do you need assistance with bathing/showering or dressing?: No Do you need assistance with meal preparation?: No Do you need assistance with eating?: No Do you have difficulty maintaining continence: No Do you need assistance with getting out of bed/getting out of a chair/moving?: No Do you have difficulty managing or taking your medications?: No  Follow up appointments reviewed: PCP Follow-up appointment confirmed?: Yes Date of PCP follow-up appointment?: 08/13/23 Follow-up Provider: Campbell Lerner, NP Specialist Hospital Follow-up appointment confirmed?: Yes Date of Specialist follow-up appointment?: 08/13/23 Follow-Up Specialty Provider:: Sarina Ser Do you need transportation to your follow-up appointment?: No Do you understand care options if your condition(s) worsen?: Yes-patient verbalized  understanding  SDOH Interventions Today    Flowsheet Row Most Recent Value  SDOH Interventions   Food Insecurity Interventions Intervention Not Indicated  Housing Interventions Intervention Not Indicated  Transportation Interventions Intervention Not Indicated      Interventions Today    Flowsheet Row Most Recent Value  Chronic Disease   Chronic disease during today's visit Diabetes       TOC Interventions Today    Flowsheet Row Most Recent Value  TOC Interventions   TOC Interventions Discussed/Reviewed TOC Interventions Discussed, TOC Interventions Reviewed, Post op wound/incision care  [Drain care]       Jodelle Gross RN, BSN, CCM [facility] RN Care Coordinator/ Transitions of Care Direct Dial: [phone]  Fax: [phone]

## 2023-08-11 ENCOUNTER — Inpatient Hospital Stay: Payer: Medicare Other

## 2023-08-11 VITALS — BP 146/71

## 2023-08-11 DIAGNOSIS — D631 Anemia in chronic kidney disease: Secondary | ICD-10-CM | POA: Diagnosis not present

## 2023-08-11 DIAGNOSIS — D638 Anemia in other chronic diseases classified elsewhere: Secondary | ICD-10-CM

## 2023-08-11 DIAGNOSIS — N184 Chronic kidney disease, stage 4 (severe): Secondary | ICD-10-CM | POA: Diagnosis not present

## 2023-08-11 LAB — HEMOGLOBIN AND HEMATOCRIT, BLOOD
HCT: 29.2 % — ABNORMAL LOW (ref 36.0–46.0)
Hemoglobin: 9.1 g/dL — ABNORMAL LOW (ref 12.0–15.0)

## 2023-08-11 MED ORDER — EPOETIN ALFA-EPBX 10000 UNIT/ML IJ SOLN
10000.0000 [IU] | Freq: Once | INTRAMUSCULAR | Status: AC
  Administered 2023-08-11: 10000 [IU] via SUBCUTANEOUS
  Filled 2023-08-11: qty 1

## 2023-08-12 ENCOUNTER — Encounter: Payer: Self-pay | Admitting: Pulmonary Disease

## 2023-08-12 ENCOUNTER — Ambulatory Visit: Payer: Medicare Other | Admitting: Pulmonary Disease

## 2023-08-12 VITALS — BP 134/80 | HR 80 | Temp 98.3°F | Ht 67.0 in | Wt 139.0 lb

## 2023-08-12 DIAGNOSIS — Z9889 Other specified postprocedural states: Secondary | ICD-10-CM

## 2023-08-12 DIAGNOSIS — K5720 Diverticulitis of large intestine with perforation and abscess without bleeding: Secondary | ICD-10-CM | POA: Diagnosis not present

## 2023-08-12 DIAGNOSIS — Z8719 Personal history of other diseases of the digestive system: Secondary | ICD-10-CM | POA: Diagnosis not present

## 2023-08-12 DIAGNOSIS — Z862 Personal history of diseases of the blood and blood-forming organs and certain disorders involving the immune mechanism: Secondary | ICD-10-CM | POA: Diagnosis not present

## 2023-08-12 NOTE — Patient Instructions (Signed)
We will follow-up as needed.  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  This is good news.

## 2023-08-12 NOTE — Progress Notes (Signed)
Subjective:    Patient ID: Tonya [name], female    DOB: [birth year], 77 y.o.   MRN: [id number]  Patient Care Team: Marjie Skiff, NP as PCP - General (Nurse Practitioner) Antonieta Iba, MD as PCP - Cardiology (Cardiology) Solum, Marlana Salvage, MD as Physician Assistant (Endocrinology) Lamont Dowdy, MD as Consulting Physician (Nephrology) Antonieta Iba, MD as Consulting Physician (Cardiology) [facility], [facility] (Optometry) Rickard Patience, MD as Consulting Physician (Oncology) Marlowe Sax, RN as Case Manager (General Practice)  Chief Complaint  Patient presents with   Follow-up    No SOB, wheezing, or cough.     HPI Tonya [name] is a 77 year old lifelong never smoker with a prior history of sarcoidosis who presents for follow-up from her last visit of 27 June 2022.  Recall that at that time at that time she was referred by speech pathology because of prior history of sarcoidosis and the issues that the patient had with swallowing for which the speech pathologist was evaluating her.  The question was whether this was related to her prior sarcoidosis.  Her initial evaluation here was on 28 March 2022.  She had a chest CT on 08 April 2022 that showed no evidence of active sarcoid, no parenchymal abnormalities and no mediastinal adenopathy.  Patient has also not had any symptoms consistent with active sarcoid. The patient did have CT noted a very large hiatal hernia (giant paraesophageal hernia) and there was still food material within the esophagus to the level of the thoracic inlet.  There was concern for obstructive esophageal lesion endoscopy was recommended.  The patient was referred to GI due to these findings.  She had upper endoscopy on 10 July by Dr. Tobi Bastos confirmed a very large paraesophageal hernia (hernia type III) and no esophageal strictures.  She was then referred to Dr. Everlene Farrier.  Patient underwent paraesophageal hernia repair with Nissen fundoplication on 26 August 2022 by Dr.  Everlene Farrier.she did well after that.  She then developed issues with left lower quadrant pain for a week before being evaluated on 16 August at the ED as the pain became more pronounced.  She was noted to have leukocytosis perforated diverticulitis and abscess and scattered pneumoperitoneum.  She opted for percutaneous drainage which she had performed at the.  She still has the drainage device in place.  She was discharged from [facility] on 22 August.  She is to see Dr. Claudine Mouton tomorrow for follow-up of this issue.  From the respiratory standpoint, she does not endorse any fevers, chills or sweats.  No fevers since the drain was placed.  No shortness of breath.  No hemoptysis nor hematemesis.  She has not had swallowing difficulties since her paraesophageal hernia repair.  We have reassured her that she does not have recurrent sarcoidosis and this has relieved her.  Previously we ordered pulmonary function testing but the patient never had these done.  Review of Systems A 10 point review of systems was performed and it is as noted above otherwise negative.   Past Medical History:  Diagnosis Date   (HFpEF) heart failure with preserved ejection fraction (HCC)    a.) TTE 05/29/2017: EF 55-60%, mild LAE, G1DD; b.) TTE 09/07/2017: EF >55%, mild LAE, Ao sclerosis without stenosis, degen MV disease, G2DD; c.) TTE 12/02/2018: EF 55-60%, mild LVH, Mild BAE, triv TR, G1DD; d.) TTE 11/22/2019: EF 60-65%, mod LVH, mild TR, G1DD   Anemia in stage 4 chronic kidney disease (HCC)    Arthritis  Cardiomyopathy (HCC)    CKD (chronic kidney disease) stage 4, GFR 15-29 ml/min (HCC)    CVA (cerebral vascular accident) (HCC) 08/2017   a.) no residual deficits   Diabetic neuropathy (HCC)    Diverticulitis    GERD (gastroesophageal reflux disease)    History of hiatal hernia    Hyperlipidemia    Hypertension    Long term current use of antithrombotics/antiplatelets    a.) DAPT therapy (ASA + clopidogrel)   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)    Paraesophageal hernia    PHN (postherpetic neuralgia)    Sarcoidosis    Shingles    Type 2 diabetes mellitus treated with insulin (HCC)    Wears dentures    partial upper    Past Surgical History:  Procedure Laterality Date   CATARACT EXTRACTION W/PHACO Left 02/05/2021   Procedure: CATARACT EXTRACTION PHACO AND INTRAOCULAR LENS PLACEMENT (IOC) LEFT DIABETIC 5.47 00:51.4;  Surgeon: Galen Manila, MD;  Location: [facility];  Service: Ophthalmology;  Laterality: Left;  Diabetic - insulin and oral meds   CATARACT EXTRACTION W/PHACO Right 03/19/2021   Procedure: CATARACT EXTRACTION PHACO AND INTRAOCULAR LENS PLACEMENT (IOC) RIGHT DIABETIC 6.09 00:39.6;  Surgeon: Galen Manila, MD;  Location: [facility];  Service: Ophthalmology;  Laterality: Right;   COLONOSCOPY  12/15/2006   COLONOSCOPY WITH PROPOFOL N/A 06/06/2019   Procedure: COLONOSCOPY WITH PROPOFOL;  Surgeon: Wyline Mood, MD;  Location: [facility];  Service: Gastroenterology;  Laterality: N/A;   COLONOSCOPY WITH PROPOFOL N/A 01/16/2020   Procedure: COLONOSCOPY WITH BIOPSY;  Surgeon: Wyline Mood, MD;  Location: [facility];  Service: Endoscopy;  Laterality: N/A;  Diabetic - insulin and oral meds   ESOPHAGOGASTRODUODENOSCOPY (EGD) WITH PROPOFOL N/A 06/23/2022   Procedure: ESOPHAGOGASTRODUODENOSCOPY (EGD) WITH PROPOFOL;  Surgeon: Wyline Mood, MD;  [facility];  Service: Gastroenterology;  Laterality: N/A;   EYE SURGERY     INSERTION OF MESH  08/26/2022   Procedure: INSERTION OF MESH;  Surgeon: Leafy Ro, MD;  Location: [facility];  Service: General;;   POLYPECTOMY N/A 01/16/2020   Procedure: POLYPECTOMY;  Surgeon: Wyline Mood, MD;  Location: [facility];  Service: Endoscopy;  Laterality: N/A;   TUBAL LIGATION     XI ROBOTIC ASSISTED PARAESOPHAGEAL HERNIA REPAIR N/A 08/26/2022   Procedure: XI ROBOTIC ASSISTED  PARAESOPHAGEAL HERNIA REPAIR, RNFA to assist;  Surgeon: Leafy Ro, MD;  Location: [facility];  Service: General;  Laterality: N/A;    Patient Active Problem List   Diagnosis Date Noted   Diverticulitis 07/31/2023   Diverticulitis of colon with perforation 07/31/2023   Acute pain of right shoulder 07/06/2023   History of GI bleed 12/05/2022   S/P repair of paraesophageal hernia 08/26/2022   Hiatal hernia 07/26/2022   Anemia of chronic disease 04/15/2022   H/O sarcoidosis 01/29/2022   Ganglion cyst of wrist, left 01/29/2022   Lumbar degenerative disc disease 06/07/2020   Lumbar spondylosis 06/07/2020   Chronic pain syndrome 04/18/2020   Sacroiliac joint pain 04/18/2020   Chronic heart failure with preserved ejection fraction (HFpEF) (HCC) 01/11/2020   Grade I diastolic dysfunction 12/15/2019   History of CVA (cerebrovascular accident) 12/01/2019   Hyperparathyroidism due to renal insufficiency (HCC) 08/31/2019   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   Advanced care planning/counseling discussion 04/13/2017   Post herpetic neuralgia 10/10/2016   Hip bursitis 09/18/2015   Poorly controlled type 2 diabetes mellitus with neuropathy (HCC) 05/01/2015  Hyperlipidemia associated with type 2 diabetes mellitus (HCC) 05/01/2015   CKD (chronic kidney disease) stage 4, GFR 15-29 ml/min (HCC) 05/01/2015   Encounter for long-term (current) use of insulin (HCC) 05/01/2015    Family History  Problem Relation Age of Onset   Diabetes Mother    Stroke Mother    Hyperlipidemia Father    Hypertension Father    Diabetes Sister    Diabetes Brother    Gout Son    Diabetes Brother    Kidney disease Son    Lymphoma Sister    Heart attack Sister    Breast cancer Neg Hx     Social History   Tobacco Use   Smoking status: Never    Passive exposure: Never   Smokeless tobacco: Never  Substance Use Topics   Alcohol use: No    Alcohol/week: 0.0 standard drinks  of alcohol    Allergies  Allergen Reactions   Atorvastatin     Myalgias    Gabapentin Other (See Comments)    Speech impairment    Pravastatin     Myalgias    Pregabalin     Speech impairment   Trulicity [Dulaglutide] Hives    Current Meds  Medication Sig   acetaminophen (TYLENOL) 650 MG CR tablet Take 650 mg by mouth every 8 (eight) hours as needed for pain.   amoxicillin-clavulanate (AUGMENTIN) 500-125 MG tablet Take 1 tablet by mouth every 12 (twelve) hours.   aspirin 81 MG tablet Take 81 mg by mouth daily.   carvedilol (COREG) 3.125 MG tablet Take 1 tablet (3.125 mg total) by mouth 2 (two) times daily with a meal. PLEASE CALL OFFICE TO SCHEDULE APPOINTMENT PRIOR TO NEXT REFILL   diclofenac Sodium (VOLTAREN) 1 % GEL Apply 4 g topically 4 (four) times daily.   docusate sodium (COLACE) 100 MG capsule Take 100 mg by mouth daily as needed for mild constipation.   gabapentin (NEURONTIN) 100 MG capsule Take 1 capsule (100 mg total) by mouth at bedtime.   HYDROcodone-acetaminophen (NORCO/VICODIN) 5-325 MG tablet Take 1-2 tablets by mouth every 4 (four) hours as needed for moderate pain or severe pain.   insulin lispro (HUMALOG) 100 UNIT/ML injection Inject 8 Units into the skin 3 (three) times daily before meals.   meclizine (ANTIVERT) 12.5 MG tablet Take 12.5 mg by mouth 3 (three) times daily.   methocarbamol (ROBAXIN) 500 MG tablet Take 1 tablet (500 mg total) by mouth daily as needed for muscle spasms.   naloxone (NARCAN) nasal spray 4 mg/0.1 mL    NOVOFINE 32G X 6 MM MISC USE AS DIRECTED THREE TIMES DAILY WITH HUMALOG   olmesartan (BENICAR) 40 MG tablet Take 1 tablet (40 mg total) by mouth daily. (Patient taking differently: Take 40 mg by mouth every morning.)   pantoprazole (PROTONIX) 40 MG tablet Take 1 tablet (40 mg total) by mouth daily.   pioglitazone (ACTOS) 30 MG tablet Take 30 mg by mouth daily.   polyethylene glycol (MIRALAX / GLYCOLAX) 17 g packet Take 17 g by mouth 2  (two) times daily.   rosuvastatin (CRESTOR) 5 MG tablet Take 1 tablet (5 mg total) by mouth daily. PLEASE CALL OFFICE TO SCHEDULE APPOINTMENT PRIOR TO NEXT REFILL   simethicone (MYLICON) 80 MG chewable tablet Chew 1 tablet (80 mg total) by mouth 4 (four) times daily as needed for flatulence.   torsemide (DEMADEX) 20 MG tablet Take 2 tablets (40 mg total) by mouth daily. (Patient taking differently: Take 40 mg by mouth  every morning.)   vitamin B-12 (CYANOCOBALAMIN) 100 MCG tablet Take 100 mcg by mouth daily.   Vitamin D, Cholecalciferol, 50 MCG (2000 UT) CAPS Take 2,000 Units by mouth daily.    Immunization History  Administered Date(s) Administered   Fluad Quad(high Dose 65+) 09/30/2019, 01/29/2021, 10/29/2021, 12/22/2022   Influenza, High Dose Seasonal PF 09/10/2016, 11/16/2018   Influenza,inj,Quad PF,6+ Mos 09/18/2015   Influenza-Unspecified 09/18/2015, 09/08/2017   Moderna Sars-Covid-2 Vaccination 02/15/2020, 03/07/2020, 09/14/2020   PPD Test 07/15/2016, 07/29/2016   Pneumococcal Conjugate-13 06/07/2014   Pneumococcal Polysaccharide-23 09/07/2013   Td 06/12/2005   Td (Adult), 2 Lf Tetanus Toxid, Preservative Free 06/12/2005   Tdap 10/10/2016   Zoster, Live 09/13/2013        Objective:     BP 134/80 (BP Location: Right Arm, Cuff Size: Normal)   Pulse 80   Temp 98.3 F (36.8 C)   Ht 5\' 7"  (1.702 m)   Wt 139 lb (63 kg) Comment: per patient.in a wheelchair today  LMP  (LMP Unknown)   SpO2 100%   BMI 21.77 kg/m   SpO2: 100 % O2 Device: None (Room air)  GENERAL: Well-developed, well-nourished woman, no acute distress.  Presents in transport chair.  No conversational dyspnea.  Normally ambulates with assistance of a cane. HEAD: Normocephalic, atraumatic.  EYES: Pupils equal, round, reactive to light.  No scleral icterus.  MOUTH: Oral mucosa moist.  No thrush NECK: Supple. No thyromegaly. Trachea midline. No JVD.  No adenopathy. PULMONARY: Good air entry bilaterally.  No  adventitious sounds.  CARDIOVASCULAR: S1 and S2. Regular rate and rhythm.  No rubs, murmurs or gallops heard. ABDOMEN: Abscess drainage present. MUSCULOSKELETAL: No joint deformity, no clubbing, he has +1 edema at the ankles.[name]  NEUROLOGIC: No overt focal deficit, gait not tested, speech is fluent. SKIN: Intact,warm,dry. PSYCH: Mood and behavior normal.     Assessment & Plan:     ICD-10-CM   1. Personal history of sarcoidosis  Z86.2    Clinically no evidence of recurrence CT chest performed August 2023 without evidence of sarcoidosis    2. Diverticulitis of colon with perforation  K57.20    Currently with drain in place Following with surgery    3. S/P repair of paraesophageal hernia  Z98.890    Z87.19    Performed September 2023 by Dr. Everlene Farrier     Patient continues to be asymptomatic from the pulmonary standpoint.  There is no evidence of active sarcoidosis.  She can follow here on an as-needed basis.    Gailen Shelter, MD Advanced Bronchoscopy PCCM [facility]-    *This note was dictated using voice recognition software/Dragon.  Despite best efforts to proofread, errors can occur which can change the meaning. Any transcriptional errors that result from this process are unintentional and may not be fully corrected at the time of dictation.

## 2023-08-13 ENCOUNTER — Encounter: Payer: Self-pay | Admitting: Surgery

## 2023-08-13 ENCOUNTER — Ambulatory Visit: Payer: Medicare Other | Admitting: Surgery

## 2023-08-13 VITALS — BP 187/82 | HR 83 | Temp 98.8°F | Ht 67.0 in | Wt 136.6 lb

## 2023-08-13 DIAGNOSIS — K5720 Diverticulitis of large intestine with perforation and abscess without bleeding: Secondary | ICD-10-CM

## 2023-08-13 NOTE — Progress Notes (Signed)
Surgical Clinic Progress/Follow-up Note   HPI:  77 y.o. Female presents to clinic for diverticular abscess status post percutaneous drainage follow-up.  1 week since her discharge. Patient is repetitively getting corrected by her husband who is present with her.  She reports she is still taking her antibiotic.  She has not taken a lot of responsibility at keeping her drain bulb charged, but her husband attempts to keep up with it.  Some discrepancy at how well they are doing.  Dressing from 8/21 intact.  She denies having any bowel activity since her MiraLAX in the hospital, despite having it prescribed, she did not know when she should take it.  Reports stability of prior issues and has been tolerating minimal regular diet with +flatus and no significant BM's since discharge, denies N/V, fever/chills, CP, or SOB.  Review of Systems:  Constitutional: denies fever/chills  ENT: denies sore throat, hearing problems  Respiratory: denies shortness of breath, wheezing  Cardiovascular: denies chest pain, palpitations  Gastrointestinal: denies abdominal pain, N/V, or diarrhea/and bowel function as per interval history Skin: Denies any other rashes or skin discolorations except post-surgical wounds  Vital Signs:  BP (!) 187/82   Pulse 83   Temp 98.8 F (37.1 C) (Oral)   Ht 5\' 7"  (1.702 m)   Wt 136 lb 9.6 oz (62 kg)   LMP  (LMP Unknown)   SpO2 100%   BMI 21.39 kg/m    Physical Exam:  Constitutional:  -- Normal body habitus  -- Awake, alert, and oriented x3  Pulmonary:  -- No crackles -- Equal breath sounds bilaterally -- Breathing non-labored at rest Cardiovascular:  -- RRR Gastrointestinal:  -- Soft and non-distended, non-tender/with mild peri-drain left lower quadrant tenderness to palpation, no guarding/rebound tenderness -- Dressing removed, drain site intact and well secured still.  Exit site is medial to the anchor.  No peri-drain erythema or induration, minimal soilage on  dressing. -- No abdominal masses appreciated, pulsatile or otherwise   Musculoskeletal / Integumentary:  -- Wounds or skin discoloration: None appreciated -- Extremities: B/L UE and LE FROM, hands and feet warm, lower extremity edema present  Laboratory studies: CBC:  Lab Results  Component Value Date   WBC 7.7 08/05/2023   RBC 3.45 (L) 08/05/2023   BMP:  Lab Results  Component Value Date   GLUCOSE 108 (H) 08/05/2023   CO2 21 (L) 08/05/2023   BUN 34 (H) 08/05/2023   BUN 35 (H) 12/22/2022   CREATININE 2.25 (H) 08/05/2023   CALCIUM 8.0 (L) 08/05/2023    Imaging: No new pertinent imaging available for review   Assessment:  77 y.o. yo Female with a problem list including...  Patient Active Problem List   Diagnosis Date Noted   Diverticulitis 07/31/2023   Diverticulitis of colon with perforation 07/31/2023   Acute pain of right shoulder 07/06/2023   History of GI bleed 12/05/2022   S/P repair of paraesophageal hernia 08/26/2022   Hiatal hernia 07/26/2022   Anemia of chronic disease 04/15/2022   H/O sarcoidosis 01/29/2022   Ganglion cyst of wrist, left 01/29/2022   Lumbar degenerative disc disease 06/07/2020   Lumbar spondylosis 06/07/2020   Chronic pain syndrome 04/18/2020   Sacroiliac joint pain 04/18/2020   Chronic heart failure with preserved ejection fraction (HFpEF) (HCC) 01/11/2020   Grade I diastolic dysfunction 12/15/2019   History of CVA (cerebrovascular accident) 12/01/2019   Hyperparathyroidism due to renal insufficiency (HCC) 08/31/2019   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)  09/22/2017   Advanced care planning/counseling discussion 04/13/2017   Post herpetic neuralgia 10/10/2016   Hip bursitis 09/18/2015   Poorly controlled type 2 diabetes mellitus with neuropathy (HCC) 05/01/2015   Hyperlipidemia associated with type 2 diabetes mellitus (HCC) 05/01/2015   CKD (chronic kidney disease) stage 4, GFR 15-29 ml/min (HCC) 05/01/2015    Encounter for long-term (current) use of insulin (HCC) 05/01/2015    presents to clinic for follow-up evaluation of left lower quadrant drain of abscess, progressing adequately, though has resorted to her old habits of not moving her bowels, or utilizing laxatives to help with bowel activity..  Plan:   -Appreciate IR's follow-up for evaluation of her drain, suspecting Colonic fistula potential.  This is scheduled for 9/3, and have reviewed with patient and her husband the appointment time and place.             - return to clinic in 3 weeks or as needed, instructed to call office if any questions or concerns  All of the above recommendations were discussed with the patient and patient's family, and all of patient's and family's questions were answered to their expressed satisfaction.  These notes generated with voice recognition software. I apologize for typographical errors.  Campbell Lerner, MD, FACS [place]: [facility] General Surgery - Partnering for exceptional care. Office: [phone]

## 2023-08-13 NOTE — Patient Instructions (Signed)
Advised to pursue a goal of 25 to 30 g of fiber daily.  Made aware that the majority of this may be through natural sources, but advised to be aware of actual consumption and to ensure minimal consumption by daily supplementation.  Various forms of supplements discussed.  Recommended Psyllium husk, that mixes well with applesauce, or the powder which goes down well shaken with chocolate milk.  Strongly advised to consume more fluids to ensure adequate hydration, instructed to watch color of urine to determine adequacy of hydration.  Clarity is pursued in urine output, and bowel activity that correlates to significant meal intake.   We need to avoid deferring having bowel movements, advised to take the time at the first sign of sensation, typically following meals, and in the morning.   Subsequent utilization of MiraLAX may be needed ensure at least daily movement, ideally twice daily bowel movements.  If multiple doses of MiraLAX are necessary utilize them. Never skip a day...  To be regular, we must do the above EVERY day.    If you have any concerns or questions, please feel free to call our office.   Diverticulitis  Diverticulitis is when small pouches in your colon get infected or swollen. This causes pain in your belly (abdomen) and watery poop (diarrhea). The small pouches are called diverticula. They may form if you have a condition called diverticulosis. What are the causes? You may get this condition if poop (stool) gets trapped in the pouches in your colon. The poop lets germs (bacteria) grow. This causes an infection. What increases the risk? You are more likely to get this condition if you have small pouches in your colon. You are also more likely to get it if: You are overweight or very overweight (obese). You do not exercise enough. You drink alcohol. You smoke. You eat a lot of red meat, like beef, pork, or lamb. You do not eat enough fiber. You are older than 75 years of  age. What are the signs or symptoms? Pain in your belly. Pain is often on the left side, but it may be felt in other spots too. Fever and chills. Feeling like you may vomit. Vomiting. Having cramps. Feeling full. Changes in how often you poop. Blood in your poop. How is this treated? Most cases are treated at home. You may be told to: Take over-the-counter pain medicines. Only eat and drink clear liquids. Take antibiotics. Rest. Very bad cases may need to be treated at a hospital. Treatment may include: Not eating or drinking. Taking pain medicines. Getting antibiotics through an IV tube. Getting fluid and food through an IV tube. Having surgery. When you are feeling better, you may need to have a test to look at your colon (colonoscopy). Follow these instructions at home: Medicines Take over-the-counter and prescription medicines only as told by your doctor. These include: Fiber pills. Probiotics. Medicines to make your poop soft (stool softeners). If you were prescribed antibiotics, take them as told by your doctor. Do not stop taking them even if you start to feel better. Ask your doctor if you should avoid driving or using machines while you are taking your medicine. Eating and drinking  Follow the diet told by your doctor. You may need to only eat and drink liquids. When you feel better, you may be able to eat more foods. You may also be told to eat a lot of fiber. Fiber helps you poop. Foods with fiber include berries, beans, lentils, and green  vegetables. Try not to eat red meat. General instructions Do not smoke or use any products that contain nicotine or tobacco. If you need help quitting, ask your doctor. Exercise 3 or more times a week. Try to go for 30 minutes each time. Exercise enough to sweat and make your heart beat faster. Contact a doctor if: Your pain gets worse. You are not pooping like normal. Your symptoms do not get better. Your symptoms get worse  very fast. You have a fever. You vomit more than one time. You have poop that is: Bloody. Black. Tarry. This information is not intended to replace advice given to you by your health care provider. Make sure you discuss any questions you have with your health care provider. Document Revised: 08/28/2022 Document Reviewed: 08/28/2022 Elsevier Patient Education  2024 [employer].

## 2023-08-17 ENCOUNTER — Other Ambulatory Visit: Payer: Self-pay | Admitting: Cardiovascular Disease

## 2023-08-18 ENCOUNTER — Ambulatory Visit
Admission: RE | Admit: 2023-08-18 | Discharge: 2023-08-18 | Disposition: A | Discharge status: Home / Self Care | Payer: Medicare Other | Source: Ambulatory Visit | Attending: Surgery | Admitting: Surgery

## 2023-08-18 ENCOUNTER — Other Ambulatory Visit: Payer: Medicare Other

## 2023-08-18 ENCOUNTER — Telehealth: Payer: Self-pay | Admitting: Cardiovascular Disease

## 2023-08-18 ENCOUNTER — Ambulatory Visit
Admission: RE | Admit: 2023-08-18 | Discharge: 2023-08-18 | Disposition: A | Discharge status: Home / Self Care | Payer: Medicare Other | Source: Ambulatory Visit | Attending: Student | Admitting: Student

## 2023-08-18 ENCOUNTER — Encounter: Payer: Self-pay | Admitting: Oncology

## 2023-08-18 DIAGNOSIS — K5720 Diverticulitis of large intestine with perforation and abscess without bleeding: Secondary | ICD-10-CM

## 2023-08-18 DIAGNOSIS — K632 Fistula of intestine: Secondary | ICD-10-CM | POA: Diagnosis not present

## 2023-08-18 HISTORY — PX: IR RADIOLOGIST EVAL & MGMT: IMG5224

## 2023-08-18 NOTE — Telephone Encounter (Signed)
Hi,  Could you please contact the patient for a 12 month follow up visit? The patient was last seen by Dr. Mariah Milling on 07-01-2022. Thank you so much.

## 2023-08-18 NOTE — Telephone Encounter (Signed)
 Left voice mail, pt needs appt scheduled from recall.

## 2023-08-18 NOTE — Telephone Encounter (Signed)
Left voice mail

## 2023-08-19 ENCOUNTER — Telehealth: Payer: Medicare Other

## 2023-08-19 ENCOUNTER — Other Ambulatory Visit: Payer: Medicare Other

## 2023-08-19 ENCOUNTER — Telehealth: Payer: Self-pay

## 2023-08-19 DIAGNOSIS — E1122 Type 2 diabetes mellitus with diabetic chronic kidney disease: Secondary | ICD-10-CM | POA: Diagnosis not present

## 2023-08-19 DIAGNOSIS — D631 Anemia in chronic kidney disease: Secondary | ICD-10-CM | POA: Diagnosis not present

## 2023-08-19 DIAGNOSIS — N184 Chronic kidney disease, stage 4 (severe): Secondary | ICD-10-CM | POA: Diagnosis not present

## 2023-08-19 DIAGNOSIS — N2581 Secondary hyperparathyroidism of renal origin: Secondary | ICD-10-CM | POA: Diagnosis not present

## 2023-08-19 DIAGNOSIS — I129 Hypertensive chronic kidney disease with stage 1 through stage 4 chronic kidney disease, or unspecified chronic kidney disease: Secondary | ICD-10-CM | POA: Diagnosis not present

## 2023-08-19 DIAGNOSIS — R809 Proteinuria, unspecified: Secondary | ICD-10-CM | POA: Diagnosis not present

## 2023-08-19 DIAGNOSIS — E875 Hyperkalemia: Secondary | ICD-10-CM | POA: Diagnosis not present

## 2023-08-19 NOTE — Patient Outreach (Signed)
  Care Management   Follow Up Note   08/19/2023 Name: Tonya [name] MRN: [id number] DOB: 1946-12-15   Referred by: Marjie Skiff, NP Reason for referral : Care Management (RNCM: Follow up for Chronic Disease Management and Care Coordination Needs-attempt)   An unsuccessful telephone outreach was attempted today. The patient was referred to the case management team for assistance with care management and care coordination.   Follow Up Plan: A HIPPA compliant phone message was left for the patient providing contact information and requesting a return call.   Alto Denver RN, MSN, CCM RN Care Manager  [facility]  Ambulatory Care Management  Direct Number: [phone]

## 2023-08-24 ENCOUNTER — Telehealth: Payer: Self-pay | Admitting: Cardiovascular Disease

## 2023-08-24 NOTE — Telephone Encounter (Signed)
Left voicemail, pt needs to be scheduled for appt from recall

## 2023-08-24 NOTE — Telephone Encounter (Signed)
Left voice mail

## 2023-08-27 DIAGNOSIS — M5412 Radiculopathy, cervical region: Secondary | ICD-10-CM | POA: Diagnosis not present

## 2023-08-27 DIAGNOSIS — M5451 Vertebrogenic low back pain: Secondary | ICD-10-CM | POA: Diagnosis not present

## 2023-08-27 DIAGNOSIS — M5030 Other cervical disc degeneration, unspecified cervical region: Secondary | ICD-10-CM | POA: Diagnosis not present

## 2023-08-27 DIAGNOSIS — M47812 Spondylosis without myelopathy or radiculopathy, cervical region: Secondary | ICD-10-CM | POA: Insufficient documentation

## 2023-08-27 DIAGNOSIS — M5136 Other intervertebral disc degeneration, lumbar region: Secondary | ICD-10-CM | POA: Diagnosis not present

## 2023-08-27 DIAGNOSIS — M533 Sacrococcygeal disorders, not elsewhere classified: Secondary | ICD-10-CM | POA: Diagnosis not present

## 2023-08-27 DIAGNOSIS — M4802 Spinal stenosis, cervical region: Secondary | ICD-10-CM | POA: Diagnosis not present

## 2023-08-27 NOTE — Telephone Encounter (Signed)
Pt scheduled on 9/16

## 2023-08-29 NOTE — Progress Notes (Deleted)
Cardiology Office Note  Date:  08/31/2023   ID:  EDIT JIMINIAN, DOB [birth year], MRN [id number]  PCP:  Marjie Skiff, NP   No chief complaint on file.   HPI:  77 yo woman with past medical hx of  DM, HBA1C 13 down to 7 to 8 Chronic Leg edema HTN Hyperlipidemia Chronic kidney disease, followed by nephrology, CR 2.25 Presenting for f/u of her cardiomyopathy, leg swelling''  Last seen by myself 7/23   Reports sedentary, sits in her favorite chair watches TV Walks with a cane, no falls No regular exercise program Denies any shortness of breath, no chest pain, no tachypalpitations concerning for arrhythmia Minimal leg edema Limited by back pain, followed by pain clinic  Lab work reviewed A1c 8.5 Total cholesterol 176 LDL 76 Creatinine 2.4 BUN 40  Echocardiogram December 2020  ejection fraction 60 to 65%  CT chest, images pulled up and reviewed Large hiatal hernia. Minimal coronary calcium, minimal aortic athero  EKG personally reviewed by myself on todays visit Shows normal sinus rhythm rate 77 with  T wave abnormality 1 and aVL,   Last seen in clinic by one of our providers January 2021 On that visit reported blood pressure 120 and 130 systolic, had little bit of swelling at the end of the day thought it was a side effect from the Lyrica Was on Crestor, Lasix, carvedilol low-dose with Plavix and aspirin  Echocardiogram December 2020 normal ejection fraction  PMH:   has a past medical history of (HFpEF) heart failure with preserved ejection fraction (HCC), 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  PSH:    Past Surgical History:  Procedure Laterality Date   CATARACT EXTRACTION W/PHACO Left 02/05/2021   Procedure: CATARACT EXTRACTION PHACO AND INTRAOCULAR LENS PLACEMENT (IOC) LEFT DIABETIC 5.47 00:51.4;  Surgeon: Galen Manila, MD;  Location: [facility];  Service: Ophthalmology;  Laterality: Left;  Diabetic - insulin and oral meds   CATARACT EXTRACTION W/PHACO Right 03/19/2021   Procedure: CATARACT EXTRACTION PHACO AND INTRAOCULAR LENS PLACEMENT (IOC) RIGHT DIABETIC 6.09 00:39.6;  Surgeon: Galen Manila, MD;  Location: [facility];  Service: Ophthalmology;  Laterality: Right;   COLONOSCOPY  12/15/2006   COLONOSCOPY WITH PROPOFOL N/A 06/06/2019   Procedure: COLONOSCOPY WITH PROPOFOL;  Surgeon: Wyline Mood, MD;  Location: [facility];  Service: Gastroenterology;  Laterality: N/A;   COLONOSCOPY WITH PROPOFOL N/A 01/16/2020   Procedure: COLONOSCOPY WITH BIOPSY;  Surgeon: Wyline Mood, MD;  Location: [facility];  Service: Endoscopy;  Laterality: N/A;  Diabetic - insulin and oral meds   ESOPHAGOGASTRODUODENOSCOPY (EGD) WITH PROPOFOL N/A 06/23/2022   Procedure: ESOPHAGOGASTRODUODENOSCOPY (EGD) WITH PROPOFOL;  Surgeon: Wyline Mood, MD;  Location: [facility] END[facility];  Service: Gastroenterology;  Laterality: N/A;   EYE SURGERY     INSERTION OF MESH  08/26/2022   Procedure: INSERTION OF MESH;  Surgeon: Leafy Ro, MD;  Location: [facility] OR[facility];  Service: General;;   IR RADIOLOGIST EVAL & MGMT  08/18/2023   POLYPECTOMY N/A 01/16/2020   Procedure: POLYPECTOMY;  Surgeon: Wyline Mood, MD;  Location: [facility];  Service: Endoscopy;  Laterality: N/A;   TUBAL LIGATION     XI ROBOTIC ASSISTED PARAESOPHAGEAL HERNIA REPAIR N/A 08/26/2022   Procedure: XI ROBOTIC ASSISTED  PARAESOPHAGEAL HERNIA REPAIR, RNFA to assist;  Surgeon: Leafy Ro, MD;  Location: [facility] OR[facility];  Service: General;  Laterality: N/A;    Current Outpatient Medications  Medication Sig Dispense Refill   acetaminophen (TYLENOL) 650 MG CR tablet Take 650 mg by mouth every 8 (eight) hours as needed for pain.     amoxicillin-clavulanate (AUGMENTIN) 500-125 MG tablet Take 1 tablet by mouth every 12 (twelve) hours. 22 tablet 0   aspirin 81 MG tablet Take 81 mg by mouth daily.     carvedilol (COREG) 3.125 MG tablet Take 1 tablet (3.125 mg total) by mouth 2 (two) times daily with a meal. Overdue yearly follow up.  PLEASE CALL OFFICE TO SCHEDULE APPOINTMENT PRIOR TO NEXT REFILL (first attempt) 60 tablet 0   diclofenac Sodium (VOLTAREN) 1 % GEL Apply 4 g topically 4 (four) times daily. 4 g 1   docusate sodium (COLACE) 100 MG capsule Take 100 mg by mouth daily as needed for mild constipation.     gabapentin (NEURONTIN) 100 MG capsule Take 1 capsule (100 mg total) by mouth at bedtime. 90 capsule 1   HYDROcodone-acetaminophen (NORCO/VICODIN) 5-325 MG tablet Take 1-2 tablets by mouth every 4 (four) hours as needed for moderate pain or severe pain. 30 tablet 0   insulin lispro (HUMALOG) 100 UNIT/ML injection Inject 8 Units into the skin 3 (three) times daily before meals.     meclizine (ANTIVERT) 12.5 MG tablet Take 12.5 mg by mouth 3 (three) times daily.     naloxone (NARCAN) nasal spray 4 mg/0.1 mL      NOVOFINE 32G X 6 MM MISC USE AS DIRECTED THREE TIMES DAILY WITH HUMALOG 100 each 3   olmesartan (BENICAR) 40 MG tablet Take 1 tablet (40 mg total) by mouth daily. (Patient taking differently: Take 40 mg by mouth every morning.) 90 tablet 4   pantoprazole (PROTONIX) 40 MG tablet Take 1 tablet (40 mg total) by mouth daily. 90 tablet 3   pioglitazone (ACTOS) 30 MG tablet Take 30 mg by mouth daily.     polyethylene glycol (MIRALAX / GLYCOLAX) 17 g packet Take 17 g by mouth 2 (two) times daily. 14 each 0    rosuvastatin (CRESTOR) 5 MG tablet Take 1 tablet (5 mg total) by mouth daily. Overdue yearly follow up.  PLEASE CALL OFFICE TO SCHEDULE APPOINTMENT PRIOR TO NEXT REFILL (first attempt) 30 tablet 0   simethicone (MYLICON) 80 MG chewable tablet Chew 1 tablet (80 mg total) by mouth 4 (four) times daily as needed for flatulence. 30 tablet 0   torsemide (DEMADEX) 20 MG tablet Take 2 tablets (40 mg total) by mouth daily. (Patient taking differently: Take 40 mg by mouth every morning.) 180 tablet 3   vitamin B-12 (CYANOCOBALAMIN) 100 MCG tablet Take 100 mcg by mouth daily.     Vitamin D, Cholecalciferol, 50 MCG (2000 UT) CAPS Take 2,000 Units by mouth daily.     No current facility-administered medications for this visit.    Allergies:   Atorvastatin, Gabapentin, Pravastatin, Pregabalin, and Trulicity [dulaglutide]   Social History:  The patient  reports that she has never smoked. She has never been exposed to tobacco smoke. She has never used smokeless tobacco. She reports that she does not drink alcohol and does not use drugs.   Family History:   family history includes Diabetes in  her brother, brother, mother, and sister; Gout in her son; Heart attack in her sister; Hyperlipidemia in her father; Hypertension in her father; Kidney disease in her son; Lymphoma in her sister; Stroke in her mother.    Review of Systems: Review of Systems  Constitutional: Negative.   Respiratory: Negative.    Cardiovascular: Negative.   Gastrointestinal: Negative.   Musculoskeletal:  Positive for back pain.  Neurological: Negative.   Psychiatric/Behavioral: Negative.    All other systems reviewed and are negative.   PHYSICAL EXAM: VS:  LMP  (LMP Unknown)  , BMI There is no height or weight on file to calculate BMI. Constitutional:  oriented to person, place, and time. No distress.  HENT:  Head: Grossly normal Eyes:  no discharge. No scleral icterus.  Neck: No JVD, no carotid bruits  Cardiovascular: Regular  rate and rhythm, no murmurs appreciated Pulmonary/Chest: Clear to auscultation bilaterally, no wheezes or rails Abdominal: Soft.  no distension.  no tenderness.  Musculoskeletal: Normal range of motion Neurological:  normal muscle tone. Coordination normal. No atrophy Skin: Skin warm and dry Psychiatric: normal affect, pleasant   Recent Labs: 12/22/2022: TSH 2.220 07/31/2023: ALT 14 08/05/2023: BUN 34; Creatinine, Ser 2.25; Magnesium 2.2; Platelets 290; Potassium 4.4; Sodium 140 08/11/2023: Hemoglobin 9.1    Lipid Panel Lab Results  Component Value Date   CHOL 168 06/15/2023   HDL 76 06/15/2023   LDLCALC 79 06/15/2023   TRIG 65 06/15/2023    Wt Readings from Last 3 Encounters:  08/13/23 136 lb 9.6 oz (62 kg)  08/12/23 139 lb (63 kg)  08/04/23 141 lb 6.4 oz (64.1 kg)     ASSESSMENT AND PLAN:  Fatigue Long discussion concerning need for exercise program to maintain leg /gait stability, strength  Shortness of breath Sedentary, recommended regular walking program normal ejection fraction by echocardiogram 2000, 2021 No change to clinical appearance on today's visit    Poorly controlled type 2 diabetes mellitus with neuropathy (HCC) Discussed diet with her, medication compliance Already with complicating kidney disease,  Stressed the importance of aggressive diabetes control  Chronic kidney disease, stage 4, severely decreased GFR (HCC)  kidney disease likely secondary to long-standing diabetes, hypertension  Creatinine greater than 2 Followed by nephrology, on torsemide  Mixed hyperlipidemia  Continue Crestor Minimal coronary or aortic plaque noted on CT scan images reviewed personally by myself  Hypertension On  ARB, per nephrology Avoid calcium channel blockers given history of leg swelling   Total encounter time more than 30 minutes  Greater than 50% was spent in counseling and coordination of care with the patient     No orders of the defined types were  placed in this encounter.    Signed, Dossie Arbour, M.D., Ph.D. 08/31/2023  [facility] [place], [place] [phone]

## 2023-08-31 ENCOUNTER — Ambulatory Visit: Payer: Medicare Other | Attending: Cardiovascular Disease | Admitting: Cardiovascular Disease

## 2023-08-31 DIAGNOSIS — N184 Chronic kidney disease, stage 4 (severe): Secondary | ICD-10-CM

## 2023-08-31 DIAGNOSIS — I130 Hypertensive heart and chronic kidney disease with heart failure and stage 1 through stage 4 chronic kidney disease, or unspecified chronic kidney disease: Secondary | ICD-10-CM

## 2023-08-31 DIAGNOSIS — I5032 Chronic diastolic (congestive) heart failure: Secondary | ICD-10-CM

## 2023-08-31 DIAGNOSIS — E1169 Type 2 diabetes mellitus with other specified complication: Secondary | ICD-10-CM

## 2023-08-31 DIAGNOSIS — E1140 Type 2 diabetes mellitus with diabetic neuropathy, unspecified: Secondary | ICD-10-CM

## 2023-09-03 ENCOUNTER — Encounter: Payer: Self-pay | Admitting: Surgery

## 2023-09-03 ENCOUNTER — Other Ambulatory Visit: Payer: Self-pay | Admitting: Surgery

## 2023-09-03 ENCOUNTER — Ambulatory Visit: Payer: Medicare Other | Admitting: Surgery

## 2023-09-03 VITALS — BP 168/90 | HR 91 | Temp 98.3°F | Ht 67.0 in | Wt 133.2 lb

## 2023-09-03 DIAGNOSIS — K5720 Diverticulitis of large intestine with perforation and abscess without bleeding: Secondary | ICD-10-CM

## 2023-09-03 DIAGNOSIS — K632 Fistula of intestine: Secondary | ICD-10-CM | POA: Insufficient documentation

## 2023-09-03 NOTE — Patient Instructions (Addendum)
You may take Miralax twice daily.   If you have any concerns or questions, please feel free to call our office. See follow up appointment.   Constipation, Adult Constipation is when a person has trouble pooping (having a bowel movement). When you have this condition, you may poop fewer than 3 times a week. Your poop (stool) may also be dry, hard, or bigger than normal. Follow these instructions at home: Eating and drinking  Eat foods that have a lot of fiber, such as: Fresh fruits and vegetables. Whole grains. Beans. Eat less of foods that are low in fiber and high in fat and sugar, such as: [place] fries. Hamburgers. Cookies. Candy. Soda. Drink enough fluid to keep your pee (urine) pale yellow. General instructions Exercise regularly or as told by your doctor. Try to do 150 minutes of exercise each week. Go to the restroom when you feel like you need to poop. Do not hold it in. Take over-the-counter and prescription medicines only as told by your doctor. These include any fiber supplements. When you poop: Do deep breathing while relaxing your lower belly (abdomen). Relax your pelvic floor. The pelvic floor is a group of muscles that support the rectum, bladder, and intestines (as well as the uterus in women). Watch your condition for any changes. Tell your doctor if you notice any. Keep all follow-up visits as told by your doctor. This is important. Contact a doctor if: You have pain that gets worse. You have a fever. You have not pooped for 4 days. You vomit. You are not hungry. You lose weight. You are bleeding from the opening of the butt (anus). You have thin, pencil-like poop. Get help right away if: You have a fever, and your symptoms suddenly get worse. You leak poop or have blood in your poop. Your belly feels hard or bigger than normal (bloated). You have very bad belly pain. You feel dizzy or you faint. Summary Constipation is when a person poops fewer than 3  times a week, has trouble pooping, or has poop that is dry, hard, or bigger than normal. Eat foods that have a lot of fiber. Drink enough fluid to keep your pee (urine) pale yellow. Take over-the-counter and prescription medicines only as told by your doctor. These include any fiber supplements. This information is not intended to replace advice given to you by your health care provider. Make sure you discuss any questions you have with your health care provider. Document Revised: 10/15/2022 Document Reviewed: 10/15/2022 Elsevier Patient Education  2024 [employer].

## 2023-09-03 NOTE — Progress Notes (Signed)
Surgical Clinic Progress/Follow-up Note   HPI:  Follow-up for known colonic fistula, drain present.  Original drain placement on July 31, 2023.  Evaluated September 3, with repeat drain check was planned for 2 weeks after her evaluation.   She reports she continues to have issues with her bowels moving, and expects them to move after 1 dose of Colace or after a single dose of MiraLAX.  She reports her appetites not been that great.  But she denies any fevers, chills, nausea or vomiting.  She reports her pain is nothing like when she came in the hospital.  And is essentially a low-grade persistent tenderness in the drain site.  She was not aware of follow-up for the evaluated fistula earlier this month.   Review of Systems:  Constitutional: denies fever/chills  ENT: denies sore throat, hearing problems  Respiratory: denies shortness of breath, wheezing  Cardiovascular: denies chest pain, palpitations  Gastrointestinal: denies abdominal pain, N/V, or diarrhea/and bowel function as per interval history Skin: Denies any other rashes or skin discolorations except post-surgical wounds  Vital Signs:  BP (!) 168/90   Pulse 91   Temp 98.3 F (36.8 C) (Oral)   Ht 5\' 7"  (1.702 m)   Wt 133 lb 3.2 oz (60.4 kg)   LMP  (LMP Unknown)   SpO2 98%   BMI 20.86 kg/m    Physical Exam:  Constitutional:  -- Normal body habitus  -- Awake, alert, and oriented x3  Pulmonary:  -- No crackles -- Equal breath sounds bilaterally -- Breathing non-labored at rest Cardiovascular:  -- RRR Gastrointestinal:  -- Soft and non-distended, non-tender/with mild peri-drain left lower quadrant tenderness to palpation, no guarding/rebound tenderness -- Dressing removed, drain site intact and well secured still.  Exit site is medial to the anchor.  No peri-drain erythema or induration, minimal purulent drainage on dressing.  Some of the catheter feels that it may have migrated to the subcutaneous space. -- No abdominal  masses appreciated, pulsatile or otherwise   Musculoskeletal / Integumentary:  -- Wounds or skin discoloration: None appreciated -- Extremities: B/L UE and LE FROM, hands and feet warm, lower extremity edema present  Laboratory studies: CBC:  Lab Results  Component Value Date   WBC 7.7 08/05/2023   RBC 3.45 (L) 08/05/2023   BMP:  Lab Results  Component Value Date   GLUCOSE 108 (H) 08/05/2023   CO2 21 (L) 08/05/2023   BUN 34 (H) 08/05/2023   BUN 35 (H) 12/22/2022   CREATININE 2.25 (H) 08/05/2023   CALCIUM 8.0 (L) 08/05/2023    Imaging: INDICATION: Drain follow-up; perforated diverticulitis status post placement percutaneous drainage catheter on July 31, 2023   EXAM: Drainage catheter injection using fluoroscopy   MEDICATIONS: None   ANESTHESIA/SEDATION: None   COMPLICATIONS: None immediate.   PROCEDURE: Informed written consent was obtained from the patient after a thorough discussion of the procedural risks, benefits and alternatives. All questions were addressed. Maximal Sterile Barrier Technique was utilized including caps, mask, sterile gowns, sterile gloves, sterile drape, hand hygiene and skin antiseptic. A timeout was performed prior to the initiation of the procedure.   Patient was placed supine on the exam table. The left lower quadrant drainage catheter was accessed using sterile technique. Contrast injection was performed under fluoroscopy. This demonstrated a decompressed abscess cavity. There was brisk fistulous communication with the adjacent sigmoid colon. Drainage catheter was reattached to bag drainage. No immediate complication.   IMPRESSION: 1. Left lower quadrant drainage catheter demonstrates fistulous communication  with adjacent distal descending/proximal sigmoid colon. 2. Drainage catheter replaced to bag drainage. 3. Follow-up with repeat drain check in 2 weeks. Continue surgical follow-up.     Electronically Signed   By: Olive Bass M.D.   On: 08/18/2023 11:50   Assessment:  77 y.o. yo Female with a problem list including...  Patient Active Problem List   Diagnosis Date Noted   Diverticulitis of colon with perforation 07/31/2023   Acute pain of right shoulder 07/06/2023   History of GI bleed 12/05/2022   S/P repair of paraesophageal hernia 08/26/2022   Hiatal hernia 07/26/2022   Anemia of chronic disease 04/15/2022   H/O sarcoidosis 01/29/2022   Ganglion cyst of wrist, left 01/29/2022   Lumbar degenerative disc disease 06/07/2020   Lumbar spondylosis 06/07/2020   Chronic pain syndrome 04/18/2020   Sacroiliac joint pain 04/18/2020   Chronic heart failure with preserved ejection fraction (HFpEF) (HCC) 01/11/2020   Grade I diastolic dysfunction 12/15/2019   History of CVA (cerebrovascular accident) 12/01/2019   Hyperparathyroidism due to renal insufficiency (HCC) 08/31/2019   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   Advanced care planning/counseling discussion 04/13/2017   Post herpetic neuralgia 10/10/2016   Hip bursitis 09/18/2015   Poorly controlled type 2 diabetes mellitus with neuropathy (HCC) 05/01/2015   Hyperlipidemia associated with type 2 diabetes mellitus (HCC) 05/01/2015   CKD (chronic kidney disease) stage 4, GFR 15-29 ml/min (HCC) 05/01/2015   Encounter for long-term (current) use of insulin (HCC) 05/01/2015    presents to clinic for follow-up evaluation of left lower quadrant drain of fistula following her complicated diverticular abscess, progressing adequately, though has resorted to her old habits of not moving her bowels, or utilizing sufficient laxatives to help with bowel activity.  Plan:   -Appreciate IR's follow-up for evaluation of her fistula, we will attempt to reschedule her follow-up as this seemed to have slipped through the cracks.  This will help to determine the need for her continued drain.              - return to clinic in 3 weeks or as  needed, instructed to call office if any questions or concerns  All of the above recommendations were discussed with the patient and patient's family, and all of patient's and family's questions were answered to their expressed satisfaction.  These notes generated with voice recognition software. I apologize for typographical errors.  Campbell Lerner, MD, FACS [place]: [facility] General Surgery - Partnering for exceptional care. Office: [phone]

## 2023-09-07 ENCOUNTER — Other Ambulatory Visit: Payer: Medicare Other

## 2023-09-07 ENCOUNTER — Other Ambulatory Visit: Payer: Self-pay

## 2023-09-07 NOTE — Patient Instructions (Signed)
Visit Information  Thank you for taking time to visit with me today. Please don't hesitate to contact me if I can be of assistance to you before our next scheduled telephone appointment.  Following are the goals we discussed today:   Goals Addressed             This Visit's Progress    RNCM Care Management Expected Outcome:  Monitor, Self-Manage and Reduce Symptoms of Diabetes       Current Barriers:  Knowledge Deficits related to the importance of normalized blood sugars and A1C WNL to prevent possible complications with kidneys and other body systems  Chronic Disease Management support and education needs related to effective management of DM Lab Results  Component Value Date   HGBA1C 8.8 (H) 07/31/2023  Last A1C was 8.5% in April 2024. New one in July 16, 2023 with Endocrinology - 8.2  Planned Interventions: Provided education to patient about basic DM disease process. The patient saw the endocrinologist in August but then was in the hospital for a diverticulitis abscess.  She states that her numbers are better now, but she is out of sensors. She run out because she kept having to have CT scans. She will have another one on 09-09-2023 and hopefully they will be able to remove the drain. She will see the provider coming up on 09-09-2023. Reviewed medications with patient and discussed importance of medication adherence. States compliance with medications. Denies any medication needs at this time. Denies any issues with medication compliance. She is having trouble having regular bowel movements. Discussed good bowel habits and OTC products that have worked well for her in the past.   ;        Reviewed prescribed diet with patient heart healthy/ADA diet. Review of dietary restrictions. Education on monitoring for changes and foods that cause her blood sugars to be out of range. ; Counseled on importance of regular laboratory monitoring as prescribed. Is having her A1C through endocrinologist  at this time;        Discussed plans with patient for ongoing care management follow up and provided patient with direct contact information for care management team;      Provided patient with written educational materials related to hypo and hyperglycemia and importance of correct treatment. Denies any lows. Per her continuous reader her readings have been 78%  in range and her target is 70-140. The lowest she has seen recently was 69 and the highest 184;       Reviewed scheduled/upcoming provider appointments including: 12-18-2023 at 2 pm, saw pcp on 06-15-2023. Knows to call for changes or new needs. Is seeing specialist on a regular basis. Next appointment on 09-09-2023;         Advised patient, providing education and rationale, to check cbg once daily and when you have symptoms of low or high blood sugar and record. The patient states that her blood sugars have been good but she is currently out of her sensors. She gave several readings. Review of goal of A1C   08-21-2023 0922 am-184 1800- 133  08-20-2023 0820 am -130 126  08-19-2023 0947 am 69   08-18-2023 0953 am [phone]  08-17-2023 0952 am 74    call provider for findings outside established parameters;       Review of patient status, including review of consultants reports, relevant laboratory and other test results, and medications completed;       Advised patient to discuss changes in DM, questions,  and concerns with provider. She states that she has not had an appetite and has not really wanted a lot to eat since being in the hospital. Education on eating lots of acceptable fruits and vegetables, and drinking plenty of water. She has a list of foods she cannot eat and she knows to stay away from those things that have seeds or nuts in them. ;      Screening for signs and symptoms of depression related to chronic disease state;        Assessed social determinant of health barriers;        The patient has had a new onset of dizziness recently and  has been changing her position slowly. Assessment completed. The patient denies any issues with low blood sugars. The patient states that her sugars have not been low. The patient has not taken  her blood pressure when the dizzy spells occur. Education on changing position slowly and when it is safe to do so check her blood sugars and blood pressure. Education on orthostatic hypotension. Collaboration with the pcp and advised the patient it was safe to take OTC meclizine for dizziness. Review of safety concerns and to call the office for changes. Also will attach Epleys maneuver information in myChart for the patient to try to see if this will help for her dizziness. The patient has had a fall since last outreach and has not tried Epley's maneuver. Will attach information in myChart again. The patient states that she still has dizziness but not as bad. She is mindful of changing position slowly and she knows to be safe. Denies any falls or safety concerns.  Symptom Management: Take medications as prescribed   Attend all scheduled provider appointments Call provider office for new concerns or questions  call the Suicide and Crisis Lifeline: 988 call the [place] National Suicide Prevention Lifeline: 803-721-5016 or TTY: 518-702-0835 TTY [phone]) to talk to a trained counselor call 1-800-273-TALK (toll free, 24 hour hotline) if experiencing a Mental Health or Behavioral Health Crisis  check blood sugar at prescribed times: once daily and when you have symptoms of low or high blood sugar check feet daily for cuts, sores or redness trim toenails straight across manage portion size wash and dry feet carefully every day wear comfortable, cotton socks wear comfortable, well-fitting shoes  Follow Up Plan: Telephone follow up appointment with care management team member scheduled for: 10-27-2023 at 345 pm       RNCM Care Management:  Expected Outcome:  Monitor, Self-Manage and Reduce Symptoms of Heart  Failure       Current Barriers:  Knowledge Deficits related to monitoring for fluid shifts and wearing compression hose to help with extra fluid on board in legs and feet Chronic Disease Management support and education needs related to effective management of HF Wt Readings from Last 3 Encounters:  09/03/23 133 lb 3.2 oz (60.4 kg)  08/13/23 136 lb 9.6 oz (62 kg)  08/12/23 139 lb (63 kg)    Lab Results  Component Value Date   NA 140 08/05/2023   CL 114 (H) 08/05/2023   K 4.4 08/05/2023   CO2 21 (L) 08/05/2023   BUN 34 (H) 08/05/2023   CREATININE 2.25 (H) 08/05/2023   GFRNONAA 22 (L) 08/05/2023   CALCIUM 8.0 (L) 08/05/2023   ALBUMIN 3.4 (L) 07/31/2023   GLUCOSE 108 (H) 08/05/2023     Planned Interventions: Basic overview and discussion of pathophysiology of Heart Failure reviewed. The patients weight is staying  stable. Denies any issues with fluid overload, swelling or edema in her feet and legs. The patient sees cardiologist on a regular basis. Review of CKD and eGFR of 22. 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 She still have a drain but is hopeful to have it removed soon. The patient denies any acute fluid shifts. The patient denies any new concerns related to her heart failure.  Provided education on low sodium diet. Patient states she has not had an appetite but it seems to be getting better than what it was. Education and support given Reviewed Heart Failure Action Plan in depth and provided written copy. The patient states she has not had swelling in her feet and legs in a while. She states that she is doing well and denies any acute findings today. She has not been having to wear her compression stocking and states her legs have been "flat". Education and support given Assessed need for readable accurate scales in home Provided education about placing scale on hard, flat surface Advised patient to weigh each morning after  emptying bladder Discussed importance of daily weight and advised patient to weigh and record daily Reviewed role of diuretics in prevention of fluid overload and management of heart failure Discussed the importance of keeping all appointments with provider Provided patient with education about the role of exercise in the management of heart failure Advised patient to discuss changes in HF or heart health with provider Screening for signs and symptoms of depression related to chronic disease state  Assessed social determinant of health barriers EF is 60-65%  Symptom Management: Take medications as prescribed   Attend all scheduled provider appointments Call provider office for new concerns or questions  call the Suicide and Crisis Lifeline: 988 call the [place] National Suicide Prevention Lifeline: 906 462 9180 or TTY: (959)779-4247 TTY [phone]) to talk to a trained counselor call 1-800-273-TALK (toll free, 24 hour hotline) if experiencing a Mental Health or Behavioral Health Crisis  call office if I gain more than 2 pounds in one day or 5 pounds in one week use salt in moderation watch for swelling in feet, ankles and legs every day weigh myself daily develop a rescue plan follow rescue plan if symptoms flare-up track symptoms and what helps feel better or worse dress right for the weather, hot or cold  Follow Up Plan: Telephone follow up appointment with care management team member scheduled for: 10-27-2023 at 345 pm       RNCM Expected Outcome:  Monitor, Self-Manage, and Reduce Symptoms of Hypertension       Current Barriers:  Knowledge Deficits related to checking blood pressures on a regular bases to pick up on trends and changes that could impact overall health and well being Chronic Disease Management support and education needs related to effective management of HTN  BP Readings from Last 3 Encounters:  09/03/23 (!) 168/90  08/13/23 (!) 187/82  08/12/23 134/80      Planned Interventions: Evaluation of current treatment plan related to hypertension self management and patient's adherence to plan as established by provider. The patient has been having elevations of her blood pressures. She states that when she went to the doctor recently she had not taken her medications.  Education provided.  Provided education to patient re: stroke prevention, s/s of heart attack and stroke. Review of the goal of systolic blood pressures <150 and diastolic <90. Education on making sure she takes her medications  before going to her MD appointments. She states the last couple times that she had not taken her medications and she feels this is the reason it was elevated. ; Reviewed prescribed diet heart healthy/ADA diet. The patient states since being in the hospital and having the abscess she has not had an appetite. Education provided and review of dietary restrictions. She states that she has a paper with the foods she is to avoid.  Reviewed medications with patient and discussed importance of compliance. The patient states compliance with medications. ;  Discussed plans with patient for ongoing care management follow up and provided patient with direct contact information for care management team; Advised patient, providing education and rationale, to monitor blood pressure daily and record, calling PCP for findings outside established parameters;  Reviewed scheduled/upcoming provider appointments including: 12-18-2023 at 2 pm Advised patient to discuss changes in HTN or heart health with provider; Provided education on prescribed diet heart healthy/ADA diet ;  Discussed complications of poorly controlled blood pressure such as heart disease, stroke, circulatory complications, vision complications, kidney impairment, sexual dysfunction;  Collaboration with the pcp about the new onset of dizziness the patient is having. The patient states that she has noticed this recently when she  gets up from sitting or changes position. Review of Epley's maneuver and OTC medications as recommended by the pcp. Screening for signs and symptoms of depression related to chronic disease state;  Assessed social determinant of health barriers;   Symptom Management: Take medications as prescribed   Attend all scheduled provider appointments Call provider office for new concerns or questions  call the Suicide and Crisis Lifeline: 988 call the [place] National Suicide Prevention Lifeline: 807-377-5037 or TTY: 774-844-9715 TTY [phone]) to talk to a trained counselor call 1-800-273-TALK (toll free, 24 hour hotline) if experiencing a Mental Health or Behavioral Health Crisis  check blood pressure weekly learn about high blood pressure keep a blood pressure log take blood pressure log to all doctor appointments call doctor for signs and symptoms of high blood pressure develop an action plan for high blood pressure keep all doctor appointments take medications for blood pressure exactly as prescribed report new symptoms to your doctor  Follow Up Plan: Telephone follow up appointment with care management team member scheduled for: 10-27-2023 at 345 pm           Our next appointment is by telephone on 10-27-2023 at 345 pm  Please call the care guide team at [phone] if you need to cancel or reschedule your appointment.   If you are experiencing a Mental Health or Behavioral Health Crisis or need someone to talk to, please call the Suicide and Crisis Lifeline: 988 call the [place] National Suicide Prevention Lifeline: 317-365-0696 or TTY: 859-130-7772 TTY 571 331 9368) to talk to a trained counselor call 1-800-273-TALK (toll free, 24 hour hotline)   Patient verbalizes understanding of instructions and care plan provided today and agrees to view in MyChart. Active MyChart status and patient understanding of how to access instructions and care plan via MyChart confirmed with  patient.     Telephone follow up appointment with care management team member scheduled for: 10-27-2023 at 345 pm  Alto Denver RN, MSN, CCM RN Care Manager  [facility]  Ambulatory Care Management  Direct Number: [phone]

## 2023-09-07 NOTE — Progress Notes (Signed)
Referring Physician(s): Rodenberg,Denny  Chief Complaint: The patient is seen in follow up today s/p diverticular abscess drain placed 08/03/23  History of present illness: Tonya [name] is a 77 y.o. female with past medical history of anemia, arthritis, cardiomyopathy, CKD, GERD, HTN, DM diverticulitis who presented to [facility] ED 07/31/23 with acute abdominal pain. CT showed perforated diverticulitis with abscess collection and she was referred to IR for drain placement. On 07/31/23 she received a 10 Fr LLQ drain. She was discharged home 8/22 and followed up at our outpatient clinic 08/18/23. CT imaging showed near complete resolution of the abscess but drain injection under fluoroscopy demonstrated a fistulous communication with the colon. The drain was switched from bulb to gravity bag and she discontinued drain flushes.   She followed up with Dr. Claudine Mouton with General Surgery 09/03/23. She has been struggling with constipation and she has been encouraged to take her stool softeners and laxatives more regularly.  She presents to the clinic today for a repeat drain evaluation. No fevers, chills, abdominal pain. No change in bowel habits.  She has had trace, light tan output in the bag over the past week.    Past Medical History:  Diagnosis Date   (HFpEF) heart failure with preserved ejection fraction (HCC)    a.) TTE 05/29/2017: EF 55-60%, mild LAE, G1DD; b.) TTE 09/07/2017: EF >55%, mild LAE, Ao sclerosis without stenosis, degen MV disease, G2DD; c.) TTE 12/02/2018: EF 55-60%, mild LVH, Mild BAE, triv TR, G1DD; d.) TTE 11/22/2019: EF 60-65%, mod LVH, mild TR, G1DD   Anemia in stage 4 chronic kidney disease (HCC)    Arthritis    Cardiomyopathy (HCC)    CKD (chronic kidney disease) stage 4, GFR 15-29 ml/min (HCC)    CVA (cerebral vascular accident) (HCC) 08/2017   a.) no residual deficits   Diabetic neuropathy (HCC)    Diverticulitis    GERD (gastroesophageal reflux disease)    History of  hiatal hernia    Hyperlipidemia    Hypertension    Long term current use of antithrombotics/antiplatelets    a.) DAPT therapy (ASA + clopidogrel)   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)    Paraesophageal hernia    PHN (postherpetic neuralgia)    Sarcoidosis    Shingles    Type 2 diabetes mellitus treated with insulin (HCC)    Wears dentures    partial upper    Past Surgical History:  Procedure Laterality Date   CATARACT EXTRACTION W/PHACO Left 02/05/2021   Procedure: CATARACT EXTRACTION PHACO AND INTRAOCULAR LENS PLACEMENT (IOC) LEFT DIABETIC 5.47 00:51.4;  Surgeon: Galen Manila, MD;  Location: [facility];  Service: Ophthalmology;  Laterality: Left;  Diabetic - insulin and oral meds   CATARACT EXTRACTION W/PHACO Right 03/19/2021   Procedure: CATARACT EXTRACTION PHACO AND INTRAOCULAR LENS PLACEMENT (IOC) RIGHT DIABETIC 6.09 00:39.6;  Surgeon: Galen Manila, MD;  Location: [facility];  Service: Ophthalmology;  Laterality: Right;   COLONOSCOPY  12/15/2006   COLONOSCOPY WITH PROPOFOL N/A 06/06/2019   Procedure: COLONOSCOPY WITH PROPOFOL;  Surgeon: Wyline Mood, MD;  Location: [facility];  Service: Gastroenterology;  Laterality: N/A;   COLONOSCOPY WITH PROPOFOL N/A 01/16/2020   Procedure: COLONOSCOPY WITH BIOPSY;  Surgeon: Wyline Mood, MD;  Location: [facility];  Service: Endoscopy;  Laterality: N/A;  Diabetic - insulin and oral meds   ESOPHAGOGASTRODUODENOSCOPY (EGD) WITH PROPOFOL N/A 06/23/2022   Procedure: ESOPHAGOGASTRODUODENOSCOPY (EGD) WITH PROPOFOL;  Surgeon: Wyline Mood, MD;  Location: [facility] ENDOSCOPY;  Service: Gastroenterology;  Laterality: N/A;   EYE SURGERY     INSERTION OF MESH  08/26/2022   Procedure: INSERTION OF MESH;  Surgeon: Leafy Ro, MD;  Location: [facility];  Service: General;;   IR RADIOLOGIST EVAL & MGMT  08/18/2023   POLYPECTOMY N/A 01/16/2020   Procedure: POLYPECTOMY;  Surgeon: Wyline Mood, MD;   Location: [facility];  Service: Endoscopy;  Laterality: N/A;   TUBAL LIGATION     XI ROBOTIC ASSISTED PARAESOPHAGEAL HERNIA REPAIR N/A 08/26/2022   Procedure: XI ROBOTIC ASSISTED PARAESOPHAGEAL HERNIA REPAIR, RNFA to assist;  Surgeon: Leafy Ro, MD;  Location: [facility];  Service: General;  Laterality: N/A;    Allergies: Atorvastatin, Gabapentin, Pravastatin, Pregabalin, and Trulicity [dulaglutide]  Medications: Prior to Admission medications   Medication Sig Start Date End Date Taking? Authorizing Provider  acetaminophen (TYLENOL) 650 MG CR tablet Take 650 mg by mouth every 8 (eight) hours as needed for pain.    [provider]  amoxicillin-clavulanate (AUGMENTIN) 500-125 MG tablet Take 1 tablet by mouth every 12 (twelve) hours. 08/06/23   Campbell Lerner, MD  aspirin 81 MG tablet Take 81 mg by mouth daily.    [provider]  carvedilol (COREG) 3.125 MG tablet Take 1 tablet (3.125 mg total) by mouth 2 (two) times daily with a meal. Overdue yearly follow up.  PLEASE CALL OFFICE TO SCHEDULE APPOINTMENT PRIOR TO NEXT REFILL (first attempt) 08/19/23   Antonieta Iba, MD  diclofenac Sodium (VOLTAREN) 1 % GEL Apply 4 g topically 4 (four) times daily. 07/06/23   Pearley, Sherran Needs, NP  docusate sodium (COLACE) 100 MG capsule Take 100 mg by mouth daily as needed for mild constipation.    [provider]  gabapentin (NEURONTIN) 100 MG capsule Take 1 capsule (100 mg total) by mouth at bedtime. 02/25/23   Cannady, Corrie Dandy T, NP  HYDROcodone-acetaminophen (NORCO/VICODIN) 5-325 MG tablet Take 1-2 tablets by mouth every 4 (four) hours as needed for moderate pain or severe pain. 08/06/23   Campbell Lerner, MD  insulin lispro (HUMALOG) 100 UNIT/ML injection Inject 8 Units into the skin 3 (three) times daily before meals.    [provider]  meclizine (ANTIVERT) 12.5 MG tablet Take 12.5 mg by mouth 3 (three) times daily. 12/22/22   [provider]   naloxone [facility]) nasal spray 4 mg/0.1 mL  08/27/22   [provider]  NOVOFINE 32G X 6 MM MISC USE AS DIRECTED THREE TIMES DAILY WITH HUMALOG 06/19/20   Cannady, [name] T, NP  olmesartan (BENICAR) 40 MG tablet Take 1 tablet (40 mg total) by mouth daily. Patient taking differently: Take 40 mg by mouth every morning. 08/07/20   Cannady, Corrie Dandy T, NP  pantoprazole (PROTONIX) 40 MG tablet Take 1 tablet (40 mg total) by mouth daily. 01/28/23   Pabon, Diego F, MD  pioglitazone (ACTOS) 30 MG tablet Take 30 mg by mouth daily.    [provider]  polyethylene glycol (MIRALAX / GLYCOLAX) 17 g packet Take 17 g by mouth 2 (two) times daily. 08/06/23   Campbell Lerner, MD  rosuvastatin (CRESTOR) 5 MG tablet Take 1 tablet (5 mg total) by mouth daily. Overdue yearly follow up.  PLEASE CALL OFFICE TO SCHEDULE APPOINTMENT PRIOR TO NEXT REFILL (first attempt) 08/19/23   Antonieta Iba, MD  simethicone (MYLICON) 80 MG chewable tablet Chew 1 tablet (80 mg total) by mouth 4 (four) times daily as needed for flatulence. 08/06/23   Campbell Lerner, MD  torsemide (DEMADEX) 20 MG tablet Take 2 tablets (40 mg total) by mouth daily. Patient taking differently: Take 40 mg by mouth every morning. 07/01/22   Antonieta Iba, MD  vitamin B-12 (CYANOCOBALAMIN) 100 MCG tablet Take 100 mcg by mouth daily.    [provider]  Vitamin D, Cholecalciferol, 50 MCG (2000 UT) CAPS Take 2,000 Units by mouth daily.    [provider]     Family History  Problem Relation Age of Onset   Diabetes Mother    Stroke Mother    Hyperlipidemia Father    Hypertension Father    Diabetes Sister    Diabetes Brother    Gout Son    Diabetes Brother    Kidney disease Son    Lymphoma Sister    Heart attack Sister    Breast cancer Neg Hx     Social History   Socioeconomic History   Marital status: Married    Spouse name: Not on file   Number of children: Not on file   Years of education: Not on file    Highest education level: GED or equivalent  Occupational History   Occupation: retired  Tobacco Use   Smoking status: Never    Passive exposure: Never   Smokeless tobacco: Never  Vaping Use   Vaping status: Never Used  Substance and Sexual Activity   Alcohol use: No    Alcohol/week: 0.0 standard drinks of alcohol   Drug use: No   Sexual activity: Yes  Other Topics Concern   Not on file  Social History Narrative   Not on file   Social Determinants of Health   Financial Resource Strain: High Risk (04/13/2023)   Overall Financial Resource Strain (CARDIA)    Difficulty of Paying Living Expenses: Hard  Food Insecurity: No Food Insecurity (08/10/2023)   Hunger Vital Sign    Worried About Running Out of Food in the Last Year: Never true    Ran Out of Food in the Last Year: Never true  Transportation Needs: No Transportation Needs (08/01/2023)   PRAPARE - Administrator, Civil Service (Medical): No    Lack of Transportation (Non-Medical): No  Physical Activity: Inactive (02/17/2023)   Exercise Vital Sign    Days of Exercise per Week: 0 days    Minutes of Exercise per Session: 0 min  Stress: No Stress Concern Present (02/17/2023)   [employer] of Occupational Health - Occupational Stress Questionnaire    Feeling of Stress : Not at all  Social Connections: Moderately Isolated (02/17/2023)   Social Connection and Isolation Panel [NHANES]    Frequency of Communication with Friends and Family: More than three times a week    Frequency of Social Gatherings with Friends and Family: Three times a week    Attends Religious Services: Never    Active Member of Clubs or Organizations: No    Attends Banker Meetings: Never    Marital Status: Married     Vital Signs: LMP  (LMP Unknown)   Physical Exam Constitutional:      General: She is not in acute distress. HENT:     Head: Normocephalic.     Mouth/Throat:     Mouth: Mucous membranes are moist.  Eyes:      General: No scleral icterus. Cardiovascular:     Rate and Rhythm: Normal rate.  Pulmonary:     Effort: Pulmonary effort is normal.  Abdominal:     General: There is no distension.  Comments: LLQ drain in place, trace tan fluid in drainage bag.  Skin:    General: Skin is warm and dry.     Coloration: Skin is not jaundiced.  Neurological:     Mental Status: She is alert and oriented to person, place, and time.     Imaging: CT AP 07/31/23   CT/Drain check 08/18/23    Labs:  CBC: Recent Labs    08/02/23 0403 08/03/23 0454 08/04/23 0545 08/05/23 0446 08/11/23 1249  WBC 7.4 6.7 6.6 7.7  --   HGB 8.3* 8.4* 9.0* 9.2* 9.1*  HCT 26.1* 25.9* 29.5* 29.7* 29.2*  PLT 198 220 254 290  --     COAGS: No results for input(s): "INR", "APTT" in the last 8760 hours.  BMP: Recent Labs    08/02/23 0403 08/03/23 0454 08/04/23 0545 08/05/23 0446  NA 139 140 140 140  K 4.1 4.1 4.5 4.4  CL 109 110 110 114*  CO2 21* 24 21* 21*  GLUCOSE 83 100* 128* 108*  BUN 57* 50* 43* 34*  CALCIUM 7.7* 7.9* 8.1* 8.0*  CREATININE 2.60* 2.71* 2.51* 2.25*  GFRNONAA 19* 18* 19* 22*    LIVER FUNCTION TESTS: Recent Labs    12/22/22 1600 07/31/23 1044  BILITOT <0.2 0.4  AST 14 18  ALT 7 14  ALKPHOS 62 64  PROT 5.5* 6.7  ALBUMIN 3.5* 3.4*    Assessment and Plan: 77 year old female with a history of perforated diverticulitis with abscess and drain placement in IR 08/03/23. Repeat drain evaluation 08/18/23 showed resolution of the abscess but with a fistulous connection to the bowel. The drain was switched from a bulb to a gravity bag and the patient was instructed to discontinue flushing the drain.   CT today shows partial retraction of the drain without pericolonic abscess.  Fluoroscopic evaluation demonstrates no evidence of persistent colonic fistula.  The drain was removed intact.  Follow up with Surgery as scheduled. Follow up with IR as needed.  Marliss Coots, MD Pager:  [phone]    I spent a total of 40 Minutes in face to face in clinical consultation, greater than 50% of which was counseling/coordinating care for diverticula abscess drain.

## 2023-09-07 NOTE — Patient Outreach (Signed)
Care Management   Visit Note  09/07/2023 Name: Tonya [name] MRN: [id number] DOB: [birth year]  Subjective: Tonya [name] is a 77 y.o. year old female who is a primary care patient of Cannady, Dorie Rank, NP. The Care Management team was consulted for assistance.      Engaged with patient spoke with patient by telephone.    Goals Addressed             This Visit's Progress    RNCM Care Management Expected Outcome:  Monitor, Self-Manage and Reduce Symptoms of Diabetes       Current Barriers:  Knowledge Deficits related to the importance of normalized blood sugars and A1C WNL to prevent possible complications with kidneys and other body systems  Chronic Disease Management support and education needs related to effective management of DM Lab Results  Component Value Date   HGBA1C 8.8 (H) 07/31/2023  Last A1C was 8.5% in April 2024. New one in July 16, 2023 with Endocrinology - 8.2  Planned Interventions: Provided education to patient about basic DM disease process. The patient saw the endocrinologist in August but then was in the hospital for a diverticulitis abscess.  She states that her numbers are better now, but she is out of sensors. She run out because she kept having to have CT scans. She will have another one on 09-09-2023 and hopefully they will be able to remove the drain. She will see the provider coming up on 09-09-2023. Reviewed medications with patient and discussed importance of medication adherence. States compliance with medications. Denies any medication needs at this time. Denies any issues with medication compliance. She is having trouble having regular bowel movements. Discussed good bowel habits and OTC products that have worked well for her in the past.   ;        Reviewed prescribed diet with patient heart healthy/ADA diet. Review of dietary restrictions. Education on monitoring for changes and foods that cause her blood sugars to be out of range. ; Counseled on  importance of regular laboratory monitoring as prescribed. Is having her A1C through endocrinologist at this time;        Discussed plans with patient for ongoing care management follow up and provided patient with direct contact information for care management team;      Provided patient with written educational materials related to hypo and hyperglycemia and importance of correct treatment. Denies any lows. Per her continuous reader her readings have been 78%  in range and her target is 70-140. The lowest she has seen recently was 69 and the highest 184;       Reviewed scheduled/upcoming provider appointments including: 12-18-2023 at 2 pm, saw pcp on 06-15-2023. Knows to call for changes or new needs. Is seeing specialist on a regular basis. Next appointment on 09-09-2023;         Advised patient, providing education and rationale, to check cbg once daily and when you have symptoms of low or high blood sugar and record. The patient states that her blood sugars have been good but she is currently out of her sensors. She gave several readings. Review of goal of A1C   08-21-2023 0922 am-184 1800- 133  08-20-2023 0820 am -130 126  08-19-2023 0947 am 69   08-18-2023 0953 am [phone]  08-17-2023 0952 am 74    call provider for findings outside established parameters;       Review of patient status, including review of consultants reports, relevant laboratory and  other test results, and medications completed;       Advised patient to discuss changes in DM, questions, and concerns with provider. She states that she has not had an appetite and has not really wanted a lot to eat since being in the hospital. Education on eating lots of acceptable fruits and vegetables, and drinking plenty of water. She has a list of foods she cannot eat and she knows to stay away from those things that have seeds or nuts in them. ;      Screening for signs and symptoms of depression related to chronic disease state;        Assessed  social determinant of health barriers;        The patient has had a new onset of dizziness recently and has been changing her position slowly. Assessment completed. The patient denies any issues with low blood sugars. The patient states that her sugars have not been low. The patient has not taken  her blood pressure when the dizzy spells occur. Education on changing position slowly and when it is safe to do so check her blood sugars and blood pressure. Education on orthostatic hypotension. Collaboration with the pcp and advised the patient it was safe to take OTC meclizine for dizziness. Review of safety concerns and to call the office for changes. Also will attach Epleys maneuver information in myChart for the patient to try to see if this will help for her dizziness. The patient has had a fall since last outreach and has not tried Epley's maneuver. Will attach information in myChart again. The patient states that she still has dizziness but not as bad. She is mindful of changing position slowly and she knows to be safe. Denies any falls or safety concerns.  Symptom Management: Take medications as prescribed   Attend all scheduled provider appointments Call provider office for new concerns or questions  call the Suicide and Crisis Lifeline: 988 call the [place] National Suicide Prevention Lifeline: 517-276-8250 or TTY: (657) 418-8639 TTY 636-235-3005) to talk to a trained counselor call 1-800-273-TALK (toll free, 24 hour hotline) if experiencing a Mental Health or Behavioral Health Crisis  check blood sugar at prescribed times: once daily and when you have symptoms of low or high blood sugar check feet daily for cuts, sores or redness trim toenails straight across manage portion size wash and dry feet carefully every day wear comfortable, cotton socks wear comfortable, well-fitting shoes  Follow Up Plan: Telephone follow up appointment with care management team member scheduled for: 10-27-2023 at  345 pm       RNCM Care Management:  Expected Outcome:  Monitor, Self-Manage and Reduce Symptoms of Heart Failure       Current Barriers:  Knowledge Deficits related to monitoring for fluid shifts and wearing compression hose to help with extra fluid on board in legs and feet Chronic Disease Management support and education needs related to effective management of HF Wt Readings from Last 3 Encounters:  09/03/23 133 lb 3.2 oz (60.4 kg)  08/13/23 136 lb 9.6 oz (62 kg)  08/12/23 139 lb (63 kg)    Lab Results  Component Value Date   NA 140 08/05/2023   CL 114 (H) 08/05/2023   K 4.4 08/05/2023   CO2 21 (L) 08/05/2023   BUN 34 (H) 08/05/2023   CREATININE 2.25 (H) 08/05/2023   GFRNONAA 22 (L) 08/05/2023   CALCIUM 8.0 (L) 08/05/2023   ALBUMIN 3.4 (L) 07/31/2023   GLUCOSE 108 (H) 08/05/2023  Planned Interventions: Basic overview and discussion of pathophysiology of Heart Failure reviewed. The patients weight is staying stable. Denies any issues with fluid overload, swelling or edema in her feet and legs. The patient sees cardiologist on a regular basis. Review of CKD and eGFR of 22. 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 She still have a drain but is hopeful to have it removed soon. The patient denies any acute fluid shifts. The patient denies any new concerns related to her heart failure.  Provided education on low sodium diet. Patient states she has not had an appetite but it seems to be getting better than what it was. Education and support given Reviewed Heart Failure Action Plan in depth and provided written copy. The patient states she has not had swelling in her feet and legs in a while. She states that she is doing well and denies any acute findings today. She has not been having to wear her compression stocking and states her legs have been "flat". Education and support given Assessed need for readable accurate scales in  home Provided education about placing scale on hard, flat surface Advised patient to weigh each morning after emptying bladder Discussed importance of daily weight and advised patient to weigh and record daily Reviewed role of diuretics in prevention of fluid overload and management of heart failure Discussed the importance of keeping all appointments with provider Provided patient with education about the role of exercise in the management of heart failure Advised patient to discuss changes in HF or heart health with provider Screening for signs and symptoms of depression related to chronic disease state  Assessed social determinant of health barriers EF is 60-65%  Symptom Management: Take medications as prescribed   Attend all scheduled provider appointments Call provider office for new concerns or questions  call the Suicide and Crisis Lifeline: 988 call the [place] National Suicide Prevention Lifeline: 225-829-7984 or TTY: 540-169-1771 TTY 9733789025) to talk to a trained counselor call 1-800-273-TALK (toll free, 24 hour hotline) if experiencing a Mental Health or Behavioral Health Crisis  call office if I gain more than 2 pounds in one day or 5 pounds in one week use salt in moderation watch for swelling in feet, ankles and legs every day weigh myself daily develop a rescue plan follow rescue plan if symptoms flare-up track symptoms and what helps feel better or worse dress right for the weather, hot or cold  Follow Up Plan: Telephone follow up appointment with care management team member scheduled for: 10-27-2023 at 345 pm       RNCM Expected Outcome:  Monitor, Self-Manage, and Reduce Symptoms of Hypertension       Current Barriers:  Knowledge Deficits related to checking blood pressures on a regular bases to pick up on trends and changes that could impact overall health and well being Chronic Disease Management support and education needs related to effective management of  HTN  BP Readings from Last 3 Encounters:  09/03/23 (!) 168/90  08/13/23 (!) 187/82  08/12/23 134/80     Planned Interventions: Evaluation of current treatment plan related to hypertension self management and patient's adherence to plan as established by provider. The patient has been having elevations of her blood pressures. She states that when she went to the doctor recently she had not taken her medications.  Education provided.  Provided education to patient re: stroke prevention, s/s of heart attack and stroke. Review of the  goal of systolic blood pressures <150 and diastolic <90. Education on making sure she takes her medications before going to her MD appointments. She states the last couple times that she had not taken her medications and she feels this is the reason it was elevated. ; Reviewed prescribed diet heart healthy/ADA diet. The patient states since being in the hospital and having the abscess she has not had an appetite. Education provided and review of dietary restrictions. She states that she has a paper with the foods she is to avoid.  Reviewed medications with patient and discussed importance of compliance. The patient states compliance with medications. ;  Discussed plans with patient for ongoing care management follow up and provided patient with direct contact information for care management team; Advised patient, providing education and rationale, to monitor blood pressure daily and record, calling PCP for findings outside established parameters;  Reviewed scheduled/upcoming provider appointments including: 12-18-2023 at 2 pm Advised patient to discuss changes in HTN or heart health with provider; Provided education on prescribed diet heart healthy/ADA diet ;  Discussed complications of poorly controlled blood pressure such as heart disease, stroke, circulatory complications, vision complications, kidney impairment, sexual dysfunction;  Collaboration with the pcp about the  new onset of dizziness the patient is having. The patient states that she has noticed this recently when she gets up from sitting or changes position. Review of Epley's maneuver and OTC medications as recommended by the pcp. Screening for signs and symptoms of depression related to chronic disease state;  Assessed social determinant of health barriers;   Symptom Management: Take medications as prescribed   Attend all scheduled provider appointments Call provider office for new concerns or questions  call the Suicide and Crisis Lifeline: 988 call the [place] National Suicide Prevention Lifeline: (940)009-6281 or TTY: 279-415-3468 TTY 845 291 8717) to talk to a trained counselor call 1-800-273-TALK (toll free, 24 hour hotline) if experiencing a Mental Health or Behavioral Health Crisis  check blood pressure weekly learn about high blood pressure keep a blood pressure log take blood pressure log to all doctor appointments call doctor for signs and symptoms of high blood pressure develop an action plan for high blood pressure keep all doctor appointments take medications for blood pressure exactly as prescribed report new symptoms to your doctor  Follow Up Plan: Telephone follow up appointment with care management team member scheduled for: 10-27-2023 at 345 pm           Consent to Services:  Patient was given information about care management services, agreed to services, and gave verbal consent to participate.   Plan: Telephone follow up appointment with care management team member scheduled for: 10-27-2023 at 345 pm  Alto Denver RN, MSN, CCM RN Care Manager  [facility]  Ambulatory Care Management  Direct Number: [phone]

## 2023-09-09 ENCOUNTER — Ambulatory Visit
Admission: RE | Admit: 2023-09-09 | Discharge: 2023-09-09 | Disposition: A | Discharge status: Home / Self Care | Payer: Medicare Other | Source: Ambulatory Visit | Attending: Surgery | Admitting: Surgery

## 2023-09-09 ENCOUNTER — Encounter: Payer: Self-pay | Admitting: Oncology

## 2023-09-09 ENCOUNTER — Other Ambulatory Visit: Payer: Medicare Other

## 2023-09-09 DIAGNOSIS — Z4803 Encounter for change or removal of drains: Secondary | ICD-10-CM | POA: Diagnosis not present

## 2023-09-09 DIAGNOSIS — K632 Fistula of intestine: Secondary | ICD-10-CM | POA: Diagnosis not present

## 2023-09-09 DIAGNOSIS — K5720 Diverticulitis of large intestine with perforation and abscess without bleeding: Secondary | ICD-10-CM

## 2023-09-09 DIAGNOSIS — K449 Diaphragmatic hernia without obstruction or gangrene: Secondary | ICD-10-CM | POA: Diagnosis not present

## 2023-09-09 DIAGNOSIS — N200 Calculus of kidney: Secondary | ICD-10-CM | POA: Diagnosis not present

## 2023-09-09 HISTORY — PX: IR RADIOLOGIST EVAL & MGMT: IMG5224

## 2023-09-10 ENCOUNTER — Other Ambulatory Visit: Payer: Self-pay | Admitting: Cardiovascular Disease

## 2023-09-10 NOTE — Telephone Encounter (Signed)
Please contact pt for future appointment. Pt due for 12 month f/u.

## 2023-09-11 ENCOUNTER — Encounter: Payer: Self-pay | Admitting: Oncology

## 2023-09-11 NOTE — Telephone Encounter (Signed)
Please contact pt for future appointment. Pt overdue for 12 month f/u. 

## 2023-09-14 ENCOUNTER — Telehealth: Payer: Self-pay | Admitting: Cardiovascular Disease

## 2023-09-14 NOTE — Telephone Encounter (Signed)
Left voicemail to schedule appt from recall.

## 2023-09-14 NOTE — Telephone Encounter (Signed)
Left voice mail to schedule appt

## 2023-09-15 ENCOUNTER — Inpatient Hospital Stay: Payer: Medicare Other | Attending: Oncology

## 2023-09-15 ENCOUNTER — Inpatient Hospital Stay: Payer: Medicare Other

## 2023-09-15 ENCOUNTER — Encounter: Payer: Self-pay | Admitting: Cardiovascular Disease

## 2023-09-15 VITALS — BP 121/71

## 2023-09-15 DIAGNOSIS — N184 Chronic kidney disease, stage 4 (severe): Secondary | ICD-10-CM | POA: Diagnosis not present

## 2023-09-15 DIAGNOSIS — D631 Anemia in chronic kidney disease: Secondary | ICD-10-CM | POA: Insufficient documentation

## 2023-09-15 DIAGNOSIS — D638 Anemia in other chronic diseases classified elsewhere: Secondary | ICD-10-CM

## 2023-09-15 LAB — HEMOGLOBIN AND HEMATOCRIT, BLOOD
HCT: 31.4 % — ABNORMAL LOW (ref 36.0–46.0)
Hemoglobin: 9.6 g/dL — ABNORMAL LOW (ref 12.0–15.0)

## 2023-09-15 MED ORDER — EPOETIN ALFA-EPBX 10000 UNIT/ML IJ SOLN
10000.0000 [IU] | Freq: Once | INTRAMUSCULAR | Status: AC
  Administered 2023-09-15: 10000 [IU] via SUBCUTANEOUS
  Filled 2023-09-15: qty 1

## 2023-09-15 NOTE — Telephone Encounter (Signed)
Left voicemail, called 3x. Unable to reach letter being sent via mail.

## 2023-09-21 NOTE — Progress Notes (Unsigned)
Surgical Clinic Progress/Follow-up Note   HPI:  Follow-up for known colonic fistula, drain removed.  ***  Original drain placement on July 31, 2023.  Evaluated September 3, with repeat drain check was planned for 2 weeks after her evaluation.   She reports she continues to have issues with her bowels moving, and expects them to move after 1 dose of Colace or after a single dose of MiraLAX.  She reports her appetites not been that great.  But she denies any fevers, chills, nausea or vomiting.  She reports her pain is nothing like when she came in the hospital.  And is essentially a low-grade persistent tenderness in the drain site.  She was not aware of follow-up for the evaluated fistula earlier this month.   Review of Systems:  Constitutional: denies fever/chills  ENT: denies sore throat, hearing problems  Respiratory: denies shortness of breath, wheezing  Cardiovascular: denies chest pain, palpitations  Gastrointestinal: denies abdominal pain, N/V, or diarrhea/and bowel function as per interval history Skin: Denies any other rashes or skin discolorations except post-surgical wounds  Vital Signs:  LMP  (LMP Unknown)    Physical Exam:  Constitutional:  -- Normal body habitus  -- Awake, alert, and oriented x3  Pulmonary:  -- No crackles -- Equal breath sounds bilaterally -- Breathing non-labored at rest Cardiovascular:  -- RRR Gastrointestinal:  -- Soft and non-distended, non-tender/with mild peri-drain left lower quadrant tenderness to palpation, no guarding/rebound tenderness -- Dressing removed, drain site intact and well secured still.  Exit site is medial to the anchor.  No peri-drain erythema or induration, minimal purulent drainage on dressing.  Some of the catheter feels that it may have migrated to the subcutaneous space. -- No abdominal masses appreciated, pulsatile or otherwise   Musculoskeletal / Integumentary:  -- Wounds or skin discoloration: None appreciated --  Extremities: B/L UE and LE FROM, hands and feet warm, lower extremity edema present  Laboratory studies: CBC:  Lab Results  Component Value Date   WBC 7.7 08/05/2023   RBC 3.45 (L) 08/05/2023   BMP:  Lab Results  Component Value Date   GLUCOSE 108 (H) 08/05/2023   CO2 21 (L) 08/05/2023   BUN 34 (H) 08/05/2023   BUN 35 (H) 12/22/2022   CREATININE 2.25 (H) 08/05/2023   CALCIUM 8.0 (L) 08/05/2023    Imaging: INDICATION: Drain follow-up; perforated diverticulitis status post placement percutaneous drainage catheter on July 31, 2023   EXAM: Drainage catheter injection using fluoroscopy   MEDICATIONS: None   ANESTHESIA/SEDATION: None   COMPLICATIONS: None immediate.   PROCEDURE: Informed written consent was obtained from the patient after a thorough discussion of the procedural risks, benefits and alternatives. All questions were addressed. Maximal Sterile Barrier Technique was utilized including caps, mask, sterile gowns, sterile gloves, sterile drape, hand hygiene and skin antiseptic. A timeout was performed prior to the initiation of the procedure.   Patient was placed supine on the exam table. The left lower quadrant drainage catheter was accessed using sterile technique. Contrast injection was performed under fluoroscopy. This demonstrated a decompressed abscess cavity. There was brisk fistulous communication with the adjacent sigmoid colon. Drainage catheter was reattached to bag drainage. No immediate complication.   IMPRESSION: 1. Left lower quadrant drainage catheter demonstrates fistulous communication with adjacent distal descending/proximal sigmoid colon. 2. Drainage catheter replaced to bag drainage. 3. Follow-up with repeat drain check in 2 weeks. Continue surgical follow-up.     Electronically Signed   By: Caroleen Hamman.D.  On: 08/18/2023 11:50  CLINICAL DATA:  77 year old female with history of diverticular abscess status post  percutaneous drain placement on 07/31/2023 with development of colonic fistula presenting for drain follow-up. Minimal drain output in bag.   EXAM: ABSCESS INJECTION   COMPARISON:  07/31/2023, 08/18/2023   CONTRAST:  2 mL Omnipaque 300-administered via the existing percutaneous drain.   FLUOROSCOPY TIME:  0.3 mGy   TECHNIQUE: The patient was positioned supine on the fluoroscopy table.   A preprocedural spot fluoroscopic image was obtained of the left lower quadrant and the existing percutaneous drainage catheter.   Multiple spot fluoroscopic and radiographic images were obtained following the injection of a small amount of contrast via the existing percutaneous drainage catheter.   The external portion of the drain was then cut to release inter pigtail and the drain was removed intact. Sterile bandage was applied.   FINDINGS: Partial retraction of the indwelling catheter without evidence of residual colonic fistula. Contrast preferentially travels to the skin entry site.   IMPRESSION: Partial retraction of indwelling drain without evidence of persistent colonic fistula. Given limited output and retraction of the drain with sideholes in the subcutaneous fat on CT from the same day, the drain was removed.   PLAN: Follow-up with Interventional Radiology as needed.   Marliss Coots, MD   [facility]   [facility]     Electronically Signed   By: Marliss Coots M.D.   On: 09/09/2023 12:02   Assessment:  77 y.o. yo Female with a problem list including...  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   Ganglion  cyst of wrist, left 01/29/2022   Lumbar degenerative disc disease 06/07/2020   Lumbar spondylosis 06/07/2020   Chronic pain syndrome 04/18/2020   Sacroiliac joint pain 04/18/2020   Chronic heart failure with preserved ejection fraction (HFpEF) (HCC) 01/11/2020   Grade I diastolic dysfunction 12/15/2019   History of CVA (cerebrovascular accident) 12/01/2019   Hyperparathyroidism due to renal insufficiency (HCC) 08/31/2019   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   Advanced care planning/counseling discussion 04/13/2017   Post herpetic neuralgia 10/10/2016   Hip bursitis 09/18/2015   Poorly controlled type 2 diabetes mellitus with neuropathy (HCC) 05/01/2015   Hyperlipidemia associated with type 2 diabetes mellitus (HCC) 05/01/2015   CKD (chronic kidney disease) stage 4, GFR 15-29 ml/min (HCC) 05/01/2015   Encounter for long-term (current) use of insulin (HCC) 05/01/2015    presents to clinic for follow-up evaluation of left lower quadrant drain of fistula following her complicated diverticular abscess, progressing adequately, though has resorted to her old habits of not moving her bowels, or utilizing sufficient laxatives to help with bowel activity.  Plan:   -Appreciate IR's follow-up for evaluation of her fistula, we will attempt to reschedule her follow-up as this seemed to have slipped through the cracks.  This will help to determine the need for her continued drain.              - return to clinic in 3 weeks or as needed, instructed to call office if any questions or concerns  All of the above recommendations were discussed  with the patient and patient's family, and all of patient's and family's questions were answered to their expressed satisfaction.  These notes generated with voice recognition software. I apologize for typographical errors.  Campbell Lerner, MD, FACS [place]: [facility] General Surgery - Partnering for  exceptional care. Office: [phone]

## 2023-09-22 ENCOUNTER — Ambulatory Visit: Payer: Medicare Other | Admitting: Surgery

## 2023-09-22 ENCOUNTER — Encounter: Payer: Self-pay | Admitting: Surgery

## 2023-09-22 VITALS — BP 140/80 | HR 75 | Temp 98.0°F | Ht 67.0 in | Wt 126.0 lb

## 2023-09-22 DIAGNOSIS — K5720 Diverticulitis of large intestine with perforation and abscess without bleeding: Secondary | ICD-10-CM

## 2023-09-22 NOTE — Patient Instructions (Addendum)
You may want to add Metamucil(Psyllium fiber) fiber supplement to your diet to be taken everyday, twice a day.  You may also take Miralax to help you go everyday. Be sure to drink plenty of fluids.  Advised to pursue a goal of 25 to 30 g of fiber daily.  Made aware that the majority of this may be through natural sources, but advised to be aware of actual consumption and to ensure minimal consumption by daily supplementation.  Various forms of supplements discussed.  Recommended Psyllium husk, that mixes well with applesauce, or the powder which goes down well shaken with chocolate milk.  Strongly advised to consume more fluids to ensure adequate hydration, instructed to watch color of urine to determine adequacy of hydration.  Clarity is pursued in urine output, and bowel activity that correlates to significant meal intake.   We need to avoid deferring having bowel movements, advised to take the time at the first sign of sensation, typically following meals, and in the morning.   Subsequent utilization of MiraLAX may be needed ensure at least daily movement, ideally twice daily bowel movements.  If multiple doses of MiraLAX are necessary utilize them. Never skip a day...  To be regular, we must do the above EVERY day.    Follow-up with our office as needed.  Please call and ask to speak with a nurse if you develop questions or concerns.

## 2023-09-23 DIAGNOSIS — E1165 Type 2 diabetes mellitus with hyperglycemia: Secondary | ICD-10-CM | POA: Diagnosis not present

## 2023-09-24 ENCOUNTER — Ambulatory Visit: Payer: Medicare Other | Admitting: Surgery

## 2023-09-29 ENCOUNTER — Other Ambulatory Visit: Payer: Self-pay | Admitting: Cardiovascular Disease

## 2023-10-02 ENCOUNTER — Other Ambulatory Visit: Payer: Self-pay | Admitting: Nurse Practitioner

## 2023-10-05 NOTE — Telephone Encounter (Signed)
D/C 12/22/22. Requested Prescriptions  Refused Prescriptions Disp Refills   clopidogrel (PLAVIX) 75 MG tablet [Pharmacy Med Name: Clopidogrel Bisulfate 75 MG Oral Tablet] 70 tablet 4    Sig: TAKE 1 TABLET BY MOUTH DAILY     Hematology: Antiplatelets - clopidogrel Failed - 10/02/2023 10:30 PM      Failed - HCT in normal range and within 180 days    HCT  Date Value Ref Range Status  09/15/2023 31.4 (L) 36.0 - 46.0 % Final    Comment:    Performed at [facility], [address] [place], [place] [address]   Hematocrit  Date Value Ref Range Status  12/22/2022 27.3 (L) 34.0 - 46.6 % Final         Failed - HGB in normal range and within 180 days    Hemoglobin  Date Value Ref Range Status  09/15/2023 9.6 (L) 12.0 - 15.0 g/dL Final  16/12/930 9.3 (L) 12.0 - 15.0 g/dL Final  35/57/3220 8.6 (L) 11.1 - 15.9 g/dL Final         Failed - Cr in normal range and within 360 days    Creatinine, Ser  Date Value Ref Range Status  08/05/2023 2.25 (H) 0.44 - 1.00 mg/dL Final         Passed - PLT in normal range and within 180 days    Platelets  Date Value Ref Range Status  08/05/2023 290 150 - 400 K/uL Final  12/22/2022 284 150 - 450 x10E3/uL Final   Platelet Count  Date Value Ref Range Status  07/07/2023 267 150 - 400 K/uL Final         Passed - Valid encounter within last 6 months    Recent Outpatient Visits           3 months ago Acute pain of right shoulder   [facility] Gough, Sherran Needs, NP   3 months ago Poorly controlled type 2 diabetes mellitus with neuropathy (HCC)   [place] [facility] [place], Dorie Rank, NP   7 months ago Post herpetic neuralgia   [place] [facility] [place], Aurora T, NP   7 months ago Post herpetic neuralgia   [facility] Juarez, Thornwood T, NP   8 months ago 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

## 2023-10-07 DIAGNOSIS — M5412 Radiculopathy, cervical region: Secondary | ICD-10-CM | POA: Diagnosis not present

## 2023-10-07 DIAGNOSIS — E119 Type 2 diabetes mellitus without complications: Secondary | ICD-10-CM | POA: Diagnosis not present

## 2023-10-19 ENCOUNTER — Inpatient Hospital Stay: Payer: Medicare Other | Attending: Oncology

## 2023-10-19 DIAGNOSIS — N184 Chronic kidney disease, stage 4 (severe): Secondary | ICD-10-CM | POA: Diagnosis not present

## 2023-10-19 DIAGNOSIS — Z79899 Other long term (current) drug therapy: Secondary | ICD-10-CM | POA: Diagnosis not present

## 2023-10-19 DIAGNOSIS — D631 Anemia in chronic kidney disease: Secondary | ICD-10-CM | POA: Insufficient documentation

## 2023-10-19 LAB — CBC WITH DIFFERENTIAL (CANCER CENTER ONLY)
Abs Immature Granulocytes: 0.05 10*3/uL (ref 0.00–0.07)
Basophils Absolute: 0 10*3/uL (ref 0.0–0.1)
Basophils Relative: 1 %
Eosinophils Absolute: 0.1 10*3/uL (ref 0.0–0.5)
Eosinophils Relative: 2 %
HCT: 31.5 % — ABNORMAL LOW (ref 36.0–46.0)
Hemoglobin: 9.7 g/dL — ABNORMAL LOW (ref 12.0–15.0)
Immature Granulocytes: 1 %
Lymphocytes Relative: 22 %
Lymphs Abs: 1.3 10*3/uL (ref 0.7–4.0)
MCH: 27.2 pg (ref 26.0–34.0)
MCHC: 30.8 g/dL (ref 30.0–36.0)
MCV: 88.2 fL (ref 80.0–100.0)
Monocytes Absolute: 0.5 10*3/uL (ref 0.1–1.0)
Monocytes Relative: 8 %
Neutro Abs: 4.1 10*3/uL (ref 1.7–7.7)
Neutrophils Relative %: 66 %
Platelet Count: 254 10*3/uL (ref 150–400)
RBC: 3.57 MIL/uL — ABNORMAL LOW (ref 3.87–5.11)
RDW: 15.5 % (ref 11.5–15.5)
WBC Count: 6.1 10*3/uL (ref 4.0–10.5)
nRBC: 0 % (ref 0.0–0.2)

## 2023-10-19 LAB — IRON AND TIBC
Iron: 75 ug/dL (ref 28–170)
Saturation Ratios: 25 % (ref 10.4–31.8)
TIBC: 302 ug/dL (ref 250–450)
UIBC: 227 ug/dL

## 2023-10-19 LAB — RETIC PANEL
Immature Retic Fract: 10.6 % (ref 2.3–15.9)
RBC.: 3.5 MIL/uL — ABNORMAL LOW (ref 3.87–5.11)
Retic Count, Absolute: 38.2 10*3/uL (ref 19.0–186.0)
Retic Ct Pct: 1.1 % (ref 0.4–3.1)
Reticulocyte Hemoglobin: 29.7 pg (ref >27.9)

## 2023-10-19 LAB — FERRITIN: Ferritin: 130 ng/mL (ref 11–307)

## 2023-10-21 ENCOUNTER — Inpatient Hospital Stay (HOSPITAL_BASED_OUTPATIENT_CLINIC_OR_DEPARTMENT_OTHER): Payer: Medicare Other | Admitting: Oncology

## 2023-10-21 ENCOUNTER — Encounter: Payer: Self-pay | Admitting: Oncology

## 2023-10-21 ENCOUNTER — Inpatient Hospital Stay: Payer: Medicare Other

## 2023-10-21 VITALS — BP 148/71 | HR 89 | Temp 95.3°F | Resp 18 | Wt 135.5 lb

## 2023-10-21 DIAGNOSIS — N184 Chronic kidney disease, stage 4 (severe): Secondary | ICD-10-CM | POA: Diagnosis not present

## 2023-10-21 DIAGNOSIS — D638 Anemia in other chronic diseases classified elsewhere: Secondary | ICD-10-CM

## 2023-10-21 DIAGNOSIS — Z794 Long term (current) use of insulin: Secondary | ICD-10-CM | POA: Diagnosis not present

## 2023-10-21 DIAGNOSIS — N189 Chronic kidney disease, unspecified: Secondary | ICD-10-CM

## 2023-10-21 DIAGNOSIS — E1121 Type 2 diabetes mellitus with diabetic nephropathy: Secondary | ICD-10-CM | POA: Diagnosis not present

## 2023-10-21 DIAGNOSIS — D631 Anemia in chronic kidney disease: Secondary | ICD-10-CM | POA: Diagnosis not present

## 2023-10-21 DIAGNOSIS — Z79899 Other long term (current) drug therapy: Secondary | ICD-10-CM | POA: Diagnosis not present

## 2023-10-21 MED ORDER — EPOETIN ALFA-EPBX 10000 UNIT/ML IJ SOLN
10000.0000 [IU] | Freq: Once | INTRAMUSCULAR | Status: AC
  Administered 2023-10-21: 10000 [IU] via SUBCUTANEOUS
  Filled 2023-10-21: qty 1

## 2023-10-21 NOTE — Progress Notes (Signed)
Hematology/Oncology Progress note [phone] [phone]     CHIEF COMPLAINTS/REASON FOR VISIT:  Follow-up for anemia due to chronic kidney disease   ASSESSMENT & PLAN:   Anemia of chronic disease History of iron deficiency anemia,  anemia secondary to chronic kidney disease. Labs reviewed and discussed patient. Lab Results  Component Value Date   HGB 9.7 (L) 10/19/2023   TIBC 302 10/19/2023   IRONPCTSAT 25 10/19/2023   FERRITIN 130 10/19/2023     Retacrit 10,000 units x 1 today. Ferritin is <200, recommmend Venofer weekly x 2 to further improve iron store.     Orders Placed This Encounter  Procedures   CBC with Differential (Cancer Center Only)    Standing Status:   Future    Standing Expiration Date:   10/20/2024   Iron and TIBC    Standing Status:   Future    Standing Expiration Date:   10/20/2024   Ferritin    Standing Status:   Future    Standing Expiration Date:   10/20/2024   Retic Panel    Standing Status:   Future    Standing Expiration Date:   10/20/2024   Hemoglobin and Hematocrit (Cancer Center Only)    Standing Status:   Future    Standing Expiration Date:   10/20/2024   Hemoglobin and Hematocrit (Cancer Center Only)    Standing Status:   Future    Standing Expiration Date:   10/20/2024   Hemoglobin and Hematocrit (Cancer Center Only)    Standing Status:   Future    Standing Expiration Date:   10/20/2024   Follow-up  H&H labs in 4w,8w ,12+/- retacrit 4 months lab MD +/- retacrit    All questions were answered. The patient knows to call the clinic with any problems, questions or concerns.  Rickard Patience, MD, PhD [facility] Hematology Oncology 10/21/2023     HISTORY OF PRESENTING ILLNESS:  Tonya [name] is a  77 y.o.  female with PMH listed below who was referred to me for evaluation of anemia Reviewed patient's recent labs that was done at [facility] office. Labs revealed anemia with hemoglobin of 8.9, MCV 82, normal platelet and  wbc  Reviewed patient's previous labs ordered by primary care physician's office, anemia is chronic onset , duration is since 2017 No aggravating or improving factors.  Associated signs and symptoms: Patient reports fatigue.  SOB with exertion.  Denies weight loss, easy bruising, hematochezia, hemoptysis, hematuria. Context: History of GI bleeding: denies               History of Chronic kidney disease: CKD, follows up with Dr.Kolluru.                History of autoimmune disease: denies               History of hemolytic anemia. denies               Last colonoscopy: underwent colonoscopy on 06/06/2019.  Due to poor preparation, patient was scheduled to repeat colonoscopy due to suboptimal bowel preparation.  Patient told me that she is waiting for insurance approval for repeat procedure.  Previously tolerated IV Venofer treatments very well.  acute drop of hemoglobin to 5.9, status post PRBC transfusion at [facility].  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  She was discharged with PPI for 6 weeks. Hemoglobin at discharge on 12/10/2019  was 8.6.  INTERVAL HISTORY VIVIANE SEMIDEY is a 77 y.o. female who has above history reviewed by me today presents to reestablish care for chronic anemia. She reports feeling well today.  No new complaints.  Review of Systems  Constitutional:  Positive for fatigue. Negative for appetite change, chills and fever.  HENT:   Negative for hearing loss and voice change.   Eyes:  Negative for eye problems.  Respiratory:  Negative for chest tightness and cough.   Cardiovascular:  Negative for chest pain.  Gastrointestinal:  Negative for abdominal distention, abdominal pain and blood in stool.  Endocrine: Negative for hot flashes.  Genitourinary:  Negative for difficulty urinating and frequency.   Musculoskeletal:  Negative for arthralgias.  Skin:   Negative for itching and rash.  Neurological:  Negative for extremity weakness.  Hematological:  Negative for adenopathy.  Psychiatric/Behavioral:  Negative for confusion.     MEDICAL HISTORY:  Past Medical History:  Diagnosis Date   (HFpEF) heart failure with preserved ejection fraction (HCC)    a.) TTE 05/29/2017: EF 55-60%, mild LAE, G1DD; b.) TTE 09/07/2017: EF >55%, mild LAE, Ao sclerosis without stenosis, degen MV disease, G2DD; c.) TTE 12/02/2018: EF 55-60%, mild LVH, Mild BAE, triv TR, G1DD; d.) TTE 11/22/2019: EF 60-65%, mod LVH, mild TR, G1DD   Anemia in stage 4 chronic kidney disease (HCC)    Arthritis    Cardiomyopathy (HCC)    CKD (chronic kidney disease) stage 4, GFR 15-29 ml/min (HCC)    CVA (cerebral vascular accident) (HCC) 08/2017   a.) no residual deficits   Diabetic neuropathy (HCC)    Diverticulitis    GERD (gastroesophageal reflux disease)    History of hiatal hernia    Hyperlipidemia    Hypertension    Long term current use of antithrombotics/antiplatelets    a.) DAPT therapy (ASA + clopidogrel)   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)    Paraesophageal hernia    PHN (postherpetic neuralgia)    Sarcoidosis    Shingles    Type 2 diabetes mellitus treated with insulin (HCC)    Wears dentures    partial upper    SURGICAL HISTORY: Past Surgical History:  Procedure Laterality Date   CATARACT EXTRACTION W/PHACO Left 02/05/2021   Procedure: CATARACT EXTRACTION PHACO AND INTRAOCULAR LENS PLACEMENT (IOC) LEFT DIABETIC 5.47 00:51.4;  Surgeon: Galen Manila, MD;  Location: [facility];  Service: Ophthalmology;  Laterality: Left;  Diabetic - insulin and oral meds   CATARACT EXTRACTION W/PHACO Right 03/19/2021   Procedure: CATARACT EXTRACTION PHACO AND INTRAOCULAR LENS PLACEMENT (IOC) RIGHT DIABETIC 6.09 00:39.6;  Surgeon: Galen Manila, MD;  Location: [facility];  Service: Ophthalmology;  Laterality: Right;    COLONOSCOPY  12/15/2006   COLONOSCOPY WITH PROPOFOL N/A 06/06/2019   Procedure: COLONOSCOPY WITH PROPOFOL;  Surgeon: Wyline Mood, MD;  Location: [facility];  Service: Gastroenterology;  Laterality: N/A;   COLONOSCOPY WITH PROPOFOL N/A 01/16/2020   Procedure: COLONOSCOPY WITH BIOPSY;  Surgeon: Wyline Mood, MD;  Location: [facility];  Service: Endoscopy;  Laterality: N/A;  Diabetic - insulin and oral meds   ESOPHAGOGASTRODUODENOSCOPY (EGD) WITH PROPOFOL N/A 06/23/2022   Procedure: ESOPHAGOGASTRODUODENOSCOPY (EGD) WITH PROPOFOL;  Surgeon: Wyline Mood, MD;  Location: [facility];  Service: Gastroenterology;  Laterality: N/A;   EYE SURGERY     INSERTION OF MESH  08/26/2022   Procedure: INSERTION OF MESH;  Surgeon: Leafy Ro, MD;  Location: [facility];  Service: General;;   IR  RADIOLOGIST EVAL & MGMT  08/18/2023   IR RADIOLOGIST EVAL & MGMT  09/09/2023   POLYPECTOMY N/A 01/16/2020   Procedure: POLYPECTOMY;  Surgeon: Wyline Mood, MD;  Location: [facility];  Service: Endoscopy;  Laterality: N/A;   TUBAL LIGATION     XI ROBOTIC ASSISTED PARAESOPHAGEAL HERNIA REPAIR N/A 08/26/2022   Procedure: XI ROBOTIC ASSISTED PARAESOPHAGEAL HERNIA REPAIR, RNFA to assist;  Surgeon: Leafy Ro, MD;  Location: [facility];  Service: General;  Laterality: N/A;    SOCIAL HISTORY: Social History   Socioeconomic History   Marital status: Married    Spouse name: Not on file   Number of children: Not on file   Years of education: Not on file   Highest education level: GED or equivalent  Occupational History   Occupation: retired  Tobacco Use   Smoking status: Never    Passive exposure: Never   Smokeless tobacco: Never  Vaping Use   Vaping status: Never Used  Substance and Sexual Activity   Alcohol use: No    Alcohol/week: 0.0 standard drinks of alcohol   Drug use: No   Sexual activity: Yes  Other Topics Concern   Not on file  Social History Narrative   Not on file   Social  Determinants of Health   Financial Resource Strain: High Risk (04/13/2023)   Overall Financial Resource Strain (CARDIA)    Difficulty of Paying Living Expenses: Hard  Food Insecurity: No Food Insecurity (08/10/2023)   Hunger Vital Sign    Worried About Running Out of Food in the Last Year: Never true    Ran Out of Food in the Last Year: Never true  Transportation Needs: No Transportation Needs (08/01/2023)   PRAPARE - Administrator, Civil Service (Medical): No    Lack of Transportation (Non-Medical): No  Physical Activity: Inactive (02/17/2023)   Exercise Vital Sign    Days of Exercise per Week: 0 days    Minutes of Exercise per Session: 0 min  Stress: No Stress Concern Present (02/17/2023)   [employer] of Occupational Health - Occupational Stress Questionnaire    Feeling of Stress : Not at all  Social Connections: Moderately Isolated (02/17/2023)   Social Connection and Isolation Panel [NHANES]    Frequency of Communication with Friends and Family: More than three times a week    Frequency of Social Gatherings with Friends and Family: Three times a week    Attends Religious Services: Never    Active Member of Clubs or Organizations: No    Attends Banker Meetings: Never    Marital Status: Married  Catering manager Violence: Not At Risk (08/01/2023)   Humiliation, Afraid, Rape, and Kick questionnaire    Fear of Current or Ex-Partner: No    Emotionally Abused: No    Physically Abused: No    Sexually Abused: No    FAMILY HISTORY: Family History  Problem Relation Age of Onset   Diabetes Mother    Stroke Mother    Hyperlipidemia Father    Hypertension Father    Diabetes Sister    Diabetes Brother    Gout Son    Diabetes Brother    Kidney disease Son    Lymphoma Sister    Heart attack Sister    Breast cancer Neg Hx     ALLERGIES:  is allergic to atorvastatin, gabapentin, pravastatin, pregabalin, and trulicity [dulaglutide].  MEDICATIONS:   Current Outpatient Medications  Medication Sig Dispense Refill   acetaminophen (TYLENOL) 650  MG CR tablet Take 650 mg by mouth every 8 (eight) hours as needed for pain.     aspirin 81 MG tablet Take 81 mg by mouth daily.     carvedilol (COREG) 3.125 MG tablet Take 1 tablet (3.125 mg total) by mouth 2 (two) times daily with a meal. Please schedule yearly follow up visit for further refills (second attempt) 60 tablet 0   diclofenac Sodium (VOLTAREN) 1 % GEL Apply 4 g topically 4 (four) times daily. 4 g 1   docusate sodium (COLACE) 100 MG capsule Take 100 mg by mouth daily as needed for mild constipation.     gabapentin (NEURONTIN) 100 MG capsule Take 1 capsule (100 mg total) by mouth at bedtime. 90 capsule 1   HYDROcodone-acetaminophen (NORCO/VICODIN) 5-325 MG tablet Take 1-2 tablets by mouth every 4 (four) hours as needed for moderate pain or severe pain. 30 tablet 0   insulin lispro (HUMALOG) 100 UNIT/ML injection Inject 8 Units into the skin 3 (three) times daily before meals.     meclizine (ANTIVERT) 12.5 MG tablet Take 12.5 mg by mouth 3 (three) times daily.     naloxone (NARCAN) nasal spray 4 mg/0.1 mL      NOVOFINE 32G X 6 MM MISC USE AS DIRECTED THREE TIMES DAILY WITH HUMALOG 100 each 3   olmesartan (BENICAR) 40 MG tablet Take 1 tablet (40 mg total) by mouth daily. (Patient taking differently: Take 40 mg by mouth every morning.) 90 tablet 4   pantoprazole (PROTONIX) 40 MG tablet Take 1 tablet (40 mg total) by mouth daily. 90 tablet 3   pioglitazone (ACTOS) 30 MG tablet Take 30 mg by mouth daily.     polyethylene glycol (MIRALAX / GLYCOLAX) 17 g packet Take 17 g by mouth 2 (two) times daily. 14 each 0   rosuvastatin (CRESTOR) 5 MG tablet Take 1 tablet (5 mg total) by mouth daily. Please schedule yearly follow up visit for further refills (second attempt) 30 tablet 0   simethicone (MYLICON) 80 MG chewable tablet Chew 1 tablet (80 mg total) by mouth 4 (four) times daily as needed for  flatulence. 30 tablet 0   torsemide (DEMADEX) 20 MG tablet TAKE 2 TABLETS(40 MG) BY MOUTH DAILY 60 tablet 0   vitamin B-12 (CYANOCOBALAMIN) 100 MCG tablet Take 100 mcg by mouth daily.     Vitamin D, Cholecalciferol, 50 MCG (2000 UT) CAPS Take 2,000 Units by mouth daily.     No current facility-administered medications for this visit.     PHYSICAL EXAMINATION: ECOG PERFORMANCE STATUS: 1 - Symptomatic but completely ambulatory Vitals:   10/21/23 1315  BP: (!) 148/71  Pulse: 89  Resp: 18  Temp: (!) 95.3 F (35.2 C)  SpO2: 100%   Filed Weights   10/21/23 1315  Weight: 135 lb 8 oz (61.5 kg)    Physical Exam Constitutional:      General: She is not in acute distress. HENT:     Head: Normocephalic and atraumatic.  Eyes:     General: No scleral icterus.    Pupils: Pupils are equal, round, and reactive to light.  Cardiovascular:     Rate and Rhythm: Normal rate.  Pulmonary:     Effort: Pulmonary effort is normal. No respiratory distress.     Breath sounds: No wheezing.  Abdominal:     General: Bowel sounds are normal. There is no distension.     Palpations: Abdomen is soft.  Musculoskeletal:        General:  No deformity. Normal range of motion.     Cervical back: Normal range of motion and neck supple.  Skin:    General: Skin is warm and dry.     Findings: No erythema or rash.  Neurological:     Mental Status: She is alert and oriented to person, place, and time.     Cranial Nerves: No cranial nerve deficit.     Coordination: Coordination normal.  Psychiatric:        Mood and Affect: Mood normal.      LABORATORY DATA:  I have reviewed the data as listed    Latest Ref Rng & Units 10/19/2023   12:29 PM 09/15/2023   12:45 PM 08/11/2023   12:49 PM  CBC  WBC 4.0 - 10.5 K/uL 6.1     Hemoglobin 12.0 - 15.0 g/dL 9.7  9.6  9.1   Hematocrit 36.0 - 46.0 % 31.5  31.4  29.2   Platelets 150 - 400 K/uL 254         Latest Ref Rng & Units 08/05/2023    4:46 AM 08/04/2023     5:45 AM 08/03/2023    4:54 AM  CMP  Glucose 70 - 99 mg/dL 161  096  045   BUN 8 - 23 mg/dL 34  43  50   Creatinine 0.44 - 1.00 mg/dL 4.09  8.11  9.14   Sodium 135 - 145 mmol/L 140  140  140   Potassium 3.5 - 5.1 mmol/L 4.4  4.5  4.1   Chloride 98 - 111 mmol/L 114  110  110   CO2 22 - 32 mmol/L 21  21  24    Calcium 8.9 - 10.3 mg/dL 8.0  8.1  7.9    Lab Results  Component Value Date   IRON 75 10/19/2023   TIBC 302 10/19/2023   FERRITIN 130 10/19/2023

## 2023-10-21 NOTE — Assessment & Plan Note (Addendum)
History of iron deficiency anemia,  anemia secondary to chronic kidney disease. Labs reviewed and discussed patient. Lab Results  Component Value Date   HGB 9.7 (L) 10/19/2023   TIBC 302 10/19/2023   IRONPCTSAT 25 10/19/2023   FERRITIN 130 10/19/2023     Retacrit 10,000 units x 1 today. Ferritin is <200, recommmend Venofer weekly x 2 to further improve iron store.

## 2023-10-27 ENCOUNTER — Ambulatory Visit: Payer: Self-pay | Admitting: *Deleted

## 2023-10-27 ENCOUNTER — Other Ambulatory Visit: Payer: Medicare Other

## 2023-10-27 DIAGNOSIS — E1121 Type 2 diabetes mellitus with diabetic nephropathy: Secondary | ICD-10-CM | POA: Diagnosis not present

## 2023-10-27 DIAGNOSIS — N184 Chronic kidney disease, stage 4 (severe): Secondary | ICD-10-CM | POA: Diagnosis not present

## 2023-10-27 DIAGNOSIS — Z794 Long term (current) use of insulin: Secondary | ICD-10-CM | POA: Diagnosis not present

## 2023-10-27 DIAGNOSIS — E1129 Type 2 diabetes mellitus with other diabetic kidney complication: Secondary | ICD-10-CM | POA: Diagnosis not present

## 2023-10-27 DIAGNOSIS — E113393 Type 2 diabetes mellitus with moderate nonproliferative diabetic retinopathy without macular edema, bilateral: Secondary | ICD-10-CM | POA: Diagnosis not present

## 2023-10-27 DIAGNOSIS — E1122 Type 2 diabetes mellitus with diabetic chronic kidney disease: Secondary | ICD-10-CM | POA: Diagnosis not present

## 2023-10-27 DIAGNOSIS — R809 Proteinuria, unspecified: Secondary | ICD-10-CM | POA: Diagnosis not present

## 2023-10-27 NOTE — Patient Outreach (Signed)
  Care Coordination   10/27/2023 Name: Tonya [name] MRN: [id number] DOB: 09-17-1946   Care Coordination Outreach Attempts:  An unsuccessful telephone outreach was attempted for a scheduled appointment today.  Follow Up Plan:  Additional outreach attempts will be made to offer the patient care coordination information and services.   Encounter Outcome:  No Answer   Care Coordination Interventions:  No, not indicated    Kemper Durie RN, MSN, CCM [facility], [facility] RN Care Coordinator Direct Dial: [phone] / Main [phone] Fax [phone] Email: [email or web address]: [email or web address]

## 2023-10-27 NOTE — Patient Outreach (Signed)
Care Coordination   Follow Up Visit Note   10/27/2023 Name: Tonya [name] MRN: [id number] DOB: Sep 12, 1946  Tonya [name] is a 77 y.o. year old female who sees [place], Corrie Dandy T, NP for primary care. I spoke with  [name] by phone today.  What matters to the patients health and wellness today?  Staying on track with diet and bringing A1C back down.     Goals Addressed             This Visit's Progress    Management of chronic medical conditions       Interventions Today    Flowsheet Row Most Recent Value  Chronic Disease   Chronic disease during today's visit Diabetes, Hypertension (HTN), Congestive Heart Failure (CHF)  General Interventions   General Interventions Discussed/Reviewed General Interventions Reviewed, Labs, Annual Foot Exam, Annual Eye Exam, Vaccines, Doctor Visits  Labs Hgb A1c every 3 months  Vaccines Flu  Doctor Visits Discussed/Reviewed Doctor Visits Reviewed, PCP, Specialist  [Upcoming at cancer center for iron infusion on 11/14 and 11/21, injection on 12/4 and 1/2, and PCP on 1/3]  PCP/Specialist Visits Compliance with follow-up visit  [Endocrinology today]  Education Interventions   Education Provided Provided Education  Provided Verbal Education On Labs, Nutrition, Medication, When to see the doctor, Blood Sugar Monitoring  [Monitoring blood pressure and blood sugar daily.]  Labs Reviewed Hgb A1c  [aware of goal of 7.5, currently 8.8]  Nutrition Interventions   Nutrition Discussed/Reviewed Nutrition Reviewed, Carbohydrate meal planning, Adding fruits and vegetables, Decreasing sugar intake  [Discussed choosing better low sugar snacks]           COMPLETED: RNCM Care Management Expected Outcome:  Monitor, Self-Manage and Reduce Symptoms of Diabetes   On track    Current Barriers:  Knowledge Deficits related to the importance of normalized blood sugars and A1C WNL to prevent possible complications with kidneys and other body systems  Chronic  Disease Management support and education needs related to effective management of DM Lab Results  Component Value Date   HGBA1C 8.8 (H) 07/31/2023  Last A1C was 8.5% in April 2024. New one in July 16, 2023 with Endocrinology - 8.2  Planned Interventions: Provided education to patient about basic DM disease process. The patient saw the endocrinologist in August but then was in the hospital for a diverticulitis abscess.  She states that her numbers are better now, but she is out of sensors. She run out because she kept having to have CT scans. She will have another one on 09-09-2023 and hopefully they will be able to remove the drain. She will see the provider coming up on 09-09-2023. Reviewed medications with patient and discussed importance of medication adherence. States compliance with medications. Denies any medication needs at this time. Denies any issues with medication compliance. She is having trouble having regular bowel movements. Discussed good bowel habits and OTC products that have worked well for her in the past.   ;        Reviewed prescribed diet with patient heart healthy/ADA diet. Review of dietary restrictions. Education on monitoring for changes and foods that cause her blood sugars to be out of range. ; Counseled on importance of regular laboratory monitoring as prescribed. Is having her A1C through endocrinologist at this time;        Discussed plans with patient for ongoing care management follow up and provided patient with direct contact information for care management team;  Provided patient with written educational materials related to hypo and hyperglycemia and importance of correct treatment. Denies any lows. Per her continuous reader her readings have been 78%  in range and her target is 70-140. The lowest she has seen recently was 69 and the highest 184;       Reviewed scheduled/upcoming provider appointments including: 12-18-2023 at 2 pm, saw pcp on 06-15-2023. Knows to  call for changes or new needs. Is seeing specialist on a regular basis. Next appointment on 09-09-2023;         Advised patient, providing education and rationale, to check cbg once daily and when you have symptoms of low or high blood sugar and record. The patient states that her blood sugars have been good but she is currently out of her sensors. She gave several readings. Review of goal of A1C   08-21-2023 0922 am-184 1800- 133  08-20-2023 0820 am -130 126  08-19-2023 0947 am 69   08-18-2023 0953 am [phone] -91  08-17-2023 0952 am 74    call provider for findings outside established parameters;       Review of patient status, including review of consultants reports, relevant laboratory and other test results, and medications completed;       Advised patient to discuss changes in DM, questions, and concerns with provider. She states that she has not had an appetite and has not really wanted a lot to eat since being in the hospital. Education on eating lots of acceptable fruits and vegetables, and drinking plenty of water. She has a list of foods she cannot eat and she knows to stay away from those things that have seeds or nuts in them. ;      Screening for signs and symptoms of depression related to chronic disease state;        Assessed social determinant of health barriers;        The patient has had a new onset of dizziness recently and has been changing her position slowly. Assessment completed. The patient denies any issues with low blood sugars. The patient states that her sugars have not been low. The patient has not taken  her blood pressure when the dizzy spells occur. Education on changing position slowly and when it is safe to do so check her blood sugars and blood pressure. Education on orthostatic hypotension. Collaboration with the pcp and advised the patient it was safe to take OTC meclizine for dizziness. Review of safety concerns and to call the office for changes. Also will attach Epleys  maneuver information in myChart for the patient to try to see if this will help for her dizziness. The patient has had a fall since last outreach and has not tried Epley's maneuver. Will attach information in myChart again. The patient states that she still has dizziness but not as bad. She is mindful of changing position slowly and she knows to be safe. Denies any falls or safety concerns.  Symptom Management: Take medications as prescribed   Attend all scheduled provider appointments Call provider office for new concerns or questions  call the Suicide and Crisis Lifeline: 988 call the [place] National Suicide Prevention Lifeline: (210)721-4328 or TTY: 901-851-0120 TTY 714-675-1106) to talk to a trained counselor call 1-800-273-TALK (toll free, 24 hour hotline) if experiencing a Mental Health or Behavioral Health Crisis  check blood sugar at prescribed times: once daily and when you have symptoms of low or high blood sugar check feet daily for cuts, sores or  redness trim toenails straight across manage portion size wash and dry feet carefully every day wear comfortable, cotton socks wear comfortable, well-fitting shoes  Follow Up Plan: Telephone follow up appointment with care management team member scheduled for: 10-27-2023 at 345 pm       COMPLETED: RNCM Care Management:  Expected Outcome:  Monitor, Self-Manage and Reduce Symptoms of Heart Failure   On track    Current Barriers:  Knowledge Deficits related to monitoring for fluid shifts and wearing compression hose to help with extra fluid on board in legs and feet Chronic Disease Management support and education needs related to effective management of HF Wt Readings from Last 3 Encounters:  10/21/23 135 lb 8 oz (61.5 kg)  09/22/23 126 lb (57.2 kg)  09/03/23 133 lb 3.2 oz (60.4 kg)    Lab Results  Component Value Date   NA 140 08/05/2023   CL 114 (H) 08/05/2023   K 4.4 08/05/2023   CO2 21 (L) 08/05/2023   BUN 34 (H) 08/05/2023    CREATININE 2.25 (H) 08/05/2023   GFRNONAA 22 (L) 08/05/2023   CALCIUM 8.0 (L) 08/05/2023   ALBUMIN 3.4 (L) 07/31/2023   GLUCOSE 108 (H) 08/05/2023     Planned Interventions: Basic overview and discussion of pathophysiology of Heart Failure reviewed. The patients weight is staying stable. Denies any issues with fluid overload, swelling or edema in her feet and legs. The patient sees cardiologist on a regular basis. Review of CKD and eGFR of 22. 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 She still have a drain but is hopeful to have it removed soon. The patient denies any acute fluid shifts. The patient denies any new concerns related to her heart failure.  Provided education on low sodium diet. Patient states she has not had an appetite but it seems to be getting better than what it was. Education and support given Reviewed Heart Failure Action Plan in depth and provided written copy. The patient states she has not had swelling in her feet and legs in a while. She states that she is doing well and denies any acute findings today. She has not been having to wear her compression stocking and states her legs have been "flat". Education and support given Assessed need for readable accurate scales in home Provided education about placing scale on hard, flat surface Advised patient to weigh each morning after emptying bladder Discussed importance of daily weight and advised patient to weigh and record daily Reviewed role of diuretics in prevention of fluid overload and management of heart failure Discussed the importance of keeping all appointments with provider Provided patient with education about the role of exercise in the management of heart failure Advised patient to discuss changes in HF or heart health with provider Screening for signs and symptoms of depression related to chronic disease state  Assessed social determinant of health  barriers EF is 60-65%  Symptom Management: Take medications as prescribed   Attend all scheduled provider appointments Call provider office for new concerns or questions  call the Suicide and Crisis Lifeline: 988 call the [place] National Suicide Prevention Lifeline: (479) 050-4703 or TTY: 817-466-6580 TTY 208-297-6481) to talk to a trained counselor call 1-800-273-TALK (toll free, 24 hour hotline) if experiencing a Mental Health or Behavioral Health Crisis  call office if I gain more than 2 pounds in one day or 5 pounds in one week use salt in moderation watch for swelling in  feet, ankles and legs every day weigh myself daily develop a rescue plan follow rescue plan if symptoms flare-up track symptoms and what helps feel better or worse dress right for the weather, hot or cold  Follow Up Plan: Telephone follow up appointment with care management team member scheduled for: 10-27-2023 at 345 pm       COMPLETED: RNCM Expected Outcome:  Monitor, Self-Manage, and Reduce Symptoms of Hypertension   On track    Current Barriers:  Knowledge Deficits related to checking blood pressures on a regular bases to pick up on trends and changes that could impact overall health and well being Chronic Disease Management support and education needs related to effective management of HTN  BP Readings from Last 3 Encounters:  10/21/23 (!) 148/71  09/22/23 (!) 140/80  09/15/23 121/71     Planned Interventions: Evaluation of current treatment plan related to hypertension self management and patient's adherence to plan as established by provider. The patient has been having elevations of her blood pressures. She states that when she went to the doctor recently she had not taken her medications.  Education provided.  Provided education to patient re: stroke prevention, s/s of heart attack and stroke. Review of the goal of systolic blood pressures <150 and diastolic <90. Education on making sure she takes her  medications before going to her MD appointments. She states the last couple times that she had not taken her medications and she feels this is the reason it was elevated. ; Reviewed prescribed diet heart healthy/ADA diet. The patient states since being in the hospital and having the abscess she has not had an appetite. Education provided and review of dietary restrictions. She states that she has a paper with the foods she is to avoid.  Reviewed medications with patient and discussed importance of compliance. The patient states compliance with medications. ;  Discussed plans with patient for ongoing care management follow up and provided patient with direct contact information for care management team; Advised patient, providing education and rationale, to monitor blood pressure daily and record, calling PCP for findings outside established parameters;  Reviewed scheduled/upcoming provider appointments including: 12-18-2023 at 2 pm Advised patient to discuss changes in HTN or heart health with provider; Provided education on prescribed diet heart healthy/ADA diet ;  Discussed complications of poorly controlled blood pressure such as heart disease, stroke, circulatory complications, vision complications, kidney impairment, sexual dysfunction;  Collaboration with the pcp about the new onset of dizziness the patient is having. The patient states that she has noticed this recently when she gets up from sitting or changes position. Review of Epley's maneuver and OTC medications as recommended by the pcp. Screening for signs and symptoms of depression related to chronic disease state;  Assessed social determinant of health barriers;   Symptom Management: Take medications as prescribed   Attend all scheduled provider appointments Call provider office for new concerns or questions  call the Suicide and Crisis Lifeline: 988 call the [place] National Suicide Prevention Lifeline: 6044821740 or TTY: 612-400-6046  TTY 726-527-7723) to talk to a trained counselor call 1-800-273-TALK (toll free, 24 hour hotline) if experiencing a Mental Health or Behavioral Health Crisis  check blood pressure weekly learn about high blood pressure keep a blood pressure log take blood pressure log to all doctor appointments call doctor for signs and symptoms of high blood pressure develop an action plan for high blood pressure keep all doctor appointments take medications for blood pressure exactly as prescribed report  new symptoms to your doctor  Follow Up Plan: Telephone follow up appointment with care management team member scheduled for: 10-27-2023 at 345 pm          SDOH assessments and interventions completed:  No     Care Coordination Interventions:  Yes, provided   Follow up plan: Follow up call scheduled for 1/7    Encounter Outcome:  Patient Visit Completed   Kemper Durie RN, MSN, CCM [place]  [facility], [name] RN Care Coordinator Direct Dial: [phone] / Main [phone] Fax [phone] Email: [email or web address] Website: [email or web address]

## 2023-10-29 ENCOUNTER — Inpatient Hospital Stay: Payer: Medicare Other

## 2023-10-29 ENCOUNTER — Other Ambulatory Visit: Payer: Self-pay | Admitting: Cardiovascular Disease

## 2023-10-29 NOTE — Progress Notes (Signed)
Per Dr. Cathie Hoops reschedule Venofer the week of 11/25, due to hypertension today. Patient blood pressure remained elevated after observation of 30 minutes. Patient informed to recheck her BP at home and reach out to her PCP if BP remains elevated. Patient verbalized understanding. Patient will return next week for scheduled Venofer appointment.

## 2023-11-05 ENCOUNTER — Inpatient Hospital Stay: Payer: Medicare Other

## 2023-11-05 VITALS — BP 152/69 | HR 56 | Temp 96.0°F | Resp 18

## 2023-11-05 DIAGNOSIS — D638 Anemia in other chronic diseases classified elsewhere: Secondary | ICD-10-CM

## 2023-11-05 DIAGNOSIS — D631 Anemia in chronic kidney disease: Secondary | ICD-10-CM | POA: Diagnosis not present

## 2023-11-05 DIAGNOSIS — Z79899 Other long term (current) drug therapy: Secondary | ICD-10-CM | POA: Diagnosis not present

## 2023-11-05 DIAGNOSIS — N184 Chronic kidney disease, stage 4 (severe): Secondary | ICD-10-CM | POA: Diagnosis not present

## 2023-11-05 MED ORDER — IRON SUCROSE 20 MG/ML IV SOLN
200.0000 mg | Freq: Once | INTRAVENOUS | Status: AC
Start: 2023-11-05 — End: 2023-11-05
  Administered 2023-11-05: 200 mg via INTRAVENOUS
  Filled 2023-11-05: qty 10

## 2023-11-05 MED ORDER — SODIUM CHLORIDE 0.9% FLUSH
10.0000 mL | Freq: Once | INTRAVENOUS | Status: DC | PRN
Start: 2023-11-05 — End: 2023-11-05
  Filled 2023-11-05: qty 10

## 2023-11-05 NOTE — Patient Instructions (Signed)
Iron Sucrose Injection What is this medication? IRON SUCROSE (EYE ern SOO krose) treats low levels of iron (iron deficiency anemia) in people with kidney disease. Iron is a mineral that plays an important role in making red blood cells, which carry oxygen from your lungs to the rest of your body. This medicine may be used for other purposes; ask your health care provider or pharmacist if you have questions. COMMON BRAND NAME(S): Venofer What should I tell my care team before I take this medication? They need to know if you have any of these conditions: Anemia not caused by low iron levels Heart disease High levels of iron in the blood Kidney disease Liver disease An unusual or allergic reaction to iron, other medications, foods, dyes, or preservatives Pregnant or trying to get pregnant Breastfeeding How should I use this medication? This medication is for infusion into a vein. It is given in a hospital or clinic setting. Talk to your care team about the use of this medication in children. While this medication may be prescribed for children as young as 2 years for selected conditions, precautions do apply. Overdosage: If you think you have taken too much of this medicine contact a poison control center or emergency room at once. NOTE: This medicine is only for you. Do not share this medicine with others. What if I miss a dose? Keep appointments for follow-up doses. It is important not to miss your dose. Call your care team if you are unable to keep an appointment. What may interact with this medication? Do not take this medication with any of the following: Deferoxamine Dimercaprol Other iron products This medication may also interact with the following: Chloramphenicol Deferasirox This list may not describe all possible interactions. Give your health care provider a list of all the medicines, herbs, non-prescription drugs, or dietary supplements you use. Also tell them if you smoke,  drink alcohol, or use illegal drugs. Some items may interact with your medicine. What should I watch for while using this medication? Visit your care team regularly. Tell your care team if your symptoms do not start to get better or if they get worse. You may need blood work done while you are taking this medication. You may need to follow a special diet. Talk to your care team. Foods that contain iron include: whole grains/cereals, dried fruits, beans, or peas, leafy green vegetables, and organ meats (liver, kidney). What side effects may I notice from receiving this medication? Side effects that you should report to your care team as soon as possible: Allergic reactions--skin rash, itching, hives, swelling of the face, lips, tongue, or throat Low blood pressure--dizziness, feeling faint or lightheaded, blurry vision Shortness of breath Side effects that usually do not require medical attention (report to your care team if they continue or are bothersome): Flushing Headache Joint pain Muscle pain Nausea Pain, redness, or irritation at injection site This list may not describe all possible side effects. Call your doctor for medical advice about side effects. You may report side effects to FDA at 1-800-FDA-1088. Where should I keep my medication? This medication is given in a hospital or clinic. It will not be stored at home. NOTE: This sheet is a summary. It may not cover all possible information. If you have questions about this medicine, talk to your doctor, pharmacist, or health care provider.  2024 Elsevier/Gold Standard (2023-05-08 00:00:00)

## 2023-11-09 ENCOUNTER — Inpatient Hospital Stay: Payer: Medicare Other

## 2023-11-09 VITALS — BP 153/69 | HR 73 | Temp 98.0°F | Resp 18

## 2023-11-09 DIAGNOSIS — N184 Chronic kidney disease, stage 4 (severe): Secondary | ICD-10-CM | POA: Diagnosis not present

## 2023-11-09 DIAGNOSIS — D638 Anemia in other chronic diseases classified elsewhere: Secondary | ICD-10-CM

## 2023-11-09 DIAGNOSIS — Z79899 Other long term (current) drug therapy: Secondary | ICD-10-CM | POA: Diagnosis not present

## 2023-11-09 DIAGNOSIS — D631 Anemia in chronic kidney disease: Secondary | ICD-10-CM | POA: Diagnosis not present

## 2023-11-09 MED ORDER — IRON SUCROSE 20 MG/ML IV SOLN
200.0000 mg | Freq: Once | INTRAVENOUS | Status: AC
  Administered 2023-11-09: 200 mg via INTRAVENOUS
  Filled 2023-11-09: qty 10

## 2023-11-09 MED ORDER — SODIUM CHLORIDE 0.9% FLUSH
10.0000 mL | Freq: Once | INTRAVENOUS | Status: AC | PRN
  Administered 2023-11-09: 10 mL
  Filled 2023-11-09: qty 10

## 2023-11-18 ENCOUNTER — Inpatient Hospital Stay: Payer: Medicare Other | Attending: Oncology

## 2023-11-18 ENCOUNTER — Inpatient Hospital Stay: Payer: Medicare Other

## 2023-11-18 DIAGNOSIS — N184 Chronic kidney disease, stage 4 (severe): Secondary | ICD-10-CM | POA: Diagnosis not present

## 2023-11-18 DIAGNOSIS — D631 Anemia in chronic kidney disease: Secondary | ICD-10-CM | POA: Insufficient documentation

## 2023-11-18 DIAGNOSIS — D638 Anemia in other chronic diseases classified elsewhere: Secondary | ICD-10-CM

## 2023-11-18 LAB — HEMOGLOBIN AND HEMATOCRIT (CANCER CENTER ONLY)
HCT: 33.7 % — ABNORMAL LOW (ref 36.0–46.0)
Hemoglobin: 10.5 g/dL — ABNORMAL LOW (ref 12.0–15.0)

## 2023-11-18 NOTE — Progress Notes (Signed)
Hgb at 10.5; hold retacrit

## 2023-11-19 DIAGNOSIS — N184 Chronic kidney disease, stage 4 (severe): Secondary | ICD-10-CM | POA: Diagnosis not present

## 2023-11-19 DIAGNOSIS — E875 Hyperkalemia: Secondary | ICD-10-CM | POA: Diagnosis not present

## 2023-11-19 DIAGNOSIS — E1122 Type 2 diabetes mellitus with diabetic chronic kidney disease: Secondary | ICD-10-CM | POA: Diagnosis not present

## 2023-11-19 DIAGNOSIS — R809 Proteinuria, unspecified: Secondary | ICD-10-CM | POA: Diagnosis not present

## 2023-11-19 DIAGNOSIS — N2581 Secondary hyperparathyroidism of renal origin: Secondary | ICD-10-CM | POA: Diagnosis not present

## 2023-11-19 DIAGNOSIS — D631 Anemia in chronic kidney disease: Secondary | ICD-10-CM | POA: Diagnosis not present

## 2023-11-19 DIAGNOSIS — I129 Hypertensive chronic kidney disease with stage 1 through stage 4 chronic kidney disease, or unspecified chronic kidney disease: Secondary | ICD-10-CM | POA: Diagnosis not present

## 2023-11-26 DIAGNOSIS — M47812 Spondylosis without myelopathy or radiculopathy, cervical region: Secondary | ICD-10-CM | POA: Diagnosis not present

## 2023-11-26 DIAGNOSIS — M5030 Other cervical disc degeneration, unspecified cervical region: Secondary | ICD-10-CM | POA: Diagnosis not present

## 2023-11-26 DIAGNOSIS — M47896 Other spondylosis, lumbar region: Secondary | ICD-10-CM | POA: Diagnosis not present

## 2023-11-26 DIAGNOSIS — M4802 Spinal stenosis, cervical region: Secondary | ICD-10-CM | POA: Diagnosis not present

## 2023-11-26 DIAGNOSIS — M533 Sacrococcygeal disorders, not elsewhere classified: Secondary | ICD-10-CM | POA: Diagnosis not present

## 2023-12-12 LAB — HEMOGLOBIN A1C: A1c: 8.8

## 2023-12-12 NOTE — Patient Instructions (Signed)

## 2023-12-17 ENCOUNTER — Inpatient Hospital Stay: Payer: Medicare Other

## 2023-12-17 ENCOUNTER — Inpatient Hospital Stay: Payer: Medicare Other | Attending: Oncology

## 2023-12-17 DIAGNOSIS — N184 Chronic kidney disease, stage 4 (severe): Secondary | ICD-10-CM | POA: Insufficient documentation

## 2023-12-17 DIAGNOSIS — D631 Anemia in chronic kidney disease: Secondary | ICD-10-CM | POA: Insufficient documentation

## 2023-12-18 ENCOUNTER — Ambulatory Visit: Payer: Medicare Other | Admitting: Nurse Practitioner

## 2023-12-18 DIAGNOSIS — M47817 Spondylosis without myelopathy or radiculopathy, lumbosacral region: Secondary | ICD-10-CM | POA: Diagnosis not present

## 2023-12-18 DIAGNOSIS — E1140 Type 2 diabetes mellitus with diabetic neuropathy, unspecified: Secondary | ICD-10-CM

## 2023-12-18 DIAGNOSIS — G894 Chronic pain syndrome: Secondary | ICD-10-CM

## 2023-12-18 DIAGNOSIS — I5032 Chronic diastolic (congestive) heart failure: Secondary | ICD-10-CM

## 2023-12-18 DIAGNOSIS — E1169 Type 2 diabetes mellitus with other specified complication: Secondary | ICD-10-CM

## 2023-12-18 DIAGNOSIS — B0229 Other postherpetic nervous system involvement: Secondary | ICD-10-CM

## 2023-12-18 DIAGNOSIS — D638 Anemia in other chronic diseases classified elsewhere: Secondary | ICD-10-CM

## 2023-12-18 DIAGNOSIS — N2581 Secondary hyperparathyroidism of renal origin: Secondary | ICD-10-CM

## 2023-12-18 DIAGNOSIS — E1165 Type 2 diabetes mellitus with hyperglycemia: Secondary | ICD-10-CM

## 2023-12-18 DIAGNOSIS — Z794 Long term (current) use of insulin: Secondary | ICD-10-CM

## 2023-12-18 DIAGNOSIS — N184 Chronic kidney disease, stage 4 (severe): Secondary | ICD-10-CM

## 2023-12-18 DIAGNOSIS — Z8673 Personal history of transient ischemic attack (TIA), and cerebral infarction without residual deficits: Secondary | ICD-10-CM

## 2023-12-18 DIAGNOSIS — I130 Hypertensive heart and chronic kidney disease with heart failure and stage 1 through stage 4 chronic kidney disease, or unspecified chronic kidney disease: Secondary | ICD-10-CM

## 2023-12-19 NOTE — Progress Notes (Signed)
 Error

## 2023-12-22 ENCOUNTER — Ambulatory Visit: Payer: Self-pay | Admitting: *Deleted

## 2023-12-22 NOTE — Patient Outreach (Signed)
  Care Coordination   12/22/2023 Name: Tonya [name] MRN: [id number] DOB: [birth year]   Care Coordination Outreach Attempts:  An unsuccessful outreach was attempted for an appointment today.  Follow Up Plan:  Additional outreach attempts will be made to offer the patient complex care management information and services.   Encounter Outcome:  No Answer   Care Coordination Interventions:  No, not indicated    Odella Ku, RN, MSN, CCM [place]  [facility], [facility] RN Care Coordinator Direct Dial: [phone] / Main [phone] Fax [phone] Email: [email or web address] Website: [email or web address]

## 2023-12-24 DIAGNOSIS — M5030 Other cervical disc degeneration, unspecified cervical region: Secondary | ICD-10-CM | POA: Diagnosis not present

## 2023-12-24 DIAGNOSIS — M47896 Other spondylosis, lumbar region: Secondary | ICD-10-CM | POA: Diagnosis not present

## 2023-12-24 DIAGNOSIS — M4802 Spinal stenosis, cervical region: Secondary | ICD-10-CM | POA: Diagnosis not present

## 2023-12-24 DIAGNOSIS — B0229 Other postherpetic nervous system involvement: Secondary | ICD-10-CM | POA: Diagnosis not present

## 2023-12-24 DIAGNOSIS — M533 Sacrococcygeal disorders, not elsewhere classified: Secondary | ICD-10-CM | POA: Diagnosis not present

## 2023-12-24 DIAGNOSIS — M47812 Spondylosis without myelopathy or radiculopathy, cervical region: Secondary | ICD-10-CM | POA: Diagnosis not present

## 2024-01-01 ENCOUNTER — Ambulatory Visit: Payer: Self-pay | Admitting: *Deleted

## 2024-01-01 NOTE — Patient Outreach (Signed)
  Care Coordination   Follow Up Visit Note   01/01/2024 Name: Tonya [name] MRN: [id number] DOB: January 05, 1946  Tonya [name] is a 78 y.o. year old female who sees [place], Corrie Dandy T, NP for primary care. I spoke with  [name] by phone today.  What matters to the patients health and wellness today?  Patient report doing well, state she would like help to follow up on order for [facility].  Otherwise, report medical conditions remain stable.     Goals Addressed             This Visit's Progress    Management of chronic medical conditions   On track    Interventions Today    Flowsheet Row Most Recent Value  Chronic Disease   Chronic disease during today's visit Diabetes, Congestive Heart Failure (CHF), Hypertension (HTN)  General Interventions   General Interventions Discussed/Reviewed General Interventions Reviewed, Labs, Durable Medical Equipment (DME), Doctor Visits, Communication with  Labs Hgb A1c every 3 months  [Currently at 8.8]  Doctor Visits Discussed/Reviewed Doctor Visits Reviewed, PCP, Specialist  [Reviewed upcoming: PCP 1/24 and cancer center 1/29]  Durable Medical Equipment (DME) Glucomoter  Wille[facility] [place], but waiting to be approved for upgrade to [place] 3]  PCP/Specialist Visits Compliance with follow-up visit  Communication with Pharmacists  [Call placed to [facility], notified that Josephine Igo would cost her $85.  Conference call placed to [place], request will be placed for system.  If patient has not had current system for 5 years, she will need to wait for upgrade]  Exercise Interventions   Exercise Discussed/Reviewed Weight Managment  Weight Management Weight maintenance  [Encouraged to weigh daily]  Education Interventions   Education Provided Provided Education  Provided Verbal Education On Blood Sugar Monitoring, Medication, When to see the doctor, Labs  [Report blood sugar 143 today, encouraged to continue using current meter while waiting for upgrade.  Report she is  taking medications as instructed, including insulin]  Labs Reviewed Hgb A1c  [Educated on goal of less than 7]              SDOH assessments and interventions completed:  No     Care Coordination Interventions:  Yes, provided   Follow up plan: Follow up call scheduled for 1/31    Encounter Outcome:  Patient Visit Completed   Rodney Langton, RN, MSN, CCM [place]  [facility], [facility] RN Care Coordinator Direct Dial: [phone] / Main [phone] Fax [phone] Email: [email or web address]: [email or web address]

## 2024-01-01 NOTE — Patient Instructions (Signed)
Visit Information  Thank you for taking time to visit with me today. Please don't hesitate to contact me if I can be of assistance to you before our next scheduled telephone appointment.  Following are the goals we discussed today:  Continue using current meter to monitor blood sugar. Monitor weight and blood pressure daily Call [place] if you have not received glucose supplies in 2 weeks  Our next appointment is by telephone on 1/31  Please call the care guide team at [phone] if you need to cancel or reschedule your appointment.   Please call the Suicide and Crisis Lifeline: 988 call the [place] National Suicide Prevention Lifeline: (304)872-6289 or TTY: 629-432-2370 TTY (769)620-5277) to talk to a trained counselor call 1-800-273-TALK (toll free, 24 hour hotline) call 911 if you are experiencing a Mental Health or Behavioral Health Crisis or need someone to talk to.  Patient verbalizes understanding of instructions and care plan provided today and agrees to view in MyChart. Active MyChart status and patient understanding of how to access instructions and care plan via MyChart confirmed with patient.     The patient has been provided with contact information for the care management team and has been advised to call with any health related questions or concerns.   Rodney Langton, RN, MSN, CCM [facility], [facility] RN Care Coordinator Direct Dial: [phone] / Main [phone] Fax [phone] Email: [email or web address]

## 2024-01-04 DIAGNOSIS — E119 Type 2 diabetes mellitus without complications: Secondary | ICD-10-CM | POA: Insufficient documentation

## 2024-01-04 DIAGNOSIS — E1165 Type 2 diabetes mellitus with hyperglycemia: Secondary | ICD-10-CM | POA: Diagnosis not present

## 2024-01-04 NOTE — Patient Instructions (Signed)

## 2024-01-06 ENCOUNTER — Other Ambulatory Visit: Payer: Self-pay | Admitting: Cardiovascular Disease

## 2024-01-07 ENCOUNTER — Telehealth: Payer: Self-pay

## 2024-01-07 NOTE — Telephone Encounter (Signed)
Last office visit: 07/01/22 with plan to f/u 12 months. next office visit: 01/15/24  Patient has had 3rd attempt but appt is scheduled.

## 2024-01-07 NOTE — Progress Notes (Signed)
   01/07/2024  Patient ID: Tonya [name], female   DOB: 04/28/1946, 78 y.o.   MRN: [id number]  Subjective/objective Patient identified as meeting criteria for [place] [employer] regarding health equity gap between Caucasian and Black or African-American patients with A1c less than 8%.  Out reach was made to review current medications and assess for any barriers to medication access/adherence.  Diabetes -Current medications: Humalog 8 units 3 times daily before meals, pioglitazone 30 mg daily -Farxiga 10 mg daily is on patient's medication list, but she does not have this medication nor has she been taking this.  Nephrology notes from 12/5 indicate provider was going to start patient on a trial of this medication. -Patient does monitor home blood glucose regularly with freestyle libre CGM, but she is currently awaiting a shipment of the; and has been without CGM supplies for a few days. -Patient's most recent A1c 2 months ago was 8.8%  Assessment/plan  Diabetes -Currently uncontrolled with A1c greater than 7% -Contacted both [facility] and [facility] home delivery pharmacies, and neither pharmacy has a prescription on file for Farxiga 10 mg -Patient sees primary care provider tomorrow so I we will inform her that Farxiga 10 mg is appearing on medication list, but a prescription does not seem to have been sent in by nephrology.  Of note, patient's most recent EGFR is 20; it is not recommended to initiate this medication in patients with an EGFR less than 25  Lenna Gilford, PharmD, DPLA

## 2024-01-08 ENCOUNTER — Encounter: Payer: Self-pay | Admitting: Nurse Practitioner

## 2024-01-08 ENCOUNTER — Telehealth: Payer: Self-pay

## 2024-01-08 ENCOUNTER — Ambulatory Visit (INDEPENDENT_AMBULATORY_CARE_PROVIDER_SITE_OTHER): Payer: Medicare Other | Admitting: Nurse Practitioner

## 2024-01-08 VITALS — BP 138/80 | HR 69 | Temp 98.2°F | Ht 67.0 in | Wt 126.0 lb

## 2024-01-08 DIAGNOSIS — E1169 Type 2 diabetes mellitus with other specified complication: Secondary | ICD-10-CM

## 2024-01-08 DIAGNOSIS — N2581 Secondary hyperparathyroidism of renal origin: Secondary | ICD-10-CM | POA: Diagnosis not present

## 2024-01-08 DIAGNOSIS — E1140 Type 2 diabetes mellitus with diabetic neuropathy, unspecified: Secondary | ICD-10-CM | POA: Diagnosis not present

## 2024-01-08 DIAGNOSIS — E119 Type 2 diabetes mellitus without complications: Secondary | ICD-10-CM | POA: Diagnosis not present

## 2024-01-08 DIAGNOSIS — Z794 Long term (current) use of insulin: Secondary | ICD-10-CM

## 2024-01-08 DIAGNOSIS — I5032 Chronic diastolic (congestive) heart failure: Secondary | ICD-10-CM | POA: Diagnosis not present

## 2024-01-08 DIAGNOSIS — I130 Hypertensive heart and chronic kidney disease with heart failure and stage 1 through stage 4 chronic kidney disease, or unspecified chronic kidney disease: Secondary | ICD-10-CM | POA: Diagnosis not present

## 2024-01-08 DIAGNOSIS — E1165 Type 2 diabetes mellitus with hyperglycemia: Secondary | ICD-10-CM | POA: Diagnosis not present

## 2024-01-08 DIAGNOSIS — N184 Chronic kidney disease, stage 4 (severe): Secondary | ICD-10-CM | POA: Diagnosis not present

## 2024-01-08 DIAGNOSIS — G894 Chronic pain syndrome: Secondary | ICD-10-CM

## 2024-01-08 DIAGNOSIS — B0229 Other postherpetic nervous system involvement: Secondary | ICD-10-CM | POA: Diagnosis not present

## 2024-01-08 DIAGNOSIS — E785 Hyperlipidemia, unspecified: Secondary | ICD-10-CM | POA: Diagnosis not present

## 2024-01-08 DIAGNOSIS — D638 Anemia in other chronic diseases classified elsewhere: Secondary | ICD-10-CM

## 2024-01-08 LAB — MICROALBUMIN, URINE WAIVED
Creatinine, Urine Waived: 200 mg/dL (ref 10–300)
Microalb, Ur Waived: 150 mg/L — ABNORMAL HIGH (ref 0–19)
Microalb/Creat Ratio: >300 mg/g — ABNORMAL HIGH (ref <30)

## 2024-01-08 MED ORDER — PANTOPRAZOLE SODIUM 40 MG PO TBEC: 40.0000 mg | DELAYED_RELEASE_TABLET | Freq: Every day | ORAL | 4 refills | Status: DC

## 2024-01-08 MED ORDER — OLMESARTAN MEDOXOMIL 40 MG PO TABS: 40.0000 mg | ORAL_TABLET | Freq: Every day | ORAL | 4 refills | Status: DC

## 2024-01-08 MED ORDER — ROSUVASTATIN CALCIUM 5 MG PO TABS: 5.0000 mg | ORAL_TABLET | Freq: Every day | ORAL | 4 refills | Status: DC

## 2024-01-08 NOTE — Assessment & Plan Note (Signed)
Chronic, ongoing.  Continue collaboration with hematology and infusions as needed.  Recent notes reviewed.

## 2024-01-08 NOTE — Assessment & Plan Note (Signed)
Chronic, ongoing.  Continue Rosuvastatin. Continue to collaborate with [facility].  Lipid panel today.

## 2024-01-08 NOTE — Assessment & Plan Note (Signed)
Refer to diabetes with neuropathy plan of care.

## 2024-01-08 NOTE — Assessment & Plan Note (Signed)
Chronic, ongoing, followed by endocrinology.  A1c 8.8% with endo last check and urine ALB 150 January 2025. Continue current medication regimen and defer changes and A1c check to endo.  Cardiology and PCP have recommended to endo discontinuing Actos due to her HF.  Continue to collaborate with Dr. Tedd Sias and Elnita Maxwell PharmD. Recommend she monitor BS at least 3 times a day at home, continue consistent [place] use.  Return in 6 months as is being followed closely by endo. - ARB and Statin on board - Vaccinations up to date - Eye exam due, foot exam up to date

## 2024-01-08 NOTE — Assessment & Plan Note (Signed)
Chronic, ongoing with EF 60-65%, euvolemic at this time.  Continue current medication regimen and collaboration with cardiology + Elnita Maxwell PharmD. - Reminded to call for an overnight weight gain of >2 pounds or a weekly weight weight of >5 pounds -- work with CCM team on obtaining scale. - not adding salt to his food and has been reading food labels. Reviewed the importance of keeping daily sodium intake to 2000mg  daily  - Wear compression hose daily -- not on today and not consistently wearing - No Ibuprofen at home

## 2024-01-08 NOTE — Assessment & Plan Note (Signed)
Chronic, stable.  BP at goal in office today on recheck.  Continue current medication regimen and collaboration with nephrology.  Labs: CBC. Urine ALB 150 January 2025.  Recommend she continue to monitor BP at home daily and document for providers + focus on DASH diet.  Continue collaboration with cardiology and nephrology.

## 2024-01-08 NOTE — Assessment & Plan Note (Signed)
Chronic, ongoing.  Continue current medication regimen and collaboration with nephrology. Labs up to date and reviewed in Care Everywhere.

## 2024-01-08 NOTE — Assessment & Plan Note (Signed)
Chronic, ongoing.  Monitor closely due to AM low BS.  Continue collaboration with endocrinology.

## 2024-01-08 NOTE — Progress Notes (Signed)
BP 138/80 (BP Location: Left Arm, Patient Position: Sitting, Cuff Size: Normal)   Pulse 69   Temp 98.2 F (36.8 C) (Oral)   Ht 5\' 7"  (1.702 m)   Wt 126 lb (57.2 kg)   LMP  (LMP Unknown)   SpO2 99%   BMI 19.73 kg/m    Subjective:    Patient ID: Tonya [name], female    DOB: [birth year], 78 y.o.   MRN: [id number]  HPI: Tonya [name] is a 78 y.o. female  Chief Complaint  Patient presents with   Congestive Heart Failure   Diabetes    No recent eye exam per patient   Hyperlipidemia   Hypertension   DIABETES Follows with endocrinology, last visit 10/27/23.  A1c 8.8%.  They are working on Clinical biochemist for patient.  Has lost some weight, but has been having work done on her teeth.   Currently taking Humalog (8 units) and Actos.  Previously has tried Toujeo and Trulicity -- did not tolerate these.  Nephrology wanted her to start Farxiga, but was too expensive.  Following with Elnita Maxwell PharmD, reports recent eGFR 20 and she will talk to nephrology about whether to start. Polydipsia/polyuria: no Visual disturbance: no Chest pain: no Paresthesias: no Glucose Monitoring: yes             Accucheck frequency: TID              Fasting glucose: this morning 90 range (have been 90 to 100 range)             Post prandial:             Evening:              Before meals:              Taking Insulin?: yes             Long acting insulin: None, endo stopped             Short acting insulin: Humalog 8 units in morning and then 8 units at lunch and dinner Blood Pressure Monitoring: a few times a week Retinal Examination: Up to Date -- [facility]  Foot Exam: Up to Date Pneumovax: Up to Date Influenza: Up to Date Aspirin: yes    HYPERTENSION / HYPERLIPIDEMIA/HF Follows with cardiology, last visit 07/01/22, she is to see them upcoming on the 23rd.  Taking Carvedilol, ASA, Torsemide, Olmesartan, and Crestor.    LVEF on 12/02/2019 was 60 to 65% with moderate LVH, and grade 1 diastolic  dysfunction. Satisfied with current treatment? yes Duration of hypertension: chronic BP monitoring frequency: a few times a week BP range: 134/80 on recent home check BP medication side effects: no Duration of hyperlipidemia: chronic Cholesterol medication side effects: no Cholesterol supplements: none Medication compliance: good compliance Aspirin: yes Recent stressors: no Recurrent headaches: no Visual changes: no Palpitations: no Dyspnea: no Chest pain: no Lower extremity edema: at baseline, small amount Dizzy/lightheaded: no    CHRONIC KIDNEY DISEASE Follows with nephrology, last visit 11/19/23, and hematology for anemia in CKD, last seen 10/21/23, did not need recent injection.   CKD status: stable Medications renally dose: yes Previous renal evaluation: yes Pneumovax:  Up to Date Influenza Vaccine:  Up to Date   POSTHERPETIC NEURALGIA Saw pain clinic for injections in past, last visit 04/16/23.  [facility]aw [facility] in past reported they could do surgery but there was no guarantee.  Previously took Gabapentin back when first  had shingles in 2008 or 2009, but was higher dose and caused speech issues per patient, Lyrica also caused side effects. Has known degenerative disc to spine.   Neuropathy status: stable Satisfied with current treatment?: no Location: right hip and mid back Pain: yes Severity: 3/10 Quality:  dull, aching, and throbbing Frequency: intermittent Bilateral: no Symmetric: no Numbness: yes Decreased sensation: no Weakness: no Context: fluctuating Alleviating factors: nothing Aggravating factors: movement Treatments attempted: injections, multiple medications = Gabapentin, Lyrica, Lidocaine patches, Capsaicin cream, Tylenol     01/08/2024    2:28 PM 07/06/2023    8:38 AM 06/15/2023    3:35 PM 02/17/2023    2:58 PM 12/22/2022    3:13 PM  Depression screen PHQ 2/9  Decreased Interest 0 0 0 0 1  Down, Depressed, Hopeless 0 0 0 0 0  PHQ - 2 Score 0 0 0 0 1   Altered sleeping 2 1 0 0 1  Tired, decreased energy 1 1 0 0 2  Change in appetite 2 1 0 0 3  Feeling bad or failure about yourself  0 2 0 0 1  Trouble concentrating 1 0 0 0 0  Moving slowly or fidgety/restless 0 1 0 0 1  Suicidal thoughts 0 0 0 0 0  PHQ-9 Score 6 6 0 0 9  Difficult doing work/chores Not difficult at all Somewhat difficult Not difficult at all         01/08/2024    2:28 PM 07/06/2023    8:39 AM 06/15/2023    3:35 PM 12/22/2022    3:13 PM  GAD 7 : Generalized Anxiety Score  Nervous, Anxious, on Edge 0 0 0 0  Control/stop worrying 0 1 0 1  Worry too much - different things 1 1 0 1  Trouble relaxing 0 0 0 0  Restless 0 1 0 0  Easily annoyed or irritable 0 0 0 0  Afraid - awful might happen 0 0 0 0  Total GAD 7 Score 1 3 0 2  Anxiety Difficulty Not difficult at all Somewhat difficult Not difficult at all Not difficult at all      Relevant past medical, surgical, family and social history reviewed and updated as indicated. Interim medical history since our last visit reviewed. Allergies and medications reviewed and updated.  Review of Systems  Constitutional:  Negative for activity change, appetite change, diaphoresis, fatigue and fever.  Respiratory:  Negative for cough, chest tightness, shortness of breath and wheezing.   Cardiovascular:  Negative for chest pain, palpitations and leg swelling.  Gastrointestinal: Negative.   Musculoskeletal:  Positive for arthralgias.  Neurological: Negative.   Psychiatric/Behavioral: Negative.      Per HPI unless specifically indicated above     Objective:    BP 138/80 (BP Location: Left Arm, Patient Position: Sitting, Cuff Size: Normal)   Pulse 69   Temp 98.2 F (36.8 C) (Oral)   Ht 5\' 7"  (1.702 m)   Wt 126 lb (57.2 kg)   LMP  (LMP Unknown)   SpO2 99%   BMI 19.73 kg/m   Wt Readings from Last 3 Encounters:  01/08/24 126 lb (57.2 kg)  10/21/23 135 lb 8 oz (61.5 kg)  09/22/23 126 lb (57.2 kg)    Physical  Exam Vitals and nursing note reviewed.  Constitutional:      General: She is awake. She is not in acute distress.    Appearance: She is well-developed. She is not ill-appearing.  HENT:  Head: Normocephalic.     Right Ear: Hearing normal.     Left Ear: Hearing normal.  Eyes:     General: Lids are normal.        Right eye: No discharge.        Left eye: No discharge.     Conjunctiva/sclera: Conjunctivae normal.     Pupils: Pupils are equal, round, and reactive to light.  Neck:     Thyroid: No thyromegaly.     Vascular: No carotid bruit.  Cardiovascular:     Rate and Rhythm: Normal rate and regular rhythm.     Heart sounds: Normal heart sounds. No murmur heard.    No gallop.  Pulmonary:     Effort: Pulmonary effort is normal. No accessory muscle usage or respiratory distress.     Breath sounds: Normal breath sounds.  Abdominal:     General: Bowel sounds are normal. There is no distension.     Palpations: Abdomen is soft.     Tenderness: There is no abdominal tenderness.  Musculoskeletal:     Cervical back: Normal range of motion and neck supple.     Right lower leg: Edema (trace) present.     Left lower leg: Edema (trace) present.  Lymphadenopathy:     Cervical: No cervical adenopathy.  Skin:    General: Skin is warm and dry.     Findings: No rash.  Neurological:     Mental Status: She is alert and oriented to person, place, and time.  Psychiatric:        Attention and Perception: Attention normal.        Mood and Affect: Mood normal.        Behavior: Behavior normal. Behavior is cooperative.        Thought Content: Thought content normal.        Judgment: Judgment normal.    Diabetic Foot Exam - Simple   Simple Foot Form Visual Inspection No deformities, no ulcerations, no other skin breakdown bilaterally: Yes See comments: Yes Sensation Testing See comments: Yes Pulse Check Posterior Tibialis and Dorsalis pulse intact bilaterally: Yes Comments Dry skin bot  feet.  Sensation 6/10 both feet.     Results for orders placed or performed in visit on 01/08/24  Microalbumin, Urine Waived   Collection Time: 01/08/24  3:06 PM  Result Value Ref Range   Microalb, Ur Waived 150 (H) 0 - 19 mg/L   Creatinine, Urine Waived 200 10 - 300 mg/dL   Microalb/Creat Ratio >300 (H) <30 mg/g      Assessment & Plan:   Problem List Items Addressed This Visit       Cardiovascular and Mediastinum   Chronic heart failure with preserved ejection fraction (HFpEF) (HCC)   Chronic, ongoing with EF 60-65%, euvolemic at this time.  Continue current medication regimen and collaboration with cardiology + Elnita Maxwell PharmD. - Reminded to call for an overnight weight gain of >2 pounds or a weekly weight weight of >5 pounds -- work with CCM team on obtaining scale. - not adding salt to his food and has been reading food labels. 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  BP at goal in office today on recheck.  Continue current medication regimen and collaboration with nephrology.  Labs: CBC. Urine ALB 150 January 2025.  Recommend she continue to monitor BP at home daily and document for providers + focus on DASH diet.  Continue collaboration with cardiology and nephrology.        Relevant Medications   olmesartan (BENICAR) 40 MG tablet   rosuvastatin (CRESTOR) 5 MG tablet   Other Relevant Orders   Microalbumin, Urine Waived (Completed)   TSH   CBC with Differential/Platelet     Endocrine   Diabetes mellitus type 2, insulin dependent (HCC)   Refer to diabetes with neuropathy plan of care.      Relevant Medications   olmesartan (BENICAR) 40 MG tablet   rosuvastatin  (CRESTOR) 5 MG tablet   Hyperlipidemia associated with type 2 diabetes mellitus (HCC)   Chronic, ongoing.  Continue Rosuvastatin. Continue to collaborate with [facility].   Lipid panel today.      Relevant Medications   olmesartan (BENICAR) 40 MG tablet   rosuvastatin (CRESTOR) 5 MG tablet   Other Relevant Orders   Lipid Panel w/o Chol/HDL Ratio   Hyperparathyroidism due to renal insufficiency (HCC)   Chronic, ongoing, followed by nephrology with recent labs obtained.  Continue collaboration with nephrology.   Recent note and labs reviewed.        Poorly controlled type 2 diabetes mellitus with neuropathy (HCC) - Primary   Chronic, ongoing, followed by endocrinology.  A1c 8.8% with endo last check and urine ALB 150 January 2025. Continue current medication regimen and defer changes and A1c check to endo.  Cardiology and PCP have recommended to endo discontinuing Actos due to her HF.  Continue to collaborate with Dr. Tedd Sias and Elnita Maxwell PharmD. Recommend she monitor BS at least 3 times a day at home, continue consistent [place] use.  Return in 6 months as is being followed closely by endo. - ARB and Statin on board - Vaccinations up to date - Eye exam due, foot exam up to date      Relevant Medications   olmesartan (BENICAR) 40 MG tablet   rosuvastatin (CRESTOR) 5 MG tablet   Other Relevant Orders   Microalbumin, Urine Waived (Completed)     Nervous and Auditory   Post herpetic neuralgia   Chronic with poor tolerance to Gabapentin higher doses in past and Lyrica at doses >25 MG. Currently not taking low dose Gabapentin, which she tolerated, will return to this as needed.  Continue to follow with pain management and ortho as needed, appreciate their input.  She is aware if any side effects to immediately stop taking and alert provider.        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  Continue current medication regimen and  collaboration with nephrology. Labs up to date and reviewed in Care Everywhere.         Other   Anemia of chronic disease (Chronic)   Chronic, ongoing.  Continue collaboration with hematology and infusions as needed.  Recent notes reviewed.      Encounter for long-term (current) use of insulin (HCC)   Chronic, ongoing.  Monitor closely due to AM low BS.  Continue collaboration with endocrinology.        Follow up plan: Return in about 6 months (around 07/07/2024) for T2DM, HTN/HLD, PAIN, CKD.

## 2024-01-08 NOTE — Assessment & Plan Note (Signed)
Chronic with poor tolerance to Gabapentin higher doses in past and Lyrica at doses >25 MG. Currently not taking low dose Gabapentin, which she tolerated, will return to this as needed.  Continue to follow with pain management and ortho as needed, appreciate their input.  She is aware if any side effects to immediately stop taking and alert provider.

## 2024-01-08 NOTE — Assessment & Plan Note (Signed)
Chronic, ongoing, followed by nephrology with recent labs obtained.  Continue collaboration with nephrology.   Recent note and labs reviewed.

## 2024-01-08 NOTE — Progress Notes (Signed)
   01/08/2024  Patient ID: Tonya [name], female   DOB: 01-Nov-1946, 78 y.o.   MRN: [id number]  Out reach attempt to [facility] to verify if patient is to be taking Marcelline Deist was unsuccessful.  The office is closed today, so I we will attempt to reach them again on Monday.  Lenna Gilford, PharmD, DPLA

## 2024-01-09 ENCOUNTER — Encounter: Payer: Self-pay | Admitting: Nurse Practitioner

## 2024-01-09 ENCOUNTER — Other Ambulatory Visit: Payer: Self-pay | Admitting: Nurse Practitioner

## 2024-01-09 LAB — CBC WITH DIFFERENTIAL/PLATELET
Basophils Absolute: 0 10*3/uL (ref 0.0–0.2)
Basos: 0 %
EOS (ABSOLUTE): 0.1 10*3/uL (ref 0.0–0.4)
Eos: 2 %
Hematocrit: 33.6 % — ABNORMAL LOW (ref 34.0–46.6)
Hemoglobin: 10.2 g/dL — ABNORMAL LOW (ref 11.1–15.9)
Immature Grans (Abs): 0 10*3/uL (ref 0.0–0.1)
Immature Granulocytes: 1 %
Lymphocytes Absolute: 1.3 10*3/uL (ref 0.7–3.1)
Lymphs: 22 %
MCH: 26.8 pg (ref 26.6–33.0)
MCHC: 30.4 g/dL — ABNORMAL LOW (ref 31.5–35.7)
MCV: 88 fL (ref 79–97)
Monocytes Absolute: 0.4 10*3/uL (ref 0.1–0.9)
Monocytes: 7 %
Neutrophils Absolute: 4.2 10*3/uL (ref 1.4–7.0)
Neutrophils: 68 %
Platelets: 280 10*3/uL (ref 150–450)
RBC: 3.8 x10E6/uL (ref 3.77–5.28)
RDW: 13.4 % (ref 11.7–15.4)
WBC: 6.2 10*3/uL (ref 3.4–10.8)

## 2024-01-09 LAB — LIPID PANEL W/O CHOL/HDL RATIO
Cholesterol, Total: 205 mg/dL — ABNORMAL HIGH (ref 100–199)
HDL: 88 mg/dL (ref >39)
LDL Chol Calc (NIH): 95 mg/dL (ref 0–99)
Triglycerides: 129 mg/dL (ref 0–149)
VLDL Cholesterol Cal: 22 mg/dL (ref 5–40)

## 2024-01-09 LAB — TSH: TSH: 2.3 u[IU]/mL (ref 0.450–4.500)

## 2024-01-09 MED ORDER — ROSUVASTATIN CALCIUM 10 MG PO TABS: 10.0000 mg | ORAL_TABLET | Freq: Every day | ORAL | 4 refills | Status: DC

## 2024-01-09 NOTE — Progress Notes (Signed)
Contacted via MyChart   Good morning Tonya [name], your labs have returned: - Lipid panel overall stable, but would like to see LDL a bit lower, I am going to send in Rosuvastatin 10 MG and see if you tolerate this, if not then return to 5 MG dosing. - CBC continues to show some anemia, but this is stable -- continue to visit with kidney provider and hematology. - Thyroid level normal.  Any questions? Keep being amazing!!  Thank you for allowing me to participate in your care.  I appreciate you. Kindest regards, Chimene Salo

## 2024-01-11 NOTE — Progress Notes (Signed)
   01/11/2024  Patient ID: Tonya [name], female   DOB: [birth year], 78 y.o.   MRN: [id number]  Out reach attempt to [facility] to verify if patient is to be taking Marcelline Deist, but was only able to leave a voicemail for the provider's medical assistant.  Will await a call back, and I will reach out again in 1-2 business days if I do not hear anything back.  Lenna Gilford, PharmD, DPLA

## 2024-01-13 ENCOUNTER — Inpatient Hospital Stay: Payer: Medicare Other

## 2024-01-13 ENCOUNTER — Other Ambulatory Visit: Payer: Self-pay | Admitting: Oncology

## 2024-01-13 ENCOUNTER — Telehealth: Payer: Self-pay

## 2024-01-13 DIAGNOSIS — D638 Anemia in other chronic diseases classified elsewhere: Secondary | ICD-10-CM

## 2024-01-13 DIAGNOSIS — N184 Chronic kidney disease, stage 4 (severe): Secondary | ICD-10-CM | POA: Diagnosis not present

## 2024-01-13 DIAGNOSIS — D631 Anemia in chronic kidney disease: Secondary | ICD-10-CM | POA: Diagnosis not present

## 2024-01-13 LAB — HEMOGLOBIN AND HEMATOCRIT (CANCER CENTER ONLY)
HCT: 33.2 % — ABNORMAL LOW (ref 36.0–46.0)
Hemoglobin: 10.3 g/dL — ABNORMAL LOW (ref 12.0–15.0)

## 2024-01-13 NOTE — Progress Notes (Signed)
Patient didn't need her Retacrit today, I talked to Dr. Cathie Hoops and she said that if it is above 10.0 then she doesn't need to get the injection.

## 2024-01-14 NOTE — Progress Notes (Signed)
   01/14/2024  Patient ID: Tonya [name], female   DOB: [birth year], 78 y.o.   MRN: [id number]  Danielle from Dr. Alice Rieger nephrology office returned my call to clarify Farxiga.  Nephrologist does want the patient on Farxiga 10 mg daily.  This medication is not affordable for the patient, so the practice has submitted a patient assistance application.  I will follow-up with the patient next week to see if she has heard anything from the patient assistance program or nephrology and also verify she has received her libre sensors in the mail.  Lenna Gilford, PharmD, DPLA

## 2024-01-14 NOTE — Progress Notes (Signed)
Cardiology Clinic Note   Date: 01/15/2024 ID: Tonya [name], DOB March 12, 1946, MRN [id number]  Primary Cardiologist:  Julien Nordmann, MD   Chief Complaint   Tonya [name] is a 78 y.o. female who presents to the clinic today for overdue routine follow-up.  Patient Profile   Tonya [name] is followed by Dr. Mariah Milling for the history outlined below.      Past medical history significant for: Chronic HFpEF. Echo 12/02/2019: EF 60 to 65%.  No RWMA.  Moderate LVH.  Grade I DD.  Normal RV size/function. Hypertension. Hyperlipidemia. Lipid panel 01/08/2024: LDL 95, HDL 88, TG 129, total 205. T2DM. Hiatal hernia. CVA. CKD stage IV.  In summary, patient was first evaluated by Dr. Mariah Milling on 05/04/2017 for DOE at the request of Dr. Jamesetta Orleans.  Echo June 2018 showed EF 55 to 60%, mild LVH, Grade I DD, mild LAE.  Patient was last seen in the office by Dr. Mariah Milling on 07/01/2022 for routine follow-up.  She was doing well at that time with stable dyspnea and lower extremity edema.  No medication changes were made.     History of Present Illness    Today, patient is here alone.  She is doing well. Patient denies shortness of breath, dyspnea on exertion, lower extremity edema, orthopnea or PND. No chest pain, pressure, or tightness. No palpitations.  She has been sedentary other than doing some light housework and occasional grocery shopping.  She has no cardiac concerns or complaints today.     ROS: All other systems reviewed and are otherwise negative except as noted in History of Present Illness.  EKGs/Labs Reviewed    EKG Interpretation Date/Time:  Friday January 15 2024 11:31:21 EST Ventricular Rate:  90 PR Interval:  146 QRS Duration:  84 QT Interval:  358 QTC Calculation: 437 R Axis:   -29  Text Interpretation: Normal sinus rhythm Possible Anterior infarct , age undetermined When compared with ECG of 26-Aug-2022 13:14, No significant change was found Confirmed by Carlos Levering [phone]) on 01/15/2024 11:40:14 AM   07/31/2023: ALT 14; AST 18 08/05/2023: BUN 34; Creatinine, Ser 2.25; Potassium 4.4; Sodium 140   01/08/2024: WBC 6.2 01/13/2024: Hemoglobin 10.3   01/08/2024: TSH 2.300   Risk Assessment/Calculations      HYPERTENSION CONTROL Vitals:   01/15/24 1124 01/15/24 1156  BP: (!) 160/90 (!) 140/78    The patient's blood pressure is elevated above target today.  In order to address the patient's elevated BP: Blood pressure will be monitored at home to determine if medication changes need to be made.           Physical Exam    VS:  BP (!) 140/78 (BP Location: Left Arm, Patient Position: Sitting, Cuff Size: Normal)   Pulse 90   Ht 5\' 7"  (1.702 m)   Wt 125 lb 9.6 oz (57 kg)   LMP  (LMP Unknown)   SpO2 98%   BMI 19.67 kg/m  , BMI Body mass index is 19.67 kg/m.  GEN: Well nourished, well developed, in no acute distress. Neck: No JVD or carotid bruits. Cardiac:  RRR. No murmurs. No rubs or gallops.   Respiratory:  Respirations regular and unlabored. Clear to auscultation without rales, wheezing or rhonchi. GI: Soft, nontender, nondistended. Extremities: Radials/DP/PT 2+ and equal bilaterally. No clubbing or cyanosis. No edema.  Skin: Warm and dry, no rash. Neuro: Strength intact.  Assessment & Plan   Chronic HFpEF Echo December 2020 showed EF 60 to 65%,  moderate LVH, Grade I DD.  Patient denies shortness of breath, DOE, orthopnea, PND, lower extremity edema.  Euvolemic and well compensated on exam. -Continue carvedilol, olmesartan, torsemide.  Hypertension BP today 160/90 on intake and 140/78 on my recheck.  No reports of headaches or dizziness. -Continue carvedilol, olmesartan.  Hyperlipidemia LDL January 2025 95, not at goal.  Rosuvastatin recently increased to 10 mg by PCP. -Continue rosuvastatin. -Followed by PCP.  Disposition: Return in 1 year or sooner as needed.         Signed, Etta Grandchild. Apphia Cropley, DNP, NP-C

## 2024-01-15 ENCOUNTER — Ambulatory Visit: Payer: Medicare Other | Attending: Student | Admitting: Student

## 2024-01-15 ENCOUNTER — Encounter: Payer: Self-pay | Admitting: Student

## 2024-01-15 ENCOUNTER — Ambulatory Visit: Payer: Self-pay | Admitting: *Deleted

## 2024-01-15 VITALS — BP 140/78 | HR 90 | Ht 67.0 in | Wt 125.6 lb

## 2024-01-15 DIAGNOSIS — I10 Essential (primary) hypertension: Secondary | ICD-10-CM | POA: Diagnosis not present

## 2024-01-15 DIAGNOSIS — E785 Hyperlipidemia, unspecified: Secondary | ICD-10-CM

## 2024-01-15 DIAGNOSIS — I5032 Chronic diastolic (congestive) heart failure: Secondary | ICD-10-CM | POA: Diagnosis not present

## 2024-01-15 NOTE — Patient Instructions (Signed)
Medication Instructions:   Your physician recommends that you continue on your current medications as directed. Please refer to the Current Medication list given to you today.  *If you need a refill on your cardiac medications before your next appointment, please call your pharmacy*   Lab Work:  No labs ordered today  If you have labs (blood work) drawn today and your tests are completely normal, you will receive your results only by: MyChart Message (if you have MyChart) OR A paper copy in the mail If you have any lab test that is abnormal or we need to change your treatment, we will call you to review the results.   Testing/Procedures:  No test ordered today    Follow-Up: At [facility], you and your health needs are our priority.  As part of our continuing mission to provide you with exceptional heart care, we have created designated Provider Care Teams.  These Care Teams include your primary Cardiologist (physician) and Advanced Practice Providers (APPs -  Physician Assistants and Nurse Practitioners) who all work together to provide you with the care you need, when you need it.  Your next appointment:   1 year(s)  Provider:   You may see Julien Nordmann, MD or one of the following Advanced Practice Providers on your designated Care Team:   Nicolasa Ducking, NP Eula Listen, PA-C Cadence Fransico Michael, PA-C Charlsie Quest, NP Carlos Levering, NP

## 2024-01-15 NOTE — Patient Outreach (Signed)
  Care Coordination   Follow Up Visit Note   01/15/2024 Name: Tonya [name] MRN: [id number] DOB: [birth year]  Tonya [name] is a 78 y.o. year old female who sees [place], Corrie Dandy T, NP for primary care. I spoke with  [name] by phone today.  What matters to the patients health and wellness today?  Patient report she is doing well, still working on obtaining the [place] 3.     Goals Addressed             This Visit's Progress    Management of chronic medical conditions   On track    Interventions Today    Flowsheet Row Most Recent Value  Chronic Disease   Chronic disease during today's visit Diabetes, Congestive Heart Failure (CHF)  General Interventions   General Interventions Discussed/Reviewed Doctor Visits, General Interventions Reviewed, Durable Medical Equipment (DME)  Doctor Visits Discussed/Reviewed Doctor Visits Reviewed, PCP, Specialist  [reviewed upcoming: cardiology today, pharmacy team on 2/5, and endocrinology 2/20]  Durable Medical Equipment (DME) Glucomoter  [Still does not know if she has been approved for the libre 3, advised to call [place] to follow up]  PCP/Specialist Visits Compliance with follow-up visit  Education Interventions   Education Provided Provided Education  Provided Verbal Education On When to see the doctor, Other  [Confirmed patient has contact information for [employer], she will call to inquire about Josephine Igo 3]              SDOH assessments and interventions completed:  No     Care Coordination Interventions:  Yes, provided   Follow up plan: Follow up call scheduled for 2/28    Encounter Outcome:  Patient Visit Completed   Rodney Langton, RN, MSN, CCM [place]  [facility], [facility] RN Care Coordinator Direct Dial: [phone] / Main [phone] Fax [phone] Email: [email or web address] Website: [email or web address]

## 2024-01-20 ENCOUNTER — Other Ambulatory Visit: Payer: Self-pay

## 2024-01-20 NOTE — Progress Notes (Unsigned)
   01/20/2024  Patient ID: Tonya [name] Sharps, female   DOB: 1946/07/09, 78 y.o.   MRN: [id number]  Outreach attempt to follow-up with the patient regarding Farxiga and freestyle libre.  I was not able to reach the patient, but I did leave a HIPAA compliant voicemail with my direct phone number for her to return my call.  She if I do not hear back from the patient, I will attempt to reach her again in the next few days.  [name], PharmD, DPLA

## 2024-01-25 ENCOUNTER — Other Ambulatory Visit: Payer: Self-pay

## 2024-01-25 NOTE — Progress Notes (Signed)
   01/25/2024  Patient ID: Tonya [name], female   DOB: 04-28-1946, 78 y.o.   MRN: [id number]  Subjective/objective Telephone follow-up to verify patient received the libre sensors and discussed Farxiga  Diabetes -Patient has received freestyle libre 14-day sensors, but these apparently had to be changed from [place] 3 based on availability -Inform patient that nephrology does want her taking Farxiga, and the practice plans to help get her enrolled in the patient assistance program.  As of now, patient has not heard anything from nephrology about the [place].  Assessment/plan  Diabetes -Patient sees nephrology again 3/13 and endocrinology again 2/20.  I suggested making both providers aware that she currently is not taking Farxiga.  Follow-up: Will follow-up with the patient again in mid March after she is seen by nephrology and endocrinology  Linn Rich, PharmD, DPLA

## 2024-02-05 ENCOUNTER — Encounter: Payer: Self-pay | Admitting: Oncology

## 2024-02-12 ENCOUNTER — Ambulatory Visit: Payer: Self-pay | Admitting: *Deleted

## 2024-02-12 NOTE — Patient Outreach (Signed)
 Care Coordination   02/12/2024 Name: Tonya [name] MRN: [id number] DOB: [birth year]   Care Coordination Outreach Attempts:  An unsuccessful outreach was attempted for an appointment today.  Follow Up Plan:  Additional outreach attempts will be made to offer the patient complex care management information and services.   Encounter Outcome:  No Answer   Care Coordination Interventions:  No, not indicated    Rodney Langton, RN, MSN, CCM [place]  [facility], [facility] RN Care Coordinator Direct Dial: [phone] / Main [phone] Fax [phone] Email: [email or web address]: [email or web address]

## 2024-02-22 ENCOUNTER — Encounter: Payer: Self-pay | Admitting: Oncology

## 2024-02-22 ENCOUNTER — Inpatient Hospital Stay: Payer: Medicare Other

## 2024-02-22 ENCOUNTER — Ambulatory Visit

## 2024-02-22 ENCOUNTER — Other Ambulatory Visit

## 2024-02-23 ENCOUNTER — Telehealth: Payer: Self-pay | Admitting: Nurse Practitioner

## 2024-02-23 ENCOUNTER — Ambulatory Visit (INDEPENDENT_AMBULATORY_CARE_PROVIDER_SITE_OTHER): Payer: Medicare Other

## 2024-02-23 VITALS — Ht 67.0 in | Wt 119.0 lb

## 2024-02-23 DIAGNOSIS — E1140 Type 2 diabetes mellitus with diabetic neuropathy, unspecified: Secondary | ICD-10-CM

## 2024-02-23 DIAGNOSIS — Z0000 Encounter for general adult medical examination without abnormal findings: Secondary | ICD-10-CM | POA: Diagnosis not present

## 2024-02-23 DIAGNOSIS — Z794 Long term (current) use of insulin: Secondary | ICD-10-CM

## 2024-02-23 DIAGNOSIS — E1169 Type 2 diabetes mellitus with other specified complication: Secondary | ICD-10-CM

## 2024-02-23 DIAGNOSIS — Z1231 Encounter for screening mammogram for malignant neoplasm of breast: Secondary | ICD-10-CM

## 2024-02-23 DIAGNOSIS — Z780 Asymptomatic menopausal state: Secondary | ICD-10-CM | POA: Diagnosis not present

## 2024-02-23 NOTE — Patient Instructions (Signed)
 Tonya [name] , Thank you for taking time to come for your Medicare Wellness Visit. I appreciate your ongoing commitment to your health goals. Please review the following plan we discussed and let me know if I can assist you in the future.   Referrals/Orders/Follow-Ups/Clinician Recommendations: Remember to call [facility] to set up an eye exam appointment. I have ordered a Bone Density and Mammogram for you. Stop by the pharmacy to ask about Shingrix, Covid, RSV and Flu vaccines.   This is a list of the screening recommended for you and due dates:  Health Maintenance  Topic Date Due   DEXA scan (bone density measurement)  03/11/2022   Mammogram  01/07/2023   Eye exam for diabetics  06/07/2023   COVID-19 Vaccine (4 - 2024-25 season) 08/16/2023   Zoster (Shingles) Vaccine (1 of 2) 03/11/2024*   Flu Shot  03/14/2024*   Hemoglobin A1C  04/25/2024   Yearly kidney function blood test for diabetes  08/04/2024   Yearly kidney health urinalysis for diabetes  01/07/2025   Complete foot exam   01/07/2025   Medicare Annual Wellness Visit  02/22/2025   DTaP/Tdap/Td vaccine (3 - Td or Tdap) 10/10/2026   Pneumonia Vaccine  Completed   Hepatitis C Screening  Completed   HPV Vaccine  Aged Out   Colon Cancer Screening  Discontinued  *Topic was postponed. The date shown is not the original due date.    Advanced directives: (Copy Requested) Please bring a copy of your health care power of attorney and living will to the office to be added to your chart at your convenience. You can mail to [facility] [address] [place], [place] [address] or email to [email or web address]  Next Medicare Annual Wellness Visit scheduled for next year: Yes

## 2024-02-23 NOTE — Progress Notes (Signed)
 Subjective:   Tonya [name] is a 78 y.o. who presents for a Medicare Wellness preventive visit.  Visit Complete: Virtual I connected with  [name] on 02/23/24 by a audio enabled telemedicine application and verified that I am speaking with the correct person using two identifiers.  Patient Location: Home  Provider Location: Office/Clinic  I discussed the limitations of evaluation and management by telemedicine. The patient expressed understanding and agreed to proceed.  Vital Signs: Because this visit was a virtual/telehealth visit, some criteria may be missing or patient reported. Any vitals not documented were not able to be obtained and vitals that have been documented are patient reported.  VideoDeclined- This patient declined Librarian, academic. Therefore the visit was completed with audio only.  AWV Questionnaire: No: Patient Medicare AWV questionnaire was not completed prior to this visit.  Cardiac Risk Factors include: advanced age (>69men, >72 women);diabetes mellitus;dyslipidemia;hypertension;Other (see comment), Risk factor comments: underweight, CHF     Objective:    Today's Vitals   02/23/24 1312 02/23/24 1314  Weight: 119 lb (54 kg)   Height: 5\' 7"  (1.702 m)   PainSc:  1    Body mass index is 18.64 kg/m.     02/23/2024    1:40 PM 10/21/2023    1:21 PM 08/01/2023    2:55 AM 07/31/2023   10:43 AM 07/07/2023    1:04 PM 03/04/2023   10:57 AM 02/17/2023    3:01 PM  Advanced Directives  Does Patient Have a Medical Advance Directive? No No No No No No No  Would patient like information on creating a medical advance directive? No - Patient declined No - Patient declined No - Patient declined  No - Patient declined No - Patient declined No - Patient declined    Current Medications (verified) Outpatient Encounter Medications as of 02/23/2024  Medication Sig   acetaminophen (TYLENOL) 650 MG CR tablet Take 650 mg by mouth every 8  (eight) hours as needed for pain.   aspirin 81 MG tablet Take 81 mg by mouth daily.   carvedilol (COREG) 3.125 MG tablet Take 1 tablet (3.125 mg total) by mouth 2 (two) times daily with a meal. PLEASE CALL [phone] TO SCHEDULE YEARLY APPOINTMENT FOR FURTHER REFILLS.  (THIRD ATTEMPT)   Continuous Glucose Sensor (FREESTYLE LIBRE 14 DAY SENSOR) MISC by Does not apply route.   dapagliflozin propanediol (FARXIGA) 10 MG TABS tablet Take 10 mg by mouth daily.   diclofenac Sodium (VOLTAREN) 1 % GEL Apply 4 g topically 4 (four) times daily.   docusate sodium (COLACE) 100 MG capsule Take 100 mg by mouth daily as needed for mild constipation.   insulin lispro (HUMALOG) 100 UNIT/ML injection Inject 8 Units into the skin 3 (three) times daily before meals.   meclizine (ANTIVERT) 12.5 MG tablet Take 12.5 mg by mouth 3 (three) times daily.   NOVOFINE 32G X 6 MM MISC USE AS DIRECTED THREE TIMES DAILY WITH HUMALOG   olmesartan (BENICAR) 40 MG tablet Take 1 tablet (40 mg total) by mouth daily.   pantoprazole (PROTONIX) 40 MG tablet Take 1 tablet (40 mg total) by mouth daily.   pioglitazone (ACTOS) 30 MG tablet Take 30 mg by mouth daily.   polyethylene glycol (MIRALAX / GLYCOLAX) 17 g packet Take 17 g by mouth 2 (two) times daily.   rosuvastatin (CRESTOR) 10 MG tablet Take 1 tablet (10 mg total) by mouth daily.   simethicone (MYLICON) 80 MG chewable tablet Chew 1 tablet (  80 mg total) by mouth 4 (four) times daily as needed for flatulence.   torsemide (DEMADEX) 20 MG tablet TAKE 2 TABLETS(40 MG) BY MOUTH DAILY   vitamin B-12 (CYANOCOBALAMIN) 100 MCG tablet Take 100 mcg by mouth daily. (Patient not taking: Reported on 02/23/2024)   Vitamin D, Cholecalciferol, 50 MCG (2000 UT) CAPS Take 2,000 Units by mouth daily. (Patient not taking: Reported on 02/23/2024)   No facility-administered encounter medications on file as of 02/23/2024.    Allergies (verified) Atorvastatin, Gabapentin, Pravastatin, Pregabalin, and  Trulicity [dulaglutide]   History: Past Medical History:  Diagnosis Date   (HFpEF) heart failure with preserved ejection fraction (HCC)    a.) TTE 05/29/2017: EF 55-60%, mild LAE, G1DD; b.) TTE 09/07/2017: EF >55%, mild LAE, Ao sclerosis without stenosis, degen MV disease, G2DD; c.) TTE 12/02/2018: EF 55-60%, mild LVH, Mild BAE, triv TR, G1DD; d.) TTE 11/22/2019: EF 60-65%, mod LVH, mild TR, G1DD   Anemia in stage 4 chronic kidney disease (HCC)    Arthritis    Cardiomyopathy (HCC)    CKD (chronic kidney disease) stage 4, GFR 15-29 ml/min (HCC)    CVA (cerebral vascular accident) (HCC) 08/2017   a.) no residual deficits   Diabetic neuropathy (HCC)    Diverticulitis    GERD (gastroesophageal reflux disease)    History of hiatal hernia    Hyperlipidemia    Hypertension    Long term current use of antithrombotics/antiplatelets    a.) DAPT therapy (ASA + clopidogrel)   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)    Paraesophageal hernia    PHN (postherpetic neuralgia)    Sarcoidosis    Shingles    Type 2 diabetes mellitus treated with insulin (HCC)    Wears dentures    partial upper   Past Surgical History:  Procedure Laterality Date   CATARACT EXTRACTION W/PHACO Left 02/05/2021   Procedure: CATARACT EXTRACTION PHACO AND INTRAOCULAR LENS PLACEMENT (IOC) LEFT DIABETIC 5.47 00:51.4;  Surgeon: Galen Manila, MD;  Location: [facility];  Service: Ophthalmology;  Laterality: Left;  Diabetic - insulin and oral meds   CATARACT EXTRACTION W/PHACO Right 03/19/2021   Procedure: CATARACT EXTRACTION PHACO AND INTRAOCULAR LENS PLACEMENT (IOC) RIGHT DIABETIC 6.09 00:39.6;  Surgeon: Galen Manila, MD;  Location: [facility];  Service: Ophthalmology;  Laterality: Right;   COLONOSCOPY  12/15/2006   COLONOSCOPY WITH PROPOFOL N/A 06/06/2019   Procedure: COLONOSCOPY WITH PROPOFOL;  Surgeon: Wyline Mood, MD;  Location: [facility];  Service:  Gastroenterology;  Laterality: N/A;   COLONOSCOPY WITH PROPOFOL N/A 01/16/2020   Procedure: COLONOSCOPY WITH BIOPSY;  Surgeon: Wyline Mood, MD;  Location: [facility];  Service: Endoscopy;  Laterality: N/A;  Diabetic - insulin and oral meds   ESOPHAGOGASTRODUODENOSCOPY (EGD) WITH PROPOFOL N/A 06/23/2022   Procedure: ESOPHAGOGASTRODUODENOSCOPY (EGD) WITH PROPOFOL;  Surgeon: Wyline Mood, MD;  Location: [facility]COPY;  Service: Gastroenterology;  Laterality: N/A;   EYE SURGERY     INSERTION OF MESH  08/26/2022   Procedure: INSERTION OF MESH;  Surgeon: Leafy Ro, MD;  Location: [facility];  Service: General;;   IR RADIOLOGIST EVAL & MGMT  08/18/2023   IR RADIOLOGIST EVAL & MGMT  09/09/2023   POLYPECTOMY N/A 01/16/2020   Procedure: POLYPECTOMY;  Surgeon: Wyline Mood, MD;  Location: [facility];  Service: Endoscopy;  Laterality: N/A;   TUBAL LIGATION     XI ROBOTIC ASSISTED PARAESOPHAGEAL HERNIA REPAIR N/A 08/26/2022   Procedure: XI ROBOTIC ASSISTED PARAESOPHAGEAL HERNIA REPAIR, RNFA to  assist;  Surgeon: Leafy Ro, MD;  Location: [facility];  Service: General;  Laterality: N/A;   Family History  Problem Relation Age of Onset   Diabetes Mother    Stroke Mother    Hyperlipidemia Father    Hypertension Father    Diabetes Sister    Diabetes Brother    Gout Son    Diabetes Brother    Kidney disease Son    Lymphoma Sister    Heart attack Sister    Breast cancer Neg Hx    Social History   Socioeconomic History   Marital status: Married    Spouse name: Joe   Number of children: 4   Years of education: Not on file   Highest education level: GED or equivalent  Occupational History   Occupation: retired  Tobacco Use   Smoking status: Never    Passive exposure: Never   Smokeless tobacco: Never  Vaping Use   Vaping status: Never Used  Substance and Sexual Activity   Alcohol use: No    Alcohol/week: 0.0 standard drinks of alcohol   Drug use: No   Sexual activity: Not  Currently  Other Topics Concern   Not on file  Social History Narrative   Grandson lives with them.   Social Drivers of Corporate investment banker Strain: Low Risk  (02/23/2024)   Overall Financial Resource Strain (CARDIA)    Difficulty of Paying Living Expenses: Not very hard  Food Insecurity: No Food Insecurity (02/23/2024)   Hunger Vital Sign    Worried About Running Out of Food in the Last Year: Never true    Ran Out of Food in the Last Year: Never true  Transportation Needs: No Transportation Needs (02/23/2024)   PRAPARE - Administrator, Civil Service (Medical): No    Lack of Transportation (Non-Medical): No  Physical Activity: Inactive (02/23/2024)   Exercise Vital Sign    Days of Exercise per Week: 0 days    Minutes of Exercise per Session: 0 min  Stress: No Stress Concern Present (02/23/2024)   [employer] of Occupational Health - Occupational Stress Questionnaire    Feeling of Stress : Not at all  Social Connections: Socially Integrated (02/23/2024)   Social Connection and Isolation Panel [NHANES]    Frequency of Communication with Friends and Family: More than three times a week    Frequency of Social Gatherings with Friends and Family: Three times a week    Attends Religious Services: More than 4 times per year    Active Member of Clubs or Organizations: Yes    Attends Engineer, structural: More than 4 times per year    Marital Status: Married    Tobacco Counseling Counseling given: Not Answered    Clinical Intake:  Pre-visit preparation completed: Yes  Pain : No/denies pain Pain Score: 1      BMI - recorded: 18.64 Nutritional Status: BMI <19  Underweight Nutritional Risks: Unintentional weight loss, Nausea/ vomitting/ diarrhea (upset stomach intermittently) Diabetes: Yes CBG done?: No Did pt. bring in CBG monitor from home?: No  How often do you need to have someone help you when you read instructions, pamphlets, or other  written materials from your doctor or pharmacy?: 3 - Sometimes (daughter helps her with medications)  Interpreter Needed?: No  Information entered by :: Jillyn Stacey, LPN   Activities of Daily Living     02/23/2024    1:20 PM 08/01/2023    2:55 AM  In  your present state of health, do you have any difficulty performing the following activities:  Hearing? 0 0  Vision? 1 0  Comment needs glasses changed - not seeing as well as she used to   Difficulty concentrating or making decisions? 0 0  Walking or climbing stairs? 1 0  Comment She can if she has a rail to pull up on   Dressing or bathing? 0 0  Doing errands, shopping? 0 0  Preparing Food and eating ? N   Using the Toilet? N   In the past six months, have you accidently leaked urine? Y   Comment urge incontinence   Do you have problems with loss of bowel control? N   Managing your Medications? Y   Comment daughter helps   Managing your Finances? N   Comment husband handles most, but she pays house payment   Housekeeping or managing your Housekeeping? N     Patient Care Team: Marjie Skiff, NP as PCP - General (Nurse Practitioner) Antonieta Iba, MD as PCP - Cardiology (Cardiology) Solum, Marlana Salvage, MD as Physician Assistant (Endocrinology) Lamont Dowdy, MD as Consulting Physician (Nephrology) Antonieta Iba, MD as Consulting Physician (Cardiology) Pa, [facility] (Optometry) Rickard Patience, MD as Consulting Physician (Oncology) Rodney Langton, RN as Case Manager (General Practice) Rickard Patience, MD as Consulting Physician (Oncology)  Indicate any recent Medical Services you may have received from other than [facility] providers in the past year (date may be approximate).     Assessment:   This is a routine wellness examination for [place].  Hearing/Vision screen Hearing Screening - Comments:: Denies hearing difficulties    Vision Screening - Comments:: Hasn't seen eye dr in about 2 years - Goes to [employer] -  needs glasses changed   Goals Addressed             This Visit's Progress    Patient Stated       Wants to Get Better - gain weight back, exercise       Depression Screen     02/23/2024    1:35 PM 01/08/2024    2:28 PM 07/06/2023    8:38 AM 06/15/2023    3:35 PM 02/17/2023    2:58 PM 12/22/2022    3:13 PM 12/01/2022    8:09 AM  PHQ 2/9 Scores  PHQ - 2 Score 0 0 0 0 0 1 0  PHQ- 9 Score 6 6 6  0 0 9     Fall Risk     02/23/2024    1:40 PM 01/08/2024    2:26 PM 07/06/2023    8:38 AM 04/06/2023    2:12 PM 02/17/2023    3:02 PM  Fall Risk   Falls in the past year? 1 1 0 1 0  Number falls in past yr: 0 0 0 0 0  Injury with Fall? 0 0 0 0 0  Risk for fall due to : Impaired balance/gait;Orthopedic patient;Medication side effect;History of fall(s) Impaired balance/gait;History of fall(s) No Fall Risks History of fall(s);Impaired balance/gait No Fall Risks  Risk for fall due to: Comment    dizziness at times   Follow up Falls evaluation completed;Education provided Falls evaluation completed Falls evaluation completed Falls evaluation completed;Education provided;Falls prevention discussed Falls prevention discussed;Falls evaluation completed    MEDICARE RISK AT HOME:  Medicare Risk at Home Any stairs in or around the home?: No If so, are there any without handrails?: No Home free of loose throw rugs  in walkways, pet beds, electrical cords, etc?: Yes Adequate lighting in your home to reduce risk of falls?: Yes Life alert?: Yes (doesn't usually wear it) Use of a cane, walker or w/c?: Yes Grab bars in the bathroom?: Yes Shower chair or bench in shower?: Yes Elevated toilet seat or a handicapped toilet?: Yes  TIMED UP AND GO:  Was the test performed?  No  Cognitive Function: 6CIT completed        02/23/2024    1:42 PM 02/17/2023    3:10 PM 07/30/2020    2:38 PM 07/06/2019   10:26 AM 06/30/2018   10:43 AM  6CIT Screen  What Year? 0 points 0 points 0 points 0 points 0 points  What  month? 0 points 0 points 0 points 0 points 0 points  What time? 0 points 0 points 0 points 0 points 0 points  Count back from 20 0 points 0 points 0 points 0 points 0 points  Months in reverse 0 points 2 points 4 points 0 points 0 points  Repeat phrase 2 points 0 points 0 points 0 points 2 points  Total Score 2 points 2 points 4 points 0 points 2 points    Immunizations Immunization History  Administered Date(s) Administered   Fluad Quad(high Dose 65+) 09/30/2019, 01/29/2021, 10/29/2021, 12/22/2022   Influenza, High Dose Seasonal PF 09/10/2016, 11/16/2018   Influenza,inj,Quad PF,6+ Mos 09/18/2015   Influenza-Unspecified 09/18/2015, 09/08/2017   Moderna Sars-Covid-2 Vaccination 02/15/2020, 03/07/2020, 09/14/2020   PPD Test 07/15/2016, 07/29/2016   Pneumococcal Conjugate-13 06/07/2014   Pneumococcal Polysaccharide-23 09/07/2013   Td 06/12/2005   Td (Adult), 2 Lf Tetanus Toxid, Preservative Free 06/12/2005   Tdap 10/10/2016   Zoster, Live 09/13/2013    Screening Tests Health Maintenance  Topic Date Due   DEXA SCAN  03/11/2022   MAMMOGRAM  01/07/2023   OPHTHALMOLOGY EXAM  06/07/2023   COVID-19 Vaccine (4 - 2024-25 season) 08/16/2023   Zoster Vaccines- Shingrix (1 of 2) 03/11/2024 (Originally 02/09/1965)   INFLUENZA VACCINE  03/14/2024 (Originally 07/16/2023)   HEMOGLOBIN A1C  04/25/2024   Diabetic kidney evaluation - eGFR measurement  08/04/2024   Diabetic kidney evaluation - Urine ACR  01/07/2025   FOOT EXAM  01/07/2025   Medicare Annual Wellness (AWV)  02/22/2025   DTaP/Tdap/Td (3 - Td or Tdap) 10/10/2026   Pneumonia Vaccine 21+ Years old  Completed   Hepatitis C Screening  Completed   HPV VACCINES  Aged Out   Colonoscopy  Discontinued    Health Maintenance  Health Maintenance Due  Topic Date Due   DEXA SCAN  03/11/2022   MAMMOGRAM  01/07/2023   OPHTHALMOLOGY EXAM  06/07/2023   COVID-19 Vaccine (4 - 2024-25 season) 08/16/2023   Health Maintenance Items  Addressed: Mammogram ordered, DEXA ordered, Patient will call [facility] for appt and stop by pharmacy to discuss SHX, RSV, FLU and Covid  Additional Screening:  Vision Screening: Recommended annual ophthalmology exams for early detection of glaucoma and other disorders of the eye.  Dental Screening: Recommended annual dental exams for proper oral hygiene  Community Resource Referral / Chronic Care Management: CRR required this visit?  No   CCM required this visit?  No     Plan:     I have personally reviewed and noted the following in the patient's chart:   Medical and social history Use of alcohol, tobacco or illicit drugs  Current medications and supplements including opioid prescriptions. Patient is not currently taking opioid prescriptions. Functional ability and  status Nutritional status Physical activity Advanced directives List of other physicians Hospitalizations, surgeries, and ER visits in previous 12 months Vitals Screenings to include cognitive, depression, and falls Referrals and appointments  In addition, I have reviewed and discussed with patient certain preventive protocols, quality metrics, and best practice recommendations. A written personalized care plan for preventive services as well as general preventive health recommendations were provided to patient.     Roise Emert E Gavriela Cashin, LPN   1/32/4401   After Visit Summary: (MyChart) Due to this being a telephonic visit, the after visit summary with patients personalized plan was offered to patient via MyChart   Notes:  Unintentional weight loss since surgery - referred to Diabetic Nutritionist

## 2024-02-23 NOTE — Telephone Encounter (Signed)
 Copied from CRM [id number]. Topic: Medicare AWV >> Feb 23, 2024  9:05 AM Payton Doughty wrote: Reason for CRM: LVM 02/23/24 to change AWV to telephone visit/virtual visit today [facility].Marland KitchenPlease call [phone] to r/s appointment today.  Verlee Rossetti; Care Guide Ambulatory Clinical Support Flowella l [facility] Direct Dial: [phone]

## 2024-02-24 ENCOUNTER — Inpatient Hospital Stay: Payer: Medicare Other

## 2024-02-24 ENCOUNTER — Encounter: Payer: Self-pay | Admitting: Oncology

## 2024-02-24 ENCOUNTER — Inpatient Hospital Stay: Payer: Medicare Other | Attending: Oncology | Admitting: Oncology

## 2024-02-24 ENCOUNTER — Inpatient Hospital Stay: Payer: Self-pay

## 2024-02-24 ENCOUNTER — Other Ambulatory Visit

## 2024-02-24 VITALS — BP 123/76 | HR 77 | Temp 96.0°F | Resp 18 | Wt 121.8 lb

## 2024-02-24 DIAGNOSIS — D631 Anemia in chronic kidney disease: Secondary | ICD-10-CM | POA: Diagnosis not present

## 2024-02-24 DIAGNOSIS — D638 Anemia in other chronic diseases classified elsewhere: Secondary | ICD-10-CM | POA: Diagnosis not present

## 2024-02-24 DIAGNOSIS — Z79899 Other long term (current) drug therapy: Secondary | ICD-10-CM | POA: Insufficient documentation

## 2024-02-24 DIAGNOSIS — N184 Chronic kidney disease, stage 4 (severe): Secondary | ICD-10-CM | POA: Diagnosis not present

## 2024-02-24 LAB — CBC WITH DIFFERENTIAL (CANCER CENTER ONLY)
Abs Immature Granulocytes: 0.1 10*3/uL — ABNORMAL HIGH (ref 0.00–0.07)
Basophils Absolute: 0 10*3/uL (ref 0.0–0.1)
Basophils Relative: 1 %
Eosinophils Absolute: 0.1 10*3/uL (ref 0.0–0.5)
Eosinophils Relative: 2 %
HCT: 30.6 % — ABNORMAL LOW (ref 36.0–46.0)
Hemoglobin: 9.6 g/dL — ABNORMAL LOW (ref 12.0–15.0)
Immature Granulocytes: 1 %
Lymphocytes Relative: 20 %
Lymphs Abs: 1.5 10*3/uL (ref 0.7–4.0)
MCH: 27.6 pg (ref 26.0–34.0)
MCHC: 31.4 g/dL (ref 30.0–36.0)
MCV: 87.9 fL (ref 80.0–100.0)
Monocytes Absolute: 0.5 10*3/uL (ref 0.1–1.0)
Monocytes Relative: 7 %
Neutro Abs: 5.2 10*3/uL (ref 1.7–7.7)
Neutrophils Relative %: 69 %
Platelet Count: 291 10*3/uL (ref 150–400)
RBC: 3.48 MIL/uL — ABNORMAL LOW (ref 3.87–5.11)
RDW: 14.3 % (ref 11.5–15.5)
WBC Count: 7.4 10*3/uL (ref 4.0–10.5)
nRBC: 0 % (ref 0.0–0.2)

## 2024-02-24 LAB — RETIC PANEL
Immature Retic Fract: 9.2 % (ref 2.3–15.9)
RBC.: 3.49 MIL/uL — ABNORMAL LOW (ref 3.87–5.11)
Retic Count, Absolute: 56.2 10*3/uL (ref 19.0–186.0)
Retic Ct Pct: 1.6 % (ref 0.4–3.1)
Reticulocyte Hemoglobin: 30.6 pg (ref >27.9)

## 2024-02-24 LAB — IRON AND TIBC
Iron: 56 ug/dL (ref 28–170)
Saturation Ratios: 19 % (ref 10.4–31.8)
TIBC: 298 ug/dL (ref 250–450)
UIBC: 242 ug/dL

## 2024-02-24 LAB — FERRITIN: Ferritin: 296 ng/mL (ref 11–307)

## 2024-02-24 MED ORDER — EPOETIN ALFA-EPBX 10000 UNIT/ML IJ SOLN
10000.0000 [IU] | Freq: Once | INTRAMUSCULAR | Status: AC
  Administered 2024-02-24: 10000 [IU] via SUBCUTANEOUS
  Filled 2024-02-24: qty 1

## 2024-02-24 NOTE — Progress Notes (Signed)
 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 Labs reviewed and discussed patient. Lab Results  Component Value Date   HGB 9.6 (L) 02/24/2024   TIBC 298 02/24/2024   IRONPCTSAT 19 02/24/2024   FERRITIN 296 02/24/2024     Retacrit 10,000 units x 1 today. Ferritin is >200, no need for Venofer     Orders Placed This Encounter  Procedures   CBC with Differential (Cancer Center Only)    Standing Status:   Future    Expected Date:   06/29/2024    Expiration Date:   02/23/2025   Retic Panel    Standing Status:   Future    Expected Date:   06/29/2024    Expiration Date:   02/23/2025   Ferritin    Standing Status:   Future    Expected Date:   06/29/2024    Expiration Date:   02/23/2025   Iron and TIBC    Standing Status:   Future    Expected Date:   06/29/2024    Expiration Date:   02/23/2025   Hemoglobin and Hematocrit (Cancer Center Only)    Standing Status:   Standing    Number of Occurrences:   2    Expiration Date:   02/23/2025   Follow-up  H&H labs in 6w,12+/- retacrit 18 weeks lab MD +/- retacrit    All questions were answered. The patient knows to call the clinic with any problems, questions or concerns.  Rickard Patience, MD, PhD [facility] Hematology Oncology 02/24/2024     HISTORY OF PRESENTING ILLNESS:  Tonya [name] is a  78 y.o.  female with PMH listed below who was referred to me for evaluation of anemia Reviewed patient's recent labs that was done at [facility] office. Labs revealed anemia with hemoglobin of 8.9, MCV 82, normal platelet and wbc  Reviewed patient's previous labs ordered by primary care physician's office, anemia is chronic onset , duration is since 2017 No aggravating or improving  factors.  Associated signs and symptoms: Patient reports fatigue.  SOB with exertion.  Denies weight loss, easy bruising, hematochezia, hemoptysis, hematuria. Context: History of GI bleeding: denies               History of Chronic kidney disease: CKD, follows up with Dr.Kolluru.                History of autoimmune disease: denies               History of hemolytic anemia. denies               Last colonoscopy: underwent colonoscopy on 06/06/2019.  Due to poor preparation, patient was scheduled to repeat colonoscopy due to suboptimal bowel preparation.  Patient told me that she is waiting for insurance approval for repeat procedure.  Previously tolerated IV Venofer treatments very well.  acute drop of hemoglobin to 5.9, status post PRBC transfusion at [facility].  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  She was discharged with PPI for 6 weeks. Hemoglobin at discharge on 12/10/2019 was 8.6.  INTERVAL HISTORY Tonya [name] is a 78 y.o. female who has above history reviewed by me today  presents to reestablish care for chronic anemia. She reports feeling well today.  No new complaints.  Review of Systems  Constitutional:  Positive for fatigue. Negative for appetite change, chills and fever.  HENT:   Negative for hearing loss and voice change.   Eyes:  Negative for eye problems.  Respiratory:  Negative for chest tightness and cough.   Cardiovascular:  Negative for chest pain.  Gastrointestinal:  Negative for abdominal distention, abdominal pain and blood in stool.  Endocrine: Negative for hot flashes.  Genitourinary:  Negative for difficulty urinating and frequency.   Musculoskeletal:  Negative for arthralgias.  Skin:  Negative for itching and rash.  Neurological:  Negative for extremity weakness.  Hematological:  Negative for adenopathy.  Psychiatric/Behavioral:  Negative  for confusion.     MEDICAL HISTORY:  Past Medical History:  Diagnosis Date   (HFpEF) heart failure with preserved ejection fraction (HCC)    a.) TTE 05/29/2017: EF 55-60%, mild LAE, G1DD; b.) TTE 09/07/2017: EF >55%, mild LAE, Ao sclerosis without stenosis, degen MV disease, G2DD; c.) TTE 12/02/2018: EF 55-60%, mild LVH, Mild BAE, triv TR, G1DD; d.) TTE 11/22/2019: EF 60-65%, mod LVH, mild TR, G1DD   Anemia in stage 4 chronic kidney disease (HCC)    Arthritis    Cardiomyopathy (HCC)    CKD (chronic kidney disease) stage 4, GFR 15-29 ml/min (HCC)    CVA (cerebral vascular accident) (HCC) 08/2017   a.) no residual deficits   Diabetic neuropathy (HCC)    Diverticulitis    GERD (gastroesophageal reflux disease)    History of hiatal hernia    Hyperlipidemia    Hypertension    Long term current use of antithrombotics/antiplatelets    a.) DAPT therapy (ASA + clopidogrel)   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)    Paraesophageal hernia    PHN (postherpetic neuralgia)    Sarcoidosis    Shingles    Type 2 diabetes mellitus treated with insulin (HCC)    Wears dentures    partial upper    SURGICAL HISTORY: Past Surgical History:  Procedure Laterality Date   CATARACT EXTRACTION W/PHACO Left 02/05/2021   Procedure: CATARACT EXTRACTION PHACO AND INTRAOCULAR LENS PLACEMENT (IOC) LEFT DIABETIC 5.47 00:51.4;  Surgeon: Galen Manila, MD;  Location: [facility];  Service: Ophthalmology;  Laterality: Left;  Diabetic - insulin and oral meds   CATARACT EXTRACTION W/PHACO Right 03/19/2021   Procedure: CATARACT EXTRACTION PHACO AND INTRAOCULAR LENS PLACEMENT (IOC) RIGHT DIABETIC 6.09 00:39.6;  Surgeon: Galen Manila, MD;  Location: [facility];  Service: Ophthalmology;  Laterality: Right;   COLONOSCOPY  12/15/2006   COLONOSCOPY WITH PROPOFOL N/A 06/06/2019   Procedure: COLONOSCOPY WITH PROPOFOL;  Surgeon: Wyline Mood, MD;  Location: [facility]  ENDOSCOPY;  Service: Gastroenterology;  Laterality: N/A;   COLONOSCOPY WITH PROPOFOL N/A 01/16/2020   Procedure: COLONOSCOPY WITH BIOPSY;  Surgeon: Wyline Mood, MD;  Location: [facility];  Service: Endoscopy;  Laterality: N/A;  Diabetic - insulin and oral meds   ESOPHAGOGASTRODUODENOSCOPY (EGD) WITH PROPOFOL N/A 06/23/2022   Procedure: ESOPHAGOGASTRODUODENOSCOPY (EGD) WITH PROPOFOL;  Surgeon: Wyline Mood, MD;  Location: [facility] ENDOSCOPY;  Service: Gastroenterology;  Laterality: N/A;   EYE SURGERY     INSERTION OF MESH  08/26/2022   Procedure: INSERTION OF MESH;  Surgeon: Leafy Ro, MD;  Location: [facility];  Service: General;;   IR RADIOLOGIST EVAL & MGMT  08/18/2023   IR RADIOLOGIST EVAL & MGMT  09/09/2023   POLYPECTOMY N/A 01/16/2020  Procedure: POLYPECTOMY;  Surgeon: Wyline Mood, MD;  Location: [facility];  Service: Endoscopy;  Laterality: N/A;   TUBAL LIGATION     XI ROBOTIC ASSISTED PARAESOPHAGEAL HERNIA REPAIR N/A 08/26/2022   Procedure: XI ROBOTIC ASSISTED PARAESOPHAGEAL HERNIA REPAIR, RNFA to assist;  Surgeon: Leafy Ro, MD;  Location: [facility];  Service: General;  Laterality: N/A;    SOCIAL HISTORY: Social History   Socioeconomic History   Marital status: Married    Spouse name: Joe   Number of children: 4   Years of education: Not on file   Highest education level: GED or equivalent  Occupational History   Occupation: retired  Tobacco Use   Smoking status: Never    Passive exposure: Never   Smokeless tobacco: Never  Vaping Use   Vaping status: Never Used  Substance and Sexual Activity   Alcohol use: No    Alcohol/week: 0.0 standard drinks of alcohol   Drug use: No   Sexual activity: Not Currently  Other Topics Concern   Not on file  Social History Narrative   Grandson lives with them.   Social Drivers of Corporate investment banker Strain: Low Risk  (02/23/2024)   Overall Financial Resource Strain (CARDIA)    Difficulty of Paying Living  Expenses: Not very hard  Food Insecurity: No Food Insecurity (02/23/2024)   Hunger Vital Sign    Worried About Running Out of Food in the Last Year: Never true    Ran Out of Food in the Last Year: Never true  Transportation Needs: No Transportation Needs (02/23/2024)   PRAPARE - Administrator, Civil Service (Medical): No    Lack of Transportation (Non-Medical): No  Physical Activity: Inactive (02/23/2024)   Exercise Vital Sign    Days of Exercise per Week: 0 days    Minutes of Exercise per Session: 0 min  Stress: No Stress Concern Present (02/23/2024)   [employer] of Occupational Health - Occupational Stress Questionnaire    Feeling of Stress : Not at all  Social Connections: Socially Integrated (02/23/2024)   Social Connection and Isolation Panel [NHANES]    Frequency of Communication with Friends and Family: More than three times a week    Frequency of Social Gatherings with Friends and Family: Three times a week    Attends Religious Services: More than 4 times per year    Active Member of Clubs or Organizations: Yes    Attends Banker Meetings: More than 4 times per year    Marital Status: Married  Catering manager Violence: Not At Risk (02/23/2024)   Humiliation, Afraid, Rape, and Kick questionnaire    Fear of Current or Ex-Partner: No    Emotionally Abused: No    Physically Abused: No    Sexually Abused: No    FAMILY HISTORY: Family History  Problem Relation Age of Onset   Diabetes Mother    Stroke Mother    Hyperlipidemia Father    Hypertension Father    Diabetes Sister    Diabetes Brother    Gout Son    Diabetes Brother    Kidney disease Son    Lymphoma Sister    Heart attack Sister    Breast cancer Neg Hx     ALLERGIES:  is allergic to atorvastatin, gabapentin, pravastatin, pregabalin, and trulicity [dulaglutide].  MEDICATIONS:  Current Outpatient Medications  Medication Sig Dispense Refill   acetaminophen (TYLENOL) 650 MG CR  tablet Take 650 mg by mouth every 8 (eight)  hours as needed for pain.     aspirin 81 MG tablet Take 81 mg by mouth daily.     carvedilol (COREG) 3.125 MG tablet Take 1 tablet (3.125 mg total) by mouth 2 (two) times daily with a meal. PLEASE CALL [phone] TO SCHEDULE YEARLY APPOINTMENT FOR FURTHER REFILLS.  (THIRD ATTEMPT) 30 tablet 0   Continuous Glucose Sensor (FREESTYLE LIBRE 14 DAY SENSOR) MISC by Does not apply route.     dapagliflozin propanediol (FARXIGA) 10 MG TABS tablet Take 10 mg by mouth daily.     diclofenac Sodium (VOLTAREN) 1 % GEL Apply 4 g topically 4 (four) times daily. 4 g 1   docusate sodium (COLACE) 100 MG capsule Take 100 mg by mouth daily as needed for mild constipation.     insulin lispro (HUMALOG) 100 UNIT/ML injection Inject 8 Units into the skin 3 (three) times daily before meals.     meclizine (ANTIVERT) 12.5 MG tablet Take 12.5 mg by mouth 3 (three) times daily.     NOVOFINE 32G X 6 MM MISC USE AS DIRECTED THREE TIMES DAILY WITH HUMALOG 100 each 3   olmesartan (BENICAR) 40 MG tablet Take 1 tablet (40 mg total) by mouth daily. 90 tablet 4   pantoprazole (PROTONIX) 40 MG tablet Take 1 tablet (40 mg total) by mouth daily. 90 tablet 4   pioglitazone (ACTOS) 30 MG tablet Take 30 mg by mouth daily.     polyethylene glycol (MIRALAX / GLYCOLAX) 17 g packet Take 17 g by mouth 2 (two) times daily. 14 each 0   rosuvastatin (CRESTOR) 10 MG tablet Take 1 tablet (10 mg total) by mouth daily. 90 tablet 4   simethicone (MYLICON) 80 MG chewable tablet Chew 1 tablet (80 mg total) by mouth 4 (four) times daily as needed for flatulence. 30 tablet 0   torsemide (DEMADEX) 20 MG tablet TAKE 2 TABLETS(40 MG) BY MOUTH DAILY 60 tablet 0   vitamin B-12 (CYANOCOBALAMIN) 100 MCG tablet Take 100 mcg by mouth daily. (Patient not taking: Reported on 02/24/2024)     Vitamin D, Cholecalciferol, 50 MCG (2000 UT) CAPS Take 2,000 Units by mouth daily. (Patient not taking: Reported on 02/24/2024)     No  current facility-administered medications for this visit.     PHYSICAL EXAMINATION: ECOG PERFORMANCE STATUS: 1 - Symptomatic but completely ambulatory Vitals:   02/24/24 1308  BP: 123/76  Pulse: 77  Resp: 18  Temp: (!) 96 F (35.6 C)  SpO2: 100%   Filed Weights   02/24/24 1308  Weight: 121 lb 12.8 oz (55.2 kg)    Physical Exam Constitutional:      General: She is not in acute distress. HENT:     Head: Normocephalic and atraumatic.  Eyes:     General: No scleral icterus.    Pupils: Pupils are equal, round, and reactive to light.  Cardiovascular:     Rate and Rhythm: Normal rate.  Pulmonary:     Effort: Pulmonary effort is normal. No respiratory distress.     Breath sounds: No wheezing.  Abdominal:     General: Bowel sounds are normal. There is no distension.     Palpations: Abdomen is soft.  Musculoskeletal:        General: No deformity. Normal range of motion.     Cervical back: Normal range of motion and neck supple.  Skin:    General: Skin is warm and dry.     Findings: No erythema or rash.  Neurological:  Mental Status: She is alert and oriented to person, place, and time.     Cranial Nerves: No cranial nerve deficit.     Coordination: Coordination normal.  Psychiatric:        Mood and Affect: Mood normal.      LABORATORY DATA:  I have reviewed the data as listed    Latest Ref Rng & Units 02/24/2024   12:53 PM 01/13/2024    1:54 PM 01/08/2024    3:07 PM  CBC  WBC 4.0 - 10.5 K/uL 7.4   6.2   Hemoglobin 12.0 - 15.0 g/dL 9.6  16.1  09.6   Hematocrit 36.0 - 46.0 % 30.6  33.2  33.6   Platelets 150 - 400 K/uL 291   280       Latest Ref Rng & Units 08/05/2023    4:46 AM 08/04/2023    5:45 AM 08/03/2023    4:54 AM  CMP  Glucose 70 - 99 mg/dL 045  409  811   BUN 8 - 23 mg/dL 34  43  50   Creatinine 0.44 - 1.00 mg/dL 9.14  7.82  9.56   Sodium 135 - 145 mmol/L 140  140  140   Potassium 3.5 - 5.1 mmol/L 4.4  4.5  4.1   Chloride 98 - 111 mmol/L 114  110   110   CO2 22 - 32 mmol/L 21  21  24    Calcium 8.9 - 10.3 mg/dL 8.0  8.1  7.9    Lab Results  Component Value Date   IRON 56 02/24/2024   TIBC 298 02/24/2024   FERRITIN 296 02/24/2024

## 2024-02-24 NOTE — Assessment & Plan Note (Addendum)
 History of iron deficiency anemia,  anemia secondary to chronic kidney disease. Labs reviewed and discussed patient. Lab Results  Component Value Date   HGB 9.6 (L) 02/24/2024   TIBC 298 02/24/2024   IRONPCTSAT 19 02/24/2024   FERRITIN 296 02/24/2024     Retacrit 10,000 units x 1 today. Ferritin is >200, no need for Venofer

## 2024-02-25 DIAGNOSIS — I129 Hypertensive chronic kidney disease with stage 1 through stage 4 chronic kidney disease, or unspecified chronic kidney disease: Secondary | ICD-10-CM | POA: Diagnosis not present

## 2024-02-25 DIAGNOSIS — N2581 Secondary hyperparathyroidism of renal origin: Secondary | ICD-10-CM | POA: Diagnosis not present

## 2024-02-25 DIAGNOSIS — E1122 Type 2 diabetes mellitus with diabetic chronic kidney disease: Secondary | ICD-10-CM | POA: Diagnosis not present

## 2024-02-25 DIAGNOSIS — E875 Hyperkalemia: Secondary | ICD-10-CM | POA: Diagnosis not present

## 2024-02-25 DIAGNOSIS — R809 Proteinuria, unspecified: Secondary | ICD-10-CM | POA: Diagnosis not present

## 2024-02-25 DIAGNOSIS — D631 Anemia in chronic kidney disease: Secondary | ICD-10-CM | POA: Diagnosis not present

## 2024-02-25 DIAGNOSIS — N184 Chronic kidney disease, stage 4 (severe): Secondary | ICD-10-CM | POA: Diagnosis not present

## 2024-03-07 ENCOUNTER — Other Ambulatory Visit: Payer: Self-pay

## 2024-03-07 NOTE — Progress Notes (Unsigned)
   03/07/2024  Patient ID: Tonya [name], female   DOB: [birth year], 78 y.o.   MRN: [id number]  Outreach attempt to follow-up with patient regarding Marcelline Deist and control of diabetes.  I was not able to reach the patient but HIPAA compliant voicemail with my direct phone number was left.  If will attempt to reach again next week if I do not hear back.  Lenna Gilford, PharmD, DPLA

## 2024-03-14 ENCOUNTER — Other Ambulatory Visit: Payer: Self-pay

## 2024-03-14 NOTE — Progress Notes (Unsigned)
   03/14/2024  Patient ID: Tonya [name], female   DOB: 1946-11-12, 78 y.o.   MRN: [id number]  Outreach attempt to check in with patient regarding DM control and [place].  I was not able to reach patient but HIPAA compliant voicemail was left with my direct phone number.  If I do not hear back, I will try to contact the patient again next week.  Lenna Gilford, PharmD, DPLA

## 2024-03-15 ENCOUNTER — Encounter: Payer: Self-pay | Admitting: Oncology

## 2024-03-21 ENCOUNTER — Other Ambulatory Visit: Payer: Self-pay

## 2024-03-21 NOTE — Progress Notes (Unsigned)
   03/21/2024  Patient ID: Tonya [name], female   DOB: Mar 03, 1946, 78 y.o.   MRN: [id number]  Out reach attempt to make sure patient has been able to resume [place] therapy.  I was not able to reach her but did leave HIPAA compliant voicemail with my direct phone number.  I will try to call again in 1-2 weeks if I do not hear back.  Lenna Gilford, PharmD, DPLA

## 2024-03-22 ENCOUNTER — Other Ambulatory Visit: Payer: Self-pay

## 2024-03-22 NOTE — Progress Notes (Signed)
   03/22/2024  Patient ID: Tonya [name], female   DOB: Feb 04, 1946, 78 y.o.   MRN: [id number]  Patient outreach to follow-up on Farxiga 10mg  daily.  Patient had stopped taking medication, but nephrology advised patient resume therapy; and they worked with patient to apply for AZ&Me PAP.  Per patient, medication has been received from program; and she has resumed therapy.  Patient is followed by Endocrinology for management of DM, and last A1c was elevated at 8.8% in November.  Will review chart for updated value in 2-3 months, as this will reflect effect of Marcelline Deist being back on board.  Lenna Gilford, PharmD, DPLA

## 2024-03-23 DIAGNOSIS — N184 Chronic kidney disease, stage 4 (severe): Secondary | ICD-10-CM | POA: Diagnosis not present

## 2024-03-23 DIAGNOSIS — N2581 Secondary hyperparathyroidism of renal origin: Secondary | ICD-10-CM | POA: Diagnosis not present

## 2024-03-23 DIAGNOSIS — I129 Hypertensive chronic kidney disease with stage 1 through stage 4 chronic kidney disease, or unspecified chronic kidney disease: Secondary | ICD-10-CM | POA: Diagnosis not present

## 2024-03-23 DIAGNOSIS — E1122 Type 2 diabetes mellitus with diabetic chronic kidney disease: Secondary | ICD-10-CM | POA: Diagnosis not present

## 2024-03-23 DIAGNOSIS — D631 Anemia in chronic kidney disease: Secondary | ICD-10-CM | POA: Diagnosis not present

## 2024-03-23 DIAGNOSIS — E875 Hyperkalemia: Secondary | ICD-10-CM | POA: Diagnosis not present

## 2024-03-23 DIAGNOSIS — R809 Proteinuria, unspecified: Secondary | ICD-10-CM | POA: Diagnosis not present

## 2024-03-29 ENCOUNTER — Other Ambulatory Visit: Payer: Self-pay

## 2024-03-31 ENCOUNTER — Encounter: Payer: Self-pay | Admitting: Dietician

## 2024-03-31 ENCOUNTER — Encounter: Attending: Nurse Practitioner | Admitting: Dietician

## 2024-03-31 VITALS — Ht 67.0 in | Wt 120.0 lb

## 2024-03-31 DIAGNOSIS — Z713 Dietary counseling and surveillance: Secondary | ICD-10-CM | POA: Diagnosis not present

## 2024-03-31 DIAGNOSIS — E119 Type 2 diabetes mellitus without complications: Secondary | ICD-10-CM

## 2024-03-31 DIAGNOSIS — E785 Hyperlipidemia, unspecified: Secondary | ICD-10-CM | POA: Insufficient documentation

## 2024-03-31 DIAGNOSIS — E1140 Type 2 diabetes mellitus with diabetic neuropathy, unspecified: Secondary | ICD-10-CM | POA: Diagnosis not present

## 2024-03-31 DIAGNOSIS — Z794 Long term (current) use of insulin: Secondary | ICD-10-CM | POA: Insufficient documentation

## 2024-03-31 DIAGNOSIS — E1165 Type 2 diabetes mellitus with hyperglycemia: Secondary | ICD-10-CM | POA: Insufficient documentation

## 2024-03-31 DIAGNOSIS — E1169 Type 2 diabetes mellitus with other specified complication: Secondary | ICD-10-CM | POA: Insufficient documentation

## 2024-03-31 DIAGNOSIS — N184 Chronic kidney disease, stage 4 (severe): Secondary | ICD-10-CM

## 2024-03-31 NOTE — Progress Notes (Signed)
 Diabetes Self-Management Education  Visit Type: First/Initial  Appt. Start Time: 1045 Appt. End Time: 1220  03/31/2024  [name], identified by name and date of birth, is a 78 y.o. female with a diagnosis of Diabetes: Type 2.   ASSESSMENT  Height 5\' 7"  (1.702 m), weight 120 lb (54.4 kg). Body mass index is 18.79 kg/m.   Diabetes Self-Management Education - 03/31/24 1103       Visit Information   Visit Type First/Initial      Initial Visit   Diabetes Type Type 2    Date Diagnosed about 20 years ago per patient    Are you currently following a meal plan? No    Are you taking your medications as prescribed? Yes      Health Coping   How would you rate your overall health? Fair      Psychosocial Assessment   Patient Belief/Attitude about Diabetes Afraid    What is the hardest part about your diabetes right now, causing you the most concern, or is the most worrisome to you about your diabetes?   Making healty food and beverage choices;Other (comment)   kidney health   Self-care barriers None    Self-management support Family    Special Needs Simplified materials    Learning Readiness Ready    How often do you need to have someone help you when you read instructions, pamphlets, or other written materials from your doctor or pharmacy? 3 - Sometimes    What is the last grade level you completed in school? GED      Pre-Education Assessment   Patient understands the diabetes disease and treatment process. Comprehends key points    Patient understands incorporating nutritional management into lifestyle. Needs Review    Patient undertands incorporating physical activity into lifestyle. Needs Review    Patient understands using medications safely. Comprehends key points    Patient understands monitoring blood glucose, interpreting and using results Comprehends key points    Patient understands prevention, detection, and treatment of acute complications. Needs Review     Patient understands prevention, detection, and treatment of chronic complications. Compreheands key points    Patient understands how to develop strategies to address psychosocial issues. Needs Review    Patient understands how to develop strategies to promote health/change behavior. Comprehends key points      Complications   Last HgB A1C per patient/outside source 8.9 %    How often do you check your blood sugar? 1-2 times/day   freestyle libre continuous sensor   Fasting Blood glucose range (mg/dL) 010-272    Postprandial Blood glucose range (mg/dL) 53-664;<40;>347;425-956;387-564   drops low during the night at times   Number of hypoglycemic episodes per month 1    Can you tell when your blood sugar is low? Yes   feels weak   What do you do if your blood sugar is low? eats something -- candy or crackers, or drink    Number of hyperglycemic episodes ( >200mg /dL): Weekly    Have you had a dilated eye exam in the past 12 months? Yes    Have you had a dental exam in the past 12 months? Yes    Are you checking your feet? Yes    How many days per week are you checking your feet? 1      Dietary Intake   Breakfast boiled/ cooked egg sometimes with added cheese, toast occasionally with jelly    Snack (morning) Glucerna; Silk chocolate or  almond milk    Lunch maybe sandwich, does not finish    Snack (afternoon) crackers    Dinner cubed steak, stewed or creamed  potatoes, +/or cabbage; soft foods    Beverage(s) water, diet Mt Dew      Activity / Exercise   Activity / Exercise Type ADL's   back pain limits activity     Patient Education   Previous Diabetes Education Yes (please comment)   several years ago   Healthy Eating Food label reading, portion sizes and measuring food.;Meal timing in regards to the patients' current diabetes medication.;Meal options for control of blood glucose level and chronic complications.;Other (comment)   controlled protein, low sodium, low phosphorus pattern for  stage 4 CKD   Medications Other (comment)   potential need for phosphorus binder due to CKD   Monitoring Other (comment);Identified appropriate SMBG and/or A1C goals.   interpreting CGM readings   Acute complications Taught prevention, symptoms, and  treatment of hypoglycemia - the 15 rule.      Individualized Goals (developed by patient)   Nutrition Follow meal plan discussed    Problem Solving Other (comment);Eating Pattern   preserving kidney function, weight gain     Outcomes   Expected Outcomes Demonstrated interest in learning. Expect positive outcomes    Future DMSE 4-6 wks             Individualized Plan for Diabetes Self-Management Training:   Learning Objective:  Patient will have a greater understanding of diabetes self-management. Patient education plan is to attend individual and/or group sessions per assessed needs and concerns.   Plan:   Patient Instructions  Eat a small meal or snack every 3-4 hours during the day. Keep some snacks close by, in easy reach, to encourage/ remind yourself to eat.   If blood sugar drops below 70, take 4oz regular (not diet) soda, or juice, or 4-5 pieces soft peppermints, or 4 glucose tablets. Allow 15 minutes, then recheck blood sugar. If still low, repeat the treatment. If improved, eat a snack or meal within 30 minutes.   Include a small serving of a protein food with each meal or snack, like egg(s), lean meat, Glucerna once a day, beans every few days, peanut butter 1-2 Tablespoons Have a small portion of a starchy food like crackers, rice, noodles Try Mrs Lindalee Retort seasonings on meats, vegetables, rice, noodles, potatoes. Do not use salt substitutes.  When cooking potatoes, peel and cut into cubes, then soak in plain water for a while, then pour off the water and cook in fresh water. This reduces the potassium in the potatoes.   Expected Outcomes:  Demonstrated interest in learning. Expect positive outcomes  Education material  provided: Renal Disease food pyramid (abbott); protein tips for CKD; Low sodium tips for CKD; personalized menus  If problems or questions, patient to contact team via:  Phone  Future DSME appointment: 4-6 wks 04/28/24

## 2024-03-31 NOTE — Patient Instructions (Addendum)
 Eat a small meal or snack every 3-4 hours during the day. Keep some snacks close by, in easy reach, to encourage/ remind yourself to eat.  If blood sugar drops below 70, take 4oz regular (not diet) soda, or juice, or 4-5 pieces soft peppermints, or 4 glucose tablets. Allow 15 minutes, then recheck blood sugar. If still low, repeat the treatment. If improved, eat a snack or meal within 30 minutes.  Include a small serving of a protein food with each meal or snack, like egg(s), lean meat, Glucerna once a day, beans every few days, peanut butter 1-2 Tablespoons Have a small portion of a starchy food like crackers, rice, noodles Try Mrs Lindalee Retort seasonings on meats, vegetables, rice, noodles, potatoes. Do not use salt substitutes.  When cooking potatoes, peel and cut into cubes, then soak in plain water for a while, then pour off the water and cook in fresh water. This reduces the potassium in the potatoes.

## 2024-04-04 DIAGNOSIS — R809 Proteinuria, unspecified: Secondary | ICD-10-CM | POA: Diagnosis not present

## 2024-04-04 DIAGNOSIS — E1121 Type 2 diabetes mellitus with diabetic nephropathy: Secondary | ICD-10-CM | POA: Diagnosis not present

## 2024-04-04 DIAGNOSIS — E113393 Type 2 diabetes mellitus with moderate nonproliferative diabetic retinopathy without macular edema, bilateral: Secondary | ICD-10-CM | POA: Diagnosis not present

## 2024-04-04 DIAGNOSIS — N184 Chronic kidney disease, stage 4 (severe): Secondary | ICD-10-CM | POA: Diagnosis not present

## 2024-04-04 DIAGNOSIS — E119 Type 2 diabetes mellitus without complications: Secondary | ICD-10-CM | POA: Diagnosis not present

## 2024-04-04 DIAGNOSIS — E1122 Type 2 diabetes mellitus with diabetic chronic kidney disease: Secondary | ICD-10-CM | POA: Diagnosis not present

## 2024-04-04 DIAGNOSIS — E1169 Type 2 diabetes mellitus with other specified complication: Secondary | ICD-10-CM | POA: Diagnosis not present

## 2024-04-04 DIAGNOSIS — Z794 Long term (current) use of insulin: Secondary | ICD-10-CM | POA: Diagnosis not present

## 2024-04-04 DIAGNOSIS — E1129 Type 2 diabetes mellitus with other diabetic kidney complication: Secondary | ICD-10-CM | POA: Diagnosis not present

## 2024-04-05 ENCOUNTER — Other Ambulatory Visit: Payer: Self-pay

## 2024-04-06 ENCOUNTER — Inpatient Hospital Stay: Attending: Oncology

## 2024-04-06 ENCOUNTER — Inpatient Hospital Stay

## 2024-04-06 VITALS — BP 153/88

## 2024-04-06 DIAGNOSIS — N184 Chronic kidney disease, stage 4 (severe): Secondary | ICD-10-CM | POA: Diagnosis not present

## 2024-04-06 DIAGNOSIS — D638 Anemia in other chronic diseases classified elsewhere: Secondary | ICD-10-CM

## 2024-04-06 DIAGNOSIS — D631 Anemia in chronic kidney disease: Secondary | ICD-10-CM | POA: Diagnosis not present

## 2024-04-06 LAB — HEMOGLOBIN AND HEMATOCRIT (CANCER CENTER ONLY)
HCT: 30.7 % — ABNORMAL LOW (ref 36.0–46.0)
Hemoglobin: 9.6 g/dL — ABNORMAL LOW (ref 12.0–15.0)

## 2024-04-06 MED ORDER — EPOETIN ALFA-EPBX 10000 UNIT/ML IJ SOLN
10000.0000 [IU] | Freq: Once | INTRAMUSCULAR | Status: AC
  Administered 2024-04-06: 10000 [IU] via SUBCUTANEOUS

## 2024-04-27 DIAGNOSIS — R809 Proteinuria, unspecified: Secondary | ICD-10-CM | POA: Diagnosis not present

## 2024-04-27 DIAGNOSIS — E1122 Type 2 diabetes mellitus with diabetic chronic kidney disease: Secondary | ICD-10-CM | POA: Diagnosis not present

## 2024-04-27 DIAGNOSIS — N184 Chronic kidney disease, stage 4 (severe): Secondary | ICD-10-CM | POA: Diagnosis not present

## 2024-04-27 DIAGNOSIS — D631 Anemia in chronic kidney disease: Secondary | ICD-10-CM | POA: Diagnosis not present

## 2024-04-27 DIAGNOSIS — I129 Hypertensive chronic kidney disease with stage 1 through stage 4 chronic kidney disease, or unspecified chronic kidney disease: Secondary | ICD-10-CM | POA: Diagnosis not present

## 2024-04-27 DIAGNOSIS — N2581 Secondary hyperparathyroidism of renal origin: Secondary | ICD-10-CM | POA: Diagnosis not present

## 2024-04-27 DIAGNOSIS — E875 Hyperkalemia: Secondary | ICD-10-CM | POA: Diagnosis not present

## 2024-04-28 ENCOUNTER — Ambulatory Visit: Admitting: Dietician

## 2024-04-29 ENCOUNTER — Other Ambulatory Visit: Payer: Self-pay

## 2024-05-05 ENCOUNTER — Encounter: Payer: Self-pay | Admitting: Dietician

## 2024-05-05 ENCOUNTER — Encounter: Attending: Nurse Practitioner | Admitting: Dietician

## 2024-05-05 VITALS — Wt 112.3 lb

## 2024-05-05 DIAGNOSIS — N184 Chronic kidney disease, stage 4 (severe): Secondary | ICD-10-CM | POA: Diagnosis not present

## 2024-05-05 DIAGNOSIS — E119 Type 2 diabetes mellitus without complications: Secondary | ICD-10-CM | POA: Diagnosis not present

## 2024-05-05 DIAGNOSIS — Z794 Long term (current) use of insulin: Secondary | ICD-10-CM | POA: Insufficient documentation

## 2024-05-05 NOTE — Patient Instructions (Signed)
 Eat or drink something nutritious every 2-3 hours to help increase calories and gain weight. Try a schedule: 11am eggs or glucerna or yogurt with fruit 1:30pm sandwich or chicken salad and crackers or leftovers from dinner 3:30 pm silk chocolate milk and graham cracker (try dipping cracker in milk to soften it) 6:30 pm dinner with meat, potato or rice or pasta, and vegetable 9:30 pm glucerna or silk chocolate milk or yogurt and fruit  Keep some easy-to -eat foods close by all the time -- unsalted or lightly salted crackers, fruit like banana or applesauce or peaches; peanut butter, cheese. Stay in touch with Cassandra about blood sugar monitor (Libre 3+) and insulin  instructions, what is safe to take when you are not able to check blood sugar at home.

## 2024-05-05 NOTE — Progress Notes (Signed)
 Diabetes Self-Management Education  Visit Type:  Follow-up  Appt. Start Time: 1030 Appt. End Time: 1125  05/05/2024  Tonya [name], identified by name and date of birth, is a 78 y.o. female with a diagnosis of Diabetes:  .   ASSESSMENT   BG of 423 04/27/24; patient states she has not been taking Humalog with meals because she is currently does not have a means to test blood sugars; she states she is waiting on a Libre 3+ CGM. Advised her to consult with endocrinology provider regarding taking insulin  or adjusting if needed until she begins CGM.  Weight 112 lb 4.8 oz (50.9 kg). Body mass index is 17.59 kg/m.    Diabetes Self-Management Education - 05/05/24 1041       Complications   How often do you check your blood sugar? 0 times/day (not testing)   waiting to receive Freestyle Libre 3+ CGM   Number of hyperglycemic episodes ( >200mg /dL): Weekly    Have you had a dilated eye exam in the past 12 months? Yes    Have you had a dental exam in the past 12 months? Yes    Are you checking your feet? Yes    How many days per week are you checking your feet? 1      Dietary Intake   Breakfast 1-2 boiled or fried eggs, occasionally toast    Snack (morning) Silk choc milk or glucerna    Lunch sometimes crachers with cheese or PB (packaged), or sandwich    Snack (afternoon) crackers, diet Mt Dew    Dinner pinto beans, potatoes, turnip greens few bites (too hard to chew much)      Activity / Exercise   Activity / Exercise Type ADL's;Other (comment)   limited activity due to fatigue, lack of energy     Patient Education   Healthy Eating Meal timing in regards to the patients' current diabetes medication.;Meal options for control of blood glucose level and chronic complications.;Other (comment)   soft food choices, calorie-dense food options to promote weight gain   Medications Other (comment)   importance of insulin  in preventing hyperglycemia and subsequently kidney health   Monitoring  Purpose and frequency of SMBG.    Lifestyle and Health Coping Other (comment)   communicating with healthcare providers to ensure appropriate care     Individualized Goals (developed by patient)   Nutrition Follow meal plan discussed;Other (comment)   eat every 2-3 hours and keep snacks close by at all times   Medications take my medication as prescribed   discuss insulin  adjustment with provider   Problem Solving Eating Pattern   for weight gain            Learning Objective:  Patient will have a greater understanding of diabetes self-management. Patient education plan is to attend individual and/or group sessions per assessed needs and concerns.   Plan:   Patient Instructions  Eat or drink something nutritious every 2-3 hours to help increase calories and gain weight. Try a schedule: 11am eggs or glucerna or yogurt with fruit 1:30pm sandwich or chicken salad and crackers or leftovers from dinner 3:30 pm silk chocolate milk and graham cracker (try dipping cracker in milk to soften it) 6:30 pm dinner with meat, potato or rice or pasta, and vegetable 9:30 pm glucerna or silk chocolate milk or yogurt and fruit  Keep some easy-to -eat foods close by all the time -- unsalted or lightly salted crackers, fruit like banana or applesauce or peaches;  peanut butter, cheese. Stay in touch with Cassandra about blood sugar monitor (Libre 3+) and insulin  instructions, what is safe to take when you are not able to check blood sugar at home.    Expected Outcomes:  Demonstrated interest in learning. Expect positive outcomes  Education material provided:   If problems or questions, patient to contact team via:  Phone  Future DSME appointment: - 2 months

## 2024-05-12 ENCOUNTER — Encounter: Payer: Self-pay | Admitting: Dietician

## 2024-05-15 DIAGNOSIS — C250 Malignant neoplasm of head of pancreas: Secondary | ICD-10-CM | POA: Diagnosis not present

## 2024-05-15 DIAGNOSIS — K449 Diaphragmatic hernia without obstruction or gangrene: Secondary | ICD-10-CM | POA: Diagnosis not present

## 2024-05-15 DIAGNOSIS — Z681 Body mass index (BMI) 19 or less, adult: Secondary | ICD-10-CM | POA: Diagnosis not present

## 2024-05-15 DIAGNOSIS — M8580 Other specified disorders of bone density and structure, unspecified site: Secondary | ICD-10-CM | POA: Diagnosis not present

## 2024-05-15 DIAGNOSIS — Z8673 Personal history of transient ischemic attack (TIA), and cerebral infarction without residual deficits: Secondary | ICD-10-CM | POA: Diagnosis not present

## 2024-05-15 DIAGNOSIS — R109 Unspecified abdominal pain: Secondary | ICD-10-CM | POA: Diagnosis not present

## 2024-05-15 DIAGNOSIS — E1140 Type 2 diabetes mellitus with diabetic neuropathy, unspecified: Secondary | ICD-10-CM | POA: Diagnosis not present

## 2024-05-15 DIAGNOSIS — N184 Chronic kidney disease, stage 4 (severe): Secondary | ICD-10-CM | POA: Diagnosis not present

## 2024-05-15 DIAGNOSIS — K869 Disease of pancreas, unspecified: Secondary | ICD-10-CM | POA: Diagnosis not present

## 2024-05-15 DIAGNOSIS — Z794 Long term (current) use of insulin: Secondary | ICD-10-CM | POA: Diagnosis not present

## 2024-05-15 DIAGNOSIS — K5641 Fecal impaction: Secondary | ICD-10-CM | POA: Diagnosis not present

## 2024-05-15 DIAGNOSIS — I130 Hypertensive heart and chronic kidney disease with heart failure and stage 1 through stage 4 chronic kidney disease, or unspecified chronic kidney disease: Secondary | ICD-10-CM | POA: Diagnosis not present

## 2024-05-15 DIAGNOSIS — F419 Anxiety disorder, unspecified: Secondary | ICD-10-CM | POA: Diagnosis not present

## 2024-05-15 DIAGNOSIS — E875 Hyperkalemia: Secondary | ICD-10-CM | POA: Diagnosis not present

## 2024-05-15 DIAGNOSIS — K5900 Constipation, unspecified: Secondary | ICD-10-CM | POA: Diagnosis not present

## 2024-05-15 DIAGNOSIS — R634 Abnormal weight loss: Secondary | ICD-10-CM | POA: Diagnosis not present

## 2024-05-15 DIAGNOSIS — K8689 Other specified diseases of pancreas: Secondary | ICD-10-CM | POA: Diagnosis not present

## 2024-05-15 DIAGNOSIS — R609 Edema, unspecified: Secondary | ICD-10-CM | POA: Diagnosis not present

## 2024-05-15 DIAGNOSIS — N200 Calculus of kidney: Secondary | ICD-10-CM | POA: Diagnosis not present

## 2024-05-15 DIAGNOSIS — H538 Other visual disturbances: Secondary | ICD-10-CM | POA: Diagnosis not present

## 2024-05-15 DIAGNOSIS — D631 Anemia in chronic kidney disease: Secondary | ICD-10-CM | POA: Diagnosis not present

## 2024-05-15 DIAGNOSIS — N2889 Other specified disorders of kidney and ureter: Secondary | ICD-10-CM | POA: Diagnosis not present

## 2024-05-15 DIAGNOSIS — E1122 Type 2 diabetes mellitus with diabetic chronic kidney disease: Secondary | ICD-10-CM | POA: Diagnosis not present

## 2024-05-15 DIAGNOSIS — M47819 Spondylosis without myelopathy or radiculopathy, site unspecified: Secondary | ICD-10-CM | POA: Diagnosis not present

## 2024-05-15 DIAGNOSIS — E43 Unspecified severe protein-calorie malnutrition: Secondary | ICD-10-CM | POA: Diagnosis not present

## 2024-05-15 DIAGNOSIS — I6782 Cerebral ischemia: Secondary | ICD-10-CM | POA: Diagnosis not present

## 2024-05-15 DIAGNOSIS — F32A Depression, unspecified: Secondary | ICD-10-CM | POA: Diagnosis not present

## 2024-05-15 DIAGNOSIS — E860 Dehydration: Secondary | ICD-10-CM | POA: Diagnosis not present

## 2024-05-15 DIAGNOSIS — K862 Cyst of pancreas: Secondary | ICD-10-CM | POA: Diagnosis not present

## 2024-05-15 DIAGNOSIS — E1165 Type 2 diabetes mellitus with hyperglycemia: Secondary | ICD-10-CM | POA: Diagnosis not present

## 2024-05-15 DIAGNOSIS — R911 Solitary pulmonary nodule: Secondary | ICD-10-CM | POA: Diagnosis not present

## 2024-05-15 DIAGNOSIS — N189 Chronic kidney disease, unspecified: Secondary | ICD-10-CM | POA: Diagnosis not present

## 2024-05-15 DIAGNOSIS — R4182 Altered mental status, unspecified: Secondary | ICD-10-CM | POA: Diagnosis not present

## 2024-05-15 DIAGNOSIS — E785 Hyperlipidemia, unspecified: Secondary | ICD-10-CM | POA: Diagnosis not present

## 2024-05-15 DIAGNOSIS — R5381 Other malaise: Secondary | ICD-10-CM | POA: Diagnosis not present

## 2024-05-15 DIAGNOSIS — R54 Age-related physical debility: Secondary | ICD-10-CM | POA: Diagnosis not present

## 2024-05-15 DIAGNOSIS — K5730 Diverticulosis of large intestine without perforation or abscess without bleeding: Secondary | ICD-10-CM | POA: Diagnosis not present

## 2024-05-15 DIAGNOSIS — I5032 Chronic diastolic (congestive) heart failure: Secondary | ICD-10-CM | POA: Diagnosis not present

## 2024-05-15 DIAGNOSIS — I129 Hypertensive chronic kidney disease with stage 1 through stage 4 chronic kidney disease, or unspecified chronic kidney disease: Secondary | ICD-10-CM | POA: Diagnosis not present

## 2024-05-15 DIAGNOSIS — I5030 Unspecified diastolic (congestive) heart failure: Secondary | ICD-10-CM | POA: Diagnosis not present

## 2024-05-16 ENCOUNTER — Telehealth: Payer: Self-pay

## 2024-05-16 NOTE — Progress Notes (Signed)
 Complex Care Management Care Guide Note  05/16/2024 Name: Tonya [name] MRN: [id number] DOB: [birth year]  Tonya [name] is a 78 y.o. year old female who is a primary care patient of Cannady, [name] T, NP and is actively engaged with the care management team. I reached out to Lesle Ras by phone today to assist with re-scheduling  with the RN Case Manager.  Follow up plan: Unsuccessful telephone outreach attempt made. A HIPAA compliant phone message was left for the patient providing contact information and requesting a return call.  Lenton Rail , RMA     [facility]  [facility] Guide  Direct Dial: [phone]  Website: [email or web address]

## 2024-05-18 ENCOUNTER — Inpatient Hospital Stay: Attending: Oncology

## 2024-05-18 ENCOUNTER — Telehealth: Payer: Self-pay

## 2024-05-18 ENCOUNTER — Inpatient Hospital Stay

## 2024-05-18 DIAGNOSIS — N184 Chronic kidney disease, stage 4 (severe): Secondary | ICD-10-CM

## 2024-05-18 NOTE — Telephone Encounter (Signed)
 Can orders be placed for the patient? Patient has hospital follow up scheduled for next week.

## 2024-05-18 NOTE — Telephone Encounter (Signed)
 Copied from CRM [id number]. Topic: Clinical - Request for Lab/Test Order >> May 18, 2024  2:06 PM Opal Bill wrote: Reason for CRM: Alma called in stating that the pt is discharging from [facility] today and needs to do repeat labs for BMP on 05/20/24 per the doctor at [facility] Please follow up with patient for a time on the 6th when orders are placed.

## 2024-05-20 ENCOUNTER — Telehealth: Payer: Self-pay

## 2024-05-20 NOTE — Telephone Encounter (Signed)
 Copied from CRM [id number]. Topic: General - Other >> May 19, 2024  3:18 PM Oddis Bench wrote: Reason for CRM: [facility] calling to verify PCP and to get verbal consent that Dr will continue patients care PT and OT. [phone] with providing remote patient monitor or the facility.

## 2024-05-20 NOTE — Telephone Encounter (Signed)
 Copied from CRM [id number]. Topic: Clinical - Home Health Verbal Orders >> May 20, 2024 12:45 PM Star East wrote: Caller/Agency: Belva Boyden with [facility] home [facility] Callback Number: [phone] Service Requested: Occupational Therapy and Physical Therapy, vital checks Frequency: need to assess Any new concerns about the patient? No

## 2024-05-20 NOTE — Telephone Encounter (Signed)
Called and LVM giving verbal orders per [name].  ?

## 2024-05-22 LAB — HEMOGLOBIN A1C: A1c: 11.9

## 2024-05-22 NOTE — Patient Instructions (Signed)

## 2024-05-23 ENCOUNTER — Inpatient Hospital Stay: Admitting: Nurse Practitioner

## 2024-05-23 DIAGNOSIS — R809 Proteinuria, unspecified: Secondary | ICD-10-CM | POA: Diagnosis not present

## 2024-05-23 DIAGNOSIS — E119 Type 2 diabetes mellitus without complications: Secondary | ICD-10-CM | POA: Diagnosis not present

## 2024-05-23 DIAGNOSIS — E1121 Type 2 diabetes mellitus with diabetic nephropathy: Secondary | ICD-10-CM | POA: Diagnosis not present

## 2024-05-23 DIAGNOSIS — E875 Hyperkalemia: Secondary | ICD-10-CM | POA: Diagnosis not present

## 2024-05-23 DIAGNOSIS — E1129 Type 2 diabetes mellitus with other diabetic kidney complication: Secondary | ICD-10-CM | POA: Diagnosis not present

## 2024-05-23 DIAGNOSIS — Z794 Long term (current) use of insulin: Secondary | ICD-10-CM | POA: Diagnosis not present

## 2024-05-23 DIAGNOSIS — E1122 Type 2 diabetes mellitus with diabetic chronic kidney disease: Secondary | ICD-10-CM | POA: Diagnosis not present

## 2024-05-23 DIAGNOSIS — E113393 Type 2 diabetes mellitus with moderate nonproliferative diabetic retinopathy without macular edema, bilateral: Secondary | ICD-10-CM | POA: Diagnosis not present

## 2024-05-23 DIAGNOSIS — N184 Chronic kidney disease, stage 4 (severe): Secondary | ICD-10-CM | POA: Diagnosis not present

## 2024-05-23 DIAGNOSIS — C250 Malignant neoplasm of head of pancreas: Secondary | ICD-10-CM | POA: Diagnosis not present

## 2024-05-23 NOTE — Telephone Encounter (Signed)
 Patient has an upcomming appt this week

## 2024-05-24 ENCOUNTER — Telehealth: Payer: Self-pay

## 2024-05-24 DIAGNOSIS — E1142 Type 2 diabetes mellitus with diabetic polyneuropathy: Secondary | ICD-10-CM | POA: Diagnosis not present

## 2024-05-24 DIAGNOSIS — I130 Hypertensive heart and chronic kidney disease with heart failure and stage 1 through stage 4 chronic kidney disease, or unspecified chronic kidney disease: Secondary | ICD-10-CM | POA: Diagnosis not present

## 2024-05-24 DIAGNOSIS — K5730 Diverticulosis of large intestine without perforation or abscess without bleeding: Secondary | ICD-10-CM | POA: Diagnosis not present

## 2024-05-24 DIAGNOSIS — I5032 Chronic diastolic (congestive) heart failure: Secondary | ICD-10-CM | POA: Diagnosis not present

## 2024-05-24 DIAGNOSIS — C250 Malignant neoplasm of head of pancreas: Secondary | ICD-10-CM | POA: Diagnosis not present

## 2024-05-24 DIAGNOSIS — D631 Anemia in chronic kidney disease: Secondary | ICD-10-CM | POA: Diagnosis not present

## 2024-05-24 DIAGNOSIS — J432 Centrilobular emphysema: Secondary | ICD-10-CM | POA: Diagnosis not present

## 2024-05-24 DIAGNOSIS — J438 Other emphysema: Secondary | ICD-10-CM | POA: Diagnosis not present

## 2024-05-24 DIAGNOSIS — F419 Anxiety disorder, unspecified: Secondary | ICD-10-CM | POA: Diagnosis not present

## 2024-05-24 DIAGNOSIS — E278 Other specified disorders of adrenal gland: Secondary | ICD-10-CM | POA: Diagnosis not present

## 2024-05-24 DIAGNOSIS — B0229 Other postherpetic nervous system involvement: Secondary | ICD-10-CM | POA: Diagnosis not present

## 2024-05-24 DIAGNOSIS — N184 Chronic kidney disease, stage 4 (severe): Secondary | ICD-10-CM | POA: Diagnosis not present

## 2024-05-24 DIAGNOSIS — R630 Anorexia: Secondary | ICD-10-CM | POA: Diagnosis not present

## 2024-05-24 DIAGNOSIS — F32A Depression, unspecified: Secondary | ICD-10-CM | POA: Diagnosis not present

## 2024-05-24 DIAGNOSIS — E119 Type 2 diabetes mellitus without complications: Secondary | ICD-10-CM | POA: Diagnosis not present

## 2024-05-24 DIAGNOSIS — E782 Mixed hyperlipidemia: Secondary | ICD-10-CM | POA: Diagnosis not present

## 2024-05-24 DIAGNOSIS — M25562 Pain in left knee: Secondary | ICD-10-CM | POA: Diagnosis not present

## 2024-05-24 DIAGNOSIS — G8929 Other chronic pain: Secondary | ICD-10-CM | POA: Diagnosis not present

## 2024-05-24 DIAGNOSIS — E1165 Type 2 diabetes mellitus with hyperglycemia: Secondary | ICD-10-CM | POA: Diagnosis not present

## 2024-05-24 DIAGNOSIS — I2510 Atherosclerotic heart disease of native coronary artery without angina pectoris: Secondary | ICD-10-CM | POA: Diagnosis not present

## 2024-05-24 DIAGNOSIS — R634 Abnormal weight loss: Secondary | ICD-10-CM | POA: Diagnosis not present

## 2024-05-24 DIAGNOSIS — K5900 Constipation, unspecified: Secondary | ICD-10-CM | POA: Diagnosis not present

## 2024-05-24 DIAGNOSIS — M7070 Other bursitis of hip, unspecified hip: Secondary | ICD-10-CM | POA: Diagnosis not present

## 2024-05-24 DIAGNOSIS — R911 Solitary pulmonary nodule: Secondary | ICD-10-CM | POA: Diagnosis not present

## 2024-05-24 NOTE — Telephone Encounter (Signed)
 Copied from CRM [id number]. Topic: General - Other >> May 24, 2024  3:02 PM Santiya F wrote: Reason for CRM: Jullie Oiler, a Physical Therapist with [facility] is calling in for authorization to continue patient's physical therapy at home 2 times a week for 4 weeks.

## 2024-05-24 NOTE — Telephone Encounter (Signed)
 Called and gave verbal order per [name].

## 2024-05-25 DIAGNOSIS — N184 Chronic kidney disease, stage 4 (severe): Secondary | ICD-10-CM | POA: Diagnosis not present

## 2024-05-25 DIAGNOSIS — G6289 Other specified polyneuropathies: Secondary | ICD-10-CM | POA: Diagnosis not present

## 2024-05-25 DIAGNOSIS — C250 Malignant neoplasm of head of pancreas: Secondary | ICD-10-CM | POA: Diagnosis not present

## 2024-05-25 DIAGNOSIS — Z79899 Other long term (current) drug therapy: Secondary | ICD-10-CM | POA: Diagnosis not present

## 2024-05-25 DIAGNOSIS — E1140 Type 2 diabetes mellitus with diabetic neuropathy, unspecified: Secondary | ICD-10-CM | POA: Diagnosis not present

## 2024-05-26 ENCOUNTER — Encounter: Payer: Self-pay | Admitting: Nurse Practitioner

## 2024-05-26 ENCOUNTER — Ambulatory Visit (INDEPENDENT_AMBULATORY_CARE_PROVIDER_SITE_OTHER): Admitting: Nurse Practitioner

## 2024-05-26 VITALS — BP 125/66 | HR 77 | Temp 98.0°F | Ht 67.0 in | Wt 115.0 lb

## 2024-05-26 DIAGNOSIS — G894 Chronic pain syndrome: Secondary | ICD-10-CM

## 2024-05-26 DIAGNOSIS — E1165 Type 2 diabetes mellitus with hyperglycemia: Secondary | ICD-10-CM | POA: Diagnosis not present

## 2024-05-26 DIAGNOSIS — E119 Type 2 diabetes mellitus without complications: Secondary | ICD-10-CM

## 2024-05-26 DIAGNOSIS — Z1231 Encounter for screening mammogram for malignant neoplasm of breast: Secondary | ICD-10-CM | POA: Diagnosis not present

## 2024-05-26 DIAGNOSIS — E1140 Type 2 diabetes mellitus with diabetic neuropathy, unspecified: Secondary | ICD-10-CM | POA: Diagnosis not present

## 2024-05-26 DIAGNOSIS — Z780 Asymptomatic menopausal state: Secondary | ICD-10-CM | POA: Diagnosis not present

## 2024-05-26 DIAGNOSIS — E785 Hyperlipidemia, unspecified: Secondary | ICD-10-CM | POA: Diagnosis not present

## 2024-05-26 DIAGNOSIS — I5032 Chronic diastolic (congestive) heart failure: Secondary | ICD-10-CM

## 2024-05-26 DIAGNOSIS — E1169 Type 2 diabetes mellitus with other specified complication: Secondary | ICD-10-CM | POA: Diagnosis not present

## 2024-05-26 DIAGNOSIS — K8689 Other specified diseases of pancreas: Secondary | ICD-10-CM

## 2024-05-26 DIAGNOSIS — N2581 Secondary hyperparathyroidism of renal origin: Secondary | ICD-10-CM | POA: Diagnosis not present

## 2024-05-26 DIAGNOSIS — C250 Malignant neoplasm of head of pancreas: Secondary | ICD-10-CM

## 2024-05-26 DIAGNOSIS — N184 Chronic kidney disease, stage 4 (severe): Secondary | ICD-10-CM | POA: Diagnosis not present

## 2024-05-26 DIAGNOSIS — Z794 Long term (current) use of insulin: Secondary | ICD-10-CM

## 2024-05-26 DIAGNOSIS — I130 Hypertensive heart and chronic kidney disease with heart failure and stage 1 through stage 4 chronic kidney disease, or unspecified chronic kidney disease: Secondary | ICD-10-CM

## 2024-05-26 NOTE — Progress Notes (Signed)
 BP 125/66   Pulse 77   Temp 98 F (36.7 C) (Oral)   Ht 5' 7 (1.702 m)   Wt 115 lb (52.2 kg)   LMP  (LMP Unknown)   SpO2 98%   BMI 18.01 kg/m    Subjective:    Patient ID: Tonya [name], female    DOB: 11-12-1946, 78 y.o.   MRN: [id number]  HPI: Tonya [name] is a 78 y.o. female  Chief Complaint  Patient presents with   Hospitalization Follow-up   Daughter present at bedside with patient to assist with HPI and take notes.  Transition of Care Hospital Follow up.  Admitted to [facility] on 05/15/24 for weakness, hyperglycemia, hyperkalemia, and new finding of a pancreatic mass. A1c at time of admission was 11.9%, she does follow with endocrinology and had last visit 05/23/24 when they started Basaglar 4 units at bedtime + adjusted Humalog regimen 8/8/8.  Saw surgical oncology at [facility] on 05/23/24.  However, both this note and her admission noted are not downloading from [facility] at this time into Epic.  MRI in hospital on 05/18/24 did noted a 4.6 cm multiloculated cystic mass in pancreatic head.  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  Plan is to perform whipple and chemotherapy. Was discharged on 05/19/24.  While in hospital they had to perform enema because she was backed up, Senna and Miralax  ordered which she reports is offering benefit.  Saw nephrology last on 04/27/24, returns to see them Wednesday.    Hospital/Facility: [facility] D/C Physician: Dr. Burdette Carolin D/C Date: 05/19/24  Records Requested: 05/26/24 Records Received: 05/26/24 Records Reviewed: 05/26/24  Diagnoses on Discharge: Pancreatic Mass  Date of interactive Contact within 48 hours of discharge:  Contact was through: 05/20/24  Date of 7 day or 14 day face-to-face visit:    within 7 days  Outpatient Encounter Medications as of 05/26/2024  Medication Sig Note   acetaminophen  (TYLENOL ) 650 MG CR tablet Take 650 mg by mouth every 8 (eight) hours as needed for pain.    aspirin  81 MG tablet Take 81 mg by mouth daily.     carvedilol  (COREG ) 3.125 MG tablet Take 1 tablet (3.125 mg total) by mouth 2 (two) times daily with a meal. PLEASE CALL [phone] TO SCHEDULE YEARLY APPOINTMENT FOR FURTHER REFILLS.  (THIRD ATTEMPT)    Continuous Glucose Sensor (FREESTYLE LIBRE 14 DAY SENSOR) MISC by Does not apply route.    diclofenac  Sodium (VOLTAREN ) 1 % GEL Apply 4 g topically 4 (four) times daily.    Insulin  Glargine (BASAGLAR KWIKPEN) 100 UNIT/ML Inject 4 Units into the skin at bedtime.    insulin  lispro (HUMALOG) 100 UNIT/ML injection Inject 8 Units into the skin 3 (three) times daily before meals.    meclizine  (ANTIVERT ) 12.5 MG tablet Take 12.5 mg by mouth 3 (three) times daily. 03/31/2024: Taking as needed   NOVOFINE 32G X 6 MM MISC USE AS DIRECTED THREE TIMES DAILY WITH HUMALOG    pantoprazole  (PROTONIX ) 40 MG tablet Take 1 tablet (40 mg total) by mouth daily.    pioglitazone  (ACTOS ) 30 MG tablet Take 30 mg by mouth daily.    polyethylene glycol (MIRALAX  / GLYCOLAX ) 17 g packet Take 17 g by mouth 2 (two) times daily.    prochlorperazine (COMPAZINE) 10 MG tablet Take 10 mg by mouth every 6 (six) hours as needed.    rosuvastatin  (CRESTOR ) 10 MG tablet Take 1 tablet (10 mg total) by mouth daily.    senna (SENOKOT)  8.6 MG TABS tablet Take 2 tablets by mouth daily as needed for mild constipation.    simethicone  (MYLICON) 80 MG chewable tablet Chew 1 tablet (80 mg total) by mouth 4 (four) times daily as needed for flatulence.    torsemide  (DEMADEX ) 20 MG tablet TAKE 2 TABLETS(40 MG) BY MOUTH DAILY    vitamin B-12 (CYANOCOBALAMIN ) 100 MCG tablet Take 100 mcg by mouth daily. 02/23/2024: Hasn't refilled   [DISCONTINUED] cyanocobalamin  (VITAMIN B12) 1000 MCG/ML injection Inject 1,000 mcg into the muscle once.    olmesartan  (BENICAR ) 40 MG tablet Take 1 tablet (40 mg total) by mouth daily. (Patient not taking: Reported on 05/26/2024)    [DISCONTINUED] dapagliflozin propanediol (FARXIGA) 10 MG TABS tablet Take 10 mg by mouth daily.  02/23/2024: Approved for patient assistance per pt   [DISCONTINUED] docusate sodium  (COLACE) 100 MG capsule Take 100 mg by mouth daily as needed for mild constipation.    [DISCONTINUED] gabapentin  (NEURONTIN ) 100 MG capsule Take 100 mg by mouth. As needed; has side effect with speech, stuttering per patient    [DISCONTINUED] Vitamin D , Cholecalciferol , 50 MCG (2000 UT) CAPS Take 2,000 Units by mouth daily. (Patient not taking: Reported on 02/23/2024) 02/23/2024: Hasn't refilled   No facility-administered encounter medications on file as of 05/26/2024.    Diagnostic Tests Reviewed/Disposition:     Latest Ref Rng & Units 04/06/2024    1:19 PM 02/24/2024   12:53 PM 01/13/2024    1:54 PM  CBC  WBC 4.0 - 10.5 K/uL  7.4    Hemoglobin 12.0 - 15.0 g/dL 9.6  9.6  16.1   Hematocrit 36.0 - 46.0 % 30.7  30.6  33.2   Platelets 150 - 400 K/uL  291         Latest Ref Rng & Units 08/05/2023    4:46 AM 08/04/2023    5:45 AM 08/03/2023    4:54 AM  CMP  Glucose 70 - 99 mg/dL 096  045  409   BUN 8 - 23 mg/dL 34  43  50   Creatinine 0.44 - 1.00 mg/dL 8.11  9.14  7.82   Sodium 135 - 145 mmol/L 140  140  140   Potassium 3.5 - 5.1 mmol/L 4.4  4.5  4.1   Chloride 98 - 111 mmol/L 114  110  110   CO2 22 - 32 mmol/L 21  21  24    Calcium  8.9 - 10.3 mg/dL 8.0  8.1  7.9     Consults: Oncology and Endocrinology  Discharge Instructions: Follow-up with PCP and oncology  Disease/illness Education: Reviewed with patient and daughter  Home Health/Community Services Discussions/Referrals: Started PT in home with [facility] on 05/24/24 -- they want her to walk 20 minutes a day, goal is to stand up without lift  Establishment or re-establishment of referral orders for community resources: none  Discussion with other health care providers: Reviewed all recent notes  Assessment and Support of treatment regimen adherence: Reviewed with patient and daughter  Appointments Coordinated with: Reviewed with patient and  daughter  Education for self-management, independent living, and ADLs:  Reviewed with patient and daughter     05/26/2024   10:19 AM 03/31/2024   10:53 AM 02/23/2024    1:35 PM 01/08/2024    2:28 PM 07/06/2023    8:38 AM  Depression screen PHQ 2/9  Decreased Interest 1 0 0 0 0  Down, Depressed, Hopeless 0 0 0 0 0  PHQ - 2 Score 1 0 0 0 0  Altered sleeping 3  2 2 1   Tired, decreased energy 3  1 1 1   Change in appetite 3  3 2 1   Feeling bad or failure about yourself  0  0 0 2  Trouble concentrating 0  0 1 0  Moving slowly or fidgety/restless 0  0 0 1  Suicidal thoughts 0  0 0 0  PHQ-9 Score 10  6 6 6   Difficult doing work/chores Not difficult at all  Not difficult at all Not difficult at all Somewhat difficult       05/26/2024   10:19 AM 01/08/2024    2:28 PM 07/06/2023    8:39 AM 06/15/2023    3:35 PM  GAD 7 : Generalized Anxiety Score  Nervous, Anxious, on Edge 0 0 0 0  Control/stop worrying 0 0 1 0  Worry too much - different things 0 1 1 0  Trouble relaxing 0 0 0 0  Restless 0 0 1 0  Easily annoyed or irritable 0 0 0 0  Afraid - awful might happen 0 0 0 0  Total GAD 7 Score 0 1 3 0  Anxiety Difficulty Not difficult at all Not difficult at all Somewhat difficult Not difficult at all   Relevant past medical, surgical, family and social history reviewed and updated as indicated. Interim medical history since our last visit reviewed. Allergies and medications reviewed and updated.  Review of Systems  Constitutional:  Positive for activity change, appetite change, fatigue and unexpected weight change. Negative for diaphoresis and fever.  Respiratory:  Negative for cough, chest tightness, shortness of breath and wheezing.   Cardiovascular:  Negative for chest pain, palpitations and leg swelling.  Gastrointestinal:  Negative for abdominal distention, abdominal pain, constipation, diarrhea, nausea and vomiting.  Endocrine: Negative for polydipsia, polyphagia and polyuria.   Neurological:  Positive for dizziness (occasional vertigo) and weakness. Negative for seizures, facial asymmetry, light-headedness and headaches.  Psychiatric/Behavioral: Negative.     Per HPI unless specifically indicated above     Objective:    BP 125/66   Pulse 77   Temp 98 F (36.7 C) (Oral)   Ht 5' 7 (1.702 m)   Wt 115 lb (52.2 kg)   LMP  (LMP Unknown)   SpO2 98%   BMI 18.01 kg/m   Wt Readings from Last 3 Encounters:  05/26/24 115 lb (52.2 kg)  05/05/24 112 lb 4.8 oz (50.9 kg)  03/31/24 120 lb (54.4 kg)    Physical Exam Vitals and nursing note reviewed.  Constitutional:      General: She is awake. She is not in acute distress.    Appearance: She is well-developed, well-groomed and underweight. She is not ill-appearing or toxic-appearing.  HENT:     Head: Normocephalic.     Right Ear: Hearing and external ear normal.     Left Ear: Hearing and external ear normal.   Eyes:     General: Lids are normal.        Right eye: No discharge.        Left eye: No discharge.     Conjunctiva/sclera: Conjunctivae normal.     Pupils: Pupils are equal, round, and reactive to light.   Neck:     Thyroid : No thyromegaly.     Vascular: No carotid bruit.   Cardiovascular:     Rate and Rhythm: Normal rate and regular rhythm.     Heart sounds: Normal heart sounds. No murmur heard.    No gallop.  Pulmonary:  Effort: Pulmonary effort is normal. No accessory muscle usage or respiratory distress.     Breath sounds: Normal breath sounds.  Abdominal:     General: Bowel sounds are normal. There is no distension.     Palpations: Abdomen is soft. There is no hepatomegaly.     Tenderness: There is no abdominal tenderness.   Musculoskeletal:     Cervical back: Normal range of motion and neck supple.     Right lower leg: Edema (trace) present.     Left lower leg: Edema (trace) present.  Lymphadenopathy:     Cervical: No cervical adenopathy.   Skin:    General: Skin is warm and  dry.   Neurological:     Mental Status: She is alert and oriented to person, place, and time.     Deep Tendon Reflexes: Reflexes are normal and symmetric.     Reflex Scores:      Brachioradialis reflexes are 2+ on the right side and 2+ on the left side.      Patellar reflexes are 2+ on the right side and 2+ on the left side.  Psychiatric:        Attention and Perception: Attention normal.        Mood and Affect: Mood normal.        Speech: Speech normal.        Behavior: Behavior normal. Behavior is cooperative.        Thought Content: Thought content normal.    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  BP at goal in office today.  Continue current medication regimen and collaboration with nephrology.  Labs: up to date. Urine ALB 150 January 2025.  Recommend she continue to monitor BP at home daily and document for providers + focus on DASH diet.  Continue collaboration with cardiology and nephrology.        Chronic heart failure with preserved ejection fraction (HFpEF) (HCC)   Chronic, ongoing with EF 60-65% last check, euvolemic at this time.  Continue current medication regimen and collaboration with cardiology + Bartholomew Light PharmD. - Reminded to call for an overnight weight gain of >2 pounds or a weekly weight weight of >5 pounds -- work with CCM team on obtaining scale. - not adding salt to his food and has been reading food labels. Reviewed the importance of keeping daily sodium intake to 2000mg  daily  - Wear compression hose daily -- not on today and not consistently wearing - No Ibuprofen  at home        Digestive   Malignant neoplasm of head of pancreas [facility]) - Primary   New  diagnosis during June 1st hospitalization.  Will continue to collaborate with oncology at [facility], read all recent notes that were available and notes her daughter brought to office.      Relevant Medications   prochlorperazine (COMPAZINE) 10 MG tablet     Endocrine   Poorly controlled type 2 diabetes mellitus with neuropathy (HCC)   Chronic, ongoing, followed by endocrinology.  A1c 11.9% in hospital recently and urine ALB 150 January 2025. Continue current medication regimen and defer changes to endo.  Cardiology and PCP have recommended to endo discontinuing Actos  due to her HF.  Continue to collaborate with endo and Bartholomew Light PharmD. Recommend she monitor BS at least 3 times a day at home, continue consistent [place] use.   - ARB and Statin on board - Vaccinations up to date - Eye exam due, foot exam up to date      Relevant Medications   Insulin  Glargine (BASAGLAR KWIKPEN) 100 UNIT/ML   Hyperparathyroidism due to renal insufficiency (HCC)   Chronic, ongoing, followed by nephrology with recent labs obtained.  Continue collaboration with nephrology.   Recent note and labs reviewed.        Hyperlipidemia associated with type 2 diabetes mellitus (HCC)   Chronic, ongoing.  Continue Rosuvastatin . Continue to collaborate with [facility]   Lipid panel next visit.      Relevant Medications   Insulin  Glargine (BASAGLAR KWIKPEN) 100 UNIT/ML   Diabetes mellitus type 2, insulin  dependent (HCC)   Refer to diabetes with neuropathy plan of care.      Relevant Medications   Insulin  Glargine (BASAGLAR KWIKPEN) 100 UNIT/ML     Genitourinary   CKD (chronic kidney disease) stage 4, GFR 15-29 ml/min (HCC)   Chronic, ongoing.  Continue current medication regimen and collaboration with nephrology. Labs up to date and reviewed in Care Everywhere. Seeing nephrology upcoming, will defer labs to them.      Other Visit Diagnoses       Postmenopausal estrogen deficiency       Bone density ordered and  provided her daughter with information to schedule.   Relevant Orders   DG Bone Density     Encounter for screening mammogram for malignant neoplasm of breast       Mammogram ordered and provided her daughter with information to schedule.   Relevant Orders   MM 3D SCREENING MAMMOGRAM BILATERAL BREAST        Follow up plan: Return for Cancel 07/19/24 visit and scheduled for November.

## 2024-05-26 NOTE — Assessment & Plan Note (Signed)
 Chronic, ongoing.  Continue current medication regimen and collaboration with nephrology. Labs up to date and reviewed in Care Everywhere. Seeing nephrology upcoming, will defer labs to them.

## 2024-05-26 NOTE — Assessment & Plan Note (Signed)
 Chronic, ongoing, followed by endocrinology.  A1c 11.9% in hospital recently and urine ALB 150 January 2025. Continue current medication regimen and defer changes to endo.  Cardiology and PCP have recommended to endo discontinuing Actos  due to her HF.  Continue to collaborate with endo and Bartholomew Light PharmD. Recommend she monitor BS at least 3 times a day at home, continue consistent [place] use.   - ARB and Statin on board - Vaccinations up to date - Eye exam due, foot exam up to date

## 2024-05-26 NOTE — Assessment & Plan Note (Signed)
 Chronic, ongoing with EF 60-65% last check, euvolemic at this time.  Continue current medication regimen and collaboration with cardiology + Bartholomew Light PharmD. - Reminded to call for an overnight weight gain of >2 pounds or a weekly weight weight of >5 pounds -- work with CCM team on obtaining scale. - not adding salt to his food and has been reading food labels. Reviewed the importance of keeping daily sodium intake to 2000mg  daily  - Wear compression hose daily -- not on today and not consistently wearing - No Ibuprofen  at home

## 2024-05-26 NOTE — Assessment & Plan Note (Signed)
 Chronic, stable.  BP at goal in office today.  Continue current medication regimen and collaboration with nephrology.  Labs: up to date. Urine ALB 150 January 2025.  Recommend she continue to monitor BP at home daily and document for providers + focus on DASH diet.  Continue collaboration with cardiology and nephrology.

## 2024-05-26 NOTE — Assessment & Plan Note (Signed)
 Chronic, ongoing, followed by nephrology with recent labs obtained.  Continue collaboration with nephrology.   Recent note and labs reviewed.

## 2024-05-26 NOTE — Assessment & Plan Note (Signed)
 New diagnosis during June 1st hospitalization.  Will continue to collaborate with oncology at [facility], read all recent notes that were available and notes her daughter brought to office.

## 2024-05-26 NOTE — Assessment & Plan Note (Signed)
 Refer to diabetes with neuropathy plan of care

## 2024-05-26 NOTE — Assessment & Plan Note (Signed)
 Chronic, ongoing.  Continue Rosuvastatin . Continue to collaborate with [facility].   Lipid panel next visit.

## 2024-05-27 DIAGNOSIS — C259 Malignant neoplasm of pancreas, unspecified: Secondary | ICD-10-CM | POA: Diagnosis not present

## 2024-05-27 DIAGNOSIS — Z5111 Encounter for antineoplastic chemotherapy: Secondary | ICD-10-CM | POA: Diagnosis not present

## 2024-05-27 DIAGNOSIS — C250 Malignant neoplasm of head of pancreas: Secondary | ICD-10-CM | POA: Diagnosis not present

## 2024-06-01 DIAGNOSIS — D631 Anemia in chronic kidney disease: Secondary | ICD-10-CM | POA: Diagnosis not present

## 2024-06-01 DIAGNOSIS — N184 Chronic kidney disease, stage 4 (severe): Secondary | ICD-10-CM | POA: Diagnosis not present

## 2024-06-01 DIAGNOSIS — R809 Proteinuria, unspecified: Secondary | ICD-10-CM | POA: Diagnosis not present

## 2024-06-01 DIAGNOSIS — N2581 Secondary hyperparathyroidism of renal origin: Secondary | ICD-10-CM | POA: Diagnosis not present

## 2024-06-01 DIAGNOSIS — E875 Hyperkalemia: Secondary | ICD-10-CM | POA: Diagnosis not present

## 2024-06-01 DIAGNOSIS — E1122 Type 2 diabetes mellitus with diabetic chronic kidney disease: Secondary | ICD-10-CM | POA: Diagnosis not present

## 2024-06-01 DIAGNOSIS — I129 Hypertensive chronic kidney disease with stage 1 through stage 4 chronic kidney disease, or unspecified chronic kidney disease: Secondary | ICD-10-CM | POA: Diagnosis not present

## 2024-06-03 DIAGNOSIS — E1122 Type 2 diabetes mellitus with diabetic chronic kidney disease: Secondary | ICD-10-CM | POA: Diagnosis not present

## 2024-06-08 ENCOUNTER — Telehealth: Payer: Self-pay | Admitting: Dietician

## 2024-06-08 ENCOUNTER — Ambulatory Visit: Admitting: Dietician

## 2024-06-08 DIAGNOSIS — C250 Malignant neoplasm of head of pancreas: Secondary | ICD-10-CM | POA: Diagnosis not present

## 2024-06-08 NOTE — Telephone Encounter (Signed)
 Called patient regarding her MNT appointment for today; cancelled appointment due to lack of [facility] in building as well as her receiving home care. Encouraged patient to call with any nutrition questions, or to reschedule appt if needed.

## 2024-06-09 ENCOUNTER — Other Ambulatory Visit: Payer: Self-pay

## 2024-06-10 DIAGNOSIS — E860 Dehydration: Secondary | ICD-10-CM | POA: Diagnosis not present

## 2024-06-10 DIAGNOSIS — C250 Malignant neoplasm of head of pancreas: Secondary | ICD-10-CM | POA: Diagnosis not present

## 2024-06-10 DIAGNOSIS — Z79899 Other long term (current) drug therapy: Secondary | ICD-10-CM | POA: Diagnosis not present

## 2024-06-12 NOTE — Patient Outreach (Signed)
 Complex Care Management   Visit Note    Name:  Tonya [name] MRN: [id number] DOB: 06-08-1946  Situation: Referral received for Complex Care Management related to HTN, DM and CHF I obtained verbal consent from Patient.  Visit completed with Tonya [name] via telephone.  Background:   Past Medical History:  Diagnosis Date   (HFpEF) heart failure with preserved ejection fraction (HCC)    a.) TTE 05/29/2017: EF 55-60%, mild LAE, G1DD; b.) TTE 09/07/2017: EF >55%, mild LAE, Ao sclerosis without stenosis, degen MV disease, G2DD; c.) TTE 12/02/2018: EF 55-60%, mild LVH, Mild BAE, triv TR, G1DD; d.) TTE 11/22/2019: EF 60-65%, mod LVH, mild TR, G1DD   Anemia in stage 4 chronic kidney disease (HCC)    Arthritis    Cardiomyopathy (HCC)    CKD (chronic kidney disease) stage 4, GFR 15-29 ml/min (HCC)    CVA (cerebral vascular accident) (HCC) 08/2017   a.) no residual deficits   Diabetic neuropathy (HCC)    Diverticulitis    GERD (gastroesophageal reflux disease)    History of hiatal hernia    Hyperlipidemia    Hypertension    Long term current use of antithrombotics/antiplatelets    a.) DAPT therapy (ASA + clopidogrel )   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)    Paraesophageal hernia    PHN (postherpetic neuralgia)    Sarcoidosis    Shingles    Type 2 diabetes mellitus treated with insulin  (HCC)    Wears dentures    partial upper    Assessment: Patient Reported Symptoms:  Cognitive Cognitive Status: Alert and oriented to person, place, and time, Normal speech and language skills Cognitive/Intellectual Conditions Management [RPT]: None reported or documented in medical history or problem list Health Maintenance Behaviors: Annual physical exam, Social activities, Stress management, Sleep adequate Healing Pattern: Unsure Health Facilitated by: Rest  Neurological Neurological Review of Symptoms: No symptoms reported Neurological Management Strategies:  Routine screening Neurological Self-Management Outcome: 4 (good)  HEENT HEENT Symptoms Reported: No symptoms reported HEENT Management Strategies: Medical device, Medication therapy, Routine screening, Coping strategies HEENT Self-Management Outcome: 4 (good)  Cardiovascular Cardiovascular Symptoms Reported: No symptoms reported Does patient have uncontrolled Hypertension?: No Cardiovascular Management Strategies: Medical device, Diet modification, Coping strategies, Medication therapy, Routine screening, Weight management Do You Have a Working Readable Scale?: Yes Weight: 118 lb (53.5 kg) Cardiovascular Self-Management Outcome: 4 (good)  Respiratory Respiratory Symptoms Reported: No symptoms reported  Endocrine Endocrine Symptoms Reported: No symptoms reported Is patient diabetic?: Yes Is patient checking blood sugars at home?: Yes List most recent blood sugar readings, include date and time of day: Currenlty using Freestyle Libres. Reports readings have ranged from 69 to 257 mg/dl Endocrine Self-Management Outcome: 3 (uncertain)  Gastrointestinal Gastrointestinal Symptoms Reported: No symptoms reported Gastrointestinal Management Strategies: Coping strategies, Diet modification, Medication therapy Gastrointestinal Self-Management Outcome: 3 (uncertain) Nutrition Risk Screen (CP): No indicators present  Genitourinary Genitourinary Symptoms Reported: Incontinence Genitourinary Management Strategies: Adequate rest, Coping strategies, Diet modification, Medication therapy Genitourinary Self-Management Outcome: 4 (good)  Integumentary Integumentary Symptoms Reported: No symptoms reported Skin Management Strategies: Routine screening  Musculoskeletal Musculoskelatal Symptoms Reviewed: No symptoms reported Musculoskeletal Management Strategies: Routine screening, Medical device, Medication therapy Musculoskeletal Self-Management Outcome: 4 (good)  Psychosocial Psychosocial Symptoms Reported:  No symptoms reported Behavioral Health Self-Management Outcome: 4 (good) Major Change/Loss/Stressor/Fears (CP): Medical condition, self Behaviors When Feeling Stressed/Fearful: Rest Techniques to Cope with Loss/Stress/Change: Diversional activities, None (Rest, time with family) Quality of Family Relationships: supportive Do you feel physically  threatened by others?: No      06/09/2024    5:28 PM  Depression screen PHQ 2/9  Decreased Interest 0  Down, Depressed, Hopeless 0  PHQ - 2 Score 0    Vitals:   06/09/24 1701  BP: 131/71    Medications Reviewed Today     Reviewed by Karoline Lima, RN (Registered Nurse) on 06/09/24 at 1602  Med List Status: <None>   Medication Order Taking? Sig Documenting Provider Last Dose Status Informant  acetaminophen  (TYLENOL ) 650 MG CR tablet [id number] Yes Take 650 mg by mouth every 8 (eight) hours as needed for pain. [provider]  Active   aspirin  81 MG tablet [id number] Yes Take 81 mg by mouth daily. [provider]  Active Self  carvedilol  (COREG ) 3.125 MG tablet [id number] Yes Take 1 tablet (3.125 mg total) by mouth 2 (two) times daily with a meal. PLEASE CALL [phone] TO SCHEDULE YEARLY APPOINTMENT FOR FURTHER REFILLS.  (THIRD ATTEMPT) Perla, Evalene PARAS, MD  Active   Continuous Glucose Sensor (FREESTYLE LIBRE 14 DAY SENSOR) [place] [id number]  by Does not apply route. [provider]  Active   diclofenac  Sodium (VOLTAREN ) 1 % GEL [id number] Yes Apply 4 g topically 4 (four) times daily. Thaddeus Hyla Givens, NP  Active   Insulin  Glargine [facility]) 100 UNIT/ML [id number] Yes Inject 4 Units into the skin at bedtime. [provider]  Active   insulin  lispro (HUMALOG) 100 UNIT/ML injection [id number] Yes Inject 8 Units into the skin 3 (three) times daily before meals. [provider]  Active   meclizine  (ANTIVERT ) 12.5 MG tablet [id number] Yes Take 12.5 mg by mouth 3 (three) times daily. [provider]  Active            Med Note WARDEN, PAMELA O   Thu Mar 31, 2024 10:57 AM) Taking as needed  NOVOFINE 32G X 6 MM MISC [id number]  USE AS DIRECTED THREE TIMES DAILY WITH HUMALOG Cannady, [name] T, NP  Active   olmesartan  (BENICAR ) 40 MG tablet [id number]  Take 1 tablet (40 mg total) by mouth daily.  Patient not taking: Reported on 06/09/2024   Cannady, [name] T, NP  Active   pantoprazole  (PROTONIX ) 40 MG tablet [id number] Yes Take 1 tablet (40 mg total) by mouth daily. Cannady, [name] T, NP  Active   pioglitazone  (ACTOS ) 30 MG tablet [id number] Yes Take 30 mg by mouth daily. [provider]  Active   polyethylene glycol (MIRALAX  / GLYCOLAX ) 17 g packet [id number] Yes Take 17 g by mouth 2 (two) times daily. Lane Shope, MD  Active   prochlorperazine (COMPAZINE) 10 MG tablet [id number]  Take 10 mg by mouth every 6 (six) hours as needed. [provider]  Active   rosuvastatin  (CRESTOR ) 10 MG tablet [id number] Yes Take 1 tablet (10 mg total) by mouth daily. Cannady, [name] T, NP  Active   senna (SENOKOT) 8.6 MG TABS tablet [id number] Yes Take 2 tablets by mouth daily as needed for mild constipation. [provider]  Active   simethicone  (MYLICON) 80 MG chewable tablet [id number]  Chew 1 tablet (80 mg total) by mouth 4 (four) times daily as needed for flatulence. Lane Shope, MD  Active   torsemide  (DEMADEX ) 20 MG tablet [id number] Yes TAKE 2 TABLETS(40 MG) BY MOUTH DAILY Gollan, Timothy J, MD  Active   vitamin B-12 (CYANOCOBALAMIN ) 100 MCG tablet [id number]  Take 100 mcg by mouth daily. [provider]  Active  Med Note (HOPKINS, AMY E   Tue Feb 23, 2024  1:32 PM) Hasn't refilled  Med List Note Geanie Wolm HERO, RN 04/18/20 1437): MR 06-17-2020 UDS 04-18-2020 Med agreement signed 04-18-2020            Recommendation:   Continue Current Plan of Care  Follow Up Plan:   Telephone follow up appointment with Nurse Case Manager on July 11, 2024   Jackson Acron [facility] RN Care Manager Direct Dial: [phone]  Fax: [phone] Website: [email or web address]

## 2024-06-12 NOTE — Patient Instructions (Signed)
 Thank you for allowing the Complex Care Management team to participate in your care. It was great speaking with you today!  We will follow up on July 11, 2024 at 1 pm. Please do not hesitate to contact me if you require assistance prior to our outreach.    Jackson Acron [facility] Population [facility] RN Care Manager Direct Dial: [phone]  Fax: [phone] Website: [email or web address]

## 2024-06-22 DIAGNOSIS — Z79899 Other long term (current) drug therapy: Secondary | ICD-10-CM | POA: Diagnosis not present

## 2024-06-22 DIAGNOSIS — C250 Malignant neoplasm of head of pancreas: Secondary | ICD-10-CM | POA: Diagnosis not present

## 2024-06-22 DIAGNOSIS — Z5111 Encounter for antineoplastic chemotherapy: Secondary | ICD-10-CM | POA: Diagnosis not present

## 2024-06-22 DIAGNOSIS — Z807 Family history of other malignant neoplasms of lymphoid, hematopoietic and related tissues: Secondary | ICD-10-CM | POA: Diagnosis not present

## 2024-06-24 DIAGNOSIS — E1165 Type 2 diabetes mellitus with hyperglycemia: Secondary | ICD-10-CM | POA: Diagnosis not present

## 2024-06-24 DIAGNOSIS — E1129 Type 2 diabetes mellitus with other diabetic kidney complication: Secondary | ICD-10-CM | POA: Diagnosis not present

## 2024-06-24 DIAGNOSIS — R809 Proteinuria, unspecified: Secondary | ICD-10-CM | POA: Diagnosis not present

## 2024-06-24 DIAGNOSIS — E113393 Type 2 diabetes mellitus with moderate nonproliferative diabetic retinopathy without macular edema, bilateral: Secondary | ICD-10-CM | POA: Diagnosis not present

## 2024-06-24 DIAGNOSIS — E1121 Type 2 diabetes mellitus with diabetic nephropathy: Secondary | ICD-10-CM | POA: Diagnosis not present

## 2024-06-24 DIAGNOSIS — N184 Chronic kidney disease, stage 4 (severe): Secondary | ICD-10-CM | POA: Diagnosis not present

## 2024-06-24 DIAGNOSIS — E1122 Type 2 diabetes mellitus with diabetic chronic kidney disease: Secondary | ICD-10-CM | POA: Diagnosis not present

## 2024-06-24 DIAGNOSIS — Z1331 Encounter for screening for depression: Secondary | ICD-10-CM | POA: Diagnosis not present

## 2024-06-24 DIAGNOSIS — C250 Malignant neoplasm of head of pancreas: Secondary | ICD-10-CM | POA: Diagnosis not present

## 2024-06-24 DIAGNOSIS — Z794 Long term (current) use of insulin: Secondary | ICD-10-CM | POA: Diagnosis not present

## 2024-06-25 DIAGNOSIS — C250 Malignant neoplasm of head of pancreas: Secondary | ICD-10-CM | POA: Diagnosis not present

## 2024-06-25 DIAGNOSIS — Z79899 Other long term (current) drug therapy: Secondary | ICD-10-CM | POA: Diagnosis not present

## 2024-06-29 ENCOUNTER — Inpatient Hospital Stay

## 2024-06-29 ENCOUNTER — Encounter: Payer: Self-pay | Admitting: Oncology

## 2024-06-29 ENCOUNTER — Inpatient Hospital Stay (HOSPITAL_BASED_OUTPATIENT_CLINIC_OR_DEPARTMENT_OTHER): Admitting: Oncology

## 2024-06-29 ENCOUNTER — Inpatient Hospital Stay: Attending: Oncology

## 2024-06-29 VITALS — BP 119/78 | HR 91 | Temp 95.7°F | Resp 18 | Wt 115.6 lb

## 2024-06-29 DIAGNOSIS — D638 Anemia in other chronic diseases classified elsewhere: Secondary | ICD-10-CM

## 2024-06-29 DIAGNOSIS — C250 Malignant neoplasm of head of pancreas: Secondary | ICD-10-CM | POA: Insufficient documentation

## 2024-06-29 DIAGNOSIS — D631 Anemia in chronic kidney disease: Secondary | ICD-10-CM | POA: Diagnosis not present

## 2024-06-29 DIAGNOSIS — N184 Chronic kidney disease, stage 4 (severe): Secondary | ICD-10-CM | POA: Diagnosis not present

## 2024-06-29 LAB — CBC WITH DIFFERENTIAL (CANCER CENTER ONLY)
Abs Immature Granulocytes: 0.08 K/uL — ABNORMAL HIGH (ref 0.00–0.07)
Basophils Absolute: 0.1 K/uL (ref 0.0–0.1)
Basophils Relative: 1 %
Eosinophils Absolute: 0 K/uL (ref 0.0–0.5)
Eosinophils Relative: 0 %
HCT: 24.4 % — ABNORMAL LOW (ref 36.0–46.0)
Hemoglobin: 7.6 g/dL — ABNORMAL LOW (ref 12.0–15.0)
Immature Granulocytes: 2 %
Lymphocytes Relative: 15 %
Lymphs Abs: 0.8 K/uL (ref 0.7–4.0)
MCH: 27.1 pg (ref 26.0–34.0)
MCHC: 31.1 g/dL (ref 30.0–36.0)
MCV: 87.1 fL (ref 80.0–100.0)
Monocytes Absolute: 0.5 K/uL (ref 0.1–1.0)
Monocytes Relative: 9 %
Neutro Abs: 4 K/uL (ref 1.7–7.7)
Neutrophils Relative %: 73 %
Platelet Count: 188 K/uL (ref 150–400)
RBC: 2.8 MIL/uL — ABNORMAL LOW (ref 3.87–5.11)
RDW: 13.7 % (ref 11.5–15.5)
Smear Review: NORMAL
WBC Count: 5.4 K/uL (ref 4.0–10.5)
nRBC: 0 % (ref 0.0–0.2)

## 2024-06-29 LAB — FERRITIN: Ferritin: 594 ng/mL — ABNORMAL HIGH (ref 11–307)

## 2024-06-29 LAB — IRON AND TIBC
Iron: 85 ug/dL (ref 28–170)
Saturation Ratios: 37 % — ABNORMAL HIGH (ref 10.4–31.8)
TIBC: 230 ug/dL — ABNORMAL LOW (ref 250–450)
UIBC: 145 ug/dL

## 2024-06-29 LAB — RETIC PANEL
Immature Retic Fract: 0 % — ABNORMAL LOW (ref 2.3–15.9)
RBC.: 2.82 MIL/uL — ABNORMAL LOW (ref 3.87–5.11)
Retic Ct Pct: <0.4 % — ABNORMAL LOW (ref 0.4–3.1)
Reticulocyte Hemoglobin: 29.6 pg (ref >27.9)

## 2024-06-29 MED ORDER — EPOETIN ALFA-EPBX 10000 UNIT/ML IJ SOLN: 10000.0000 [IU] | Freq: Once | INTRAMUSCULAR | Status: DC

## 2024-06-29 NOTE — Progress Notes (Signed)
 Hematology/Oncology Progress note [phone] [phone]     CHIEF COMPLAINTS/REASON FOR VISIT:  Follow-up for anemia due to chronic kidney disease   ASSESSMENT & PLAN:   Anemia of chronic disease History of iron  deficiency anemia,  anemia secondary to chronic kidney disease. Labs reviewed and discussed patient. Lab Results  Component Value Date   HGB 7.6 (L) 06/29/2024   TIBC 298 02/24/2024   IRONPCTSAT 19 02/24/2024   FERRITIN 296 02/24/2024   Hemoglobin has significant decreased to 7.6.  Likely secondary to recent chemotherapy.  Given that Tonya [name] is on active cancer treatments I will hold off erythropoietin therapy.  I will defer to her oncologist to manage anemia.  I encouraged patient to follow-up closely with [facility] oncology.  Tonya [name] may need blood transfusion in order to stabilize hemoglobin while on chemotherapy.  Follow-up as needed. All questions were answered. The patient knows to call the clinic with any problems, questions or concerns.  Zelphia Cap, MD, PhD [facility] Hematology Oncology 06/29/2024     HISTORY OF PRESENTING ILLNESS:  Tonya [name] is a  78 y.o.  female with PMH listed below who was referred to me for evaluation of anemia Reviewed patient's recent labs that was done at [facility] office. Labs revealed anemia with hemoglobin of 8.9, MCV 82, normal platelet and wbc  Reviewed patient's previous labs ordered by primary care physician's office, anemia is chronic onset , duration is since 2017 No aggravating or improving factors.  Associated signs and symptoms: Patient reports fatigue.  SOB with exertion.  Denies weight loss, easy bruising, hematochezia, hemoptysis, hematuria. Context: History of GI bleeding: denies               History of Chronic kidney disease: CKD, follows up with Dr.Kolluru.                History of autoimmune disease: denies               History of hemolytic anemia. denies               Last colonoscopy: underwent  colonoscopy on 06/06/2019.  Due to poor preparation, patient was scheduled to repeat colonoscopy due to suboptimal bowel preparation.  Patient told me that Tonya [name] is waiting for insurance approval for repeat procedure.  Previously tolerated IV Venofer  treatments very well.  acute drop of hemoglobin to 5.9, status post PRBC transfusion at [facility].  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  Tonya [name] was discharged with PPI for 6 weeks. Hemoglobin at discharge on 12/10/2019 was 8.6.  INTERVAL HISTORY Tonya [name] is a 78 y.o. female who has above history reviewed by me today presents to reestablish care for chronic anemia. 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  Patient has started on neoadjuvant chemotherapy FOLFIRINOX last week. Patient was previously on erythropoietin replacement therapy and last dosage was in April 2025.  Tonya [name] missed her injections due to hospitalization and treatments. Tonya [name] feels tired.  No shortness of breath, chest pain lightheadedness.  Decreased appetite. Review of Systems  Constitutional:  Positive for fatigue. Negative for appetite change, chills and fever.  HENT:   Negative for hearing loss and voice change.   Eyes:  Negative for eye problems.  Respiratory:  Negative for chest tightness and cough.   Cardiovascular:  Negative for chest pain.  Gastrointestinal:  Negative for abdominal distention, abdominal pain and blood in stool.  Endocrine: Negative for hot flashes.  Genitourinary:  Negative for difficulty urinating and frequency.   Musculoskeletal:  Negative for arthralgias.  Skin:  Negative for itching and rash.  Neurological:  Negative for extremity weakness.  Hematological:  Negative for adenopathy.  Psychiatric/Behavioral:  Negative for confusion.     MEDICAL HISTORY:   Past Medical History:  Diagnosis Date   (HFpEF) heart failure with preserved ejection fraction (HCC)    a.) TTE 05/29/2017: EF 55-60%, mild LAE, G1DD; b.) TTE 09/07/2017: EF >55%, mild LAE, Ao sclerosis without stenosis, degen MV disease, G2DD; c.) TTE 12/02/2018: EF 55-60%, mild LVH, Mild BAE, triv TR, G1DD; d.) TTE 11/22/2019: EF 60-65%, mod LVH, mild TR, G1DD   Anemia in stage 4 chronic kidney disease (HCC)    Arthritis    Cardiomyopathy (HCC)    CKD (chronic kidney disease) stage 4, GFR 15-29 ml/min (HCC)    CVA (cerebral vascular accident) (HCC) 08/2017   a.) no residual deficits   Diabetic neuropathy (HCC)    Diverticulitis    GERD (gastroesophageal reflux disease)    History of hiatal hernia    Hyperlipidemia    Hypertension    Long term current use of antithrombotics/antiplatelets    a.) DAPT therapy (ASA + clopidogrel )   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)    Paraesophageal hernia    PHN (postherpetic neuralgia)    Sarcoidosis    Shingles    Type 2 diabetes mellitus treated with insulin  (HCC)    Wears dentures    partial upper    SURGICAL HISTORY: Past Surgical History:  Procedure Laterality Date   CATARACT EXTRACTION W/PHACO Left 02/05/2021   Procedure: CATARACT EXTRACTION PHACO AND INTRAOCULAR LENS PLACEMENT (IOC) LEFT DIABETIC 5.47 00:51.4;  Surgeon: Jaye Fallow, MD;  Location: [facility];  Service: Ophthalmology;  Laterality: Left;  Diabetic - insulin  and oral meds   CATARACT EXTRACTION W/PHACO Right 03/19/2021   Procedure: CATARACT EXTRACTION PHACO AND INTRAOCULAR LENS PLACEMENT (IOC) RIGHT DIABETIC 6.09 00:39.6;  Surgeon: Jaye Fallow, MD;  Location: [facility];  Service: Ophthalmology;  Laterality: Right;   COLONOSCOPY  12/15/2006   COLONOSCOPY WITH PROPOFOL  N/A 06/06/2019   Procedure: COLONOSCOPY WITH PROPOFOL ;  Surgeon: Therisa Bi, MD;  Location: [facility];  Service: Gastroenterology;   Laterality: N/A;   COLONOSCOPY WITH PROPOFOL  N/A 01/16/2020   Procedure: COLONOSCOPY WITH BIOPSY;  Surgeon: Therisa Bi, MD;  Location: [facility];  Service: Endoscopy;  Laterality: N/A;  Diabetic - insulin  and oral meds   ESOPHAGOGASTRODUODENOSCOPY (EGD) WITH PROPOFOL  N/A 06/23/2022   Procedure: ESOPHAGOGASTRODUODENOSCOPY (EGD) WITH PROPOFOL ;  Surgeon: Therisa Bi, MD;  Location: [facility];  Service: Gastroenterology;  Laterality: N/A;   EYE SURGERY     INSERTION OF MESH  08/26/2022   Procedure: INSERTION OF MESH;  Surgeon: Jordis Laneta FALCON, MD;  Location: [facility];  Service: General;;   IR RADIOLOGIST EVAL & MGMT  08/18/2023   IR RADIOLOGIST EVAL & MGMT  09/09/2023   POLYPECTOMY N/A 01/16/2020   Procedure: POLYPECTOMY;  Surgeon: Therisa Bi, MD;  Location: [facility];  Service: Endoscopy;  Laterality: N/A;   TUBAL LIGATION     XI ROBOTIC ASSISTED PARAESOPHAGEAL HERNIA REPAIR N/A 08/26/2022   Procedure: XI ROBOTIC ASSISTED PARAESOPHAGEAL HERNIA REPAIR, RNFA to assist;  Surgeon: Jordis Laneta FALCON, MD;  Location: [facility];  Service: General;  Laterality: N/A;    SOCIAL HISTORY: Social History  Socioeconomic History   Marital status: Married    Spouse name: Joe   Number of children: 4   Years of education: Not on file   Highest education level: GED or equivalent  Occupational History   Occupation: retired  Tobacco Use   Smoking status: Never    Passive exposure: Never   Smokeless tobacco: Never  Vaping Use   Vaping status: Never Used  Substance and Sexual Activity   Alcohol use: No    Alcohol/week: 0.0 standard drinks of alcohol   Drug use: No   Sexual activity: Not Currently  Other Topics Concern   Not on file  Social History Narrative   Grandson lives with them.   Social Drivers of Corporate investment banker Strain: Low Risk  (06/09/2024)   Overall Financial Resource Strain (CARDIA)    Difficulty of Paying Living Expenses: Not very hard  Food Insecurity:  No Food Insecurity (06/09/2024)   Hunger Vital Sign    Worried About Running Out of Food in the Last Year: Never true    Ran Out of Food in the Last Year: Never true  Transportation Needs: No Transportation Needs (06/09/2024)   PRAPARE - Administrator, Civil Service (Medical): No    Lack of Transportation (Non-Medical): No  Physical Activity: Inactive (02/23/2024)   Exercise Vital Sign    Days of Exercise per Week: 0 days    Minutes of Exercise per Session: 0 min  Stress: No Stress Concern Present (06/09/2024)   [employer] of Occupational Health - Occupational Stress Questionnaire    Feeling of Stress: Only a little  Social Connections: Unknown (06/09/2024)   Social Connection and Isolation Panel    Frequency of Communication with Friends and Family: More than three times a week    Frequency of Social Gatherings with Friends and Family: More than three times a week    Attends Religious Services: More than 4 times per year    Active Member of [employer] or Organizations: Yes    Attends Banker Meetings: More than 4 times per year    Marital Status: Not on file  Intimate Partner Violence: Not At Risk (06/09/2024)   Humiliation, Afraid, Rape, and Kick questionnaire    Fear of Current or Ex-Partner: No    Emotionally Abused: No    Physically Abused: No    Sexually Abused: No    FAMILY HISTORY: Family History  Problem Relation Age of Onset   Diabetes Mother    Stroke Mother    Hyperlipidemia Father    Hypertension Father    Diabetes Sister    Diabetes Brother    Gout Son    Diabetes Brother    Kidney disease Son    Lymphoma Sister    Heart attack Sister    Breast cancer Neg Hx     ALLERGIES:  is allergic to atorvastatin , gabapentin , other, pravastatin , pregabalin , and trulicity [dulaglutide].  MEDICATIONS:  Current Outpatient Medications  Medication Sig Dispense Refill   acetaminophen  (TYLENOL ) 650 MG CR tablet Take 650 mg by mouth every 8 (eight)  hours as needed for pain.     aspirin  81 MG tablet Take 81 mg by mouth daily.     carvedilol  (COREG ) 3.125 MG tablet Take 1 tablet (3.125 mg total) by mouth 2 (two) times daily with a meal. PLEASE CALL [phone] TO SCHEDULE YEARLY APPOINTMENT FOR FURTHER REFILLS.  (THIRD ATTEMPT) 30 tablet 0   Continuous Glucose Sensor (FREESTYLE LIBRE 14 DAY SENSOR)  MISC by Does not apply route.     diclofenac  Sodium (VOLTAREN ) 1 % GEL Apply 4 g topically 4 (four) times daily. 4 g 1   Insulin  Glargine (BASAGLAR KWIKPEN) 100 UNIT/ML Inject 4 Units into the skin at bedtime.     insulin  lispro (HUMALOG) 100 UNIT/ML injection Inject 8 Units into the skin 3 (three) times daily before meals.     meclizine  (ANTIVERT ) 12.5 MG tablet Take 12.5 mg by mouth 3 (three) times daily.     NOVOFINE 32G X 6 MM MISC USE AS DIRECTED THREE TIMES DAILY WITH HUMALOG 100 each 3   pantoprazole  (PROTONIX ) 40 MG tablet Take 1 tablet (40 mg total) by mouth daily. 90 tablet 4   pioglitazone  (ACTOS ) 30 MG tablet Take 30 mg by mouth daily.     polyethylene glycol (MIRALAX  / GLYCOLAX ) 17 g packet Take 17 g by mouth 2 (two) times daily. 14 each 0   prochlorperazine (COMPAZINE) 10 MG tablet Take 10 mg by mouth every 6 (six) hours as needed.     rosuvastatin  (CRESTOR ) 10 MG tablet Take 1 tablet (10 mg total) by mouth daily. 90 tablet 4   senna (SENOKOT) 8.6 MG TABS tablet Take 2 tablets by mouth daily as needed for mild constipation.     simethicone  (MYLICON) 80 MG chewable tablet Chew 1 tablet (80 mg total) by mouth 4 (four) times daily as needed for flatulence. 30 tablet 0   torsemide  (DEMADEX ) 20 MG tablet TAKE 2 TABLETS(40 MG) BY MOUTH DAILY 60 tablet 0   vitamin B-12 (CYANOCOBALAMIN ) 100 MCG tablet Take 100 mcg by mouth daily.     olmesartan  (BENICAR ) 40 MG tablet Take 1 tablet (40 mg total) by mouth daily. (Patient not taking: Reported on 06/29/2024) 90 tablet 4   No current facility-administered medications for this visit.      PHYSICAL EXAMINATION: ECOG PERFORMANCE STATUS: 1 - Symptomatic but completely ambulatory Vitals:   06/29/24 1315  BP: 119/78  Pulse: 91  Resp: 18  Temp: (!) 95.7 F (35.4 C)  SpO2: 100%   Filed Weights   06/29/24 1315  Weight: 115 lb 9.6 oz (52.4 kg)    Physical Exam Constitutional:      General: Tonya [name] is not in acute distress. HENT:     Head: Normocephalic and atraumatic.  Eyes:     General: No scleral icterus.    Pupils: Pupils are equal, round, and reactive to light.  Cardiovascular:     Rate and Rhythm: Normal rate.  Pulmonary:     Effort: Pulmonary effort is normal. No respiratory distress.     Breath sounds: No wheezing.  Abdominal:     General: Bowel sounds are normal. There is no distension.     Palpations: Abdomen is soft.  Musculoskeletal:        General: No deformity. Normal range of motion.     Cervical back: Normal range of motion and neck supple.  Skin:    General: Skin is warm and dry.     Findings: No erythema or rash.  Neurological:     Mental Status: Tonya [name] is alert and oriented to person, place, and time.     Cranial Nerves: No cranial nerve deficit.     Coordination: Coordination normal.  Psychiatric:        Mood and Affect: Mood normal.      LABORATORY DATA:  I have reviewed the data as listed    Latest Ref Rng & Units 06/29/2024   12:53  PM 04/06/2024    1:19 PM 02/24/2024   12:53 PM  CBC  WBC 4.0 - 10.5 K/uL 5.4   7.4   Hemoglobin 12.0 - 15.0 g/dL 7.6  9.6  9.6   Hematocrit 36.0 - 46.0 % 24.4  30.7  30.6   Platelets 150 - 400 K/uL 188   291       Latest Ref Rng & Units 08/05/2023    4:46 AM 08/04/2023    5:45 AM 08/03/2023    4:54 AM  CMP  Glucose 70 - 99 mg/dL 891  871  899   BUN 8 - 23 mg/dL 34  43  50   Creatinine 0.44 - 1.00 mg/dL 7.74  7.48  7.28   Sodium 135 - 145 mmol/L 140  140  140   Potassium 3.5 - 5.1 mmol/L 4.4  4.5  4.1   Chloride 98 - 111 mmol/L 114  110  110   CO2 22 - 32 mmol/L 21  21  24    Calcium  8.9 - 10.3  mg/dL 8.0  8.1  7.9    Lab Results  Component Value Date   IRON  85 06/29/2024   TIBC 230 (L) 06/29/2024   FERRITIN 594 (H) 06/29/2024

## 2024-06-29 NOTE — Progress Notes (Signed)
 Not given per MD

## 2024-06-29 NOTE — Assessment & Plan Note (Addendum)
 History of iron  deficiency anemia,  anemia secondary to chronic kidney disease. Labs reviewed and discussed patient. Lab Results  Component Value Date   HGB 7.6 (L) 06/29/2024   TIBC 298 02/24/2024   IRONPCTSAT 19 02/24/2024   FERRITIN 296 02/24/2024   Hemoglobin has significant decreased to 7.6.  Likely secondary to recent chemotherapy.  Given that she is on active cancer treatments I will hold off erythropoietin therapy.  I will defer to her oncologist to manage anemia.  I encouraged patient to follow-up closely with [facility] oncology.  She may need blood transfusion in order to stabilize hemoglobin while on chemotherapy.

## 2024-07-01 DIAGNOSIS — Z79899 Other long term (current) drug therapy: Secondary | ICD-10-CM | POA: Diagnosis not present

## 2024-07-01 DIAGNOSIS — I10 Essential (primary) hypertension: Secondary | ICD-10-CM | POA: Diagnosis not present

## 2024-07-01 DIAGNOSIS — C250 Malignant neoplasm of head of pancreas: Secondary | ICD-10-CM | POA: Diagnosis not present

## 2024-07-04 ENCOUNTER — Other Ambulatory Visit: Payer: Self-pay

## 2024-07-04 NOTE — Progress Notes (Unsigned)
   07/04/2024  Patient ID: Tonya [name], female   DOB: 1946/10/21, 78 y.o.   MRN: [id number]  Patient outreach attempt to follow-up on management of diabetes was unsuccessful.  HIPAA compliant voicemail left with my direct phone number, and I am sending a MyChart message to attempt to schedule a telephone follow-up visit.  [name], PharmD, DPLA

## 2024-07-06 DIAGNOSIS — E0822 Diabetes mellitus due to underlying condition with diabetic chronic kidney disease: Secondary | ICD-10-CM | POA: Diagnosis not present

## 2024-07-06 DIAGNOSIS — T451X5A Adverse effect of antineoplastic and immunosuppressive drugs, initial encounter: Secondary | ICD-10-CM | POA: Diagnosis not present

## 2024-07-06 DIAGNOSIS — Z79899 Other long term (current) drug therapy: Secondary | ICD-10-CM | POA: Diagnosis not present

## 2024-07-06 DIAGNOSIS — K521 Toxic gastroenteritis and colitis: Secondary | ICD-10-CM | POA: Diagnosis not present

## 2024-07-06 DIAGNOSIS — C250 Malignant neoplasm of head of pancreas: Secondary | ICD-10-CM | POA: Diagnosis not present

## 2024-07-06 DIAGNOSIS — N1831 Chronic kidney disease, stage 3a: Secondary | ICD-10-CM | POA: Diagnosis not present

## 2024-07-07 DIAGNOSIS — C250 Malignant neoplasm of head of pancreas: Secondary | ICD-10-CM | POA: Diagnosis not present

## 2024-07-07 DIAGNOSIS — Z5111 Encounter for antineoplastic chemotherapy: Secondary | ICD-10-CM | POA: Diagnosis not present

## 2024-07-07 DIAGNOSIS — Z79899 Other long term (current) drug therapy: Secondary | ICD-10-CM | POA: Diagnosis not present

## 2024-07-08 ENCOUNTER — Telehealth: Payer: Self-pay | Admitting: Pharmacy Technician

## 2024-07-08 NOTE — Progress Notes (Signed)
   07/08/2024 Name: Tonya [name] MRN: [id number] DOB: 26-Dec-1945  Patient is appearing on a report for [facility] [place] [facility] Diabetes and last engaged with the clinical pharmacist to discuss diabetes on 03/22/2024. Contacted patient today to discuss diabetes management and completed medication review.   Diabetes Plan from last clinical pharmacist appointment: Patient outreach to follow-up on Farxiga 10mg  daily. Patient had stopped taking medication, but nephrology advised patient resume therapy; and they worked with patient to apply for AZ&Me PAP. Per patient, medication has been received from program; and she has resumed therapy. Patient is followed by Endocrinology for management of DM, and last A1c was elevated at 8.8% in November. Will review chart for updated value in 2-3 months, as this will reflect effect of [name] being back on board.    Medication Adherence Barriers Identified:  Patient made recommended medication changes per plan: Yes Patient has seen Endocrinology since her last visit with PharmD. She saw Endocrinology on 06/24/24. She informs they wrote out a detailed plan of how patient is to use Basaglar and Humalog based on when patient is on steroids/chemotherapy. Patient endorses understanding this regimen and informs she has it written out in big letters for her to read. She informs she is also taking Actos  30mg  daily. She informs Nephrology stopped Farxiga about 3 months ago. Access issues with any new medication or testing device: No Per Dr Annemarie, last picked up 100 day supply of Basaglar on 05/26/24, No data available for Humalog or Actos  though patient endorses having a supply of these at home.  Patient is checking blood sugars as prescribed: Yes Patient uses Freestyle CGM to check blood sugars. Patient reports the following blood sugars over the past 24 hours, 251, 252, 256, 252 and then over night it dropped to 68,68,69,68,69,57,253,252 and right now it is 189.She informs when it  notifies her that it is low she will eat a peanut butter and jelly sandwich.  Educated patient to contact pharmacy regarding new prescriptions/refills  Reviewed instructions for monitoring blood sugars at home and reminded patient to keep a written log to review with pharmacist Reminded patient of date/time of upcoming clinical pharmacist follow up and any upcoming PCP/specialists visits. Patient denies transportation barriers to the appointment. Yes  Next clinical pharmacist appointment is scheduled for: TBD  Kate Caddy, CPhT [facility] Office: [phone] Email: [email or web address]

## 2024-07-11 ENCOUNTER — Telehealth: Payer: Self-pay

## 2024-07-11 DIAGNOSIS — Z79899 Other long term (current) drug therapy: Secondary | ICD-10-CM | POA: Diagnosis not present

## 2024-07-11 DIAGNOSIS — C250 Malignant neoplasm of head of pancreas: Secondary | ICD-10-CM | POA: Diagnosis not present

## 2024-07-11 NOTE — Patient Instructions (Signed)
 Tonya [name] - I am sorry I was unable to reach you today for our scheduled appointment. I work with [name], NP and am calling to support your healthcare needs. Please contact me at [phone] at your earliest convenience. I look forward to speaking with you soon.   Thank you,   Jackson Acron [facility] RN Care Manager Direct Dial: [phone]  Fax: [phone] Website: [email or web address]

## 2024-07-12 DIAGNOSIS — I129 Hypertensive chronic kidney disease with stage 1 through stage 4 chronic kidney disease, or unspecified chronic kidney disease: Secondary | ICD-10-CM | POA: Diagnosis not present

## 2024-07-12 DIAGNOSIS — R809 Proteinuria, unspecified: Secondary | ICD-10-CM | POA: Diagnosis not present

## 2024-07-12 DIAGNOSIS — E1122 Type 2 diabetes mellitus with diabetic chronic kidney disease: Secondary | ICD-10-CM | POA: Diagnosis not present

## 2024-07-12 DIAGNOSIS — D631 Anemia in chronic kidney disease: Secondary | ICD-10-CM | POA: Diagnosis not present

## 2024-07-12 DIAGNOSIS — N184 Chronic kidney disease, stage 4 (severe): Secondary | ICD-10-CM | POA: Diagnosis not present

## 2024-07-12 DIAGNOSIS — N2581 Secondary hyperparathyroidism of renal origin: Secondary | ICD-10-CM | POA: Diagnosis not present

## 2024-07-12 DIAGNOSIS — E875 Hyperkalemia: Secondary | ICD-10-CM | POA: Diagnosis not present

## 2024-07-15 NOTE — Patient Instructions (Incomplete)

## 2024-07-19 ENCOUNTER — Ambulatory Visit: Payer: Medicare Other | Admitting: Nurse Practitioner

## 2024-07-19 DIAGNOSIS — C250 Malignant neoplasm of head of pancreas: Secondary | ICD-10-CM | POA: Diagnosis not present

## 2024-07-19 DIAGNOSIS — R197 Diarrhea, unspecified: Secondary | ICD-10-CM | POA: Diagnosis not present

## 2024-07-19 DIAGNOSIS — E119 Type 2 diabetes mellitus without complications: Secondary | ICD-10-CM

## 2024-07-19 DIAGNOSIS — D638 Anemia in other chronic diseases classified elsewhere: Secondary | ICD-10-CM

## 2024-07-19 DIAGNOSIS — E876 Hypokalemia: Secondary | ICD-10-CM | POA: Diagnosis not present

## 2024-07-19 DIAGNOSIS — I5032 Chronic diastolic (congestive) heart failure: Secondary | ICD-10-CM

## 2024-07-19 DIAGNOSIS — Z79899 Other long term (current) drug therapy: Secondary | ICD-10-CM | POA: Diagnosis not present

## 2024-07-19 DIAGNOSIS — E1140 Type 2 diabetes mellitus with diabetic neuropathy, unspecified: Secondary | ICD-10-CM

## 2024-07-19 DIAGNOSIS — Z5111 Encounter for antineoplastic chemotherapy: Secondary | ICD-10-CM | POA: Diagnosis not present

## 2024-07-19 DIAGNOSIS — N2581 Secondary hyperparathyroidism of renal origin: Secondary | ICD-10-CM

## 2024-07-19 DIAGNOSIS — I130 Hypertensive heart and chronic kidney disease with heart failure and stage 1 through stage 4 chronic kidney disease, or unspecified chronic kidney disease: Secondary | ICD-10-CM

## 2024-07-19 DIAGNOSIS — N184 Chronic kidney disease, stage 4 (severe): Secondary | ICD-10-CM

## 2024-07-19 DIAGNOSIS — Z8673 Personal history of transient ischemic attack (TIA), and cerebral infarction without residual deficits: Secondary | ICD-10-CM

## 2024-07-19 DIAGNOSIS — E1169 Type 2 diabetes mellitus with other specified complication: Secondary | ICD-10-CM

## 2024-07-21 ENCOUNTER — Telehealth: Payer: Self-pay

## 2024-07-21 NOTE — Progress Notes (Signed)
   07/21/2024  Patient ID: Blima FORBES Sharps, female   DOB: 11-17-1946, 78 y.o.   MRN: [id number]  Subjective/objective Telephone outreach to follow-up on management of diabetes   Diabetes -Current medications:  Basaglar 4 units at bedtime, Humalog 8 units TID before meals, pioglitazone  30mg  daily -Farxiga recently stopped by nephrology -Patient using Freestyle Libre for CGM -Endorses FBG usually 140-170, but she did recently have one FBG in the 50's and had to get something to eat quickly to bring back to normal range.  States she did have Glucerna shake the night before and did administer her usually 4 units of Basaglar at bedtime -Last A1c was 11.9% on 6/1  Assessment/plan   Diabetes -Continue current regimen at this time -Continue to use [facility] for CGM -Patient sees endocrinology again 8/12 -Due for A1c again 9/1   Follow-up: 2 weeks   [name], PharmD, DPLA

## 2024-07-26 DIAGNOSIS — E1165 Type 2 diabetes mellitus with hyperglycemia: Secondary | ICD-10-CM | POA: Diagnosis not present

## 2024-07-26 DIAGNOSIS — E1122 Type 2 diabetes mellitus with diabetic chronic kidney disease: Secondary | ICD-10-CM | POA: Diagnosis not present

## 2024-07-26 DIAGNOSIS — N184 Chronic kidney disease, stage 4 (severe): Secondary | ICD-10-CM | POA: Diagnosis not present

## 2024-07-26 DIAGNOSIS — Z794 Long term (current) use of insulin: Secondary | ICD-10-CM | POA: Diagnosis not present

## 2024-07-26 DIAGNOSIS — E113393 Type 2 diabetes mellitus with moderate nonproliferative diabetic retinopathy without macular edema, bilateral: Secondary | ICD-10-CM | POA: Diagnosis not present

## 2024-07-26 DIAGNOSIS — E1121 Type 2 diabetes mellitus with diabetic nephropathy: Secondary | ICD-10-CM | POA: Diagnosis not present

## 2024-07-26 DIAGNOSIS — R809 Proteinuria, unspecified: Secondary | ICD-10-CM | POA: Diagnosis not present

## 2024-07-26 DIAGNOSIS — E1129 Type 2 diabetes mellitus with other diabetic kidney complication: Secondary | ICD-10-CM | POA: Diagnosis not present

## 2024-07-28 DIAGNOSIS — N1831 Chronic kidney disease, stage 3a: Secondary | ICD-10-CM | POA: Diagnosis not present

## 2024-07-28 DIAGNOSIS — Z5111 Encounter for antineoplastic chemotherapy: Secondary | ICD-10-CM | POA: Diagnosis not present

## 2024-07-28 DIAGNOSIS — Z79899 Other long term (current) drug therapy: Secondary | ICD-10-CM | POA: Diagnosis not present

## 2024-07-28 DIAGNOSIS — C250 Malignant neoplasm of head of pancreas: Secondary | ICD-10-CM | POA: Diagnosis not present

## 2024-07-28 DIAGNOSIS — E0822 Diabetes mellitus due to underlying condition with diabetic chronic kidney disease: Secondary | ICD-10-CM | POA: Diagnosis not present

## 2024-08-01 DIAGNOSIS — C250 Malignant neoplasm of head of pancreas: Secondary | ICD-10-CM | POA: Diagnosis not present

## 2024-08-01 DIAGNOSIS — Z79899 Other long term (current) drug therapy: Secondary | ICD-10-CM | POA: Diagnosis not present

## 2024-08-03 DIAGNOSIS — C250 Malignant neoplasm of head of pancreas: Secondary | ICD-10-CM | POA: Diagnosis not present

## 2024-08-03 DIAGNOSIS — K521 Toxic gastroenteritis and colitis: Secondary | ICD-10-CM | POA: Diagnosis not present

## 2024-08-03 DIAGNOSIS — T451X5A Adverse effect of antineoplastic and immunosuppressive drugs, initial encounter: Secondary | ICD-10-CM | POA: Diagnosis not present

## 2024-08-03 NOTE — Progress Notes (Unsigned)
   08/03/2024  Patient ID: Tonya [name], female   DOB: Mar 12, 1946, 78 y.o.   MRN: [id number]  Subjective/objective Telephone outreach to follow-up on management of diabetes   Diabetes -Current medications:  Basaglar 4 units at bedtime, Humalog 8 units TID before meals, pioglitazone  30mg  daily -Farxiga recently stopped by nephrology -Patient using Freestyle Libre for CGM -Endorses FBG usually 140-170, but she did recently have one FBG in the 50's and had to get something to eat quickly to bring back to normal range.  States she did have Glucerna shake the night before and did administer her usually 4 units of Basaglar at bedtime -Last A1c was 11.9% on 6/1  Assessment/plan   Diabetes -Continue current regimen at this time -Continue to use [facility] for CGM -Patient sees endocrinology again 8/12 -Due for A1c again 9/1   Follow-up: 2 weeks   [name], PharmD, DPLA

## 2024-08-04 ENCOUNTER — Other Ambulatory Visit: Payer: Self-pay

## 2024-08-04 DIAGNOSIS — E1140 Type 2 diabetes mellitus with diabetic neuropathy, unspecified: Secondary | ICD-10-CM

## 2024-08-04 NOTE — Progress Notes (Signed)
   08/04/2024  Patient ID: Blima FORBES Sharps, female   DOB: [birth year], 78 y.o.   MRN: [id number]  Subjective/objective Telephone outreach to follow-up on management of diabetes   Diabetes -Current medications:  Basaglar 4 units at bedtime, Humalog 8 units TID before meals, pioglitazone  30mg  daily -Farxiga recently stopped by nephrology -Patient using Freestyle Libre for CGM -Endorses FBG usually 140-170; BG during phone call was 185, and this was approximately 3 hours after lunch -Does endorse FBG in the 60's, but this came up after having a Glucerna shake -Patient is followed by Dr. Damian with [facility]) -Last A1c was 11.9% on 6/1  Hypertension -Current medications:  carvedilol  3.125mg  BID, olmesartan  40mg  daily, torsemide  20mg  daily -Patient has only been taking carvedilol  once daily; was not aware this was to be taken BID -Endorses new swelling in both feet/ankles.  Does not endorse any SOB or chest pain.  Is taking torsemide  20mg  daily. -Does not monitor home BP but states BP always normal at office visits and chemo treatments -Last documented office BP of 119/78 on 7/16  Assessment/plan   Diabetes -Continue current regimen at this time -Continue to use Freestyle Libre for CGM -Patient sees endocrinology again 9/23 and will be due for A1c  Hypertension -Begin taking carvedilol  3.125mg  BID, continue olmesartan  40mg  daily and torsemide  20mg  daily -Patient sees PCP again in November, but appears to have missed a visit earlier this month- will notify about swelling in feet/ankles to see if they would like to see patient sooner   Follow-up: 4 weeks   [name], PharmD, DPLA

## 2024-08-11 ENCOUNTER — Encounter: Payer: Self-pay | Admitting: Dietician

## 2024-08-11 DIAGNOSIS — C250 Malignant neoplasm of head of pancreas: Secondary | ICD-10-CM | POA: Diagnosis not present

## 2024-08-11 DIAGNOSIS — E1122 Type 2 diabetes mellitus with diabetic chronic kidney disease: Secondary | ICD-10-CM | POA: Diagnosis not present

## 2024-08-11 DIAGNOSIS — N1831 Chronic kidney disease, stage 3a: Secondary | ICD-10-CM | POA: Diagnosis not present

## 2024-08-11 DIAGNOSIS — Z5111 Encounter for antineoplastic chemotherapy: Secondary | ICD-10-CM | POA: Diagnosis not present

## 2024-08-11 DIAGNOSIS — Z79899 Other long term (current) drug therapy: Secondary | ICD-10-CM | POA: Diagnosis not present

## 2024-08-11 NOTE — Progress Notes (Signed)
 Patient's visit for 6.25.25 was cancelled as she is now receiving home health care. Sent notification to referring provider.

## 2024-08-12 ENCOUNTER — Other Ambulatory Visit: Payer: Self-pay

## 2024-08-12 NOTE — Patient Outreach (Signed)
 Complex Care Management   Visit Note  08/12/2024  Name:  Tonya [name] MRN: [id number] DOB: 04-03-1946  Situation: Referral received for Complex Care Management related to Heart Failure, Diabetic and Hypertension. I obtained verbal consent from Patient.  Visit completed with Tonya [name] via telephone.  Background:   Past Medical History:  Diagnosis Date   (HFpEF) heart failure with preserved ejection fraction (HCC)    a.) TTE 05/29/2017: EF 55-60%, mild LAE, G1DD; b.) TTE 09/07/2017: EF >55%, mild LAE, Ao sclerosis without stenosis, degen MV disease, G2DD; c.) TTE 12/02/2018: EF 55-60%, mild LVH, Mild BAE, triv TR, G1DD; d.) TTE 11/22/2019: EF 60-65%, mod LVH, mild TR, G1DD   Anemia in stage 4 chronic kidney disease (HCC)    Arthritis    Cardiomyopathy (HCC)    CKD (chronic kidney disease) stage 4, GFR 15-29 ml/min (HCC)    CVA (cerebral vascular accident) (HCC) 08/2017   a.) no residual deficits   Diabetic neuropathy (HCC)    Diverticulitis    GERD (gastroesophageal reflux disease)    History of hiatal hernia    Hyperlipidemia    Hypertension    Long term current use of antithrombotics/antiplatelets    a.) DAPT therapy (ASA + clopidogrel )   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)    Paraesophageal hernia    PHN (postherpetic neuralgia)    Sarcoidosis    Shingles    Type 2 diabetes mellitus treated with insulin  (HCC)    Wears dentures    partial upper    Assessment: Patient Reported Symptoms: Cognitive Cognitive Status: Alert and oriented to person, place, and time, Normal speech and language skills Cognitive/Intellectual Conditions Management [RPT]: None reported or documented in medical history or problem list Health Maintenance Behaviors: Annual physical exam, Stress management, Social activities Healing Pattern: Unsure Health Facilitated by: Rest  Neurological Neurological Review of Symptoms: No symptoms reported Neurological Management  Strategies: Routine screening Neurological Self-Management Outcome: 4 (good)  HEENT HEENT Symptoms Reported: No symptoms reported HEENT Management Strategies: Medical device, Medication therapy, Routine screening HEENT Self-Management Outcome: 4 (good)  Cardiovascular Cardiovascular Symptoms Reported: No symptoms reported Does patient have uncontrolled Hypertension?: No Weight: 125 lb (56.7 kg)  Respiratory Respiratory Symptoms Reported: No symptoms reported Respiratory Self-Management Outcome: 4 (good)  Endocrine Endocrine Symptoms Reported: Other Other symptoms related to hypoglycemia or hyperglycemia: Currently being treated by the [facility] Oncology team  for pancreatic cancer Is patient diabetic?: Yes Is patient checking blood sugars at home?: Yes List most recent blood sugar readings, include date and time of day: Currently using Freestyle Libre. Reports fasting readings have fluctuated and ranged from the 70s to 170s.  Gastrointestinal Gastrointestinal Symptoms Reported: No symptoms reported  Genitourinary Genitourinary Symptoms Reported: Other Other Genitourinary Symptoms: Reports episodes of incontinence which has been a chronic issue  Integumentary Integumentary Symptoms Reported: No symptoms reported  Musculoskeletal Musculoskelatal Symptoms Reviewed: No symptoms reported Falls in the past year?: Yes Number of falls in past year: 2 or more Patient at Risk for Falls Due to: History of fall(s), Impaired balance/gait, Medication side effect Fall risk Follow up: Falls prevention discussed  Psychosocial Psychosocial Symptoms Reported: No symptoms reported Quality of Family Relationships: supportive, helpful, involved Do you feel physically threatened by others?: No     There were no vitals filed for this visit.  Medications Reviewed Today     Reviewed by Karoline Lima, RN (Registered Nurse) on 08/12/24 at 1549  Med List Status: <None>   Medication Order Taking? Sig  Documenting  Provider Last Dose Status Informant  acetaminophen  (TYLENOL ) 650 MG CR tablet [id number]  Take 650 mg by mouth every 8 (eight) hours as needed for pain. [provider]  Active   aspirin  81 MG tablet [id number]  Take 81 mg by mouth daily. [provider]  Active Self  carvedilol  (COREG ) 3.125 MG tablet [id number]  Take 1 tablet (3.125 mg total) by mouth 2 (two) times daily with a meal. PLEASE CALL [phone] TO SCHEDULE YEARLY APPOINTMENT FOR FURTHER REFILLS.  (THIRD ATTEMPT) Perla, Evalene PARAS, MD  Active   Continuous Glucose Sensor (FREESTYLE LIBRE 14 DAY SENSOR) [place] [id number]  by Does not apply route. [provider]  Active   diclofenac  Sodium (VOLTAREN ) 1 % GEL [id number]  Apply 4 g topically 4 (four) times daily. Thaddeus Hyla Givens, NP  Active   Insulin  Glargine [facility]) 100 UNIT/ML [id number]  Inject 4 Units into the skin at bedtime. [provider]  Active   insulin  lispro (HUMALOG) 100 UNIT/ML injection [id number]  Inject 8 Units into the skin 3 (three) times daily before meals. [provider]  Active   meclizine  (ANTIVERT ) 12.5 MG tablet [id number]  Take 12.5 mg by mouth 3 (three) times daily. [provider]  Active            Med Note WARDEN, PAMELA O   Thu Mar 31, 2024 10:57 AM) Taking as needed  NOVOFINE 32G X 6 MM MISC [id number]  USE AS DIRECTED THREE TIMES DAILY WITH HUMALOG Cannady, [name] T, NP  Active   olmesartan  (BENICAR ) 40 MG tablet [id number]  Take 1 tablet (40 mg total) by mouth daily. Cannady, [name] T, NP  Active   pantoprazole  (PROTONIX ) 40 MG tablet [id number]  Take 1 tablet (40 mg total) by mouth daily. Cannady, [name] T, NP  Active   pioglitazone  (ACTOS ) 30 MG tablet [id number]  Take 30 mg by mouth daily. [provider]  Active   polyethylene glycol (MIRALAX  / GLYCOLAX ) 17 g packet [id number]  Take 17 g by mouth 2 (two) times daily. Lane Shope, MD  Active   prochlorperazine (COMPAZINE) 10 MG  tablet [id number]  Take 10 mg by mouth every 6 (six) hours as needed. [provider]  Active   rosuvastatin  (CRESTOR ) 10 MG tablet [id number]  Take 1 tablet (10 mg total) by mouth daily. Cannady, [name] T, NP  Active   senna (SENOKOT) 8.6 MG TABS tablet [id number]  Take 2 tablets by mouth daily as needed for mild constipation. [provider]  Active   simethicone  (MYLICON) 80 MG chewable tablet [id number]  Chew 1 tablet (80 mg total) by mouth 4 (four) times daily as needed for flatulence. Lane Shope, MD  Active   torsemide  (DEMADEX ) 20 MG tablet [id number]  TAKE 2 TABLETS(40 MG) BY MOUTH DAILY Gollan, Timothy J, MD  Active   vitamin B-12 (CYANOCOBALAMIN ) 100 MCG tablet [id number]  Take 100 mcg by mouth daily. [provider]  Active            Med Note LAURALYN, AMY E   Tue Feb 23, 2024  1:32 PM) Hasn't refilled  Med List Note Geanie Wolm HERO, RN 04/18/20 1437): MR 06-17-2020 UDS 04-18-2020 Med agreement signed 04-18-2020            Recommendation:   Continue Current Plan of Care  Follow Up Plan:   Telephone follow up appointment with Nurse Case Manager on September 08, 2024   Jackson Acron [facility]  RN Care Manager Direct Dial: [phone]  Fax: [phone] Website: [email or web address]

## 2024-08-13 NOTE — Patient Instructions (Addendum)
 Thank you for allowing the Complex Care Management team to participate in your care. It was great speaking with you!  We will follow up on September 08, 2024 at 4 pm. Please do not hesitate to contact me if you require assistance prior to our next outreach.    Jackson Acron [facility] Population Health RN Care Manager Direct Dial: [phone]  Fax: [phone] Website: [email or web address]

## 2024-08-16 DIAGNOSIS — N2581 Secondary hyperparathyroidism of renal origin: Secondary | ICD-10-CM | POA: Diagnosis not present

## 2024-08-16 DIAGNOSIS — N184 Chronic kidney disease, stage 4 (severe): Secondary | ICD-10-CM | POA: Diagnosis not present

## 2024-08-16 DIAGNOSIS — R809 Proteinuria, unspecified: Secondary | ICD-10-CM | POA: Diagnosis not present

## 2024-08-16 DIAGNOSIS — I129 Hypertensive chronic kidney disease with stage 1 through stage 4 chronic kidney disease, or unspecified chronic kidney disease: Secondary | ICD-10-CM | POA: Diagnosis not present

## 2024-08-16 DIAGNOSIS — D631 Anemia in chronic kidney disease: Secondary | ICD-10-CM | POA: Diagnosis not present

## 2024-08-16 DIAGNOSIS — E875 Hyperkalemia: Secondary | ICD-10-CM | POA: Diagnosis not present

## 2024-08-16 DIAGNOSIS — E1122 Type 2 diabetes mellitus with diabetic chronic kidney disease: Secondary | ICD-10-CM | POA: Diagnosis not present

## 2024-08-17 DIAGNOSIS — E1142 Type 2 diabetes mellitus with diabetic polyneuropathy: Secondary | ICD-10-CM | POA: Diagnosis not present

## 2024-08-17 DIAGNOSIS — I130 Hypertensive heart and chronic kidney disease with heart failure and stage 1 through stage 4 chronic kidney disease, or unspecified chronic kidney disease: Secondary | ICD-10-CM | POA: Diagnosis not present

## 2024-08-17 DIAGNOSIS — E1165 Type 2 diabetes mellitus with hyperglycemia: Secondary | ICD-10-CM | POA: Diagnosis not present

## 2024-08-17 DIAGNOSIS — D631 Anemia in chronic kidney disease: Secondary | ICD-10-CM | POA: Diagnosis not present

## 2024-08-17 DIAGNOSIS — Z7984 Long term (current) use of oral hypoglycemic drugs: Secondary | ICD-10-CM | POA: Diagnosis not present

## 2024-08-17 DIAGNOSIS — K529 Noninfective gastroenteritis and colitis, unspecified: Secondary | ICD-10-CM | POA: Diagnosis not present

## 2024-08-17 DIAGNOSIS — Z794 Long term (current) use of insulin: Secondary | ICD-10-CM | POA: Diagnosis not present

## 2024-08-17 DIAGNOSIS — Z79899 Other long term (current) drug therapy: Secondary | ICD-10-CM | POA: Diagnosis not present

## 2024-08-17 DIAGNOSIS — K649 Unspecified hemorrhoids: Secondary | ICD-10-CM | POA: Diagnosis not present

## 2024-08-17 DIAGNOSIS — Z8673 Personal history of transient ischemic attack (TIA), and cerebral infarction without residual deficits: Secondary | ICD-10-CM | POA: Diagnosis not present

## 2024-08-17 DIAGNOSIS — D630 Anemia in neoplastic disease: Secondary | ICD-10-CM | POA: Diagnosis not present

## 2024-08-17 DIAGNOSIS — D649 Anemia, unspecified: Secondary | ICD-10-CM | POA: Diagnosis not present

## 2024-08-17 DIAGNOSIS — K602 Anal fissure, unspecified: Secondary | ICD-10-CM | POA: Diagnosis not present

## 2024-08-17 DIAGNOSIS — K5909 Other constipation: Secondary | ICD-10-CM | POA: Diagnosis not present

## 2024-08-17 DIAGNOSIS — E1122 Type 2 diabetes mellitus with diabetic chronic kidney disease: Secondary | ICD-10-CM | POA: Diagnosis not present

## 2024-08-17 DIAGNOSIS — C250 Malignant neoplasm of head of pancreas: Secondary | ICD-10-CM | POA: Diagnosis not present

## 2024-08-17 DIAGNOSIS — N184 Chronic kidney disease, stage 4 (severe): Secondary | ICD-10-CM | POA: Diagnosis not present

## 2024-08-17 DIAGNOSIS — I5032 Chronic diastolic (congestive) heart failure: Secondary | ICD-10-CM | POA: Diagnosis not present

## 2024-08-17 DIAGNOSIS — Z9221 Personal history of antineoplastic chemotherapy: Secondary | ICD-10-CM | POA: Diagnosis not present

## 2024-08-17 DIAGNOSIS — K6289 Other specified diseases of anus and rectum: Secondary | ICD-10-CM | POA: Diagnosis not present

## 2024-08-21 DIAGNOSIS — R41841 Cognitive communication deficit: Secondary | ICD-10-CM | POA: Diagnosis not present

## 2024-08-21 DIAGNOSIS — R2689 Other abnormalities of gait and mobility: Secondary | ICD-10-CM | POA: Diagnosis not present

## 2024-08-21 DIAGNOSIS — M6281 Muscle weakness (generalized): Secondary | ICD-10-CM | POA: Diagnosis not present

## 2024-08-21 DIAGNOSIS — R5081 Fever presenting with conditions classified elsewhere: Secondary | ICD-10-CM | POA: Diagnosis not present

## 2024-08-21 DIAGNOSIS — D6869 Other thrombophilia: Secondary | ICD-10-CM | POA: Diagnosis not present

## 2024-08-21 DIAGNOSIS — R531 Weakness: Secondary | ICD-10-CM | POA: Diagnosis not present

## 2024-08-21 DIAGNOSIS — I10 Essential (primary) hypertension: Secondary | ICD-10-CM | POA: Diagnosis not present

## 2024-08-21 DIAGNOSIS — E785 Hyperlipidemia, unspecified: Secondary | ICD-10-CM | POA: Diagnosis not present

## 2024-08-21 DIAGNOSIS — E1140 Type 2 diabetes mellitus with diabetic neuropathy, unspecified: Secondary | ICD-10-CM | POA: Diagnosis not present

## 2024-08-21 DIAGNOSIS — I130 Hypertensive heart and chronic kidney disease with heart failure and stage 1 through stage 4 chronic kidney disease, or unspecified chronic kidney disease: Secondary | ICD-10-CM | POA: Diagnosis not present

## 2024-08-21 DIAGNOSIS — E876 Hypokalemia: Secondary | ICD-10-CM | POA: Diagnosis not present

## 2024-08-21 DIAGNOSIS — C250 Malignant neoplasm of head of pancreas: Secondary | ICD-10-CM | POA: Diagnosis not present

## 2024-08-21 DIAGNOSIS — R1032 Left lower quadrant pain: Secondary | ICD-10-CM | POA: Diagnosis not present

## 2024-08-21 DIAGNOSIS — N184 Chronic kidney disease, stage 4 (severe): Secondary | ICD-10-CM | POA: Diagnosis not present

## 2024-08-21 DIAGNOSIS — Z8673 Personal history of transient ischemic attack (TIA), and cerebral infarction without residual deficits: Secondary | ICD-10-CM | POA: Diagnosis not present

## 2024-08-21 DIAGNOSIS — Z794 Long term (current) use of insulin: Secondary | ICD-10-CM | POA: Diagnosis not present

## 2024-08-21 DIAGNOSIS — K449 Diaphragmatic hernia without obstruction or gangrene: Secondary | ICD-10-CM | POA: Diagnosis not present

## 2024-08-21 DIAGNOSIS — R197 Diarrhea, unspecified: Secondary | ICD-10-CM | POA: Diagnosis not present

## 2024-08-21 DIAGNOSIS — R1314 Dysphagia, pharyngoesophageal phase: Secondary | ICD-10-CM | POA: Diagnosis not present

## 2024-08-21 DIAGNOSIS — R1310 Dysphagia, unspecified: Secondary | ICD-10-CM | POA: Diagnosis not present

## 2024-08-21 DIAGNOSIS — R627 Adult failure to thrive: Secondary | ICD-10-CM | POA: Diagnosis not present

## 2024-08-21 DIAGNOSIS — R112 Nausea with vomiting, unspecified: Secondary | ICD-10-CM | POA: Diagnosis not present

## 2024-08-21 DIAGNOSIS — I5032 Chronic diastolic (congestive) heart failure: Secondary | ICD-10-CM | POA: Diagnosis not present

## 2024-08-21 DIAGNOSIS — M25569 Pain in unspecified knee: Secondary | ICD-10-CM | POA: Diagnosis not present

## 2024-08-21 DIAGNOSIS — D709 Neutropenia, unspecified: Secondary | ICD-10-CM | POA: Diagnosis not present

## 2024-08-21 DIAGNOSIS — Z7982 Long term (current) use of aspirin: Secondary | ICD-10-CM | POA: Diagnosis not present

## 2024-08-21 DIAGNOSIS — F32A Depression, unspecified: Secondary | ICD-10-CM | POA: Diagnosis not present

## 2024-08-21 DIAGNOSIS — T451X5A Adverse effect of antineoplastic and immunosuppressive drugs, initial encounter: Secondary | ICD-10-CM | POA: Diagnosis not present

## 2024-08-21 DIAGNOSIS — A601 Herpesviral infection of perianal skin and rectum: Secondary | ICD-10-CM | POA: Diagnosis not present

## 2024-08-21 DIAGNOSIS — D849 Immunodeficiency, unspecified: Secondary | ICD-10-CM | POA: Diagnosis not present

## 2024-08-21 DIAGNOSIS — D701 Agranulocytosis secondary to cancer chemotherapy: Secondary | ICD-10-CM | POA: Diagnosis not present

## 2024-08-21 DIAGNOSIS — R1110 Vomiting, unspecified: Secondary | ICD-10-CM | POA: Diagnosis not present

## 2024-08-21 DIAGNOSIS — N189 Chronic kidney disease, unspecified: Secondary | ICD-10-CM | POA: Diagnosis not present

## 2024-08-21 DIAGNOSIS — K5900 Constipation, unspecified: Secondary | ICD-10-CM | POA: Diagnosis not present

## 2024-08-21 DIAGNOSIS — K1379 Other lesions of oral mucosa: Secondary | ICD-10-CM | POA: Diagnosis not present

## 2024-08-21 DIAGNOSIS — M7989 Other specified soft tissue disorders: Secondary | ICD-10-CM | POA: Diagnosis not present

## 2024-08-21 DIAGNOSIS — C259 Malignant neoplasm of pancreas, unspecified: Secondary | ICD-10-CM | POA: Diagnosis not present

## 2024-08-21 DIAGNOSIS — Z1152 Encounter for screening for COVID-19: Secondary | ICD-10-CM | POA: Diagnosis not present

## 2024-08-21 DIAGNOSIS — K644 Residual hemorrhoidal skin tags: Secondary | ICD-10-CM | POA: Diagnosis not present

## 2024-08-21 DIAGNOSIS — Z8507 Personal history of malignant neoplasm of pancreas: Secondary | ICD-10-CM | POA: Diagnosis not present

## 2024-08-21 DIAGNOSIS — D61818 Other pancytopenia: Secondary | ICD-10-CM | POA: Diagnosis not present

## 2024-08-21 DIAGNOSIS — K521 Toxic gastroenteritis and colitis: Secondary | ICD-10-CM | POA: Diagnosis not present

## 2024-08-21 DIAGNOSIS — B009 Herpesviral infection, unspecified: Secondary | ICD-10-CM | POA: Diagnosis not present

## 2024-08-21 DIAGNOSIS — E1142 Type 2 diabetes mellitus with diabetic polyneuropathy: Secondary | ICD-10-CM | POA: Diagnosis not present

## 2024-08-21 DIAGNOSIS — N3000 Acute cystitis without hematuria: Secondary | ICD-10-CM | POA: Diagnosis not present

## 2024-08-21 DIAGNOSIS — K648 Other hemorrhoids: Secondary | ICD-10-CM | POA: Diagnosis not present

## 2024-08-21 DIAGNOSIS — R2681 Unsteadiness on feet: Secondary | ICD-10-CM | POA: Diagnosis not present

## 2024-08-21 DIAGNOSIS — D61810 Antineoplastic chemotherapy induced pancytopenia: Secondary | ICD-10-CM | POA: Diagnosis not present

## 2024-08-21 DIAGNOSIS — I5030 Unspecified diastolic (congestive) heart failure: Secondary | ICD-10-CM | POA: Diagnosis not present

## 2024-08-21 DIAGNOSIS — Z681 Body mass index (BMI) 19 or less, adult: Secondary | ICD-10-CM | POA: Diagnosis not present

## 2024-08-21 DIAGNOSIS — K649 Unspecified hemorrhoids: Secondary | ICD-10-CM | POA: Diagnosis not present

## 2024-08-21 DIAGNOSIS — Z7901 Long term (current) use of anticoagulants: Secondary | ICD-10-CM | POA: Diagnosis not present

## 2024-08-21 DIAGNOSIS — E1122 Type 2 diabetes mellitus with diabetic chronic kidney disease: Secondary | ICD-10-CM | POA: Diagnosis not present

## 2024-08-21 DIAGNOSIS — F419 Anxiety disorder, unspecified: Secondary | ICD-10-CM | POA: Diagnosis not present

## 2024-08-21 DIAGNOSIS — K529 Noninfective gastroenteritis and colitis, unspecified: Secondary | ICD-10-CM | POA: Diagnosis not present

## 2024-08-29 DIAGNOSIS — M48061 Spinal stenosis, lumbar region without neurogenic claudication: Secondary | ICD-10-CM | POA: Diagnosis not present

## 2024-08-29 DIAGNOSIS — D631 Anemia in chronic kidney disease: Secondary | ICD-10-CM | POA: Diagnosis not present

## 2024-08-29 DIAGNOSIS — R911 Solitary pulmonary nodule: Secondary | ICD-10-CM | POA: Diagnosis not present

## 2024-08-29 DIAGNOSIS — J811 Chronic pulmonary edema: Secondary | ICD-10-CM | POA: Diagnosis not present

## 2024-08-29 DIAGNOSIS — M160 Bilateral primary osteoarthritis of hip: Secondary | ICD-10-CM | POA: Diagnosis not present

## 2024-08-29 DIAGNOSIS — K602 Anal fissure, unspecified: Secondary | ICD-10-CM | POA: Diagnosis not present

## 2024-08-29 DIAGNOSIS — K1379 Other lesions of oral mucosa: Secondary | ICD-10-CM | POA: Diagnosis not present

## 2024-08-29 DIAGNOSIS — D649 Anemia, unspecified: Secondary | ICD-10-CM | POA: Diagnosis not present

## 2024-08-29 DIAGNOSIS — I129 Hypertensive chronic kidney disease with stage 1 through stage 4 chronic kidney disease, or unspecified chronic kidney disease: Secondary | ICD-10-CM | POA: Diagnosis not present

## 2024-08-29 DIAGNOSIS — B009 Herpesviral infection, unspecified: Secondary | ICD-10-CM | POA: Diagnosis not present

## 2024-08-29 DIAGNOSIS — C250 Malignant neoplasm of head of pancreas: Secondary | ICD-10-CM | POA: Diagnosis not present

## 2024-08-29 DIAGNOSIS — I10 Essential (primary) hypertension: Secondary | ICD-10-CM | POA: Diagnosis not present

## 2024-08-29 DIAGNOSIS — R1032 Left lower quadrant pain: Secondary | ICD-10-CM | POA: Diagnosis not present

## 2024-08-29 DIAGNOSIS — R197 Diarrhea, unspecified: Secondary | ICD-10-CM | POA: Diagnosis not present

## 2024-08-29 DIAGNOSIS — R1314 Dysphagia, pharyngoesophageal phase: Secondary | ICD-10-CM | POA: Diagnosis not present

## 2024-08-29 DIAGNOSIS — R531 Weakness: Secondary | ICD-10-CM | POA: Diagnosis not present

## 2024-08-29 DIAGNOSIS — R109 Unspecified abdominal pain: Secondary | ICD-10-CM | POA: Diagnosis not present

## 2024-08-29 DIAGNOSIS — M47816 Spondylosis without myelopathy or radiculopathy, lumbar region: Secondary | ICD-10-CM | POA: Diagnosis not present

## 2024-08-29 DIAGNOSIS — E1140 Type 2 diabetes mellitus with diabetic neuropathy, unspecified: Secondary | ICD-10-CM | POA: Diagnosis not present

## 2024-08-29 DIAGNOSIS — R2681 Unsteadiness on feet: Secondary | ICD-10-CM | POA: Diagnosis not present

## 2024-08-29 DIAGNOSIS — R600 Localized edema: Secondary | ICD-10-CM | POA: Diagnosis not present

## 2024-08-29 DIAGNOSIS — D61810 Antineoplastic chemotherapy induced pancytopenia: Secondary | ICD-10-CM | POA: Diagnosis not present

## 2024-08-29 DIAGNOSIS — B370 Candidal stomatitis: Secondary | ICD-10-CM | POA: Diagnosis not present

## 2024-08-29 DIAGNOSIS — R29818 Other symptoms and signs involving the nervous system: Secondary | ICD-10-CM | POA: Diagnosis not present

## 2024-08-29 DIAGNOSIS — D72821 Monocytosis (symptomatic): Secondary | ICD-10-CM | POA: Diagnosis not present

## 2024-08-29 DIAGNOSIS — R188 Other ascites: Secondary | ICD-10-CM | POA: Diagnosis not present

## 2024-08-29 DIAGNOSIS — R112 Nausea with vomiting, unspecified: Secondary | ICD-10-CM | POA: Diagnosis not present

## 2024-08-29 DIAGNOSIS — I499 Cardiac arrhythmia, unspecified: Secondary | ICD-10-CM | POA: Diagnosis not present

## 2024-08-29 DIAGNOSIS — Z79899 Other long term (current) drug therapy: Secondary | ICD-10-CM | POA: Diagnosis not present

## 2024-08-29 DIAGNOSIS — Z7984 Long term (current) use of oral hypoglycemic drugs: Secondary | ICD-10-CM | POA: Diagnosis not present

## 2024-08-29 DIAGNOSIS — M4807 Spinal stenosis, lumbosacral region: Secondary | ICD-10-CM | POA: Diagnosis not present

## 2024-08-29 DIAGNOSIS — D630 Anemia in neoplastic disease: Secondary | ICD-10-CM | POA: Diagnosis not present

## 2024-08-29 DIAGNOSIS — Z8719 Personal history of other diseases of the digestive system: Secondary | ICD-10-CM | POA: Diagnosis not present

## 2024-08-29 DIAGNOSIS — E1165 Type 2 diabetes mellitus with hyperglycemia: Secondary | ICD-10-CM | POA: Diagnosis not present

## 2024-08-29 DIAGNOSIS — R1012 Left upper quadrant pain: Secondary | ICD-10-CM | POA: Diagnosis not present

## 2024-08-29 DIAGNOSIS — K649 Unspecified hemorrhoids: Secondary | ICD-10-CM | POA: Diagnosis not present

## 2024-08-29 DIAGNOSIS — M6281 Muscle weakness (generalized): Secondary | ICD-10-CM | POA: Diagnosis not present

## 2024-08-29 DIAGNOSIS — Z8673 Personal history of transient ischemic attack (TIA), and cerebral infarction without residual deficits: Secondary | ICD-10-CM | POA: Diagnosis not present

## 2024-08-29 DIAGNOSIS — E1122 Type 2 diabetes mellitus with diabetic chronic kidney disease: Secondary | ICD-10-CM | POA: Diagnosis not present

## 2024-08-29 DIAGNOSIS — I5032 Chronic diastolic (congestive) heart failure: Secondary | ICD-10-CM | POA: Diagnosis not present

## 2024-08-29 DIAGNOSIS — R2689 Other abnormalities of gait and mobility: Secondary | ICD-10-CM | POA: Diagnosis not present

## 2024-08-29 DIAGNOSIS — K529 Noninfective gastroenteritis and colitis, unspecified: Secondary | ICD-10-CM | POA: Diagnosis not present

## 2024-08-29 DIAGNOSIS — A601 Herpesviral infection of perianal skin and rectum: Secondary | ICD-10-CM | POA: Diagnosis not present

## 2024-08-29 DIAGNOSIS — K209 Esophagitis, unspecified without bleeding: Secondary | ICD-10-CM | POA: Diagnosis not present

## 2024-08-29 DIAGNOSIS — R29898 Other symptoms and signs involving the musculoskeletal system: Secondary | ICD-10-CM | POA: Diagnosis not present

## 2024-08-29 DIAGNOSIS — N184 Chronic kidney disease, stage 4 (severe): Secondary | ICD-10-CM | POA: Diagnosis not present

## 2024-08-29 DIAGNOSIS — D709 Neutropenia, unspecified: Secondary | ICD-10-CM | POA: Diagnosis not present

## 2024-08-29 DIAGNOSIS — K5900 Constipation, unspecified: Secondary | ICD-10-CM | POA: Diagnosis not present

## 2024-08-29 DIAGNOSIS — R29810 Facial weakness: Secondary | ICD-10-CM | POA: Diagnosis not present

## 2024-08-29 DIAGNOSIS — Z794 Long term (current) use of insulin: Secondary | ICD-10-CM | POA: Diagnosis not present

## 2024-08-29 DIAGNOSIS — Z7982 Long term (current) use of aspirin: Secondary | ICD-10-CM | POA: Diagnosis not present

## 2024-08-29 DIAGNOSIS — E278 Other specified disorders of adrenal gland: Secondary | ICD-10-CM | POA: Diagnosis not present

## 2024-08-29 DIAGNOSIS — Z5111 Encounter for antineoplastic chemotherapy: Secondary | ICD-10-CM | POA: Diagnosis not present

## 2024-08-29 DIAGNOSIS — M25569 Pain in unspecified knee: Secondary | ICD-10-CM | POA: Diagnosis not present

## 2024-08-29 DIAGNOSIS — E785 Hyperlipidemia, unspecified: Secondary | ICD-10-CM | POA: Diagnosis not present

## 2024-08-29 DIAGNOSIS — I639 Cerebral infarction, unspecified: Secondary | ICD-10-CM | POA: Diagnosis not present

## 2024-08-29 DIAGNOSIS — E1142 Type 2 diabetes mellitus with diabetic polyneuropathy: Secondary | ICD-10-CM | POA: Diagnosis not present

## 2024-08-29 DIAGNOSIS — C259 Malignant neoplasm of pancreas, unspecified: Secondary | ICD-10-CM | POA: Diagnosis not present

## 2024-08-29 DIAGNOSIS — I130 Hypertensive heart and chronic kidney disease with heart failure and stage 1 through stage 4 chronic kidney disease, or unspecified chronic kidney disease: Secondary | ICD-10-CM | POA: Diagnosis not present

## 2024-08-29 DIAGNOSIS — R630 Anorexia: Secondary | ICD-10-CM | POA: Diagnosis not present

## 2024-08-29 DIAGNOSIS — E876 Hypokalemia: Secondary | ICD-10-CM | POA: Diagnosis not present

## 2024-08-29 DIAGNOSIS — R41841 Cognitive communication deficit: Secondary | ICD-10-CM | POA: Diagnosis not present

## 2024-08-29 DIAGNOSIS — R1310 Dysphagia, unspecified: Secondary | ICD-10-CM | POA: Diagnosis not present

## 2024-08-29 DIAGNOSIS — L89152 Pressure ulcer of sacral region, stage 2: Secondary | ICD-10-CM | POA: Diagnosis not present

## 2024-08-29 NOTE — Discharge Summary (Signed)
 ------------------------------------------------------------------------------- Attestation signed by [name], Tonya Diane, MD at 08/29/24 1749 I saw and evaluated the patient, participating in the key portions of the service on the day of discharge.  I reviewed the resident's note and agree with the discharge plans and disposition. I personally spent 20 minutes in discharge planning services. Tonya JONETTA Barrack, MD  -------------------------------------------------------------------------------   Physician Discharge Summary HBR 4 BT1 HBR [address] [place] [address] Dept: [phone] Loc: [phone]   Identifying Information:  Tonya [name] 11-Dec-1946 [id number]  Primary Care Physician: [name], Tonya Tadzia, AGNP   Code Status: Full Code  Admit Date: 08/21/2024  Discharge Date: 08/29/2024   Discharge To: Skilled nursing facility  Discharge Service: HBR - Geriatrics Floor Team ([facility])   Discharge Attending Physician: Tonya Loa Barrack, MD  Discharge Diagnoses:  Principal Problem:   Neutropenia (POA: Unknown) Active Problems:   Anxiety and depression (POA: Yes)   Chronic kidney disease, stage 4, severely decreased GFR    (CMS-HCC) (POA: Yes)   Hyperlipidemia, unspecified (POA: Yes)   Hypertension (POA: Yes)   Type 2 diabetes mellitus with diabetic neuropathy    (CMS-HCC) (POA: Yes)   Pancytopenia (CMS-HCC) (POA: Yes)   Pancreatic mass (HHS-HCC) (POA: Yes)   Malignant neoplasm of head of pancreas    (CMS-HCC) (POA: Yes)   Colitis (POA: Unknown) Resolved Problems:   * No resolved hospital problems. *  Geriatric Assessment Summary: No geriatric assessment performed is deemed unnecessary in this patient.  Hospital Course:  Tonya [name] is a 78 y.o. female whose presentation is complicated by HTN, HLD, T2DM, pancreatic cancer, HFpEF, stroke, CKD, anxiety, and anemia that presented to [facility] with weakness, diarrhea, emesis likely due to  chemotherapy induced colitis and found to be pancytopenic.  Chemotherapy-induced Colitis Patient presented with increased frequency of diarrhea since the addition of irinotecan  in her cancer treatment. This has been particularly worse in the two-three weeks prior to presentation. CT A/P on admission showed multultifocal colonic mural thickening which could reflect mucosal injury caused by irinotecan  and/or oxaloplatin, however infectious colitis was also on the differential given patient's neutropenia described below. GIPP and C.diff ultimately negative. She was treated with a 5 days course of antibiotics for the initial concern of infectious colitis. Her diarrhea and overall symptomatology improved significantly after introducing imodium. She was counseled to follow-up with her Oncologist to discuss long-term management of chemotherapy-associated diarrhea.    Pancytopenia  Neutropenia  Has received 5 cycles chemotherapy, most recent being mFOLFIRINOX on 8/28. Per chart review, appears she normally receives pegfilgastim following infusions, but was held on 8/28 as her ANC was 30.7 and was 56.5 on appointment prior 8/20. On presentation to the ED on 9/7, ANC was 0.1. WBC 1.0, Hg 6.0, platelets 91, which were all significantly decreased from her baseline over a 7 year span. She required 2 units of pRBCs while in the hospital, however her counts all began to improve without other intervention which was re-assuring. This was likely secondary to chemotherapy treatment. Acute infectious etiologies were felt less likely.   Rectal ulcers  HSV-2 (+)  History of hemorrhoids  Patient complained of significant rectal pain with bowel movements. External rectal exam demonstrated external hemorrhoids along with multiple, small superficial ulcerations immediately surrounding the rectum and extending up the buttocks. Swab of the lesion was positive for HSV-2. She was started on Valtrex 1g daily and she will finish the  10-day total course after discharge. She was counseled to follow-up with her  PCP and/or Oncologist for a repeat exam to see if she may require a longer course of treatment. Her pain also improved significantly with lidocaine  jelly around the rectum.   Pancreatic Adenocarcinoma Follows with Tonya [name]. Patient originally diagnosed in June 2025 when she presented with hyperglycemia and weight loss. CT showed a 3.5 cm mass in the pancreatic head. FNA confirmed moderately differentiated adenocarcinoma. Managed initially on mFOLFIRNOX with plans of repeat imaging to assess surgical resectability. Repeat scans planned for 9/19. Last chemo dose was on 8/28. It appears that pancytopenia and worsening of colitis may be related to cancer treatment. She was counseled to follow-up after discharge.   Dysphagia Patient presented with minimal PO intake over the past few weeks. Favored liquids over solids because of difficulty with swallowing. CT did show a hiatal hernia with mural thickening of the distal esophagus. Hiatal hernia appears stable from prior imaging.  Throughout hospitalization, patient did not endorse further throat pain or food getting stuck in throat while eating. Further evaluation was deferred however would have low threshold to consider infectious etiologies if persists, especially given her immunosuppression.   Hypokalemia  In the setting of diarrhea. Was repleted while inpatient.  T2DM  Peripheral Neuropathy  Follows with endocrinology. She is insulin  dependent. Last A1c 11.9% in June. Insulin  regimen has changed quite a bit since chemotherapy initiation and the start of dexamethasone . Daughter reports patient often does not take any insulin  because she is not eating regularly. Ordered SSI while in the hospital, however her sugars were stable and did not require any correctional insulin .   HFpEF  Hypertension  Hyperlipidemia Last ECHO showed normal EF, mild LVH, G1DD. She consistently takes  her medications as prescribed. Her daughter fills up her pill box for her. No shortness of breath on admission. proBNP elevated to 3,036 from 276 in June. No evidence of volume overload during admission.   CKD4 Baseline creatinine around 2. Follows with [employer]. Kidney disease 2/2 diabetic nephropathy. Cr on admission 1.62 far below her baseline. Her renal function remained stable throughout admission.  The patient's hospital stay has been complicated by the following clinically significant conditions requiring additional evaluation and treatment or having a significant effect of this patient's care: - Malnutrition POA requiring further investigation, treatment, or monitoring - Medication-induced coagulopathy with bleeding POA requiring further treatment, investigation or monitoring - Thrombocytopenia POA requiring further investigation or monitor - Anemia POA requiring further investigation or monitoring - Hypokalemia POA requiring further investigation, treatment, or monitoring   Outpatient Provider Follow Up Issues:  [ ]  Pioglitazone  held during hospitalization and only required minimal sliding scale insulin , please evaluate if patient should restart pioglitazone .  [ ]  Torsemide  held follow up patient fluid status and consider restarting.  [ ]  If diarrhea uncontrolled with imodium, can restart lomotil  Touchbase with Outpatient Provider: Warm Handoff: Completed on 08/29/24 by Dale Ozell Ladora Arnell, MD  (Intern) via [facility] Message  Procedures: None ______________________________________________________________________ Discharge Medications:    Your Medication List     PAUSE taking these medications    BASAGLAR KWIKPEN U-100 INSULIN  100 unit/mL (3 mL) injection pen Wait to take this until your doctor or other care provider tells you to start again. Generic drug: insulin  glargine Inject 4 Units under the skin nightly. Indications: type 2 diabetes mellitus    dexAMETHasone  4 MG tablet Wait to take this until your doctor or other care provider tells you to start again. Hold until next chemotherapy cycle Commonly known as: DECADRON  Take  2 tablets (8 mg total) by mouth in the morning. Take only on days 2, 3, and 4 of each 14-day chemotherapy cycle.   insulin  lispro 100 unit/mL injection pen Wait to take this until your doctor or other care provider tells you to start again. Commonly known as: HumaLOG KwikPen Insulin  Inject 8 Units under the skin Three (3) times a day before meals. You also have another medication with the same name that you may need to continue taking.   pioglitazone  30 MG tablet Wait to take this until your doctor or other care provider tells you to start again. Commonly known as: ACTOS  Take 1 tablet (30 mg total) by mouth daily before breakfast.   torsemide  20 MG tablet Wait to take this until your doctor or other care provider tells you to start again. Commonly known as: DEMADEX  Take 2 tablets (40 mg total) by mouth in the morning.       STOP taking these medications    acetaminophen  650 MG CR tablet Commonly known as: TYLENOL  8 HOUR   diphenoxylate-atropine  2.5-0.025 mg per tablet Commonly known as: Lomotil   FREESTYLE LIBRE 3 PLUS SENSOR Devi Generic drug: blood-glucose sensor   heparin , porcine (PF) 100 unit/mL Syrg   loperamide 2 mg tablet Commonly known as: IMODIUM A-D Replaced by: loperamide 2 mg capsule   oxyCODONE  5 MG immediate release tablet Commonly known as: ROXICODONE    pantoprazole  40 MG tablet Commonly known as: Protonix    sodium chloride  0.9 % injection Commonly known as: NS       START taking these medications    loperamide 2 mg capsule Commonly known as: IMODIUM Take 1 capsule (2 mg total) by mouth four (4) times a day as needed. Replaces: loperamide 2 mg tablet   psyllium 3.4 gram packet Commonly known as: METAMUCIL Take 1 packet by mouth daily. Start taking on:  August 30, 2024   valACYclovir 1000 MG tablet Commonly known as: VALTREX Take 1 tablet (1,000 mg total) by mouth daily for 2 days.       CHANGE how you take these medications    insulin  lispro 100 unit/mL injection pen Commonly known as: HumaLOG If BG 51-70: Consume juice or crackers and notify provider. 71-150: 0 units. 231-280: Give 1 units. 281-300: Give 2 units. >300: Give 3 units and notify provider. What changed: Another medication with the same name was paused. Ask your nurse or doctor if you should take this medication.       CONTINUE taking these medications    aspirin  81 MG tablet Commonly known as: ECOTRIN Take 1 tablet (81 mg total) by mouth in the morning.   carvedilol  3.125 MG tablet Commonly known as: COREG  Take 1 tablet (3.125 mg total) by mouth two (2) times a day.   cyanocobalamin  100 MCG tablet Take 1 tablet (100 mcg total) by mouth daily.   diclofenac  sodium 1 % gel Commonly known as: VOLTAREN  Apply 4 g topically.   FREESTYLE LIBRE 3 READER Misc Generic drug: blood-glucose,receiver,cont 1 Device by Miscellaneous route continuous.   hydrocortisone 2.5 % rectal cream Commonly known as: ANUSOL-HC Insert into the rectum two (2) times a day.   lidocaine  2% mucosal gel 2 % jelly Commonly known as: XYLOCAINE  Apply topically Three (3) times a day for 10 days.   nitroglycerin 0.4 % (w/w) Oint Apply twice daily   ondansetron  8 MG tablet Commonly known as: ZOFRAN  Take 1 tablet by mouth twice daily on days 2, 3 and 4 of each  chemotherapy cycle. May take up to 1 tablet every 8 hours as needed for nausea, but do not take on day 1 of cycles.   ONETOUCH ULTRA BLUE TEST STRIP Strp Generic drug: blood sugar diagnostic   pramoxine-hydrocortisone 2.5-1 % cream Apply topically Three (3) times a day. Apply to hemorrhoids.   prochlorperazine 10 MG tablet Commonly known as: COMPAZINE Take 1 tablet (10 mg total) by mouth every six (6) hours as needed  (Nausea/Vomiting).   rosuvastatin  5 MG tablet Commonly known as: CRESTOR  Take 1 tablet (5 mg total) by mouth daily before breakfast.        Allergies: Dulaglutide, Atorvastatin , Erythromycin with ethanol, Gabapentin , Pravastatin , Pregabalin , and Statins-hmg-coa reductase inhibitors ______________________________________________________________________ Pending Test Results: Pending Labs     Order Current Status   Magnesium Level In process   Phosphorus Level In process   Potassium Level In process       Most Recent Labs: CBC - Results in Past 30 Days Result Component Current Result Ref Range Previous Result Ref Range  HCT 29.2 (L) (08/27/2024) 34.0 - 44.0 % 27.0 (L) (08/26/2024) 34.0 - 44.0 %  HGB 9.7 (L) (08/27/2024) 11.3 - 14.9 g/dL 9.1 (L) (0/87/7974) 88.6 - 14.9 g/dL  MCH 72.9 (0/86/7974) 74.0 - 32.4 pg 27.3 (08/26/2024) 25.9 - 32.4 pg  MCHC 33.1 (08/27/2024) 32.0 - 36.0 g/dL 66.3 (0/87/7974) 67.9 - 36.0 g/dL  MCV 18.3 (0/86/7974) 22.3 - 95.7 fL 81.4 (08/26/2024) 77.6 - 95.7 fL  MPV 8.4 (08/27/2024) 6.8 - 10.7 fL 8.2 (08/26/2024) 6.8 - 10.7 fL  Platelet 132 (L) (08/27/2024) 150 - 450 10*9/L 130 (L) (08/26/2024) 150 - 450 10*9/L  RBC 3.58 (L) (08/27/2024) 3.95 - 5.13 10*12/L 3.32 (L) (08/26/2024) 3.95 - 5.13 10*12/L  WBC 6.4 (08/27/2024) 3.6 - 11.2 10*9/L 6.9 (08/26/2024) 3.6 - 11.2 10*9/L   BMP - Results in Past 30 Days Result Component Current Result Ref Range Previous Result Ref Range  BUN 23 (08/27/2024) 9 - 23 mg/dL 22 (0/87/7974) 9 - 23 mg/dL  Chloride 889 (H) (0/86/7974) 98 - 107 mmol/L 109 (H) (08/26/2024) 98 - 107 mmol/L  CO2 21.2 (08/27/2024) 20.0 - 31.0 mmol/L 21.4 (08/26/2024) 20.0 - 31.0 mmol/L  Creatinine 1.63 (H) (08/27/2024) 0.55 - 1.02 mg/dL 8.33 (H) (0/87/7974) 9.44 - 1.02 mg/dL  Glucose 846 (0/86/7974) 70 - 179 mg/dL 792 (H) (0/87/7974) 70 - 179 mg/dL  Potassium 3.4 (L) (0/86/7974) 3.5 - 5.1 mmol/L 3.7 (08/26/2024) 3.4 - 4.8 mmol/L  Sodium 147 (H) (08/27/2024) 135 - 145  mmol/L 145 (08/26/2024) 135 - 145 mmol/L    Relevant Studies/Radiology: CT Abdomen Pelvis  08/21/2024 1. Fluid in the lumen of the colon and rectum, corresponding to the reported clinical history of diarrhea. Mild multifocal colonic mural thickening is nonspecific but could reflect infectious colitis in this clinical setting. No evidence of mechanical bowel obstruction or perforation.  2. Similar size and appearance of multiloculated cystic pancreatic head mass measuring up to approximately 4.2 cm, better characterized on MRI abdomen 05/18/2024.  3. Diffuse mesenteric and body wall edema.  4. Hiatal hernia. Mural thickening of the distal esophagus; correlate with any evidence or history of reflux esophagitis.  5. Other chronic and incidental findings as noted above   ______________________________________________________________________ Discharge Instructions:  Activity Instructions     Activity as tolerated        Why was I admitted?  Why was I admitted?  You were admitted to the hospital for diarrhea and dehydration. At first, we wondered if this might be due  to an infection in your stomach, but the tests of your stool were negative. You received 5 total days of antibiotics. We think that the cause of your diarrhea is more likely related to inflammation in your colon related to your chemotherapy. We gave you fluids through the IV and helped replace some electrolytes that you likely lost with all of your diarrhea, and you improved significantly. We also followed your blood work, because when you got to the hospital your counts were low, and you required 2 transfusions of blood to help recover. Your labs look good now - and we feel confident that you are ready to discharge. We did get a test of the ulcers surrounding your bottom, and these were positive for a virus called HSV-2, or herpes simplex virus-2. It is likely that this virus was living dormant in your body, and because you started  chemotherapy and your immune system was suppressed, it allowed the virus to come to the surface. This was likely exacerbating the pain that you were having with bowel movements. We started a virus medicine for this, as discussed below.   After you leave the hospital, you are going to a rehab center. Once you are out of the rehab center, please see BOTH your primary care doctor AND your Oncologist to discuss your recent hospitalization and management moving forward.   Medications: Your new medication list is found in the After Visit Summary below.  If there are any additions, changes, or medications removed they are noted in this summary.   If you receive pre-packed Adherence Packaging (Bubble Packs) from your home pharmacy, please show this medication list to your home pharmacist so that they can update any changes that have been made.  For a list of side effects or what to expect with your medications, you may access medlineplus at: [email or web address].  Who do I contact if I have a question? If you have specific questions about these instructions, your medications or your hospital stay in the next 48 hours, please call [phone] and ask for the geriatrics doctor on call. Let them know that you were just discharged from the hospital.   For new symptoms or problems, call your primary care provider.   If you believe that this is a life-threatening emergency, call 911 or go to the emergency room.  Thank you for allowing us  to participate in your care.     Follow Up instructions and Outpatient Referrals    Call MD for:  difficulty breathing, headache or visual disturbances     Call MD for:  persistent nausea or vomiting     Call MD for:  severe uncontrolled pain     Call MD for:  temperature >38.5 Celsius     Discharge instructions       Appointments which have been scheduled for you    Aug 31, 2024 8:30 AM (Arrive by 8:15 AM) INFUSION ONLY with ONCINF HMOB CHAIR 17 [facility] [place] [place] [facility] REGION) [address] Floor [place] [address] [phone]     Sep 02, 2024 10:00 AM (Arrive by 9:45 AM) CT CHEST WO CONTRAST with HBR CT RM 2 IMG CT HBR [facility]) [address] [place] [address] [phone]  Let us  know if pt: Pregnant or nursing Claustrophobic  (Title:CTWOCNTRST)     Sep 02, 2024 10:30 AM (Arrive by 10:15 AM) MRI Abdomen With and Without Contrast with HBR MRI MBL IMG MRI [place] [facility])  [address] [place] [address] [phone]  On appointment date: Bring recent lab work Bring documentation of any metal object implants Take meds as usual Check with physician if diabetic You will be asked to change into a gown for your safety  On appointment date do not: Consume anything 4 hrs prior to procedure Wear metallic items including jewelry (we are not responsible for lost items)  Let us  know if patient: Claustrophobic Metal object implant Pregnant Prescribed a sedative On dialysis Allergic to MRI dye/contrast Kidney Failure    Sep 05, 2024 1:45 PM (Arrive by 1:15 PM) RETURN ACTIVE [facility] with Phebe Zelphia Cramp, MD [facility] SURGERY [place]OG[place] [facility] [place]) [address] [place] [address] [phone]     Sep 07, 2024 10:45 AM (Arrive by 10:30 AM) NURSE VISIT with ONCINF HMOB LABS [facility]Y [facility]ATOLOG[facility] [place] [facility] REGION) [address] [place] [address] [phone]     Sep 07, 2024 11:00 AM (Arrive by 10:45 AM) RETURN ACTIVE [facility] with Annye Merles, CPP [facility]OGY [place] [place] [facility] REGION) [address] [place] [address] [phone]     Sep 07, 2024 12:00 PM (Arrive by 11:45 AM) INFUSION W/ PRIOR APPT with ONCINF HMOB  CHAIR 07 [facility]OGY [facility] [place] [facility] REGION) [address] 1[address] Floor [place] [address] [phone]     Sep 07, 2024 1:00 PM RETURN NUTRITION with Rosina Lawrance [facility] ONCOLOGY NUTRITION SERVICES [facility] REGION) [address] [place] [address] [phone]     Sep 12, 2024 8:30 AM (Arrive by 8:15 AM) NURSE VISIT with ONCINF HMOB LABS [facility]OGY [facility] HEM[facility]OLOG[facility] REGION) [address] [place] [address] [phone]     Sep 14, 2024 2:00 PM (Arrive by 1:45 PM) INFUSION ONLY with ONCINF HMOB CHAIR 16 [facility] [place] [facility] REGION) [address] 1[address] [place] [address] [phone]     Sep 21, 2024 11:30 AM (Arrive by 11:15 AM) NURSE VISIT with ONCINF HMOB LABS [facility]OGY [facility] [place] [facility] REGION) [address] 1[address] Floor [place] [address] [phone]     Sep 21, 2024 12:30 PM (Arrive by 12:15 PM) INFUSION ONLY with ONCINF HMOB CHAIR 05 [facility]OGY [facility] [place] [facility] REGION) [address] 1[address] Floor [place] [address] [phone]     Sep 26, 2024 8:45 AM (Arrive by 8:30 AM) NURSE VISIT with ONCINF HMOB LABS [facility]OGY [place] [facility] [place] [facility] REGION) [address] Floor [place] [address] [phone]     Sep 28, 2024 8:30 AM (Arrive by 8:15 AM) INFUSION ONLY with ONCINF HMOB CHAIR 18 [facility]OGY [facility] [place] [facility] REGION) [address] 1[address] Floor [place] [address] [phone]     Oct 05, 2024 10:30 AM (Arrive by 10:15 AM) NURSE VISIT with ONCINF HMOB LABS [facility]  [facility]COLOGY [facility] [place] [facility] REGION) [address] 1[address] Floor [place] [address] [phone]     Oct 05, 2024 11:30 AM (Arrive by 11:15 AM) INFUSION ONLY with ONCINF HMOB CHAIR 16 [facility]OGY [facility] [place] [facility] REGION) [address] [facility] [address] 1st Floor [place] [address] [phone]     Oct 10, 2024 8:45 AM (Arrive by 8:30 AM) NURSE  VISIT with ONCINF HMOB LABS [facility] [place] [facility] REGION) [address] [name] [address] [phone]     Oct 19, 2024 11:00 AM (Arrive by 10:45 AM) NURSE VISIT with ONCINF HMOB LABS [facility]OGY [facility] [place] [facility] REGION) [address] Floor [place] [address] [phone]     Oct 19, 2024 12:00 PM (Arrive by 11:45 AM) INFUSION ONLY with ONCINF HMOB CHAIR 05 [facility]OGY [facility] [place] [facility] REGION) [address] Floor [place] [address] [phone]     Oct 24, 2024 8:00 AM (Arrive by 7:45 AM) NURSE VISIT with ONCINF HMOB LABS [facility]OGY [facility] [place] [facility] REGION) [address] Floor [place] [address] [phone]        ______________________________________________________________________ Discharge Day Services: BP 158/65   Pulse 72   Temp 36.7 C (98.1 F) (Oral)   Resp 18   Ht 170.2 cm (5' 7)   Wt 53 kg (116 lb 13.5 oz)   SpO2 100%   BMI 18.30 kg/m   Pt seen on the day of discharge and determined appropriate for discharge.  Condition at Discharge: good  Length of Discharge: I spent greater than 30 mins in the discharge of this patient.  Dale Ozell Ladora Arnell, MD  [facility] Internal Medicine PGY-1

## 2024-09-01 DIAGNOSIS — R600 Localized edema: Secondary | ICD-10-CM | POA: Diagnosis not present

## 2024-09-01 DIAGNOSIS — I10 Essential (primary) hypertension: Secondary | ICD-10-CM | POA: Diagnosis not present

## 2024-09-01 DIAGNOSIS — R29810 Facial weakness: Secondary | ICD-10-CM | POA: Diagnosis not present

## 2024-09-02 DIAGNOSIS — B370 Candidal stomatitis: Secondary | ICD-10-CM | POA: Diagnosis not present

## 2024-09-02 DIAGNOSIS — C250 Malignant neoplasm of head of pancreas: Secondary | ICD-10-CM | POA: Diagnosis not present

## 2024-09-02 DIAGNOSIS — K209 Esophagitis, unspecified without bleeding: Secondary | ICD-10-CM | POA: Diagnosis not present

## 2024-09-02 DIAGNOSIS — R188 Other ascites: Secondary | ICD-10-CM | POA: Diagnosis not present

## 2024-09-02 DIAGNOSIS — Z5111 Encounter for antineoplastic chemotherapy: Secondary | ICD-10-CM | POA: Diagnosis not present

## 2024-09-02 DIAGNOSIS — E278 Other specified disorders of adrenal gland: Secondary | ICD-10-CM | POA: Diagnosis not present

## 2024-09-02 DIAGNOSIS — R911 Solitary pulmonary nodule: Secondary | ICD-10-CM | POA: Diagnosis not present

## 2024-09-02 DIAGNOSIS — R630 Anorexia: Secondary | ICD-10-CM | POA: Diagnosis not present

## 2024-09-02 DIAGNOSIS — J811 Chronic pulmonary edema: Secondary | ICD-10-CM | POA: Diagnosis not present

## 2024-09-05 ENCOUNTER — Other Ambulatory Visit: Payer: Self-pay

## 2024-09-05 DIAGNOSIS — C250 Malignant neoplasm of head of pancreas: Secondary | ICD-10-CM | POA: Diagnosis not present

## 2024-09-05 DIAGNOSIS — R600 Localized edema: Secondary | ICD-10-CM | POA: Diagnosis not present

## 2024-09-05 DIAGNOSIS — Z8719 Personal history of other diseases of the digestive system: Secondary | ICD-10-CM | POA: Diagnosis not present

## 2024-09-05 DIAGNOSIS — C259 Malignant neoplasm of pancreas, unspecified: Secondary | ICD-10-CM | POA: Diagnosis not present

## 2024-09-05 NOTE — Progress Notes (Addendum)
 [facility] NOTE  Encounter Date: 09/07/2024  PCP: Valerio Melanie Colman ELNITA  Consulting Providers: Dr. WILKIE Cramp [facility]  ____________________________________________________________________ Oncology History:  Diagnosis: pancreas adenocarcinoma  Current Goal of Therapy: neoadjuvant  - 05/15/24 admitted to [facility] due to hyperglycemia, weight loss found on CT AP (w/o contrast) to have an ill defined panc head mass up to 3.5cm worrisome for pancreas malignancy, no metastatic disease noted  - 05/17/24 EUS showed a 2.3x2.2cm pancreas head mass s/p fine needle biopsy showing moderately differentiated adenocarcinoma - 05/18/24 CT chest showed an indeterminate 0.6cm RML lung nodule  - 05/18/24 MR AP showed a 4.6cm multiloculated cystic mass in pancreas head with association of main pacreas duct, numerous additional subcentimeter cystic lesions in pancreas, suboptimal study to evaluate resectability - 05/23/24 met with Dr. Cramp who notes the scan is suboptimal to determine resectability, will repeat short interval imaging. CA 19-9 97.78 - 06/10/24-current mFOLFIRINOX  - 06/10/24 DR oxaliplatin  to 65mg /m2 due to CKD, FOLFOX only, no G-CSF with C1  - 06/22/24 escalate to mFOLFIRINOX / add G-CSF  - 8/13 FOLFOX only due to diarrhe  - 8/27 resume mFOLFIRINOX, DR irinotecan  to 120mg /m2 to improve tolerance (C5)  - 08/24/24 C6 deferred due to admission for weakness   - 09/07/24 C6 deferred due to ongoing weakness, at SNF  - 09/21/24 resume C6, DR 5FU by 20% to improve tolerability   Molecular:  DPYD: normal metabolizer Tempus xG: negative ____________________________________________________________________ Assessment and Plan: Tonya [name] is a 78 y.o. female who presented for evaluation and recommendations regarding pancreas adenocarcinoma.   Stage IIA (cT3N0) pancreas head adenocarcinoma, at least borderline resectable : see oncology history as above for details. She is currently  on mFOLFIRINOX with plan for short interval repeat imaging to determine resectability due to poor imaging quality 05/18/24 though celiac axis and SMV/PV confluence are in close proximity.  Repeat MRI AP from 09/02/24 showed a slightly larger pancreas cystic mass with about 180 degree involvement of SV and PV confluence - ie borderline resectable disease with improvement in CA 19-9 from prior to treatment. Unfortunately, due to ongoing systemic therapy she has become very weak and does not have a good enough PS to tolerate C6 mFFX today. Saw Dr. Cramp on 09/05/24 who recommended another visit in about 3 months as she was not a candidate for surgery at that visit due to ongoing weakness and tumor only borderline resectable.  Today, we discussed several options moving forward as she and her husband had several very good questions: Break today to give her some more recovery time and resume C6 of chemotherapy in 2 weeks with a further dose reduction (drop 5FU to 80%). Discussed that ideally we would complete all 12 cycles in neoadjuvant setting followed by surgery. Did discuss that even with doing all of this there is a fair chance of recurrence, only 10% are alive and disease free at 5 years if N+. This would be the most aggressive approach Transition goals to be more palliative and consider definitive RT to her pancreas Transition to best supportive care   At this point, her and her husband agree that she is not certain she would tolerate surgery or further chemo but they are amenable to a break and return in 2 weeks to consider C6. We can continue to discuss overall goals along the way and can transition at any point.  - declined [facility] 1843  - hold C6 mFOLFIRINOX today with DR oxali to  65mg /m2 due to CKD (GFR ~15-20); return in 2 weeks to consider C6 and DR 5FU to 80% - low threshold to drop to FOLFOX or transition to gemcitabine/Abraxane if not tolerated.  - continue G-CSF, will try to get for home use if  possible  - CA 19-9 is informative, coming down nicely  - will plan to repeat imaging after completion of systemic therapy assuming she can tolerate this (MRI AP and CT chest)  - at any point can transition goals to best supportive care or definitive RT  Anal fissure with associated pain: improved but still having some pain.  - continue barrier cream such as Desitin to protect the area and aid healing. - warm sitz baths to relax the anal sphincter and improve blood flow. - lidocaine  jelly for topical pain relief. - topical nitroglycerin to increase blood flow and aid healing.  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 s/p 2U PRBCs on 07/01/24 and 2 units on 08/23/24 .  - ctm  - may need EPO in the future but will hold on this for now   G1 Peripheral Neuropathy: baseline due to T2DM. Having a bit of numbness in fingers.  - ctm closely and may need oxali DR pending how this is going   T2DM  Hyperglycemia: - continue lispro, Actos  - follow-up with endocrinology   HTN  HFpEF: - torsemide  40mg  daily,  - carvedilol  3.125mg  bid   RML Lung Nodule: noted on scans 05/18/24 and stable on scans 09/02/24. - ctm on follow-up imaging   CKD 4: baseline Cr is about 2-2.2. Stable today  - DR oxali to 65mg /m2 - avoid nephrotoxic medications - will continue to monitor  Supportive Care: CINV Prevention: - Dex 8mg  D2-4 - Zofran  8mg  bid D2-4 then PRN - Compazine PRN  Diarrhea --> constipation: - start senna 2 tabs bid and miralax  titrating to 1-2 formed BMs per day   Follow-Up: 10/8 for labs, Tonya [name], C6 10/22 for labs, Tonya [name] C7 11/5 for labs, Tonya [name], C8  This note was created with dictation software. Please excuse any transcription errors.  _____________________________________________________________________ History: Tonya [name] is a 78 y.o. female who presented as a return patient regarding pancreas adenocarcinoma.  History of Present Illness   Tonya [name] is a 78 year old female with borderline resectable pancreas adenocarcinoma who presents for follow-up.  She feels weak, particularly in her legs, which she attributes to a fall where she had to drag herself up, potentially causing injury. She is currently in rehab and expects to be there for another week. She anticipates being ready to go home with the aid of walkers.  Her appetite has been poor, but she is eating more than before and has gained five pounds since early September. No pain is reported, but she experiences constipation, with her last bowel movement occurring about a week ago. She has not been taking any medications for her bowels.  She experiences neuropathy in her toes and feet, which has not worsened. She also mentions a black spot on her foot, which her daughter noticed. She reports a fissure that is improving but still causes discomfort, especially with gas.  She was hospitalized two weeks ago and has been recovering since. No nausea or shortness of breath is reported. She feels hydrated and is working on regaining strength.       Past Medical History: CKD4 HTN T2DM HFpEF CVA in 2018  Social History: Lives in [place], [place] with her husband, Tonya [name].  Has four children. Used to work in [employer], fashion/clothing. No smoking or alcohol. Enjoys going to church and spending time with family, enjoys slot machines.   Review of Systems: A ten-point review of systems was conducted and negative unless stated in HPI.  Physical Exam: VITALS: BP 146/97   Pulse 83   Temp 36.3 C (97.4 F) (Temporal)   Resp 18   SpO2 100%  CONSTITUTIONAL: in NAD, appears stated age, examined in wheelchair HEENT: MMM, no oral lesions or exudates, neck supple CARDIO: normal rate, regular rhythm, no murmurs RESPIRATORY: CTAB b/l, normal WOB on RA, no conversational dyspnea GI: soft, mild TTP in epigastrium, no distension, no appreciable HSM EXTREMITIES: no LE edema SKIN: flat nodule on L heel NEUROLOGIC: alert  and oriented, no gross focal deficits noted  LYMPH: no supraclavicular, axillary, or cervical LAD PSYCH: appropriate mood and affect, good insight and judgment   Labs: Reviewed In Epic WBC 11 Hgb 8.6 Plt 435 Cr 1.62 Alb 1.7 AST 88 ALT 35 ALP 226  Imaging: I personally reviewed the following imaging studies and my interpretation is: MRI AP 09/02/24: - slight enlargement of cystic mass in pancreatic head with about 180 degree abutment of portal vein and SMV no suspicious regional adenopathy  CT Chest 09/02/24 - 6mm solid nodule in RML, stable from 05/2024 - anasarca

## 2024-09-05 NOTE — Progress Notes (Unsigned)
   09/05/2024  Patient ID: Tonya [name], female   DOB: August 04, 1946, 78 y.o.   MRN: [id number]  Patient outreach attempt to follow-up on management of diabetes.  I was not able to reach the patient but did leave HIPAA compliant voicemail with my direct phone number.  Hospital discharge notes from 9/15 state patient would be discharged to SNF.  Patient is scheduled to see endocrinology tomorrow, so I will review this note and try to check in with her again in a few weeks if appropriate/needed.  [name], PharmD, DPLA

## 2024-09-07 DIAGNOSIS — E1165 Type 2 diabetes mellitus with hyperglycemia: Secondary | ICD-10-CM | POA: Diagnosis not present

## 2024-09-07 DIAGNOSIS — D649 Anemia, unspecified: Secondary | ICD-10-CM | POA: Diagnosis not present

## 2024-09-07 DIAGNOSIS — E1122 Type 2 diabetes mellitus with diabetic chronic kidney disease: Secondary | ICD-10-CM | POA: Diagnosis not present

## 2024-09-07 DIAGNOSIS — Z79899 Other long term (current) drug therapy: Secondary | ICD-10-CM | POA: Diagnosis not present

## 2024-09-07 DIAGNOSIS — I130 Hypertensive heart and chronic kidney disease with heart failure and stage 1 through stage 4 chronic kidney disease, or unspecified chronic kidney disease: Secondary | ICD-10-CM | POA: Diagnosis not present

## 2024-09-07 DIAGNOSIS — C250 Malignant neoplasm of head of pancreas: Secondary | ICD-10-CM | POA: Diagnosis not present

## 2024-09-07 DIAGNOSIS — R531 Weakness: Secondary | ICD-10-CM | POA: Diagnosis not present

## 2024-09-07 DIAGNOSIS — N184 Chronic kidney disease, stage 4 (severe): Secondary | ICD-10-CM | POA: Diagnosis not present

## 2024-09-07 DIAGNOSIS — I5032 Chronic diastolic (congestive) heart failure: Secondary | ICD-10-CM | POA: Diagnosis not present

## 2024-09-08 ENCOUNTER — Telehealth: Payer: Self-pay

## 2024-09-08 DIAGNOSIS — L89152 Pressure ulcer of sacral region, stage 2: Secondary | ICD-10-CM | POA: Diagnosis not present

## 2024-09-08 DIAGNOSIS — I639 Cerebral infarction, unspecified: Secondary | ICD-10-CM | POA: Diagnosis not present

## 2024-09-08 DIAGNOSIS — E1140 Type 2 diabetes mellitus with diabetic neuropathy, unspecified: Secondary | ICD-10-CM | POA: Diagnosis not present

## 2024-09-08 DIAGNOSIS — M6281 Muscle weakness (generalized): Secondary | ICD-10-CM | POA: Diagnosis not present

## 2024-09-08 NOTE — Patient Instructions (Addendum)
 Tonya [name] - I am sorry I was unable to reach you today for our scheduled appointment. I work with [name], NP and am calling to support your healthcare needs. Please contact me at at your earliest convenience. I look forward to speaking with you soon.   Thank you,   Jackson Acron [facility] RN Care Manager Direct Dial: [phone]  Fax: [phone] Website: [email or web address]

## 2024-09-12 DIAGNOSIS — L89152 Pressure ulcer of sacral region, stage 2: Secondary | ICD-10-CM | POA: Diagnosis not present

## 2024-09-12 DIAGNOSIS — E1165 Type 2 diabetes mellitus with hyperglycemia: Secondary | ICD-10-CM | POA: Diagnosis not present

## 2024-09-12 DIAGNOSIS — R600 Localized edema: Secondary | ICD-10-CM | POA: Diagnosis not present

## 2024-09-12 DIAGNOSIS — E1122 Type 2 diabetes mellitus with diabetic chronic kidney disease: Secondary | ICD-10-CM | POA: Diagnosis not present

## 2024-09-14 ENCOUNTER — Telehealth: Payer: Self-pay

## 2024-09-14 NOTE — Progress Notes (Signed)
   09/14/2024  Patient ID: Blima FORBES Sharps, female   DOB: 1946-03-12, 78 y.o.   MRN: [id number]  Patient outreach to check in on management of diabetes.  Patient is currently at [facility] facility, and states she should be returning home next week.  I will attempt to reach out again in 2 weeks.  [name], PharmD, DPLA

## 2024-09-15 DIAGNOSIS — I499 Cardiac arrhythmia, unspecified: Secondary | ICD-10-CM | POA: Diagnosis not present

## 2024-09-15 DIAGNOSIS — M6281 Muscle weakness (generalized): Secondary | ICD-10-CM | POA: Diagnosis not present

## 2024-09-15 DIAGNOSIS — R1032 Left lower quadrant pain: Secondary | ICD-10-CM | POA: Diagnosis not present

## 2024-09-15 DIAGNOSIS — Z7982 Long term (current) use of aspirin: Secondary | ICD-10-CM | POA: Diagnosis not present

## 2024-09-15 DIAGNOSIS — E1140 Type 2 diabetes mellitus with diabetic neuropathy, unspecified: Secondary | ICD-10-CM | POA: Diagnosis not present

## 2024-09-15 DIAGNOSIS — L89152 Pressure ulcer of sacral region, stage 2: Secondary | ICD-10-CM | POA: Diagnosis not present

## 2024-09-15 DIAGNOSIS — I639 Cerebral infarction, unspecified: Secondary | ICD-10-CM | POA: Diagnosis not present

## 2024-09-19 DIAGNOSIS — N184 Chronic kidney disease, stage 4 (severe): Secondary | ICD-10-CM | POA: Diagnosis not present

## 2024-09-19 DIAGNOSIS — D631 Anemia in chronic kidney disease: Secondary | ICD-10-CM | POA: Diagnosis not present

## 2024-09-19 DIAGNOSIS — D72821 Monocytosis (symptomatic): Secondary | ICD-10-CM | POA: Diagnosis not present

## 2024-09-19 DIAGNOSIS — C259 Malignant neoplasm of pancreas, unspecified: Secondary | ICD-10-CM | POA: Diagnosis not present

## 2024-09-21 DIAGNOSIS — Z794 Long term (current) use of insulin: Secondary | ICD-10-CM | POA: Diagnosis not present

## 2024-09-21 DIAGNOSIS — I5032 Chronic diastolic (congestive) heart failure: Secondary | ICD-10-CM | POA: Diagnosis not present

## 2024-09-21 DIAGNOSIS — K602 Anal fissure, unspecified: Secondary | ICD-10-CM | POA: Diagnosis not present

## 2024-09-21 DIAGNOSIS — Z5111 Encounter for antineoplastic chemotherapy: Secondary | ICD-10-CM | POA: Diagnosis not present

## 2024-09-21 DIAGNOSIS — Z7984 Long term (current) use of oral hypoglycemic drugs: Secondary | ICD-10-CM | POA: Diagnosis not present

## 2024-09-21 DIAGNOSIS — E1122 Type 2 diabetes mellitus with diabetic chronic kidney disease: Secondary | ICD-10-CM | POA: Diagnosis not present

## 2024-09-21 DIAGNOSIS — Z79899 Other long term (current) drug therapy: Secondary | ICD-10-CM | POA: Diagnosis not present

## 2024-09-21 DIAGNOSIS — D630 Anemia in neoplastic disease: Secondary | ICD-10-CM | POA: Diagnosis not present

## 2024-09-21 DIAGNOSIS — N184 Chronic kidney disease, stage 4 (severe): Secondary | ICD-10-CM | POA: Diagnosis not present

## 2024-09-21 DIAGNOSIS — C250 Malignant neoplasm of head of pancreas: Secondary | ICD-10-CM | POA: Diagnosis not present

## 2024-09-21 DIAGNOSIS — Z8673 Personal history of transient ischemic attack (TIA), and cerebral infarction without residual deficits: Secondary | ICD-10-CM | POA: Diagnosis not present

## 2024-09-21 DIAGNOSIS — R29818 Other symptoms and signs involving the nervous system: Secondary | ICD-10-CM | POA: Diagnosis not present

## 2024-09-21 DIAGNOSIS — E1142 Type 2 diabetes mellitus with diabetic polyneuropathy: Secondary | ICD-10-CM | POA: Diagnosis not present

## 2024-09-21 DIAGNOSIS — I130 Hypertensive heart and chronic kidney disease with heart failure and stage 1 through stage 4 chronic kidney disease, or unspecified chronic kidney disease: Secondary | ICD-10-CM | POA: Diagnosis not present

## 2024-09-21 DIAGNOSIS — Z7982 Long term (current) use of aspirin: Secondary | ICD-10-CM | POA: Diagnosis not present

## 2024-09-23 DIAGNOSIS — I499 Cardiac arrhythmia, unspecified: Secondary | ICD-10-CM | POA: Diagnosis not present

## 2024-09-23 DIAGNOSIS — Z5111 Encounter for antineoplastic chemotherapy: Secondary | ICD-10-CM | POA: Diagnosis not present

## 2024-09-23 DIAGNOSIS — Z7982 Long term (current) use of aspirin: Secondary | ICD-10-CM | POA: Diagnosis not present

## 2024-09-23 DIAGNOSIS — R1012 Left upper quadrant pain: Secondary | ICD-10-CM | POA: Diagnosis not present

## 2024-09-24 DIAGNOSIS — M4807 Spinal stenosis, lumbosacral region: Secondary | ICD-10-CM | POA: Diagnosis not present

## 2024-09-24 DIAGNOSIS — M47816 Spondylosis without myelopathy or radiculopathy, lumbar region: Secondary | ICD-10-CM | POA: Diagnosis not present

## 2024-09-24 DIAGNOSIS — R29818 Other symptoms and signs involving the nervous system: Secondary | ICD-10-CM | POA: Diagnosis not present

## 2024-09-24 DIAGNOSIS — M48061 Spinal stenosis, lumbar region without neurogenic claudication: Secondary | ICD-10-CM | POA: Diagnosis not present

## 2024-09-26 DIAGNOSIS — I129 Hypertensive chronic kidney disease with stage 1 through stage 4 chronic kidney disease, or unspecified chronic kidney disease: Secondary | ICD-10-CM | POA: Diagnosis not present

## 2024-09-26 DIAGNOSIS — R1032 Left lower quadrant pain: Secondary | ICD-10-CM | POA: Diagnosis not present

## 2024-09-26 DIAGNOSIS — E1165 Type 2 diabetes mellitus with hyperglycemia: Secondary | ICD-10-CM | POA: Diagnosis not present

## 2024-09-26 DIAGNOSIS — I499 Cardiac arrhythmia, unspecified: Secondary | ICD-10-CM | POA: Diagnosis not present

## 2024-09-26 DIAGNOSIS — C250 Malignant neoplasm of head of pancreas: Secondary | ICD-10-CM | POA: Diagnosis not present

## 2024-09-26 DIAGNOSIS — Z79899 Other long term (current) drug therapy: Secondary | ICD-10-CM | POA: Diagnosis not present

## 2024-09-28 DIAGNOSIS — M160 Bilateral primary osteoarthritis of hip: Secondary | ICD-10-CM | POA: Diagnosis not present

## 2024-09-28 DIAGNOSIS — C250 Malignant neoplasm of head of pancreas: Secondary | ICD-10-CM | POA: Diagnosis not present

## 2024-09-28 DIAGNOSIS — R29818 Other symptoms and signs involving the nervous system: Secondary | ICD-10-CM | POA: Diagnosis not present

## 2024-09-29 DIAGNOSIS — R29898 Other symptoms and signs involving the musculoskeletal system: Secondary | ICD-10-CM | POA: Diagnosis not present

## 2024-09-30 DIAGNOSIS — R1032 Left lower quadrant pain: Secondary | ICD-10-CM | POA: Diagnosis not present

## 2024-09-30 DIAGNOSIS — E1122 Type 2 diabetes mellitus with diabetic chronic kidney disease: Secondary | ICD-10-CM | POA: Diagnosis not present

## 2024-09-30 DIAGNOSIS — I129 Hypertensive chronic kidney disease with stage 1 through stage 4 chronic kidney disease, or unspecified chronic kidney disease: Secondary | ICD-10-CM | POA: Diagnosis not present

## 2024-09-30 DIAGNOSIS — C259 Malignant neoplasm of pancreas, unspecified: Secondary | ICD-10-CM | POA: Diagnosis not present

## 2024-10-02 DIAGNOSIS — R100 Acute abdomen: Secondary | ICD-10-CM | POA: Diagnosis not present

## 2024-10-03 ENCOUNTER — Other Ambulatory Visit: Payer: Self-pay

## 2024-10-03 DIAGNOSIS — N1330 Unspecified hydronephrosis: Secondary | ICD-10-CM | POA: Diagnosis not present

## 2024-10-03 DIAGNOSIS — K254 Chronic or unspecified gastric ulcer with hemorrhage: Secondary | ICD-10-CM | POA: Diagnosis not present

## 2024-10-03 DIAGNOSIS — E11649 Type 2 diabetes mellitus with hypoglycemia without coma: Secondary | ICD-10-CM | POA: Diagnosis not present

## 2024-10-03 DIAGNOSIS — I82512 Chronic embolism and thrombosis of left femoral vein: Secondary | ICD-10-CM | POA: Diagnosis not present

## 2024-10-03 DIAGNOSIS — I129 Hypertensive chronic kidney disease with stage 1 through stage 4 chronic kidney disease, or unspecified chronic kidney disease: Secondary | ICD-10-CM | POA: Diagnosis not present

## 2024-10-03 DIAGNOSIS — D61810 Antineoplastic chemotherapy induced pancytopenia: Secondary | ICD-10-CM | POA: Diagnosis not present

## 2024-10-03 DIAGNOSIS — A414 Sepsis due to anaerobes: Secondary | ICD-10-CM | POA: Diagnosis not present

## 2024-10-03 DIAGNOSIS — K920 Hematemesis: Secondary | ICD-10-CM | POA: Diagnosis not present

## 2024-10-03 DIAGNOSIS — I10 Essential (primary) hypertension: Secondary | ICD-10-CM | POA: Diagnosis not present

## 2024-10-03 DIAGNOSIS — K8689 Other specified diseases of pancreas: Secondary | ICD-10-CM | POA: Diagnosis not present

## 2024-10-03 DIAGNOSIS — D72829 Elevated white blood cell count, unspecified: Secondary | ICD-10-CM | POA: Diagnosis not present

## 2024-10-03 DIAGNOSIS — C250 Malignant neoplasm of head of pancreas: Secondary | ICD-10-CM | POA: Diagnosis not present

## 2024-10-03 DIAGNOSIS — D649 Anemia, unspecified: Secondary | ICD-10-CM | POA: Diagnosis not present

## 2024-10-03 DIAGNOSIS — K922 Gastrointestinal hemorrhage, unspecified: Secondary | ICD-10-CM | POA: Diagnosis not present

## 2024-10-03 DIAGNOSIS — E119 Type 2 diabetes mellitus without complications: Secondary | ICD-10-CM | POA: Diagnosis not present

## 2024-10-03 DIAGNOSIS — Z794 Long term (current) use of insulin: Secondary | ICD-10-CM | POA: Diagnosis not present

## 2024-10-03 DIAGNOSIS — K449 Diaphragmatic hernia without obstruction or gangrene: Secondary | ICD-10-CM | POA: Diagnosis not present

## 2024-10-03 DIAGNOSIS — B370 Candidal stomatitis: Secondary | ICD-10-CM | POA: Diagnosis not present

## 2024-10-03 DIAGNOSIS — I5032 Chronic diastolic (congestive) heart failure: Secondary | ICD-10-CM | POA: Diagnosis not present

## 2024-10-03 DIAGNOSIS — I959 Hypotension, unspecified: Secondary | ICD-10-CM | POA: Diagnosis not present

## 2024-10-03 DIAGNOSIS — E785 Hyperlipidemia, unspecified: Secondary | ICD-10-CM | POA: Diagnosis not present

## 2024-10-03 DIAGNOSIS — K921 Melena: Secondary | ICD-10-CM | POA: Diagnosis not present

## 2024-10-03 DIAGNOSIS — K632 Fistula of intestine: Secondary | ICD-10-CM | POA: Diagnosis not present

## 2024-10-03 DIAGNOSIS — Z792 Long term (current) use of antibiotics: Secondary | ICD-10-CM | POA: Diagnosis not present

## 2024-10-03 DIAGNOSIS — E1140 Type 2 diabetes mellitus with diabetic neuropathy, unspecified: Secondary | ICD-10-CM | POA: Diagnosis not present

## 2024-10-03 DIAGNOSIS — L02211 Cutaneous abscess of abdominal wall: Secondary | ICD-10-CM | POA: Diagnosis not present

## 2024-10-03 DIAGNOSIS — E43 Unspecified severe protein-calorie malnutrition: Secondary | ICD-10-CM | POA: Diagnosis not present

## 2024-10-03 DIAGNOSIS — K5720 Diverticulitis of large intestine with perforation and abscess without bleeding: Secondary | ICD-10-CM | POA: Diagnosis not present

## 2024-10-03 DIAGNOSIS — R918 Other nonspecific abnormal finding of lung field: Secondary | ICD-10-CM | POA: Diagnosis not present

## 2024-10-03 DIAGNOSIS — N184 Chronic kidney disease, stage 4 (severe): Secondary | ICD-10-CM | POA: Diagnosis not present

## 2024-10-03 DIAGNOSIS — E46 Unspecified protein-calorie malnutrition: Secondary | ICD-10-CM | POA: Diagnosis not present

## 2024-10-03 DIAGNOSIS — Z66 Do not resuscitate: Secondary | ICD-10-CM | POA: Diagnosis not present

## 2024-10-03 DIAGNOSIS — E872 Acidosis, unspecified: Secondary | ICD-10-CM | POA: Diagnosis not present

## 2024-10-03 DIAGNOSIS — R933 Abnormal findings on diagnostic imaging of other parts of digestive tract: Secondary | ICD-10-CM | POA: Diagnosis not present

## 2024-10-03 DIAGNOSIS — Z9189 Other specified personal risk factors, not elsewhere classified: Secondary | ICD-10-CM | POA: Diagnosis not present

## 2024-10-03 DIAGNOSIS — R338 Other retention of urine: Secondary | ICD-10-CM | POA: Diagnosis not present

## 2024-10-03 DIAGNOSIS — E1122 Type 2 diabetes mellitus with diabetic chronic kidney disease: Secondary | ICD-10-CM | POA: Diagnosis not present

## 2024-10-03 DIAGNOSIS — Z8507 Personal history of malignant neoplasm of pancreas: Secondary | ICD-10-CM | POA: Diagnosis not present

## 2024-10-03 DIAGNOSIS — R197 Diarrhea, unspecified: Secondary | ICD-10-CM | POA: Diagnosis not present

## 2024-10-03 DIAGNOSIS — Z515 Encounter for palliative care: Secondary | ICD-10-CM | POA: Diagnosis not present

## 2024-10-03 DIAGNOSIS — A4159 Other Gram-negative sepsis: Secondary | ICD-10-CM | POA: Diagnosis not present

## 2024-10-03 DIAGNOSIS — Z681 Body mass index (BMI) 19 or less, adult: Secondary | ICD-10-CM | POA: Diagnosis not present

## 2024-10-03 DIAGNOSIS — G709 Myoneural disorder, unspecified: Secondary | ICD-10-CM | POA: Diagnosis not present

## 2024-10-03 DIAGNOSIS — R6520 Severe sepsis without septic shock: Secondary | ICD-10-CM | POA: Diagnosis not present

## 2024-10-03 DIAGNOSIS — A0471 Enterocolitis due to Clostridium difficile, recurrent: Secondary | ICD-10-CM | POA: Diagnosis not present

## 2024-10-03 DIAGNOSIS — A6000 Herpesviral infection of urogenital system, unspecified: Secondary | ICD-10-CM | POA: Diagnosis not present

## 2024-10-03 DIAGNOSIS — I130 Hypertensive heart and chronic kidney disease with heart failure and stage 1 through stage 4 chronic kidney disease, or unspecified chronic kidney disease: Secondary | ICD-10-CM | POA: Diagnosis not present

## 2024-10-03 DIAGNOSIS — Z8673 Personal history of transient ischemic attack (TIA), and cerebral infarction without residual deficits: Secondary | ICD-10-CM | POA: Diagnosis not present

## 2024-10-03 DIAGNOSIS — R509 Fever, unspecified: Secondary | ICD-10-CM | POA: Diagnosis not present

## 2024-10-03 DIAGNOSIS — K2210 Ulcer of esophagus without bleeding: Secondary | ICD-10-CM | POA: Diagnosis not present

## 2024-10-03 DIAGNOSIS — D735 Infarction of spleen: Secondary | ICD-10-CM | POA: Diagnosis not present

## 2024-10-03 DIAGNOSIS — L089 Local infection of the skin and subcutaneous tissue, unspecified: Secondary | ICD-10-CM | POA: Diagnosis not present

## 2024-10-03 DIAGNOSIS — N179 Acute kidney failure, unspecified: Secondary | ICD-10-CM | POA: Diagnosis not present

## 2024-10-03 DIAGNOSIS — N1831 Chronic kidney disease, stage 3a: Secondary | ICD-10-CM | POA: Diagnosis not present

## 2024-10-03 DIAGNOSIS — R339 Retention of urine, unspecified: Secondary | ICD-10-CM | POA: Diagnosis not present

## 2024-10-03 DIAGNOSIS — K2980 Duodenitis without bleeding: Secondary | ICD-10-CM | POA: Diagnosis not present

## 2024-10-05 DIAGNOSIS — A419 Sepsis, unspecified organism: Secondary | ICD-10-CM | POA: Insufficient documentation

## 2024-10-05 DIAGNOSIS — A0472 Enterocolitis due to Clostridium difficile, not specified as recurrent: Secondary | ICD-10-CM | POA: Insufficient documentation

## 2024-10-14 ENCOUNTER — Telehealth: Payer: Self-pay | Admitting: Nurse Practitioner

## 2024-10-14 NOTE — Telephone Encounter (Signed)
 Copied from CRM #[id number]. Topic: General - Other >> Oct 14, 2024 11:17 AM Montie POUR wrote: Reason for CRM:  [facility] faxed over a form for NP Cannady to sign off on Tonya [name] to receive Personal Care Services. She will discharge next week from the hospital. Please call [name] back at [phone]. She wants to see if the fax was received.

## 2024-10-14 NOTE — Telephone Encounter (Signed)
 I went ahead and changed her next appt to a hospital follow up.

## 2024-10-17 NOTE — Progress Notes (Unsigned)
   10/17/2024  Patient ID: Tonya [name], female   DOB: 05-26-1946, 78 y.o.   MRN: [id number]  Subjective/objective Telephone outreach to follow-up on management of diabetes   Diabetes -Current medications:  Basaglar 4 units at bedtime, Humalog 8 units TID before meals, pioglitazone  30mg  daily -Farxiga recently stopped by nephrology -Patient using Freestyle Libre for CGM -Endorses FBG usually 140-170; BG during phone call was 185, and this was approximately 3 hours after lunch -Does endorse FBG in the 60's, but this came up after having a Glucerna shake -Patient is followed by Dr. Damian with [facility]) -Last A1c was 11.9% on 6/1  Hypertension -Current medications:  carvedilol  3.125mg  BID, olmesartan  40mg  daily, torsemide  20mg  daily -Patient has only been taking carvedilol  once daily; was not aware this was to be taken BID -Endorses new swelling in both feet/ankles.  Does not endorse any SOB or chest pain.  Is taking torsemide  20mg  daily. -Does not monitor home BP but states BP always normal at office visits and chemo treatments -Last documented office BP of 119/78 on 7/16  Assessment/plan   Diabetes -Continue current regimen at this time -Continue to use Freestyle Libre for CGM -Patient sees endocrinology again 9/23 and will be due for A1c  Hypertension -Begin taking carvedilol  3.125mg  BID, continue olmesartan  40mg  daily and torsemide  20mg  daily -Patient sees PCP again in November, but appears to have missed a visit earlier this month- will notify about swelling in feet/ankles to see if they would like to see patient sooner   Follow-up: 4 weeks   [name], PharmD, DPLA

## 2024-10-18 ENCOUNTER — Other Ambulatory Visit: Payer: Self-pay

## 2024-10-18 NOTE — Telephone Encounter (Signed)
 Paperwork not yet received. Will be routed to PCP when it is. Tried to reach caller and advise but did not reach her.

## 2024-10-20 ENCOUNTER — Encounter: Payer: Self-pay | Admitting: Oncology

## 2024-10-20 ENCOUNTER — Telehealth: Payer: Self-pay

## 2024-10-20 NOTE — Telephone Encounter (Signed)
 Paperwork received from [facility] to complete [id number] however patient does not have full medicaid rather only coverage to help cover her Part B monthly premium. I have notified [facility] that we will not proceed with completing form at this time as it would be denied.

## 2024-10-23 NOTE — Patient Instructions (Incomplete)

## 2024-10-26 ENCOUNTER — Inpatient Hospital Stay: Admitting: Nurse Practitioner

## 2024-10-26 ENCOUNTER — Telehealth: Payer: Self-pay | Admitting: Nurse Practitioner

## 2024-10-26 NOTE — Telephone Encounter (Signed)
 Copied from CRM [id number]. Topic: General - Other >> Oct 26, 2024 11:52 AM Tobias CROME wrote: Reason for CRM: Ms. Katrine requesting call from [name]. Patient has been hospitalized at [facility] and has had 3 surgeries.   Requesting callback: [phone]

## 2024-10-28 NOTE — Telephone Encounter (Signed)
 Paperwork received from [facility] to complete [id number] however patient does not have full medicaid rather only coverage to help cover her Part B monthly premium. I have notified [facility] that we will not proceed with completing form at this time as it would be denied.   This is my last note. I'll have admin recheck and verify her medicaid coverage again.

## 2024-10-31 NOTE — Nursing Note (Signed)
   Care Management Reassessment   Estimated Discharge Date: 10/31/2024  Current Discharge Plan: Other: See notes below  Anticipated Changes: See notes below  Coordination of Care and Care Progression Notes: GOC meeting was this past Friday. CM to follow up with providers regarding this and any new needs.

## 2024-11-11 ENCOUNTER — Other Ambulatory Visit: Payer: Self-pay

## 2024-11-11 NOTE — Patient Outreach (Signed)
 Notification Received: Patient has transitioned to hospice care

## 2025-01-17 ENCOUNTER — Telehealth: Payer: Self-pay | Admitting: Nurse Practitioner

## 2025-01-17 NOTE — Telephone Encounter (Signed)
 Contacted [facility] and advised them that [name] has not received anything for this patient via LARAYNE [place]. The lady I spoke with states that Dr. Maree signed off on this patient's death certificate on [facility] yesterday.

## 2025-02-28 ENCOUNTER — Ambulatory Visit
